# Patient Record
Sex: Male | Born: 1955 | Race: White | Hispanic: No | Marital: Married | Smoking: Never smoker
Health system: Southern US, Community
[De-identification: ages and names within clinical notes are randomized; demographics above are authoritative.]

## PROBLEM LIST (undated history)

## (undated) DIAGNOSIS — E890 Postprocedural hypothyroidism: Secondary | ICD-10-CM

## (undated) DIAGNOSIS — I712 Thoracic aortic aneurysm, without rupture, unspecified: Secondary | ICD-10-CM

## (undated) DIAGNOSIS — J189 Pneumonia, unspecified organism: Secondary | ICD-10-CM

## (undated) DIAGNOSIS — I10 Essential (primary) hypertension: Secondary | ICD-10-CM

## (undated) DIAGNOSIS — I639 Cerebral infarction, unspecified: Secondary | ICD-10-CM

## (undated) DIAGNOSIS — K5909 Other constipation: Secondary | ICD-10-CM

## (undated) DIAGNOSIS — E119 Type 2 diabetes mellitus without complications: Secondary | ICD-10-CM

## (undated) DIAGNOSIS — N201 Calculus of ureter: Secondary | ICD-10-CM

## (undated) DIAGNOSIS — M1990 Unspecified osteoarthritis, unspecified site: Secondary | ICD-10-CM

## (undated) DIAGNOSIS — Z86010 Personal history of colonic polyps: Secondary | ICD-10-CM

## (undated) DIAGNOSIS — I219 Acute myocardial infarction, unspecified: Secondary | ICD-10-CM

## (undated) DIAGNOSIS — R000 Tachycardia, unspecified: Secondary | ICD-10-CM

## (undated) DIAGNOSIS — T7840XA Allergy, unspecified, initial encounter: Secondary | ICD-10-CM

## (undated) DIAGNOSIS — R0789 Other chest pain: Secondary | ICD-10-CM

## (undated) DIAGNOSIS — E785 Hyperlipidemia, unspecified: Secondary | ICD-10-CM

## (undated) DIAGNOSIS — J3800 Paralysis of vocal cords and larynx, unspecified: Secondary | ICD-10-CM

## (undated) DIAGNOSIS — J302 Other seasonal allergic rhinitis: Secondary | ICD-10-CM

## (undated) DIAGNOSIS — N189 Chronic kidney disease, unspecified: Secondary | ICD-10-CM

## (undated) HISTORY — PX: UPPER GASTROINTESTINAL ENDOSCOPY: SHX188

## (undated) HISTORY — DX: Unspecified osteoarthritis, unspecified site: M19.90

## (undated) HISTORY — DX: Hyperlipidemia, unspecified: E78.5

## (undated) HISTORY — DX: Chronic kidney disease, unspecified: N18.9

## (undated) HISTORY — DX: Personal history of colonic polyps: Z86.010

## (undated) HISTORY — PX: OTHER SURGICAL HISTORY: SHX169

## (undated) HISTORY — DX: Other chest pain: R07.89

## (undated) HISTORY — PX: COLONOSCOPY W/ POLYPECTOMY: SHX1380

## (undated) HISTORY — DX: Thoracic aortic aneurysm, without rupture: I71.2

## (undated) HISTORY — DX: Thoracic aortic aneurysm, without rupture, unspecified: I71.20

## (undated) HISTORY — DX: Type 2 diabetes mellitus without complications: E11.9

## (undated) HISTORY — DX: Paralysis of vocal cords and larynx, unspecified: J38.00

## (undated) HISTORY — DX: Essential (primary) hypertension: I10

## (undated) HISTORY — DX: Postprocedural hypothyroidism: E89.0

## (undated) HISTORY — DX: Tachycardia, unspecified: R00.0

## (undated) HISTORY — PX: LASIK: SHX215

## (undated) HISTORY — DX: Acute myocardial infarction, unspecified: I21.9

## (undated) HISTORY — DX: Calculus of ureter: N20.1

## (undated) HISTORY — DX: Allergy, unspecified, initial encounter: T78.40XA

## (undated) HISTORY — DX: Other seasonal allergic rhinitis: J30.2

## (undated) HISTORY — DX: Other constipation: K59.09

## (undated) HISTORY — PX: EYE SURGERY: SHX253

---

## 1978-02-14 DIAGNOSIS — R000 Tachycardia, unspecified: Secondary | ICD-10-CM

## 1978-02-14 HISTORY — PX: CARDIAC CATHETERIZATION: SHX172

## 1978-02-14 HISTORY — DX: Tachycardia, unspecified: R00.0

## 2002-09-02 ENCOUNTER — Encounter: Admission: RE | Admit: 2002-09-02 | Discharge: 2002-09-02 | Payer: Self-pay | Admitting: Internal Medicine

## 2002-09-02 ENCOUNTER — Encounter: Payer: Self-pay | Admitting: Internal Medicine

## 2003-03-27 ENCOUNTER — Encounter: Payer: Self-pay | Admitting: Internal Medicine

## 2003-04-07 ENCOUNTER — Encounter: Payer: Self-pay | Admitting: Internal Medicine

## 2003-04-07 ENCOUNTER — Encounter: Admission: RE | Admit: 2003-04-07 | Discharge: 2003-04-07 | Payer: Self-pay | Admitting: Internal Medicine

## 2003-04-21 ENCOUNTER — Encounter: Payer: Self-pay | Admitting: Internal Medicine

## 2003-04-21 ENCOUNTER — Encounter: Admission: RE | Admit: 2003-04-21 | Discharge: 2003-04-21 | Payer: Self-pay | Admitting: Internal Medicine

## 2004-05-18 ENCOUNTER — Ambulatory Visit: Payer: Self-pay | Admitting: Internal Medicine

## 2005-04-27 ENCOUNTER — Ambulatory Visit: Payer: Self-pay | Admitting: Internal Medicine

## 2009-05-05 ENCOUNTER — Ambulatory Visit: Payer: Self-pay | Admitting: Internal Medicine

## 2009-05-05 DIAGNOSIS — N419 Inflammatory disease of prostate, unspecified: Secondary | ICD-10-CM | POA: Insufficient documentation

## 2009-05-05 DIAGNOSIS — M545 Low back pain, unspecified: Secondary | ICD-10-CM | POA: Insufficient documentation

## 2009-05-05 DIAGNOSIS — Z8719 Personal history of other diseases of the digestive system: Secondary | ICD-10-CM | POA: Insufficient documentation

## 2009-05-05 DIAGNOSIS — R319 Hematuria, unspecified: Secondary | ICD-10-CM | POA: Insufficient documentation

## 2009-05-05 LAB — CONVERTED CEMR LAB
Nitrite: NEGATIVE
Specific Gravity, Urine: 1.025
Urobilinogen, UA: 0.2
WBC Urine, dipstick: NEGATIVE

## 2009-05-11 LAB — CONVERTED CEMR LAB
Basophils Relative: 6.2 % — ABNORMAL HIGH (ref 0.0–3.0)
Hemoglobin: 14.1 g/dL (ref 13.0–17.0)
Lymphocytes Relative: 52.2 % — ABNORMAL HIGH (ref 12.0–46.0)
MCHC: 34.1 g/dL (ref 30.0–36.0)
MCV: 94.3 fL (ref 78.0–100.0)
Monocytes Absolute: 0.6 10*3/uL (ref 0.1–1.0)
Neutro Abs: 1 10*3/uL — ABNORMAL LOW (ref 1.4–7.7)
Neutrophils Relative %: 24.5 % — ABNORMAL LOW (ref 43.0–77.0)
PSA: 1.03 ng/mL (ref 0.10–4.00)
RBC: 4.39 M/uL (ref 4.22–5.81)
RDW: 12.5 % (ref 11.5–14.6)

## 2009-05-14 ENCOUNTER — Ambulatory Visit: Payer: Self-pay | Admitting: Internal Medicine

## 2009-05-14 DIAGNOSIS — K921 Melena: Secondary | ICD-10-CM | POA: Insufficient documentation

## 2009-05-14 LAB — CONVERTED CEMR LAB
OCCULT 1: POSITIVE
OCCULT 3: NEGATIVE

## 2009-05-15 ENCOUNTER — Encounter (INDEPENDENT_AMBULATORY_CARE_PROVIDER_SITE_OTHER): Payer: Self-pay | Admitting: *Deleted

## 2009-06-17 ENCOUNTER — Encounter (INDEPENDENT_AMBULATORY_CARE_PROVIDER_SITE_OTHER): Payer: Self-pay | Admitting: *Deleted

## 2009-06-17 ENCOUNTER — Ambulatory Visit: Payer: Self-pay | Admitting: Internal Medicine

## 2009-07-02 ENCOUNTER — Ambulatory Visit: Payer: Self-pay | Admitting: Internal Medicine

## 2009-07-02 DIAGNOSIS — Z86010 Personal history of colonic polyps: Secondary | ICD-10-CM

## 2009-07-02 DIAGNOSIS — Z860101 Personal history of adenomatous and serrated colon polyps: Secondary | ICD-10-CM

## 2009-07-02 HISTORY — PX: COLONOSCOPY W/ POLYPECTOMY: SHX1380

## 2009-07-02 HISTORY — DX: Personal history of colonic polyps: Z86.010

## 2009-07-02 HISTORY — DX: Personal history of adenomatous and serrated colon polyps: Z86.0101

## 2009-07-02 HISTORY — PX: ESOPHAGOGASTRODUODENOSCOPY: SHX1529

## 2009-07-09 ENCOUNTER — Encounter: Payer: Self-pay | Admitting: Internal Medicine

## 2010-03-18 NOTE — Procedures (Signed)
Summary: Upper Endoscopy  Patient: Timothy Page Note: All result statuses are Final unless otherwise noted.  Tests: (1) Upper Endoscopy (EGD)   EGD Upper Endoscopy       DONE     [facility]     [address]     [place], [place]  [address]           ENDOSCOPY PROCEDURE REPORT           PATIENT:  [name], Gains  MR#:  [id number]     BIRTHDATE:  May 13, 1955, 53 yrs. old  GENDER:  male           ENDOSCOPIST:  Iva Boop, MD, [facility]     Referred by:  Marga Melnick, M.D.           PROCEDURE DATE:  07/02/2009     PROCEDURE:  EGD, diagnostic     ASA CLASS:  Class I     INDICATIONS:  FOBT + stool, melena Transient melena by history     then heme + stool found.           MEDICATIONS:   Fentanyl 50 mcg IV, Versed 5 mg IV     TOPICAL ANESTHETIC:  Exactacain Spray           DESCRIPTION OF PROCEDURE:   After the risks benefits and     alternatives of the procedure were thoroughly explained, informed     consent was obtained.  The LB GIF-H180 [id number] endoscope was     introduced through the mouth and advanced to the second portion of     the duodenum, without limitations.  The instrument was slowly     withdrawn as the mucosa was fully examined.     <<PROCEDUREIMAGES>>           The upper, middle, and distal third of the esophagus were     carefully inspected and no abnormalities were noted. The z-line     was well seen at the GEJ. The endoscope was pushed into the fundus     which was normal including a retroflexed view. The antrum,gastric     body, first and second part of the duodenum were unremarkable.     Retroflexed views revealed no abnormalities.    The scope was then     withdrawn from the patient and the procedure completed.           COMPLICATIONS:  None           ENDOSCOPIC IMPRESSION:     1) Normal EGD     RECOMMENDATIONS:     colonoscopy next           REPEAT EXAM:  In for as needed.           Iva Boop, MD, [facility]           CC:  Pecola Lawless, MD     The Patient           n.     Rosalie Doctor:   Iva Boop at 07/02/2009 03:22 PM           [name], [id number]  Note: An exclamation mark (!) indicates a result that was not dispersed into the flowsheet. Document Creation Date: 07/02/2009 5:08 PM _______________________________________________________________________  (1) Order result status: Final Collection or observation date-time: 07/02/2009 14:56 Requested date-time:  Receipt date-time:  Reported date-time:  Referring Physician:   Ordering Physician: Stan Head [phone]) Specimen Source:  Source: Launa Grill Order Number: [id number] Lab site:

## 2010-03-18 NOTE — Assessment & Plan Note (Signed)
Summary: HX OF MELENA/POS STOOLS/YF   History of Present Illness Visit Type: Initial Consult Primary GI MD: Stan Head MD [facility] Primary Provider: Marga Melnick, MD Requesting Provider: Marga Melnick, MD Chief Complaint: hx of melena History of Present Illness:   55 yo white man with transient dark stool and also had blood in his urine. Subsequent 1/6 hemoccult cards were +. One stool that was "like tar" in March, had probably been using Aleve, 4-5 in a week. He has a "bad shoulder", right, chronic issue related to softball throwing over many years. also with other arthritic joints.   GI Review of Systems      Denies abdominal pain, acid reflux, belching, bloating, chest pain, dysphagia with liquids, dysphagia with solids, heartburn, loss of appetite, nausea, vomiting, vomiting blood, weight loss, and  weight gain.      Reports black tarry stools and  heme positive stool.     Denies anal fissure, change in bowel habit, constipation, diarrhea, diverticulosis, fecal incontinence, hemorrhoids, irritable bowel syndrome, jaundice, light color stool, liver problems, rectal bleeding, and  rectal pain.    Current Medications (verified): 1)  Claritin 10 Mg Tabs (Loratadine) .... As Needed 2)  Aleve 220 Mg Tabs (Naproxen Sodium) .... As Needed  Allergies (verified): 1)  ! Tetracycline  Past History:  Past Medical History: Back traction 1979 post strain; Tachycardia 1985, Dr Caprice Kluver; MRSA neck area Arrhythmia Arthritis Urinary Tract Infection  Past Surgical History: Reviewed history from 05/05/2009 and no changes required. Colonoscopy negative ? 2001  Family History: Reviewed history from 05/05/2009 and no changes required. Father: hematuria, arthritis,DJD, skin CA,DM, ? TURP Mother: bone CA, DM, arthtitis, COAD, lung CA Siblings: neg  Social History: Occupation: Production designer, theatre/television/film Married Never Smoked Alcohol use-no Regular exercise-no Daily Caffeine Use -2  Review of Systems       The patient complains of allergy/sinus, arthritis/joint pain, blood in urine, heart rhythm changes, and night sweats.         All other ROS negative except as per HPI.   Vital Signs:  Patient profile:   55 year old male Height:      69.5 inches Weight:      252.50 pounds BMI:     36.89 Pulse rate:   80 / minute Pulse rhythm:   regular BP sitting:   156 / 94  (left arm) Cuff size:   regular  Vitals Entered By: June McMurray CMA Duncan Dull) (Jun 17, 2009 3:06 PM)  Physical Exam  General:  obese NAD Eyes:  PERRLA, no icterus. Mouth:  No deformity or lesions, dentition normal. Neck:  Supple; no masses or thyromegaly. Lungs:  Clear throughout to auscultation. Heart:  Regular rate and rhythm; no murmurs, rubs,  or bruits. Abdomen:  Bowel sounds positive,abdomen soft and non-tender without masses, organomegal. Obese, moderate ventral hernia and diastisis recti. Rectal:  deferred until time of colonoscopy.   Msk:  osteoarthritis changes Extremities:  No clubbing, cyanosis, edema or deformities noted. Cervical Nodes:  No significant cervical or supraclavicular adenopathy.  Psych:  Alert and cooperative. Normal mood and affect.   Impression & Recommendations:  Problem # 1:  HEMOCCULT POSITIVE STOOL (ICD-578.1) Assessment Comment Only New to me: Need to look for colorectal neoplasia or other causes of colonic bleeding as well as ecaluate upper GI tract due to NSAID use. Risks, benefits,and indications of endoscopic procedure(s) were reviewed with the patient and all questions answered. Hgb NL  Orders: Colon/Endo (Colon/Endo)  Problem # 2:  MELENA, HX  OF (ICD-V12.79) Assessment: Comment Only New to me. Indications same as for heme + stool. Orders: Colon/Endo (Colon/Endo)  Problem # 3:  LONG-TERM USE NON-STEROIDAL ANTI-INFLAMMATORIES (ICD-V58.64)  Patient Instructions: 1)  Please pick up your medications at your pharmacy. MOVIPREP 2)  We will see you at your procedure on  07/02/09. 3)   Endoscopy Center Patient Information Guide given to patient.  4)  Colonoscopy and Flexible Sigmoidoscopy brochure given.  5)  Upper Endoscopy brochure given.  6)  Copy sent to : Marga Melnick, MD 7)  The medication list was reviewed and reconciled.  All changed / newly prescribed medications were explained.  A complete medication list was provided to the patient / caregiver. Prescriptions: MOVIPREP 100 GM  SOLR (PEG-KCL-NACL-NASULF-NA ASC-C) As per prep instructions.  #1 x 0   Entered by:   Francee Piccolo CMA (AAMA)   Authorized by:   Iva Boop MD, [facility]   Signed by:   Francee Piccolo CMA (AAMA) on 06/17/2009   Method used:   Electronically to        [id number] (retail)       [address] [place], [place]  [address]       Ph: [phone]       Fax: [phone]   RxID:   [id number]

## 2010-03-18 NOTE — Assessment & Plan Note (Signed)
Summary: uti?/kdc   Vital Signs:  Patient profile:   55 year old male Weight:      254 pounds Temp:     98.7 degrees F oral Pulse rate:   72 / minute Resp:     16 per minute BP sitting:   128 / 84  (left arm) Cuff size:   large  Vitals Entered By: Shonna Chock (May 05, 2009 1:48 PM) CC: ? UTI-blood in urine and lower back pain x 2-3 days Comments REVIEWED MED LIST, PATIENT AGREED DOSE AND INSTRUCTION CORRECT    CC:  ? UTI-blood in urine and lower back pain x 2-3 days.  History of Present Illness: Intermittent painless hematuria with malordorous urine for 2-3 days. PMH of similar picture in 2004  which resolved with antibiotics.LBP since 05/02/2009 after cleaning out gutter sitting on roof.FH & PMH of UTIs. PMH of back injury  after lifting chain link fence @ 21. [name] Rx: Aleve with benefit.  Preventive Screening-Counseling & Management  Alcohol-Tobacco     Smoking Status: never  Caffeine-Diet-Exercise     Does Patient Exercise: no  Allergies (verified): 1)  ! Tetracycline  Past History:  Past Medical History: Back traction 1979 post strain; Tachycardia 1985, Dr Caprice Kluver; MRSA neck area  Past Surgical History: Colonoscopy negative ? 2001  Family History: Father: hematuria, arthritis,DJD, skin CA,DM, ? TURP Mother: bone CA, DM, arthtitis, COAD, lung CA Siblings: neg  Social History: Occupation: Production designer, theatre/television/film Married Never Smoked Alcohol use-no Regular exercise-no Smoking Status:  never Does Patient Exercise:  no  Review of Systems General:  Complains of sweats; denies chills and fever. ENT:  Denies nosebleeds. Resp:  Denies coughing up blood. GI:  Complains of dark tarry stools; denies abdominal pain, bloody stools, and indigestion; ? melena 3 weeks ago. GU:  Denies discharge, dysuria, urinary frequency, and urinary hesitancy; Nocturia 1-2X. Derm:  Denies lesion(s) and rash. Heme:  Denies abnormal bruising and bleeding.  Physical Exam  General:  in no acute  distress; alert,appropriate and cooperative throughout examination Lungs:  Normal respiratory effort, chest expands symmetrically. Lungs are clear to auscultation, no crackles or wheezes. Abdomen:  Bowel sounds positive,abdomen soft and non-tender without masses, organomegaly or hernias noted. Rectal:  No external abnormalities noted. Normal sphincter tone. No rectal masses or tenderness. Genitalia:  Testes bilaterally descended without nodularity, tenderness or masses. No scrotal masses or lesions. No penis lesions or urethral discharge. Prostate:  Prostate gland firm and smooth, no enlargement, nodularity, mass, asymmetry or induration, but prostate VERY TENDER. Msk:  Minimal LS tenderness to percussion Skin:  Intact without suspicious lesions or rashes;flat keratosis LLQ 14X 8 mm Inguinal Nodes:  No significant adenopathy Psych:  memory intact for recent and remote, normally interactive, and good eye contact.     Impression & Recommendations:  Problem # 1:  HEMATURIA (ICD-599.70)  intermittent  Orders: Venipuncture ([address])  His updated medication list for this problem includes:    Ciprofloxacin Hcl 500 Mg Tabs (Ciprofloxacin hcl) .[name]... 1 two times a day  Problem # 2:  LOW BACK PAIN, ACUTE (ICD-724.2)  probably from working on roof while sitting  Orders: Venipuncture ([address])  Problem # 3:  UNSPECIFIED PROSTATITIS (ICD-601.9)  clinically w/o enlargement  Orders: Venipuncture ([address]) TLB-CBC Platelet - w/Differential (85025-CBCD) TLB-PSA (Prostate Specific Antigen) (84153-PSA)  Problem # 4:  MELENA, HX OF (ICD-V12.79)  2-3 weeks ago  Orders: Venipuncture ([address]) TLB-CBC Platelet - w/Differential (85025-CBCD)  Complete Medication List: 1)  Ciprofloxacin Hcl 500 Mg Tabs (Ciprofloxacin  hcl) .... 1 two times a day  Other Orders: UA Dipstick w/o Micro (manual) ([address])  Patient Instructions: 1)  Complete stool cards 2)  Drink as much fluid as you can tolerate for the  next few days. Call if you change your mind about pain meds. Prescriptions: CIPROFLOXACIN HCL 500 MG TABS (CIPROFLOXACIN HCL) 1 two times a day  #14 x 0   Entered and Authorized by:   Marga Melnick MD   Signed by:   Marga Melnick MD on 05/05/2009   Method used:   Faxed to ...       [facility] [address] [id number]* (retail)       [address] [place], [place]  [address]       Ph: [phone]       Fax: [phone]   RxID:   [id number]   Laboratory Results   Urine Tests    Routine Urinalysis   Color: straw Appearance: Clear Glucose: negative   (Normal Range: Negative) Bilirubin: negative   (Normal Range: Negative) Ketone: negative   (Normal Range: Negative) Spec. Gravity: 1.025   (Normal Range: 1.003-1.035) Blood: negative   (Normal Range: Negative) pH: 6.0   (Normal Range: 5.0-8.0) Protein: negative   (Normal Range: Negative) Urobilinogen: 0.2   (Normal Range: 0-1) Nitrite: negative   (Normal Range: Negative) Leukocyte Esterace: negative   (Normal Range: Negative)

## 2010-03-18 NOTE — Letter (Signed)
Summary: Patient Notice- Polyp Results  [facility]  [address] [place], [place] [address]   Phone: [phone]  Fax: [phone]        Jul 09, 2009 MRN: [id number]    Timothy Page [address] [place], [place]  [address]    Dear 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 This means that it was pre-cancerous or that  it had the potential to change into cancer over time.  I recommend that you have a repeat colonoscopy in 5 years to determine if you have developed any new polyps over time. If you develop any new rectal bleeding, abdominal pain or significant bowel habit changes, please contact us before then.  Please call us if you are having persistent problems or have questions about your condition that have not been fully answered at this time.  Sincerely,  Iva Boop MD, [facility]  This letter has been electronically signed by your physician.  Appended Document: Patient Notice- Polyp Results letter mailed.

## 2010-03-18 NOTE — Letter (Signed)
Summary: [name] Instructions  [name]  [address] [place], [place] [address]   Phone: [phone]  Fax: [phone]       Timothy Page    [birth year]    MRN: [id number]      Procedure Day Timothy Page: Timothy Page, 07/02/09     Arrival Time: 1:30 PM      Procedure Time: 2:30 PM    Location of Procedure:                    _X_  [facility] (4th Floor)  PREPARATION FOR COLONOSCOPY WITH MOVIPREP   Starting 5 days prior to your procedure 06/27/09 do not eat nuts, seeds, popcorn, corn, beans, peas,  salads, or any raw vegetables.  Do not take any fiber supplements (e.g. Metamucil, Citrucel, and Benefiber).  THE DAY BEFORE YOUR PROCEDURE         WEDNESDAY, 07/01/09  1.  Drink clear liquids the entire day-NO SOLID FOOD  2.  Do not drink anything colored red or purple.  Avoid juices with pulp.  No orange juice.  3.  Drink at least 64 oz. (8 glasses) of fluid/clear liquids during the day to prevent dehydration and help the prep work efficiently.  CLEAR LIQUIDS INCLUDE: Water Jello Ice Popsicles Tea (sugar ok, no milk/cream) Powdered fruit flavored drinks Coffee (sugar ok, no milk/cream) Gatorade Juice: apple, white grape, white cranberry  Lemonade Clear bullion, consomm, broth Carbonated beverages (any kind) Strained chicken noodle soup Hard Candy                             4.  In the morning, mix first dose of MoviPrep solution:    Empty 1 Pouch A and 1 Pouch B into the disposable container    Add lukewarm drinking water to the top line of the container. Mix to dissolve    Refrigerate (mixed solution should be used within 24 hrs)  5.  Begin drinking the prep at 5:00 p.m. The MoviPrep container is divided by 4 marks.   Every 15 minutes drink the solution down to the next mark (approximately 8 oz) until the full liter is complete.   6.  Follow completed prep with 16 oz of clear liquid of your choice (Nothing red or purple).  Continue to drink  clear liquids until bedtime.    7.  Before going to bed, mix second dose of MoviPrep solution:    Empty 1 Pouch A and 1 Pouch B into the disposable container    Add lukewarm drinking water to the top line of the container. Mix to dissolve    Refrigerate  THE DAY OF YOUR PROCEDURE      THURSDAY, 07/02/09  Beginning at 9:30a.m. (5 hours before procedure):         1. Every 15 minutes, drink the solution down to the next mark (approx 8 oz) until the full liter is complete.  2. Follow completed prep with 16 oz. of clear liquid of your choice.    3. You may drink clear liquids until 12:30 PM (2 HOURS BEFORE PROCEDURE).  MEDICATION INSTRUCTIONS  Unless otherwise instructed, you should take regular prescription medications with a small sip of water   as early as possible the morning of your procedure.       OTHER INSTRUCTIONS  You will need a responsible adult at least 54 years of age to accompany you and drive you home.  This person must remain in the waiting room during your procedure.  Wear loose fitting clothing that is easily removed.  Leave jewelry and other valuables at home.  However, you may wish to bring a book to read or  an iPod/MP3 player to listen to music as you wait for your procedure to start.  Remove all body piercing jewelry and leave at home.  Total time from sign-in until discharge is approximately 2-3 hours.  You should go home directly after your procedure and rest.  You can resume normal activities the  day after your procedure.  The day of your procedure you should not:   Drive   Make legal decisions   Operate machinery   Drink alcohol   Return to work  You will receive specific instructions about eating, activities and medications before you leave.  The above instructions have been reviewed and explained to me by   _______________________  I fully understand and can verbalize these instructions _____________________________ Date  _________

## 2010-03-18 NOTE — Letter (Signed)
Summary: Primary Care Consult Scheduled Letter  [facility]  [address] [place], [place] [address]   Phone: [phone]  Fax: [phone]      05/15/2009 MRN: [id number]  Timothy Page [address] [place], [place]  [address]    Dear Mr. Soltau,    We have scheduled an appointment for you.  At the recommendation of Dr. Marga Melnick, we have scheduled you a consult with Dr. Stan Head of [facility] on 06-17-2009 at 2:45pm.  Their address is [address] 3rd floor, [place] [address]. The office phone number is [phone].  If this appointment day and time is not convenient for you, please feel free to call the office of the doctor you are being referred to at the number listed above and reschedule the appointment.    It is important for you to keep your scheduled appointments. We are here to make sure you are given good patient care.   Thank you,    Renee, Patient Care Coordinator [facility]    ****IF YOU ARE UNABLE TO KEEP THIS APPOINTMENT, OR NEED TO RESCHEDULE, PLEASE GIVE DR. Marvell Fuller OFFICE A 24 HOUR NOTICE TO AVOID A $50 FEE****

## 2010-03-18 NOTE — Procedures (Signed)
Summary: Colonoscopy  Patient: Timothy Page Note: All result statuses are Final unless otherwise noted.  Tests: (1) Colonoscopy (COL)   COL Colonoscopy           DONE      [facility]     [address]     [place], [place]  [address]           COLONOSCOPY PROCEDURE REPORT           PATIENT:  Timothy Page, Timothy Page  MR#:  [id number]     BIRTHDATE:  February 06, 1956, 53 yrs. old  GENDER:  male     ENDOSCOPIST:  Iva Boop, MD, [facility]     REF. BY:          Marga Melnick, MD     PROCEDURE DATE:  07/02/2009     PROCEDURE:  Colonoscopy with snare polypectomy     ASA CLASS:  Class I     INDICATIONS:  FOBT positive stool, melena transient melena by     history then heme + stool     MEDICATIONS:   There was residual sedation effect present from     prior procedure., Fentanyl 50 mcg, Versed 3 mg IV           DESCRIPTION OF PROCEDURE:   After the risks benefits and     alternatives of the procedure were thoroughly explained, informed     consent was obtained.  Digital rectal exam was performed and     revealed no abnormalities and normal prostate.   The LB CF-H180AL     [id number] endoscope was introduced through the anus and advanced to     the cecum, which was identified by both the appendix and ileocecal     valve, without limitations.  The quality of the prep was     excellent, using MoviPrep.  The instrument was then slowly     withdrawn as the colon was fully examined.     Insertion: 2:44 minutes Withdrawal: 8:58 minutes     <<PROCEDUREIMAGES>>           FINDINGS:  The terminal ileum appeared normal.  A sessile polyp     was found in the sigmoid colon. It was 6 mm in size.  This was     otherwise a normal examination of the colon.   Retroflexed views     in the rectum revealed no abnormalities.    The scope was then     withdrawn from the patient and the procedure completed.           COMPLICATIONS:  None     ENDOSCOPIC IMPRESSION:     1) Normal terminal ileum     2) 6 mm sessile  polyp in the sigmoid colon - removed     3) Otherwise normal examination, excellent prep. No cause of     bleeding seen. I suspect the suspected transient melena was     related to NSAID use and so was heme + stool.           REPEAT EXAM:  In for Colonoscopy, pending biopsy results. If     further episodes of melena would then consider small bowel capsule     endoscopy.           Iva Boop, MD, [facility]           CC:  Pecola Lawless, MD     The Patient  n.     eSIGNED:   Iva Boop at 07/02/2009 03:30 PM           [name], [id number]  Note: An exclamation mark (!) indicates a result that was not dispersed into the flowsheet. Document Creation Date: 07/02/2009 5:08 PM _______________________________________________________________________  (1) Order result status: Final Collection or observation date-time: 07/02/2009 15:17 Requested date-time:  Receipt date-time:  Reported date-time:  Referring Physician:   Ordering Physician: Stan Head [id number]) Specimen Source:  Source: Launa Grill Order Number: [id number] Lab site:   Appended Document: Colonoscopy     Procedures Next Due Date:    Colonoscopy: 07/2014

## 2010-03-18 NOTE — Letter (Signed)
Summary: New Patient letter  [facility]  [address] [place], [place] [address]   Phone: [phone]  Fax: [phone]       05/15/2009 MRN: [id number]  Timothy Page [address] [place], [place]  [address]  Dear Mr. Lanius,  Welcome to the Gastroenterology Division at [facility].    You are scheduled to see Dr.  Leone Payor  on May 4th, 2011 at 2:45pm on the 3rd floor at [facility], [address]  We ask that you try to arrive at our office 15 minutes prior to your appointment time to allow for check-in.  We would like you to complete the enclosed self-administered evaluation form prior to your visit and bring it with you on the day of your appointment.  We will review it with you.  Also, please bring a complete list of all your medications or, if you prefer, bring the medication bottles and we will list them.  Please bring your insurance card so that we may make a copy of it.  If your insurance requires a referral to see a specialist, please bring your referral form from your primary care physician.  Co-payments are due at the time of your visit and may be paid by cash, check or credit card.     Your office visit will consist of a consult with your physician (includes a physical exam), any laboratory testing he/she may order, scheduling of any necessary diagnostic testing (e.g. x-ray, ultrasound, CT-scan), and scheduling of a procedure (e.g. Endoscopy, Colonoscopy) if required.  Please allow enough time on your schedule to allow for any/all of these possibilities.    If you cannot keep your appointment, please call [phone] to cancel or reschedule prior to your appointment date.  This allows [place] the opportunity to schedule an appointment for another patient in need of care.  If you do not cancel or reschedule by 5 p.m. the business day prior to your appointment date, you will be charged a $50.00 late cancellation/no-show fee.    Thank you for choosing  [facility] for your medical needs.  We appreciate the opportunity to care for you.  Please visit [place] at our website  to learn more about our practice.                     Sincerely,                                                             The Gastroenterology Division

## 2010-03-18 NOTE — Progress Notes (Signed)
Summary: [facility]   Imported By: Lanelle Bal 05/11/2009 10:46:31  _____________________________________________________________________  External Attachment:    Type:   Image     Comment:   External Document

## 2011-03-01 ENCOUNTER — Encounter: Payer: Self-pay | Admitting: Internal Medicine

## 2011-03-01 ENCOUNTER — Ambulatory Visit (INDEPENDENT_AMBULATORY_CARE_PROVIDER_SITE_OTHER)
Admission: RE | Admit: 2011-03-01 | Discharge: 2011-03-01 | Disposition: A | Discharge status: Home / Self Care | Payer: 59 | Source: Ambulatory Visit | Attending: Internal Medicine | Admitting: Internal Medicine

## 2011-03-01 ENCOUNTER — Ambulatory Visit (INDEPENDENT_AMBULATORY_CARE_PROVIDER_SITE_OTHER): Payer: 59 | Admitting: Internal Medicine

## 2011-03-01 DIAGNOSIS — R0789 Other chest pain: Secondary | ICD-10-CM

## 2011-03-01 DIAGNOSIS — R030 Elevated blood-pressure reading, without diagnosis of hypertension: Secondary | ICD-10-CM

## 2011-03-01 DIAGNOSIS — R251 Tremor, unspecified: Secondary | ICD-10-CM | POA: Insufficient documentation

## 2011-03-01 DIAGNOSIS — D179 Benign lipomatous neoplasm, unspecified: Secondary | ICD-10-CM | POA: Insufficient documentation

## 2011-03-01 DIAGNOSIS — R071 Chest pain on breathing: Secondary | ICD-10-CM

## 2011-03-01 DIAGNOSIS — IMO0001 Reserved for inherently not codable concepts without codable children: Secondary | ICD-10-CM | POA: Insufficient documentation

## 2011-03-01 DIAGNOSIS — R259 Unspecified abnormal involuntary movements: Secondary | ICD-10-CM

## 2011-03-01 NOTE — Assessment & Plan Note (Signed)
Lipoma in the back, currently symptomatic. Refer to surgery for possible excision. He also has some pain in the posterior chest, distal from the lipoma, see graphic, we'll get a chest x-ray

## 2011-03-01 NOTE — Assessment & Plan Note (Addendum)
Right hand action tremor, no other muscle groups involved. Essential tremor? Parkinsonism?.others? Also question of R sided face-arm weakness. Tremor is interfering with writing. Will refer to neurology.

## 2011-03-01 NOTE — Progress Notes (Signed)
  Subjective:    Patient ID: Timothy Page, male    DOB: 01-Jun-1955, 55 y.o.   MRN: [id number]  HPI Acute visit, several issues. 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 He developed severe tremor with action. Left hand is unaffected. I noted his BP to be elevated today, he reports that he take his BP regularly at home and it is normal.   Past Medical History: Back traction 1979 post strain Tachycardia 1985, Dr Mindi Junker Little MRSA neck area Arthritis Urinary Tract Infection  Past Surgical History: Colonoscopy 2011  Social History Tobacco--no ETOH--no Caffeine-- 1 cup of coffee a day Review of Systems     Objective:   Physical Exam  Constitutional: He appears well-developed and well-nourished.  HENT:  Head: Normocephalic and atraumatic.  Cardiovascular: Normal rate, regular rhythm and normal heart sounds.   No murmur heard. Pulmonary/Chest: Breath sounds normal. No respiratory distress. He has no wheezes. He has no rales.  Musculoskeletal:       Arms: Neurological:       Speech is fluent, alert oriented x3; Motor exam symmetric except for mild right arm weakness (from pain?) and a question of right facial weakness. No resting tremor, moderate to severe action tremor on the right hand only, writing is very difficult. No head or lip tremor. Voice is normal. DTRs symmetric, question of decreased DTRs in the right lower extremity.  Psychiatric: He has a normal mood and affect. His behavior is normal. Judgment and thought content normal.          Assessment & Plan:

## 2011-03-01 NOTE — Patient Instructions (Signed)
Check the  blood pressure 2 or 3 times a week, be sure it is less than 140/85. If it is consistently higher, let me know It is extremely important that you come back and see Dr. Consuela Mimes for a complete physical exam. We need to be sure you're in good health and  your blood pressure is normal. Please schedule a physical at your earliest convenience

## 2011-03-01 NOTE — Assessment & Plan Note (Signed)
Elevated BP today, strongly recommend to continue checking his BP and to visit his PCP. See instructions

## 2011-03-02 ENCOUNTER — Encounter: Payer: Self-pay | Admitting: Neurology

## 2011-03-04 ENCOUNTER — Telehealth: Payer: Self-pay

## 2011-03-04 NOTE — Telephone Encounter (Signed)
Message copied by Maurice Small on Fri Mar 04, 2011 11:07 AM ------      Message from: Willow Ora E      Created: Thu Mar 03, 2011  7:40 PM       Advise patient:      CXR normal

## 2011-03-04 NOTE — Telephone Encounter (Signed)
Left message on voicemail with results, letter mailed to patient also. Patient to call with any questions or concerns

## 2011-04-07 ENCOUNTER — Ambulatory Visit: Payer: 59 | Admitting: Neurology

## 2011-04-08 ENCOUNTER — Ambulatory Visit (INDEPENDENT_AMBULATORY_CARE_PROVIDER_SITE_OTHER): Payer: 59 | Admitting: Neurology

## 2011-04-08 ENCOUNTER — Encounter: Payer: Self-pay | Admitting: Neurology

## 2011-04-08 VITALS — BP 140/100 | HR 76 | Wt 244.0 lb

## 2011-04-08 DIAGNOSIS — R251 Tremor, unspecified: Secondary | ICD-10-CM

## 2011-04-08 DIAGNOSIS — R259 Unspecified abnormal involuntary movements: Secondary | ICD-10-CM

## 2011-04-08 MED ORDER — CLONAZEPAM 0.5 MG PO TABS: ORAL_TABLET | ORAL | Status: DC

## 2011-04-08 NOTE — Progress Notes (Signed)
Dear Dr. Alwyn Ren,  Thank you for having me see Timothy Page in consultation today at [facility] for his problem with tremor.  As you may recall, he is a 56 y.o. 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  He says it has gotten worse over the last year.  It is getting in the way of his writing.  He thinks it gets worse with anxiety.  Admits to a tremor no other place.  Does get neck pain and stiffness.  Also, wife says his voice is not what it used to be.  No incontinence or falls.  Tremor does not get in the way of him feeding himself.  He does not drink alcohol so he cannot endorse whether it makes it better.  Drinks 2 cups of caffeine a day, and does not think it makes him worse.  He feels weak on the right side of the body.  He feels that it is difficult to do his job because of that weakness.    He has a lipoma in her right upper back.  He is wondering if this is causing his tremor.  Past Medical History  Diagnosis Date  . Seasonal allergies   . Lipoma     back    Past Surgical History  Procedure Date  . Lasik   . Finger amputaion     with reattachement    History   Social History  . Marital Status: Married    Spouse Name: N/A    Number of Children: N/A  . Years of Education: N/A   Social History Main Topics  . Smoking status: Never Smoker   . Smokeless tobacco: Current User    Types: Snuff  . Alcohol Use: No  . Drug Use: None  . Sexually Active: None   Other Topics Concern  . None   Social History Narrative  . None   Family Hx:  no tremor or Parkinson's disease.  Current Outpatient Prescriptions on File Prior to Visit  Medication Sig Dispense Refill  . loratadine (CLARITIN) 10 MG tablet Take 10 mg by mouth as needed.      . naproxen sodium (ANAPROX) 220 MG tablet Take 220 mg by mouth as needed.        Allergies  Allergen Reactions  . Tetracycline       ROS:  13 systems  were reviewed and are notable for neck pain.  Denies significant anxiety.  All other review of systems are unremarkable.   Examination:  Filed Vitals:   04/08/11 0904  BP: 140/100  Pulse: 76  Weight: 244 lb (110.678 kg)       In general, well appearing.   Back:  movable soft mass over right scapula, tender to touch.  H&N:  tenderness to palpation of paraspinal muscles, no rigidity.  Cardiovascular: The patient has a regular rate and rhythm and no carotid bruits.  Fundoscopy:  Disks are flat. Vessel caliber within normal limits.  Mental status:   The patient is oriented to person, place and time. Recent and remote memory are intact. Attention span and concentration are normal. Language including repetition, naming, following commands are intact. Fund of knowledge of current and historical events, as well as vocabulary are normal.  Cranial Nerves: Pupils are equally round and reactive to light. Visual fields full to confrontation. Extraocular movements are intact without nystagmus. Facial sensation and muscles of mastication are  intact. Muscles of facial expression are symmetric.  I think his blink frequency is normal. Hearing intact to bilateral finger rub. Tongue protrusion, uvula, palate midline.  Shoulder shrug intact.  No significant delay on right.  Motor:  The patient has normal bulk.  I felt his tone was normal bilaterally.  No significant bradykinesia.  There does feel to have give away strength testing on the right.  No resting tremor.  Bilateral fine tremor.  Tremor seems coarser with writing.  Reflexes:  Symmetric, quiet.  Toes down  Coordination:  Normal finger to nose.  No dysdiadokinesia.  Sensation is symmetric to light touch.  Gait and Station are normal.    Romberg is negative.  Perhaps some decreased armswing on the right, but very subtle if it is there at all.  Tremor seems exaggerated when writing -- almost seems writing specific.   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  His self reported right sided weakness is concerning, at first I thought this might be the rigidity of parkinson's disease, but I couldn't find any objective findings on exam.  Certainly he is quite disabled by this due to his writing, but I am worried there might be some underlying functionality here.  I am going to try a low dose of clonazepam to see if this helps the tremor and possible anxiety.   We will see the patient back in 6 weeks.  Thank you for having [place] see Timothy Page in consultation.  Feel free to contact me with any questions.  Lupita Raider Modesto Charon, MD [facility], [place] [address] [place], [place] [address] Phone: [phone] Fax: [phone].

## 2011-04-28 ENCOUNTER — Ambulatory Visit (INDEPENDENT_AMBULATORY_CARE_PROVIDER_SITE_OTHER): Payer: 59 | Admitting: Internal Medicine

## 2011-04-28 ENCOUNTER — Encounter: Payer: Self-pay | Admitting: Internal Medicine

## 2011-04-28 VITALS — BP 138/86 | HR 66 | Temp 98.2°F | Resp 12 | Ht 70.0 in | Wt 244.0 lb

## 2011-04-28 DIAGNOSIS — IMO0002 Reserved for concepts with insufficient information to code with codable children: Secondary | ICD-10-CM

## 2011-04-28 DIAGNOSIS — I2510 Atherosclerotic heart disease of native coronary artery without angina pectoris: Secondary | ICD-10-CM | POA: Insufficient documentation

## 2011-04-28 DIAGNOSIS — Z23 Encounter for immunization: Secondary | ICD-10-CM

## 2011-04-28 DIAGNOSIS — M5414 Radiculopathy, thoracic region: Secondary | ICD-10-CM

## 2011-04-28 DIAGNOSIS — T887XXA Unspecified adverse effect of drug or medicament, initial encounter: Secondary | ICD-10-CM | POA: Insufficient documentation

## 2011-04-28 DIAGNOSIS — Z0000 Encounter for general adult medical examination without abnormal findings: Secondary | ICD-10-CM

## 2011-04-28 DIAGNOSIS — Z136 Encounter for screening for cardiovascular disorders: Secondary | ICD-10-CM

## 2011-04-28 MED ORDER — GABAPENTIN 100 MG PO CAPS: 100.0000 mg | ORAL_CAPSULE | Freq: Three times a day (TID) | ORAL | Status: DC

## 2011-04-28 NOTE — Patient Instructions (Signed)
Assess response to the gabapentin one every 8 hours as needed. If it is partially beneficial, it can be increased up to a total of 3 pills every 8 hours as needed. This increase of 1 pill each dose  should take place over 72 hours at least. 

## 2011-04-28 NOTE — Progress Notes (Signed)
  Subjective:    Patient ID: Timothy Page, male    DOB: 11-20-1955, 56 y.o.   MRN: [id number]  HPI Timothy Page  is here for a physical;acute issues include R hand intention  tremor for which he has seen Dr Modesto Charon & Clonazepam Rxed.      Review of Systems For a month he's had intermittent knifelike pain in the right infrascapular area radiating to the posterior axillary line. He denies any associated weakness, numbness and tingling, or bowel or urinary incontinence.  Chest x-ray 03/01/11 was interpreted as negative. There is no description of the thoracic spine.   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 There is also spondylosis at T8-9 and T9-10 with small osteophytes and protruding disc material centrally and towards the right. The cord did not appear to be compressed or deformed. The findings at both levels were more pronounced to the right than the left.    Objective:   Physical Exam Gen.: Healthy and well-nourished in appearance. Alert, appropriate and cooperative throughout exam. Head: Normocephalic without obvious abnormalities; goatee ; pattern  alopecia  Eyes: No corneal or conjunctival inflammation noted. Pupils equal round reactive to light and accommodation. Fundal exam is benign without hemorrhages, exudate, papilledema. Extraocular motion intact. Vision grossly normal. Ears: External  ear exam reveals no significant lesions or deformities. Canals clear .TMs normal. Hearing is grossly normal bilaterally. Nose: External nasal exam reveals no deformity or inflammation. Nasal mucosa are pink and moist. No lesions or exudates noted.  Mouth: Oral mucosa and oropharynx reveal no lesions or exudates. Teeth in good repair. Neck: No deformities, masses, or tenderness noted. Range of motion & Thyroid normal Lungs: Normal respiratory effort; chest expands symmetrically. Lungs are clear to auscultation without rales,  wheezes, or increased work of breathing. Heart: Normal rate and rhythm. Normal S1 and S2. No gallop, click, or rub. S 4 with slurring; no murmur. Abdomen: Bowel sounds normal; abdomen soft and nontender. No masses, organomegaly or hernias noted. Genitalia/ DRE: Genital exam is unremarkable. Prostate is normal without enlargement, asymmetry, nodularity or induration. Musculoskeletal/extremities: No deformity or scoliosis noted of  the thoracic or lumbar spine. No clubbing, cyanosis, or edema noted. Range of motion  normal .Tone & strength  Normal.Minor 5th R DIP deformity & nail . Vascular: Carotid, radial artery, dorsalis pedis and  posterior tibial pulses are full and equal. No bruits present. Neurologic: Alert and oriented x3. Deep tendon reflexes symmetrical and normal.          Skin: Intact without suspicious lesions or rashes. Lipoma R upper back Lymph: No cervical, axillary, or inguinal lymphadenopathy present. Psych: Mood and affect are normal. Normally interactive                                                                                         Assessment & Plan:  #1 comprehensive physical exam; no acute findings #2 see Problem List with Assessments & Recommendations  #3 benign intention tremor of the right hand  #4 probable right-sided T5-6 radicular symptoms Plan: see Orders

## 2011-04-29 LAB — TSH: TSH: 0.76 u[IU]/mL (ref 0.35–5.50)

## 2011-04-29 LAB — CBC WITH DIFFERENTIAL/PLATELET
Basophils Relative: 0.7 % (ref 0.0–3.0)
Hemoglobin: 14.9 g/dL (ref 13.0–17.0)
Lymphocytes Relative: 36.6 % (ref 12.0–46.0)
Monocytes Relative: 4.5 % (ref 3.0–12.0)
Neutro Abs: 3.4 10*3/uL (ref 1.4–7.7)
RBC: 4.68 Mil/uL (ref 4.22–5.81)
WBC: 6 10*3/uL (ref 4.5–10.5)

## 2011-04-29 LAB — BASIC METABOLIC PANEL
Calcium: 9.3 mg/dL (ref 8.4–10.5)
GFR: 92.14 mL/min (ref >60.00)
Sodium: 141 mEq/L (ref 135–145)

## 2011-04-29 LAB — HEPATIC FUNCTION PANEL
AST: 22 U/L (ref 0–37)
Albumin: 4.5 g/dL (ref 3.5–5.2)
Alkaline Phosphatase: 90 U/L (ref 39–117)
Total Protein: 7.3 g/dL (ref 6.0–8.3)

## 2011-04-29 LAB — LIPID PANEL
Total CHOL/HDL Ratio: 6
VLDL: 38.2 mg/dL (ref 0.0–40.0)

## 2011-05-28 ENCOUNTER — Other Ambulatory Visit: Payer: Self-pay | Admitting: Internal Medicine

## 2011-06-16 ENCOUNTER — Ambulatory Visit: Payer: 59 | Admitting: Neurology

## 2011-08-22 ENCOUNTER — Ambulatory Visit: Payer: 59 | Admitting: Neurology

## 2012-07-17 ENCOUNTER — Ambulatory Visit (INDEPENDENT_AMBULATORY_CARE_PROVIDER_SITE_OTHER): Payer: 59 | Admitting: Family Medicine

## 2012-07-17 VITALS — BP 182/104 | HR 75 | Temp 98.7°F | Resp 17 | Ht 70.5 in | Wt 250.0 lb

## 2012-07-17 DIAGNOSIS — W57XXXA Bitten or stung by nonvenomous insect and other nonvenomous arthropods, initial encounter: Secondary | ICD-10-CM

## 2012-07-17 DIAGNOSIS — T148 Other injury of unspecified body region: Secondary | ICD-10-CM

## 2012-07-17 MED ORDER — CEFUROXIME AXETIL 500 MG PO TABS: 500.0000 mg | ORAL_TABLET | Freq: Two times a day (BID) | ORAL | Status: DC

## 2012-07-17 MED ORDER — PREDNISONE 50 MG PO TABS: ORAL_TABLET | ORAL | Status: DC

## 2012-07-17 NOTE — Progress Notes (Signed)
Patient is a 57 year old male coming in with a rash and potential bug bite.   Regarding the potential bug bite patient was doing yard work 3 days ago he noticed the bite on his left buttocks that evening. Patient states since that time he has had redness surrounding the area that has become somewhat tender. Patient denies any fevers or chills. Patient denies any discharge from the area. Patient did not see what type of bug bit him. Patient states though that the redness seems to be expanding.  Patient is also complaining of a rash on his arms bilaterally. Patient states that it is itching. Patient denies any discharge from the area. Patient states that it started in a couple areas and has had poison oak before and states that this does feel somewhat similar.  Past Medical History  Diagnosis Date  . Seasonal allergies   . Lipoma     back  . Heart attack     age 31 X 2 over 8 month period  . Arthritis    Past Surgical History  Procedure Laterality Date  . Lasik    . Finger amputaion      with reattachement  . Cardiac catheterization    . Eye surgery     Family History  Problem Relation Age of Onset  . Diabetes Mother   . Cancer Mother     lung  . COPD Mother   . Diabetes Father   . Cancer Father     mesothelioma; skin  . Mental illness Father   . Heart disease Maternal Grandmother     MI @ 44  . Heart disease Maternal Grandfather     MI @ 76  . Cancer Paternal Grandmother   . Heart disease Paternal Grandfather     MI @ 4   History   Social History  . Marital Status: Married    Spouse Name: N/A    Number of Children: N/A  . Years of Education: N/A   Social History Main Topics  . Smoking status: Never Smoker   . Smokeless tobacco: Current User    Types: Snuff  . Alcohol Use: No  . Drug Use: No  . Sexually Active: None   Other Topics Concern  . None   Social History Narrative  . None    Physical exam Blood pressure 182/104, pulse 75, temperature 98.7 F  (37.1 C), temperature source Oral, resp. rate 17, height 5' 10.5" (1.791 m), weight 250 lb (113.399 kg), SpO2 96.00%. General: No apparent distress alert and oriented x3 mood and affect normal Respiratory: Patient's speak in full sentences and does not appear short of breath Neuro: Cranial nerves II through XII are intact, neurovascularly intact in all extremities with 2+ DTRs and 2+ pulses. Skin exam: She does show that patient has an area what appears to be a one puncture area that does have a central clearing with significant erythema surrounding it is well demarcated. This has a very consistent picture of a potential tic bite. Patient has some maculopapular rash is very inconsistent on the arms at this time with some mild exfoliation. There is no vesicle formation at this time or we'll formation.  Assessment 1. Tick Bite 2.  ? Early poison oak or contact dermatitis.   Plan: 1. patient has a history of allergy to tetracycline as well as a intolerance to amoxicillin. Patient will try cefuroxime. Warned of potential price. Patient will take this for a total of 10 days. 2. patient given a  week and see prescription for prednisone if this does develop into a more vesicle pattern.  Patient given handout and red flags and when to seek medical attention.  Will follow up if not completely resolved in next week.

## 2012-07-17 NOTE — Patient Instructions (Signed)
Deer Tick Bite Deer ticks are brown arachnids (spider family) that vary in size from as small as the head of a pin to 1/4 inch (1/2 cm) diameter. They thrive in wooded areas. Deer are the preferred host of adult deer ticks. Small rodents are the host of young ticks (nymphs). When a person walks in a field or wooded area, young and adult ticks in the surrounding grass and vegetation can attach themselves to the skin. They can suck blood for hours to days if unnoticed. Ticks are found all over the [place] Some ticks carry a specific bacteria (Borrelia burgdorferi) that causes an infection called Lyme disease. The bacteria is typically passed into a person during the blood sucking process. This happens after the tick has been attached for at least a number of hours. While ticks can be found all over the [place] those carrying the bacteria that causes Lyme disease are most common in [place] and the [place]. Only a small proportion of ticks in these areas carry the Lyme disease bacteria and cause human infections. Ticks usually attach to warm spots on the body, such as the:  Head.  Back.  Neck.  Armpits.  Groin. SYMPTOMS  Most of the time, a deer tick bite will not be felt. You may or may not see the attached tick. You may notice mild irritation or redness around the bite site. If the deer tick passes the Lyme disease bacteria to a person, a round, red rash may be noticed 2 to 3 days after the bite. The rash may be clear in the middle, like a bull's-eye or target. If not treated, other symptoms may develop several days to weeks after the onset of the rash. These symptoms may include:  New rash lesions.  Fatigue and weakness.  General ill feeling and achiness.  Chills.  Headache and neck pain.  Swollen lymph glands.  Sore muscles and joints. 5 to 15% of untreated people with Lyme disease may develop more severe illnesses after several weeks to months. This may include inflammation of the  brain lining (meningitis), nerve palsies, an abnormal heartbeat, or severe muscle and joint pain and inflammation (myositis or arthritis). DIAGNOSIS   Physical exam and medical history.  Viewing the tick if it was saved for confirmation.  Blood tests (to check or confirm the presence of Lyme disease). TREATMENT  Most ticks do not carry disease. If found, an attached tick should be removed using tweezers. Tweezers should be placed under the body of the tick so it is removed by its attachment parts (pincers). If there are signs or symptoms of being sick, or Lyme disease is confirmed, medicines (antibiotics) that kill germs are usually prescribed. In more severe cases, antibiotics may be given through an intravenous (IV) access. HOME CARE INSTRUCTIONS   Always remove ticks with tweezers. Do not use petroleum jelly or other methods to kill or remove the tick. Slide the tweezers under the body and pull out as much as you can. If you are not sure what it is, save it in a jar and show your caregiver.  Once you remove the tick, the skin will heal on its own. Wash your hands and the affected area with water and soap. You may place a bandage on the affected area.  Take medicine as directed. You may be advised to take a full course of antibiotics.  Follow up with your caregiver as recommended. FINDING OUT THE RESULTS OF YOUR TEST Not all test results are available   during your visit. If your test results are not back during the visit, make an appointment with your caregiver to find out the results. Do not assume everything is normal if you have not heard from your caregiver or the medical facility. It is important for you to follow up on all of your test results. PROGNOSIS  If Lyme disease is confirmed, early treatment with antibiotics is very effective. Following preventive guidelines is important since it is possible to get the disease more than once. PREVENTION   Wear long sleeves and long pants in  wooded or grassy areas. Tuck your pants into your socks.  Use an insect repellent while hiking.  Check yourself, your children, and your pets regularly for ticks after playing outside.  Clear piles of leaves or brush from your yard. Ticks might live there. SEEK MEDICAL CARE IF:   You or your child has an oral temperature above 102 F (38.9 C).  You develop a severe headache following the bite.  You feel generally ill.  You notice a rash.  You are having trouble removing the tick.  The bite area has red skin or yellow drainage. SEEK IMMEDIATE MEDICAL CARE IF:   Your face is weak and droopy or you have other neurological symptoms.  You have severe joint pain or weakness. MAKE SURE YOU:   Understand these instructions.  Will watch your condition.  Will get help right away if you are not doing well or get worse. FOR MORE INFORMATION Centers for Disease Control and Prevention: www.cdc.gov American Academy of Family Physicians: www.aafp.org Document Released: 04/27/2009 Document Revised: 04/25/2011 Document Reviewed: 04/27/2009 [employer] Patient Information 2014 [employer].  

## 2012-08-07 ENCOUNTER — Ambulatory Visit (INDEPENDENT_AMBULATORY_CARE_PROVIDER_SITE_OTHER): Payer: 59 | Admitting: Internal Medicine

## 2012-08-07 ENCOUNTER — Encounter: Payer: Self-pay | Admitting: Internal Medicine

## 2012-08-07 VITALS — BP 180/110 | HR 81 | Temp 98.4°F | Wt 247.0 lb

## 2012-08-07 DIAGNOSIS — R7309 Other abnormal glucose: Secondary | ICD-10-CM

## 2012-08-07 DIAGNOSIS — I10 Essential (primary) hypertension: Secondary | ICD-10-CM

## 2012-08-07 DIAGNOSIS — E782 Mixed hyperlipidemia: Secondary | ICD-10-CM

## 2012-08-07 DIAGNOSIS — R51 Headache: Secondary | ICD-10-CM

## 2012-08-07 LAB — LIPID PANEL
LDL Cholesterol: 134 mg/dL — ABNORMAL HIGH (ref 0–99)
Total CHOL/HDL Ratio: 5
Triglycerides: 148 mg/dL (ref 0.0–149.0)
VLDL: 29.6 mg/dL (ref 0.0–40.0)

## 2012-08-07 LAB — BASIC METABOLIC PANEL
CO2: 22 mEq/L (ref 19–32)
Calcium: 9.4 mg/dL (ref 8.4–10.5)
Chloride: 107 mEq/L (ref 96–112)
Sodium: 139 mEq/L (ref 135–145)

## 2012-08-07 LAB — MICROALBUMIN / CREATININE URINE RATIO: Creatinine,U: 162.5 mg/dL

## 2012-08-07 LAB — HEMOGLOBIN A1C: Hgb A1c MFr Bld: 6.5 % (ref 4.6–6.5)

## 2012-08-07 LAB — HEPATIC FUNCTION PANEL
ALT: 31 U/L (ref 0–53)
Bilirubin, Direct: 0.1 mg/dL (ref 0.0–0.3)
Total Bilirubin: 0.7 mg/dL (ref 0.3–1.2)

## 2012-08-07 MED ORDER — RAMIPRIL 5 MG PO CAPS: 5.0000 mg | ORAL_CAPSULE | Freq: Every day | ORAL | Status: DC

## 2012-08-07 MED ORDER — TRAMADOL HCL 50 MG PO TABS: 50.0000 mg | ORAL_TABLET | Freq: Three times a day (TID) | ORAL | Status: DC | PRN

## 2012-08-07 NOTE — Assessment & Plan Note (Signed)
Nutrition modification essential because of elevated triglycerides and fasting hyperglycemia. Fasting lipids

## 2012-08-07 NOTE — Assessment & Plan Note (Signed)
Urine microalbumin and hemoglobin A1c

## 2012-08-07 NOTE — Progress Notes (Signed)
  Subjective:    Patient ID: Timothy Page, male    DOB: [birth year], 56 y.o.   MRN: [id number]  HPI HYPERTENSION follow-up:seen 2 UC 07/17/12 for tick bite; BP was 182/104  Home blood pressure range/ average :no monitor  Patient is not on BP medications; PMH of beta blocker intolerance as extreme bradycardia   Exercise program as golf 3X/ week times per week for 2.5 hours  Dietary program :no added salt             Review of Systems No chest pain, palpitations, dyspnea, claudication,edema or paroxysmal nocturnal dyspnea described  No significant lightheadedness,  epistaxis, or syncope.  Occipital headaches with bilateral radiation almost daily , up to level 8. NSAIDS bid for arthritis.     Objective:   Physical Exam  Gen.:  well-nourished in appearance. Alert, appropriate and cooperative throughout exam. Eyes: No corneal or conjunctival inflammation noted. Pupils equal round reactive to light and accommodation. Fundal exam reveals arteriolar narrowing without hemorrhages, exudate, papilledema.  Neck: No deformities, masses, or tenderness noted. Thyroid physiologic asymmetry, R > L. Lungs: Normal respiratory effort; chest expands symmetrically. Lungs are clear to auscultation without rales, wheezes, or increased work of breathing. Heart: Normal rate and rhythm. Normal S1 and S2. No gallop, click, or rub. S4 w/o murmur. Abdomen: Bowel sounds normal; abdomen soft and nontender. No masses, organomegaly or hernias noted. No AAA. Dullness to percussion right upper quadrant without definite hepatomegaly                            Musculoskeletal/extremities: No deformity or scoliosis noted of  the thoracic or lumbar spine.  No clubbing, cyanosis, edema, or significant extremity  deformity noted. Range of motion normal .Tone & strength  Normal. Able to lie down & sit up w/o help.  Vascular: Carotid, radial artery, dorsalis pedis and  posterior tibial pulses are full and equal. No  bruits present. Neurologic: Alert and oriented x3. Deep tendon reflexes symmetrical and 1/2+ @ knees         Skin: Intact without suspicious lesions or rashes. Lymph: No cervical, axillary lymphadenopathy present. Psych: Mood and affect are normal. Normally interactive                                                                                          Assessment & Plan:  #1 see Current Assessment & Plan in Problem List under specific Diagnosis   #2 headache, most likely related to elevated blood pressure. Of concern is his daily intake of nonsteroidals which may aggravate blood pressure and cause chronic daily headaches.

## 2012-08-07 NOTE — Assessment & Plan Note (Signed)
ACE inhibitor will be initiated; ramipril chosen because of positive literature on cardio prophylaxis.  Blood pressure goals discussed; home monitoring essential

## 2012-08-07 NOTE — Patient Instructions (Addendum)
The most common cause of elevated triglycerides (TG) is the ingestion of sugar from high fructose corn syrup sources added to processed foods & drinks.  Eat a low-fat diet with lots of fruits and vegetables, up to 7-9 servings per day. Consume less than 40 Grams (preferably ZERO) of sugar per day from foods & drinks with High Fructose Corn Syrup (HFCS) sugar as #1,2,3 or # 4 on label.Whole [employer], [employer] & [employer] do not carry products with HFCS. White carbohydrates (potatoes, rice, bread, and pasta) have a high spike of sugar and a high load of sugar. For example a  baked potato has a cup of sugar and a  french fry  2 teaspoons of sugar. Yams, wild  rice, whole grained bread &  wheat pasta have been much lower spike and load of  sugar. Portions should be the size of a deck of cards or your palm. Minimal Blood Pressure Goal= AVERAGE < 140/90;  Ideal is an AVERAGE < 135/85. This AVERAGE should be calculated from @ least 5-7 BP readings taken @ different times of day on different days of week. You should not respond to isolated BP readings , but rather the AVERAGE for that week .Please bring your  blood pressure cuff to office visits to verify that it is reliable.It  can also be checked against the blood pressure device at the pharmacy. Finger or wrist cuffs are not dependable; an arm cuff is.  If you activate the  My Chart system; lab & Xray results will be released directly  to you as soon as I review & address these through the computer. If you choose not to sign up for My Chart within 36 hours of labs being drawn; results will be reviewed & interpretation added before being copied & mailed, causing a delay in getting the results to you.If you do not receive that report within 7-10 days ,please call. Additionally you can use this system to gain direct  access to your records  if  out of town or @ an office of a  physician who is not in  the My Chart network.  This improves continuity of care & places  you in control of your medical record.

## 2012-08-09 ENCOUNTER — Other Ambulatory Visit: Payer: Self-pay | Admitting: Internal Medicine

## 2012-08-09 DIAGNOSIS — E782 Mixed hyperlipidemia: Secondary | ICD-10-CM

## 2012-08-09 DIAGNOSIS — E119 Type 2 diabetes mellitus without complications: Secondary | ICD-10-CM | POA: Insufficient documentation

## 2012-08-10 ENCOUNTER — Encounter: Payer: Self-pay | Admitting: *Deleted

## 2012-08-13 ENCOUNTER — Telehealth: Payer: Self-pay | Admitting: Internal Medicine

## 2012-08-13 ENCOUNTER — Other Ambulatory Visit: Payer: Self-pay | Admitting: Internal Medicine

## 2012-08-13 MED ORDER — AMLODIPINE BESYLATE 5 MG PO TABS: 5.0000 mg | ORAL_TABLET | Freq: Every day | ORAL | Status: DC

## 2012-08-13 NOTE — Telephone Encounter (Signed)
Discuss with patient, Rx sent. 

## 2012-08-13 NOTE — Telephone Encounter (Signed)
   His symptoms are most likely related to these  extremely high blood pressures not the medication. Ramipril 5 mg should be continued;  amlodipine 5 mg  daily ( # 30)should be added. It is critical to get the blood pressure to at least 140/90.

## 2012-08-13 NOTE — Telephone Encounter (Signed)
Refill done.  

## 2012-08-13 NOTE — Telephone Encounter (Signed)
Pt BP on 08-09-12 170/100 @7  pm, 148/88 @9  pm.  Pt took med @6 :35 pm. BP on 08-10-12 187/101 @6 :35 am,  177/97 @9 :44 am, 171/92 @ 2:01 pm,172/99 @4  pm, 177/97 @ 7:50pm, 156/94 !0:05 pm. 08-11-12 170/110 @3  pm, 160/95 @6  pm, 187/106 @ 9 pm, 08-12-12 BP 185/103 @6 :12 am,189/114 @12 :04 pm, 163/95 6:30 pm, Today 168/99 @ 6 am  Pt c/o med causing him to feel very lightheaded with on going headache. Pt states that he just feels funny, Pt denies any other symptms. Pt take med every night at 6 pm

## 2012-08-13 NOTE — Telephone Encounter (Signed)
Patient does not think that his blood pressure medication he was recently switched to is working for him. Wants to know what to do.

## 2012-08-21 ENCOUNTER — Telehealth: Payer: Self-pay | Admitting: Internal Medicine

## 2012-08-21 NOTE — Telephone Encounter (Signed)
Patient is calling to let Dr. Alwyn Ren know that his bottom number in his blood pressure did not go below 90 since his medication change. Patient wants to know what he should do.

## 2012-08-21 NOTE — Telephone Encounter (Signed)
Please see me with BP cuff & readings before refilling present meds

## 2012-08-21 NOTE — Telephone Encounter (Signed)
Discuss with patient, appt scheduled. 

## 2012-08-27 ENCOUNTER — Encounter: Payer: Self-pay | Admitting: Lab

## 2012-08-28 ENCOUNTER — Ambulatory Visit (INDEPENDENT_AMBULATORY_CARE_PROVIDER_SITE_OTHER): Payer: 59 | Admitting: Internal Medicine

## 2012-08-28 ENCOUNTER — Encounter: Payer: Self-pay | Admitting: Internal Medicine

## 2012-08-28 VITALS — BP 142/80 | HR 77 | Resp 13 | Wt 246.0 lb

## 2012-08-28 DIAGNOSIS — I10 Essential (primary) hypertension: Secondary | ICD-10-CM

## 2012-08-28 DIAGNOSIS — I2510 Atherosclerotic heart disease of native coronary artery without angina pectoris: Secondary | ICD-10-CM

## 2012-08-28 DIAGNOSIS — M6580 Other synovitis and tenosynovitis, unspecified site: Secondary | ICD-10-CM

## 2012-08-28 DIAGNOSIS — E782 Mixed hyperlipidemia: Secondary | ICD-10-CM

## 2012-08-28 DIAGNOSIS — E119 Type 2 diabetes mellitus without complications: Secondary | ICD-10-CM

## 2012-08-28 DIAGNOSIS — M65929 Unspecified synovitis and tenosynovitis, unspecified upper arm: Secondary | ICD-10-CM

## 2012-08-28 DIAGNOSIS — M659 Synovitis and tenosynovitis, unspecified: Secondary | ICD-10-CM

## 2012-08-28 MED ORDER — ATORVASTATIN CALCIUM 20 MG PO TABS: ORAL_TABLET | ORAL | Status: DC

## 2012-08-28 MED ORDER — RAMIPRIL 10 MG PO CAPS: 10.0000 mg | ORAL_CAPSULE | Freq: Every day | ORAL | Status: DC

## 2012-08-28 NOTE — Assessment & Plan Note (Signed)
Atorvastatin 20 mg M, W , F , Sun. Lipids 3 months

## 2012-08-28 NOTE — Assessment & Plan Note (Signed)
Goals for home glucose monitoring are : fasting  or morning glucose goal of  90-150.  Check this M, W, F , & Sun. Two hours (from "first bite") after any meal , goal = < 180, preferably < 160. Check this Tues after b'fast; Th after lunch; & after eve meal Sat Any low blood glucoses should be reported immediately.

## 2012-08-28 NOTE — Assessment & Plan Note (Signed)
LDL  Goal = < 70

## 2012-08-28 NOTE — Assessment & Plan Note (Addendum)
Aldosterone/renin ratio will be checked. I would recommend avoiding HCTZ because of his borderline diabetes.  Ramipril can be increased to 10 mg daily with blood pressure monitor

## 2012-08-28 NOTE — Patient Instructions (Addendum)
Perform the exercises for elbow pain  twice a day as discussed. Use an anti-inflammatory cream such as Aspercreme or Zostrix cream twice a day to the left elbow after exercises . In lieu of this warm moist compresses or  hot water bottle can be used. Do not apply ice . Minimal Blood Pressure Goal= AVERAGE < 140/90;  Ideal is an AVERAGE < 135/85. This AVERAGE should be calculated from @ least 5-7 BP readings taken @ different times of day on different days of week. You should not respond to isolated BP readings , but rather the AVERAGE for that week .Please bring your  blood pressure cuff to office visits to verify that it is reliable.It  can also be checked against the blood pressure device at the pharmacy.  Check glucose once daily if possible Fasting or morning glucose recommended M, W, F, & Sun if possible. Goal= 100-150 Glucose 2 hours after breakfast Tues, after lunch Thurs & 2 hrs after eve meal Sat if possible. Goal = < 180. Please take enteric-coated aspirin 81 mg daily with breakfast Please  schedule fasting Labs in 12 weeks after nutrition & med changes: BMET,Lipids, hepatic panel, CK . PLEASE BRING THESE INSTRUCTIONS TO FOLLOW UP  LAB APPOINTMENT.This will guarantee correct labs are drawn, eliminating need for repeat blood sampling ( needle sticks ! ). Diagnoses /Codes: 401.9,272.4,995.20

## 2012-08-28 NOTE — Progress Notes (Signed)
  Subjective:    Patient ID: Timothy Page, male    DOB: 1955/04/02, 57 y.o.   MRN: [id number]  HPI  In late June blood pressures were as high as 189/114 prompting the addition of amlodipine 5 mg to ramipril 5 mg. The blood pressure has dropped some and has been as low as 134/86. He brought his cuff in for verification; his reading was 142/93 and are reading was 142/80. This suggests that the readings are valid.  Patient is compliant with medications ? adverse effects noted from medication as lightheadedness initially ; this has improved Exercise program as total gym & [employer] 20 min / day Specific dietary program of low sugar & low-salt diet  His A1c was borderline diabetes at 6.5%. LDL is not at goal of less than 70 based on the borderline diabetes and his history of coronary disease   Review of Systems   No chest pain, palpitations, dyspnea, claudication or paroxysmal nocturnal dyspnea described. Edema by end of day Dull constant  Headache. No epistaxis or syncope.  He has had some pain in the left lateral elbow w/o trigger. No treatment          Objective:   Physical Exam  Appears healthy and well-nourished & in no acute distress  No carotid bruits are present.No neck pain distention present at 10 - 15 degrees. Thyroid normal to palpation  Heart rhythm and rate are normal with no significant murmurs or gallops. S4  Chest is clear with no increased work of breathing  There is no evidence of aortic aneurysm or renal artery bruits  Abdomen soft with no organomegaly or masses. No HJR  No clubbing or  cyanosis . Trace RLE edema present.  Pedal pulses are intact   No ischemic skin changes are present . Nails  Healthy with chronic fungal changes deformity  Alert and oriented. Strength, tone, DTRs reflexes normal        Assessment & Plan:  #1 HTN  #2tenosynovitis #3 See Current Assessment & Plan in Problem List under specific Diagnosis See orders

## 2012-08-29 ENCOUNTER — Other Ambulatory Visit: Payer: Self-pay | Admitting: Internal Medicine

## 2012-08-31 ENCOUNTER — Encounter: Payer: Self-pay | Admitting: Internal Medicine

## 2012-09-03 ENCOUNTER — Encounter: Payer: Self-pay | Admitting: Internal Medicine

## 2012-09-03 NOTE — Telephone Encounter (Signed)
Please follow-up with the lab to see how long this usually takes to process, tomorrow will make a week.

## 2012-09-05 ENCOUNTER — Telehealth: Payer: Self-pay

## 2012-09-05 LAB — ALDOSTERONE + RENIN ACTIVITY W/ RATIO: Aldosterone: 3 ng/dL

## 2012-09-05 NOTE — Telephone Encounter (Signed)
Call from patient and he stated Ramipril was increased to 10 mg but his BP has actually been higher. He stated he takes his medication at 6:30 pm and checks his BP about 6:30 am the following morning, he denied CP, denied SOB, denied Blurred vision, denied numbness anywhere,  but he is having dizziness and headaches. His BP's are 152/110, 168/100, 174/99, 138/95, 149/106, 147/90 and pulses range from 80-89. His last OV was 08/28/12 and his BP was 142/80. The patient is concerned about his BP and would like to know what you suggest. Please advise      KP

## 2012-09-05 NOTE — Telephone Encounter (Signed)
Increase Amlodipine to 5 mg bid. The Aldosterone/ renin test to evaluate resistant HTN is still pending.

## 2012-09-06 MED ORDER — AMLODIPINE BESYLATE 5 MG PO TABS: ORAL_TABLET | ORAL | Status: DC

## 2012-09-06 NOTE — Telephone Encounter (Signed)
Patient has been made aware and he voiced understanding. Rx has been sent      KP

## 2012-09-17 ENCOUNTER — Encounter: Payer: Self-pay | Admitting: Internal Medicine

## 2012-09-18 ENCOUNTER — Encounter: Payer: Self-pay | Admitting: Internal Medicine

## 2012-09-28 ENCOUNTER — Other Ambulatory Visit: Payer: Self-pay | Admitting: Internal Medicine

## 2012-09-28 ENCOUNTER — Telehealth: Payer: Self-pay | Admitting: Internal Medicine

## 2012-09-28 MED ORDER — BAYER MICROLET LANCETS MISC: Status: DC

## 2012-09-28 NOTE — Telephone Encounter (Signed)
Patient called stating he is completely out of diabetic supplies. He was unable to tell me what kind he needed. He also has some questions regarding how he is testing. CB# [phone]

## 2012-09-28 NOTE — Telephone Encounter (Signed)
Spoke with patient. 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 Patient states that he had sent a download from his machine to MyChart. It was not in the chart. He will fax his glucose and BP findings on Monday.

## 2012-10-01 NOTE — Telephone Encounter (Signed)
Rx sent to the pharmacy by e-script.//AB/CMA 

## 2012-10-03 ENCOUNTER — Encounter: Payer: Self-pay | Admitting: Internal Medicine

## 2012-10-04 NOTE — Telephone Encounter (Signed)
Timothy Page is waiting on fax.//AB/CMA

## 2012-10-05 ENCOUNTER — Encounter: Payer: Self-pay | Admitting: Internal Medicine

## 2012-11-20 ENCOUNTER — Other Ambulatory Visit (INDEPENDENT_AMBULATORY_CARE_PROVIDER_SITE_OTHER): Payer: 59

## 2012-11-20 DIAGNOSIS — E119 Type 2 diabetes mellitus without complications: Secondary | ICD-10-CM

## 2012-11-20 DIAGNOSIS — E782 Mixed hyperlipidemia: Secondary | ICD-10-CM

## 2012-11-20 LAB — BASIC METABOLIC PANEL
BUN: 16 mg/dL (ref 6–23)
CO2: 25 mEq/L (ref 19–32)
Glucose, Bld: 174 mg/dL — ABNORMAL HIGH (ref 70–99)
Potassium: 3.9 mEq/L (ref 3.5–5.1)
Sodium: 140 mEq/L (ref 135–145)

## 2012-11-20 LAB — LIPID PANEL
Cholesterol: 196 mg/dL (ref 0–200)
LDL Cholesterol: 128 mg/dL — ABNORMAL HIGH (ref 0–99)
Total CHOL/HDL Ratio: 6
Triglycerides: 177 mg/dL — ABNORMAL HIGH (ref 0.0–149.0)
VLDL: 35.4 mg/dL (ref 0.0–40.0)

## 2012-11-20 LAB — HEPATIC FUNCTION PANEL
ALT: 22 U/L (ref 0–53)
AST: 15 U/L (ref 0–37)
Albumin: 4.2 g/dL (ref 3.5–5.2)
Alkaline Phosphatase: 73 U/L (ref 39–117)
Total Bilirubin: 0.5 mg/dL (ref 0.3–1.2)
Total Protein: 6.8 g/dL (ref 6.0–8.3)

## 2012-11-20 LAB — CK: Total CK: 92 U/L (ref 7–232)

## 2012-11-23 ENCOUNTER — Other Ambulatory Visit: Payer: Self-pay | Admitting: Family Medicine

## 2012-11-23 ENCOUNTER — Encounter: Payer: Self-pay | Admitting: Internal Medicine

## 2012-11-23 ENCOUNTER — Ambulatory Visit (INDEPENDENT_AMBULATORY_CARE_PROVIDER_SITE_OTHER): Payer: 59 | Admitting: Internal Medicine

## 2012-11-23 VITALS — BP 135/77 | HR 68 | Temp 98.4°F | Wt 251.0 lb

## 2012-11-23 DIAGNOSIS — E782 Mixed hyperlipidemia: Secondary | ICD-10-CM

## 2012-11-23 DIAGNOSIS — E119 Type 2 diabetes mellitus without complications: Secondary | ICD-10-CM

## 2012-11-23 DIAGNOSIS — Z23 Encounter for immunization: Secondary | ICD-10-CM

## 2012-11-23 DIAGNOSIS — I10 Essential (primary) hypertension: Secondary | ICD-10-CM

## 2012-11-23 LAB — BASIC METABOLIC PANEL
BUN: 14 mg/dL (ref 6–23)
CO2: 23 mEq/L (ref 19–32)
Calcium: 9.1 mg/dL (ref 8.4–10.5)
Creatinine, Ser: 1 mg/dL (ref 0.4–1.5)
GFR: 82.16 mL/min (ref >60.00)
Glucose, Bld: 153 mg/dL — ABNORMAL HIGH (ref 70–99)
Potassium: 3.9 mEq/L (ref 3.5–5.1)

## 2012-11-23 MED ORDER — METFORMIN HCL 500 MG PO TABS: ORAL_TABLET | ORAL | Status: DC

## 2012-11-23 NOTE — Addendum Note (Signed)
Addended by: Verdie Shire on: 11/23/2012 10:02 AM   Modules accepted: Orders

## 2012-11-23 NOTE — Assessment & Plan Note (Addendum)
Add Metformin & focus on CVE & nutrition Labs 12 weeks

## 2012-11-23 NOTE — Assessment & Plan Note (Signed)
Reassess (after nutrition & exercise changes) in 4 mos

## 2012-11-23 NOTE — Progress Notes (Signed)
  Subjective:    Patient ID: Timothy Page, male    DOB: 19-May-1955, 57 y.o.   MRN: [id number]  HPI  HYPERTENSION: Disease Monitoring: Blood pressure average 156/97 (see BP today) Chest pain, palpitations- no       Dyspnea- no Medications: Compliance- yes (Amlodipine 5 mg  bid & Ramipril qd)  Lightheadedness,Syncope- some positional lightheadedness if he gets up quickly. Almost always flushed & "hot"   Edema- since Amlodipine started, edema by end of day. BUN 16, creatinine 1.1, and GFR 72.09.  HYPERGLYCEMIA, PMH of:  Polyuria w/o polydipsia or polyphagia       Visual problems- blurred ; last Ophth 2 years ago.  A1c 6.7%; it had been 6.5% 3 months ago. Also triglycerides have increased from 148-177.  HYPERLIPIDEMIA: Disease Monitoring: See symptoms for Hypertension Medications: Compliance- statin D/Ced 9/14 due to severe muscle aches.actually R shoulder & paraspinally Abd pain, bowel changes- no .  LDL is 128; this was drawn approximately 4 weeks off statin   Review of Systems  He is on a low sugar diet; he is not exercising due to MS issues.   Family history is negative for premature coronary disease. With A1c > 6.5%reveals his LDL goal is less than 100, ideally < 70. He continues to have some right shoulder discomfort; but the posterior thoracic muscle discomfort has resolved.     Objective:   Physical Exam Appears adequately nourished & in no acute distress. Central weight excess  No carotid bruits are present.No neck pain distention present at 10 - 15 degrees. Thyroid normal to palpation  Heart rhythm and rate are normal with no significant murmurs or gallops. S4  Chest is clear with no increased work of breathing  There is no evidence of aortic aneurysm or renal artery bruits  Abdomen soft with no organomegaly or masses. No HJR. Protuberant  No clubbing, cyanosis or edema present.  Pedal pulses are intact   No ischemic skin changes are present . Nails healthy  with chronic fungal changes   Alert and oriented. Strength, tone, DTRs reflexes normal          Assessment & Plan:  See Current Assessment & Plan in Problem List under specific Diagnosis

## 2012-11-23 NOTE — Assessment & Plan Note (Signed)
Verify BP cuff reliability Support hose for edema

## 2012-11-23 NOTE — Patient Instructions (Signed)
Cardiovascular exercise, this can be as simple a program as walking, is recommended 30-45 minutes 3-4 times per week. If you're not exercising you should take 6-8 weeks to build up to this level. The most common cause of elevated triglycerides (TG) is the ingestion of sugar from high fructose corn syrup sources added to processed foods & drinks.  Eat a low-fat diet with lots of fruits and vegetables, up to 7-9 servings per day. Consume less than 40  Grams (preferably ZERO) of sugar per day from foods & drinks with High Fructose Corn Syrup (HFCS) sugar as #1,2,3 or # 4 on label.Whole Foods, [employer] & [employer] do not carry products with HFCS. Follow a  low carb nutrition program such as [place] or [employer]  to prevent Diabetes progression . White carbohydrates (potatoes, rice, bread, and pasta) have a high spike of sugar and a high load of sugar. For example a  baked potato has a cup of sugar and a  french fry  2 teaspoons of sugar. Yams, wild  rice, whole grained bread &  wheat pasta have been much lower spike and load of  sugar. Portions should be the size of a deck of cards or your palm. Please  schedule fasting Labs in 15 weeks after nutrition changes: BMET,Lipids, CK, TSH, A1c. PLEASE BRING THESE INSTRUCTIONS TO FOLLOW UP  LAB APPOINTMENT.This will guarantee correct labs are drawn, eliminating need for repeat blood sampling ( needle sticks ! ). Diagnoses /Codes: 250.00, 272.4,995.20

## 2013-03-05 ENCOUNTER — Other Ambulatory Visit: Payer: Self-pay | Admitting: Internal Medicine

## 2013-03-05 NOTE — Telephone Encounter (Signed)
Amlodipine and Ramipril refilled per protocol. JG//CMA

## 2013-03-14 ENCOUNTER — Other Ambulatory Visit (INDEPENDENT_AMBULATORY_CARE_PROVIDER_SITE_OTHER): Payer: 59

## 2013-03-14 DIAGNOSIS — E782 Mixed hyperlipidemia: Secondary | ICD-10-CM

## 2013-03-14 DIAGNOSIS — E119 Type 2 diabetes mellitus without complications: Secondary | ICD-10-CM

## 2013-03-14 DIAGNOSIS — E785 Hyperlipidemia, unspecified: Secondary | ICD-10-CM

## 2013-03-14 DIAGNOSIS — T887XXA Unspecified adverse effect of drug or medicament, initial encounter: Secondary | ICD-10-CM

## 2013-03-14 LAB — LDL CHOLESTEROL, DIRECT: Direct LDL: 111.4 mg/dL

## 2013-03-14 LAB — HEMOGLOBIN A1C: HEMOGLOBIN A1C: 6.8 % — AB (ref 4.6–6.5)

## 2013-03-14 LAB — HEPATIC FUNCTION PANEL
ALBUMIN: 4.4 g/dL (ref 3.5–5.2)
ALT: 37 U/L (ref 0–53)
AST: 20 U/L (ref 0–37)
Alkaline Phosphatase: 88 U/L (ref 39–117)
Bilirubin, Direct: 0 mg/dL (ref 0.0–0.3)
Total Bilirubin: 0.6 mg/dL (ref 0.3–1.2)
Total Protein: 7.2 g/dL (ref 6.0–8.3)

## 2013-03-14 LAB — TSH: TSH: 0.9 u[IU]/mL (ref 0.35–5.50)

## 2013-03-14 LAB — LIPID PANEL
CHOLESTEROL: 185 mg/dL (ref 0–200)
HDL: 31.4 mg/dL — ABNORMAL LOW (ref >39.00)
TRIGLYCERIDES: 270 mg/dL — AB (ref 0.0–149.0)
Total CHOL/HDL Ratio: 6
VLDL: 54 mg/dL — ABNORMAL HIGH (ref 0.0–40.0)

## 2013-03-14 LAB — CK: Total CK: 87 U/L (ref 7–232)

## 2013-03-17 ENCOUNTER — Other Ambulatory Visit: Payer: Self-pay | Admitting: Internal Medicine

## 2013-03-17 DIAGNOSIS — E119 Type 2 diabetes mellitus without complications: Secondary | ICD-10-CM

## 2013-03-17 DIAGNOSIS — E782 Mixed hyperlipidemia: Secondary | ICD-10-CM

## 2013-03-19 ENCOUNTER — Other Ambulatory Visit: Payer: 59

## 2013-05-27 ENCOUNTER — Other Ambulatory Visit: Payer: Self-pay

## 2013-05-27 MED ORDER — METFORMIN HCL 500 MG PO TABS: ORAL_TABLET | ORAL | Status: DC

## 2013-05-27 MED ORDER — AMLODIPINE BESYLATE 5 MG PO TABS: ORAL_TABLET | ORAL | Status: DC

## 2013-05-27 MED ORDER — RAMIPRIL 10 MG PO CAPS: ORAL_CAPSULE | ORAL | Status: DC

## 2013-11-06 ENCOUNTER — Encounter: Payer: Self-pay | Admitting: Internal Medicine

## 2013-11-29 ENCOUNTER — Other Ambulatory Visit: Payer: Self-pay

## 2014-03-18 ENCOUNTER — Other Ambulatory Visit: Payer: Self-pay | Admitting: Internal Medicine

## 2014-08-11 ENCOUNTER — Other Ambulatory Visit: Payer: Self-pay

## 2014-08-17 ENCOUNTER — Encounter: Payer: Self-pay | Admitting: Internal Medicine

## 2014-08-20 ENCOUNTER — Other Ambulatory Visit: Payer: Self-pay | Admitting: Internal Medicine

## 2014-08-20 ENCOUNTER — Ambulatory Visit (INDEPENDENT_AMBULATORY_CARE_PROVIDER_SITE_OTHER): Payer: 59 | Admitting: Internal Medicine

## 2014-08-20 ENCOUNTER — Encounter: Payer: Self-pay | Admitting: Internal Medicine

## 2014-08-20 VITALS — BP 180/112 | HR 94 | Temp 98.3°F | Resp 16 | Wt 238.0 lb

## 2014-08-20 DIAGNOSIS — E782 Mixed hyperlipidemia: Secondary | ICD-10-CM

## 2014-08-20 DIAGNOSIS — K029 Dental caries, unspecified: Secondary | ICD-10-CM

## 2014-08-20 DIAGNOSIS — M5412 Radiculopathy, cervical region: Secondary | ICD-10-CM | POA: Diagnosis not present

## 2014-08-20 DIAGNOSIS — I10 Essential (primary) hypertension: Secondary | ICD-10-CM

## 2014-08-20 DIAGNOSIS — E1165 Type 2 diabetes mellitus with hyperglycemia: Secondary | ICD-10-CM

## 2014-08-20 MED ORDER — GABAPENTIN 100 MG PO CAPS: 100.0000 mg | ORAL_CAPSULE | Freq: Three times a day (TID) | ORAL | Status: DC

## 2014-08-20 MED ORDER — PREDNISONE 20 MG PO TABS: 20.0000 mg | ORAL_TABLET | Freq: Two times a day (BID) | ORAL | Status: DC

## 2014-08-20 MED ORDER — CLONIDINE HCL 0.1 MG PO TABS: 0.1000 mg | ORAL_TABLET | Freq: Two times a day (BID) | ORAL | Status: DC

## 2014-08-20 NOTE — Patient Instructions (Addendum)
Use a cervical memory foam pillow to prevent hyperextension or hyperflexion of the cervical spine.  Assess response to the gabapentin one every 8 hours as needed. If it is partially beneficial, it can be increased up to a total of 3 pills every 8 hours as needed. This increase of 1 pill each dose  should take place over 72 hours at least.If 300 mg is effective dose ; there is a 300 mg pill.  Minimal Blood Pressure Goal= AVERAGE < 140/90;  Ideal is an AVERAGE < 135/85. This AVERAGE should be calculated from @ least 5-7 BP readings taken @ different times of day on different days of week. You should not respond to isolated BP readings , but rather the AVERAGE for that week .Please bring your  blood pressure cuff to office visits to verify that it is reliable.It  can also be checked against the blood pressure device at the pharmacy. Finger or wrist cuffs are not dependable; an arm cuff is.  Unfortunately smokeless tobacco can be associated with leukoplakia which are premalignant intraoral lesions. Also the high sugar content can lead to significant caries.  Caries ( cavities) and plaque on the teeth can lead to significant local and systemic infections with threat to your health.Please have dental care completed as soon as possible.

## 2014-08-20 NOTE — Progress Notes (Signed)
   Subjective:    Patient ID: Timothy Page, male    DOB: [birth year], 59 y.o.   MRN: [id number]  HPI His symptoms began 08/14/14 upon awakening as numbness and severe tingling from the right triceps to all 5 fingers of the left hand. He describes it as  "10,000 bumble bees stinging me". This was associated with weakness but no other neuro muscular symptoms.  He has no past history of neck injury or surgery.  He finds his symptoms are worse in the right lateral decubitus position but better in the left lateral decubitus position with the arm flexed.  He is worried he has had a clot causing a stroke.  He states she's been compliant with his blood pressure medicines. He is not monitoring his blood pressure or glucose. He has failed to return for lab monitoring as directed.   Review of Systems Fever, chills, sweats, or unexplained weight loss not present. No significant headaches. Mental status change or memory loss denied. Blurred vision , diplopia or vision loss absent. Vertigo, near syncope or imbalance denied. No loss of control of bladder or bowels. No seizure stigmata. Chest pain, palpitations, tachycardia, exertional dyspnea, paroxysmal nocturnal dyspnea, claudication or edema are absent.      Objective:   Physical Exam  Pertinent or positive findings include: Pattern alopecia is present. He has a small mustache. There is tobacco staining of his tongue. He has severe caries to the gumline and below. An S4 is noted. Abdomen is protuberant. 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 He has crepitus of knees. There is weakness to opposition in the left hand. No other neurologic deficits are present.  General appearance :adequately nourished; in no distress.  Eyes: No conjunctival inflammation or scleral icterus is present.  Oral exam:  Lips and gums are healthy appearing.There is no  oropharyngeal erythema or exudate noted.   Heart:  Normal rate and regular rhythm. S1 and S2 normal without gallop, murmur, click, or rub .    Lungs:Chest clear to auscultation; no wheezes, rhonchi,rales ,or rubs present.No increased work of breathing.   Abdomen: bowel sounds normal, soft and non-tender without masses, organomegaly or hernias noted.  No guarding or rebound. No flank tenderness to percussion.  Vascular : all pulses equal ; no bruits present.  Skin:Warm & dry.  Intact without suspicious lesions or rashes ; no tenting or jaundice   Lymphatic: No lymphadenopathy is noted about the head, neck, axilla  Neuro: Strength, tone & DTRs normal.       Assessment & Plan:  #1 acute cervical radiculopathy  #2 uncontrolled hypertension. By history he has been intolerant to the beta blocker atenolol.  #3 noncompliance with ongoing monitoring of his diabetes and dyslipidemia.  See orders and recommendations

## 2014-08-20 NOTE — Progress Notes (Signed)
Pre visit review using our clinic review tool, if applicable. No additional management support is needed unless otherwise documented below in the visit note. 

## 2014-08-21 ENCOUNTER — Other Ambulatory Visit: Payer: Self-pay

## 2014-08-21 ENCOUNTER — Encounter: Payer: Self-pay | Admitting: Internal Medicine

## 2014-08-21 DIAGNOSIS — M5412 Radiculopathy, cervical region: Secondary | ICD-10-CM

## 2014-08-21 DIAGNOSIS — I10 Essential (primary) hypertension: Secondary | ICD-10-CM

## 2014-08-21 MED ORDER — GABAPENTIN 100 MG PO CAPS: 100.0000 mg | ORAL_CAPSULE | Freq: Three times a day (TID) | ORAL | Status: DC

## 2014-08-21 MED ORDER — CLONIDINE HCL 0.1 MG PO TABS: 0.1000 mg | ORAL_TABLET | Freq: Two times a day (BID) | ORAL | Status: DC

## 2014-08-25 ENCOUNTER — Other Ambulatory Visit (INDEPENDENT_AMBULATORY_CARE_PROVIDER_SITE_OTHER): Payer: 59

## 2014-08-25 DIAGNOSIS — E1165 Type 2 diabetes mellitus with hyperglycemia: Secondary | ICD-10-CM

## 2014-08-25 DIAGNOSIS — I10 Essential (primary) hypertension: Secondary | ICD-10-CM | POA: Diagnosis not present

## 2014-08-25 DIAGNOSIS — E782 Mixed hyperlipidemia: Secondary | ICD-10-CM

## 2014-08-25 LAB — LIPID PANEL
CHOL/HDL RATIO: 5
CHOLESTEROL: 174 mg/dL (ref 0–200)
HDL: 35.5 mg/dL — ABNORMAL LOW (ref >39.00)
LDL CALC: 114 mg/dL — AB (ref 0–99)
NONHDL: 138.5
TRIGLYCERIDES: 124 mg/dL (ref 0.0–149.0)
VLDL: 24.8 mg/dL (ref 0.0–40.0)

## 2014-08-25 LAB — BASIC METABOLIC PANEL
BUN: 19 mg/dL (ref 6–23)
CO2: 26 mEq/L (ref 19–32)
Calcium: 9.4 mg/dL (ref 8.4–10.5)
Chloride: 102 mEq/L (ref 96–112)
Creatinine, Ser: 1.16 mg/dL (ref 0.40–1.50)
GFR: 68.8 mL/min (ref >60.00)
Glucose, Bld: 182 mg/dL — ABNORMAL HIGH (ref 70–99)
Potassium: 4.1 mEq/L (ref 3.5–5.1)
Sodium: 138 mEq/L (ref 135–145)

## 2014-08-25 LAB — HEPATIC FUNCTION PANEL
ALK PHOS: 101 U/L (ref 39–117)
ALT: 26 U/L (ref 0–53)
AST: 12 U/L (ref 0–37)
Albumin: 4.2 g/dL (ref 3.5–5.2)
BILIRUBIN DIRECT: 0.1 mg/dL (ref 0.0–0.3)
TOTAL PROTEIN: 6.8 g/dL (ref 6.0–8.3)
Total Bilirubin: 0.6 mg/dL (ref 0.2–1.2)

## 2014-08-25 LAB — HEMOGLOBIN A1C: Hgb A1c MFr Bld: 6.7 % — ABNORMAL HIGH (ref 4.6–6.5)

## 2014-08-25 LAB — MICROALBUMIN / CREATININE URINE RATIO
Creatinine,U: 247.3 mg/dL
MICROALB UR: 1.4 mg/dL (ref 0.0–1.9)
Microalb Creat Ratio: 0.6 mg/g (ref 0.0–30.0)

## 2014-08-25 LAB — TSH: TSH: 0.76 u[IU]/mL (ref 0.35–4.50)

## 2014-08-27 ENCOUNTER — Ambulatory Visit (INDEPENDENT_AMBULATORY_CARE_PROVIDER_SITE_OTHER): Payer: 59 | Admitting: Internal Medicine

## 2014-08-27 ENCOUNTER — Encounter: Payer: Self-pay | Admitting: Internal Medicine

## 2014-08-27 VITALS — BP 130/85 | HR 84 | Temp 98.1°F | Resp 16 | Wt 240.0 lb

## 2014-08-27 DIAGNOSIS — E782 Mixed hyperlipidemia: Secondary | ICD-10-CM

## 2014-08-27 DIAGNOSIS — E1165 Type 2 diabetes mellitus with hyperglycemia: Secondary | ICD-10-CM | POA: Diagnosis not present

## 2014-08-27 DIAGNOSIS — M5412 Radiculopathy, cervical region: Secondary | ICD-10-CM

## 2014-08-27 DIAGNOSIS — I10 Essential (primary) hypertension: Secondary | ICD-10-CM | POA: Diagnosis not present

## 2014-08-27 MED ORDER — GABAPENTIN 300 MG PO CAPS: 300.0000 mg | ORAL_CAPSULE | Freq: Three times a day (TID) | ORAL | Status: DC

## 2014-08-27 NOTE — Patient Instructions (Signed)
Finish the gabapentin as 100 mg 2 pills every 8 hours as needed then fill the prescription for the 300 mg pill.

## 2014-08-27 NOTE — Progress Notes (Signed)
   Subjective:    Patient ID: Timothy Page, male    DOB: [birth year], 59 y.o.   MRN: [id number]  HPI The pain in the left upper extremity has resolved but the "bumblebee stinging "sensation persist. This will resolve if he rotates his neck maximally to the right.  He's not been monitoring his blood pressure at home. He does eat red meat. He does not add salt at the table.  He awakes sweating but has no other constitutional or cardiovascular symptoms   Review of Systems  Chest pain, palpitations, tachycardia, exertional dyspnea, paroxysmal nocturnal dyspnea, claudication or edema are absent.      Objective:   Physical Exam  There is very subtle anisocoria; the left pupil was slightly bigger than the right. There is also asymmetric slight ptosis, mainly on the right.  Appears healthy and well-nourished & in no acute distress  No carotid bruits are present.No neck vein distention present at 10 - 15 degrees. Thyroid normal to palpation  Heart rhythm and rate are normal with no gallop or murmur  Chest is clear with no increased work of breathing  There is no evidence of aortic aneurysm or renal artery bruits  Abdomen soft with no organomegaly or masses. No HJR  No clubbing, cyanosis or edema present.  Pedal pulses are intact   No ischemic skin changes are present . Fingernails healthy   Alert and oriented. Strength, tone, DTRs reflexes normal        Assessment & Plan:  #1 cervical radiculopathy  #2 mixed lipidemia, improved  #3 Diabetes adequate control  #4 hypertension, adequate control  Plan: Titrate gabapentin up to 300 mg every 8 hours. If there is no improvement; MRI of the cervical spine will be ordered

## 2014-08-27 NOTE — Progress Notes (Signed)
Pre visit review using our clinic review tool, if applicable. No additional management support is needed unless otherwise documented below in the visit note. 

## 2014-08-28 ENCOUNTER — Other Ambulatory Visit: Payer: Self-pay | Admitting: Internal Medicine

## 2014-08-29 ENCOUNTER — Other Ambulatory Visit: Payer: Self-pay | Admitting: Emergency Medicine

## 2014-08-29 DIAGNOSIS — M5412 Radiculopathy, cervical region: Secondary | ICD-10-CM

## 2014-08-29 MED ORDER — GABAPENTIN 300 MG PO CAPS: 300.0000 mg | ORAL_CAPSULE | Freq: Three times a day (TID) | ORAL | Status: DC

## 2014-08-29 NOTE — Telephone Encounter (Signed)
Seen 08/27/14, please advise

## 2014-08-29 NOTE — Telephone Encounter (Signed)
OK 

## 2014-10-27 ENCOUNTER — Other Ambulatory Visit: Payer: Self-pay | Admitting: Emergency Medicine

## 2014-10-27 DIAGNOSIS — I10 Essential (primary) hypertension: Secondary | ICD-10-CM

## 2014-10-27 MED ORDER — CLONIDINE HCL 0.1 MG PO TABS: 0.1000 mg | ORAL_TABLET | Freq: Two times a day (BID) | ORAL | Status: DC

## 2014-11-24 ENCOUNTER — Other Ambulatory Visit: Payer: Self-pay | Admitting: Emergency Medicine

## 2014-11-24 DIAGNOSIS — I10 Essential (primary) hypertension: Secondary | ICD-10-CM

## 2014-11-24 MED ORDER — CLONIDINE HCL 0.1 MG PO TABS: 0.1000 mg | ORAL_TABLET | Freq: Two times a day (BID) | ORAL | Status: DC

## 2014-11-24 MED ORDER — RAMIPRIL 10 MG PO CAPS: ORAL_CAPSULE | ORAL | Status: DC

## 2014-11-24 MED ORDER — METFORMIN HCL 500 MG PO TABS: ORAL_TABLET | ORAL | Status: DC

## 2014-11-25 ENCOUNTER — Other Ambulatory Visit: Payer: Self-pay | Admitting: Internal Medicine

## 2014-11-26 ENCOUNTER — Other Ambulatory Visit: Payer: Self-pay | Admitting: Emergency Medicine

## 2014-11-26 MED ORDER — AMLODIPINE BESYLATE 5 MG PO TABS: ORAL_TABLET | ORAL | Status: DC

## 2015-03-06 ENCOUNTER — Encounter: Payer: Self-pay | Admitting: Nurse Practitioner

## 2015-03-06 ENCOUNTER — Other Ambulatory Visit: Payer: Self-pay | Admitting: Nurse Practitioner

## 2015-03-06 ENCOUNTER — Ambulatory Visit (INDEPENDENT_AMBULATORY_CARE_PROVIDER_SITE_OTHER)
Admission: RE | Admit: 2015-03-06 | Discharge: 2015-03-06 | Disposition: A | Discharge status: Home / Self Care | Payer: 59 | Source: Ambulatory Visit | Attending: Nurse Practitioner | Admitting: Nurse Practitioner

## 2015-03-06 ENCOUNTER — Telehealth: Payer: Self-pay

## 2015-03-06 ENCOUNTER — Ambulatory Visit (INDEPENDENT_AMBULATORY_CARE_PROVIDER_SITE_OTHER): Payer: 59 | Admitting: Nurse Practitioner

## 2015-03-06 VITALS — BP 170/110 | HR 100 | Temp 98.7°F | Resp 22 | Wt 235.0 lb

## 2015-03-06 DIAGNOSIS — R059 Cough, unspecified: Secondary | ICD-10-CM | POA: Insufficient documentation

## 2015-03-06 DIAGNOSIS — R05 Cough: Secondary | ICD-10-CM | POA: Diagnosis not present

## 2015-03-06 MED ORDER — AZITHROMYCIN 250 MG PO TABS: ORAL_TABLET | ORAL | Status: DC

## 2015-03-06 MED ORDER — ALBUTEROL SULFATE HFA 108 (90 BASE) MCG/ACT IN AERS: 2.0000 | INHALATION_SPRAY | Freq: Four times a day (QID) | RESPIRATORY_TRACT | Status: DC | PRN

## 2015-03-06 NOTE — Patient Instructions (Signed)
We will send in a prescription based on your Chest x-ray results today.   PLEASE take your blood pressure medication as soon as possible

## 2015-03-06 NOTE — Telephone Encounter (Signed)
Called pt to let him know xray results. Wife answered. Let her know that he was negative for pneumonia and an inhaler and zpak were sent in to his pharmacy. I advised they call back if sxs worsen or do not improve.

## 2015-03-06 NOTE — Progress Notes (Signed)
Patient ID: Timothy Page, male    DOB: 03-25-1955  Age: 60 y.o. MRN: [id number]  CC: Cough   HPI Timothy Page presents for CC of 1 week sinusitis with cough/wheezing  1) pt reports 1 week of productive cough, sinus pressure, chest congestion, SOB, Wheezing, fever  Fever- Subjective Right ear pain  Metallic taste in mouth  Hasn't taken BP medication Stopped last Friday due to taking "all this other medication"   Treatment to date:  [place] has a past medical history of Seasonal allergies; Lipoma; Heart attack ([place]) (1980); Arthritis; and adenomatous polyp of colon (07/02/2009).   He has past surgical history that includes LASIK; finger amputaion; and Cardiac catheterization (1980).   His family history includes COPD in his mother; Cancer in his father, mother, and paternal grandmother; Diabetes in his father and mother; Heart disease in his maternal grandfather, maternal grandmother, and paternal grandfather; Mental illness in his father.He reports that he has never smoked. His smokeless tobacco use includes Snuff. He reports that he does not drink alcohol or use illicit drugs.  Outpatient Prescriptions Prior to Visit  Medication Sig Dispense Refill  . amLODipine (NORVASC) 5 MG tablet Take 1 tablet by mouth  twice a day 180 tablet 1  . aspirin 81 MG tablet Take 81 mg by mouth daily.    [name] BAYER MICROLET LANCETS lancets USE AS DIRECTED 300 each 0  . cloNIDine (CATAPRES) 0.1 MG tablet Take 1 tablet (0.1 mg total) by mouth 2 (two) times daily. 180 tablet 1  . Ibuprofen (ADVIL PO) Take by mouth as needed.    . metFORMIN (GLUCOPHAGE) 500 MG tablet Take 1 tablet by mouth  every day with largest meal 90 tablet 2  . ramipril (ALTACE) 10 MG capsule TAKE 1 CAPSULE BY MOUTH  ONCE DAILY 90 capsule 2  . gabapentin (NEURONTIN) 300 MG capsule Take 1 capsule (300 mg total) by mouth 3 (three) times daily. 90 capsule 0  . predniSONE (DELTASONE) 20 MG  tablet Take 1 tablet (20 mg total) by mouth 2 (two) times daily. 14 tablet 0   No facility-administered medications prior to visit.    ROS Review of Systems  Constitutional: Negative for fever, chills, diaphoresis and fatigue.  HENT: Positive for congestion, ear pain, postnasal drip, rhinorrhea, sinus pressure and sneezing. Negative for sore throat, trouble swallowing and voice change.   Eyes: Negative for visual disturbance.  Respiratory: Positive for cough, shortness of breath and wheezing. Negative for chest tightness.   Cardiovascular: Negative for chest pain, palpitations and leg swelling.  Gastrointestinal: Negative for nausea, vomiting and diarrhea.  Skin: Negative for rash.  Neurological: Positive for headaches. Negative for dizziness.    Objective:  BP 170/110 mmHg  Pulse 100  Temp(Src) 98.7 F (37.1 C) (Oral)  Resp 22  Wt 235 lb (106.595 kg)  SpO2 97%  Physical Exam  Constitutional: He is oriented to person, place, and time. He appears well-developed and well-nourished. No distress.  HENT:  Head: Normocephalic and atraumatic.  Right Ear: External ear normal.  Left Ear: External ear normal.  Mouth/Throat: No oropharyngeal exudate.  Eyes: Conjunctivae and EOM are normal. Pupils are equal, round, and reactive to light. Right eye exhibits no discharge. Left eye exhibits no discharge. No scleral icterus.  Watery eyes  Neck: Normal range of motion. Neck supple.  Cardiovascular: Normal rate, regular rhythm, normal heart sounds and intact distal pulses.  Exam reveals no gallop  and no friction rub.   No murmur heard. Pulmonary/Chest: Effort normal. No respiratory distress. He has no wheezes. He has no rales. He exhibits no tenderness.  Rhonchi right lower lobe  Lymphadenopathy:    He has no cervical adenopathy.  Neurological: He is alert and oriented to person, place, and time.  Skin: Skin is warm. No rash noted. He is diaphoretic.  Psychiatric: He has a normal mood and  affect. His behavior is normal. Judgment and thought content normal.   Assessment & Plan:   [place] was seen today for cough.  Diagnoses and all orders for this visit:  Cough -     DG Chest 2 View; Future   I have discontinued Mr. Srinivas predniSONE and gabapentin. I am also having him maintain his Ibuprofen (ADVIL PO), BAYER MICROLET LANCETS, aspirin, cloNIDine, ramipril, metFORMIN, and amLODipine.  No orders of the defined types were placed in this encounter.     Follow-up: Return if symptoms worsen or fail to improve.

## 2015-03-06 NOTE — Assessment & Plan Note (Signed)
Due to findings on exam and pt looks uncomfortable in exam room today- will get CXR today and treat based on findings. Will be fairly aggressive. Pt stopped BP medication last Friday because he states "alkaseltzer packaging told me not to take with my BP medication". Advised him to stop the alkaseltzer and take his BP medication when he returns home. FU prn worsening/failure to improve.

## 2015-04-06 ENCOUNTER — Other Ambulatory Visit: Payer: Self-pay | Admitting: Internal Medicine

## 2015-04-06 NOTE — Telephone Encounter (Signed)
Called and left message asking patient to call back to schedule appt with new pcp---let tamara know when patient calls back so that clonifine rx request can be sent to pharm

## 2015-06-19 ENCOUNTER — Other Ambulatory Visit: Payer: Self-pay | Admitting: Internal Medicine

## 2015-06-19 NOTE — Telephone Encounter (Signed)
Left message advising patient to call back to schedule appt/get established with new pcp---patient's appt can be June or July or august---let tamara know when appt is made so that refill for amlodipine can be sent to patient's pharm

## 2015-07-27 ENCOUNTER — Other Ambulatory Visit: Payer: Self-pay | Admitting: Internal Medicine

## 2015-07-27 NOTE — Telephone Encounter (Signed)
Left message advising patient to call back to get established/make appt with new pcp---appt can be aug/sept/oct---let tamara know when appt is made so that refill for ramipril can be sent to pharm

## 2015-07-29 ENCOUNTER — Other Ambulatory Visit: Payer: Self-pay | Admitting: *Deleted

## 2015-08-13 ENCOUNTER — Other Ambulatory Visit: Payer: Self-pay | Admitting: Internal Medicine

## 2015-08-13 NOTE — Telephone Encounter (Signed)
Left message advising patient to call back to schedule appt/get established with new pcp---appt can be sept or October---let tamara know when appt is made so that refill for metformin can be sent to pharm

## 2016-02-15 DIAGNOSIS — Z9089 Acquired absence of other organs: Secondary | ICD-10-CM

## 2016-02-15 DIAGNOSIS — E890 Postprocedural hypothyroidism: Secondary | ICD-10-CM

## 2016-02-15 HISTORY — DX: Postprocedural hypothyroidism: E89.0

## 2016-02-15 HISTORY — DX: Acquired absence of other organs: Z90.89

## 2016-07-15 DIAGNOSIS — D6481 Anemia due to antineoplastic chemotherapy: Secondary | ICD-10-CM | POA: Diagnosis present

## 2016-07-15 DIAGNOSIS — E890 Postprocedural hypothyroidism: Secondary | ICD-10-CM | POA: Diagnosis present

## 2016-07-15 DIAGNOSIS — J3800 Paralysis of vocal cords and larynx, unspecified: Secondary | ICD-10-CM

## 2016-07-15 HISTORY — DX: Postprocedural hypothyroidism: E89.0

## 2016-07-15 HISTORY — DX: Paralysis of vocal cords and larynx, unspecified: J38.00

## 2016-07-31 ENCOUNTER — Emergency Department (HOSPITAL_COMMUNITY): Payer: 59

## 2016-07-31 ENCOUNTER — Inpatient Hospital Stay (HOSPITAL_COMMUNITY)
Admission: EM | Admit: 2016-07-31 | Discharge: 2016-08-05 | DRG: 626 | Disposition: A | Discharge status: Home / Self Care | Payer: 59 | Attending: Internal Medicine | Admitting: Internal Medicine

## 2016-07-31 ENCOUNTER — Encounter (HOSPITAL_COMMUNITY): Payer: Self-pay | Admitting: Emergency Medicine

## 2016-07-31 ENCOUNTER — Ambulatory Visit (INDEPENDENT_AMBULATORY_CARE_PROVIDER_SITE_OTHER)
Admission: EM | Admit: 2016-07-31 | Discharge: 2016-07-31 | Disposition: A | Discharge status: Nursing Home (Includes ICF) | Payer: 59 | Source: Home / Self Care | Attending: Internal Medicine | Admitting: Internal Medicine

## 2016-07-31 DIAGNOSIS — M542 Cervicalgia: Secondary | ICD-10-CM

## 2016-07-31 DIAGNOSIS — Z6830 Body mass index (BMI) 30.0-30.9, adult: Secondary | ICD-10-CM | POA: Diagnosis not present

## 2016-07-31 DIAGNOSIS — I10 Essential (primary) hypertension: Secondary | ICD-10-CM | POA: Diagnosis present

## 2016-07-31 DIAGNOSIS — R0602 Shortness of breath: Secondary | ICD-10-CM

## 2016-07-31 DIAGNOSIS — I252 Old myocardial infarction: Secondary | ICD-10-CM | POA: Diagnosis not present

## 2016-07-31 DIAGNOSIS — Z7984 Long term (current) use of oral hypoglycemic drugs: Secondary | ICD-10-CM | POA: Diagnosis not present

## 2016-07-31 DIAGNOSIS — E119 Type 2 diabetes mellitus without complications: Secondary | ICD-10-CM | POA: Diagnosis present

## 2016-07-31 DIAGNOSIS — Z7982 Long term (current) use of aspirin: Secondary | ICD-10-CM | POA: Diagnosis not present

## 2016-07-31 DIAGNOSIS — R221 Localized swelling, mass and lump, neck: Secondary | ICD-10-CM

## 2016-07-31 DIAGNOSIS — J9811 Atelectasis: Secondary | ICD-10-CM | POA: Diagnosis present

## 2016-07-31 DIAGNOSIS — E782 Mixed hyperlipidemia: Secondary | ICD-10-CM | POA: Diagnosis present

## 2016-07-31 DIAGNOSIS — E049 Nontoxic goiter, unspecified: Secondary | ICD-10-CM | POA: Diagnosis not present

## 2016-07-31 DIAGNOSIS — F419 Anxiety disorder, unspecified: Secondary | ICD-10-CM | POA: Diagnosis not present

## 2016-07-31 DIAGNOSIS — Z833 Family history of diabetes mellitus: Secondary | ICD-10-CM | POA: Diagnosis not present

## 2016-07-31 DIAGNOSIS — Z8249 Family history of ischemic heart disease and other diseases of the circulatory system: Secondary | ICD-10-CM | POA: Diagnosis not present

## 2016-07-31 DIAGNOSIS — Z808 Family history of malignant neoplasm of other organs or systems: Secondary | ICD-10-CM | POA: Diagnosis not present

## 2016-07-31 DIAGNOSIS — J45909 Unspecified asthma, uncomplicated: Secondary | ICD-10-CM | POA: Diagnosis present

## 2016-07-31 DIAGNOSIS — F17220 Nicotine dependence, chewing tobacco, uncomplicated: Secondary | ICD-10-CM | POA: Diagnosis present

## 2016-07-31 DIAGNOSIS — E669 Obesity, unspecified: Secondary | ICD-10-CM | POA: Diagnosis present

## 2016-07-31 DIAGNOSIS — T380X5A Adverse effect of glucocorticoids and synthetic analogues, initial encounter: Secondary | ICD-10-CM | POA: Diagnosis present

## 2016-07-31 DIAGNOSIS — Z86010 Personal history of colonic polyps: Secondary | ICD-10-CM

## 2016-07-31 DIAGNOSIS — D179 Benign lipomatous neoplasm, unspecified: Secondary | ICD-10-CM

## 2016-07-31 DIAGNOSIS — E1165 Type 2 diabetes mellitus with hyperglycemia: Secondary | ICD-10-CM | POA: Diagnosis not present

## 2016-07-31 DIAGNOSIS — R509 Fever, unspecified: Secondary | ICD-10-CM

## 2016-07-31 DIAGNOSIS — H9201 Otalgia, right ear: Secondary | ICD-10-CM

## 2016-07-31 LAB — I-STAT CG4 LACTIC ACID, ED
LACTIC ACID, VENOUS: 1.44 mmol/L (ref 0.5–1.9)
Lactic Acid, Venous: 1.83 mmol/L (ref 0.5–1.9)

## 2016-07-31 LAB — I-STAT CHEM 8, ED
BUN: 11 mg/dL (ref 6–20)
CALCIUM ION: 1.17 mmol/L (ref 1.15–1.40)
CREATININE: 1 mg/dL (ref 0.61–1.24)
Chloride: 101 mmol/L (ref 101–111)
GLUCOSE: 174 mg/dL — AB (ref 65–99)
HCT: 46 % (ref 39.0–52.0)
HEMOGLOBIN: 15.6 g/dL (ref 13.0–17.0)
Potassium: 3.8 mmol/L (ref 3.5–5.1)
Sodium: 139 mmol/L (ref 135–145)
TCO2: 26 mmol/L (ref 0–100)

## 2016-07-31 LAB — URINALYSIS, ROUTINE W REFLEX MICROSCOPIC
BACTERIA UA: NONE SEEN
BILIRUBIN URINE: NEGATIVE
Glucose, UA: >=500 mg/dL — AB
HGB URINE DIPSTICK: NEGATIVE
Ketones, ur: NEGATIVE mg/dL
Leukocytes, UA: NEGATIVE
Nitrite: NEGATIVE
PROTEIN: NEGATIVE mg/dL
Specific Gravity, Urine: 1.01 (ref 1.005–1.030)
Squamous Epithelial / LPF: NONE SEEN
pH: 7 (ref 5.0–8.0)

## 2016-07-31 LAB — CBC WITH DIFFERENTIAL/PLATELET
BASOS PCT: 0 %
Basophils Absolute: 0 10*3/uL (ref 0.0–0.1)
EOS ABS: 0 10*3/uL (ref 0.0–0.7)
Eosinophils Relative: 0 %
HEMATOCRIT: 44.8 % (ref 39.0–52.0)
Hemoglobin: 15.9 g/dL (ref 13.0–17.0)
LYMPHS ABS: 1.1 10*3/uL (ref 0.7–4.0)
Lymphocytes Relative: 6 %
MCH: 31.1 pg (ref 26.0–34.0)
MCHC: 35.5 g/dL (ref 30.0–36.0)
MCV: 87.7 fL (ref 78.0–100.0)
MONO ABS: 0.7 10*3/uL (ref 0.1–1.0)
MONOS PCT: 4 %
NEUTROS ABS: 15.3 10*3/uL — AB (ref 1.7–7.7)
Neutrophils Relative %: 90 %
Platelets: 230 10*3/uL (ref 150–400)
RBC: 5.11 MIL/uL (ref 4.22–5.81)
RDW: 12.5 % (ref 11.5–15.5)
WBC: 17.1 10*3/uL — ABNORMAL HIGH (ref 4.0–10.5)

## 2016-07-31 LAB — CBG MONITORING, ED
Glucose-Capillary: 197 mg/dL — ABNORMAL HIGH (ref 65–99)
Glucose-Capillary: 227 mg/dL — ABNORMAL HIGH (ref 65–99)

## 2016-07-31 LAB — TSH: TSH: 0.506 u[IU]/mL (ref 0.350–4.500)

## 2016-07-31 MED ORDER — IOPAMIDOL (ISOVUE-300) INJECTION 61%
INTRAVENOUS | Status: AC
  Administered 2016-07-31: 75 mL via INTRAVENOUS
  Filled 2016-07-31: qty 75

## 2016-07-31 MED ORDER — ONDANSETRON HCL 4 MG/2ML IJ SOLN: 4.0000 mg | Freq: Four times a day (QID) | INTRAMUSCULAR | Status: DC | PRN

## 2016-07-31 MED ORDER — CLONIDINE HCL 0.1 MG PO TABS
0.1000 mg | ORAL_TABLET | Freq: Two times a day (BID) | ORAL | Status: DC
  Administered 2016-07-31 – 2016-08-02 (×5): 0.1 mg via ORAL
  Filled 2016-07-31 (×5): qty 1

## 2016-07-31 MED ORDER — SODIUM CHLORIDE 0.9 % IV SOLN
INTRAVENOUS | Status: DC
  Administered 2016-07-31: 17:00:00 via INTRAVENOUS

## 2016-07-31 MED ORDER — HYDROCODONE-ACETAMINOPHEN 5-325 MG PO TABS
1.0000 | ORAL_TABLET | ORAL | Status: DC | PRN
  Administered 2016-08-03 – 2016-08-04 (×5): 1 via ORAL
  Filled 2016-07-31 (×5): qty 1

## 2016-07-31 MED ORDER — SENNOSIDES-DOCUSATE SODIUM 8.6-50 MG PO TABS: 1.0000 | ORAL_TABLET | Freq: Every evening | ORAL | Status: DC | PRN

## 2016-07-31 MED ORDER — ONDANSETRON HCL 4 MG PO TABS: 4.0000 mg | ORAL_TABLET | Freq: Four times a day (QID) | ORAL | Status: DC | PRN

## 2016-07-31 MED ORDER — VANCOMYCIN HCL 10 G IV SOLR
2000.0000 mg | Freq: Once | INTRAVENOUS | Status: AC
  Administered 2016-07-31: 2000 mg via INTRAVENOUS
  Filled 2016-07-31 (×2): qty 2000

## 2016-07-31 MED ORDER — INSULIN ASPART 100 UNIT/ML ~~LOC~~ SOLN
0.0000 [IU] | Freq: Three times a day (TID) | SUBCUTANEOUS | Status: DC
  Administered 2016-07-31: 2 [IU] via SUBCUTANEOUS
  Administered 2016-08-01: 3 [IU] via SUBCUTANEOUS
  Administered 2016-08-01: 5 [IU] via SUBCUTANEOUS
  Filled 2016-07-31: qty 1

## 2016-07-31 MED ORDER — SODIUM CHLORIDE 0.9 % IV BOLUS (SEPSIS)
1000.0000 mL | Freq: Once | INTRAVENOUS | Status: AC
  Administered 2016-07-31: 1000 mL via INTRAVENOUS

## 2016-07-31 MED ORDER — AMLODIPINE BESYLATE 5 MG PO TABS
5.0000 mg | ORAL_TABLET | Freq: Every day | ORAL | Status: DC
  Administered 2016-08-01: 5 mg via ORAL
  Filled 2016-07-31: qty 1

## 2016-07-31 MED ORDER — RAMIPRIL 10 MG PO CAPS: 10.0000 mg | ORAL_CAPSULE | Freq: Every day | ORAL | Status: DC

## 2016-07-31 MED ORDER — BISACODYL 10 MG RE SUPP: 10.0000 mg | Freq: Every day | RECTAL | Status: DC | PRN

## 2016-07-31 MED ORDER — ENOXAPARIN SODIUM 40 MG/0.4ML ~~LOC~~ SOLN
40.0000 mg | Freq: Every day | SUBCUTANEOUS | Status: DC
  Administered 2016-07-31 – 2016-08-04 (×5): 40 mg via SUBCUTANEOUS
  Filled 2016-07-31 (×5): qty 0.4

## 2016-07-31 MED ORDER — PIPERACILLIN-TAZOBACTAM 3.375 G IVPB
3.3750 g | Freq: Three times a day (TID) | INTRAVENOUS | Status: DC
  Administered 2016-07-31 – 2016-08-01 (×2): 3.375 g via INTRAVENOUS
  Filled 2016-07-31 (×4): qty 50

## 2016-07-31 MED ORDER — ASPIRIN EC 81 MG PO TBEC
81.0000 mg | DELAYED_RELEASE_TABLET | Freq: Every day | ORAL | Status: DC
  Administered 2016-08-01 – 2016-08-05 (×4): 81 mg via ORAL
  Filled 2016-07-31 (×4): qty 1

## 2016-07-31 MED ORDER — DEXAMETHASONE SODIUM PHOSPHATE 10 MG/ML IJ SOLN
10.0000 mg | Freq: Once | INTRAMUSCULAR | Status: AC
  Administered 2016-07-31: 10 mg via INTRAVENOUS
  Filled 2016-07-31: qty 1

## 2016-07-31 MED ORDER — DEXAMETHASONE SODIUM PHOSPHATE 10 MG/ML IJ SOLN
4.0000 mg | Freq: Two times a day (BID) | INTRAMUSCULAR | Status: AC
  Administered 2016-07-31 – 2016-08-02 (×4): 4 mg via INTRAVENOUS
  Filled 2016-07-31: qty 1
  Filled 2016-07-31 (×3): qty 0.4

## 2016-07-31 MED ORDER — SODIUM CHLORIDE 0.9 % IV BOLUS (SEPSIS): 1000.0000 mL | Freq: Once | INTRAVENOUS | Status: DC

## 2016-07-31 MED ORDER — ACETAMINOPHEN 325 MG PO TABS
650.0000 mg | ORAL_TABLET | Freq: Four times a day (QID) | ORAL | Status: DC | PRN
  Administered 2016-08-03 – 2016-08-05 (×4): 650 mg via ORAL
  Filled 2016-07-31 (×4): qty 2

## 2016-07-31 MED ORDER — VANCOMYCIN HCL IN DEXTROSE 1-5 GM/200ML-% IV SOLN
1000.0000 mg | Freq: Three times a day (TID) | INTRAVENOUS | Status: DC
  Administered 2016-08-01: 1000 mg via INTRAVENOUS
  Filled 2016-07-31 (×3): qty 200

## 2016-07-31 MED ORDER — IPRATROPIUM-ALBUTEROL 0.5-2.5 (3) MG/3ML IN SOLN: 3.0000 mL | Freq: Four times a day (QID) | RESPIRATORY_TRACT | Status: DC | PRN

## 2016-07-31 MED ORDER — ACETAMINOPHEN 650 MG RE SUPP: 650.0000 mg | Freq: Four times a day (QID) | RECTAL | Status: DC | PRN

## 2016-07-31 MED ORDER — HYDRALAZINE HCL 20 MG/ML IJ SOLN: 5.0000 mg | Freq: Three times a day (TID) | INTRAMUSCULAR | Status: DC | PRN

## 2016-07-31 MED ORDER — KETOROLAC TROMETHAMINE 15 MG/ML IJ SOLN: 15.0000 mg | Freq: Four times a day (QID) | INTRAMUSCULAR | Status: DC | PRN

## 2016-07-31 MED ORDER — PIPERACILLIN-TAZOBACTAM 3.375 G IVPB 30 MIN
3.3750 g | Freq: Once | INTRAVENOUS | Status: AC
  Administered 2016-07-31: 3.375 g via INTRAVENOUS
  Filled 2016-07-31: qty 50

## 2016-07-31 NOTE — ED Notes (Signed)
Called pharmacy for vancomycin

## 2016-07-31 NOTE — Progress Notes (Signed)
Pharmacy Antibiotic Note  Timothy Page is a 61 y.o. male admitted on 07/31/2016 with subglottic swelling.  Pharmacy has been consulted for vancomycin and Zosyn dosing. Patient is afebrile, WBC elevated at 17.1, CrCl ~95 ml/min  Plan: Vancomycin 2g x1 then 1g IV every 8 hours.  Goal trough 15-20 mcg/mL.  Zosyn 3.375g IV Q8H Monitor renal function, vanc trough at steady state  Height: 5\' 11"  (180.3 cm) Weight: 220 lb (99.8 kg) IBW/kg (Calculated) : 75.3  Temp (24hrs), Avg:98.7 F (37.1 C), Min:98.6 F (37 C), Max:98.7 F (37.1 C)   Recent Labs Lab 07/31/16 1422 07/31/16 1433  WBC 17.1*  --   CREATININE  --  1.00  LATICACIDVEN  --  1.83    Estimated Creatinine Clearance: 95.7 mL/min (by C-G formula based on SCr of 1 mg/dL).    Antimicrobials this admission: 6/17 vanc >>  6/17 Zosyn >>   Dose adjustments this admission: n/a  Microbiology results: n/a  Thank you for allowing pharmacy to be a part of this patient's care.   Gwenlyn Perking, PharmD PGY1 Pharmacy Resident Pager: [phone] 07/31/2016 4:47 PM

## 2016-07-31 NOTE — ED Triage Notes (Signed)
Pt. Stated, Timothy Page had a sore throat to the point of it feels like its making it hard to breath, Im having ear pain, and I feel so swollen in my mouth and throat.  Started 2 days ago.

## 2016-07-31 NOTE — ED Notes (Signed)
Sent pharmacy message to send lovenox

## 2016-07-31 NOTE — H&P (Signed)
History and Physical    Timothy Page [id number] DOB: 01-16-1956 DOA: 07/31/2016   PCP: Hendricks Limes, MD   Patient coming from:  Home    Chief Complaint: Right neck pain and mass   HPI: Timothy Page is a 61 y.o. male with medical history significant for HTN, diabetes, prior history of MI, history of colon polyps, asthma, presenting with right neck severe pain, and enlarging mass. Patient reports that had been seen by Dr. Huey Bienenstock 1 year ago, for the evaluation of enlarging right neck mass, which was to be monitored on a regular basis. The patient however has not been followed up over the last year. He reports that this mass has been slowly enlarging, but over the last 3 days, the mass increased significantly in size, causing him to have more wheezing, and respiratory discomfort. He denies any significant trouble swallowing. He denies any other masses on the axillary area, inguinal area, or other regions. The patient denies any fever or chills night sweats, or recent infections. He denies any recent travels. He denies any history of HIV. He denies any nausea otalgia on the R . He is very fatigue. The patient has extensive family history of cancer, and 1 and has a history of thyroid cancer, and is currently being treated. No history of lymphoma in the family.    ED Course:  BP (!) 179/94   Pulse 96   Temp 98.7 F (37.1 C) (Oral)   Resp 18   Ht 5\' 11"  (1.803 m)   Wt 99.8 kg (220 lb)   SpO2 95%   BMI 30.68 kg/m    CT of the neck shows markedly large heterogeneous thyroid, likely related to thyroid goiter. There is a secondary mass effect on this o'clock tick trachea narrowing to 5 mm transverse diameter. There is no other acute abnormalities found in the neck. Chest x-ray is pending. White count 17 otherwise normal CBC CMET is normal. Glucose is 174. Patient received  Vanc ans Zosyn per ENT recommendations. ENT is to see the patient   in consultation with further  recommendations, anticipating surgery within the next few days.  Review of Systems:  As per HPI otherwise all other systems reviewed and are negative  Past Medical History:  Diagnosis Date  . Arthritis   . Heart attack [facility]) 1980   age 56 X 2 over 3 month period  . Hx of adenomatous polyp of colon 07/02/2009  . Lipoma    back  . Seasonal allergies     Past Surgical History:  Procedure Laterality Date  . CARDIAC CATHETERIZATION  1980  . finger amputaion     with reattachement  . LASIK      Social History Social History   Social History  . Marital status: Married    Spouse name: N/A  . Number of children: N/A  . Years of education: N/A   Occupational History  . Not on file.   Social History Main Topics  . Smoking status: Never Smoker  . Smokeless tobacco: Current User    Types: Snuff  . Alcohol use No  . Drug use: No  . Sexual activity: Not on file   Other Topics Concern  . Not on file   Social History Narrative  . No narrative on file     Allergies  Allergen Reactions  . Tenormin [Atenolol]     Severe "slowing"of functioning  . Tetracycline     Rash Because of a history of documented  adverse serious drug reaction;Medi Alert bracelet  is recommended  . Lipitor [Atorvastatin]     D/Ced by him due to Myalgias in Sept 2014    Family History  Problem Relation Age of Onset  . Diabetes Mother   . Cancer Mother        lung  . COPD Mother   . Brain cancer Mother   . Diabetes Father   . Cancer Father        mesothelioma; skin  . Mental illness Father   . Heart disease Maternal Grandmother        MI @ 7  . Heart disease Maternal Grandfather        MI @ 26  . Cancer Paternal Grandmother   . Heart disease Paternal Grandfather        MI @ 3  . Thyroid cancer Maternal Aunt       Prior to Admission medications   Medication Sig Start Date End Date Taking? Authorizing Provider  amLODipine (NORVASC) 5 MG tablet Take 1 tablet by mouth  twice a  day Patient taking differently: Take 5 mg by mouth daily. Take 1 tablet by mouth  twice a day 11/26/14  Yes Hendricks Limes, MD  aspirin 81 MG tablet Take 81 mg by mouth daily.   Yes [provider]  aspirin EC 81 MG tablet Take 81 mg by mouth daily.   Yes [provider]  BAYER MICROLET LANCETS lancets USE AS DIRECTED 09/28/12  Yes Hendricks Limes, MD  cloNIDine (CATAPRES) 0.1 MG tablet Take 1 tablet (0.1 mg total) by mouth 2 (two) times daily. 11/24/14  Yes Hendricks Limes, MD  Ibuprofen (ADVIL PO) Take by mouth as needed.   Yes [provider]  metFORMIN (GLUCOPHAGE) 500 MG tablet Take 1 tablet by mouth  every day with largest meal Patient taking differently: Take 500 mg by mouth 2 (two) times daily with a meal. Take 1 tablet by mouth  every day with largest meal 11/24/14  Yes Hendricks Limes, MD  ramipril (ALTACE) 10 MG capsule TAKE 1 CAPSULE BY MOUTH  ONCE DAILY 11/24/14  Yes Hendricks Limes, MD  albuterol (PROVENTIL HFA;VENTOLIN HFA) 108 (90 Base) MCG/ACT inhaler Inhale 2 puffs into the lungs every 6 (six) hours as needed for wheezing or shortness of breath. Patient not taking: Reported on 07/31/2016 03/06/15   Rubbie Battiest, NP    Physical Exam:  Vitals:   07/31/16 1445 07/31/16 1534 07/31/16 1545 07/31/16 1600  BP: (!) 188/94 (!) 197/101 (!) 167/111 (!) 179/94  Pulse: (!) 102 97 96   Resp: (!) 22 20 18    Temp:      TempSrc:      SpO2: 95% 95% 95%   Weight:      Height:       Constitutional: NAD, calm, tearful  Eyes: PERRL, lids and conjunctivae normal ENMT: Mucous membranes are moist, without exudate or lesions. Poor dentition noted    Neck: R anterior neck with palpable 5x7 cm non mobile, non tender. No other areas of swelling . Respiratory: clear to auscultation bilaterally, rhonchi on the R anterior chest.  no wheezing, no crackles. Normal respiratory effort  Cardiovascular: tachy  rate and rhythm, no murmurs, rubs or gallops. No extremity  edema. 2+ pedal pulses. No carotid bruits.  Abdomen:  obeseSoft, non tender, No hepatosplenomegaly. Bowel sounds positive.  Musculoskeletal: no clubbing / cyanosis. Moves all extremities Skin: no jaundice, No lesions. Lipoma in back, soft,  non tender Neurologic: Sensation intact  Strength equal in all extremities Psychiatric:   Alert and oriented x 3. Tearful      Labs on Admission: I have personally reviewed following labs and imaging studies  CBC:  Recent Labs Lab 07/31/16 1422 07/31/16 1433  WBC 17.1*  --   NEUTROABS 15.3*  --   HGB 15.9 15.6  HCT 44.8 46.0  MCV 87.7  --   PLT 230  --     Basic Metabolic Panel:  Recent Labs Lab 07/31/16 1433  NA 139  K 3.8  CL 101  GLUCOSE 174*  BUN 11  CREATININE 1.00    GFR: Estimated Creatinine Clearance: 95.7 mL/min (by C-G formula based on SCr of 1 mg/dL).  Liver Function Tests: No results for input(s): AST, ALT, ALKPHOS, BILITOT, PROT, ALBUMIN in the last 168 hours. No results for input(s): LIPASE, AMYLASE in the last 168 hours. No results for input(s): AMMONIA in the last 168 hours.  Coagulation Profile: No results for input(s): INR, PROTIME in the last 168 hours.  Cardiac Enzymes: No results for input(s): CKTOTAL, CKMB, CKMBINDEX, TROPONINI in the last 168 hours.  BNP (last 3 results) No results for input(s): PROBNP in the last 8760 hours.  HbA1C: No results for input(s): HGBA1C in the last 72 hours.  CBG: No results for input(s): GLUCAP in the last 168 hours.  Lipid Profile: No results for input(s): CHOL, HDL, LDLCALC, TRIG, CHOLHDL, LDLDIRECT in the last 72 hours.  Thyroid Function Tests:  Recent Labs  07/31/16 1535  TSH 0.506    Anemia Panel: No results for input(s): VITAMINB12, FOLATE, FERRITIN, TIBC, IRON, RETICCTPCT in the last 72 hours.  Urine analysis:    Component Value Date/Time   COLORURINE straw 05/05/2009 1323   APPEARANCEUR Clear 05/05/2009 1323   LABSPEC 1.025 05/05/2009 1323    PHURINE 6.0 05/05/2009 1323   HGBUR negative 05/05/2009 1323   BILIRUBINUR negative 05/05/2009 1323   UROBILINOGEN 0.2 05/05/2009 1323   NITRITE negative 05/05/2009 1323    Sepsis Labs: @LABRCNTIP (procalcitonin:4,lacticidven:4) )No results found for this or any previous visit (from the past 240 hour(s)).   Radiological Exams on Admission: Ct Soft Tissue Neck W Contrast  Result Date: 07/31/2016 CLINICAL DATA:  Initial evaluation for anterior neck swelling with stridor. EXAM: CT NECK WITH CONTRAST TECHNIQUE: Multidetector CT imaging of the neck was performed using the standard protocol following the bolus administration of intravenous contrast. CONTRAST:  53mL ISOVUE-300 IOPAMIDOL (ISOVUE-300) INJECTION 61% COMPARISON:  None available. FINDINGS: Pharynx and larynx: Oral cavity within normal limits without loculated fluid collection or mass lesion. No acute abnormality about the dentition. Palatine tonsils symmetric and normal. Parapharyngeal fat preserved. Nasopharynx normal. Epiglottis normal. Vallecula clear. Retropharyngeal soft tissues within normal limits. Remainder of the hypopharynx and supraglottic larynx within normal limits. True cords fairly symmetric and within normal limits. Salivary glands: Salivary glands including the parotid and submandibular glands are normal. Thyroid: Thyroid is markedly enlarged and heterogeneous in appearance, suggesting thyroid goiter. Right lobe of thyroid is larger than the left. Slight substernal extension on the left into the upper mediastinum. Mass effect on the subglottic trachea which is compressed and narrowed, measuring approximately 5 mm in transverse diameter. No significant inflammatory stranding about the thyroid itself to suggest acute thyroiditis. Lymph nodes: No pathologically enlarged lymph nodes identified within the neck. Vascular: Normal intravascular enhancement seen throughout the neck. Carotid artery's are splayed at around the enlarged  thyroid. Limited intracranial: Unremarkable. Visualized orbits: Globes and orbital soft  tissues within normal limits. Mastoids and visualized paranasal sinuses: Mild mucosal thickening within the right maxillary sinus. Visualized paranasal sinuses are otherwise clear. Mastoids and middle ear cavities are well pneumatized and clear. Skeleton: No acute osseous abnormality. No worrisome lytic or blastic osseous lesions. Moderate degenerative spondylolysis present at C5-6 and C6-7. Upper chest: Visualized upper chest within normal limits. Partially visualized lungs are clear. Other: Incidental note made of a 17 mm hypodense collection within subcutaneous fat of the right posterior neck, likely a sebaceous cyst. No significant associated inflammatory changes. IMPRESSION: 1. Markedly enlarged heterogeneous thyroid, likely related to thyroid goiter. Secondary mass effect on the subglottic trachea which is narrowed to 5 mm in transverse diameter. 2. No other acute abnormality identified within the neck. Results were discussed by telephone at the time of interpretation on 07/31/2016 at approximately 3:45 p.m. with Dr. Zenovia Jarred. Electronically Signed   By: Jeannine Boga M.D.   On: 07/31/2016 16:02    EKG: Independently reviewed.  Assessment/Plan Active Problems:   Goiter   Lipoma   Essential hypertension   Mixed hyperlipidemia   Type 2 diabetes mellitus (HCC)   Hx of adenomatous polyp of colon   Right neck mass consistent with large goiter, causing significant pain and stridor . Markedly enlarged heterogeneous thyroid, likely related to thyroid goiter. Secondary mass effect on the subglottic trachea which is narrowed to 5 mm in transverse diameter.  History of thyroid cancer in family No other acute abnormality identified within the neck. CXR pending.  TSH  Pending. Afebrile. Started on IV VAnc and Zosyn. Received Decadron 10 mg IV x1  WBC 17 . ENT to consult  Admit to SDU  Appreciate ENT  involvement, for surgery within the next few days  IVF  Pain control  Continue IV antibiotics per ENT recommendations  Decadron 4 mg bid IV x2 days  Monitor closely his airway function  Duoneb for wheezing or shortness of breath   Hypertension BP 179/94   Pulse 96   Controlled Continue home anti-hypertensive medications  Add Hydralazine Q6 hours as needed for BP 160/90   Hyperlipidemia Not on meds, check lipid panel   Type II Diabetes Current blood sugar level is 174 Lab Results  Component Value Date   HGBA1C 6.7 (H) 08/25/2014  Hgb A1C Hold home oral diabetic medications.  SSI    DVT prophylaxis: Lovenox  Code Status:   Full     Family Communication:  Discussed with patient Disposition Plan: Expect patient to be discharged to home after condition improves Consults called:    ENT per EDP, Dr. Constance Holster  Admission status SDU   Rondel Jumbo, PA-C [facility]   07/31/2016, 5:18 PM

## 2016-07-31 NOTE — ED Provider Notes (Signed)
[facility] DEPT Provider Note   CSN: [id number] Arrival date & time: 07/31/16  1234     History   Chief Complaint Chief Complaint  Patient presents with  . Sore Throat  . Oral Swelling  . Otalgia    HPI AKIO HUDNALL is a 61 y.o. male.  HPI   History 61 year old male sent here from urgent care. Patient's feels like he has a swelling in his neck that has been having trouble to be completely. He feels that he can't catch his breath completely. This has been going over the last 3 days but got worse overnight. Patient has diffuse dental caries, had a recent cold before all these symptoms started 3 days ago. Feels that he had a temperature, has not measured it.  Past Medical History:  Diagnosis Date  . Arthritis   . Heart attack [facility]) 1980   age 25 X 2 over 8 month period  . Hx of adenomatous polyp of colon 07/02/2009  . Lipoma    back  . Seasonal allergies     Patient Active Problem List   Diagnosis Date Noted  . Cough 03/06/2015  . Type 2 diabetes mellitus ([place]) 08/09/2012  . Essential hypertension 08/07/2012  . Mixed hyperlipidemia 08/07/2012  . CAD (coronary artery disease) 04/28/2011  . Unspecified adverse effect of unspecified drug, medicinal and biological substance 04/28/2011  . Lipoma 03/01/2011  . Tremor 03/01/2011  . Hx of adenomatous polyp of colon 07/02/2009  . UNSPECIFIED PROSTATITIS 05/05/2009    Past Surgical History:  Procedure Laterality Date  . CARDIAC CATHETERIZATION  1980  . finger amputaion     with reattachement  . LASIK         Home Medications    Prior to Admission medications   Medication Sig Start Date End Date Taking? Authorizing Provider  amLODipine (NORVASC) 5 MG tablet Take 1 tablet by mouth  twice a day Patient taking differently: Take 5 mg by mouth daily. Take 1 tablet by mouth  twice a day 11/26/14  Yes Hendricks Limes, MD  aspirin 81 MG tablet Take 81 mg by mouth daily.   Yes [provider]  aspirin EC 81  MG tablet Take 81 mg by mouth daily.   Yes [provider]  BAYER MICROLET LANCETS lancets USE AS DIRECTED 09/28/12  Yes Hendricks Limes, MD  cloNIDine (CATAPRES) 0.1 MG tablet Take 1 tablet (0.1 mg total) by mouth 2 (two) times daily. 11/24/14  Yes Hendricks Limes, MD  Ibuprofen (ADVIL PO) Take by mouth as needed.   Yes [provider]  metFORMIN (GLUCOPHAGE) 500 MG tablet Take 1 tablet by mouth  every day with largest meal Patient taking differently: Take 500 mg by mouth 2 (two) times daily with a meal. Take 1 tablet by mouth  every day with largest meal 11/24/14  Yes Hendricks Limes, MD  ramipril (ALTACE) 10 MG capsule TAKE 1 CAPSULE BY MOUTH  ONCE DAILY 11/24/14  Yes Hendricks Limes, MD  albuterol (PROVENTIL HFA;VENTOLIN HFA) 108 (90 Base) MCG/ACT inhaler Inhale 2 puffs into the lungs every 6 (six) hours as needed for wheezing or shortness of breath. Patient not taking: Reported on 07/31/2016 03/06/15   Rubbie Battiest, NP    Family History Family History  Problem Relation Age of Onset  . Diabetes Mother   . Cancer Mother        lung  . COPD Mother   . Diabetes Father   . Cancer Father  mesothelioma; skin  . Mental illness Father   . Heart disease Maternal Grandmother        MI @ 46  . Heart disease Maternal Grandfather        MI @ 48  . Cancer Paternal Grandmother   . Heart disease Paternal Grandfather        MI @ 1    Social History Social History  Substance Use Topics  . Smoking status: Never Smoker  . Smokeless tobacco: Current User    Types: Snuff  . Alcohol use No     Allergies   Tenormin [atenolol]; Tetracycline; and Lipitor [atorvastatin]   Review of Systems Review of Systems  Constitutional: Positive for fatigue and fever. Negative for activity change.  HENT: Positive for dental problem and sore throat. Negative for drooling.   Respiratory: Positive for shortness of breath.   Cardiovascular: Negative for chest pain.    Gastrointestinal: Negative for abdominal pain.     Physical Exam Updated Vital Signs BP (!) 188/94   Pulse (!) 102   Temp 98.7 F (37.1 C) (Oral)   Resp (!) 22   Ht 5\' 11"  (1.803 m)   Wt 99.8 kg (220 lb)   SpO2 95%   BMI 30.68 kg/m   Physical Exam  Constitutional: He is oriented to person, place, and time. He appears well-nourished.  HENT:  Head: Normocephalic and atraumatic.  Right Ear: External ear normal.  Left Ear: External ear normal.  Diffuse dental caries poor dentition.  Eyes: Conjunctivae are normal.  Neck:  Swelling in the right anterior neck.  Cardiovascular:  Tachycardia.  Pulmonary/Chest: Breath sounds normal. Stridor present. No respiratory distress.  Mild tachypnea.  Neurological: He is oriented to person, place, and time.  Skin: Skin is warm and dry. He is not diaphoretic.  Psychiatric: He has a normal mood and affect. His behavior is normal.     ED Treatments / Results  Labs (all labs ordered are listed, but only abnormal results are displayed) Labs Reviewed  CBC WITH DIFFERENTIAL/PLATELET - Abnormal; Notable for the following:       Result Value   WBC 17.1 (*)    Neutro Abs 15.3 (*)    All other components within normal limits  I-STAT CHEM 8, ED - Abnormal; Notable for the following:    Glucose, Bld 174 (*)    All other components within normal limits  I-STAT CG4 LACTIC ACID, ED    EKG  EKG Interpretation None       Radiology No results found.  Procedures Procedures (including critical care time)  Medications Ordered in ED Medications  dexamethasone (DECADRON) injection 10 mg (10 mg Intravenous Given 07/31/16 1438)  iopamidol (ISOVUE-300) 61 % injection (75 mLs Intravenous Contrast Given 07/31/16 1508)     Initial Impression / Assessment and Plan / ED Course  I have reviewed the triage vital signs and the nursing notes.  Pertinent labs & imaging results that were available during my care of the patient were reviewed by me  and considered in my medical decision making (see chart for details).     Patient is a 61 year old male presenting with swelling anterior neck and stridor. Patient reports he's been having more difficulty breathing since last night. He feels like there is something in his throat that he can't breathe by. He does have swelling to the anterior neck wall. Patient had recent upper respiratory infection/cold.  3:00 pm called CT and had him go first.   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  Called Dr. Constance Holster with ENT.   Dr. Constance Holster thinks it could've been an acute hemorrhage into the goiter. He recommends admission to medicine with broad spectrum antibiotics.  We'll order chest x-ray and admit to stepdown.  Discussed with medicine.   Final Clinical Impressions(s) / ED Diagnoses   Final diagnoses:  None    New Prescriptions New Prescriptions   No medications on file     Macarthur Critchley, MD 08/02/16 2243

## 2016-07-31 NOTE — ED Notes (Signed)
On observation of throat area was red , inflamed.

## 2016-07-31 NOTE — ED Provider Notes (Signed)
CSN: [id number]     Arrival date & time 07/31/16  1201 History   First MD Initiated Contact with Patient 07/31/16 1214     Chief Complaint  Patient presents with  . Cough  . Otalgia   (Consider location/radiation/quality/duration/timing/severity/associated sxs/prior Treatment) Patient c/o right neck mass and severe pain.  He states he also hurts in his right ear.  He has sensation of not being able to breath due to mass cutting off his airway.  He states when he lies down he cannot breathe.  He has a sore throat and difficulty opening up his mouth.  He states he cannot breathe due to the mass in his right neck cutting off his airway.  He states he does have bad teeth.    The history is provided by the patient.  Otalgia  Location:  Right Behind ear:  No abnormality Quality:  Aching Severity:  Moderate Onset quality:  Sudden Duration:  3 days Timing:  Constant Progression:  Worsening Chronicity:  New Relieved by:  Nothing Worsened by:  Nothing Ineffective treatments:  None tried Associated symptoms: sore throat     Past Medical History:  Diagnosis Date  . Arthritis   . Heart attack [facility]) 1980   age 66 X 2 over 72 month period  . Hx of adenomatous polyp of colon 07/02/2009  . Lipoma    back  . Seasonal allergies    Past Surgical History:  Procedure Laterality Date  . CARDIAC CATHETERIZATION  1980  . finger amputaion     with reattachement  . LASIK     Family History  Problem Relation Age of Onset  . Diabetes Mother   . Cancer Mother        lung  . COPD Mother   . Diabetes Father   . Cancer Father        mesothelioma; skin  . Mental illness Father   . Heart disease Maternal Grandmother        MI @ 30  . Heart disease Maternal Grandfather        MI @ 62  . Cancer Paternal Grandmother   . Heart disease Paternal Grandfather        MI @ 22   Social History  Substance Use Topics  . Smoking status: Never Smoker  . Smokeless tobacco: Current User    Types:  Snuff  . Alcohol use No    Review of Systems  Constitutional: Positive for fatigue.  HENT: Positive for ear pain and sore throat.   Eyes: Negative.   Respiratory: Negative.   Cardiovascular: Negative.   Gastrointestinal: Negative.   Endocrine: Negative.   Genitourinary: Negative.   Musculoskeletal: Negative.   Allergic/Immunologic: Negative.   Neurological: Negative.   Hematological: Negative.   Psychiatric/Behavioral: Negative.     Allergies  Tenormin [atenolol]; Tetracycline; and Lipitor [atorvastatin]  Home Medications   Prior to Admission medications   Medication Sig Start Date End Date Taking? Authorizing Provider  albuterol (PROVENTIL HFA;VENTOLIN HFA) 108 (90 Base) MCG/ACT inhaler Inhale 2 puffs into the lungs every 6 (six) hours as needed for wheezing or shortness of breath. 03/06/15  Yes Rubbie Battiest, NP  amLODipine (NORVASC) 5 MG tablet Take 1 tablet by mouth  twice a day 11/26/14  Yes Hendricks Limes, MD  aspirin 81 MG tablet Take 81 mg by mouth daily.   Yes [provider]  BAYER MICROLET LANCETS lancets USE AS DIRECTED 09/28/12  Yes Hendricks Limes, MD  cloNIDine (CATAPRES)  0.1 MG tablet Take 1 tablet (0.1 mg total) by mouth 2 (two) times daily. 11/24/14  Yes Hendricks Limes, MD  Ibuprofen (ADVIL PO) Take by mouth as needed.   Yes [provider]  metFORMIN (GLUCOPHAGE) 500 MG tablet Take 1 tablet by mouth  every day with largest meal 11/24/14  Yes Hendricks Limes, MD  ramipril (ALTACE) 10 MG capsule TAKE 1 CAPSULE BY MOUTH  ONCE DAILY 11/24/14  Yes Hendricks Limes, MD   Meds Ordered and Administered this Visit  Medications - No data to display  BP (!) 186/125 (BP Location: Left Arm)   Pulse (!) 113   Temp 98.6 F (37 C) (Oral)   Resp 18   SpO2 95%  No data found.   Physical Exam  Constitutional: He appears well-developed and well-nourished.  HENT:  Head: Normocephalic and atraumatic.  Left Ear: External ear normal.  OPX  difficult to examine due to trismus.  Right ear with pain with speculum exam but EAC appears normal.  TTP right TMJ and right neck.  Eyes: Conjunctivae and EOM are normal. Pupils are equal, round, and reactive to light.  Neck:  Right anterior neck mass that is approximately the size of baseball.  No stridor, no bruit,  Decreased ROM of neck. TTP right neck up to right ear.  Cardiovascular: Normal rate, regular rhythm and normal heart sounds.   Pulmonary/Chest: Effort normal and breath sounds normal.  Nursing note and vitals reviewed.   Urgent Care Course     Procedures (including critical care time)  Labs Review Labs Reviewed - No data to display  Imaging Review No results found.   Visual Acuity Review  Right Eye Distance:   Left Eye Distance:   Bilateral Distance:    Right Eye Near:   Left Eye Near:    Bilateral Near:         MDM   1. SOB (shortness of breath)   2. Mass of right side of neck   3. Right ear pain   4. Neck pain on right side   5. Essential hypertension    Explained to patient that he needs to be seen in the ED for higher level of care.      Lysbeth Penner, FNP 07/31/16 1236

## 2016-07-31 NOTE — ED Triage Notes (Signed)
The patient presented to the [facility] with a complaint of a cough, right side ear pain and a sore throat x 3 days.

## 2016-07-31 NOTE — ED Notes (Signed)
Pt order liquid dinner tray, eating at bedside

## 2016-07-31 NOTE — ED Notes (Signed)
Patient transported to CT 

## 2016-08-01 ENCOUNTER — Inpatient Hospital Stay (HOSPITAL_COMMUNITY): Payer: 59

## 2016-08-01 LAB — LIPID PANEL
CHOL/HDL RATIO: 4.8 ratio
Cholesterol: 177 mg/dL (ref 0–200)
HDL: 37 mg/dL — AB (ref >40)
LDL CALC: 122 mg/dL — AB (ref 0–99)
Triglycerides: 88 mg/dL (ref <150)
VLDL: 18 mg/dL (ref 0–40)

## 2016-08-01 LAB — GLUCOSE, CAPILLARY
GLUCOSE-CAPILLARY: 230 mg/dL — AB (ref 65–99)
Glucose-Capillary: 254 mg/dL — ABNORMAL HIGH (ref 65–99)
Glucose-Capillary: 304 mg/dL — ABNORMAL HIGH (ref 65–99)
Glucose-Capillary: 152 mg/dL — ABNORMAL HIGH (ref 65–99)

## 2016-08-01 LAB — COMPREHENSIVE METABOLIC PANEL
ALBUMIN: 4 g/dL (ref 3.5–5.0)
ALT: 16 U/L — ABNORMAL LOW (ref 17–63)
ANION GAP: 10 (ref 5–15)
AST: 27 U/L (ref 15–41)
Alkaline Phosphatase: 85 U/L (ref 38–126)
BUN: 13 mg/dL (ref 6–20)
CHLORIDE: 106 mmol/L (ref 101–111)
CO2: 21 mmol/L — AB (ref 22–32)
Calcium: 9.1 mg/dL (ref 8.9–10.3)
Creatinine, Ser: 1.13 mg/dL (ref 0.61–1.24)
GFR calc Af Amer: >60 mL/min (ref >60)
GFR calc non Af Amer: >60 mL/min (ref >60)
GLUCOSE: 254 mg/dL — AB (ref 65–99)
POTASSIUM: 4.6 mmol/L (ref 3.5–5.1)
SODIUM: 137 mmol/L (ref 135–145)
TOTAL PROTEIN: 6.6 g/dL (ref 6.5–8.1)
Total Bilirubin: 1.4 mg/dL — ABNORMAL HIGH (ref 0.3–1.2)

## 2016-08-01 LAB — MRSA PCR SCREENING: MRSA by PCR: NEGATIVE

## 2016-08-01 LAB — HIV ANTIBODY (ROUTINE TESTING W REFLEX): HIV Screen 4th Generation wRfx: NONREACTIVE

## 2016-08-01 LAB — HEMOGLOBIN A1C
HEMOGLOBIN A1C: 6.5 % — AB (ref 4.8–5.6)
Mean Plasma Glucose: 140 mg/dL

## 2016-08-01 LAB — CBC
HEMATOCRIT: 42 % (ref 39.0–52.0)
HEMOGLOBIN: 15.1 g/dL (ref 13.0–17.0)
MCH: 32.2 pg (ref 26.0–34.0)
MCHC: 36 g/dL (ref 30.0–36.0)
MCV: 89.6 fL (ref 78.0–100.0)
Platelets: 221 10*3/uL (ref 150–400)
RBC: 4.69 MIL/uL (ref 4.22–5.81)
RDW: 13 % (ref 11.5–15.5)
WBC: 16.6 10*3/uL — ABNORMAL HIGH (ref 4.0–10.5)

## 2016-08-01 LAB — PROTIME-INR
INR: 1.14
Prothrombin Time: 14.6 seconds (ref 11.4–15.2)

## 2016-08-01 MED ORDER — AMLODIPINE BESYLATE 10 MG PO TABS
10.0000 mg | ORAL_TABLET | Freq: Every day | ORAL | Status: DC
  Administered 2016-08-02 – 2016-08-05 (×4): 10 mg via ORAL
  Filled 2016-08-01 (×3): qty 1

## 2016-08-01 MED ORDER — SILVER NITRATE-POT NITRATE 75-25 % EX MISC
1.0000 | Freq: Once | CUTANEOUS | Status: DC | PRN
  Filled 2016-08-01: qty 1

## 2016-08-01 MED ORDER — LIDOCAINE-EPINEPHRINE (PF) 1 %-1:200000 IJ SOLN
0.0000 mL | Freq: Once | INTRAMUSCULAR | Status: DC | PRN
  Filled 2016-08-01: qty 30

## 2016-08-01 MED ORDER — INSULIN ASPART 100 UNIT/ML ~~LOC~~ SOLN
0.0000 [IU] | Freq: Three times a day (TID) | SUBCUTANEOUS | Status: DC
  Administered 2016-08-01: 7 [IU] via SUBCUTANEOUS
  Administered 2016-08-02: 3 [IU] via SUBCUTANEOUS
  Administered 2016-08-02: 2 [IU] via SUBCUTANEOUS
  Administered 2016-08-02: 5 [IU] via SUBCUTANEOUS
  Administered 2016-08-03: 3 [IU] via SUBCUTANEOUS
  Administered 2016-08-04 (×3): 1 [IU] via SUBCUTANEOUS
  Administered 2016-08-05: 2 [IU] via SUBCUTANEOUS

## 2016-08-01 MED ORDER — LIDOCAINE HCL 4 % EX SOLN
0.0000 mL | Freq: Once | CUTANEOUS | Status: DC | PRN
  Filled 2016-08-01: qty 50

## 2016-08-01 MED ORDER — OXYMETAZOLINE HCL 0.05 % NA SOLN
1.0000 | Freq: Once | NASAL | Status: DC | PRN
  Filled 2016-08-01: qty 15

## 2016-08-01 MED ORDER — INSULIN ASPART 100 UNIT/ML ~~LOC~~ SOLN
0.0000 [IU] | Freq: Every day | SUBCUTANEOUS | Status: DC
  Administered 2016-08-02: 2 [IU] via SUBCUTANEOUS
  Administered 2016-08-03: 3 [IU] via SUBCUTANEOUS

## 2016-08-01 MED ORDER — TRIPLE ANTIBIOTIC 3.5-400-5000 EX OINT
1.0000 "application " | TOPICAL_OINTMENT | Freq: Once | CUTANEOUS | Status: DC | PRN
  Filled 2016-08-01: qty 1

## 2016-08-01 MED ORDER — LIDOCAINE HCL 2 % EX GEL
1.0000 "application " | Freq: Once | CUTANEOUS | Status: DC | PRN
  Filled 2016-08-01: qty 5

## 2016-08-01 MED ORDER — INSULIN GLARGINE 100 UNIT/ML ~~LOC~~ SOLN
5.0000 [IU] | Freq: Every day | SUBCUTANEOUS | Status: DC
  Administered 2016-08-01 – 2016-08-05 (×4): 5 [IU] via SUBCUTANEOUS
  Filled 2016-08-01 (×5): qty 0.05

## 2016-08-01 NOTE — Progress Notes (Signed)
Inpatient Diabetes Program Recommendations  AACE/ADA: New Consensus Statement on Inpatient Glycemic Control (2015)  Target Ranges:  Prepandial:   less than 140 mg/dL      Peak postprandial:   less than 180 mg/dL (1-2 hours)      Critically ill patients:  140 - 180 mg/dL   Lab Results  Component Value Date   GLUCAP 230 (H) 08/01/2016   HGBA1C 6.7 (H) 08/25/2014    Review of Glycemic Control Results for Timothy Page, Timothy Page (MRN [id number]) as of 08/01/2016 09:33  Ref. Range 07/31/2016 17:20 07/31/2016 22:18 08/01/2016 08:02  Glucose-Capillary Latest Ref Range: 65 - 99 mg/dL 197 (H) 227 (H) 230 (H)   Diabetes history: DM2 Outpatient Diabetes medications: Metformin 500 mg qd Current orders for Inpatient glycemic control: Novolog correction 0-9 units tid  Inpatient Diabetes Program Recommendations:  Noted elevated CBGs and pending A1c. Please consider: -Increase Novolog correction to q 4 hrs while in ICU  Thank you, Nani Gasser. Gaither Biehn, RN, MSN, CDE  Diabetes Coordinator Inpatient Glycemic Control Team Team Pager [phone] (8am-5pm) 08/01/2016 9:36 AM

## 2016-08-01 NOTE — Consult Note (Signed)
Reason for Consult:Thyroid mass Referring Physician: Modena Jansky, MD  Timothy Page is an 61 y.o. male.  HPI: He was admitted last night due to difficulty breathing.He had a thyroid enlargement identified about a year ago by his primary care physician who is since retired. He didn't have any follow-up after that. He no longer had a primary care doctor. About 3 months ago he started to notice some difficulty with his breathing and couple of days ago it got much worse to the point where he was unable to lie flat on his back. He would wake him up at night due to difficulty breathing. He is having some trouble Swallowing as well.  Past Medical History:  Diagnosis Date  . Arthritis   . Heart attack [facility]) 1980   age 4 X 2 over 58 month period  . Hx of adenomatous polyp of colon 07/02/2009  . Lipoma    back  . Seasonal allergies     Past Surgical History:  Procedure Laterality Date  . CARDIAC CATHETERIZATION  1980  . finger amputaion     with reattachement  . LASIK      Family History  Problem Relation Age of Onset  . Diabetes Mother   . Cancer Mother        lung  . COPD Mother   . Brain cancer Mother   . Diabetes Father   . Cancer Father        mesothelioma; skin  . Mental illness Father   . Heart disease Maternal Grandmother        MI @ 41  . Heart disease Maternal Grandfather        MI @ 6  . Cancer Paternal Grandmother   . Heart disease Paternal Grandfather        MI @ 42  . Thyroid cancer Maternal Aunt     Social History:  reports that he has never smoked. His smokeless tobacco use includes Snuff. He reports that he does not drink alcohol or use drugs.  Allergies:  Allergies  Allergen Reactions  . Tenormin [Atenolol]     Severe "slowing"of functioning  . Tetracycline     Rash Because of a history of documented adverse serious drug reaction;Medi Alert bracelet  is recommended  . Lipitor [Atorvastatin]     D/Ced by him due to Myalgias in Sept 2014     Medications: Reviewed  Results for orders placed or performed during the hospital encounter of 07/31/16 (from the past 48 hour(s))  CBC with Differential/Platelet     Status: Abnormal   Collection Time: 07/31/16  2:22 PM  Result Value Ref Range   WBC 17.1 (H) 4.0 - 10.5 K/uL   RBC 5.11 4.22 - 5.81 MIL/uL   Hemoglobin 15.9 13.0 - 17.0 g/dL   HCT 44.8 39.0 - 52.0 %   MCV 87.7 78.0 - 100.0 fL   MCH 31.1 26.0 - 34.0 pg   MCHC 35.5 30.0 - 36.0 g/dL   RDW 12.5 11.5 - 15.5 %   Platelets 230 150 - 400 K/uL   Neutrophils Relative % 90 %   Neutro Abs 15.3 (H) 1.7 - 7.7 K/uL   Lymphocytes Relative 6 %   Lymphs Abs 1.1 0.7 - 4.0 K/uL   Monocytes Relative 4 %   Monocytes Absolute 0.7 0.1 - 1.0 K/uL   Eosinophils Relative 0 %   Eosinophils Absolute 0.0 0.0 - 0.7 K/uL   Basophils Relative 0 %   Basophils Absolute 0.0  0.0 - 0.1 K/uL  I-Stat CG4 Lactic Acid, ED     Status: None   Collection Time: 07/31/16  2:33 PM  Result Value Ref Range   Lactic Acid, Venous 1.83 0.5 - 1.9 mmol/L  I-stat chem 8, ed     Status: Abnormal   Collection Time: 07/31/16  2:33 PM  Result Value Ref Range   Sodium 139 135 - 145 mmol/L   Potassium 3.8 3.5 - 5.1 mmol/L   Chloride 101 101 - 111 mmol/L   BUN 11 6 - 20 mg/dL   Creatinine, Ser 1.00 0.61 - 1.24 mg/dL   Glucose, Bld 174 (H) 65 - 99 mg/dL   Calcium, Ion 1.17 1.15 - 1.40 mmol/L   TCO2 26 0 - 100 mmol/L   Hemoglobin 15.6 13.0 - 17.0 g/dL   HCT 46.0 39.0 - 52.0 %  TSH     Status: None   Collection Time: 07/31/16  3:35 PM  Result Value Ref Range   TSH 0.506 0.350 - 4.500 uIU/mL    Comment: Performed by a 3rd Generation assay with a functional sensitivity of <=0.01 uIU/mL.  Hemoglobin A1c     Status: Abnormal   Collection Time: 07/31/16  4:49 PM  Result Value Ref Range   Hgb A1c MFr Bld 6.5 (H) 4.8 - 5.6 %    Comment: (NOTE)         Pre-diabetes: 5.7 - 6.4         Diabetes: >6.4         Glycemic control for adults with diabetes: <7.0    Mean  Plasma Glucose 140 mg/dL    Comment: (NOTE) Performed At: [facility] [address] [place], [place] [id number] Lindon Romp MD [id number]   I-Stat CG4 Lactic Acid, ED     Status: None   Collection Time: 07/31/16  5:04 PM  Result Value Ref Range   Lactic Acid, Venous 1.44 0.5 - 1.9 mmol/L  CBG monitoring, ED     Status: Abnormal   Collection Time: 07/31/16  5:20 PM  Result Value Ref Range   Glucose-Capillary 197 (H) 65 - 99 mg/dL   Comment 1 Notify RN   Urinalysis, Routine w reflex microscopic     Status: Abnormal   Collection Time: 07/31/16  7:36 PM  Result Value Ref Range   Color, Urine COLORLESS (A) YELLOW   APPearance CLEAR CLEAR   Specific Gravity, Urine 1.010 1.005 - 1.030   pH 7.0 5.0 - 8.0   Glucose, UA >=500 (A) NEGATIVE mg/dL   Hgb urine dipstick NEGATIVE NEGATIVE   Bilirubin Urine NEGATIVE NEGATIVE   Ketones, ur NEGATIVE NEGATIVE mg/dL   Protein, ur NEGATIVE NEGATIVE mg/dL   Nitrite NEGATIVE NEGATIVE   Leukocytes, UA NEGATIVE NEGATIVE   RBC / HPF 0-5 0 - 5 RBC/hpf   WBC, UA 0-5 0 - 5 WBC/hpf   Bacteria, UA NONE SEEN NONE SEEN   Squamous Epithelial / LPF NONE SEEN NONE SEEN  CBG monitoring, ED     Status: Abnormal   Collection Time: 07/31/16 10:18 PM  Result Value Ref Range   Glucose-Capillary 227 (H) 65 - 99 mg/dL  MRSA PCR Screening     Status: None   Collection Time: 08/01/16  3:13 AM  Result Value Ref Range   MRSA by PCR NEGATIVE NEGATIVE    Comment:        The GeneXpert MRSA Assay (FDA approved for NASAL specimens only), is one component of a comprehensive MRSA colonization  surveillance program. It is not intended to diagnose MRSA infection nor to guide or monitor treatment for MRSA infections.   Comprehensive metabolic panel     Status: Abnormal   Collection Time: 08/01/16  5:31 AM  Result Value Ref Range   Sodium 137 135 - 145 mmol/L   Potassium 4.6 3.5 - 5.1 mmol/L    Comment: DELTA CHECK NOTED HEMOLYSIS AT THIS LEVEL MAY  AFFECT RESULT    Chloride 106 101 - 111 mmol/L   CO2 21 (L) 22 - 32 mmol/L   Glucose, Bld 254 (H) 65 - 99 mg/dL   BUN 13 6 - 20 mg/dL   Creatinine, Ser 1.13 0.61 - 1.24 mg/dL   Calcium 9.1 8.9 - 10.3 mg/dL   Total Protein 6.6 6.5 - 8.1 g/dL   Albumin 4.0 3.5 - 5.0 g/dL   AST 27 15 - 41 U/L   ALT 16 (L) 17 - 63 U/L   Alkaline Phosphatase 85 38 - 126 U/L   Total Bilirubin 1.4 (H) 0.3 - 1.2 mg/dL   GFR calc non Af Amer >60 >60 mL/min   GFR calc Af Amer >60 >60 mL/min    Comment: (NOTE) The eGFR has been calculated using the CKD EPI equation. This calculation has not been validated in all clinical situations. eGFR's persistently <60 mL/min signify possible Chronic Kidney Disease.    Anion gap 10 5 - 15  CBC     Status: Abnormal   Collection Time: 08/01/16  5:31 AM  Result Value Ref Range   WBC 16.6 (H) 4.0 - 10.5 K/uL   RBC 4.69 4.22 - 5.81 MIL/uL   Hemoglobin 15.1 13.0 - 17.0 g/dL   HCT 42.0 39.0 - 52.0 %   MCV 89.6 78.0 - 100.0 fL   MCH 32.2 26.0 - 34.0 pg   MCHC 36.0 30.0 - 36.0 g/dL   RDW 13.0 11.5 - 15.5 %   Platelets 221 150 - 400 K/uL  Protime-INR     Status: None   Collection Time: 08/01/16  5:31 AM  Result Value Ref Range   Prothrombin Time 14.6 11.4 - 15.2 seconds   INR 1.14   Lipid panel     Status: Abnormal   Collection Time: 08/01/16  5:31 AM  Result Value Ref Range   Cholesterol 177 0 - 200 mg/dL   Triglycerides 88 <150 mg/dL   HDL 37 (L) >40 mg/dL   Total CHOL/HDL Ratio 4.8 RATIO   VLDL 18 0 - 40 mg/dL   LDL Cholesterol 122 (H) 0 - 99 mg/dL    Comment:        Total Cholesterol/HDL:CHD Risk Coronary Heart Disease Risk Table                     Men   Women  1/2 Average Risk   3.4   3.3  Average Risk       5.0   4.4  2 X Average Risk   9.6   7.1  3 X Average Risk  23.4   11.0        Use the calculated Patient Ratio above and the CHD Risk Table to determine the patient's CHD Risk.        ATP III CLASSIFICATION (LDL):  <100     mg/dL   Optimal   100-129  mg/dL   Near or Above  Optimal  130-159  mg/dL   Borderline  160-189  mg/dL   High  >190     mg/dL   Very High   Glucose, capillary     Status: Abnormal   Collection Time: 08/01/16  8:02 AM  Result Value Ref Range   Glucose-Capillary 230 (H) 65 - 99 mg/dL   Comment 1 Capillary Specimen    Comment 2 Notify RN     X-ray Chest Pa And Lateral  Result Date: 08/01/2016 CLINICAL DATA:  Shortness of breath. EXAM: CHEST  2 VIEW COMPARISON:  07/31/2016 FINDINGS: Mediastinum and hilar structures normal. Heart size stable. No focal infiltrate. Low lung volumes. No pleural effusion or pneumothorax . Degenerative changes thoracic spine. IMPRESSION: Low lung volumes with mild basilar atelectasis . No acute infiltrate noted. Electronically Signed   By: Marcello Moores  Register   On: 08/01/2016 07:02   Dg Chest 2 View  Result Date: 07/31/2016 CLINICAL DATA:  Sore throat. EXAM: CHEST  2 VIEW COMPARISON:  None. FINDINGS: The heart size and mediastinal contours are within normal limits. Both lungs are clear. The visualized skeletal structures are unremarkable. IMPRESSION: No active cardiopulmonary disease. Electronically Signed   By: Dorise Bullion III M.D   On: 07/31/2016 17:03   Ct Soft Tissue Neck W Contrast  Result Date: 07/31/2016 CLINICAL DATA:  Initial evaluation for anterior neck swelling with stridor. EXAM: CT NECK WITH CONTRAST TECHNIQUE: Multidetector CT imaging of the neck was performed using the standard protocol following the bolus administration of intravenous contrast. CONTRAST:  27m ISOVUE-300 IOPAMIDOL (ISOVUE-300) INJECTION 61% COMPARISON:  None available. FINDINGS: Pharynx and larynx: Oral cavity within normal limits without loculated fluid collection or mass lesion. No acute abnormality about the dentition. Palatine tonsils symmetric and normal. Parapharyngeal fat preserved. Nasopharynx normal. Epiglottis normal. Vallecula clear. Retropharyngeal soft tissues within  normal limits. Remainder of the hypopharynx and supraglottic larynx within normal limits. True cords fairly symmetric and within normal limits. Salivary glands: Salivary glands including the parotid and submandibular glands are normal. Thyroid: Thyroid is markedly enlarged and heterogeneous in appearance, suggesting thyroid goiter. Right lobe of thyroid is larger than the left. Slight substernal extension on the left into the upper mediastinum. Mass effect on the subglottic trachea which is compressed and narrowed, measuring approximately 5 mm in transverse diameter. No significant inflammatory stranding about the thyroid itself to suggest acute thyroiditis. Lymph nodes: No pathologically enlarged lymph nodes identified within the neck. Vascular: Normal intravascular enhancement seen throughout the neck. Carotid artery's are splayed at around the enlarged thyroid. Limited intracranial: Unremarkable. Visualized orbits: Globes and orbital soft tissues within normal limits. Mastoids and visualized paranasal sinuses: Mild mucosal thickening within the right maxillary sinus. Visualized paranasal sinuses are otherwise clear. Mastoids and middle ear cavities are well pneumatized and clear. Skeleton: No acute osseous abnormality. No worrisome lytic or blastic osseous lesions. Moderate degenerative spondylolysis present at C5-6 and C6-7. Upper chest: Visualized upper chest within normal limits. Partially visualized lungs are clear. Other: Incidental note made of a 17 mm hypodense collection within subcutaneous fat of the right posterior neck, likely a sebaceous cyst. No significant associated inflammatory changes. IMPRESSION: 1. Markedly enlarged heterogeneous thyroid, likely related to thyroid goiter. Secondary mass effect on the subglottic trachea which is narrowed to 5 mm in transverse diameter. 2. No other acute abnormality identified within the neck. Results were discussed by telephone at the time of interpretation on  07/31/2016 at approximately 3:45 p.m. with Dr. CZenovia Jarred Electronically Signed   By: BMarland Kitchen  Jeannine Boga M.D.   On: 07/31/2016 16:02    OKP:[id number] except as listed in admit H&P  Blood pressure (!) 149/104, pulse 64, temperature 98.3 F (36.8 C), temperature source Oral, resp. rate 17, height '5\' 11"'  (1.803 m), weight 101.9 kg (224 lb 10.4 oz), SpO2 95 %.  PHYSICAL EXAM: Overall appearance:  Healthy appearing, in no distress, Sitting upright. Attempting to lie him back supine makes it very uncomfortable for him and he develops dyspnea and increasing stridor. Head:  Normocephalic, atraumatic. Ears: External auditory canals are clear; tympanic membranes are intact in the middle ears are free of any effusion. Nose: External nose is healthy in appearance. Internal nasal exam free of any lesions or obstruction. Oral Cavity/Pharynx:  There are no mucosal lesions or masses identified. Larynx/Hypopharynx: Deferred Neuro:  No identifiable neurologic deficits. Neck: Significant thyroid enlargement, right side greater than left. Thyroid is soft. There is no distinct mass palpable outside of the thyroid..  Studies Reviewed: CT neck reviewed.  Procedures: none   Assessment/Plan: Large thyroid goiter. This is causing obstructing symptoms. This is likely benign although there is a small chance of thyroid cancer and a smaller chance of anaplastic cancer. Treatment of choice is going to be total thyroidectomy. We discussed this at great length including risks and benefits including recurrent nerve injury, hypocalcemia, possible need for tracheostomy. We discussed that there is a possible need to keep him intubated overnight after the surgery. He is agreeable. All questions were answered. We will try to schedule for later this week.  Timothy Page 08/01/2016, 9:52 AM

## 2016-08-01 NOTE — Progress Notes (Signed)
   08/01/16 1320  Clinical Encounter Type  Visited With Patient and family together  Visit Type Follow-up  Spiritual Encounters  Spiritual Needs Emotional  Stress Factors  Patient Stress Factors None identified  Family Stress Factors None identified  Checked in with Pt on AD. He stated his wife will be around this evening. Another family member was visiting. Follow up tomorrow.

## 2016-08-01 NOTE — Progress Notes (Signed)
PROGRESS NOTE   Timothy Page  BDZ:[id number]    DOB: [birth year]    DOA: 07/31/2016  PCP: Hendricks Limes, MD   I have briefly reviewed patients previous medical records in [place].  Brief Narrative:  61 year old male with PMH of HTN, DM 2, prior remote MI, asthma, presented to ED with progressively enlarging neck mass and difficulty breathing. He reports thyroid enlargement identified by his PCP a year ago who has since retired (however I'm unable to find note in [facility] regarding thyroid enlargement). He does not follow with her PCP since. Since 3 months, he has noted gradual increase in size of the swelling which progressively got worse a couple days ago especially lying flat, had some cough after eating, difficulty swallowing large bites of food. He denies neck pain, fever, chills, extreme heat or cold, visual disturbances, weight loss. CT confirmed large goiter with mass effect. ENT consulted and plan surgery on 08/03/16.   Assessment & Plan:   Active Problems:   Lipoma   Essential hypertension   Mixed hyperlipidemia   Type 2 diabetes mellitus (HCC)   Hx of adenomatous polyp of colon   Goiter   1. Large goiter with obstructing symptoms: Clinically euthyroid. TSH normal/0.506. ENT consultation appreciated and suspect that this is likely benign with a small chance of malignancy. Recommend total thyroidectomy and plan for 08/03/16. Discussed with Dr. Constance Holster, recommends continued steroids but no indication for IV antibiotics-discontinued. CT neck results as below appreciated. 2. Essential hypertension: Mildly uncontrolled. Increase amlodipine to 10 MG daily. Continue clonidine 0.1 MG twice a day. When necessary IV hydralazine. 3. Uncontrolled DM 2: A1c 6.5 suggests good outpatient control. Uncontrolled here due to steroids. Add 5 units of Lantus daily. Continue SSI. 4. Hyperlipidemia: Not on medications. 5. History of MI: Denies chest pain. 6. Leukocytosis: Unclear etiology but no  clear suspicion of infection. Chest x-ray without infiltrate. UA negative for UTI features. Discontinue antibiotics and monitor.   DVT prophylaxis:  Lovenox Code Status:  full Family Communication:  none at bedside Disposition:  DC home when medically improved.   Consultants:   ENT/Dr. Constance Holster   Procedures:   None  Antimicrobials:   IV vancomycin and Zosyn-discontinued 6/18    Subjective:  seen this morning. Feels better. States that the swelling in his neck is slightly smaller. Reports ongoing difficulty breathing while lying flat and unable to clear secretions. However wants to eat solid consistency food. No neck pain reported.?? Right ear pain.   ROS:  no chest pain or stridor.  Objective:  Vitals:   08/01/16 1300 08/01/16 1400 08/01/16 1415 08/01/16 1417  BP: 129/68 (!) 164/111 (!) 179/91 (!) 158/90  Pulse: 80 (!) 114 (!) 102 (!) 101  Resp: 19 20 15 16   Temp:      TempSrc:      SpO2: 96% 98% 97% 97%  Weight:      Height:        Examination:  General exam: Pleasant middle-aged male, moderately built and obese, lying comfortably propped up in bed. Neck: Diffuse goiter with right lobe >left, no increased warmth, tenderness or pulsations. Respiratory system: Clear to auscultation without stridor . Respiratory effort normal. Cardiovascular system: S1 & S2 heard, RRR. No JVD, murmurs, rubs, gallops or clicks. No pedal edema. Telemetry: Sinus rhythm.  Gastrointestinal system: Abdomen is nondistended, soft and nontender. No organomegaly or masses felt. Normal bowel sounds heard. Central nervous system: Alert and oriented. No focal neurological deficits. Extremities: Symmetric 5 x  5 power. Skin: No rashes, lesions or ulcers Psychiatry: Judgement and insight appear normal. Mood & affect appropriate.     Data Reviewed: I have personally reviewed following labs and imaging studies  CBC:  Recent Labs Lab 07/31/16 1422 07/31/16 1433 08/01/16 0531  WBC 17.1*  --  16.6*   NEUTROABS 15.3*  --   --   HGB 15.9 15.6 15.1  HCT 44.8 46.0 42.0  MCV 87.7  --  89.6  PLT 230  --  161   Basic Metabolic Panel:  Recent Labs Lab 07/31/16 1433 08/01/16 0531  NA 139 137  K 3.8 4.6  CL 101 106  CO2  --  21*  GLUCOSE 174* 254*  BUN 11 13  CREATININE 1.00 1.13  CALCIUM  --  9.1   Liver Function Tests:  Recent Labs Lab 08/01/16 0531  AST 27  ALT 16*  ALKPHOS 85  BILITOT 1.4*  PROT 6.6  ALBUMIN 4.0   Coagulation Profile:  Recent Labs Lab 08/01/16 0531  INR 1.14   HbA1C:  Recent Labs  07/31/16 1649  HGBA1C 6.5*   CBG:  Recent Labs Lab 07/31/16 1720 07/31/16 2218 08/01/16 0802 08/01/16 1134  GLUCAP 197* 227* 230* 254*    Recent Results (from the past 240 hour(s))  MRSA PCR Screening     Status: None   Collection Time: 08/01/16  3:13 AM  Result Value Ref Range Status   MRSA by PCR NEGATIVE NEGATIVE Final    Comment:        The GeneXpert MRSA Assay (FDA approved for NASAL specimens only), is one component of a comprehensive MRSA colonization surveillance program. It is not intended to diagnose MRSA infection nor to guide or monitor treatment for MRSA infections.          Radiology Studies: X-ray Chest Pa And Lateral  Result Date: 08/01/2016 CLINICAL DATA:  Shortness of breath. EXAM: CHEST  2 VIEW COMPARISON:  07/31/2016 FINDINGS: Mediastinum and hilar structures normal. Heart size stable. No focal infiltrate. Low lung volumes. No pleural effusion or pneumothorax . Degenerative changes thoracic spine. IMPRESSION: Low lung volumes with mild basilar atelectasis . No acute infiltrate noted. Electronically Signed   By: Marcello Moores  Register   On: 08/01/2016 07:02   Dg Chest 2 View  Result Date: 07/31/2016 CLINICAL DATA:  Sore throat. EXAM: CHEST  2 VIEW COMPARISON:  None. FINDINGS: The heart size and mediastinal contours are within normal limits. Both lungs are clear. The visualized skeletal structures are unremarkable.  IMPRESSION: No active cardiopulmonary disease. Electronically Signed   By: Dorise Bullion III M.D   On: 07/31/2016 17:03   Ct Soft Tissue Neck W Contrast  Result Date: 07/31/2016 CLINICAL DATA:  Initial evaluation for anterior neck swelling with stridor. EXAM: CT NECK WITH CONTRAST TECHNIQUE: Multidetector CT imaging of the neck was performed using the standard protocol following the bolus administration of intravenous contrast. CONTRAST:  80mL ISOVUE-300 IOPAMIDOL (ISOVUE-300) INJECTION 61% COMPARISON:  None available. FINDINGS: Pharynx and larynx: Oral cavity within normal limits without loculated fluid collection or mass lesion. No acute abnormality about the dentition. Palatine tonsils symmetric and normal. Parapharyngeal fat preserved. Nasopharynx normal. Epiglottis normal. Vallecula clear. Retropharyngeal soft tissues within normal limits. Remainder of the hypopharynx and supraglottic larynx within normal limits. True cords fairly symmetric and within normal limits. Salivary glands: Salivary glands including the parotid and submandibular glands are normal. Thyroid: Thyroid is markedly enlarged and heterogeneous in appearance, suggesting thyroid goiter. Right lobe of thyroid is larger  than the left. Slight substernal extension on the left into the upper mediastinum. Mass effect on the subglottic trachea which is compressed and narrowed, measuring approximately 5 mm in transverse diameter. No significant inflammatory stranding about the thyroid itself to suggest acute thyroiditis. Lymph nodes: No pathologically enlarged lymph nodes identified within the neck. Vascular: Normal intravascular enhancement seen throughout the neck. Carotid artery's are splayed at around the enlarged thyroid. Limited intracranial: Unremarkable. Visualized orbits: Globes and orbital soft tissues within normal limits. Mastoids and visualized paranasal sinuses: Mild mucosal thickening within the right maxillary sinus. Visualized  paranasal sinuses are otherwise clear. Mastoids and middle ear cavities are well pneumatized and clear. Skeleton: No acute osseous abnormality. No worrisome lytic or blastic osseous lesions. Moderate degenerative spondylolysis present at C5-6 and C6-7. Upper chest: Visualized upper chest within normal limits. Partially visualized lungs are clear. Other: Incidental note made of a 17 mm hypodense collection within subcutaneous fat of the right posterior neck, likely a sebaceous cyst. No significant associated inflammatory changes. IMPRESSION: 1. Markedly enlarged heterogeneous thyroid, likely related to thyroid goiter. Secondary mass effect on the subglottic trachea which is narrowed to 5 mm in transverse diameter. 2. No other acute abnormality identified within the neck. Results were discussed by telephone at the time of interpretation on 07/31/2016 at approximately 3:45 p.m. with Dr. Zenovia Jarred. Electronically Signed   By: Jeannine Boga M.D.   On: 07/31/2016 16:02        Scheduled Meds: . amLODipine  5 mg Oral Daily  . aspirin EC  81 mg Oral Daily  . cloNIDine  0.1 mg Oral BID  . dexamethasone  4 mg Intravenous Q12H  . enoxaparin (LOVENOX) injection  40 mg Subcutaneous QHS  . insulin aspart  0-9 Units Subcutaneous TID WC   Continuous Infusions: . sodium chloride 75 mL/hr at 08/01/16 0141  . piperacillin-tazobactam (ZOSYN)  IV Stopped (08/01/16 1034)  . vancomycin Stopped (08/01/16 0973)     LOS: 1 day     Neriyah Cercone, MD, FACP, FHM. [facility] Pager [phone]  If 7PM-7AM, please contact night-coverage [email or web address] Password [email or web address] 08/01/2016, 2:35 PM

## 2016-08-01 NOTE — Progress Notes (Signed)
   08/01/16 1120  Clinical Encounter Type  Visited With Patient  Visit Type Other (Comment) ([place] consult)  Spiritual Encounters  Spiritual Needs Emotional  Stress Factors  Patient Stress Factors Health changes  Introduction to Pt. AD paperwork in room and Pt aware of procedures. Stated he will discuss with wife later. Offered prayer of comfort and hope. Follow up later.

## 2016-08-02 DIAGNOSIS — E049 Nontoxic goiter, unspecified: Principal | ICD-10-CM

## 2016-08-02 DIAGNOSIS — E1165 Type 2 diabetes mellitus with hyperglycemia: Secondary | ICD-10-CM

## 2016-08-02 DIAGNOSIS — I10 Essential (primary) hypertension: Secondary | ICD-10-CM

## 2016-08-02 DIAGNOSIS — E782 Mixed hyperlipidemia: Secondary | ICD-10-CM

## 2016-08-02 LAB — CBC
HEMATOCRIT: 40.7 % (ref 39.0–52.0)
HEMOGLOBIN: 13.8 g/dL (ref 13.0–17.0)
MCH: 30.4 pg (ref 26.0–34.0)
MCHC: 33.9 g/dL (ref 30.0–36.0)
MCV: 89.6 fL (ref 78.0–100.0)
Platelets: 280 10*3/uL (ref 150–400)
RBC: 4.54 MIL/uL (ref 4.22–5.81)
RDW: 13 % (ref 11.5–15.5)
WBC: 19.5 10*3/uL — AB (ref 4.0–10.5)

## 2016-08-02 LAB — GLUCOSE, CAPILLARY
GLUCOSE-CAPILLARY: 178 mg/dL — AB (ref 65–99)
GLUCOSE-CAPILLARY: 207 mg/dL — AB (ref 65–99)
GLUCOSE-CAPILLARY: 263 mg/dL — AB (ref 65–99)
Glucose-Capillary: 249 mg/dL — ABNORMAL HIGH (ref 65–99)

## 2016-08-02 LAB — SURGICAL PCR SCREEN
MRSA, PCR: NEGATIVE
Staphylococcus aureus: NEGATIVE

## 2016-08-02 MED ORDER — ALPRAZOLAM 0.25 MG PO TABS
0.2500 mg | ORAL_TABLET | Freq: Three times a day (TID) | ORAL | Status: DC | PRN
Start: 2016-08-02 — End: 2016-08-05
  Administered 2016-08-02 – 2016-08-03 (×3): 0.25 mg via ORAL
  Filled 2016-08-02 (×2): qty 1

## 2016-08-02 MED ORDER — DEXAMETHASONE 2 MG PO TABS
2.0000 mg | ORAL_TABLET | Freq: Two times a day (BID) | ORAL | Status: DC
  Administered 2016-08-02: 2 mg via ORAL
  Filled 2016-08-02 (×2): qty 1

## 2016-08-02 NOTE — Progress Notes (Signed)
Upon rounding I noticed that Mr. Timothy Page had been crying and trying to hold it back when I was in the room. When asked, patient states, " do you think he could get me something for anxiety?" Pt became very tearful when talking about his upcoming surgery to this RN and states his fears for his little son [name] (2 yrs) that he just got custody of. Pt states, " He needs a father figure real bad. He is my shadow." MD paged and made aware. Will continue to offer emotional support and listening ear.   Lucius Conn, RN

## 2016-08-02 NOTE — Progress Notes (Signed)
Wife Precious Bard called and would like Dr. Constance Holster to give her a call with an update and to talk about the surgery. Please call her cell phone at [phone]. Thank you  Lucius Conn, RN

## 2016-08-02 NOTE — Care Management Note (Signed)
Case Management Note Marvetta Gibbons RN, BSN Unit 2W-Case Manager-- [place] coverage [phone]  Patient Details  Name: Timothy Page MRN: [id number] Date of Birth: 1955-05-24  Subjective/Objective:  Pt admitted with obstructing goiter- CT confirmed large goiter with mass effect. ENT consulted and plan surgery on 08/03/16.                 Action/Plan: PTA pt lived at home- independent- CM to follow for d/c needs  Expected Discharge Date:            Expected Discharge Plan:  Home/Self Care  In-House Referral:     Discharge planning Services  CM Consult  Post Acute Care Choice:    Choice offered to:     DME Arranged:    DME Agency:     HH Arranged:    [place] Agency:     Status of Service:  In process, will continue to follow  If discussed at Long Length of Stay Meetings, dates discussed:    Discharge Disposition:   Additional Comments:  Dawayne Patricia, RN 08/02/2016, 10:19 AM

## 2016-08-02 NOTE — Progress Notes (Signed)
PROGRESS NOTE   Timothy Page  LPF:[id number]    DOB: 09-23-1955    DOA: 07/31/2016  PCP: Hendricks Limes, MD   I have briefly reviewed patients previous medical records in [place].  Brief Narrative:  61 year old male with PMH of HTN, DM 2, prior remote MI, asthma, presented to ED with progressively enlarging neck mass and difficulty breathing. He reports thyroid enlargement identified by his PCP a year ago who has since retired (however I'm unable to find note in [facility] regarding thyroid enlargement). He does not follow with her PCP since. Since 3 months, he has noted gradual increase in size of the swelling which progressively got worse a couple days ago especially lying flat, had some cough after eating, difficulty swallowing large bites of food. He denies neck pain, fever, chills, extreme heat or cold, visual disturbances, weight loss. CT confirmed large goiter with mass effect. ENT consulted and plan surgery on 08/03/16.   Assessment & Plan:   Active Problems:   Lipoma   Essential hypertension   Mixed hyperlipidemia   Type 2 diabetes mellitus (HCC)   Hx of adenomatous polyp of colon   Goiter  Large goiter with obstructing symptoms: - Clinically euthyroid. TSH normal/0.506. ENT consultation appreciated and suspect that this is likely benign with a small chance of malignancy. Recommend total thyroidectomy , discussed with Dr. Constance Holster , plan for surgery 08/03/16, meanwhile continue with IV steroids , may need vent support 24 hours postoperatively . and plan for 08/03/16. Discussed with Dr. Constance Holster, recommends continued steroids but no indication for IV   Essential hypertension:  - Better controlled after increasing amlodipine, continue with clonidine 0.1 mg twice a day, continue with when necessary hydralazine , anxiety most likely contributing to elevated blood pressure, started on low dose when necessary Xanax .  Uncontrolled DM 2:  - A1c 6.5 suggests good outpatient control.  Uncontrolled here due to steroids. Better controlled after starting Lantus 5 units daily, continue with insulin sliding scale   Hyperlipidemia:  - Not on medications.  History of MI:  - Denies chest pain. Continue with aspirin  Leukocytosis: - Worsening today, most likely related to steroid, nontoxic appearing, negative urinalysis, chest x-ray without infiltrate, continue to monitor off antibiotics   DVT prophylaxis:  Lovenox Code Status:  full Family Communication:  none at bedside Disposition:  DC home when medically improved.   Consultants:   ENT/Dr. Constance Holster   Procedures:   None  Antimicrobials:   IV vancomycin and Zosyn-discontinued 6/18    Subjective:  Denies any complaints this a.m., no dyspnea, no cough, no chest pain, no nausea or vomiting or swallowing problems asking if he can shower  Objective:  Vitals:   08/02/16 0800 08/02/16 0900 08/02/16 0923 08/02/16 1000  BP: (!) 150/88 (!) 162/105 (!) 162/105 (!) 156/82  Pulse: 66 85  90  Resp: 19 18  19   Temp:      TempSrc:      SpO2: 95% 92%  95%  Weight:      Height:        Examination:  AA3, no apparent distress, mildly anxious Neck with diffuse goiter right> left, no tenderness, no erythema Good air entry bilaterally, no wheezing or rales rhonchi, clear to auscultation S1 and S2 heard, no rubs murmurs or gallops, regular rate and rhythm Abdomen soft, nondistended, bowel sounds present Extremities with no edema, clubbing or cyanosis Judgement and insight appear normal.    Data Reviewed: I have personally reviewed following  labs and imaging studies  CBC:  Recent Labs Lab 07/31/16 1422 07/31/16 1433 08/01/16 0531 08/02/16 0210  WBC 17.1*  --  16.6* 19.5*  NEUTROABS 15.3*  --   --   --   HGB 15.9 15.6 15.1 13.8  HCT 44.8 46.0 42.0 40.7  MCV 87.7  --  89.6 89.6  PLT 230  --  221 734   Basic Metabolic Panel:  Recent Labs Lab 07/31/16 1433 08/01/16 0531  NA 139 137  K 3.8 4.6  CL 101 106    CO2  --  21*  GLUCOSE 174* 254*  BUN 11 13  CREATININE 1.00 1.13  CALCIUM  --  9.1   Liver Function Tests:  Recent Labs Lab 08/01/16 0531  AST 27  ALT 16*  ALKPHOS 85  BILITOT 1.4*  PROT 6.6  ALBUMIN 4.0   Coagulation Profile:  Recent Labs Lab 08/01/16 0531  INR 1.14   HbA1C:  Recent Labs  07/31/16 1649  HGBA1C 6.5*   CBG:  Recent Labs Lab 08/01/16 0802 08/01/16 1134 08/01/16 1537 08/01/16 2151 08/02/16 0749  GLUCAP 230* 254* 304* 152* 178*    Recent Results (from the past 240 hour(s))  Culture, blood (Routine X 2) w Reflex to ID Panel     Status: None (Preliminary result)   Collection Time: 07/31/16  8:01 PM  Result Value Ref Range Status   Specimen Description BLOOD RIGHT HAND  Final   Special Requests   Final    BOTTLES DRAWN AEROBIC AND ANAEROBIC Blood Culture adequate volume   Culture NO GROWTH < 24 HOURS  Final   Report Status PENDING  Incomplete  Culture, blood (Routine X 2) w Reflex to ID Panel     Status: None (Preliminary result)   Collection Time: 07/31/16  8:08 PM  Result Value Ref Range Status   Specimen Description BLOOD LEFT HAND  Final   Special Requests   Final    BOTTLES DRAWN AEROBIC AND ANAEROBIC Blood Culture adequate volume   Culture NO GROWTH < 24 HOURS  Final   Report Status PENDING  Incomplete  MRSA PCR Screening     Status: None   Collection Time: 08/01/16  3:13 AM  Result Value Ref Range Status   MRSA by PCR NEGATIVE NEGATIVE Final    Comment:        The GeneXpert MRSA Assay (FDA approved for NASAL specimens only), is one component of a comprehensive MRSA colonization surveillance program. It is not intended to diagnose MRSA infection nor to guide or monitor treatment for MRSA infections.          Radiology Studies: X-ray Chest Pa And Lateral  Result Date: 08/01/2016 CLINICAL DATA:  Shortness of breath. EXAM: CHEST  2 VIEW COMPARISON:  07/31/2016 FINDINGS: Mediastinum and hilar structures normal. Heart  size stable. No focal infiltrate. Low lung volumes. No pleural effusion or pneumothorax . Degenerative changes thoracic spine. IMPRESSION: Low lung volumes with mild basilar atelectasis . No acute infiltrate noted. Electronically Signed   By: Marcello Moores  Register   On: 08/01/2016 07:02   Dg Chest 2 View  Result Date: 07/31/2016 CLINICAL DATA:  Sore throat. EXAM: CHEST  2 VIEW COMPARISON:  None. FINDINGS: The heart size and mediastinal contours are within normal limits. Both lungs are clear. The visualized skeletal structures are unremarkable. IMPRESSION: No active cardiopulmonary disease. Electronically Signed   By: Dorise Bullion III M.D   On: 07/31/2016 17:03   Ct Soft Tissue Neck W Contrast  Result Date: 07/31/2016 CLINICAL DATA:  Initial evaluation for anterior neck swelling with stridor. EXAM: CT NECK WITH CONTRAST TECHNIQUE: Multidetector CT imaging of the neck was performed using the standard protocol following the bolus administration of intravenous contrast. CONTRAST:  35mL ISOVUE-300 IOPAMIDOL (ISOVUE-300) INJECTION 61% COMPARISON:  None available. FINDINGS: Pharynx and larynx: Oral cavity within normal limits without loculated fluid collection or mass lesion. No acute abnormality about the dentition. Palatine tonsils symmetric and normal. Parapharyngeal fat preserved. Nasopharynx normal. Epiglottis normal. Vallecula clear. Retropharyngeal soft tissues within normal limits. Remainder of the hypopharynx and supraglottic larynx within normal limits. True cords fairly symmetric and within normal limits. Salivary glands: Salivary glands including the parotid and submandibular glands are normal. Thyroid: Thyroid is markedly enlarged and heterogeneous in appearance, suggesting thyroid goiter. Right lobe of thyroid is larger than the left. Slight substernal extension on the left into the upper mediastinum. Mass effect on the subglottic trachea which is compressed and narrowed, measuring approximately 5 mm in  transverse diameter. No significant inflammatory stranding about the thyroid itself to suggest acute thyroiditis. Lymph nodes: No pathologically enlarged lymph nodes identified within the neck. Vascular: Normal intravascular enhancement seen throughout the neck. Carotid artery's are splayed at around the enlarged thyroid. Limited intracranial: Unremarkable. Visualized orbits: Globes and orbital soft tissues within normal limits. Mastoids and visualized paranasal sinuses: Mild mucosal thickening within the right maxillary sinus. Visualized paranasal sinuses are otherwise clear. Mastoids and middle ear cavities are well pneumatized and clear. Skeleton: No acute osseous abnormality. No worrisome lytic or blastic osseous lesions. Moderate degenerative spondylolysis present at C5-6 and C6-7. Upper chest: Visualized upper chest within normal limits. Partially visualized lungs are clear. Other: Incidental note made of a 17 mm hypodense collection within subcutaneous fat of the right posterior neck, likely a sebaceous cyst. No significant associated inflammatory changes. IMPRESSION: 1. Markedly enlarged heterogeneous thyroid, likely related to thyroid goiter. Secondary mass effect on the subglottic trachea which is narrowed to 5 mm in transverse diameter. 2. No other acute abnormality identified within the neck. Results were discussed by telephone at the time of interpretation on 07/31/2016 at approximately 3:45 p.m. with Dr. Zenovia Jarred. Electronically Signed   By: Jeannine Boga M.D.   On: 07/31/2016 16:02        Scheduled Meds: . amLODipine  10 mg Oral Daily  . aspirin EC  81 mg Oral Daily  . cloNIDine  0.1 mg Oral BID  . enoxaparin (LOVENOX) injection  40 mg Subcutaneous QHS  . insulin aspart  0-5 Units Subcutaneous QHS  . insulin aspart  0-9 Units Subcutaneous TID WC  . insulin glargine  5 Units Subcutaneous Daily   Continuous Infusions:    LOS: 2 days     Phillips Climes, MD [facility] Pager [phone]  If 7PM-7AM, please contact night-coverage [email or web address] Password [facility] 08/02/2016, 10:56 AM

## 2016-08-03 ENCOUNTER — Encounter (HOSPITAL_COMMUNITY)
Admission: EM | Disposition: A | Discharge status: Home / Self Care | Payer: Self-pay | Source: Home / Self Care | Attending: Internal Medicine

## 2016-08-03 ENCOUNTER — Inpatient Hospital Stay (HOSPITAL_COMMUNITY): Payer: 59 | Admitting: Anesthesiology

## 2016-08-03 ENCOUNTER — Encounter (HOSPITAL_COMMUNITY): Payer: Self-pay | Admitting: Anesthesiology

## 2016-08-03 HISTORY — PX: THYROIDECTOMY: SHX17

## 2016-08-03 LAB — BASIC METABOLIC PANEL
ANION GAP: 9 (ref 5–15)
BUN: 21 mg/dL — AB (ref 6–20)
CALCIUM: 8.7 mg/dL — AB (ref 8.9–10.3)
CO2: 24 mmol/L (ref 22–32)
Chloride: 106 mmol/L (ref 101–111)
Creatinine, Ser: 1.04 mg/dL (ref 0.61–1.24)
GFR calc Af Amer: >60 mL/min (ref >60)
GLUCOSE: 165 mg/dL — AB (ref 65–99)
Potassium: 4 mmol/L (ref 3.5–5.1)
SODIUM: 139 mmol/L (ref 135–145)

## 2016-08-03 LAB — CBC
HCT: 39 % (ref 39.0–52.0)
Hemoglobin: 13.3 g/dL (ref 13.0–17.0)
MCH: 30.4 pg (ref 26.0–34.0)
MCHC: 34.1 g/dL (ref 30.0–36.0)
MCV: 89.2 fL (ref 78.0–100.0)
PLATELETS: 289 10*3/uL (ref 150–400)
RBC: 4.37 MIL/uL (ref 4.22–5.81)
RDW: 12.9 % (ref 11.5–15.5)
WBC: 14.6 10*3/uL — AB (ref 4.0–10.5)

## 2016-08-03 LAB — GLUCOSE, CAPILLARY
Glucose-Capillary: 157 mg/dL — ABNORMAL HIGH (ref 65–99)
Glucose-Capillary: 120 mg/dL — ABNORMAL HIGH (ref 65–99)
Glucose-Capillary: 223 mg/dL — ABNORMAL HIGH (ref 65–99)
Glucose-Capillary: 195 mg/dL — ABNORMAL HIGH (ref 65–99)

## 2016-08-03 LAB — CALCIUM
CALCIUM: 8.7 mg/dL — AB (ref 8.9–10.3)
CALCIUM: 8.4 mg/dL — AB (ref 8.9–10.3)

## 2016-08-03 SURGERY — THYROIDECTOMY
Anesthesia: General | Site: Neck

## 2016-08-03 MED ORDER — OXYCODONE HCL 5 MG PO TABS
5.0000 mg | ORAL_TABLET | Freq: Once | ORAL | Status: AC | PRN
  Administered 2016-08-03: 5 mg via ORAL

## 2016-08-03 MED ORDER — PROPOFOL 10 MG/ML IV BOLUS
INTRAVENOUS | Status: AC
  Filled 2016-08-03: qty 20

## 2016-08-03 MED ORDER — 0.9 % SODIUM CHLORIDE (POUR BTL) OPTIME
TOPICAL | Status: DC | PRN
  Administered 2016-08-03: 1000 mL

## 2016-08-03 MED ORDER — OXYCODONE HCL 5 MG/5ML PO SOLN: 5.0000 mg | Freq: Once | ORAL | Status: AC | PRN

## 2016-08-03 MED ORDER — PROPOFOL 10 MG/ML IV BOLUS
INTRAVENOUS | Status: DC | PRN
  Administered 2016-08-03: 150 mg via INTRAVENOUS

## 2016-08-03 MED ORDER — ROCURONIUM BROMIDE 100 MG/10ML IV SOLN
INTRAVENOUS | Status: DC | PRN
  Administered 2016-08-03: 50 mg via INTRAVENOUS

## 2016-08-03 MED ORDER — ONDANSETRON HCL 4 MG/2ML IJ SOLN
INTRAMUSCULAR | Status: DC | PRN
  Administered 2016-08-03: 4 mg via INTRAVENOUS

## 2016-08-03 MED ORDER — PROMETHAZINE HCL 25 MG/ML IJ SOLN: 6.2500 mg | INTRAMUSCULAR | Status: DC | PRN

## 2016-08-03 MED ORDER — HYDRALAZINE HCL 20 MG/ML IJ SOLN: 5.0000 mg | INTRAMUSCULAR | Status: DC | PRN

## 2016-08-03 MED ORDER — LIDOCAINE HCL (CARDIAC) 20 MG/ML IV SOLN
INTRAVENOUS | Status: DC | PRN
  Administered 2016-08-03: 100 mg via INTRAVENOUS

## 2016-08-03 MED ORDER — SUGAMMADEX SODIUM 200 MG/2ML IV SOLN
INTRAVENOUS | Status: DC | PRN
  Administered 2016-08-03: 200 mg via INTRAVENOUS

## 2016-08-03 MED ORDER — LACTATED RINGERS IV SOLN
INTRAVENOUS | Status: DC | PRN
  Administered 2016-08-03: 08:00:00 via INTRAVENOUS

## 2016-08-03 MED ORDER — SUGAMMADEX SODIUM 200 MG/2ML IV SOLN
INTRAVENOUS | Status: AC
  Filled 2016-08-03: qty 2

## 2016-08-03 MED ORDER — MIDAZOLAM HCL 2 MG/2ML IJ SOLN
INTRAMUSCULAR | Status: AC
  Filled 2016-08-03: qty 2

## 2016-08-03 MED ORDER — HYDROMORPHONE HCL 1 MG/ML IJ SOLN: 0.2500 mg | INTRAMUSCULAR | Status: DC | PRN

## 2016-08-03 MED ORDER — CLONIDINE HCL 0.2 MG PO TABS
0.2000 mg | ORAL_TABLET | Freq: Two times a day (BID) | ORAL | Status: DC
  Administered 2016-08-03 – 2016-08-05 (×4): 0.2 mg via ORAL
  Filled 2016-08-03 (×2): qty 1
  Filled 2016-08-03: qty 2
  Filled 2016-08-03 (×2): qty 1

## 2016-08-03 MED ORDER — PHENYLEPHRINE HCL 10 MG/ML IJ SOLN
INTRAVENOUS | Status: DC | PRN
  Administered 2016-08-03: 10 ug/min via INTRAVENOUS

## 2016-08-03 MED ORDER — ALPRAZOLAM 0.25 MG PO TABS
ORAL_TABLET | ORAL | Status: AC
  Filled 2016-08-03: qty 1

## 2016-08-03 MED ORDER — ONDANSETRON HCL 4 MG/2ML IJ SOLN
INTRAMUSCULAR | Status: AC
  Filled 2016-08-03: qty 2

## 2016-08-03 MED ORDER — ROCURONIUM BROMIDE 10 MG/ML (PF) SYRINGE
PREFILLED_SYRINGE | INTRAVENOUS | Status: AC
  Filled 2016-08-03: qty 5

## 2016-08-03 MED ORDER — LABETALOL HCL 5 MG/ML IV SOLN
5.0000 mg | Freq: Once | INTRAVENOUS | Status: AC
  Administered 2016-08-03: 5 mg via INTRAVENOUS

## 2016-08-03 MED ORDER — PROMETHAZINE HCL 25 MG RE SUPP: 25.0000 mg | Freq: Four times a day (QID) | RECTAL | 1 refills | Status: DC | PRN

## 2016-08-03 MED ORDER — LIDOCAINE 2% (20 MG/ML) 5 ML SYRINGE
INTRAMUSCULAR | Status: AC
  Filled 2016-08-03: qty 5

## 2016-08-03 MED ORDER — FENTANYL CITRATE (PF) 100 MCG/2ML IJ SOLN
INTRAMUSCULAR | Status: DC | PRN
  Administered 2016-08-03 (×2): 50 ug via INTRAVENOUS
  Administered 2016-08-03: 100 ug via INTRAVENOUS
  Administered 2016-08-03: 50 ug via INTRAVENOUS

## 2016-08-03 MED ORDER — DEXTROSE-NACL 5-0.2 % IV SOLN
INTRAVENOUS | Status: DC
  Administered 2016-08-03 – 2016-08-04 (×3): via INTRAVENOUS

## 2016-08-03 MED ORDER — HYDROCODONE-ACETAMINOPHEN 7.5-325 MG PO TABS: 1.0000 | ORAL_TABLET | Freq: Four times a day (QID) | ORAL | 0 refills | Status: DC | PRN

## 2016-08-03 MED ORDER — MENTHOL 3 MG MT LOZG
1.0000 | LOZENGE | OROMUCOSAL | Status: DC | PRN
  Administered 2016-08-03: 3 mg via ORAL
  Filled 2016-08-03: qty 9

## 2016-08-03 MED ORDER — FENTANYL CITRATE (PF) 250 MCG/5ML IJ SOLN
INTRAMUSCULAR | Status: AC
  Filled 2016-08-03: qty 5

## 2016-08-03 MED ORDER — LEVOTHYROXINE SODIUM 100 MCG PO TABS
100.0000 ug | ORAL_TABLET | Freq: Every day | ORAL | Status: DC
  Administered 2016-08-04: 100 ug via ORAL
  Filled 2016-08-03: qty 1

## 2016-08-03 MED ORDER — SUCCINYLCHOLINE CHLORIDE 20 MG/ML IJ SOLN
INTRAMUSCULAR | Status: DC | PRN
  Administered 2016-08-03: 120 mg via INTRAVENOUS

## 2016-08-03 MED ORDER — MEPERIDINE HCL 25 MG/ML IJ SOLN: 6.2500 mg | INTRAMUSCULAR | Status: DC | PRN

## 2016-08-03 MED ORDER — LABETALOL HCL 5 MG/ML IV SOLN
INTRAVENOUS | Status: AC
  Administered 2016-08-03: 5 mg
  Filled 2016-08-03: qty 4

## 2016-08-03 MED ORDER — OXYCODONE HCL 5 MG PO TABS
ORAL_TABLET | ORAL | Status: AC
  Filled 2016-08-03: qty 1

## 2016-08-03 MED ORDER — LEVOTHYROXINE SODIUM 100 MCG PO TABS: 100.0000 ug | ORAL_TABLET | Freq: Every day | ORAL | 6 refills | Status: DC

## 2016-08-03 MED ORDER — MIDAZOLAM HCL 5 MG/5ML IJ SOLN
INTRAMUSCULAR | Status: DC | PRN
  Administered 2016-08-03: 2 mg via INTRAVENOUS

## 2016-08-03 MED ORDER — PHENOL 1.4 % MT LIQD
1.0000 | OROMUCOSAL | Status: DC | PRN
  Administered 2016-08-03: 1 via OROMUCOSAL
  Filled 2016-08-03: qty 177

## 2016-08-03 SURGICAL SUPPLY — 39 items
ADH SKN CLS APL DERMABOND .7 (GAUZE/BANDAGES/DRESSINGS) ×1
CANISTER SUCT 3000ML PPV (MISCELLANEOUS) ×2 IMPLANT
CLEANER TIP ELECTROSURG 2X2 (MISCELLANEOUS) ×2 IMPLANT
CONT SPEC 4OZ CLIKSEAL STRL BL (MISCELLANEOUS) ×2 IMPLANT
CORDS BIPOLAR (ELECTRODE) ×2 IMPLANT
COVER SURGICAL LIGHT HANDLE (MISCELLANEOUS) ×2 IMPLANT
DERMABOND ADVANCED (GAUZE/BANDAGES/DRESSINGS) ×1
DERMABOND ADVANCED .7 DNX12 (GAUZE/BANDAGES/DRESSINGS) ×1 IMPLANT
DRAIN HEMOVAC 7FR (DRAIN) IMPLANT
DRAIN JACKSON PRT FLT 10 (DRAIN) ×1 IMPLANT
DRAPE HALF SHEET 40X57 (DRAPES) ×1 IMPLANT
ELECT COATED BLADE 2.86 ST (ELECTRODE) ×2 IMPLANT
ELECT REM PT RETURN 9FT ADLT (ELECTROSURGICAL) ×2
ELECTRODE REM PT RTRN 9FT ADLT (ELECTROSURGICAL) ×1 IMPLANT
EVACUATOR SILICONE 100CC (DRAIN) ×2 IMPLANT
FORCEPS BIPOLAR SPETZLER 8 1.0 (NEUROSURGERY SUPPLIES) ×2 IMPLANT
GAUZE SPONGE 4X4 16PLY XRAY LF (GAUZE/BANDAGES/DRESSINGS) ×3 IMPLANT
GLOVE BIO SURGEON STRL SZ7.5 (GLOVE) ×1 IMPLANT
GLOVE BIOGEL PI IND STRL 7.5 (GLOVE) IMPLANT
GLOVE BIOGEL PI INDICATOR 7.5 (GLOVE) ×1
GLOVE ECLIPSE 7.5 STRL STRAW (GLOVE) ×2 IMPLANT
GOWN STRL REUS W/ TWL LRG LVL3 (GOWN DISPOSABLE) ×2 IMPLANT
GOWN STRL REUS W/TWL LRG LVL3 (GOWN DISPOSABLE) ×6
KIT BASIN OR (CUSTOM PROCEDURE TRAY) ×2 IMPLANT
KIT ROOM TURNOVER OR (KITS) ×2 IMPLANT
NDL PRECISIONGLIDE 27X1.5 (NEEDLE) ×1 IMPLANT
NEEDLE PRECISIONGLIDE 27X1.5 (NEEDLE) ×2 IMPLANT
NS IRRIG 1000ML POUR BTL (IV SOLUTION) ×2 IMPLANT
PAD ARMBOARD 7.5X6 YLW CONV (MISCELLANEOUS) ×4 IMPLANT
PENCIL FOOT CONTROL (ELECTRODE) ×2 IMPLANT
SHEARS HARMONIC 9CM CVD (BLADE) ×2 IMPLANT
STAPLER VISISTAT 35W (STAPLE) ×2 IMPLANT
SUT CHROMIC 3 0 PS 2 (SUTURE) ×3 IMPLANT
SUT ETHILON 3 0 PS 1 (SUTURE) ×2 IMPLANT
SUT SILK 2 0 SH (SUTURE) ×1 IMPLANT
SUT SILK 3 0 REEL (SUTURE) IMPLANT
SUT SILK 4 0 REEL (SUTURE) ×2 IMPLANT
TOWEL OR 17X24 6PK STRL BLUE (TOWEL DISPOSABLE) ×2 IMPLANT
TRAY ENT MC OR (CUSTOM PROCEDURE TRAY) ×2 IMPLANT

## 2016-08-03 NOTE — Op Note (Signed)
OPERATIVE REPORT  DATE OF SURGERY: 08/03/2016  PATIENT:  Timothy Page,  61 y.o. male  PRE-OPERATIVE DIAGNOSIS:  Large thyroid goiter  POST-OPERATIVE DIAGNOSIS:  Large thyroid goiter  PROCEDURE:  Procedure(s): THYROIDECTOMY  SURGEON:  Beckie Salts, MD  ASSISTANTS: Sharyn Dross, RNFA  ANESTHESIA:   General   EBL:  100 ml  DRAINS: 10 French round J-P  LOCAL MEDICATIONS USED:  None  SPECIMEN:  Total thyroidectomy, right lobe marked with a suture  COUNTS:  Correct  PROCEDURE DETAILS: The patient was taken to the operating room and placed on the operating table in the supine position. A shoulder roll was placed beneath the shoulder blades and the neck was extended. The neck was prepped and draped in a standard fashion. A 10 cm low collar transverse incision was outlined with a marking pen and was incised with electrocautery . Dissection was continued down through the platysma layer. Subplatysmal flaps were elevated superiorly and inferiorly Self-retaining retractors were used throughout the case.  The midline fascia was divided. And the strap muscles were dissected off of the thyroid lobe on both sides. The right side was dissected initially. The right side was very large, or dissection was used to expose the entire right lobe and to help deliver it from the wound to expose the attachment along the trachea. Careful dissection was used to expose and ligate the superior vasculature. A parathyroid was identified and preserved with its blood supply. The recurrent nerve was not identified on the right. There may have been a small remnant of thyroid tissue overlying the nerve on the right. Middle thyroid vein and inferior vasculature were also ligated right up against the capsule of the gland. The right lobe was dissected off Berry's ligament and brought forward. The left lobe was then dissected in a similar fashion although the left side was smaller. The right side measures approximately 8-10  cm and the left side 6-8 cm. The superior vasculature was ligated between clamps and divided. The middle thyroid vein was ligated and divided. Inferior vasculature was equally treated. The recurrent nerve was identified and preserved. A suspected superior parathyroid was also identified and preserved with its blood supply. Berry's ligament was identified and dissected exposing the upper trachea. The isthmus was kept intact and dissected off of the trachea. The entire gland was delivered and sent for pathologic evaluation. Stasis was completed using bipolar cautery and additional 4-0 silk ties as needed. The harmonic dissector was also used in various parts of the case when applicable. The wound was irrigated with saline. The drain was exited through a separate stab incision inferiorly at the midline. This was secured in place with nylon suture. 4-0 chromic was used to reapproximate the midline fascia and the platysma layer. Subcuticular running closure was accomplished with chromic as well. Dermabond was used on the skin. Patient was awakened extubated and transferred to recovery in stable condition. There was no stridor On extubation.  The drain was secured in place with a nylon suture. The midline fascia was reapproximated with interrupted chromic suture. The platysma layer was also reapproximated with interrupted chromic suture. A running subcuticular closure was accomplished. Dermabond was used on the skin. The drain was charged. The patient was awakened, extubated and transferred to recovery in stable condition.   PATIENT DISPOSITION:  To PACU, stable

## 2016-08-03 NOTE — H&P (View-Only) (Signed)
Reason for Consult:Thyroid mass Referring Physician: Modena Jansky, MD  Timothy Page is an 61 y.o. male.  HPI: He was admitted last night due to difficulty breathing.He had a thyroid enlargement identified about a year ago by his primary care physician who is since retired. He didn't have any follow-up after that. He no longer had a primary care doctor. About 3 months ago he started to notice some difficulty with his breathing and couple of days ago it got much worse to the point where he was unable to lie flat on his back. He would wake him up at night due to difficulty breathing. He is having some trouble Swallowing as well.  Past Medical History:  Diagnosis Date  . Arthritis   . Heart attack [facility]) 1980   age 50 X 2 over 47 month period  . Hx of adenomatous polyp of colon 07/02/2009  . Lipoma    back  . Seasonal allergies     Past Surgical History:  Procedure Laterality Date  . CARDIAC CATHETERIZATION  1980  . finger amputaion     with reattachement  . LASIK      Family History  Problem Relation Age of Onset  . Diabetes Mother   . Cancer Mother        lung  . COPD Mother   . Brain cancer Mother   . Diabetes Father   . Cancer Father        mesothelioma; skin  . Mental illness Father   . Heart disease Maternal Grandmother        MI @ 24  . Heart disease Maternal Grandfather        MI @ 8  . Cancer Paternal Grandmother   . Heart disease Paternal Grandfather        MI @ 56  . Thyroid cancer Maternal Aunt     Social History:  reports that he has never smoked. His smokeless tobacco use includes Snuff. He reports that he does not drink alcohol or use drugs.  Allergies:  Allergies  Allergen Reactions  . Tenormin [Atenolol]     Severe "slowing"of functioning  . Tetracycline     Rash Because of a history of documented adverse serious drug reaction;Medi Alert bracelet  is recommended  . Lipitor [Atorvastatin]     D/Ced by him due to Myalgias in Sept 2014     Medications: Reviewed  Results for orders placed or performed during the hospital encounter of 07/31/16 (from the past 48 hour(s))  CBC with Differential/Platelet     Status: Abnormal   Collection Time: 07/31/16  2:22 PM  Result Value Ref Range   WBC 17.1 (H) 4.0 - 10.5 K/uL   RBC 5.11 4.22 - 5.81 MIL/uL   Hemoglobin 15.9 13.0 - 17.0 g/dL   HCT 44.8 39.0 - 52.0 %   MCV 87.7 78.0 - 100.0 fL   MCH 31.1 26.0 - 34.0 pg   MCHC 35.5 30.0 - 36.0 g/dL   RDW 12.5 11.5 - 15.5 %   Platelets 230 150 - 400 K/uL   Neutrophils Relative % 90 %   Neutro Abs 15.3 (H) 1.7 - 7.7 K/uL   Lymphocytes Relative 6 %   Lymphs Abs 1.1 0.7 - 4.0 K/uL   Monocytes Relative 4 %   Monocytes Absolute 0.7 0.1 - 1.0 K/uL   Eosinophils Relative 0 %   Eosinophils Absolute 0.0 0.0 - 0.7 K/uL   Basophils Relative 0 %   Basophils Absolute 0.0  0.0 - 0.1 K/uL  I-Stat CG4 Lactic Acid, ED     Status: None   Collection Time: 07/31/16  2:33 PM  Result Value Ref Range   Lactic Acid, Venous 1.83 0.5 - 1.9 mmol/L  I-stat chem 8, ed     Status: Abnormal   Collection Time: 07/31/16  2:33 PM  Result Value Ref Range   Sodium 139 135 - 145 mmol/L   Potassium 3.8 3.5 - 5.1 mmol/L   Chloride 101 101 - 111 mmol/L   BUN 11 6 - 20 mg/dL   Creatinine, Ser 1.00 0.61 - 1.24 mg/dL   Glucose, Bld 174 (H) 65 - 99 mg/dL   Calcium, Ion 1.17 1.15 - 1.40 mmol/L   TCO2 26 0 - 100 mmol/L   Hemoglobin 15.6 13.0 - 17.0 g/dL   HCT 46.0 39.0 - 52.0 %  TSH     Status: None   Collection Time: 07/31/16  3:35 PM  Result Value Ref Range   TSH 0.506 0.350 - 4.500 uIU/mL    Comment: Performed by a 3rd Generation assay with a functional sensitivity of <=0.01 uIU/mL.  Hemoglobin A1c     Status: Abnormal   Collection Time: 07/31/16  4:49 PM  Result Value Ref Range   Hgb A1c MFr Bld 6.5 (H) 4.8 - 5.6 %    Comment: (NOTE)         Pre-diabetes: 5.7 - 6.4         Diabetes: >6.4         Glycemic control for adults with diabetes: <7.0    Mean  Plasma Glucose 140 mg/dL    Comment: (NOTE) Performed At: [facility] [address] [place], [place] [id number] Lindon Romp MD OB:[id number]   I-Stat CG4 Lactic Acid, ED     Status: None   Collection Time: 07/31/16  5:04 PM  Result Value Ref Range   Lactic Acid, Venous 1.44 0.5 - 1.9 mmol/L  CBG monitoring, ED     Status: Abnormal   Collection Time: 07/31/16  5:20 PM  Result Value Ref Range   Glucose-Capillary 197 (H) 65 - 99 mg/dL   Comment 1 Notify RN   Urinalysis, Routine w reflex microscopic     Status: Abnormal   Collection Time: 07/31/16  7:36 PM  Result Value Ref Range   Color, Urine COLORLESS (A) YELLOW   APPearance CLEAR CLEAR   Specific Gravity, Urine 1.010 1.005 - 1.030   pH 7.0 5.0 - 8.0   Glucose, UA >=500 (A) NEGATIVE mg/dL   Hgb urine dipstick NEGATIVE NEGATIVE   Bilirubin Urine NEGATIVE NEGATIVE   Ketones, ur NEGATIVE NEGATIVE mg/dL   Protein, ur NEGATIVE NEGATIVE mg/dL   Nitrite NEGATIVE NEGATIVE   Leukocytes, UA NEGATIVE NEGATIVE   RBC / HPF 0-5 0 - 5 RBC/hpf   WBC, UA 0-5 0 - 5 WBC/hpf   Bacteria, UA NONE SEEN NONE SEEN   Squamous Epithelial / LPF NONE SEEN NONE SEEN  CBG monitoring, ED     Status: Abnormal   Collection Time: 07/31/16 10:18 PM  Result Value Ref Range   Glucose-Capillary 227 (H) 65 - 99 mg/dL  MRSA PCR Screening     Status: None   Collection Time: 08/01/16  3:13 AM  Result Value Ref Range   MRSA by PCR NEGATIVE NEGATIVE    Comment:        The GeneXpert MRSA Assay (FDA approved for NASAL specimens only), is one component of a comprehensive MRSA colonization  surveillance program. It is not intended to diagnose MRSA infection nor to guide or monitor treatment for MRSA infections.   Comprehensive metabolic panel     Status: Abnormal   Collection Time: 08/01/16  5:31 AM  Result Value Ref Range   Sodium 137 135 - 145 mmol/L   Potassium 4.6 3.5 - 5.1 mmol/L    Comment: DELTA CHECK NOTED HEMOLYSIS AT THIS LEVEL MAY  AFFECT RESULT    Chloride 106 101 - 111 mmol/L   CO2 21 (L) 22 - 32 mmol/L   Glucose, Bld 254 (H) 65 - 99 mg/dL   BUN 13 6 - 20 mg/dL   Creatinine, Ser 1.13 0.61 - 1.24 mg/dL   Calcium 9.1 8.9 - 10.3 mg/dL   Total Protein 6.6 6.5 - 8.1 g/dL   Albumin 4.0 3.5 - 5.0 g/dL   AST 27 15 - 41 U/L   ALT 16 (L) 17 - 63 U/L   Alkaline Phosphatase 85 38 - 126 U/L   Total Bilirubin 1.4 (H) 0.3 - 1.2 mg/dL   GFR calc non Af Amer >60 >60 mL/min   GFR calc Af Amer >60 >60 mL/min    Comment: (NOTE) The eGFR has been calculated using the CKD EPI equation. This calculation has not been validated in all clinical situations. eGFR's persistently <60 mL/min signify possible Chronic Kidney Disease.    Anion gap 10 5 - 15  CBC     Status: Abnormal   Collection Time: 08/01/16  5:31 AM  Result Value Ref Range   WBC 16.6 (H) 4.0 - 10.5 K/uL   RBC 4.69 4.22 - 5.81 MIL/uL   Hemoglobin 15.1 13.0 - 17.0 g/dL   HCT 42.0 39.0 - 52.0 %   MCV 89.6 78.0 - 100.0 fL   MCH 32.2 26.0 - 34.0 pg   MCHC 36.0 30.0 - 36.0 g/dL   RDW 13.0 11.5 - 15.5 %   Platelets 221 150 - 400 K/uL  Protime-INR     Status: None   Collection Time: 08/01/16  5:31 AM  Result Value Ref Range   Prothrombin Time 14.6 11.4 - 15.2 seconds   INR 1.14   Lipid panel     Status: Abnormal   Collection Time: 08/01/16  5:31 AM  Result Value Ref Range   Cholesterol 177 0 - 200 mg/dL   Triglycerides 88 <150 mg/dL   HDL 37 (L) >40 mg/dL   Total CHOL/HDL Ratio 4.8 RATIO   VLDL 18 0 - 40 mg/dL   LDL Cholesterol 122 (H) 0 - 99 mg/dL    Comment:        Total Cholesterol/HDL:CHD Risk Coronary Heart Disease Risk Table                     Men   Women  1/2 Average Risk   3.4   3.3  Average Risk       5.0   4.4  2 X Average Risk   9.6   7.1  3 X Average Risk  23.4   11.0        Use the calculated Patient Ratio above and the CHD Risk Table to determine the patient's CHD Risk.        ATP III CLASSIFICATION (LDL):  <100     mg/dL   Optimal   100-129  mg/dL   Near or Above  Optimal  130-159  mg/dL   Borderline  160-189  mg/dL   High  >190     mg/dL   Very High   Glucose, capillary     Status: Abnormal   Collection Time: 08/01/16  8:02 AM  Result Value Ref Range   Glucose-Capillary 230 (H) 65 - 99 mg/dL   Comment 1 Capillary Specimen    Comment 2 Notify RN     X-ray Chest Pa And Lateral  Result Date: 08/01/2016 CLINICAL DATA:  Shortness of breath. EXAM: CHEST  2 VIEW COMPARISON:  07/31/2016 FINDINGS: Mediastinum and hilar structures normal. Heart size stable. No focal infiltrate. Low lung volumes. No pleural effusion or pneumothorax . Degenerative changes thoracic spine. IMPRESSION: Low lung volumes with mild basilar atelectasis . No acute infiltrate noted. Electronically Signed   By: Marcello Moores  Register   On: 08/01/2016 07:02   Dg Chest 2 View  Result Date: 07/31/2016 CLINICAL DATA:  Sore throat. EXAM: CHEST  2 VIEW COMPARISON:  None. FINDINGS: The heart size and mediastinal contours are within normal limits. Both lungs are clear. The visualized skeletal structures are unremarkable. IMPRESSION: No active cardiopulmonary disease. Electronically Signed   By: Dorise Bullion III M.D   On: 07/31/2016 17:03   Ct Soft Tissue Neck W Contrast  Result Date: 07/31/2016 CLINICAL DATA:  Initial evaluation for anterior neck swelling with stridor. EXAM: CT NECK WITH CONTRAST TECHNIQUE: Multidetector CT imaging of the neck was performed using the standard protocol following the bolus administration of intravenous contrast. CONTRAST:  48m ISOVUE-300 IOPAMIDOL (ISOVUE-300) INJECTION 61% COMPARISON:  None available. FINDINGS: Pharynx and larynx: Oral cavity within normal limits without loculated fluid collection or mass lesion. No acute abnormality about the dentition. Palatine tonsils symmetric and normal. Parapharyngeal fat preserved. Nasopharynx normal. Epiglottis normal. Vallecula clear. Retropharyngeal soft tissues within  normal limits. Remainder of the hypopharynx and supraglottic larynx within normal limits. True cords fairly symmetric and within normal limits. Salivary glands: Salivary glands including the parotid and submandibular glands are normal. Thyroid: Thyroid is markedly enlarged and heterogeneous in appearance, suggesting thyroid goiter. Right lobe of thyroid is larger than the left. Slight substernal extension on the left into the upper mediastinum. Mass effect on the subglottic trachea which is compressed and narrowed, measuring approximately 5 mm in transverse diameter. No significant inflammatory stranding about the thyroid itself to suggest acute thyroiditis. Lymph nodes: No pathologically enlarged lymph nodes identified within the neck. Vascular: Normal intravascular enhancement seen throughout the neck. Carotid artery's are splayed at around the enlarged thyroid. Limited intracranial: Unremarkable. Visualized orbits: Globes and orbital soft tissues within normal limits. Mastoids and visualized paranasal sinuses: Mild mucosal thickening within the right maxillary sinus. Visualized paranasal sinuses are otherwise clear. Mastoids and middle ear cavities are well pneumatized and clear. Skeleton: No acute osseous abnormality. No worrisome lytic or blastic osseous lesions. Moderate degenerative spondylolysis present at C5-6 and C6-7. Upper chest: Visualized upper chest within normal limits. Partially visualized lungs are clear. Other: Incidental note made of a 17 mm hypodense collection within subcutaneous fat of the right posterior neck, likely a sebaceous cyst. No significant associated inflammatory changes. IMPRESSION: 1. Markedly enlarged heterogeneous thyroid, likely related to thyroid goiter. Secondary mass effect on the subglottic trachea which is narrowed to 5 mm in transverse diameter. 2. No other acute abnormality identified within the neck. Results were discussed by telephone at the time of interpretation on  07/31/2016 at approximately 3:45 p.m. with Dr. CZenovia Jarred Electronically Signed   By: BMarland Kitchen  Jeannine Boga M.D.   On: 07/31/2016 16:02    EAD:GNPHQNET except as listed in admit H&P  Blood pressure (!) 149/104, pulse 64, temperature 98.3 F (36.8 C), temperature source Oral, resp. rate 17, height '5\' 11"'  (1.803 m), weight 101.9 kg (224 lb 10.4 oz), SpO2 95 %.  PHYSICAL EXAM: Overall appearance:  Healthy appearing, in no distress, Sitting upright. Attempting to lie him back supine makes it very uncomfortable for him and he develops dyspnea and increasing stridor. Head:  Normocephalic, atraumatic. Ears: External auditory canals are clear; tympanic membranes are intact in the middle ears are free of any effusion. Nose: External nose is healthy in appearance. Internal nasal exam free of any lesions or obstruction. Oral Cavity/Pharynx:  There are no mucosal lesions or masses identified. Larynx/Hypopharynx: Deferred Neuro:  No identifiable neurologic deficits. Neck: Significant thyroid enlargement, right side greater than left. Thyroid is soft. There is no distinct mass palpable outside of the thyroid..  Studies Reviewed: CT neck reviewed.  Procedures: none   Assessment/Plan: Large thyroid goiter. This is causing obstructing symptoms. This is likely benign although there is a small chance of thyroid cancer and a smaller chance of anaplastic cancer. Treatment of choice is going to be total thyroidectomy. We discussed this at great length including risks and benefits including recurrent nerve injury, hypocalcemia, possible need for tracheostomy. We discussed that there is a possible need to keep him intubated overnight after the surgery. He is agreeable. All questions were answered. We will try to schedule for later this week.  Aleene Swanner 08/01/2016, 9:52 AM

## 2016-08-03 NOTE — Progress Notes (Signed)
Spoke with Melody RN on 2H. She spoke with Dr Constance Holster and patient able to transfer to stepdown. Will put in transfer order.

## 2016-08-03 NOTE — Care Management Note (Signed)
Case Management Note  Patient Details  Name: Timothy Page MRN: [id number] Date of Birth: 1955-05-30  Subjective/Objective:  From home, pta indep, s/p Total thyroidectomy.  He has [employer].  PCP   Unice Cobble              Action/Plan: NCM will follow for dc needs.   Expected Discharge Date:  08/03/16               Expected Discharge Plan:  Home/Self Care  In-House Referral:     Discharge planning Services  CM Consult  Post Acute Care Choice:    Choice offered to:     DME Arranged:    DME Agency:     HH Arranged:    HH Agency:     Status of Service:  In process, will continue to follow  If discussed at Long Length of Stay Meetings, dates discussed:    Additional Comments:  Zenon Mayo, RN 08/03/2016, 4:09 PM

## 2016-08-03 NOTE — Progress Notes (Signed)
08/03/2016 6:13 PM  Timothy Page [id number]  Post-Op Check   Temp:  [97.9 F (36.6 C)-98.6 F (37 C)] 98 F (36.7 C) (06/20 1255) Pulse Rate:  [50-85] 64 (06/20 1255) Resp:  [0-24] 13 (06/20 1255) BP: (104-181)/(64-103) 169/96 (06/20 1255) SpO2:  [93 %-98 %] 95 % (06/20 1255) Weight:  [102.9 kg (226 lb 13.7 oz)-103.7 kg (228 lb 9.9 oz)] 103.7 kg (228 lb 9.9 oz) (06/20 1255),     Intake/Output Summary (Last 24 hours) at 08/03/16 1813 Last data filed at 08/03/16 1242  Gross per 24 hour  Intake             1020 ml  Output              955 ml  Net               65 ml   JP 100 ml  Results for orders placed or performed during the hospital encounter of 07/31/16 (from the past 24 hour(s))  Glucose, capillary     Status: Abnormal   Collection Time: 08/02/16  9:02 PM  Result Value Ref Range   Glucose-Capillary 249 (H) 65 - 99 mg/dL  CBC     Status: Abnormal   Collection Time: 08/03/16  4:29 AM  Result Value Ref Range   WBC 14.6 (H) 4.0 - 10.5 K/uL   RBC 4.37 4.22 - 5.81 MIL/uL   Hemoglobin 13.3 13.0 - 17.0 g/dL   HCT 39.0 39.0 - 52.0 %   MCV 89.2 78.0 - 100.0 fL   MCH 30.4 26.0 - 34.0 pg   MCHC 34.1 30.0 - 36.0 g/dL   RDW 12.9 11.5 - 15.5 %   Platelets 289 150 - 400 K/uL  Basic metabolic panel     Status: Abnormal   Collection Time: 08/03/16  4:29 AM  Result Value Ref Range   Sodium 139 135 - 145 mmol/L   Potassium 4.0 3.5 - 5.1 mmol/L   Chloride 106 101 - 111 mmol/L   CO2 24 22 - 32 mmol/L   Glucose, Bld 165 (H) 65 - 99 mg/dL   BUN 21 (H) 6 - 20 mg/dL   Creatinine, Ser 1.04 0.61 - 1.24 mg/dL   Calcium 8.7 (L) 8.9 - 10.3 mg/dL   GFR calc non Af Amer >60 >60 mL/min   GFR calc Af Amer >60 >60 mL/min   Anion gap 9 5 - 15  Glucose, capillary     Status: Abnormal   Collection Time: 08/03/16 10:31 AM  Result Value Ref Range   Glucose-Capillary 157 (H) 65 - 99 mg/dL   Page 1 Notify RN   Calcium     Status: Abnormal   Collection Time: 08/03/16 11:42 AM  Result  Value Ref Range   Calcium 8.7 (L) 8.9 - 10.3 mg/dL  Glucose, capillary     Status: Abnormal   Collection Time: 08/03/16  2:30 PM  Result Value Ref Range   Glucose-Capillary 120 (H) 65 - 99 mg/dL  Glucose, capillary     Status: Abnormal   Collection Time: 08/03/16  4:09 PM  Result Value Ref Range   Glucose-Capillary 223 (H) 65 - 99 mg/dL    SUBJECTIVE:  Mod pain.   Breathing well.  Swallowing OK.  Voice weak.  OBJECTIVE:   Neck flat.  Voice sl raspy, not diplophonic.  Drain functional  IMPRESSION:  Satisfactory check  PLAN:  Afraid of pain medication for fear of addiction!  Jodi Marble

## 2016-08-03 NOTE — Progress Notes (Signed)
Patient wants to talk to Dr. Constance Holster about the surgery before signing consent.

## 2016-08-03 NOTE — Anesthesia Postprocedure Evaluation (Signed)
Anesthesia Post Note  Patient: Timothy Page  Procedure(s) Performed: Procedure(s) (LRB): THYROIDECTOMY (N/A)     Patient location during evaluation: PACU Anesthesia Type: General Level of consciousness: awake and alert Pain management: pain level controlled Vital Signs Assessment: post-procedure vital signs reviewed and stable Respiratory status: spontaneous breathing, nonlabored ventilation and respiratory function stable Cardiovascular status: blood pressure returned to baseline and stable Postop Assessment: no signs of nausea or vomiting Anesthetic complications: no    Last Vitals:  Vitals:   08/03/16 1115 08/03/16 1123  BP: (!) 167/96 (!) 173/91  Pulse: 60 62  Resp: 14 15  Temp:      Last Pain:  Vitals:   08/03/16 1135  TempSrc:   PainSc: 0-No pain                 Lynda Rainwater

## 2016-08-03 NOTE — Progress Notes (Signed)
08/03/2016 Patient is from [facility], came to 45M from surgery at 1330. He was alert, oriented and ambulatory. Patient has a Thyroidectomy the area is clean and dry. Dremabond to the area. JP drain the drainage is bloody. Patient heels are dry. Suture removal kit was place in room. Elink and Central monitor was called. [facility] RN.

## 2016-08-03 NOTE — Progress Notes (Signed)
reportm given to [employer] as caregiver

## 2016-08-03 NOTE — Progress Notes (Signed)
PROGRESS NOTE   Timothy Page  CBU:[id number]    DOB: Sep 01, 1955    DOA: 07/31/2016  PCP: Hendricks Limes, MD   I have briefly reviewed patients previous medical records in [place].  Brief Narrative:  61 year old male with PMH of HTN, DM 2, prior remote MI, asthma, presented to ED with progressively enlarging neck mass and difficulty breathing. He reports thyroid enlargement identified by his PCP a year ago who has since retired (however I'm unable to find note in [facility] regarding thyroid enlargement). He does not follow with her PCP since. Since 3 months, he has noted gradual increase in size of the swelling which progressively got worse a couple days ago especially lying flat, had some cough after eating, difficulty swallowing large bites of food. He denies neck pain, fever, chills, extreme heat or cold, visual disturbances, weight loss. CT confirmed large goiter with mass effect. ENT consulted and plan surgery on 08/03/16.   Assessment & Plan:   Active Problems:   Lipoma   Essential hypertension   Mixed hyperlipidemia   Type 2 diabetes mellitus (HCC)   Hx of adenomatous polyp of colon   Goiter  Large goiter with obstructing symptoms: - Clinically euthyroid. TSH normal/0.506. ENT consultation appreciated and suspect that this is likely benign with a small chance of malignancy. Recommend total thyroidectomy , went for total thyroidectomy on 11/03/2016 by Dr. Constance Holster , continue to monitor and stepdown overnight . - Check CBC, BMP in a.m., monitor calcium level closely discussed with Dr. Constance Holster , plan for surgery 08/03/16,   Essential hypertension:  - Better controlled after increasing amlodipine, but still elevated today, so we'll increase clonidine from 0.1-0.2 mg twice a day - Continue when necessary hydralazine  Uncontrolled DM 2:  - A1c 6.5 suggests good outpatient control. Uncontrolled here due to steroids. Better controlled after starting Lantus 5 units daily, continue with  insulin sliding scale   Hyperlipidemia:  - Not on medications.  History of MI:  - Denies chest pain. Continue with aspirin  Leukocytosis: -  most likely related to steroid, nontoxic appearing, negative urinalysis, chest x-ray without infiltrate, continue to monitor off antibiotics   DVT prophylaxis:  Lovenox Code Status:  full Family Communication:  Wife at bedside Disposition:  DC home when medically improved.   Consultants:   ENT/Dr. Constance Holster   Procedures:   Total thyroidectomy, right lobe marked with a suture by Dr. Constance Holster on 6/20  Antimicrobials:   IV vancomycin and Zosyn-discontinued 6/18    Subjective:  Reports he is feeling well after surgery, but reports some dryness secretion and sore throat after surgery , is likely related to intubation  Objective:  Vitals:   08/03/16 1200 08/03/16 1221 08/03/16 1240 08/03/16 1255  BP: (!) 181/98   (!) 169/96  Pulse: 61 64 (!) 59 64  Resp: 12 (!) 23 18 13   Temp:   98 F (36.7 C) 98 F (36.7 C)  TempSrc:    Oral  SpO2: 96% 97% 97% 95%  Weight:    103.7 kg (228 lb 9.9 oz)  Height:    5\' 11"  (1.803 m)    Examination:  AA3, no apparent distress,  Neck Low surgical scar, good, no bleed or discharge Good air entry bilaterally, no wheezing, no use of accessory muscle S1 and S2 heard, no rubs murmurs or gallops, regular rate and rhythm Abdomen soft, nontender, nondistended, bowel sounds present Extremities with no edema, clubbing or cyanosis Judgement and insight appear normal.  Data Reviewed: I have personally reviewed following labs and imaging studies  CBC:  Recent Labs Lab 07/31/16 1422 07/31/16 1433 08/01/16 0531 08/02/16 0210 08/03/16 0429  WBC 17.1*  --  16.6* 19.5* 14.6*  NEUTROABS 15.3*  --   --   --   --   HGB 15.9 15.6 15.1 13.8 13.3  HCT 44.8 46.0 42.0 40.7 39.0  MCV 87.7  --  89.6 89.6 89.2  PLT 230  --  221 280 841   Basic Metabolic Panel:  Recent Labs Lab 07/31/16 1433 08/01/16 0531  08/03/16 0429 08/03/16 1142  NA 139 137 139  --   K 3.8 4.6 4.0  --   CL 101 106 106  --   CO2  --  21* 24  --   GLUCOSE 174* 254* 165*  --   BUN 11 13 21*  --   CREATININE 1.00 1.13 1.04  --   CALCIUM  --  9.1 8.7* 8.7*   Liver Function Tests:  Recent Labs Lab 08/01/16 0531  AST 27  ALT 16*  ALKPHOS 85  BILITOT 1.4*  PROT 6.6  ALBUMIN 4.0   Coagulation Profile:  Recent Labs Lab 08/01/16 0531  INR 1.14   HbA1C: No results for input(s): HGBA1C in the last 72 hours. CBG:  Recent Labs Lab 08/02/16 1622 08/02/16 2102 08/03/16 1031 08/03/16 1430 08/03/16 1609  GLUCAP 263* 249* 157* 120* 223*    Recent Results (from the past 240 hour(s))  Culture, blood (Routine X 2) w Reflex to ID Panel     Status: None (Preliminary result)   Collection Time: 07/31/16  8:01 PM  Result Value Ref Range Status   Specimen Description BLOOD RIGHT HAND  Final   Special Requests   Final    BOTTLES DRAWN AEROBIC AND ANAEROBIC Blood Culture adequate volume   Culture NO GROWTH 2 DAYS  Final   Report Status PENDING  Incomplete  Culture, blood (Routine X 2) w Reflex to ID Panel     Status: None (Preliminary result)   Collection Time: 07/31/16  8:08 PM  Result Value Ref Range Status   Specimen Description BLOOD LEFT HAND  Final   Special Requests   Final    BOTTLES DRAWN AEROBIC AND ANAEROBIC Blood Culture adequate volume   Culture NO GROWTH 2 DAYS  Final   Report Status PENDING  Incomplete  MRSA PCR Screening     Status: None   Collection Time: 08/01/16  3:13 AM  Result Value Ref Range Status   MRSA by PCR NEGATIVE NEGATIVE Final    Comment:        The GeneXpert MRSA Assay (FDA approved for NASAL specimens only), is one component of a comprehensive MRSA colonization surveillance program. It is not intended to diagnose MRSA infection nor to guide or monitor treatment for MRSA infections.   Surgical pcr screen     Status: None   Collection Time: 08/02/16  5:30 PM  Result  Value Ref Range Status   MRSA, PCR NEGATIVE NEGATIVE Final   Staphylococcus aureus NEGATIVE NEGATIVE Final    Comment:        The Xpert SA Assay (FDA approved for NASAL specimens in patients over 79 years of age), is one component of a comprehensive surveillance program.  Test performance has been validated by [facility] for patients greater than or equal to 42 year old. It is not intended to diagnose infection nor to guide or monitor treatment.  Radiology Studies: No results found.      Scheduled Meds: . ALPRAZolam      . amLODipine  10 mg Oral Daily  . aspirin EC  81 mg Oral Daily  . cloNIDine  0.1 mg Oral BID  . enoxaparin (LOVENOX) injection  40 mg Subcutaneous QHS  . insulin aspart  0-5 Units Subcutaneous QHS  . insulin aspart  0-9 Units Subcutaneous TID WC  . insulin glargine  5 Units Subcutaneous Daily  . [START ON 08/04/2016] levothyroxine  100 mcg Oral QAC breakfast  . oxyCODONE       Continuous Infusions:    LOS: 3 days     Phillips Climes, MD [facility] Pager [phone]  If 7PM-7AM, please contact night-coverage [email or web address] Password [email or web address] 08/03/2016, 5:52 PM

## 2016-08-03 NOTE — Anesthesia Procedure Notes (Addendum)
Procedure Name: Intubation Date/Time: 08/03/2016 8:37 AM Performed by: Kyung Rudd Pre-anesthesia Checklist: Patient identified, Emergency Drugs available, Suction available and Patient being monitored Patient Re-evaluated:Patient Re-evaluated prior to inductionOxygen Delivery Method: Circle system utilized Preoxygenation: Pre-oxygenation with 100% oxygen Intubation Type: IV induction and Cricoid Pressure applied Ventilation: Mask ventilation without difficulty Laryngoscope Size: Mac and 4 Grade View: Grade II Tube type: Oral Tube size: 7.5 mm Number of attempts: 1 Airway Equipment and Method: Stylet Placement Confirmation: ETT inserted through vocal cords under direct vision,  positive ETCO2 and breath sounds checked- equal and bilateral Secured at: 22 cm Tube secured with: Tape Dental Injury: Teeth and Oropharynx as per pre-operative assessment  Comments: AOI using MAC 4, Grade II view due to trachea deviation to left caused by mass.

## 2016-08-03 NOTE — Discharge Instructions (Signed)
You may shower and use soap and water. Do not use any creams, oils or ointment. ° °

## 2016-08-03 NOTE — Anesthesia Preprocedure Evaluation (Addendum)
Anesthesia Evaluation  Patient identified by MRN, date of birth, ID band Patient awake    Reviewed: Allergy & Precautions, NPO status , Patient's Chart, lab work & pertinent test results  Airway Mallampati: II  TM Distance: >3 FB Neck ROM: Full    Dental no notable dental hx. (+) Teeth Intact   Pulmonary neg pulmonary ROS,    Pulmonary exam normal breath sounds clear to auscultation       Cardiovascular hypertension, Pt. on medications + CAD and + Past MI  negative cardio ROS Normal cardiovascular exam Rhythm:Regular Rate:Normal     Neuro/Psych negative neurological ROS  negative psych ROS   GI/Hepatic negative GI ROS, Neg liver ROS,   Endo/Other  negative endocrine ROSdiabetes, Type 2, Insulin Dependent, Oral Hypoglycemic Agents  Renal/GU negative Renal ROS  negative genitourinary   Musculoskeletal negative musculoskeletal ROS (+) Arthritis ,   Abdominal (+) + obese,   Peds negative pediatric ROS (+)  Hematology negative hematology ROS (+)   Anesthesia Other Findings   Reproductive/Obstetrics negative OB ROS                            Anesthesia Physical Anesthesia Plan  ASA: III  Anesthesia Plan: General   Post-op Pain Management:    Induction: Intravenous  PONV Risk Score and Plan: 4 or greater and Ondansetron, Propofol, Midazolam and Treatment may vary due to age or medical condition  Airway Management Planned: Oral ETT  Additional Equipment:   Intra-op Plan:   Post-operative Plan: Extubation in OR and Possible Post-op intubation/ventilation  Informed Consent: I have reviewed the patients History and Physical, chart, labs and discussed the procedure including the risks, benefits and alternatives for the proposed anesthesia with the patient or authorized representative who has indicated his/her understanding and acceptance.   Dental advisory given  Plan Discussed with:  CRNA  Anesthesia Plan Comments:         Anesthesia Quick Evaluation

## 2016-08-03 NOTE — Interval H&P Note (Signed)
History and Physical Interval Note:  08/03/2016 8:21 AM  Timothy Page  has presented today for surgery, with the diagnosis of Large thyroid goiter  The various methods of treatment have been discussed with the patient and family. After consideration of risks, benefits and other options for treatment, the patient has consented to  Procedure(s) with comments: THYROIDECTOMY (N/A) - NEED RNFA as a surgical intervention .  The patient's history has been reviewed, patient examined, no change in status, stable for surgery.  I have reviewed the patient's chart and labs.  Questions were answered to the patient's satisfaction.     Darinda [place]

## 2016-08-03 NOTE — Transfer of Care (Signed)
Immediate Anesthesia Transfer of Care Note  Patient: Timothy Page  Procedure(s) Performed: Procedure(s) with comments: THYROIDECTOMY (N/A) - Total Thyroidectomy   Patient Location: PACU  Anesthesia Type:General  Level of Consciousness: awake, alert  and oriented  Airway & Oxygen Therapy: Patient Spontanous Breathing and Patient connected to face mask oxygen  Post-op Assessment: Report given to RN, Post -op Vital signs reviewed and stable and Patient moving all extremities X 4  Post vital signs: Reviewed and stable  Last Vitals:  Vitals:   08/03/16 0646 08/03/16 1028  BP: (!) 152/98   Pulse: (!) 50   Resp: 19   Temp:  (P) 36.7 C    Last Pain:  Vitals:   08/03/16 0400  TempSrc: Oral  PainSc:          Complications: No apparent anesthesia complications

## 2016-08-04 ENCOUNTER — Encounter (HOSPITAL_COMMUNITY): Payer: Self-pay | Admitting: Otolaryngology

## 2016-08-04 LAB — CBC
HCT: 38.7 % — ABNORMAL LOW (ref 39.0–52.0)
HEMOGLOBIN: 13 g/dL (ref 13.0–17.0)
MCH: 30.3 pg (ref 26.0–34.0)
MCHC: 33.6 g/dL (ref 30.0–36.0)
MCV: 90.2 fL (ref 78.0–100.0)
Platelets: 260 10*3/uL (ref 150–400)
RBC: 4.29 MIL/uL (ref 4.22–5.81)
RDW: 13 % (ref 11.5–15.5)
WBC: 10.6 10*3/uL — ABNORMAL HIGH (ref 4.0–10.5)

## 2016-08-04 LAB — GLUCOSE, CAPILLARY
GLUCOSE-CAPILLARY: 148 mg/dL — AB (ref 65–99)
Glucose-Capillary: 149 mg/dL — ABNORMAL HIGH (ref 65–99)
Glucose-Capillary: 130 mg/dL — ABNORMAL HIGH (ref 65–99)
Glucose-Capillary: 163 mg/dL — ABNORMAL HIGH (ref 65–99)

## 2016-08-04 LAB — BASIC METABOLIC PANEL
Anion gap: 7 (ref 5–15)
BUN: 16 mg/dL (ref 6–20)
CHLORIDE: 102 mmol/L (ref 101–111)
CO2: 28 mmol/L (ref 22–32)
CREATININE: 1.18 mg/dL (ref 0.61–1.24)
Calcium: 8.1 mg/dL — ABNORMAL LOW (ref 8.9–10.3)
GFR calc Af Amer: >60 mL/min (ref >60)
GFR calc non Af Amer: >60 mL/min (ref >60)
Glucose, Bld: 155 mg/dL — ABNORMAL HIGH (ref 65–99)
Potassium: 3.6 mmol/L (ref 3.5–5.1)
SODIUM: 137 mmol/L (ref 135–145)

## 2016-08-04 MED ORDER — CALCIUM CARBONATE ANTACID 500 MG PO CHEW
1.0000 | CHEWABLE_TABLET | Freq: Three times a day (TID) | ORAL | Status: DC
  Administered 2016-08-04 – 2016-08-05 (×3): 200 mg via ORAL
  Filled 2016-08-04 (×3): qty 1

## 2016-08-04 MED ORDER — RAMIPRIL 10 MG PO CAPS
10.0000 mg | ORAL_CAPSULE | Freq: Every day | ORAL | Status: DC
  Administered 2016-08-04 – 2016-08-05 (×2): 10 mg via ORAL
  Filled 2016-08-04 (×2): qty 1

## 2016-08-04 MED ORDER — LEVOTHYROXINE SODIUM 75 MCG PO TABS
150.0000 ug | ORAL_TABLET | Freq: Every day | ORAL | Status: DC
  Administered 2016-08-05: 150 ug via ORAL
  Filled 2016-08-04: qty 2

## 2016-08-04 MED ORDER — POLYETHYLENE GLYCOL 3350 17 G PO PACK: 17.0000 g | PACK | Freq: Every day | ORAL | Status: DC | PRN

## 2016-08-04 MED ORDER — SENNOSIDES-DOCUSATE SODIUM 8.6-50 MG PO TABS
2.0000 | ORAL_TABLET | Freq: Two times a day (BID) | ORAL | Status: DC
  Administered 2016-08-04 – 2016-08-05 (×3): 2 via ORAL
  Filled 2016-08-04 (×3): qty 2

## 2016-08-04 NOTE — Progress Notes (Signed)
Patient ID: Timothy Page, male   DOB: February 18, 1955, 61 y.o.   MRN: [id number]  Postoperative day one.  No problems overnight. He feels that he is breathing better. He still feels uncomfortable lying supine. He has been eating soft foods. He complains of some sore throat.  On exam, his voice is nice and strong. He has a hard glottic stop when he coughs. Neck looks excellent. The JP drain was removed. There is no stridor.  Stable postop. From a surgical standpoint he may be discharged from the ICU and from the hospital when medically appropriate. He would like to spend one more night due to some anxiety and fear. Calcium has changed minimally. We can stop checking that. I will have him follow-up with me in 1 week.

## 2016-08-04 NOTE — Progress Notes (Signed)
PROGRESS NOTE   Timothy Page  YBO:[id number]    DOB: [birth year]    DOA: 07/31/2016  PCP: Hendricks Limes, MD   I have briefly reviewed patients previous medical records in [place].  Brief Narrative:  61 year old male with PMH of HTN, DM 2, prior remote MI, asthma, presented to ED with progressively enlarging neck mass and difficulty breathing. He reports thyroid enlargement identified by his PCP a year ago who has since retired (however I'm unable to find note in [facility] regarding thyroid enlargement). He does not follow with her PCP since. Since 3 months, he has noted gradual increase in size of the swelling which progressively got worse a couple days ago especially lying flat, had some cough after eating, difficulty swallowing large bites of food. He denies neck pain, fever, chills, extreme heat or cold, visual disturbances, weight loss. CT confirmed large goiter with mass effect. ENT consulted and Patient went for total thyroidectomy on 08/03/16.    Assessment & Plan:   Active Problems:   Lipoma   Essential hypertension   Mixed hyperlipidemia   Type 2 diabetes mellitus (HCC)   Hx of adenomatous polyp of colon   Goiter  Large goiter with obstructing symptoms: - Clinically euthyroid. TSH normal/0.506. ENT consultation appreciated and suspect that this is likely benign with a small chance of malignancy. Recommend total thyroidectomy , went for total thyroidectomy on 11/03/2016 by Dr. Constance Holster , he was monitored overnight and stepdown, overall calcium has changed minimally overnight 8.7>8.1. - Started on Synthroid 100 g daily Per ENT, to follow with Dr. Constance Holster in one week  Essential hypertension:  - Better controlled after increasing amlodipine, and clonidine from 0.1-0.2 mg twice a day - Continue when necessary hydralazine  Uncontrolled DM 2:  - A1c 6.5 suggests good outpatient control. Better controlled after stopping steroids, and  starting Lantus 5 units daily, continue with  insulin sliding scale  - Continue to hold metformin during hospital stay  Hyperlipidemia:  - Not on medications.  History of MI:  - Denies chest pain. Continue with aspirin  Leukocytosis: -  most likely related to steroid, nontoxic appearing, negative urinalysis, chest x-ray without infiltrate, continue to monitor off antibiotics , resolved.  DVT prophylaxis:  Lovenox Code Status:  full Family Communication:  Wife at bedside Disposition:  DC home when medically improved.   Consultants:   ENT/Dr. Constance Holster   Procedures:   Total thyroidectomy, right lobe marked with a suture by Dr. Constance Holster on 6/20  Antimicrobials:   IV vancomycin and Zosyn-discontinued 6/18    Subjective:  Reports he is feeling well after surgery, but reports some dryness secretion and sore throat after surgery , is likely related to intubation  Objective:  Vitals:   08/04/16 0517 08/04/16 0521 08/04/16 0718 08/04/16 0941  BP: 125/77 125/77 (!) 142/90 (!) 142/85  Pulse: 60 (!) 54 (!) 55   Resp: 18 15 12    Temp: 98.3 F (36.8 C) 98.3 F (36.8 C) 98.2 F (36.8 C)   TempSrc: Oral Oral Oral   SpO2: 95% 93% 95%   Weight:      Height:        Examination:  AA3, no apparent distress,  Neck surgical scar healing nicely, no discharge, no bleed, JP drain present  Good air entry bilaterally, no wheezing, negative accessory muscle S1 and S2 heard, no rubs murmurs or gallops, regular rate and rhythm Abdomen soft, nontender, nondistended, bowel sounds present Extremities with no edema, clubbing or cyanosis Judgement  and insight appear normal.    Data Reviewed: I have personally reviewed following labs and imaging studies  CBC:  Recent Labs Lab 07/31/16 1422 07/31/16 1433 08/01/16 0531 08/02/16 0210 08/03/16 0429 08/04/16 0338  WBC 17.1*  --  16.6* 19.5* 14.6* 10.6*  NEUTROABS 15.3*  --   --   --   --   --   HGB 15.9 15.6 15.1 13.8 13.3 13.0  HCT 44.8 46.0 42.0 40.7 39.0 38.7*  MCV 87.7  --  89.6  89.6 89.2 90.2  PLT 230  --  221 280 289 073   Basic Metabolic Panel:  Recent Labs Lab 07/31/16 1433 08/01/16 0531 08/03/16 0429 08/03/16 1142 08/03/16 1916 08/04/16 0338  NA 139 137 139  --   --  137  K 3.8 4.6 4.0  --   --  3.6  CL 101 106 106  --   --  102  CO2  --  21* 24  --   --  28  GLUCOSE 174* 254* 165*  --   --  155*  BUN 11 13 21*  --   --  16  CREATININE 1.00 1.13 1.04  --   --  1.18  CALCIUM  --  9.1 8.7* 8.7* 8.4* 8.1*   Liver Function Tests:  Recent Labs Lab 08/01/16 0531  AST 27  ALT 16*  ALKPHOS 85  BILITOT 1.4*  PROT 6.6  ALBUMIN 4.0   Coagulation Profile:  Recent Labs Lab 08/01/16 0531  INR 1.14   HbA1C: No results for input(s): HGBA1C in the last 72 hours. CBG:  Recent Labs Lab 08/03/16 1430 08/03/16 1609 08/03/16 2131 08/04/16 0730 08/04/16 1234  GLUCAP 120* 223* 195* 148* 149*    Recent Results (from the past 240 hour(s))  Culture, blood (Routine X 2) w Reflex to ID Panel     Status: None (Preliminary result)   Collection Time: 07/31/16  8:01 PM  Result Value Ref Range Status   Specimen Description BLOOD RIGHT HAND  Final   Special Requests   Final    BOTTLES DRAWN AEROBIC AND ANAEROBIC Blood Culture adequate volume   Culture NO GROWTH 3 DAYS  Final   Report Status PENDING  Incomplete  Culture, blood (Routine X 2) w Reflex to ID Panel     Status: None (Preliminary result)   Collection Time: 07/31/16  8:08 PM  Result Value Ref Range Status   Specimen Description BLOOD LEFT HAND  Final   Special Requests   Final    BOTTLES DRAWN AEROBIC AND ANAEROBIC Blood Culture adequate volume   Culture NO GROWTH 3 DAYS  Final   Report Status PENDING  Incomplete  MRSA PCR Screening     Status: None   Collection Time: 08/01/16  3:13 AM  Result Value Ref Range Status   MRSA by PCR NEGATIVE NEGATIVE Final    Comment:        The GeneXpert MRSA Assay (FDA approved for NASAL specimens only), is one component of a comprehensive MRSA  colonization surveillance program. It is not intended to diagnose MRSA infection nor to guide or monitor treatment for MRSA infections.   Surgical pcr screen     Status: None   Collection Time: 08/02/16  5:30 PM  Result Value Ref Range Status   MRSA, PCR NEGATIVE NEGATIVE Final   Staphylococcus aureus NEGATIVE NEGATIVE Final    Comment:        The Xpert SA Assay (FDA approved for NASAL specimens in  patients over 60 years of age), is one component of a comprehensive surveillance program.  Test performance has been validated by [facility] for patients greater than or equal to 70 year old. It is not intended to diagnose infection nor to guide or monitor treatment.          Radiology Studies: No results found.      Scheduled Meds: . amLODipine  10 mg Oral Daily  . aspirin EC  81 mg Oral Daily  . cloNIDine  0.2 mg Oral BID  . enoxaparin (LOVENOX) injection  40 mg Subcutaneous QHS  . insulin aspart  0-5 Units Subcutaneous QHS  . insulin aspart  0-9 Units Subcutaneous TID WC  . insulin glargine  5 Units Subcutaneous Daily  . levothyroxine  100 mcg Oral QAC breakfast  . senna-docusate  2 tablet Oral BID   Continuous Infusions: . dextrose 5 % and 0.2 % NaCl 75 mL/hr at 08/04/16 0718     LOS: 4 days     Phillips Climes, MD [facility] Pager [phone]  If 7PM-7AM, please contact night-coverage [email or web address] Password [email or web address] 08/04/2016, 1:12 PM

## 2016-08-05 ENCOUNTER — Inpatient Hospital Stay (HOSPITAL_COMMUNITY): Payer: 59

## 2016-08-05 LAB — CBC
HCT: 39.6 % (ref 39.0–52.0)
Hemoglobin: 13.5 g/dL (ref 13.0–17.0)
MCH: 30.8 pg (ref 26.0–34.0)
MCHC: 34.1 g/dL (ref 30.0–36.0)
MCV: 90.4 fL (ref 78.0–100.0)
PLATELETS: 271 10*3/uL (ref 150–400)
RBC: 4.38 MIL/uL (ref 4.22–5.81)
RDW: 12.9 % (ref 11.5–15.5)
WBC: 14.5 10*3/uL — AB (ref 4.0–10.5)

## 2016-08-05 LAB — BASIC METABOLIC PANEL
Anion gap: 6 (ref 5–15)
BUN: 10 mg/dL (ref 6–20)
CO2: 28 mmol/L (ref 22–32)
CREATININE: 1.2 mg/dL (ref 0.61–1.24)
Calcium: 8.1 mg/dL — ABNORMAL LOW (ref 8.9–10.3)
Chloride: 104 mmol/L (ref 101–111)
GFR calc Af Amer: >60 mL/min (ref >60)
Glucose, Bld: 137 mg/dL — ABNORMAL HIGH (ref 65–99)
POTASSIUM: 3.6 mmol/L (ref 3.5–5.1)
SODIUM: 138 mmol/L (ref 135–145)

## 2016-08-05 LAB — CULTURE, BLOOD (ROUTINE X 2)
CULTURE: NO GROWTH
CULTURE: NO GROWTH
Special Requests: ADEQUATE
Special Requests: ADEQUATE

## 2016-08-05 LAB — GLUCOSE, CAPILLARY
GLUCOSE-CAPILLARY: 152 mg/dL — AB (ref 65–99)
GLUCOSE-CAPILLARY: 108 mg/dL — AB (ref 65–99)
Glucose-Capillary: 105 mg/dL — ABNORMAL HIGH (ref 65–99)

## 2016-08-05 MED ORDER — SENNOSIDES-DOCUSATE SODIUM 8.6-50 MG PO TABS: 1.0000 | ORAL_TABLET | Freq: Two times a day (BID) | ORAL | 0 refills | Status: DC

## 2016-08-05 MED ORDER — GUAIFENESIN ER 600 MG PO TB12: 1200.0000 mg | ORAL_TABLET | Freq: Two times a day (BID) | ORAL | 0 refills | Status: DC

## 2016-08-05 MED ORDER — GUAIFENESIN ER 600 MG PO TB12
1200.0000 mg | ORAL_TABLET | Freq: Two times a day (BID) | ORAL | Status: DC
  Filled 2016-08-05: qty 2

## 2016-08-05 MED ORDER — CALCIUM CARBONATE ANTACID 500 MG PO CHEW: 1.0000 | CHEWABLE_TABLET | Freq: Three times a day (TID) | ORAL | 0 refills | Status: DC

## 2016-08-05 MED ORDER — ALPRAZOLAM 0.25 MG PO TABS: 0.2500 mg | ORAL_TABLET | Freq: Two times a day (BID) | ORAL | 0 refills | Status: DC | PRN

## 2016-08-05 MED ORDER — LEVOTHYROXINE SODIUM 150 MCG PO TABS: 150.0000 ug | ORAL_TABLET | Freq: Every day | ORAL | 0 refills | Status: DC

## 2016-08-05 NOTE — Discharge Summary (Signed)
Timothy Page, is a 61 y.o. male  DOB May 25, 1955  MRN [id number].  Admission date:  07/31/2016  Admitting Physician  Kinnie Feil, MD  Discharge Date:  08/05/2016   Primary MD  No primary care provider on file.  Recommendations for primary care physician for things to follow:  - Please check CBC, BMP during next visit - Patient to follow with endocrinology as an outpatient, appointment scheduled with Dr. Dwyane Dee on 7/31 - To follow with ENT in one week from discharge, please follow on final results of pathology   Admission Diagnosis  Goiter [E04.9]   Discharge Diagnosis  Goiter [E04.9]    Active Problems:   Lipoma   Essential hypertension   Mixed hyperlipidemia   Type 2 diabetes mellitus ([place])   Hx of adenomatous polyp of colon   Goiter      Past Medical History:  Diagnosis Date  . Arthritis   . Heart attack [facility]) 1980   age 67 X 2 over 70 month period  . Hx of adenomatous polyp of colon 07/02/2009  . Lipoma    back  . Seasonal allergies     Past Surgical History:  Procedure Laterality Date  . CARDIAC CATHETERIZATION  1980  . finger amputaion     with reattachement  . LASIK    . THYROIDECTOMY N/A 08/03/2016   Procedure: THYROIDECTOMY;  Surgeon: [name], MD;  Location: [place];  Service: ENT;  Laterality: N/A;  Total Thyroidectomy        History of present illness and  Hospital Course:     Kindly see H&P for history of present illness and admission details, please review complete Labs, Consult reports and Test reports for all details in brief  HPI  from the history and physical done on the day of admission 07/31/2016 HPI: Timothy Page is a 61 y.o. male with medical history significant for HTN, diabetes, prior history of MI, history of colon polyps, asthma, presenting with right neck severe pain, and enlarging mass. Patient reports that had been seen by Dr. Huey Bienenstock 1 year  ago, for the evaluation of enlarging right neck mass, which was to be monitored on a regular basis. The patient however has not been followed up over the last year. He reports that this mass has been slowly enlarging, but over the last 3 days, the mass increased significantly in size, causing him to have more wheezing, and respiratory discomfort. He denies any significant trouble swallowing. He denies any other masses on the axillary area, inguinal area, or other regions. The patient denies any fever or chills night sweats, or recent infections. He denies any recent travels. He denies any history of HIV. He denies any nausea otalgia on the R . He is very fatigue. The patient has extensive family history of cancer, and 1 and has a history of thyroid cancer, and is currently being treated. No history of lymphoma in the family.    ED Course:  BP (!) 179/94   Pulse 96  Temp 98.7 F (37.1 C) (Oral)   Resp 18   Ht 5\' 11"  (1.803 m)   Wt 99.8 kg (220 lb)   SpO2 95%   BMI 30.68 kg/m    CT of the neck shows markedly large heterogeneous thyroid, likely related to thyroid goiter. There is a secondary mass effect on this o'clock tick trachea narrowing to 5 mm transverse diameter. There is no other acute abnormalities found in the neck. Chest x-ray is pending. White count 17 otherwise normal CBC CMET is normal. Glucose is 174. Patient received  Vanc ans Zosyn per ENT recommendations. ENT is to see the patient   in consultation with further recommendations, anticipating surgery within the next few days.   Hospital Course   Large goiter with obstructing symptoms: - Clinically euthyroid. TSH normal/0.506. ENT consultation appreciated and suspect that this is likely benign with a small chance of malignancy. Recommend total thyroidectomy , went for total thyroidectomy on 11/03/2016 by Dr. Constance Holster . - Discussed with Dr. Dwyane Dee from endocrinology, he recommended Synthroid 150 g oral daily, calcium minimally  went down from 8.7-8.1, recommends total of 1500 mg of calcium daily, so he will be discharged on calcium carbonate 500 mg oral 3 times a day, follow with Dr. Dwyane Dee as an outpatient, appointment scheduled to 7/31  Essential hypertension:  - As you him home medication on discharge  Uncontrolled DM 2:  - A1c 6.5 suggests good outpatient control. Resume metformin on discharge Hyperlipidemia:  - Not on medications.  History of MI:  - Denies chest pain. Continue with aspirin  Leukocytosis: -  most likely related to steroid, nontoxic appearing, negative urinalysis, chest x-ray without infiltrate, continue to monitor off antibiotics , leukocytosis has resolved, patient with low-grade temperature 100.2 at day of discharge, reports some cough with productive sputum, this is most likely related to atelectasis, chest x-ray with no evidence of infectious process he was given incentive spirometry, flutter valve, and encouraged to use.     Discharge Condition: Stable   Follow UP  Follow-up Information    [name], MD. Schedule an appointment as soon as possible for a visit in 1 week(s).   Specialty:  Otolaryngology Contact information: [address] [place] [address] [phone]        Elayne Snare, MD Follow up.   Specialty:  Endocrinology Why:  Appointment scheduled for 09/13/2016, please be there at 8:45 AM Contact information: [address] [place] [address] [phone]             Discharge Instructions  and  Discharge Medications    Discharge Instructions    Discharge instructions    Complete by:  As directed    Follow with Primary MD  in 7 days   Get CBC, CMP, TSH checked  by Primary MD next visit.    Activity: As tolerated with Full fall precautions use walker/cane & assistance as needed   Disposition    Diet: Heart Healthy ** , with feeding assistance and aspiration precautions.  For Heart failure patients -  Check your Weight same time everyday, if you gain over 2 pounds, or you develop in leg swelling, experience more shortness of breath or chest pain, call your Primary MD immediately. Follow Cardiac Low Salt Diet and 1.5 lit/day fluid restriction.   On your next visit with your primary care physician please Get Medicines reviewed and adjusted.   Please request your Prim.MD to go over all Hospital Tests and Procedure/Radiological  results at the follow up, please get all Hospital records sent to your Prim MD by signing hospital release before you go home.   If you experience worsening of your admission symptoms, develop shortness of breath, life threatening emergency, suicidal or homicidal thoughts you must seek medical attention immediately by calling 911 or calling your MD immediately  if symptoms less severe.  You Must read complete instructions/literature along with all the possible adverse reactions/side effects for all the Medicines you take and that have been prescribed to you. Take any new Medicines after you have completely understood and accpet all the possible adverse reactions/side effects.   Do not drive, operating heavy machinery, perform activities at heights, swimming or participation in water activities or provide baby sitting services if your were admitted for syncope or siezures until you have seen by Primary MD or a Neurologist and advised to do so again.  Do not drive when taking Pain medications.    Do not take more than prescribed Pain, Sleep and Anxiety Medications  Special Instructions: If you have smoked or chewed Tobacco  in the last 2 yrs please stop smoking, stop any regular Alcohol  and or any Recreational drug use.  Wear Seat belts while driving.   Please note  You were cared for by a hospitalist during your hospital stay. If you have any questions about your discharge medications or the care you received while you were in the hospital after you are discharged,  you can call the unit and asked to speak with the hospitalist on call if the hospitalist that took care of you is not available. Once you are discharged, your primary care physician will handle any further medical issues. Please note that NO REFILLS for any discharge medications will be authorized once you are discharged, as it is imperative that you return to your primary care physician (or establish a relationship with a primary care physician if you do not have one) for your aftercare needs so that they can reassess your need for medications and monitor your lab values.   Increase activity slowly    Complete by:  As directed      Allergies as of 08/05/2016      Reactions   Tenormin [atenolol]    Severe "slowing"of functioning   Tetracycline    Rash Because of a history of documented adverse serious drug reaction;Medi Alert bracelet  is recommended   Lipitor [atorvastatin]    D/Ced by him due to Myalgias in Sept 2014      Medication List    STOP taking these medications   ADVIL PO   albuterol 108 (90 Base) MCG/ACT inhaler Commonly known as:  PROVENTIL HFA;VENTOLIN HFA     TAKE these medications   ALPRAZolam 0.25 MG tablet Commonly known as:  XANAX Take 1 tablet (0.25 mg total) by mouth 2 (two) times daily as needed for anxiety.   amLODipine 5 MG tablet Commonly known as:  NORVASC Take 1 tablet by mouth  twice a day What changed:  how much to take  how to take this  when to take this  additional instructions   aspirin 81 MG tablet Take 81 mg by mouth daily. What changed:  Another medication with the same name was removed. Continue taking this medication, and follow the directions you see here.   BAYER MICROLET LANCETS lancets USE AS DIRECTED   calcium carbonate 500 MG chewable tablet Commonly known as:  TUMS - dosed in mg elemental calcium Chew 1  tablet (200 mg of elemental calcium total) by mouth 3 (three) times daily with meals.   cloNIDine 0.1 MG  tablet Commonly known as:  CATAPRES Take 1 tablet (0.1 mg total) by mouth 2 (two) times daily.   guaiFENesin 600 MG 12 hr tablet Commonly known as:  MUCINEX Take 2 tablets (1,200 mg total) by mouth 2 (two) times daily.   HYDROcodone-acetaminophen 7.5-325 MG tablet Commonly known as:  NORCO Take 1 tablet by mouth every 6 (six) hours as needed for moderate pain.   levothyroxine 150 MCG tablet Commonly known as:  SYNTHROID, LEVOTHROID Take 1 tablet (150 mcg total) by mouth daily before breakfast. Start taking on:  08/06/2016   metFORMIN 500 MG tablet Commonly known as:  GLUCOPHAGE Take 1 tablet by mouth  every day with largest meal What changed:  how much to take  how to take this  when to take this  additional instructions   promethazine 25 MG suppository Commonly known as:  PHENERGAN Place 1 suppository (25 mg total) rectally every 6 (six) hours as needed for nausea or vomiting.   ramipril 10 MG capsule Commonly known as:  ALTACE TAKE 1 CAPSULE BY MOUTH  ONCE DAILY   senna-docusate 8.6-50 MG tablet Commonly known as:  Senokot-S Take 1 tablet by mouth 2 (two) times daily.         Diet and Activity recommendation: See Discharge Instructions above   Consults obtained -  ENT   Major procedures and Radiology Reports - PLEASE review detailed and final reports for all details, in brief -  Total thyroidectomy, right lobe marked with a suture by Dr. Constance Holster on 6/20   X-ray Chest Pa And Lateral  Result Date: 08/01/2016 CLINICAL DATA:  Shortness of breath. EXAM: CHEST  2 VIEW COMPARISON:  07/31/2016 FINDINGS: Mediastinum and hilar structures normal. Heart size stable. No focal infiltrate. Low lung volumes. No pleural effusion or pneumothorax . Degenerative changes thoracic spine. IMPRESSION: Low lung volumes with mild basilar atelectasis . No acute infiltrate noted. Electronically Signed   By: Marcello Moores  Register   On: 08/01/2016 07:02   Dg Chest 2 View  Result Date:  07/31/2016 CLINICAL DATA:  Sore throat. EXAM: CHEST  2 VIEW COMPARISON:  None. FINDINGS: The heart size and mediastinal contours are within normal limits. Both lungs are clear. The visualized skeletal structures are unremarkable. IMPRESSION: No active cardiopulmonary disease. Electronically Signed   By: Dorise Bullion III M.D   On: 07/31/2016 17:03   Ct Soft Tissue Neck W Contrast  Result Date: 07/31/2016 CLINICAL DATA:  Initial evaluation for anterior neck swelling with stridor. EXAM: CT NECK WITH CONTRAST TECHNIQUE: Multidetector CT imaging of the neck was performed using the standard protocol following the bolus administration of intravenous contrast. CONTRAST:  63mL ISOVUE-300 IOPAMIDOL (ISOVUE-300) INJECTION 61% COMPARISON:  None available. FINDINGS: Pharynx and larynx: Oral cavity within normal limits without loculated fluid collection or mass lesion. No acute abnormality about the dentition. Palatine tonsils symmetric and normal. Parapharyngeal fat preserved. Nasopharynx normal. Epiglottis normal. Vallecula clear. Retropharyngeal soft tissues within normal limits. Remainder of the hypopharynx and supraglottic larynx within normal limits. True cords fairly symmetric and within normal limits. Salivary glands: Salivary glands including the parotid and submandibular glands are normal. Thyroid: Thyroid is markedly enlarged and heterogeneous in appearance, suggesting thyroid goiter. Right lobe of thyroid is larger than the left. Slight substernal extension on the left into the upper mediastinum. Mass effect on the subglottic trachea which is compressed and narrowed, measuring approximately  5 mm in transverse diameter. No significant inflammatory stranding about the thyroid itself to suggest acute thyroiditis. Lymph nodes: No pathologically enlarged lymph nodes identified within the neck. Vascular: Normal intravascular enhancement seen throughout the neck. Carotid artery's are splayed at around the enlarged  thyroid. Limited intracranial: Unremarkable. Visualized orbits: Globes and orbital soft tissues within normal limits. Mastoids and visualized paranasal sinuses: Mild mucosal thickening within the right maxillary sinus. Visualized paranasal sinuses are otherwise clear. Mastoids and middle ear cavities are well pneumatized and clear. Skeleton: No acute osseous abnormality. No worrisome lytic or blastic osseous lesions. Moderate degenerative spondylolysis present at C5-6 and C6-7. Upper chest: Visualized upper chest within normal limits. Partially visualized lungs are clear. Other: Incidental note made of a 17 mm hypodense collection within subcutaneous fat of the right posterior neck, likely a sebaceous cyst. No significant associated inflammatory changes. IMPRESSION: 1. Markedly enlarged heterogeneous thyroid, likely related to thyroid goiter. Secondary mass effect on the subglottic trachea which is narrowed to 5 mm in transverse diameter. 2. No other acute abnormality identified within the neck. Results were discussed by telephone at the time of interpretation on 07/31/2016 at approximately 3:45 p.m. with Dr. Zenovia Jarred. Electronically Signed   By: Jeannine Boga M.D.   On: 07/31/2016 16:02   Dg Chest Port 1 View  Result Date: 08/05/2016 CLINICAL DATA:  Fever EXAM: PORTABLE CHEST 1 VIEW COMPARISON:  08/01/2016 FINDINGS: Heart is borderline in size. Mild vascular congestion and low lung volumes. No confluent opacities, effusions or edema. No acute bony abnormality. IMPRESSION: Low lung volumes.  Vascular congestion. Electronically Signed   By: Rolm Baptise M.D.   On: 08/05/2016 11:04    Micro Results     Recent Results (from the past 240 hour(s))  Culture, blood (Routine X 2) w Reflex to ID Panel     Status: None   Collection Time: 07/31/16  8:01 PM  Result Value Ref Range Status   Specimen Description BLOOD RIGHT HAND  Final   Special Requests   Final    BOTTLES DRAWN AEROBIC AND  ANAEROBIC Blood Culture adequate volume   Culture NO GROWTH 5 DAYS  Final   Report Status 08/05/2016 FINAL  Final  Culture, blood (Routine X 2) w Reflex to ID Panel     Status: None   Collection Time: 07/31/16  8:08 PM  Result Value Ref Range Status   Specimen Description BLOOD LEFT HAND  Final   Special Requests   Final    BOTTLES DRAWN AEROBIC AND ANAEROBIC Blood Culture adequate volume   Culture NO GROWTH 5 DAYS  Final   Report Status 08/05/2016 FINAL  Final  MRSA PCR Screening     Status: None   Collection Time: 08/01/16  3:13 AM  Result Value Ref Range Status   MRSA by PCR NEGATIVE NEGATIVE Final    Comment:        The GeneXpert MRSA Assay (FDA approved for NASAL specimens only), is one component of a comprehensive MRSA colonization surveillance program. It is not intended to diagnose MRSA infection nor to guide or monitor treatment for MRSA infections.   Surgical pcr screen     Status: None   Collection Time: 08/02/16  5:30 PM  Result Value Ref Range Status   MRSA, PCR NEGATIVE NEGATIVE Final   Staphylococcus aureus NEGATIVE NEGATIVE Final    Comment:        The Xpert SA Assay (FDA approved for NASAL specimens in patients over 69 years of age),  is one component of a comprehensive surveillance program.  Test performance has been validated by [facility] for patients greater than or equal to 37 year old. It is not intended to diagnose infection nor to guide or monitor treatment.        Today   Subjective:   Timothy Page today has no headache,no chest or  abdominal pain,no new weakness tingling or numbness, reports some productive cough, feels much better wants to go home today.   Objective:   Blood pressure (!) 143/66, pulse 75, temperature 99.1 F (37.3 C), temperature source Oral, resp. rate (!) 23, height 5\' 11"  (1.803 m), weight 106.1 kg (233 lb 14.5 oz), SpO2 96 %.   Intake/Output Summary (Last 24 hours) at 08/05/16 1603 Last data filed at  08/05/16 0647  Gross per 24 hour  Intake             1800 ml  Output             1175 ml  Net              625 ml    Exam Awake Alert, Oriented x 3, No new F.N deficits, Normal affect Next surgical scar healing nicely, no discharge, JP drain site has closed Symmetrical Chest wall movement, Good air movement bilaterally, CTAB RRR,No Gallops,Rubs or new Murmurs, No Parasternal Heave +ve B.Sounds, Abd Soft, Non tender,No rebound -guarding or rigidity. No Cyanosis, Clubbing or edema, No new Rash or bruise  Data Review   CBC w Diff: Lab Results  Component Value Date   WBC 14.5 (H) 08/05/2016   HGB 13.5 08/05/2016   HCT 39.6 08/05/2016   PLT 271 08/05/2016   LYMPHOPCT 6 07/31/2016   MONOPCT 4 07/31/2016   EOSPCT 0 07/31/2016   BASOPCT 0 07/31/2016    CMP: Lab Results  Component Value Date   NA 138 08/05/2016   K 3.6 08/05/2016   CL 104 08/05/2016   CO2 28 08/05/2016   BUN 10 08/05/2016   CREATININE 1.20 08/05/2016   PROT 6.6 08/01/2016   ALBUMIN 4.0 08/01/2016   BILITOT 1.4 (H) 08/01/2016   ALKPHOS 85 08/01/2016   AST 27 08/01/2016   ALT 16 (L) 08/01/2016  .   Total Time in preparing paper work, data evaluation and todays exam - 35 minutes  Remas Sobel M.D on 08/05/2016 at 4:03 PM  [facility]   Office  [phone]

## 2016-08-05 NOTE — Progress Notes (Signed)
Temp 99.1oral temp.  Patient has used IS 10 x q 1 hr as instructed up to 1200.  States he is ready to go home.

## 2016-08-05 NOTE — Progress Notes (Signed)
Message sent to Dr Trellis Moment with temp 100.2 and green color sputum.

## 2016-08-12 DIAGNOSIS — J3800 Paralysis of vocal cords and larynx, unspecified: Secondary | ICD-10-CM | POA: Insufficient documentation

## 2016-08-12 DIAGNOSIS — E040 Nontoxic diffuse goiter: Secondary | ICD-10-CM | POA: Insufficient documentation

## 2016-08-22 ENCOUNTER — Telehealth (INDEPENDENT_AMBULATORY_CARE_PROVIDER_SITE_OTHER): Payer: Self-pay | Admitting: *Deleted

## 2016-08-22 NOTE — Telephone Encounter (Signed)
Spoke to wife, advised that with a few exceptions ie. Parents of already established patients, Dr. Tobe Sos doesn't take outside adult patients. I also advised they have an appt with Dr. Dwyane Dee on 7/31 at 9am.

## 2016-08-24 ENCOUNTER — Other Ambulatory Visit: Payer: Self-pay | Admitting: Family Medicine

## 2016-08-24 MED ORDER — RAMIPRIL 10 MG PO CAPS: ORAL_CAPSULE | ORAL | 0 refills | Status: DC

## 2016-08-24 NOTE — Telephone Encounter (Signed)
Sure, will send eRx in.

## 2016-08-24 NOTE — Telephone Encounter (Signed)
Patient's wife, Precious Bard, called to schedule NP appt because his PCP, Dr Linna Darner has retired.  The earliest appt was 09/15/16 and patient is going to run out of his BP medications, ramipril.  Can he get a 30 day supply sent to [facility] until he is able to come into establish care.  Please advise.

## 2016-08-24 NOTE — Telephone Encounter (Signed)
Patient's wife aware, she was very [employer].

## 2016-09-11 ENCOUNTER — Encounter: Payer: Self-pay | Admitting: Family Medicine

## 2016-09-11 ENCOUNTER — Other Ambulatory Visit: Payer: Self-pay | Admitting: Family Medicine

## 2016-09-11 DIAGNOSIS — I10 Essential (primary) hypertension: Secondary | ICD-10-CM

## 2016-09-11 MED ORDER — CLONIDINE HCL 0.1 MG PO TABS: 0.1000 mg | ORAL_TABLET | Freq: Two times a day (BID) | ORAL | 0 refills | Status: DC

## 2016-09-11 NOTE — Progress Notes (Signed)
As per pt's written letter request received 09/08/16, I sent in 30 d supply of clonidine to pt's pharmacy until he can get in to see me for new pt/transfer appt.

## 2016-09-12 NOTE — Progress Notes (Signed)
Pts wife advised and voiced understanding.  

## 2016-09-12 NOTE — Progress Notes (Signed)
Left message for pt to call back  °

## 2016-09-13 ENCOUNTER — Ambulatory Visit (INDEPENDENT_AMBULATORY_CARE_PROVIDER_SITE_OTHER): Payer: 59 | Admitting: Endocrinology

## 2016-09-13 ENCOUNTER — Encounter: Payer: Self-pay | Admitting: Endocrinology

## 2016-09-13 ENCOUNTER — Other Ambulatory Visit: Payer: Self-pay

## 2016-09-13 VITALS — BP 135/90 | HR 75 | Ht 69.25 in | Wt 222.6 lb

## 2016-09-13 DIAGNOSIS — R5383 Other fatigue: Secondary | ICD-10-CM | POA: Diagnosis not present

## 2016-09-13 DIAGNOSIS — E1165 Type 2 diabetes mellitus with hyperglycemia: Secondary | ICD-10-CM | POA: Diagnosis not present

## 2016-09-13 DIAGNOSIS — E890 Postprocedural hypothyroidism: Secondary | ICD-10-CM

## 2016-09-13 LAB — BASIC METABOLIC PANEL
BUN: 18 mg/dL (ref 6–23)
CHLORIDE: 107 meq/L (ref 96–112)
CO2: 26 meq/L (ref 19–32)
Calcium: 9.7 mg/dL (ref 8.4–10.5)
Creatinine, Ser: 1.07 mg/dL (ref 0.40–1.50)
GFR: 74.98 mL/min (ref >60.00)
GLUCOSE: 153 mg/dL — AB (ref 70–99)
POTASSIUM: 4.4 meq/L (ref 3.5–5.1)
SODIUM: 141 meq/L (ref 135–145)

## 2016-09-13 LAB — CBC WITH DIFFERENTIAL/PLATELET
BASOS PCT: 1.3 % (ref 0.0–3.0)
Basophils Absolute: 0.1 10*3/uL (ref 0.0–0.1)
EOS ABS: 0 10*3/uL (ref 0.0–0.7)
EOS PCT: 0.8 % (ref 0.0–5.0)
HEMATOCRIT: 44.5 % (ref 39.0–52.0)
HEMOGLOBIN: 15 g/dL (ref 13.0–17.0)
LYMPHS PCT: 26.6 % (ref 12.0–46.0)
Lymphs Abs: 1.6 10*3/uL (ref 0.7–4.0)
MCHC: 33.8 g/dL (ref 30.0–36.0)
MCV: 91.8 fl (ref 78.0–100.0)
MONOS PCT: 4.7 % (ref 3.0–12.0)
Monocytes Absolute: 0.3 10*3/uL (ref 0.1–1.0)
Neutro Abs: 4.1 10*3/uL (ref 1.4–7.7)
Neutrophils Relative %: 66.6 % (ref 43.0–77.0)
Platelets: 218 10*3/uL (ref 150.0–400.0)
RBC: 4.84 Mil/uL (ref 4.22–5.81)
RDW: 13.5 % (ref 11.5–15.5)
WBC: 6.2 10*3/uL (ref 4.0–10.5)

## 2016-09-13 LAB — URINALYSIS, ROUTINE W REFLEX MICROSCOPIC
Bilirubin Urine: NEGATIVE
HGB URINE DIPSTICK: NEGATIVE
KETONES UR: NEGATIVE
LEUKOCYTES UA: NEGATIVE
Nitrite: NEGATIVE
RBC / HPF: NONE SEEN (ref 0–?)
TOTAL PROTEIN, URINE-UPE24: NEGATIVE
URINE GLUCOSE: NEGATIVE
UROBILINOGEN UA: 0.2 (ref 0.0–1.0)
pH: 5 (ref 5.0–8.0)

## 2016-09-13 LAB — MICROALBUMIN / CREATININE URINE RATIO
Creatinine,U: 300.3 mg/dL
MICROALB UR: 1.5 mg/dL (ref 0.0–1.9)
Microalb Creat Ratio: 0.5 mg/g (ref 0.0–30.0)

## 2016-09-13 LAB — TSH: TSH: 0.1 u[IU]/mL — ABNORMAL LOW (ref 0.35–4.50)

## 2016-09-13 LAB — VITAMIN D 25 HYDROXY (VIT D DEFICIENCY, FRACTURES): VITD: 26.39 ng/mL — ABNORMAL LOW (ref 30.00–100.00)

## 2016-09-13 LAB — T4, FREE: Free T4: 1.38 ng/dL (ref 0.60–1.60)

## 2016-09-13 MED ORDER — METFORMIN HCL 500 MG PO TABS: 500.0000 mg | ORAL_TABLET | Freq: Two times a day (BID) | ORAL | 3 refills | Status: DC

## 2016-09-13 MED ORDER — LEVOTHYROXINE SODIUM 137 MCG PO CAPS: 1.0000 | ORAL_CAPSULE | Freq: Every day | ORAL | 3 refills | Status: DC

## 2016-09-13 NOTE — Progress Notes (Signed)
Please call to let patient know that the thyroid level is slightly high.   He can now start taking a new prescription for levothyroxine 137 g daily, please send Blood sugar was 153 Vitamin D slightly low, he can take OTC vitamin D3, 2000 units daily and he can now stop his calcium Labs do not explain his fatigue, to talk to PCP

## 2016-09-13 NOTE — Progress Notes (Signed)
Patient ID: Timothy Page, male   DOB: 11/09/1955, 61 y.o.   MRN: [id number]          Referring physician:Elgergawy, Emeline Gins   Reason for Appointment: Postsurgical hypothyroidism, new consultation    History of Present Illness:   The patient thinks he was told by his PCP that his thyroid was enlarged but no details are available and current medical records His thyroid levels had been normal in the past  Patient thinks that occasionally he would notice his neck to be swelling but this would go away In June 2018 patient noticed significant swelling of his neck along with fever and also had cold symptoms Since he started having difficulty breathing he went to the emergency room and was hospitalized Since he had tracheal compression he had thyroidectomy done by 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  Subsequently the patient has been on levothyroxine 150 g daily He does however thinks he feels more easily fatigued and usually gets exhausted in the early afternoon  He has had difficulty with swallowing water but no food or other drinks   Does not feel like she has any choking sensation currently He has been hoarse since the surgery Does not complain of any tingling in his fingers or face or any twitching or muscle cramping He is only taking regular Tums 3 times a day as calcium supplement, calcium at the time of discharge was 8.1   Lab Results  Component Value Date   TSH 0.506 07/31/2016   TSH 0.76 08/25/2014   TSH 0.90 03/14/2013    DIABETES history:  Patient has had mild diabetes since 2014 when his A1c was 6.5 and highest level of 6.8 in 02/2013 He has been on metformin, currently taking 1 tablet daily He thinks he had previously been prescribed metformin twice a day Has been told by his previous PCP not to check his blood sugars He has had difficulty losing weight until recently, previously usually 235-240 pounds  Has not seen a dietitian He is trying to  be relatively active with playing golf or taking care of his grandson  Recent blood sugars in the hospital stay have been mostly in the mid 100s  Wt Readings from Last 3 Encounters:  09/13/16 222 lb 9.6 oz (101 kg)  08/05/16 233 lb 14.5 oz (106.1 kg)  03/06/15 235 lb (106.6 kg)    Lab Results  Component Value Date   HGBA1C 6.5 (H) 07/31/2016   HGBA1C 6.7 (H) 08/25/2014   HGBA1C 6.8 (H) 03/14/2013   Lab Results  Component Value Date   MICROALBUR 1.4 08/25/2014   LDLCALC 122 (H) 08/01/2016   CREATININE 1.20 08/05/2016     Allergies as of 09/13/2016      Reactions   Tenormin [atenolol]    Severe "slowing"of functioning   Tetracycline    Rash Because of a history of documented adverse serious drug reaction;Medi Alert bracelet  is recommended   Lipitor [atorvastatin]    D/Ced by him due to Myalgias in Sept 2014      Medication List       Accurate as of 09/13/16 10:41 AM. Always use your most recent med list.          ALPRAZolam 0.25 MG tablet Commonly known as:  XANAX Take 1 tablet (0.25 mg total) by mouth 2 (two) times daily as needed for anxiety.   amLODipine 5 MG tablet Commonly known as:  NORVASC Take 1 tablet by mouth  twice a  day   aspirin 81 MG tablet Take 81 mg by mouth daily.   BAYER MICROLET LANCETS lancets USE AS DIRECTED   calcium carbonate 500 MG chewable tablet Commonly known as:  TUMS - dosed in mg elemental calcium Chew 1 tablet (200 mg of elemental calcium total) by mouth 3 (three) times daily with meals.   cloNIDine 0.1 MG tablet Commonly known as:  CATAPRES Take 1 tablet (0.1 mg total) by mouth 2 (two) times daily.   levothyroxine 150 MCG tablet Commonly known as:  SYNTHROID, LEVOTHROID Take 1 tablet (150 mcg total) by mouth daily before breakfast.   metFORMIN 500 MG tablet Commonly known as:  GLUCOPHAGE Take 1 tablet (500 mg total) by mouth 2 (two) times daily with a meal.   ramipril 10 MG capsule Commonly known as:  ALTACE TAKE  1 CAPSULE BY MOUTH  ONCE DAILY   senna-docusate 8.6-50 MG tablet Commonly known as:  Senokot-S Take 1 tablet by mouth 2 (two) times daily.       Allergies:  Allergies  Allergen Reactions  . Tenormin [Atenolol]     Severe "slowing"of functioning  . Tetracycline     Rash Because of a history of documented adverse serious drug reaction;Medi Alert bracelet  is recommended  . Lipitor [Atorvastatin]     D/Ced by him due to Myalgias in Sept 2014    Past Medical History:  Diagnosis Date  . Arthritis   . Diabetes mellitus without complication ([place])   . Heart attack [facility]) 1980   age 68 X 2 over 2 month period  . History of thyroidectomy, total 2018  . Hx of adenomatous polyp of colon 07/02/2009  . Lipoma    back  . Seasonal allergies     Past Surgical History:  Procedure Laterality Date  . CARDIAC CATHETERIZATION  1980  . finger amputaion     with reattachement  . LASIK    . THYROIDECTOMY N/A 08/03/2016   Procedure: Total THYROIDECTOMY;  Surgeon: [name], MD;  Location: [facility] OR;  Service: ENT;  Laterality: N/A;  Total Thyroidectomy     Family History  Problem Relation Age of Onset  . Diabetes Mother   . Cancer Mother        lung  . COPD Mother   . Brain cancer Mother   . Diabetes Father   . Cancer Father        mesothelioma; skin  . Mental illness Father   . Heart disease Maternal Grandmother        MI @ 7  . Heart disease Maternal Grandfather        MI @ 26  . Cancer Paternal Grandmother   . Heart disease Paternal Grandfather        MI @ 94  . Thyroid cancer Maternal Aunt     Social History:  reports that he has never smoked. His smokeless tobacco use includes Snuff. He reports that he does not drink alcohol or use drugs.    Review of Systems  Constitutional: Positive for weight loss.  HENT: Positive for hoarseness and trouble swallowing.   Respiratory: Negative for shortness of breath.   Cardiovascular: Negative for leg swelling.  Gastrointestinal:  Negative for constipation and diarrhea.  Endocrine: Positive for fatigue and cold intolerance.  Genitourinary: Negative for frequency.  Musculoskeletal: Negative for joint pain.  Skin: Negative for dry skin.  Neurological: Negative for numbness and tingling.      Examination:   BP 135/90 (Cuff Size: Normal)  Pulse 75   Ht 5' 9.25" (1.759 m)   Wt 222 lb 9.6 oz (101 kg)   SpO2 96%   BMI 32.64 kg/m    General Appearance: pleasant, Has mild generalized obesity         Eyes: No prominence or swelling of the eyelids Tongue is moist         Neck: The thyroid is nonpalpable.  There is a healed scar of thyroidectomy in the lower neck There is no lymphadenopathy.     Cardiovascular: Normal  heart sounds, no murmur Respiratory:  Lungs clear  Abdomen: No distention, hepatosplenomegaly or other mass  Neurological: REFLEXES: at biceps are normal.  Reflexes are absent at the ankles  Diabetic Foot Exam - Simple   Simple Foot Form Diabetic Foot exam was performed with the following findings:  Yes   Visual Inspection No deformities, no ulcerations, no other skin breakdown bilaterally:  Yes Sensation Testing Intact to touch and monofilament testing bilaterally:  Yes Pulse Check Posterior Tibialis and Dorsalis pulse intact bilaterally:  Yes Comments      Vibration sense at the distal toes is mildly reduced  Skin: no rash      Extremities: No edema, peripheral joints look normal  Assessment/Plan:  History of large Hurthle cell adenoma tracheal compression, treated surgically in 07/2016  Patient is on levothyroxine supplementation with 150 g daily Currently complaining of excessive fatigue and will need to have thyroid levels checked today Further adjustment of doses will be made after labs available His neck exam is unremarkable  Postoperative sequelae include hoarseness from vocal cord dysfunction as well as mild hypocalcemia although asymptomatic  HYPOCALCEMIA: Will recheck  his calcium level today, currently symptomatic.  Also check his vitamin D level  DIABETES with mild obesity and BMI 33 Currently taking only 500 mg of metformin with recent A1c 6.5 He is recently losing weight since surgery and may have better control with continuing to keep his weight down and increase activity Will start him on 500 mg twice a day of metformin and he can follow-up with PCP  HYPERTENSION: His blood pressure is not well controlled with current multiple drugs He will increase his Norvasc to 10 mg daily until seen by PCP this week  FATIGUE: Will check CBC, he will discuss them comes with his PCP on his upcoming visit  There are no Patient Instructions on file for this visit.  [facility] 09/13/2016   Total visit time for evaluation and management of multiple problems, review of labs, hospital records, counseling = 60 minutes Consultation note has been copied to referring physician

## 2016-09-15 ENCOUNTER — Encounter: Payer: Self-pay | Admitting: Family Medicine

## 2016-09-15 ENCOUNTER — Ambulatory Visit (INDEPENDENT_AMBULATORY_CARE_PROVIDER_SITE_OTHER): Payer: 59 | Admitting: Family Medicine

## 2016-09-15 ENCOUNTER — Telehealth: Payer: Self-pay | Admitting: Endocrinology

## 2016-09-15 VITALS — BP 148/104 | HR 86 | Temp 98.2°F | Resp 16 | Ht 69.0 in | Wt 221.5 lb

## 2016-09-15 DIAGNOSIS — E7800 Pure hypercholesterolemia, unspecified: Secondary | ICD-10-CM | POA: Diagnosis not present

## 2016-09-15 DIAGNOSIS — E119 Type 2 diabetes mellitus without complications: Secondary | ICD-10-CM | POA: Diagnosis not present

## 2016-09-15 DIAGNOSIS — I10 Essential (primary) hypertension: Secondary | ICD-10-CM | POA: Diagnosis not present

## 2016-09-15 MED ORDER — RAMIPRIL 10 MG PO CAPS: ORAL_CAPSULE | ORAL | 3 refills | Status: DC

## 2016-09-15 MED ORDER — AMLODIPINE BESYLATE 5 MG PO TABS: 5.0000 mg | ORAL_TABLET | Freq: Two times a day (BID) | ORAL | 3 refills | Status: DC

## 2016-09-15 NOTE — Telephone Encounter (Signed)
Pattie, wife, calling to check on the status of PA for Levothyroxine.  [facility] at [place]   Please advise.  Ty,  -LL

## 2016-09-15 NOTE — Telephone Encounter (Signed)
Routing to you °

## 2016-09-15 NOTE — Patient Instructions (Addendum)
Check your blood pressure and heart rate once daily and write these numbers down so we can review them in the office in 3 weeks.

## 2016-09-15 NOTE — Progress Notes (Signed)
Office Note 09/15/2016  CC:  Chief Complaint  Patient presents with  . Establish Care    transfer from Dr. Linna Darner  . Follow-up    [facility]    HPI:  Timothy Page is a 61 y.o. male who is here to establish care/transfer from Dr. Linna Darner who has retired. Followed by Dr. Dwyane Dee in endocrinology for postop hypothyroidism.  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 Old records in EPIC/[facility] EMR were reviewed prior to or during today's visit.  He is here for f/u DM 2, HTN, and HLD.  No home glucose or bp monitoring lately. Has always had well controlled DM and HTN per his report today. However, he is concerned about his bp not being controlled lately.  He reports that his bp was high in hosp when he got thyroid surgery in June, also mildly elevated in MD visits lately. Recent recheck of TSH was low, so his levothyroxine dose was decreased from 150 mcg to 137 mcg qd.  HLD: he is not on a statin.  He has hx of myalgias on atorvastatin.  We discussed the potential for requiring this med due to his status of having DM 2.  However, he wants to continue to try to lose wt and see if his DM "goes away", before starting any statin.   Past Medical History:  Diagnosis Date  . Arthritis   . Diabetes mellitus without complication ([place])   . History of thyroidectomy, total 2018   Goiter.  [name] Hx of adenomatous polyp of colon 07/02/2009   As of 09/2016, pt is aware he is overdue for repeat colonoscopy---he knows to call [facility] to set this up.  . Hyperlipidemia   . Hypertension   . Lipoma    back  . 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 Seasonal allergies   . Tachyarrhythmia 1980   age 89 X 2 over 25 month period.  No recurrence since that time.  . Vocal cord paralysis 07/2016   Left vocal cord paralysis noted after thyroid surgery.    Past Surgical History:  Procedure Laterality Date  . CARDIAC CATHETERIZATION  1980  . COLONOSCOPY W/ POLYPECTOMY  07/02/2009   tubular adenoma (recall 5  yrs; pt aware he is overdue as of 09/2016 and knows to call  GI to set this up.  . ESOPHAGOGASTRODUODENOSCOPY  07/02/2009   NORMAL  . finger amputaion     with reattachement  . LASIK    . THYROIDECTOMY N/A 08/03/2016   Procedure: Total THYROIDECTOMY;  Surgeon: [name], MD;  Location: [facility] OR;  Service: ENT;  Laterality: N/A;  Total Thyroidectomy     Family History  Problem Relation Age of Onset  . Diabetes Mother   . Cancer Mother        lung  . COPD Mother   . Brain cancer Mother   . Arthritis Mother   . Hyperlipidemia Mother   . Heart disease Mother   . Hypertension Mother   . Diabetes Father   . Cancer Father        mesothelioma; skin  . Mental illness Father   . Arthritis Father   . Hyperlipidemia Father   . Heart disease Father   . Hypertension Father   . Heart disease Maternal Grandmother        MI @ 65  . Heart disease Maternal Grandfather        MI @ 35  . Cancer Paternal Grandmother   . Diabetes Paternal Grandmother   .  Heart disease Paternal Grandfather        MI @ 63  . Thyroid cancer Maternal Aunt     Social History   Social History  . Marital status: Married    Spouse name: N/A  . Number of children: N/A  . Years of education: N/A   Occupational History  . Not on file.   Social History Main Topics  . Smoking status: Never Smoker  . Smokeless tobacco: Current User    Types: Snuff  . Alcohol use No  . Drug use: No  . Sexual activity: Not on file   Other Topics Concern  . Not on file   Social History Narrative   Married, 2 daughters, 1 grandson that lives with him.   Educ: some college   Occup: retired from the [employer].   No T/A/Ds.    Outpatient Encounter Prescriptions as of 09/15/2016  Medication Sig  . ALPRAZolam (XANAX) 0.25 MG tablet Take 1 tablet (0.25 mg total) by mouth 2 (two) times daily as needed for anxiety.  [name] amLODipine (NORVASC) 5 MG tablet Take 1 tablet (5 mg total) by mouth 2 (two) times daily.  [name] aspirin  81 MG tablet Take 81 mg by mouth daily.  . cloNIDine (CATAPRES) 0.1 MG tablet Take 1 tablet (0.1 mg total) by mouth 2 (two) times daily.  . metFORMIN (GLUCOPHAGE) 500 MG tablet Take 1 tablet (500 mg total) by mouth 2 (two) times daily with a meal.  . ramipril (ALTACE) 10 MG capsule 1 cap po bid  . senna-docusate (SENOKOT-S) 8.6-50 MG tablet Take 1 tablet by mouth 2 (two) times daily.  . [DISCONTINUED] amLODipine (NORVASC) 5 MG tablet Take 1 tablet by mouth  twice a day (Patient taking differently: Take 5 mg by mouth 2 (two) times daily. )  . [DISCONTINUED] ramipril (ALTACE) 10 MG capsule TAKE 1 CAPSULE BY MOUTH  ONCE DAILY  . Cholecalciferol (VITAMIN D3) 2000 units TABS Take 1 capsule by mouth daily.  . Levothyroxine Sodium 137 MCG CAPS Take 1 capsule (137 mcg total) by mouth daily before breakfast. (Patient not taking: Reported on 09/15/2016)  . [DISCONTINUED] BAYER MICROLET LANCETS lancets USE AS DIRECTED (Patient not taking: Reported on 09/13/2016)  . [DISCONTINUED] calcium carbonate (TUMS - DOSED IN MG ELEMENTAL CALCIUM) 500 MG chewable tablet Chew 1 tablet (200 mg of elemental calcium total) by mouth 3 (three) times daily with meals. (Patient not taking: Reported on 09/15/2016)   No facility-administered encounter medications on file as of 09/15/2016.     Allergies  Allergen Reactions  . Tenormin [Atenolol]     Severe "slowing"of functioning  . Tetracycline     Rash Because of a history of documented adverse serious drug reaction;Medi Alert bracelet  is recommended  . Lipitor [Atorvastatin]     D/Ced by him due to Myalgias in Sept 2014  . Guaifenesin     Other reaction(s): Other (See Comments) unknown    ROS Review of Systems  Constitutional: Negative for fatigue and fever.  HENT: Negative for congestion and sore throat.   Eyes: Negative for visual disturbance.  Respiratory: Negative for cough.   Cardiovascular: Negative for chest pain.  Gastrointestinal: Negative for abdominal pain  and nausea.  Genitourinary: Negative for dysuria.  Musculoskeletal: Negative for back pain and joint swelling.  Skin: Negative for rash.  Neurological: Negative for weakness and headaches.  Hematological: Negative for adenopathy.    PE; Blood pressure (!) 148/104, pulse 86, temperature 98.2 F (36.8 C), temperature source  Oral, resp. rate 16, height 5\' 9"  (1.753 m), weight 221 lb 8 oz (100.5 kg), SpO2 97 %. Body mass index is 32.71 kg/m.  Gen: Alert, well appearing.  Patient is oriented to person, place, time, and situation. AFFECT: pleasant, lucid thought and speech. OXB:DZHG: no injection, icteris, swelling, or exudate.  EOMI, PERRLA. Mouth: lips without lesion/swelling.  Oral mucosa pink and moist. Oropharynx without erythema, exudate, or swelling.  CV: RRR, no m/r/g.   LUNGS: CTA bilat, nonlabored resps, good aeration in all lung fields. EXT: no clubbing or cyanosis.  Trace pitting edema in both LL's  Pertinent labs:  Lab Results  Component Value Date   TSH 0.10 (L) 09/13/2016   Lab Results  Component Value Date   WBC 6.2 09/13/2016   HGB 15.0 09/13/2016   HCT 44.5 09/13/2016   MCV 91.8 09/13/2016   PLT 218.0 09/13/2016   Lab Results  Component Value Date   CREATININE 1.07 09/13/2016   BUN 18 09/13/2016   NA 141 09/13/2016   K 4.4 09/13/2016   CL 107 09/13/2016   CO2 26 09/13/2016   Lab Results  Component Value Date   ALT 16 (L) 08/01/2016   AST 27 08/01/2016   ALKPHOS 85 08/01/2016   BILITOT 1.4 (H) 08/01/2016   Lab Results  Component Value Date   CHOL 177 08/01/2016   Lab Results  Component Value Date   HDL 37 (L) 08/01/2016   Lab Results  Component Value Date   LDLCALC 122 (H) 08/01/2016   Lab Results  Component Value Date   TRIG 88 08/01/2016   Lab Results  Component Value Date   CHOLHDL 4.8 08/01/2016   Lab Results  Component Value Date   PSA 1.03 05/05/2009   Lab Results  Component Value Date   HGBA1C 6.5 (H) 07/31/2016     ASSESSMENT AND PLAN:   New pt/transfer pt--no request for prior records needed.  1) DM 2, well controlled. Due for HbA1c recheck after 10/31/16. Urine microalb/cr was normal at Dr. Ronnie Derby just a few days ago. Feet exam was done by Dr. Dwyane Dee 09/13/16 as well. Pt is overdue for diab retpthy screening exam.  2) HTN: not controlled.  We do have to take his current hyperthyroid state into account, but I still feel like increasing ramipril to 20mg  qd is appropriate. Lytes/cr good on 09/13/16. Instructions: Check your blood pressure and heart rate once daily and write these numbers down so we can review them in the office in 3 weeks.  3) Hyperlipidemia: LDL goal <70 given his dx of DM 2.  Discussed CV dz prevention role of statins in diabetics, regardless of cholesterol levels.  He expressed understanding, but declines to start this med currently b/c he wants to continue to try to get his glucoses so good that he is no longer in diabetic range.  If this does not happen, we'll re-evaluate starting statin.  He was agreeable to this plan today.  An After Visit Summary was printed and given to the patient.  Return in about 3 months (around 12/16/2016) for 3-4 weeks f/u HTN.  He also wants me to check the "lumps" on his back at next visit.  Signed:  Crissie Sickles, MD           09/15/2016

## 2016-09-16 NOTE — Telephone Encounter (Signed)
Precious Bard called to check the status of the note below. Also that she spoke with the pharmacy who advised that a PA is needed for the capsules however, the tablets are accepted by [facility] insurance. Call patient's wife to advise.

## 2016-09-19 ENCOUNTER — Other Ambulatory Visit: Payer: Self-pay

## 2016-09-19 MED ORDER — LEVOTHYROXINE SODIUM 137 MCG PO TABS: 137.0000 ug | ORAL_TABLET | Freq: Every day | ORAL | 3 refills | Status: DC

## 2016-09-19 NOTE — Telephone Encounter (Signed)
Patient's wife returning call.  -ty,  -LL

## 2016-09-19 NOTE — Telephone Encounter (Signed)
Left a vm for his wife to call me back - changed the patient's prescription from capsules to tablets

## 2016-09-19 NOTE — Telephone Encounter (Signed)
Left vm requesting patient's wife call me back

## 2016-09-20 NOTE — Telephone Encounter (Signed)
Called and LVM advising that the RX was changed.  Call back if any questions. Left call back number.

## 2016-09-30 ENCOUNTER — Other Ambulatory Visit: Payer: Self-pay | Admitting: Family Medicine

## 2016-09-30 DIAGNOSIS — I10 Essential (primary) hypertension: Secondary | ICD-10-CM

## 2016-10-03 ENCOUNTER — Encounter: Payer: Self-pay | Admitting: Family Medicine

## 2016-10-03 ENCOUNTER — Ambulatory Visit (INDEPENDENT_AMBULATORY_CARE_PROVIDER_SITE_OTHER): Payer: 59 | Admitting: Family Medicine

## 2016-10-03 VITALS — BP 144/88 | HR 69 | Temp 97.9°F | Resp 16 | Ht 69.0 in | Wt 221.0 lb

## 2016-10-03 DIAGNOSIS — I10 Essential (primary) hypertension: Secondary | ICD-10-CM

## 2016-10-03 MED ORDER — CLONIDINE HCL 0.1 MG PO TABS: ORAL_TABLET | ORAL | 1 refills | Status: DC

## 2016-10-03 NOTE — Telephone Encounter (Signed)
[facility] [place]

## 2016-10-03 NOTE — Progress Notes (Signed)
OFFICE VISIT  10/03/2016   CC:  Chief Complaint  Patient presents with  . Follow-up    HTN   HPI:    Patient is a 62 y.o.  male who presents for f/u 3 week HTN. Last visit I increased ramipril to 20mg  qd.  No adverse side effect from this.  Home bp's avg 150s/80s to the best of his recollection.  Highest systolic 696.    Past Medical History:  Diagnosis Date  . Arthritis   . Diabetes mellitus without complication ([place])   . History of thyroidectomy, total 2018   Goiter.  [name] Hx of adenomatous polyp of colon 07/02/2009   As of 09/2016, pt is aware he is overdue for repeat colonoscopy---he knows to call [facility] to set this up.  . Hyperlipidemia   . Hypertension   . Lipoma    back  . 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 Seasonal allergies   . Tachyarrhythmia 1980   age 59 X 2 over 29 month period.  No recurrence since that time.  . Vocal cord paralysis 07/2016   Left vocal cord paralysis noted after thyroid surgery.    Past Surgical History:  Procedure Laterality Date  . CARDIAC CATHETERIZATION  1980  . COLONOSCOPY W/ POLYPECTOMY  07/02/2009   tubular adenoma (recall 5 yrs; pt aware he is overdue as of 09/2016 and knows to call [facility] to set this up.  . ESOPHAGOGASTRODUODENOSCOPY  07/02/2009   NORMAL  . finger amputaion     with reattachement  . LASIK    . THYROIDECTOMY N/A 08/03/2016   Procedure: Total THYROIDECTOMY;  Surgeon: [name], MD;  Location: [facility];  Service: ENT;  Laterality: N/A;  Total Thyroidectomy     Outpatient Medications Prior to Visit  Medication Sig Dispense Refill  . ALPRAZolam (XANAX) 0.25 MG tablet Take 1 tablet (0.25 mg total) by mouth 2 (two) times daily as needed for anxiety. 10 tablet 0  . amLODipine (NORVASC) 5 MG tablet Take 1 tablet (5 mg total) by mouth 2 (two) times daily. 180 tablet 3  . aspirin 81 MG tablet Take 81 mg by mouth daily.    . Cholecalciferol (VITAMIN D3) 2000 units TABS Take 1 capsule by mouth daily.    [name]  levothyroxine (SYNTHROID, LEVOTHROID) 137 MCG tablet Take 1 tablet (137 mcg total) by mouth daily before breakfast. 30 tablet 3  . metFORMIN (GLUCOPHAGE) 500 MG tablet Take 1 tablet (500 mg total) by mouth 2 (two) times daily with a meal. 180 tablet 3  . ramipril (ALTACE) 10 MG capsule 1 cap po bid (Patient taking differently: Take 10 mg by mouth 2 (two) times daily. ) 180 capsule 3  . senna-docusate (SENOKOT-S) 8.6-50 MG tablet Take 1 tablet by mouth 2 (two) times daily. 60 tablet 0  . cloNIDine (CATAPRES) 0.1 MG tablet TAKE 1 TABLET BY MOUTH TWICE DAILY 60 tablet 1  . cloNIDine (CATAPRES) 0.1 MG tablet Take 1 tablet (0.1 mg total) by mouth 2 (two) times daily. 30 tablet 0   No facility-administered medications prior to visit.     Allergies  Allergen Reactions  . Tenormin [Atenolol]     Severe "slowing"of functioning  . Tetracycline     Rash Because of a history of documented adverse serious drug reaction;Medi Alert bracelet  is recommended  . Lipitor [Atorvastatin]     D/Ced by him due to Myalgias in Sept 2014  . Guaifenesin     Other reaction(s): Other (See Comments) unknown  ROS As per HPI  [id number] bp 144/88 Blood pressure (!) 152/89, pulse 69, temperature 97.9 F (36.6 C), temperature source Oral, resp. rate 16, height 5\' 9"  (1.753 m), weight 221 lb (100.2 kg), SpO2 96 %. Gen: Alert, well appearing.  Patient is oriented to person, place, time, and situation. AFFECT: pleasant, lucid thought and speech. No further exam note.  LABS:  Lab Results  Component Value Date   TSH 0.10 (L) 09/13/2016   Lab Results  Component Value Date   WBC 6.2 09/13/2016   HGB 15.0 09/13/2016   HCT 44.5 09/13/2016   MCV 91.8 09/13/2016   PLT 218.0 09/13/2016   Lab Results  Component Value Date   CREATININE 1.07 09/13/2016   BUN 18 09/13/2016   NA 141 09/13/2016   K 4.4 09/13/2016   CL 107 09/13/2016   CO2 26 09/13/2016   Lab Results  Component Value Date   ALT 16 (L)  08/01/2016   AST 27 08/01/2016   ALKPHOS 85 08/01/2016   BILITOT 1.4 (H) 08/01/2016   Lab Results  Component Value Date   CHOL 177 08/01/2016   Lab Results  Component Value Date   HDL 37 (L) 08/01/2016   Lab Results  Component Value Date   LDLCALC 122 (H) 08/01/2016   Lab Results  Component Value Date   TRIG 88 08/01/2016   Lab Results  Component Value Date   CHOLHDL 4.8 08/01/2016   Lab Results  Component Value Date   PSA 1.03 05/05/2009   Lab Results  Component Value Date   HGBA1C 6.5 (H) 07/31/2016    IMPRESSION AND PLAN:  Uncontrolled HTN. Increase clonidine to 0.2mg  bid. Continue other bp meds at current doses. Monitor home bp's and bring numbers to next f/u visit for review with me.  An After Visit Summary was printed and given to the patient.  FOLLOW UP: Return for 3 week f/u HTN.  Signed:  Crissie Sickles, MD           10/03/2016

## 2016-10-25 ENCOUNTER — Ambulatory Visit (INDEPENDENT_AMBULATORY_CARE_PROVIDER_SITE_OTHER): Payer: 59 | Admitting: Family Medicine

## 2016-10-25 ENCOUNTER — Other Ambulatory Visit: Payer: Self-pay | Admitting: Family Medicine

## 2016-10-25 ENCOUNTER — Encounter: Payer: Self-pay | Admitting: Family Medicine

## 2016-10-25 VITALS — BP 134/79 | HR 59 | Temp 98.0°F | Resp 16 | Ht 69.0 in | Wt 224.8 lb

## 2016-10-25 DIAGNOSIS — S56812A Strain of other muscles, fascia and tendons at forearm level, left arm, initial encounter: Secondary | ICD-10-CM | POA: Diagnosis not present

## 2016-10-25 DIAGNOSIS — I10 Essential (primary) hypertension: Secondary | ICD-10-CM

## 2016-10-25 MED ORDER — CLONIDINE HCL 0.3 MG PO TABS: 0.3000 mg | ORAL_TABLET | Freq: Two times a day (BID) | ORAL | 1 refills | Status: DC

## 2016-10-25 NOTE — Progress Notes (Signed)
OFFICE VISIT  10/25/2016   CC:  Chief Complaint  Patient presents with  . Follow-up    HTN   HPI:    Patient is a 61 y.o.  male who presents for 3 week f/u uncontrolled HTN. Last visit I increased his clonidine to 0.2 mg bid.  Home bp's occ near normal (140s/80s, some 150s/90s, some 180s/90s).  He associates his high bp's with taking care of a wild grandson.  Says the bp meds make him tired.  No CP, no SOB, no diaphoresis, no palpitations, no dizziness.  Played golf recently and at the end of his round he felt left arm tingling/numbness from distal triceps area down lateral forearm into wrist.  No weakness.  Some soreness with certain movements of left forearm.   Past Medical History:  Diagnosis Date  . Arthritis   . Diabetes mellitus without complication ([place])   . History of thyroidectomy, total 2018   Goiter.  [name] Hx of adenomatous polyp of colon 07/02/2009   As of 09/2016, pt is aware he is overdue for repeat colonoscopy---he knows to call [facility] to set this up.  . Hyperlipidemia   . Hypertension   . Lipoma    back  . 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 Seasonal allergies   . Tachyarrhythmia 1980   age 27 X 2 over 31 month period.  No recurrence since that time.  . Vocal cord paralysis 07/2016   Left vocal cord paralysis noted after thyroid surgery.    Past Surgical History:  Procedure Laterality Date  . CARDIAC CATHETERIZATION  1980  . COLONOSCOPY W/ POLYPECTOMY  07/02/2009   tubular adenoma (recall 5 yrs; pt aware he is overdue as of 09/2016 and knows to call [facility] to set this up.  . ESOPHAGOGASTRODUODENOSCOPY  07/02/2009   NORMAL  . finger amputaion     with reattachement  . LASIK    . THYROIDECTOMY N/A 08/03/2016   Procedure: Total THYROIDECTOMY;  Surgeon: [name], MD;  Location: [facility];  Service: ENT;  Laterality: N/A;  Total Thyroidectomy     Outpatient Medications Prior to Visit  Medication Sig Dispense Refill  . ALPRAZolam (XANAX) 0.25 MG  tablet Take 1 tablet (0.25 mg total) by mouth 2 (two) times daily as needed for anxiety. 10 tablet 0  . amLODipine (NORVASC) 5 MG tablet Take 1 tablet (5 mg total) by mouth 2 (two) times daily. 180 tablet 3  . aspirin 81 MG tablet Take 81 mg by mouth daily.    . Cholecalciferol (VITAMIN D3) 2000 units TABS Take 1 capsule by mouth daily.    [name] levothyroxine (SYNTHROID, LEVOTHROID) 137 MCG tablet Take 1 tablet (137 mcg total) by mouth daily before breakfast. 30 tablet 3  . metFORMIN (GLUCOPHAGE) 500 MG tablet Take 1 tablet (500 mg total) by mouth 2 (two) times daily with a meal. 180 tablet 3  . ramipril (ALTACE) 10 MG capsule 1 cap po bid (Patient taking differently: Take 10 mg by mouth 2 (two) times daily. ) 180 capsule 3  . senna-docusate (SENOKOT-S) 8.6-50 MG tablet Take 1 tablet by mouth 2 (two) times daily. 60 tablet 0  . cloNIDine (CATAPRES) 0.1 MG tablet 2 tabs po bid 120 tablet 1   No facility-administered medications prior to visit.     Allergies  Allergen Reactions  . Tenormin [Atenolol]     Severe "slowing"of functioning  . Tetracycline     Rash Because of a history of documented adverse serious drug reaction;Medi Alert  bracelet  is recommended  . Lipitor [Atorvastatin]     D/Ced by him due to Myalgias in Sept 2014  . Guaifenesin     Other reaction(s): Other (See Comments) unknown    ROS As per HPI  PE: Blood pressure 134/79, pulse (!) 59, temperature 98 F (36.7 C), temperature source Oral, resp. rate 16, height 5\' 9"  (1.753 m), weight 224 lb 12 oz (101.9 kg), SpO2 98 %. Gen: Alert, well appearing.  Patient is oriented to person, place, time, and situation. AFFECT: pleasant, lucid thought and speech. CV: RRR, no m/r/g.   LUNGS: CTA bilat, nonlabored resps, good aeration in all lung fields. EXT: no clubbing, cyanosis, or edema.  Left arm: sensation normal, strength 5/5 prox/dist.  Mild pain in L forearm with resisted pronation.   LABS:    Chemistry      Component  Value Date/Time   NA 141 09/13/2016 0946   K 4.4 09/13/2016 0946   CL 107 09/13/2016 0946   CO2 26 09/13/2016 0946   BUN 18 09/13/2016 0946   CREATININE 1.07 09/13/2016 0946      Component Value Date/Time   CALCIUM 9.7 09/13/2016 0946   ALKPHOS 85 08/01/2016 0531   AST 27 08/01/2016 0531   ALT 16 (L) 08/01/2016 0531   BILITOT 1.4 (H) 08/01/2016 0531      IMPRESSION AND PLAN:  1) Uncontrolled HTN; control is improving, but is quite affected by life stress (taking care of a 60 yr old boy). Will increase clonidine to 0.3mg  bid and continue ramipril 20 mg qd as well as amlodipine 5mg  bid.  2) Left arm muscular strain: reassured.  RICE.  An After Visit Summary was printed and given to the patient.  FOLLOW UP: Return in about 4 weeks (around 11/22/2016) for annual CPE (fasting).  Signed:  Crissie Sickles, MD           10/25/2016

## 2016-10-26 ENCOUNTER — Other Ambulatory Visit: Payer: Self-pay | Admitting: *Deleted

## 2016-10-26 MED ORDER — LEVOTHYROXINE SODIUM 137 MCG PO TABS: 137.0000 ug | ORAL_TABLET | Freq: Every day | ORAL | 0 refills | Status: DC

## 2016-10-31 ENCOUNTER — Other Ambulatory Visit: Payer: Self-pay | Admitting: *Deleted

## 2016-10-31 MED ORDER — METFORMIN HCL 500 MG PO TABS
500.0000 mg | ORAL_TABLET | Freq: Two times a day (BID) | ORAL | 1 refills | Status: DC
Start: 2016-10-31 — End: 2017-04-12

## 2016-10-31 NOTE — Telephone Encounter (Signed)
Refill request from [facility] for metformin.

## 2016-11-11 ENCOUNTER — Other Ambulatory Visit (INDEPENDENT_AMBULATORY_CARE_PROVIDER_SITE_OTHER): Payer: 59

## 2016-11-11 DIAGNOSIS — E890 Postprocedural hypothyroidism: Secondary | ICD-10-CM | POA: Diagnosis not present

## 2016-11-11 LAB — TSH: TSH: 0.7 u[IU]/mL (ref 0.35–4.50)

## 2016-11-11 LAB — T4, FREE: Free T4: 1.24 ng/dL (ref 0.60–1.60)

## 2016-11-14 ENCOUNTER — Encounter: Payer: Self-pay | Admitting: Endocrinology

## 2016-11-14 ENCOUNTER — Ambulatory Visit (INDEPENDENT_AMBULATORY_CARE_PROVIDER_SITE_OTHER): Payer: 59 | Admitting: Endocrinology

## 2016-11-14 VITALS — BP 110/84 | HR 75 | Ht 69.0 in | Wt 222.0 lb

## 2016-11-14 DIAGNOSIS — E890 Postprocedural hypothyroidism: Secondary | ICD-10-CM | POA: Diagnosis not present

## 2016-11-14 NOTE — Progress Notes (Signed)
Patient ID: Timothy Page, male   DOB: February 03, 1956, 61 y.o.   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  Patient was discharged on 150 g of levothyroxine However since TSH was 0.1 this was reduced down to 137 g on his initial consultation in 7/18 He does however thinks he feels fatigued as before and usually gets exhausted in the early afternoon  History complains of cold intolerance  Recently no difficulty swallowing His hoarseness has still not improved, he is due to see his surgeon again in follow-up this month   Lab Results  Component Value Date   FREET4 1.24 11/11/2016   FREET4 1.38 09/13/2016   TSH 0.70 11/11/2016   TSH 0.10 (L) 09/13/2016   TSH 0.506 07/31/2016   PARATHYROID:  His calcium level on his follow-up after surgery was normal and his calcium supplements were stopped However because of vitamin D deficiency was told to take vitamin D3 2000 units daily which he is doing  DIABETES history: The following is a copy of the previous note  Patient has had mild diabetes since 2014 when his A1c was 6.5 and highest level of 6.8 in 02/2013 He has been on metformin, currently taking 1 tablet daily He thinks he had previously been prescribed metformin twice a day Has been told by his previous PCP not to check his blood sugars He has had difficulty losing weight until recently, previously usually 235-240 pounds  Has not seen a dietitian He is trying to be  relatively active with playing golf or taking care of his grandson   Wt Readings from Last 3 Encounters:  11/14/16 222 lb (100.7 kg)  10/25/16 224 lb 12 oz (101.9 kg)  10/03/16 221 lb (100.2 kg)    Lab Results  Component Value Date   HGBA1C 6.5 (H) 07/31/2016   HGBA1C 6.7 (H) 08/25/2014   HGBA1C 6.8 (H) 03/14/2013   Lab Results  Component Value Date   MICROALBUR 1.5 09/13/2016   LDLCALC 122 (H) 08/01/2016   CREATININE 1.07 09/13/2016     Allergies as of 11/14/2016      Reactions   Tenormin [atenolol]    Severe "slowing"of functioning   Tetracycline    Rash Because of a history of documented adverse serious drug reaction;Medi Alert bracelet  is recommended   Lipitor [atorvastatin]    D/Ced by him due to Myalgias in Sept 2014   Guaifenesin    Other reaction(s): Other (See Comments) unknown      Medication List       Accurate as of 11/14/16  9:46 AM. Always use your most recent med list.          ALPRAZolam 0.25 MG tablet Commonly known as:  XANAX Take 1 tablet (0.25 mg total) by mouth 2 (two) times daily as needed for anxiety.   amLODipine 5 MG tablet Commonly known as:  NORVASC Take 1 tablet (5 mg total) by mouth 2 (two) times daily.   aspirin 81 MG  tablet Take 81 mg by mouth daily.   cloNIDine 0.3 MG tablet Commonly known as:  CATAPRES Take 1 tablet (0.3 mg total) by mouth 2 (two) times daily.   levothyroxine 137 MCG tablet Commonly known as:  SYNTHROID, LEVOTHROID Take 1 tablet (137 mcg total) by mouth daily before breakfast.   metFORMIN 500 MG tablet Commonly known as:  GLUCOPHAGE Take 1 tablet (500 mg total) by mouth 2 (two) times daily with a meal.   ramipril 10 MG capsule Commonly known as:  ALTACE 1 cap po bid   senna-docusate 8.6-50 MG tablet Commonly known as:  Senokot-S Take 1 tablet by mouth 2 (two) times daily.   Vitamin D3 2000 units Tabs Take 1 capsule by mouth daily.       Allergies:  Allergies  Allergen Reactions  .  Tenormin [Atenolol]     Severe "slowing"of functioning  . Tetracycline     Rash Because of a history of documented adverse serious drug reaction;Medi Alert bracelet  is recommended  . Lipitor [Atorvastatin]     D/Ced by him due to Myalgias in Sept 2014  . Guaifenesin     Other reaction(s): Other (See Comments) unknown    Past Medical History:  Diagnosis Date  . Arthritis   . Diabetes mellitus without complication ([place])   . History of thyroidectomy, total 2018   Goiter.  [name] Hx of adenomatous polyp of colon 07/02/2009   As of 09/2016, pt is aware he is overdue for repeat colonoscopy---he knows to call [facility] to set this up.  . Hyperlipidemia   . Hypertension   . Lipoma    back  . 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 Seasonal allergies   . Tachyarrhythmia 1980   age 17 X 2 over 37 month period.  No recurrence since that time.  . Vocal cord paralysis 07/2016   Left vocal cord paralysis noted after thyroid surgery.    Past Surgical History:  Procedure Laterality Date  . CARDIAC CATHETERIZATION  1980  . COLONOSCOPY W/ POLYPECTOMY  07/02/2009   tubular adenoma (recall 5 yrs; pt aware he is overdue as of 09/2016 and knows to call [facility] to set this up.  . ESOPHAGOGASTRODUODENOSCOPY  07/02/2009   NORMAL  . finger amputaion     with reattachement  . LASIK    . THYROIDECTOMY N/A 08/03/2016   Procedure: Total THYROIDECTOMY;  Surgeon: [name], MD;  Location: [facility] OR;  Service: ENT;  Laterality: N/A;  Total Thyroidectomy     Family History  Problem Relation Age of Onset  . Diabetes Mother   . Cancer Mother        lung  . COPD Mother   . Brain cancer Mother   . Arthritis Mother   . Hyperlipidemia Mother   . Heart disease Mother   . Hypertension Mother   . Diabetes Father   . Cancer Father        mesothelioma; skin  . Mental illness Father   . Arthritis Father   . Hyperlipidemia Father   . Heart disease Father   . Hypertension Father   . Heart disease  Maternal Grandmother        MI @ 74  . Heart disease Maternal Grandfather        MI @ 39  . Cancer Paternal Grandmother   . Diabetes Paternal Grandmother   . Heart disease Paternal Grandfather        MI @ 80  . Thyroid cancer Maternal Aunt  Social History:  reports that he has never smoked. His smokeless tobacco use includes Snuff. He reports that he does not drink alcohol or use drugs.    Review of Systems    Examination:   BP 110/84   Pulse 75   Ht 5\' 9"  (1.753 m)   Wt 222 lb (100.7 kg)   SpO2 96%   BMI 32.78 kg/m     Assessment/Plan:  History of large Hurthle cell adenoma tracheal compression, treated surgically in 07/2016  Patient is on levothyroxine supplementation with 137 g in the last 2 months With this his TSH is back to normal Most likely his fatigue is unrelated to hypothyroidism He is compliant with his regimen in the morning and he will continue Again reminded him not to take any calcium or iron-containing vitamins at the same time  HYPOCALCEMIA: This has not been an issue He is mildly vitamin D deficient and is taking supplements which he will need to do long-term Will recheck his calcium level today, currently symptomatic.  Also check his vitamin D level  DIABETES with mild obesity  Discussed that he needs to follow-up with PCP, probably needs to start home monitoring and also have a A1c done next month  FATIGUE: He will discuss with PCP  There are no Patient Instructions on file for this visit.  [facility] 11/14/2016

## 2016-12-05 ENCOUNTER — Encounter: Payer: Self-pay | Admitting: Family Medicine

## 2016-12-06 ENCOUNTER — Encounter: Payer: 59 | Admitting: Family Medicine

## 2016-12-15 ENCOUNTER — Ambulatory Visit (INDEPENDENT_AMBULATORY_CARE_PROVIDER_SITE_OTHER): Payer: 59 | Admitting: Family Medicine

## 2016-12-15 ENCOUNTER — Encounter: Payer: Self-pay | Admitting: Family Medicine

## 2016-12-15 VITALS — BP 121/75 | HR 65 | Temp 98.5°F | Resp 16 | Ht 69.0 in | Wt 223.5 lb

## 2016-12-15 DIAGNOSIS — Z0000 Encounter for general adult medical examination without abnormal findings: Secondary | ICD-10-CM | POA: Diagnosis not present

## 2016-12-15 DIAGNOSIS — E119 Type 2 diabetes mellitus without complications: Secondary | ICD-10-CM | POA: Diagnosis not present

## 2016-12-15 DIAGNOSIS — Z125 Encounter for screening for malignant neoplasm of prostate: Secondary | ICD-10-CM | POA: Diagnosis not present

## 2016-12-15 DIAGNOSIS — Z23 Encounter for immunization: Secondary | ICD-10-CM

## 2016-12-15 DIAGNOSIS — E7800 Pure hypercholesterolemia, unspecified: Secondary | ICD-10-CM

## 2016-12-15 DIAGNOSIS — I10 Essential (primary) hypertension: Secondary | ICD-10-CM

## 2016-12-15 LAB — BASIC METABOLIC PANEL
BUN: 19 mg/dL (ref 6–23)
CALCIUM: 10 mg/dL (ref 8.4–10.5)
CO2: 28 mEq/L (ref 19–32)
Chloride: 105 mEq/L (ref 96–112)
Creatinine, Ser: 1.15 mg/dL (ref 0.40–1.50)
GFR: 68.94 mL/min (ref >60.00)
GLUCOSE: 124 mg/dL — AB (ref 70–99)
POTASSIUM: 4.9 meq/L (ref 3.5–5.1)
Sodium: 141 mEq/L (ref 135–145)

## 2016-12-15 LAB — LIPID PANEL
CHOL/HDL RATIO: 6
Cholesterol: 211 mg/dL — ABNORMAL HIGH (ref 0–200)
HDL: 37.3 mg/dL — AB (ref >39.00)
LDL CALC: 150 mg/dL — AB (ref 0–99)
NONHDL: 173.87
TRIGLYCERIDES: 119 mg/dL (ref 0.0–149.0)
VLDL: 23.8 mg/dL (ref 0.0–40.0)

## 2016-12-15 LAB — HEMOGLOBIN A1C: HEMOGLOBIN A1C: 6.2 % (ref 4.6–6.5)

## 2016-12-15 LAB — PSA: PSA: 1.14 ng/mL (ref 0.10–4.00)

## 2016-12-15 NOTE — Progress Notes (Signed)
Office Note 12/15/2016  CC:  Chief Complaint  Patient presents with  . Annual Exam    Pt is fasting.     HPI:  Timothy Page is a 61 y.o.  male who is here for annual health maintenance exam. Sees Dr. Dwyane Dee for recent post-surgical hypothyroidism.  TSH now normal.  Eyes: vision works--exam 03/2016. Dental: not UTD. Exercise: plays golf. Diet: avoids simple sugars.  No added salt.  Past Medical History:  Diagnosis Date  . Arthritis   . Diabetes mellitus without complication ([place])   . History of thyroidectomy, total 2018   Goiter.  [name] Hx of adenomatous polyp of colon 07/02/2009   As of 09/2016, pt is aware he is overdue for repeat colonoscopy---he knows to call [facility] to set this up.  . Hyperlipidemia   . Hypertension   . Lipoma    back  . 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  . Seasonal allergies   . Tachyarrhythmia 1980   age 82 X 2 over 57 month period.  No recurrence since that time.  . Vocal cord paralysis 07/2016   Left vocal cord paralysis noted after thyroid surgery.    Past Surgical History:  Procedure Laterality Date  . CARDIAC CATHETERIZATION  1980  . COLONOSCOPY W/ POLYPECTOMY  07/02/2009   tubular adenoma (recall 5 yrs; pt aware he is overdue as of 09/2016 and knows to call [facility] to set this up.  . ESOPHAGOGASTRODUODENOSCOPY  07/02/2009   NORMAL  . finger amputaion     with reattachement  . LASIK    . THYROIDECTOMY N/A 08/03/2016   Procedure: Total THYROIDECTOMY;  Surgeon: [name], MD;  Location: [facility] OR;  Service: ENT;  Laterality: N/A;  Total Thyroidectomy     Family History  Problem Relation Age of Onset  . Diabetes Mother   . Cancer Mother        lung  . COPD Mother   . Brain cancer Mother   . Arthritis Mother   . Hyperlipidemia Mother   . Heart disease Mother   . Hypertension Mother   . Diabetes Father   . Cancer Father        mesothelioma; skin  . Mental illness Father   . Arthritis Father    . Hyperlipidemia Father   . Heart disease Father   . Hypertension Father   . Heart disease Maternal Grandmother        MI @ 42  . Heart disease Maternal Grandfather        MI @ 76  . Cancer Paternal Grandmother   . Diabetes Paternal Grandmother   . Heart disease Paternal Grandfather        MI @ 51  . Thyroid cancer Maternal Aunt     Social History   Social History  . Marital status: Married    Spouse name: N/A  . Number of children: N/A  . Years of education: N/A   Occupational History  . Not on file.   Social History Main Topics  . Smoking status: Never Smoker  . Smokeless tobacco: Current User    Types: Snuff  . Alcohol use No  . Drug use: No  . Sexual activity: Not on file   Other Topics Concern  . Not on file   Social History Narrative   Married, 2 daughters, 1 grandson that lives with him.   Educ: some college   Occup: retired from the [employer].   No T/A/Ds.  Outpatient Medications Prior to Visit  Medication Sig Dispense Refill  . ALPRAZolam (XANAX) 0.25 MG tablet Take 1 tablet (0.25 mg total) by mouth 2 (two) times daily as needed for anxiety. 10 tablet 0  . amLODipine (NORVASC) 5 MG tablet Take 1 tablet (5 mg total) by mouth 2 (two) times daily. 180 tablet 3  . aspirin 81 MG tablet Take 81 mg by mouth daily.    . Cholecalciferol (VITAMIN D3) 2000 units TABS Take 1 capsule by mouth daily.    . cloNIDine (CATAPRES) 0.3 MG tablet Take 1 tablet (0.3 mg total) by mouth 2 (two) times daily. 60 tablet 1  . levothyroxine (SYNTHROID, LEVOTHROID) 137 MCG tablet Take 1 tablet (137 mcg total) by mouth daily before breakfast. 90 tablet 0  . metFORMIN (GLUCOPHAGE) 500 MG tablet Take 1 tablet (500 mg total) by mouth 2 (two) times daily with a meal. 180 tablet 1  . ramipril (ALTACE) 10 MG capsule 1 cap po bid (Patient taking differently: Take 10 mg by mouth 2 (two) times daily. ) 180 capsule 3  . senna-docusate (SENOKOT-S) 8.6-50 MG tablet Take 1 tablet by mouth  2 (two) times daily. 60 tablet 0   No facility-administered medications prior to visit.     Allergies  Allergen Reactions  . Tenormin [Atenolol]     Severe "slowing"of functioning  . Tetracycline     Rash Because of a history of documented adverse serious drug reaction;Medi Alert bracelet  is recommended  . Lipitor [Atorvastatin]     D/Ced by him due to Myalgias in Sept 2014  . Guaifenesin     Other reaction(s): Other (See Comments) unknown    ROS Review of Systems  Constitutional: Negative for appetite change, chills, fatigue and fever.  HENT: Negative for congestion, dental problem, ear pain and sore throat.   Eyes: Negative for discharge, redness and visual disturbance.  Respiratory: Negative for cough, chest tightness, shortness of breath and wheezing.   Cardiovascular: Negative for chest pain, palpitations and leg swelling.  Gastrointestinal: Negative for abdominal pain, blood in stool, diarrhea, nausea and vomiting.  Genitourinary: Negative for difficulty urinating, dysuria, flank pain, frequency, hematuria and urgency.  Musculoskeletal: Negative for arthralgias, back pain, joint swelling, myalgias and neck stiffness.       Intermittent, mild pain/paresthesia/? Weakness in both arms---subacute   Skin: Negative for pallor and rash.  Neurological: Negative for dizziness, speech difficulty, weakness and headaches.  Hematological: Negative for adenopathy. Does not bruise/bleed easily.  Psychiatric/Behavioral: Negative for confusion and sleep disturbance. The patient is not nervous/anxious.     PE; Blood pressure 121/75, pulse 65, temperature 98.5 F (36.9 C), temperature source Oral, resp. rate 16, height 5\' 9"  (1.753 m), weight 223 lb 8 oz (101.4 kg), SpO2 95 %. Body mass index is 33.01 kg/m.  Gen: Alert, well appearing.  Patient is oriented to person, place, time, and situation. AFFECT: pleasant, lucid thought and speech. ENT: Ears: EACs clear, normal epithelium.  TMs  with good light reflex and landmarks bilaterally.  Eyes: no injection, icteris, swelling, or exudate.  EOMI, PERRLA. Nose: no drainage or turbinate edema/swelling.  No injection or focal lesion.  Mouth: lips without lesion/swelling.  Oral mucosa pink and moist.  Dentition intact and without obvious caries or gingival swelling.  Oropharynx without erythema, exudate, or swelling.  Neck: supple/nontender.  No LAD, mass, or TM.  Carotid pulses 2+ bilaterally, without bruits. CV: RRR, no m/r/g.   LUNGS: CTA bilat, nonlabored resps, good aeration in all lung  fields. ABD: soft, NT, ND, BS normal.  No hepatospenomegaly or mass.  No bruits. EXT: no clubbing, cyanosis, or edema.  Musculoskeletal: no joint swelling, erythema, warmth, or tenderness.  ROM of all joints intact. Skin - no sores or suspicious lesions or rashes or color changes Rectal exam: negative without mass, lesions or tenderness, PROSTATE EXAM: smooth and symmetric without nodules or tenderness.   Pertinent labs:  Lab Results  Component Value Date   TSH 0.70 11/11/2016   Lab Results  Component Value Date   WBC 6.2 09/13/2016   HGB 15.0 09/13/2016   HCT 44.5 09/13/2016   MCV 91.8 09/13/2016   PLT 218.0 09/13/2016   Lab Results  Component Value Date   CREATININE 1.07 09/13/2016   BUN 18 09/13/2016   NA 141 09/13/2016   K 4.4 09/13/2016   CL 107 09/13/2016   CO2 26 09/13/2016   Lab Results  Component Value Date   ALT 16 (L) 08/01/2016   AST 27 08/01/2016   ALKPHOS 85 08/01/2016   BILITOT 1.4 (H) 08/01/2016   Lab Results  Component Value Date   CHOL 177 08/01/2016   Lab Results  Component Value Date   HDL 37 (L) 08/01/2016   Lab Results  Component Value Date   LDLCALC 122 (H) 08/01/2016   Lab Results  Component Value Date   TRIG 88 08/01/2016   Lab Results  Component Value Date   CHOLHDL 4.8 08/01/2016   Lab Results  Component Value Date   PSA 1.03 05/05/2009   Lab Results  Component Value Date    HGBA1C 6.5 (H) 07/31/2016    ASSESSMENT AND PLAN:   Health maintenance exam: Reviewed age and gender appropriate health maintenance issues (prudent diet, regular exercise, health risks of tobacco and excessive alcohol, use of seatbelts, fire alarms in home, use of sunscreen).  Also reviewed age and gender appropriate health screening as well as vaccine recommendations. Vaccines: pneumovax 23 and flu given today.  Shingrix discussed. Labs: BMET, A1c, FLP, PSA. Prostate ca screening: DRE normal today , PSA. Colon ca screening/hx of polyps: pt overdue for colonoscopy as of 06/2014. He has already known of this and knows to call [facility] to set this up.  An After Visit Summary was printed and given to the patient.  FOLLOW UP:  Return in about 3 months (around 03/17/2017) for routine chronic illness f/u.  Pt can make appt at his convenience to further address his arm/shoulde.  Signed:  Crissie Sickles, MD           12/15/2016

## 2016-12-15 NOTE — Patient Instructions (Addendum)
To help with muscle cramps:   Take one over the counter Magnesium oxide tab 500 mg once daily.  Drink 3 oz of tonic water both morning and evening.   Health Maintenance, Male A healthy lifestyle and preventive care is important for your health and wellness. Ask your health care provider about what schedule of regular examinations is right for you. What should I know about weight and diet? Eat a Healthy Diet  Eat plenty of vegetables, fruits, whole grains, low-fat dairy products, and lean protein.  Do not eat a lot of foods high in solid fats, added sugars, or salt.  Maintain a Healthy Weight Regular exercise can help you achieve or maintain a healthy weight. You should:  Do at least 150 minutes of exercise each week. The exercise should increase your heart rate and make you sweat (moderate-intensity exercise).  Do strength-training exercises at least twice a week.  Watch Your Levels of Cholesterol and Blood Lipids  Have your blood tested for lipids and cholesterol every 5 years starting at 60 years of age. If you are at high risk for heart disease, you should start having your blood tested when you are 60 years old. You may need to have your cholesterol levels checked more often if: ? Your lipid or cholesterol levels are high. ? You are older than 60 years of age. ? You are at high risk for heart disease.  What should I know about cancer screening? Many types of cancers can be detected early and may often be prevented. Lung Cancer  You should be screened every year for lung cancer if: ? You are a current smoker who has smoked for at least 30 years. ? You are a former smoker who has quit within the past 15 years.  Talk to your health care provider about your screening options, when you should start screening, and how often you should be screened.  Colorectal Cancer  Routine colorectal cancer screening usually begins at 60 years of age and should be repeated every 5-10  years until you are 60 years old. You may need to be screened more often if early forms of precancerous polyps or small growths are found. Your health care provider may recommend screening at an earlier age if you have risk factors for colon cancer.  Your health care provider may recommend using home test kits to check for hidden blood in the stool.  A small camera at the end of a tube can be used to examine your colon (sigmoidoscopy or colonoscopy). This checks for the earliest forms of colorectal cancer.  Prostate and Testicular Cancer  Depending on your age and overall health, your health care provider may do certain tests to screen for prostate and testicular cancer.  Talk to your health care provider about any symptoms or concerns you have about testicular or prostate cancer.  Skin Cancer  Check your skin from head to toe regularly.  Tell your health care provider about any new moles or changes in moles, especially if: ? There is a change in a mole's size, shape, or color. ? You have a mole that is larger than a pencil eraser.  Always use sunscreen. Apply sunscreen liberally and repeat throughout the day.  Protect yourself by wearing long sleeves, pants, a wide-brimmed hat, and sunglasses when outside.  What should I know about heart disease, diabetes, and high blood pressure?  If you are 61-30 years of age, have your blood pressure checked every 3-5 years. If you are  80 years of age or older, have your blood pressure checked every year. You should have your blood pressure measured twice-once when you are at a hospital or clinic, and once when you are not at a hospital or clinic. Record the average of the two measurements. To check your blood pressure when you are not at a hospital or clinic, you can use: ? An automated blood pressure machine at a pharmacy. ? A home blood pressure monitor.  Talk to your health care provider about your target blood pressure.  If you are between  78-63 years old, ask your health care provider if you should take aspirin to prevent heart disease.  Have regular diabetes screenings by checking your fasting blood sugar level. ? If you are at a normal weight and have a low risk for diabetes, have this test once every three years after the age of 75. ? If you are overweight and have a high risk for diabetes, consider being tested at a younger age or more often.  A one-time screening for abdominal aortic aneurysm (AAA) by ultrasound is recommended for men aged 77-75 years who are current or former smokers. What should I know about preventing infection? Hepatitis B If you have a higher risk for hepatitis B, you should be screened for this virus. Talk with your health care provider to find out if you are at risk for hepatitis B infection. Hepatitis C Blood testing is recommended for:  Everyone born from [birth year] through 1964.  Anyone with known risk factors for hepatitis C.  Sexually Transmitted Diseases (STDs)  You should be screened each year for STDs including gonorrhea and chlamydia if: ? You are sexually active and are younger than 60 years of age. ? You are older than 60 years of age and your health care provider tells you that you are at risk for this type of infection. ? Your sexual activity has changed since you were last screened and you are at an increased risk for chlamydia or gonorrhea. Ask your health care provider if you are at risk.  Talk with your health care provider about whether you are at high risk of being infected with HIV. Your health care provider may recommend a prescription medicine to help prevent HIV infection.  What else can I do?  Schedule regular health, dental, and eye exams.  Stay current with your vaccines (immunizations).  Do not use any tobacco products, such as cigarettes, chewing tobacco, and e-cigarettes. If you need help quitting, ask your health care provider.  Limit alcohol intake to no more than  2 drinks per day. One drink equals 12 ounces of beer, 5 ounces of wine, or 1 ounces of hard liquor.  Do not use street drugs.  Do not share needles.  Ask your health care provider for help if you need support or information about quitting drugs.  Tell your health care provider if you often feel depressed.  Tell your health care provider if you have ever been abused or do not feel safe at home. This information is not intended to replace advice given to you by your health care provider. Make sure you discuss any questions you have with your health care provider. Document Released: 07/30/2007 Document Revised: 09/30/2015 Document Reviewed: 11/04/2014 Elsevier Interactive Patient Education  [employer].

## 2016-12-15 NOTE — Addendum Note (Signed)
Addended by: Onalee Hua on: 12/15/2016 09:04 AM   Modules accepted: Orders

## 2016-12-16 ENCOUNTER — Other Ambulatory Visit: Payer: Self-pay | Admitting: *Deleted

## 2016-12-16 ENCOUNTER — Encounter: Payer: Self-pay | Admitting: *Deleted

## 2016-12-16 DIAGNOSIS — E785 Hyperlipidemia, unspecified: Secondary | ICD-10-CM

## 2016-12-16 MED ORDER — CLONIDINE HCL 0.3 MG PO TABS: 0.3000 mg | ORAL_TABLET | Freq: Two times a day (BID) | ORAL | 1 refills | Status: DC

## 2016-12-16 MED ORDER — ROSUVASTATIN CALCIUM 10 MG PO TABS: 10.0000 mg | ORAL_TABLET | Freq: Every day | ORAL | 2 refills | Status: DC

## 2017-01-06 ENCOUNTER — Other Ambulatory Visit: Payer: Self-pay | Admitting: Endocrinology

## 2017-03-17 ENCOUNTER — Ambulatory Visit: Payer: 59 | Admitting: Family Medicine

## 2017-04-12 ENCOUNTER — Other Ambulatory Visit: Payer: Self-pay | Admitting: Family Medicine

## 2017-04-12 ENCOUNTER — Other Ambulatory Visit: Payer: Self-pay | Admitting: Endocrinology

## 2017-04-14 DIAGNOSIS — R0789 Other chest pain: Secondary | ICD-10-CM

## 2017-04-14 HISTORY — DX: Other chest pain: R07.89

## 2017-04-21 ENCOUNTER — Ambulatory Visit (INDEPENDENT_AMBULATORY_CARE_PROVIDER_SITE_OTHER): Payer: 59 | Admitting: Family Medicine

## 2017-04-21 ENCOUNTER — Encounter: Payer: Self-pay | Admitting: Family Medicine

## 2017-04-21 VITALS — BP 111/71 | HR 63 | Temp 98.1°F | Resp 16 | Ht 69.0 in | Wt 233.5 lb

## 2017-04-21 DIAGNOSIS — J180 Bronchopneumonia, unspecified organism: Secondary | ICD-10-CM | POA: Diagnosis not present

## 2017-04-21 DIAGNOSIS — E119 Type 2 diabetes mellitus without complications: Secondary | ICD-10-CM | POA: Diagnosis not present

## 2017-04-21 DIAGNOSIS — I10 Essential (primary) hypertension: Secondary | ICD-10-CM | POA: Diagnosis not present

## 2017-04-21 DIAGNOSIS — E7800 Pure hypercholesterolemia, unspecified: Secondary | ICD-10-CM

## 2017-04-21 LAB — LIPID PANEL
Cholesterol: 192 mg/dL (ref 0–200)
HDL: 37.1 mg/dL — ABNORMAL LOW (ref >39.00)
NONHDL: 155.21
Total CHOL/HDL Ratio: 5
Triglycerides: 211 mg/dL — ABNORMAL HIGH (ref 0.0–149.0)
VLDL: 42.2 mg/dL — ABNORMAL HIGH (ref 0.0–40.0)

## 2017-04-21 LAB — LDL CHOLESTEROL, DIRECT: Direct LDL: 123 mg/dL

## 2017-04-21 LAB — HEMOGLOBIN A1C: Hgb A1c MFr Bld: 6.9 % — ABNORMAL HIGH (ref 4.6–6.5)

## 2017-04-21 LAB — BASIC METABOLIC PANEL
BUN: 11 mg/dL (ref 6–23)
CO2: 27 meq/L (ref 19–32)
Calcium: 9.8 mg/dL (ref 8.4–10.5)
Chloride: 105 mEq/L (ref 96–112)
Creatinine, Ser: 1.04 mg/dL (ref 0.40–1.50)
GFR: 77.33 mL/min (ref >60.00)
Glucose, Bld: 128 mg/dL — ABNORMAL HIGH (ref 70–99)
POTASSIUM: 4.3 meq/L (ref 3.5–5.1)
SODIUM: 140 meq/L (ref 135–145)

## 2017-04-21 MED ORDER — AZITHROMYCIN 250 MG PO TABS: ORAL_TABLET | ORAL | 0 refills | Status: DC

## 2017-04-21 MED ORDER — PREDNISONE 20 MG PO TABS: ORAL_TABLET | ORAL | 0 refills | Status: DC

## 2017-04-21 NOTE — Progress Notes (Signed)
OFFICE VISIT  04/21/2017   CC:  Chief Complaint  Patient presents with  . Cough   HPI:    Patient is a 62 y.o. Caucasian male who presents for respiratory symptoms, + wants to f/u chronic med problems. Onset 3 weeks ago, nasal cong/runny nose, cough and fatigue, lots of mucous.  Theraflu was tried, no help. Feels right ear pain lately---no hearing problems, no drainage.  Sometimes wheezing, esp at night.  Thick nasal and chest mucous production per pt.    HLD: took only 4 dose of crestor that I rx'd 4 mo ago b/c of signif musculoskeletal pain.  HTN: home bp monitoring "good" but pt can't recall #s.  DM2: no home gluc monitoring but he states he has not felt like glucoses were high or low.  ROS: no CP, no SOB, no palpitations, no rash, no dizziness.  +Dry mouth.  Past Medical History:  Diagnosis Date  . Arthritis   . Diabetes mellitus without complication ([place])   . History of thyroidectomy, total 2018   Goiter.  [name] Hx of adenomatous polyp of colon 07/02/2009   As of 09/2016, pt is aware he is overdue for repeat colonoscopy---he knows to call [facility] to set this up.  . Hyperlipidemia   . Hypertension   . Lipoma    back  . 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  . Seasonal allergies   . Tachyarrhythmia 1980   age 62 X 2 over 69 month period.  No recurrence since that time.  . Vocal cord paralysis 07/2016   Left vocal cord paralysis noted after thyroid surgery.    Past Surgical History:  Procedure Laterality Date  . CARDIAC CATHETERIZATION  1980  . COLONOSCOPY W/ POLYPECTOMY  07/02/2009   tubular adenoma (recall 5 yrs; pt aware he is overdue as of 09/2016 and knows to call [facility] to set this up.  . ESOPHAGOGASTRODUODENOSCOPY  07/02/2009   NORMAL  . finger amputaion     with reattachement  . LASIK    . THYROIDECTOMY N/A 08/03/2016   Procedure: Total THYROIDECTOMY;  Surgeon: [name], MD;  Location: [facility];  Service: ENT;  Laterality:  N/A;  Total Thyroidectomy     Outpatient Medications Prior to Visit  Medication Sig Dispense Refill  . ALPRAZolam (XANAX) 0.25 MG tablet Take 1 tablet (0.25 mg total) by mouth 2 (two) times daily as needed for anxiety. 10 tablet 0  . amLODipine (NORVASC) 5 MG tablet Take 1 tablet (5 mg total) by mouth 2 (two) times daily. 180 tablet 3  . aspirin 81 MG tablet Take 81 mg by mouth daily.    . cholecalciferol (VITAMIN D) 1000 units tablet Take 1,000 Units by mouth daily.    . cloNIDine (CATAPRES) 0.3 MG tablet Take 1 tablet (0.3 mg total) by mouth 2 (two) times daily. 180 tablet 1  . levothyroxine (SYNTHROID, LEVOTHROID) 137 MCG tablet TAKE 1 TABLET BY MOUTH  DAILY BEFORE BREAKFAST 90 tablet 0  . metFORMIN (GLUCOPHAGE) 500 MG tablet TAKE 1 TABLET BY MOUTH TWO  TIMES DAILY WITH A MEAL 180 tablet 0  . ramipril (ALTACE) 10 MG capsule 1 cap po bid (Patient taking differently: Take 10 mg by mouth 2 (two) times daily. ) 180 capsule 3  . senna-docusate (SENOKOT-S) 8.6-50 MG tablet Take 1 tablet by mouth 2 (two) times daily. 60 tablet 0  . Cholecalciferol (VITAMIN D3) 2000 units TABS Take 1 capsule by mouth daily.    . rosuvastatin (  CRESTOR) 10 MG tablet Take 1 tablet (10 mg total) by mouth daily. (Patient not taking: Reported on 04/21/2017) 30 tablet 2   No facility-administered medications prior to visit.     Allergies  Allergen Reactions  . Tenormin [Atenolol]     Severe "slowing"of functioning  . Tetracycline     Rash Because of a history of documented adverse serious drug reaction;Medi Alert bracelet  is recommended  . Lipitor [Atorvastatin]     D/Ced by him due to Myalgias in Sept 2014  . Guaifenesin     Other reaction(s): Other (See Comments) unknown    ROS As per HPI  PE: Blood pressure 111/71, pulse 63, temperature 98.1 F (36.7 C), temperature source Oral, resp. rate 16, height 5\' 9"  (1.753 m), weight 233 lb 8 oz (105.9 kg), SpO2 97 %. VS: noted--normal. Gen: alert, NAD, NONTOXIC  APPEARING. HEENT: eyes without injection, drainage, or swelling.  Ears: EACs clear, TMs with normal light reflex and landmarks.  Nose: Clear rhinorrhea, with some dried, crusty exudate adherent to mildly injected mucosa.  No purulent d/c.  No paranasal sinus TTP.  No facial swelling.  Throat and mouth without focal lesion.  No pharyngial swelling, erythema, or exudate.   Neck: supple, no LAD.   LUNGS: CTA bilat, nonlabored resps.   CV: RRR, no m/r/g. EXT: no c/c/e SKIN: no rash   LABS:    Chemistry      Component Value Date/Time   NA 141 12/15/2016 0903   K 4.9 12/15/2016 0903   CL 105 12/15/2016 0903   CO2 28 12/15/2016 0903   BUN 19 12/15/2016 0903   CREATININE 1.15 12/15/2016 0903      Component Value Date/Time   CALCIUM 10.0 12/15/2016 0903   ALKPHOS 85 08/01/2016 0531   AST 27 08/01/2016 0531   ALT 16 (L) 08/01/2016 0531   BILITOT 1.4 (H) 08/01/2016 0531     GFR on 12/15/16= 69 ml/min Lab Results  Component Value Date   HGBA1C 6.2 12/15/2016   Lab Results  Component Value Date   CHOL 211 (H) 12/15/2016   HDL 37.30 (L) 12/15/2016   LDLCALC 150 (H) 12/15/2016   LDLDIRECT 111.4 03/14/2013   TRIG 119.0 12/15/2016   CHOLHDL 6 12/15/2016   Lab Results  Component Value Date   TSH 0.70 11/11/2016    IMPRESSION AND PLAN:  1) Acute bronchopneumonia. Azithromycin x 5d. Prednisone 40mg  qd x 5d, then 20 mg po qd x 5d. Saline nasal spray encouraged.  2) DM 2: no home monitoring. Compliant with metformin.   HbA1c today.  3) HTN: stable.  Lynnea Maizes Lytes/cr today.  4) Intolerant of multiple statins: recheck FLP today. Consider trial of zetia.  An After Visit Summary was printed and given to the patient.  FOLLOW UP: Return in about 4 months (around 08/21/2017) for routine chronic illness f/u.  Signed:  Crissie Sickles, MD           04/21/2017

## 2017-04-23 ENCOUNTER — Encounter: Payer: Self-pay | Admitting: Family Medicine

## 2017-04-25 ENCOUNTER — Other Ambulatory Visit: Payer: Self-pay | Admitting: Family Medicine

## 2017-04-25 MED ORDER — EZETIMIBE 10 MG PO TABS: 10.0000 mg | ORAL_TABLET | Freq: Every day | ORAL | 2 refills | Status: DC

## 2017-05-03 ENCOUNTER — Emergency Department (HOSPITAL_COMMUNITY): Payer: 59

## 2017-05-03 ENCOUNTER — Emergency Department (HOSPITAL_COMMUNITY)
Admission: EM | Admit: 2017-05-03 | Discharge: 2017-05-03 | Disposition: A | Discharge status: Home / Self Care | Payer: 59 | Attending: Emergency Medicine | Admitting: Emergency Medicine

## 2017-05-03 ENCOUNTER — Encounter (HOSPITAL_COMMUNITY): Payer: Self-pay | Admitting: Emergency Medicine

## 2017-05-03 DIAGNOSIS — Z7982 Long term (current) use of aspirin: Secondary | ICD-10-CM | POA: Diagnosis not present

## 2017-05-03 DIAGNOSIS — Z7984 Long term (current) use of oral hypoglycemic drugs: Secondary | ICD-10-CM | POA: Insufficient documentation

## 2017-05-03 DIAGNOSIS — R51 Headache: Secondary | ICD-10-CM | POA: Diagnosis present

## 2017-05-03 DIAGNOSIS — I2510 Atherosclerotic heart disease of native coronary artery without angina pectoris: Secondary | ICD-10-CM | POA: Diagnosis not present

## 2017-05-03 DIAGNOSIS — J111 Influenza due to unidentified influenza virus with other respiratory manifestations: Secondary | ICD-10-CM | POA: Diagnosis not present

## 2017-05-03 DIAGNOSIS — R69 Illness, unspecified: Secondary | ICD-10-CM

## 2017-05-03 DIAGNOSIS — F17220 Nicotine dependence, chewing tobacco, uncomplicated: Secondary | ICD-10-CM | POA: Diagnosis not present

## 2017-05-03 DIAGNOSIS — Z79899 Other long term (current) drug therapy: Secondary | ICD-10-CM | POA: Insufficient documentation

## 2017-05-03 DIAGNOSIS — I10 Essential (primary) hypertension: Secondary | ICD-10-CM | POA: Insufficient documentation

## 2017-05-03 DIAGNOSIS — E119 Type 2 diabetes mellitus without complications: Secondary | ICD-10-CM | POA: Insufficient documentation

## 2017-05-03 LAB — CBC WITH DIFFERENTIAL/PLATELET
BASOS PCT: 0 %
Basophils Absolute: 0 10*3/uL (ref 0.0–0.1)
Eosinophils Absolute: 0.2 10*3/uL (ref 0.0–0.7)
Eosinophils Relative: 2 %
HEMATOCRIT: 41.2 % (ref 39.0–52.0)
HEMOGLOBIN: 14.5 g/dL (ref 13.0–17.0)
LYMPHS ABS: 0.4 10*3/uL — AB (ref 0.7–4.0)
Lymphocytes Relative: 4 %
MCH: 31.7 pg (ref 26.0–34.0)
MCHC: 35.2 g/dL (ref 30.0–36.0)
MCV: 90.2 fL (ref 78.0–100.0)
MONO ABS: 0.6 10*3/uL (ref 0.1–1.0)
MONOS PCT: 6 %
NEUTROS ABS: 8.8 10*3/uL — AB (ref 1.7–7.7)
NEUTROS PCT: 88 %
Platelets: 188 10*3/uL (ref 150–400)
RBC: 4.57 MIL/uL (ref 4.22–5.81)
RDW: 13.5 % (ref 11.5–15.5)
WBC: 9.9 10*3/uL (ref 4.0–10.5)

## 2017-05-03 LAB — COMPREHENSIVE METABOLIC PANEL
ALBUMIN: 4.1 g/dL (ref 3.5–5.0)
ALT: 21 U/L (ref 17–63)
ANION GAP: 9 (ref 5–15)
AST: 21 U/L (ref 15–41)
Alkaline Phosphatase: 64 U/L (ref 38–126)
BUN: 8 mg/dL (ref 6–20)
CHLORIDE: 105 mmol/L (ref 101–111)
CO2: 25 mmol/L (ref 22–32)
Calcium: 8.9 mg/dL (ref 8.9–10.3)
Creatinine, Ser: 1.12 mg/dL (ref 0.61–1.24)
GFR calc non Af Amer: >60 mL/min (ref >60)
GLUCOSE: 147 mg/dL — AB (ref 65–99)
Potassium: 4 mmol/L (ref 3.5–5.1)
SODIUM: 139 mmol/L (ref 135–145)
Total Bilirubin: 0.8 mg/dL (ref 0.3–1.2)
Total Protein: 6.7 g/dL (ref 6.5–8.1)

## 2017-05-03 LAB — URINALYSIS, ROUTINE W REFLEX MICROSCOPIC
BACTERIA UA: NONE SEEN
BILIRUBIN URINE: NEGATIVE
Glucose, UA: NEGATIVE mg/dL
Ketones, ur: NEGATIVE mg/dL
Leukocytes, UA: NEGATIVE
Nitrite: NEGATIVE
PH: 6 (ref 5.0–8.0)
PROTEIN: NEGATIVE mg/dL
Specific Gravity, Urine: 1.008 (ref 1.005–1.030)

## 2017-05-03 LAB — I-STAT CG4 LACTIC ACID, ED: Lactic Acid, Venous: 1.48 mmol/L (ref 0.5–1.9)

## 2017-05-03 MED ORDER — SODIUM CHLORIDE 0.9 % IV BOLUS (SEPSIS)
1000.0000 mL | Freq: Once | INTRAVENOUS | Status: AC
Start: 2017-05-03 — End: 2017-05-03
  Administered 2017-05-03: 1000 mL via INTRAVENOUS

## 2017-05-03 MED ORDER — SODIUM CHLORIDE 0.9 % IV BOLUS (SEPSIS)
1000.0000 mL | Freq: Once | INTRAVENOUS | Status: DC
Start: 2017-05-03 — End: 2017-05-04

## 2017-05-03 MED ORDER — BENZONATATE 100 MG PO CAPS: 100.0000 mg | ORAL_CAPSULE | Freq: Three times a day (TID) | ORAL | 0 refills | Status: DC

## 2017-05-03 MED ORDER — IBUPROFEN 800 MG PO TABS: 800.0000 mg | ORAL_TABLET | Freq: Three times a day (TID) | ORAL | 0 refills | Status: DC

## 2017-05-03 MED ORDER — IBUPROFEN 400 MG PO TABS
600.0000 mg | ORAL_TABLET | Freq: Once | ORAL | Status: AC
  Administered 2017-05-03: 600 mg via ORAL
  Filled 2017-05-03: qty 1

## 2017-05-03 MED ORDER — IPRATROPIUM-ALBUTEROL 0.5-2.5 (3) MG/3ML IN SOLN: 3.0000 mL | Freq: Once | RESPIRATORY_TRACT | Status: DC

## 2017-05-03 MED ORDER — MORPHINE SULFATE (PF) 4 MG/ML IV SOLN
4.0000 mg | Freq: Once | INTRAVENOUS | Status: AC
  Administered 2017-05-03: 4 mg via INTRAVENOUS
  Filled 2017-05-03: qty 1

## 2017-05-03 NOTE — ED Notes (Signed)
Pt discharged from ED; instructions provided and scripts given; Pt encouraged to return to ED if symptoms worsen and to f/u with PCP; Pt verbalized understanding of all instructions 

## 2017-05-03 NOTE — ED Provider Notes (Addendum)
[facility] Provider Note   CSN: [id number] Arrival date & time: 05/03/17  1820     History   Chief Complaint Chief Complaint  Patient presents with  . Hypertension  . Headache    HPI Timothy Page is a 62 y.o. male with history of hypertension, hyperlipidemia, diabetes who presents the emergency department today for fever. Patient reports that he was seen by his PCP on 04/21/2017 where he was diagnosed with bronchopneumonia.  Reports at this time he had 3 weeks of nasal congestion, runny nose, productive cough and not feeling well.  Patient was discharged home on 5-day course of 500 mg azithromycin as well as a 10-day taper of prednisone.  He reports that his symptoms started to improve but over the last 2 days he has felt that his nasal congestion, runny nose and cough have returned.  He reports his cough is nonproductive and without hemoptysis.  He reports he also has a pain behind the right ear over his mastoid that is very tender to touch.  He reports a he reports a fever of 100.4 that started yesterday.  He was transported via EMS and found to have temperature of of 104 he received 1000 mg of Tylenol.  Patient also not requiring oxygen to maintain above 95% O2.  He reports he has never required oxygen in the past.  He does use smokeless tobacco but does not smoke. The patient denies HA, visual changes, hearing changes, tinnitus, ear drainage, barotrauma, sore throat, neck stiffness, rash, chest pain, shortness of breath, abdominal pain, nausea/vomiting/diarrhea, lower leg swelling.  He does report that he has had increased urinary frequency today.  No painful bowel movements.  No testicular pain or swelling reported.  He does report sick contacts including family that have had similar.  He reports he did receive the influenza vaccine this year.  HPI  Past Medical History:  Diagnosis Date  . Arthritis   . Diabetes mellitus without complication ([place])    . History of thyroidectomy, total 2018   Goiter.  [name] Hx of adenomatous polyp of colon 07/02/2009   As of 09/2016, pt is aware he is overdue for repeat colonoscopy---he knows to call [facility] to set this up.  . Hyperlipidemia    Intol of multiple statins--offered zetia trial 04/2017.  [name] Hypertension   . Lipoma    back  . 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  . Seasonal allergies   . Tachyarrhythmia 1980   age 70 X 2 over 40 month period.  No recurrence since that time.  . Vocal cord paralysis 07/2016   Left vocal cord paralysis noted after thyroid surgery.    Patient Active Problem List   Diagnosis Date Noted  . Hyperlipidemia   . 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 Vocal cord paralysis 08/12/2016  . Goiter 07/31/2016  . Cough 03/06/2015  . Type 2 diabetes mellitus ([place]) 08/09/2012  . Essential hypertension 08/07/2012  . Mixed hyperlipidemia 08/07/2012  . CAD (coronary artery disease) 04/28/2011  . Unspecified adverse effect of unspecified drug, medicinal and biological substance 04/28/2011  . Lipoma 03/01/2011  . Tremor 03/01/2011  . Hx of adenomatous polyp of colon 07/02/2009  . UNSPECIFIED PROSTATITIS 05/05/2009    Past Surgical History:  Procedure Laterality Date  . CARDIAC CATHETERIZATION  1980  . COLONOSCOPY W/ POLYPECTOMY  07/02/2009   tubular adenoma (recall 5 yrs; pt aware he is overdue as of 09/2016 and knows  to call [facility] to set this up.  . ESOPHAGOGASTRODUODENOSCOPY  07/02/2009   NORMAL  . EYE SURGERY    . finger amputaion     with reattachement  . LASIK    . THYROIDECTOMY N/A 08/03/2016   Procedure: Total THYROIDECTOMY;  Surgeon: [name], MD;  Location: [facility];  Service: ENT;  Laterality: N/A;  Total Thyroidectomy        Home Medications    Prior to Admission medications   Medication Sig Start Date End Date Taking? Authorizing Provider  ALPRAZolam (XANAX) 0.25 MG tablet Take 1 tablet  (0.25 mg total) by mouth 2 (two) times daily as needed for anxiety. 08/05/16   Elgergawy, Silver Huguenin, MD  amLODipine (NORVASC) 5 MG tablet Take 1 tablet (5 mg total) by mouth 2 (two) times daily. 09/15/16   McGowen, Adrian Blackwater, MD  aspirin 81 MG tablet Take 81 mg by mouth daily.    [provider]  azithromycin (ZITHROMAX) 250 MG tablet 2 tabs po qd x 1d, then 1 tab po qd x 4d 04/21/17   McGowen, Adrian Blackwater, MD  cholecalciferol (VITAMIN D) 1000 units tablet Take 1,000 Units by mouth daily.    [provider]  cloNIDine (CATAPRES) 0.3 MG tablet Take 1 tablet (0.3 mg total) by mouth 2 (two) times daily. 12/16/16   McGowen, Adrian Blackwater, MD  ezetimibe (ZETIA) 10 MG tablet Take 1 tablet (10 mg total) by mouth daily. 04/25/17   McGowen, Adrian Blackwater, MD  levothyroxine (SYNTHROID, LEVOTHROID) 137 MCG tablet TAKE 1 TABLET BY MOUTH  DAILY BEFORE BREAKFAST 04/12/17   Elayne Snare, MD  metFORMIN (GLUCOPHAGE) 500 MG tablet TAKE 1 TABLET BY MOUTH TWO  TIMES DAILY WITH A MEAL 04/12/17   McGowen, Adrian Blackwater, MD  predniSONE (DELTASONE) 20 MG tablet 2 tabs po qd x 5d, then 1 tab po qd x 5d 04/21/17   McGowen, Adrian Blackwater, MD  ramipril (ALTACE) 10 MG capsule 1 cap po bid Patient taking differently: Take 10 mg by mouth 2 (two) times daily.  09/15/16   McGowen, Adrian Blackwater, MD  senna-docusate (SENOKOT-S) 8.6-50 MG tablet Take 1 tablet by mouth 2 (two) times daily. 08/05/16   Elgergawy, Silver Huguenin, MD    Family History Family History  Problem Relation Age of Onset  . Diabetes Mother   . Cancer Mother        lung  . COPD Mother   . Brain cancer Mother   . Arthritis Mother   . Hyperlipidemia Mother   . Heart disease Mother   . Hypertension Mother   . Diabetes Father   . Cancer Father        mesothelioma; skin  . Mental illness Father   . Arthritis Father   . Hyperlipidemia Father   . Heart disease Father   . Hypertension Father   . Heart disease Maternal Grandmother        MI @ 37  . Heart disease Maternal Grandfather         MI @ 67  . Cancer Paternal Grandmother   . Diabetes Paternal Grandmother   . Heart disease Paternal Grandfather        MI @ 69  . Thyroid cancer Maternal Aunt     Social History Social History   Tobacco Use  . Smoking status: Never Smoker  . Smokeless tobacco: Current User    Types: Snuff  Substance Use Topics  . Alcohol use: No  . Drug use: No  Allergies   Tenormin [atenolol]; Tetracycline; Lipitor [atorvastatin]; and Guaifenesin   Review of Systems Review of Systems  All other systems reviewed and are negative.    Physical Exam Updated Vital Signs BP (!) 161/87   Pulse (!) 117   Temp 99.9 F (37.7 C) (Oral)   Resp 18   SpO2 93%   Physical Exam  Constitutional: He appears well-developed and well-nourished.  HENT:  Head: Normocephalic and atraumatic.  Right Ear: Hearing, tympanic membrane and ear canal normal. There is mastoid tenderness.  Left Ear: Hearing, tympanic membrane, external ear and ear canal normal.  Nose: Mucosal edema present. Right sinus exhibits maxillary sinus tenderness. Right sinus exhibits no frontal sinus tenderness. Left sinus exhibits maxillary sinus tenderness. Left sinus exhibits no frontal sinus tenderness.  Mouth/Throat: Uvula is midline, oropharynx is clear and moist and mucous membranes are normal. No tonsillar exudate.  Patient does have tenderness to palpation over the right mastoid.  There is no edema or erythema.  No obliteration of the postauricular crease.  No cranial nerve palsies. The patient has normal phonation and is in control of secretions. No stridor.  Midline uvula without edema. Soft palate rises symmetrically.  No tonsillar erythema or exudates. No PTA. Tongue protrusion is normal. No trismus. No creptius on neck palpation and patient has good dentition. No gingival erythema or fluctuance noted. Mucus membranes moist.   Eyes: Pupils are equal, round, and reactive to light. Right eye exhibits no discharge. Left eye  exhibits no discharge. No scleral icterus.  Neck: Trachea normal. Neck supple. No JVD present. No spinous process tenderness present. Carotid bruit is not present. No neck rigidity. Normal range of motion present.  No nuchal rigidity or meningismus.  Able to touch chin to chest without difficulty.  Cardiovascular: Normal rate, regular rhythm and intact distal pulses.  No murmur heard. Pulses:      Radial pulses are 2+ on the right side, and 2+ on the left side.       Dorsalis pedis pulses are 2+ on the right side, and 2+ on the left side.       Posterior tibial pulses are 2+ on the right side, and 2+ on the left side.  No lower extremity swelling or edema. Calves symmetric in size bilaterally.  Pulmonary/Chest: Effort normal. He has decreased breath sounds in the right lower field and the left lower field. He exhibits no tenderness.  Abdominal: Soft. Bowel sounds are normal. There is no tenderness. There is no rebound and no guarding.  Musculoskeletal: He exhibits no edema.  Lymphadenopathy:    He has no cervical adenopathy.  Neurological: He is alert.  Speech clear. Follows commands. No facial droop. PERRLA. EOMI. Normal peripheral fields. CN III-XII intact.  Grossly moves all extremities 4 without ataxia. Coordination intact. Able and appropriate strength for age to upper and lower extremities bilaterally including grip strength. Sensation to light touch intact bilaterally for upper and lower. Normal finger to nose and rapid alternating movements. Normal heel to shin balance.  No pronator drift.   Skin: Skin is warm, dry and intact. No petechiae, no purpura and no rash noted. He is not diaphoretic.  Psychiatric: He has a normal mood and affect.  Nursing note and vitals reviewed.    ED Treatments / Results  Labs (all labs ordered are listed, but only abnormal results are displayed) Labs Reviewed  COMPREHENSIVE METABOLIC PANEL - Abnormal; Notable for the following components:       Result Value  Glucose, Bld 147 (*)    All other components within normal limits  CBC WITH DIFFERENTIAL/PLATELET - Abnormal; Notable for the following components:   Neutro Abs 8.8 (*)    Lymphs Abs 0.4 (*)    All other components within normal limits  URINALYSIS, ROUTINE W REFLEX MICROSCOPIC - Abnormal; Notable for the following components:   Color, Urine STRAW (*)    Hgb urine dipstick SMALL (*)    Squamous Epithelial / LPF 0-5 (*)    All other components within normal limits  INFLUENZA PANEL BY PCR (TYPE A & B)  I-STAT CG4 LACTIC ACID, ED  I-STAT CG4 LACTIC ACID, ED    EKG  EKG Interpretation None       Radiology Dg Chest 2 View  Result Date: 05/03/2017 CLINICAL DATA:  Pt reports 2-3 weeks ago, he was dx with bronchitis and started on antibiotics and prednisone. Pt reports fever that started yesterday and got worse, throbbing headache. EXAM: CHEST - 2 VIEW COMPARISON:  08/05/2016 FINDINGS: The heart size and mediastinal contours are within normal limits. Both lungs are clear. The visualized skeletal structures are unremarkable. IMPRESSION: No active cardiopulmonary disease. Electronically Signed   By: Kathreen Devoid   On: 05/03/2017 20:47   Ct Maxillofacial Wo Contrast  Result Date: 05/03/2017 CLINICAL DATA:  Headache query dental, sinus or mastoid etiology. EXAM: CT MAXILLOFACIAL WITHOUT CONTRAST TECHNIQUE: Multidetector CT imaging of the maxillofacial structures was performed. Multiplanar CT image reconstructions were also generated. COMPARISON:  07/31/2016 neck CT FINDINGS: Osseous: Small mucous retention cyst or polypoid mucosal thickening off the medial wall of the inferior right mastoid sinus. Minimal mucosal thickening is otherwise noted along the maxillary antrum and lateral wall. No air-fluid levels are visualized of the paranasal sinuses. Minimal mucosal thickening is seen of the posterior left ethmoid sinus. Frontal sinus is clear. The sphenoid sinus is unremarkable.  Mastoids are clear. Dentition appears intact without evidence of significant dental carie nor periodontal disease. Temporomandibular joints are maintained bilaterally without significant joint space narrowing osteoarthritis. Orbits: Symmetric appearing orbits and globes. Extraocular muscles are symmetric in appearance without enlargement. Optic nerves are normal in caliber and symmetric. Sinuses: As above Soft tissues: Salivary glands are unremarkable without acute inflammatory process or sialolithiasis. Limited intracranial: Negative IMPRESSION: 1. Small mucous retention cyst or polypoid mucosal thickening of the right maxillary sinus. 2. No acute sinusitis, mastoiditis or significant dental/periosteal abnormality. Electronically Signed   By: Ashley Royalty M.D.   On: 05/03/2017 20:23    Procedures Procedures (including critical care time)  Medications Ordered in ED Medications  sodium chloride 0.9 % bolus 1,000 mL (not administered)  morphine 4 MG/ML injection 4 mg (not administered)     Initial Impression / Assessment and Plan / ED Course  I have reviewed the triage vital signs and the nursing notes.  Pertinent labs & imaging results that were available during my care of the patient were reviewed by me and considered in my medical decision making (see chart for details).      62 y.o. male presenting for fever. Patinet was recently dx with bronchopneumonia by his PCP on 04/21/17 and dc with 5 days of 500mg  of azithromycin and 10 day taper of prednisone. He has completed these medications. He reports symptoms were improving but over the last 2 days he has felt increased nasal congestion, rhinorrhea and cough. He reports that he began running a fever yesterday and having pain over his right mastoid. He was found to be febrile by  EMS and received 1000mg  of tylenol. On arrival the patient is afebrile but noted to be tachycardic. He was placed on O2 by ems but after being taken off this maintained >95%  on RA with good wave for on the monitor and did not show signs of hypoxia. He is not complaining of sob or chest pain.   Patient is with reported fever and documented tachycardia. Will order lactic acid, influenza, basic labs and urinalysis. Will administer pain medication and IVF. Will obtain chest xray to evaluate for uri symptoms. Will also order CT sinuses given patients pain over the right mastoid. There is no obliteration over the post auricular crease and no erythema or edema. No cranial nerve palsies. Patient is not with meningeal signs. No focal neurologic deficits.   Patient workup reassuring. UA without UTI. Lactic acid wnl. Vitals improved after IVF.  Do not suspect sepsis. No significant electrolyte derangements. Cr wnl. Normal LFT's. No DKA. No leukocytosis. No anemia. CXR without active cardiopulmonary disease. CT maxillofacial without signs of sinusitis, mastoiditis. Suspect patient's symptoms are 2/2 to influenza like illness. Will send cx. Result pending. Patient outside 48 hour window. Will treat conservatively. I advised the patient to follow-up with PCP this week. Specific return precautions discussed. Time was given for all questions to be answered. The patient verbalized understanding and agreement with plan. The patient appears safe for discharge home.  Will discharge with strict return precautions and close follow up with PCP.   Vitals:   05/03/17 1844 05/03/17 2130 05/03/17 2145 05/03/17 2200  BP:  (!) 158/88 (!) 154/88 (!) 159/88  Pulse:  98 100 98  Resp:    19  Temp:      TempSrc:      SpO2: 93% 97% 96% 97%   Patient case discussed with Dr. Eulis Foster who is in agreement with plan.  Final Clinical Impressions(s) / ED Diagnoses   Final diagnoses:  Influenza-like illness    ED Discharge Orders        Ordered    benzonatate (TESSALON) 100 MG capsule  Every 8 hours     05/03/17 2256    ibuprofen (ADVIL,MOTRIN) 800 MG tablet  3 times daily     05/03/17 2256         Jillyn Ledger, PA-C 05/05/17 1104    Jillyn Ledger, PA-C 05/05/17 1105    Daleen Bo, MD 05/06/17 1754

## 2017-05-03 NOTE — Discharge Instructions (Signed)
Please read and follow all provided instructions.  Your diagnoses today include:  1. Influenza-like illness     Tests performed today include: Vital signs. See below for your results today.  Chest xray CT scan of sinus and mastoid Blood work Influenza screen - pending Urinalysis   Medications prescribed:  Take as prescribed For pain and fever control you may take: 800mg  of ibuprofen up to 3 times a day (please take with food) and acetaminophen 975mg  (this is 3 normal strength, 325mg , over the counter pills) up to four times a day. Please do not take more than this. Do not drink alcohol or combine with other medications that have acetaminophen or Ibuprofen as an ingredient (Read the labels!).   Home care instructions:  Follow any educational materials contained in this packet.  Follow-up instructions: Please follow-up with your primary care provider for further evaluation of symptoms and treatment   Return instructions:  Please return to the Emergency Department if you do not get better, if you get worse, or new symptoms  Abdominal pain Vomiting Diarrhea Shortness of breath Chest pain Cough up blood  Neck stiffness Fever not controlled by tylenol or ibuprofen  - Bleeding that does not stop with holding pressure to the area    -Severe pain (please note that you may be more sore the day after your accident)  - Chest Pain  - Difficulty breathing  - Severe nausea or vomiting  - Inability to tolerate food and liquids  - Passing out  - Skin becoming red around your wounds  - Change in mental status (confusion or lethargy)  - New numbness or weakness    Please return if you have any other emergent concerns.

## 2017-05-03 NOTE — ED Triage Notes (Signed)
Per [facility], Pt from home. Pt reports 2-3 weeks ago he dx with bronchitis and started on antibiotics and prednisone. Pt reports fever that started yesterday and got worse, throbbing headache. Pt also reports feeling near syncopal. Pt's BP with EMS was 212/120, HR 120. Pt complains of throbbing to R side of neck and up behind ear. Pt given 1000mg  of tylenol by EMS. Pt alert and oriented.

## 2017-05-04 LAB — INFLUENZA PANEL BY PCR (TYPE A & B)
INFLBPCR: NEGATIVE
Influenza A By PCR: NEGATIVE

## 2017-05-07 ENCOUNTER — Encounter (HOSPITAL_COMMUNITY): Payer: Self-pay | Admitting: Emergency Medicine

## 2017-05-07 ENCOUNTER — Emergency Department (HOSPITAL_COMMUNITY): Payer: 59

## 2017-05-07 ENCOUNTER — Inpatient Hospital Stay (HOSPITAL_COMMUNITY)
Admission: EM | Admit: 2017-05-07 | Discharge: 2017-05-09 | DRG: 193 | Disposition: A | Discharge status: Home / Self Care | Payer: 59 | Attending: Internal Medicine | Admitting: Internal Medicine

## 2017-05-07 DIAGNOSIS — I10 Essential (primary) hypertension: Secondary | ICD-10-CM

## 2017-05-07 DIAGNOSIS — Z7984 Long term (current) use of oral hypoglycemic drugs: Secondary | ICD-10-CM | POA: Diagnosis not present

## 2017-05-07 DIAGNOSIS — J9600 Acute respiratory failure, unspecified whether with hypoxia or hypercapnia: Secondary | ICD-10-CM | POA: Diagnosis present

## 2017-05-07 DIAGNOSIS — Z7952 Long term (current) use of systemic steroids: Secondary | ICD-10-CM

## 2017-05-07 DIAGNOSIS — Z888 Allergy status to other drugs, medicaments and biological substances status: Secondary | ICD-10-CM | POA: Diagnosis not present

## 2017-05-07 DIAGNOSIS — E890 Postprocedural hypothyroidism: Secondary | ICD-10-CM | POA: Diagnosis present

## 2017-05-07 DIAGNOSIS — Z881 Allergy status to other antibiotic agents status: Secondary | ICD-10-CM | POA: Diagnosis not present

## 2017-05-07 DIAGNOSIS — J3801 Paralysis of vocal cords and larynx, unilateral: Secondary | ICD-10-CM | POA: Diagnosis present

## 2017-05-07 DIAGNOSIS — I714 Abdominal aortic aneurysm, without rupture, unspecified: Secondary | ICD-10-CM | POA: Insufficient documentation

## 2017-05-07 DIAGNOSIS — R911 Solitary pulmonary nodule: Secondary | ICD-10-CM | POA: Diagnosis present

## 2017-05-07 DIAGNOSIS — E785 Hyperlipidemia, unspecified: Secondary | ICD-10-CM | POA: Diagnosis present

## 2017-05-07 DIAGNOSIS — E119 Type 2 diabetes mellitus without complications: Secondary | ICD-10-CM

## 2017-05-07 DIAGNOSIS — F17220 Nicotine dependence, chewing tobacco, uncomplicated: Secondary | ICD-10-CM | POA: Diagnosis present

## 2017-05-07 DIAGNOSIS — I159 Secondary hypertension, unspecified: Secondary | ICD-10-CM | POA: Diagnosis not present

## 2017-05-07 DIAGNOSIS — J189 Pneumonia, unspecified organism: Secondary | ICD-10-CM | POA: Diagnosis present

## 2017-05-07 DIAGNOSIS — E782 Mixed hyperlipidemia: Secondary | ICD-10-CM

## 2017-05-07 DIAGNOSIS — Z79899 Other long term (current) drug therapy: Secondary | ICD-10-CM | POA: Diagnosis not present

## 2017-05-07 DIAGNOSIS — Z7982 Long term (current) use of aspirin: Secondary | ICD-10-CM | POA: Diagnosis not present

## 2017-05-07 HISTORY — DX: Pneumonia, unspecified organism: J18.9

## 2017-05-07 LAB — BASIC METABOLIC PANEL
Anion gap: 11 (ref 5–15)
BUN: 14 mg/dL (ref 6–20)
CALCIUM: 8.1 mg/dL — AB (ref 8.9–10.3)
CO2: 22 mmol/L (ref 22–32)
Chloride: 103 mmol/L (ref 101–111)
Creatinine, Ser: 1.38 mg/dL — ABNORMAL HIGH (ref 0.61–1.24)
GFR calc Af Amer: >60 mL/min (ref >60)
GFR, EST NON AFRICAN AMERICAN: 54 mL/min — AB (ref >60)
GLUCOSE: 163 mg/dL — AB (ref 65–99)
Potassium: 3.8 mmol/L (ref 3.5–5.1)
Sodium: 136 mmol/L (ref 135–145)

## 2017-05-07 LAB — I-STAT TROPONIN, ED: Troponin i, poc: 0.01 ng/mL (ref 0.00–0.08)

## 2017-05-07 LAB — CBC
HCT: 39.3 % (ref 39.0–52.0)
HEMATOCRIT: 41 % (ref 39.0–52.0)
HEMOGLOBIN: 13.6 g/dL (ref 13.0–17.0)
Hemoglobin: 14.2 g/dL (ref 13.0–17.0)
MCH: 31.2 pg (ref 26.0–34.0)
MCH: 31 pg (ref 26.0–34.0)
MCHC: 34.6 g/dL (ref 30.0–36.0)
MCHC: 34.6 g/dL (ref 30.0–36.0)
MCV: 90.1 fL (ref 78.0–100.0)
MCV: 89.5 fL (ref 78.0–100.0)
Platelets: 155 10*3/uL (ref 150–400)
Platelets: 140 10*3/uL — ABNORMAL LOW (ref 150–400)
RBC: 4.55 MIL/uL (ref 4.22–5.81)
RBC: 4.39 MIL/uL (ref 4.22–5.81)
RDW: 13.7 % (ref 11.5–15.5)
RDW: 13.8 % (ref 11.5–15.5)
WBC: 4.1 10*3/uL (ref 4.0–10.5)
WBC: 2.7 10*3/uL — AB (ref 4.0–10.5)

## 2017-05-07 LAB — GLUCOSE, CAPILLARY
Glucose-Capillary: 125 mg/dL — ABNORMAL HIGH (ref 65–99)
Glucose-Capillary: 178 mg/dL — ABNORMAL HIGH (ref 65–99)
Glucose-Capillary: 132 mg/dL — ABNORMAL HIGH (ref 65–99)

## 2017-05-07 LAB — HEMOGLOBIN A1C
HEMOGLOBIN A1C: 6.5 % — AB (ref 4.8–5.6)
MEAN PLASMA GLUCOSE: 139.85 mg/dL

## 2017-05-07 LAB — I-STAT CG4 LACTIC ACID, ED: Lactic Acid, Venous: 1.28 mmol/L (ref 0.5–1.9)

## 2017-05-07 LAB — CREATININE, SERUM
CREATININE: 1.18 mg/dL (ref 0.61–1.24)
GFR calc non Af Amer: >60 mL/min (ref >60)

## 2017-05-07 LAB — BRAIN NATRIURETIC PEPTIDE: B NATRIURETIC PEPTIDE 5: 6.9 pg/mL (ref 0.0–100.0)

## 2017-05-07 MED ORDER — BENZONATATE 100 MG PO CAPS
100.0000 mg | ORAL_CAPSULE | Freq: Three times a day (TID) | ORAL | Status: DC
  Administered 2017-05-07 – 2017-05-09 (×5): 100 mg via ORAL
  Filled 2017-05-07 (×6): qty 1

## 2017-05-07 MED ORDER — METFORMIN HCL 500 MG PO TABS
500.0000 mg | ORAL_TABLET | Freq: Two times a day (BID) | ORAL | Status: DC
  Administered 2017-05-07 – 2017-05-09 (×4): 500 mg via ORAL
  Filled 2017-05-07 (×5): qty 1

## 2017-05-07 MED ORDER — IBUPROFEN 800 MG PO TABS
800.0000 mg | ORAL_TABLET | Freq: Four times a day (QID) | ORAL | Status: DC | PRN
  Administered 2017-05-07 – 2017-05-08 (×3): 800 mg via ORAL
  Filled 2017-05-07 (×3): qty 1

## 2017-05-07 MED ORDER — ASPIRIN 81 MG PO CHEW
81.0000 mg | CHEWABLE_TABLET | Freq: Every day | ORAL | Status: DC
  Administered 2017-05-07 – 2017-05-09 (×3): 81 mg via ORAL
  Filled 2017-05-07 (×3): qty 1

## 2017-05-07 MED ORDER — AZITHROMYCIN 250 MG PO TABS
500.0000 mg | ORAL_TABLET | Freq: Once | ORAL | Status: AC
  Administered 2017-05-07: 500 mg via ORAL
  Filled 2017-05-07: qty 2

## 2017-05-07 MED ORDER — SENNOSIDES-DOCUSATE SODIUM 8.6-50 MG PO TABS
1.0000 | ORAL_TABLET | Freq: Two times a day (BID) | ORAL | Status: DC
  Administered 2017-05-07 – 2017-05-09 (×4): 1 via ORAL
  Filled 2017-05-07 (×4): qty 1

## 2017-05-07 MED ORDER — ENOXAPARIN SODIUM 40 MG/0.4ML ~~LOC~~ SOLN
40.0000 mg | SUBCUTANEOUS | Status: DC
  Administered 2017-05-07 – 2017-05-08 (×2): 40 mg via SUBCUTANEOUS
  Filled 2017-05-07 (×2): qty 0.4

## 2017-05-07 MED ORDER — EZETIMIBE 10 MG PO TABS
10.0000 mg | ORAL_TABLET | Freq: Every day | ORAL | Status: DC
  Administered 2017-05-08 – 2017-05-09 (×2): 10 mg via ORAL
  Filled 2017-05-07 (×3): qty 1

## 2017-05-07 MED ORDER — IPRATROPIUM-ALBUTEROL 0.5-2.5 (3) MG/3ML IN SOLN
3.0000 mL | Freq: Once | RESPIRATORY_TRACT | Status: AC
  Administered 2017-05-07: 3 mL via RESPIRATORY_TRACT
  Filled 2017-05-07: qty 3

## 2017-05-07 MED ORDER — CLONIDINE HCL 0.2 MG PO TABS
0.3000 mg | ORAL_TABLET | Freq: Two times a day (BID) | ORAL | Status: DC
  Administered 2017-05-07 – 2017-05-09 (×4): 0.3 mg via ORAL
  Filled 2017-05-07 (×4): qty 1

## 2017-05-07 MED ORDER — AMLODIPINE BESYLATE 5 MG PO TABS
5.0000 mg | ORAL_TABLET | Freq: Two times a day (BID) | ORAL | Status: DC
  Administered 2017-05-07 – 2017-05-09 (×4): 5 mg via ORAL
  Filled 2017-05-07 (×4): qty 1

## 2017-05-07 MED ORDER — DIPHENHYDRAMINE HCL 12.5 MG/5ML PO ELIX: 12.5000 mg | ORAL_SOLUTION | Freq: Three times a day (TID) | ORAL | Status: DC | PRN

## 2017-05-07 MED ORDER — AZITHROMYCIN 250 MG PO TABS
500.0000 mg | ORAL_TABLET | ORAL | Status: DC
  Administered 2017-05-08: 500 mg via ORAL
  Filled 2017-05-07: qty 2

## 2017-05-07 MED ORDER — LEVOTHYROXINE SODIUM 25 MCG PO TABS
137.0000 ug | ORAL_TABLET | Freq: Every day | ORAL | Status: DC
  Administered 2017-05-07 – 2017-05-09 (×3): 137 ug via ORAL
  Filled 2017-05-07 (×4): qty 1

## 2017-05-07 MED ORDER — IOPAMIDOL (ISOVUE-370) INJECTION 76%
INTRAVENOUS | Status: AC
  Administered 2017-05-07: 80 mL
  Filled 2017-05-07: qty 100

## 2017-05-07 MED ORDER — RAMIPRIL 10 MG PO CAPS
10.0000 mg | ORAL_CAPSULE | Freq: Two times a day (BID) | ORAL | Status: DC
  Administered 2017-05-07 – 2017-05-09 (×4): 10 mg via ORAL
  Filled 2017-05-07 (×4): qty 1

## 2017-05-07 MED ORDER — SODIUM CHLORIDE 0.9 % IV SOLN
1.0000 g | INTRAVENOUS | Status: DC
  Administered 2017-05-08: 1 g via INTRAVENOUS
  Filled 2017-05-07 (×2): qty 10

## 2017-05-07 MED ORDER — INSULIN ASPART 100 UNIT/ML ~~LOC~~ SOLN
0.0000 [IU] | Freq: Three times a day (TID) | SUBCUTANEOUS | Status: DC
  Administered 2017-05-08 – 2017-05-09 (×3): 2 [IU] via SUBCUTANEOUS

## 2017-05-07 MED ORDER — SODIUM CHLORIDE 0.9 % IV SOLN
INTRAVENOUS | Status: AC
  Administered 2017-05-07: 16:00:00 via INTRAVENOUS

## 2017-05-07 MED ORDER — SODIUM CHLORIDE 0.9 % IV BOLUS (SEPSIS)
1000.0000 mL | Freq: Once | INTRAVENOUS | Status: AC
  Administered 2017-05-07: 1000 mL via INTRAVENOUS

## 2017-05-07 MED ORDER — VITAMIN D 1000 UNITS PO TABS
1000.0000 [IU] | ORAL_TABLET | Freq: Every day | ORAL | Status: DC
  Administered 2017-05-07 – 2017-05-09 (×3): 1000 [IU] via ORAL
  Filled 2017-05-07 (×3): qty 1

## 2017-05-07 MED ORDER — ALPRAZOLAM 0.25 MG PO TABS: 0.2500 mg | ORAL_TABLET | Freq: Two times a day (BID) | ORAL | Status: DC | PRN

## 2017-05-07 MED ORDER — SODIUM CHLORIDE 0.9 % IV SOLN
1.0000 g | Freq: Once | INTRAVENOUS | Status: AC
  Administered 2017-05-07: 1 g via INTRAVENOUS
  Filled 2017-05-07: qty 10

## 2017-05-07 NOTE — ED Notes (Signed)
Patient transported to X-ray 

## 2017-05-07 NOTE — ED Provider Notes (Signed)
[facility] EMERGENCY DEPARTMENT Provider Note   CSN: [id number] Arrival date & time: 05/07/17  2505     History   Chief Complaint Chief Complaint  Patient presents with  . Shortness of Breath    HPI NATION CRADLE is a 62 y.o. male.  He states he has been sick since February with respiratory problems.  He was at his PCP I believe and treated with Zithromax and prednisone.  Patient states he did not feel any better with that.  He was here on the 22nd with continued troubles breathing nasal congestion and some right-sided neck pain.  He had labs done then a chest x-ray and CT sinus and mastoid that were unremarkable.  Flu testing is been negative.  He was discharged on Tessalon and ibuprofen.  His complaints today are 5 days of continued fevers from 100-104, with night sweats and difficulty breathing.  States he is unable to lay down flat and has woken up being her hungry and needed to go outside.  He has a cough but it is minimally productive.  There is no chest pain.  Denies of the other infectious symptoms although he recently just had a few episodes of diarrhea.  The only exposure he can think of was that he was burning some leaves recently.  No travel out of the country.  The history is provided by the patient.  Shortness of Breath  This is a new problem. The problem occurs continuously.The problem has not changed since onset.Associated symptoms include a fever, coryza, rhinorrhea, neck pain, cough, PND and orthopnea. Pertinent negatives include no sore throat, no ear pain, no sputum production, no hemoptysis, no wheezing, no chest pain, no syncope, no vomiting, no abdominal pain, no rash and no leg swelling. It is unknown what precipitated the problem. He has tried oral steroids for the symptoms. The treatment provided no relief. He has had no prior hospitalizations. He has had prior ED visits. Associated medical issues do not include asthma, COPD, PE, heart failure or  DVT.    Past Medical History:  Diagnosis Date  . Arthritis   . Diabetes mellitus without complication ([place])   . History of thyroidectomy, total 2018   Goiter.  [name] Hx of adenomatous polyp of colon 07/02/2009   As of 09/2016, pt is aware he is overdue for repeat colonoscopy---he knows to call [facility] to set this up.  . Hyperlipidemia    Intol of multiple statins--offered zetia trial 04/2017.  [name] Hypertension   . Lipoma    back  . 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  . Seasonal allergies   . Tachyarrhythmia 1980   age 70 X 2 over 66 month period.  No recurrence since that time.  . Vocal cord paralysis 07/2016   Left vocal cord paralysis noted after thyroid surgery.    Patient Active Problem List   Diagnosis Date Noted  . Hyperlipidemia   . 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 Vocal cord paralysis 08/12/2016  . Goiter 07/31/2016  . Cough 03/06/2015  . Type 2 diabetes mellitus ([place]) 08/09/2012  . Essential hypertension 08/07/2012  . Mixed hyperlipidemia 08/07/2012  . CAD (coronary artery disease) 04/28/2011  . Unspecified adverse effect of unspecified drug, medicinal and biological substance 04/28/2011  . Lipoma 03/01/2011  . Tremor 03/01/2011  . Hx of adenomatous polyp of colon 07/02/2009  . UNSPECIFIED PROSTATITIS 05/05/2009    Past Surgical History:  Procedure Laterality Date  . CARDIAC  CATHETERIZATION  1980  . COLONOSCOPY W/ POLYPECTOMY  07/02/2009   tubular adenoma (recall 5 yrs; pt aware he is overdue as of 09/2016 and knows to call [facility] to set this up.  . ESOPHAGOGASTRODUODENOSCOPY  07/02/2009   NORMAL  . EYE SURGERY    . finger amputaion     with reattachement  . LASIK    . THYROIDECTOMY N/A 08/03/2016   Procedure: Total THYROIDECTOMY;  Surgeon: [name], MD;  Location: [facility];  Service: ENT;  Laterality: N/A;  Total Thyroidectomy         Home Medications    Prior to Admission medications     Medication Sig Start Date End Date Taking? Authorizing Provider  ALPRAZolam (XANAX) 0.25 MG tablet Take 1 tablet (0.25 mg total) by mouth 2 (two) times daily as needed for anxiety. 08/05/16   Elgergawy, Silver Huguenin, MD  amLODipine (NORVASC) 5 MG tablet Take 1 tablet (5 mg total) by mouth 2 (two) times daily. 09/15/16   McGowen, Adrian Blackwater, MD  aspirin 81 MG tablet Take 81 mg by mouth daily.    [provider]  azithromycin (ZITHROMAX) 250 MG tablet 2 tabs po qd x 1d, then 1 tab po qd x 4d 04/21/17   McGowen, Adrian Blackwater, MD  benzonatate (TESSALON) 100 MG capsule Take 1 capsule (100 mg total) by mouth every 8 (eight) hours. 05/03/17   Maczis, Barth Kirks, PA-C  cholecalciferol (VITAMIN D) 1000 units tablet Take 1,000 Units by mouth daily.    [provider]  cloNIDine (CATAPRES) 0.3 MG tablet Take 1 tablet (0.3 mg total) by mouth 2 (two) times daily. 12/16/16   McGowen, Adrian Blackwater, MD  ezetimibe (ZETIA) 10 MG tablet Take 1 tablet (10 mg total) by mouth daily. 04/25/17   McGowen, Adrian Blackwater, MD  ibuprofen (ADVIL,MOTRIN) 800 MG tablet Take 1 tablet (800 mg total) by mouth 3 (three) times daily. 05/03/17   Maczis, Barth Kirks, PA-C  levothyroxine (SYNTHROID, LEVOTHROID) 137 MCG tablet TAKE 1 TABLET BY MOUTH  DAILY BEFORE BREAKFAST 04/12/17   Elayne Snare, MD  metFORMIN (GLUCOPHAGE) 500 MG tablet TAKE 1 TABLET BY MOUTH TWO  TIMES DAILY WITH A MEAL 04/12/17   McGowen, Adrian Blackwater, MD  predniSONE (DELTASONE) 20 MG tablet 2 tabs po qd x 5d, then 1 tab po qd x 5d 04/21/17   McGowen, Adrian Blackwater, MD  ramipril (ALTACE) 10 MG capsule 1 cap po bid Patient taking differently: Take 10 mg by mouth 2 (two) times daily.  09/15/16   McGowen, Adrian Blackwater, MD  senna-docusate (SENOKOT-S) 8.6-50 MG tablet Take 1 tablet by mouth 2 (two) times daily. 08/05/16   Elgergawy, Silver Huguenin, MD    Family History Family History  Problem Relation Age of Onset  . Diabetes Mother   . Cancer Mother        lung  . COPD Mother   . Brain cancer Mother    . Arthritis Mother   . Hyperlipidemia Mother   . Heart disease Mother   . Hypertension Mother   . Diabetes Father   . Cancer Father        mesothelioma; skin  . Mental illness Father   . Arthritis Father   . Hyperlipidemia Father   . Heart disease Father   . Hypertension Father   . Heart disease Maternal Grandmother        MI @ 72  . Heart disease Maternal Grandfather        MI @ 34  .  Cancer Paternal Grandmother   . Diabetes Paternal Grandmother   . Heart disease Paternal Grandfather        MI @ 76  . Thyroid cancer Maternal Aunt     Social History Social History   Tobacco Use  . Smoking status: Never Smoker  . Smokeless tobacco: Current User    Types: Snuff  Substance Use Topics  . Alcohol use: No  . Drug use: No     Allergies   Tenormin [atenolol]; Tetracycline; Lipitor [atorvastatin]; and Guaifenesin   Review of Systems Review of Systems  Constitutional: Positive for fatigue and fever. Negative for chills.  HENT: Positive for rhinorrhea and sneezing. Negative for ear pain and sore throat.   Eyes: Negative for pain and visual disturbance.  Respiratory: Positive for cough and shortness of breath. Negative for hemoptysis, sputum production and wheezing.   Cardiovascular: Positive for orthopnea and PND. Negative for chest pain, palpitations, leg swelling and syncope.  Gastrointestinal: Positive for diarrhea. Negative for abdominal pain and vomiting.  Genitourinary: Negative for dysuria and hematuria.  Musculoskeletal: Positive for neck pain. Negative for arthralgias and back pain.  Skin: Negative for color change and rash.  Neurological: Negative for seizures and syncope.  All other systems reviewed and are negative.    Physical Exam Updated Vital Signs BP (!) 149/95 (BP Location: Right Arm)   Pulse 97   Temp 98.3 F (36.8 C) (Oral)   Resp 18   SpO2 92%   Physical Exam  Constitutional: He appears well-developed and well-nourished.  HENT:  Head:  Normocephalic and atraumatic.  Nose: Nose normal.  Mouth/Throat: Oropharynx is clear and moist.  Eyes: Conjunctivae are normal.  Neck: Neck supple.  Cardiovascular: Normal rate and regular rhythm.  No murmur heard. Pulmonary/Chest: Effort normal. No respiratory distress. He has rhonchi (diffuse).  Abdominal: Soft. He exhibits no mass. There is no tenderness. There is no guarding.  Musculoskeletal: Normal range of motion. He exhibits no edema.       Right lower leg: Normal. He exhibits no tenderness and no edema.       Left lower leg: Normal. He exhibits no tenderness and no edema.  Lymphadenopathy:    He has no cervical adenopathy.  Neurological: He is alert.  Skin: Skin is warm and dry. Capillary refill takes less than 2 seconds.  Psychiatric: He has a normal mood and affect.  Nursing note and vitals reviewed.    ED Treatments / Results  Labs (all labs ordered are listed, but only abnormal results are displayed) Labs Reviewed  CBC  BASIC METABOLIC PANEL  I-STAT TROPONIN, ED    EKG EKG Interpretation  Date/Time:  Sunday May 07 2017 06:43:34 EDT Ventricular Rate:  99 PR Interval:  160 QRS Duration: 90 QT Interval:  372 QTC Calculation: 477 R Axis:   99 Text Interpretation:  Normal sinus rhythm Rightward axis Nonspecific ST abnormality Abnormal ECG No significant change since last tracing Confirmed by Aletta Edouard [phone]) on 05/07/2017 7:04:02 AM   Radiology Dg Chest 2 View  Result Date: 05/07/2017 CLINICAL DATA:  Shortness of breast since February when he was diagnosed with the flu, breathing difficulty especially with exertion or lying down EXAM: CHEST - 2 VIEW COMPARISON:  05/03/2017 FINDINGS: Enlargement of cardiac silhouette. Mediastinal contours and pulmonary vascularity normal. Patchy infiltrates lower LEFT lung increased. Question minimal infiltrate in RIGHT mid lung. No pleural effusion or pneumothorax. Multilevel endplate spur formation thoracic spine.  Prominent RIGHT first costochondral junction stable. IMPRESSION: Increased LEFT basilar  infiltrate question pneumonia. Question minimal RIGHT mid lung infiltrate. Electronically Signed   By: Lavonia Dana M.D.   On: 05/07/2017 07:30   Ct Angio Chest Pe W/cm &/or Wo Cm  Result Date: 05/07/2017 CLINICAL DATA:  Shortness of breath especially with exertion and lying down, recent flu, history diabetes mellitus, hypertension EXAM: CT ANGIOGRAPHY CHEST WITH CONTRAST TECHNIQUE: Multidetector CT imaging of the chest was performed using the standard protocol during bolus administration of intravenous contrast. Multiplanar CT image reconstructions and MIPs were obtained to evaluate the vascular anatomy. CONTRAST:  18mL ISOVUE-370 IOPAMIDOL (ISOVUE-370) INJECTION 76% IV COMPARISON:  None FINDINGS: Cardiovascular: Aneurysmal dilatation of the ascending thoracic aorta 4.1 cm diameter image 37. No aortic dissection. Pulmonary arteries adequately opacified and patent. No evidence of pulmonary embolism. No pericardial effusion. Mediastinum/Nodes: Esophagus unremarkable. Base of cervical region normal appearance. No thoracic adenopathy. Lungs/Pleura: Airspace infiltrates throughout LEFT lower lobe with less in LEFT upper lobe/lingula and RIGHT upper lobe consistent with pneumonia. Some of the observed infiltrates are somewhat nodular in appearance especially in RIGHT lung where a focal 12 mm opacity image 39 and 19 mm opacity image 43 in the mid lung are identified. No pleural effusion or pneumothorax. Upper Abdomen: Visualized upper abdomen unremarkable Musculoskeletal: No acute osseous findings. Review of the MIP images confirms the above findings. IMPRESSION: No evidence of pulmonary embolism. Extensive patchy BILATERAL pulmonary infiltrates greater on LEFT consistent with multifocal lung pneumonia. Since some of these areas of consolidation are nodular in configuration, recommend follow-up noncontrast CT imaging in 3 months to  ensure resolution and exclude underlying pulmonary mass/nodule. 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 Recommend annual imaging followup by CTA or MRA. This recommendation follows 2010 ACCF/AHA/AATS/ACR/ASA/SCA/SCAI/SIR/STS/SVM Guidelines for the Diagnosis and Management of Patients with Thoracic Aortic Disease. Circulation. 2010; 121: B449-Q759 Aortic aneurysm NOS (ICD10-I71.9). Electronically Signed   By: Lavonia Dana M.D.   On: 05/07/2017 10:49    Procedures Procedures (including critical care time)  Medications Ordered in ED Medications  ipratropium-albuterol (DUONEB) 0.5-2.5 (3) MG/3ML nebulizer solution 3 mL (has no administration in time range)     Initial Impression / Assessment and Plan / ED Course  I have reviewed the triage vital signs and the nursing notes.  Pertinent labs & imaging results that were available during my care of the patient were reviewed by me and considered in my medical decision making (see chart for details).  Clinical Course as of May 08 1313  Sun May 07, 3332  2678 62 year old male with no prior history of significant pulmonary disease here with 1 months worth of upper respiratory symptoms and shortness of breath with moderate to high fevers.  He is already completed a course of antibiotics and steroids.  He continues to worsen despite this treatment.  Differential includes bronchopneumonia, viral illness, reactive airway disease, PE, myocardial ischemia.   [MB]  [id number] Chest x-ray concerning for multifocal pneumonia.  Antibiotics ordered.   [MB]  1638 Discussed with the tried hospitalist who will accept to their service.  They understand the reading of the CT PE is pending.  Patient is updated on plan.   [MB]    Clinical Course User Index [MB] Hayden Rasmussen, MD     Final Clinical Impressions(s) / ED Diagnoses   Final diagnoses:  Multifocal pneumonia    ED Discharge Orders    None       Hayden Rasmussen, MD 05/08/17 1316

## 2017-05-07 NOTE — H&P (Addendum)
[facility]  History and Physical Timothy Page L. Timothy Perfect, MD Pager [phone] (if 7P to 7A, page night hospitalist on [email or web address] Page [id number]:[id number] DOB: Jul 16, 1955 DOA: 05/07/2017  Referring physician: ED  PCP: Timothy Sou, MD   Chief Complaint: fever, cough, malaise   HPI:  Pt who has felt ill since "february". He was treated by PCP outpatient until his BP lead him to ED on 3/20 .   CXR in ED on 3/20 showed no PNE . However, CXR today showed B/L infiltrates and CTA PE ordered w/ report pending at this time. AF on last check in ED, however reports temp max at 104F on 3/20 and ranging 100-102F in b/w dosing of ibuprofen 800mg  at home over the past 3 days . This is persistant for past 4 days . He reports use of azithromycin and prednisone about 1 week prior . During this treatment his BP maxed at 099I systolic, and he has concern this was from his prednisone, wishing to avoid further prednisone if able. Of note, he reports being checked for the flu "six" times over past 2 months and always negative.   Symtoms currently are cough , feeling achy and malaise , fevers .  62 year old is present in his home and is having cough as well .  Chart Review:  ED labs, notes , images   Review of Systems:  POS for cough, malaise, fever   Past Medical History:  Diagnosis Date  . Arthritis   . Diabetes mellitus without complication ([facility])   . History of thyroidectomy, total 2018   Goiter.  [name] Hx of adenomatous polyp of colon 07/02/2009   As of 09/2016, pt is aware he is overdue for repeat colonoscopy---he knows to call [facility] to set this up.  . Hyperlipidemia    Intol of multiple statins--offered zetia trial 04/2017.  [name] Hypertension   . Lipoma    back  . 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  . Seasonal allergies   . Tachyarrhythmia 1980   age 62 X 2 over 77 month period.  No recurrence since that time.  . Vocal cord paralysis 07/2016   Left vocal  cord paralysis noted after thyroid surgery.    Past Surgical History:  Procedure Laterality Date  . CARDIAC CATHETERIZATION  1980  . COLONOSCOPY W/ POLYPECTOMY  07/02/2009   tubular adenoma (recall 5 yrs; pt aware he is overdue as of 09/2016 and knows to call [facility] to set this up.  . ESOPHAGOGASTRODUODENOSCOPY  07/02/2009   NORMAL  . EYE SURGERY    . finger amputaion     with reattachement  . LASIK    . THYROIDECTOMY N/A 08/03/2016   Procedure: Total THYROIDECTOMY;  Surgeon: Timothy Gala, MD;  Location: [facility];  Service: ENT;  Laterality: N/A;  Total Thyroidectomy     Social History:  reports that he has never smoked. His smokeless tobacco use includes snuff. He reports that he does not drink alcohol or use drugs.  Allergies  Allergen Reactions  . Tenormin [Atenolol]     Severe "slowing"of functioning  . Tetracycline     Rash Because of a history of documented adverse serious drug reaction;Medi Alert bracelet  is recommended  . Lipitor [Atorvastatin]     D/Ced by him due to Myalgias in Sept 2014  . Guaifenesin     Other reaction(s): Other (See Comments) unknown    Family History  Problem Relation Age of Onset  .  Diabetes Mother   . Cancer Mother        lung  . COPD Mother   . Brain cancer Mother   . Arthritis Mother   . Hyperlipidemia Mother   . Heart disease Mother   . Hypertension Mother   . Diabetes Father   . Cancer Father        mesothelioma; skin  . Mental illness Father   . Arthritis Father   . Hyperlipidemia Father   . Heart disease Father   . Hypertension Father   . Heart disease Maternal Grandmother        MI @ 82  . Heart disease Maternal Grandfather        MI @ 6  . Cancer Paternal Grandmother   . Diabetes Paternal Grandmother   . Heart disease Paternal Grandfather        MI @ 77  . Thyroid cancer Maternal Aunt      Prior to Admission medications   Medication Sig Start Date End Date Taking? Authorizing Provider  ALPRAZolam (XANAX) 0.25  MG tablet Take 1 tablet (0.25 mg total) by mouth 2 (two) times daily as needed for anxiety. 08/05/16   Timothy Page, Timothy Huguenin, MD  amLODipine (NORVASC) 5 MG tablet Take 1 tablet (5 mg total) by mouth 2 (two) times daily. 09/15/16   Timothy Page, Timothy Blackwater, MD  aspirin 81 MG tablet Take 81 mg by mouth daily.    [provider]  azithromycin (ZITHROMAX) 250 MG tablet 2 tabs po qd x 1d, then 1 tab po qd x 4d 04/21/17   Timothy Page, Timothy Blackwater, MD  benzonatate (TESSALON) 100 MG capsule Take 1 capsule (100 mg total) by mouth every 8 (eight) hours. 05/03/17   Timothy Page, Timothy Kirks, PA-C  cholecalciferol (VITAMIN D) 1000 units tablet Take 1,000 Units by mouth daily.    [provider]  cloNIDine (CATAPRES) 0.3 MG tablet Take 1 tablet (0.3 mg total) by mouth 2 (two) times daily. 12/16/16   Timothy Page, Timothy Blackwater, MD  ezetimibe (ZETIA) 10 MG tablet Take 1 tablet (10 mg total) by mouth daily. 04/25/17   Timothy Page, Timothy Blackwater, MD  ibuprofen (ADVIL,MOTRIN) 800 MG tablet Take 1 tablet (800 mg total) by mouth 3 (three) times daily. 05/03/17   Timothy Page, Timothy Kirks, PA-C  levothyroxine (SYNTHROID, LEVOTHROID) 137 MCG tablet TAKE 1 TABLET BY MOUTH  DAILY BEFORE BREAKFAST 04/12/17   Timothy Snare, MD  metFORMIN (GLUCOPHAGE) 500 MG tablet TAKE 1 TABLET BY MOUTH TWO  TIMES DAILY WITH A MEAL 04/12/17   Timothy Page, Timothy Blackwater, MD  predniSONE (DELTASONE) 20 MG tablet 2 tabs po qd x 5d, then 1 tab po qd x 5d 04/21/17   Timothy Page, Timothy Blackwater, MD  ramipril (ALTACE) 10 MG capsule 1 cap po bid Patient taking differently: Take 10 mg by mouth 2 (two) times daily.  09/15/16   Timothy Page, Timothy Blackwater, MD  senna-docusate (SENOKOT-S) 8.6-50 MG tablet Take 1 tablet by mouth 2 (two) times daily. 08/05/16   Timothy Page, Timothy Huguenin, MD   Physical Exam: Vitals:   05/07/17 2595 05/07/17 0638  BP:  (!) 149/95  Pulse:  97  Resp:  18  Temp:  98.3 F (36.8 C)  TempSrc:  Oral  SpO2: 90% 92%     General:  WM who appears moderately ill today .   HEENT: injected conjunctivae b/l .  MMM .   Cardiovascular: RRR, no MRG , no edema in B/L LE   Respiratory: CTAB, crackles in based b/l .  Good air movement throughout   Abdomen: soft, NT, no distension   Skin: dry , warm   Musculoskeletal: no focal weakness   Psychiatric: anxious about illness but otherwise no depression   Neurologic: no focal deficits   Wt Readings from Last 3 Encounters:  04/21/17 105.9 kg (233 lb 8 oz)  12/15/16 101.4 kg (223 lb 8 oz)  11/14/16 100.7 kg (222 lb)    Labs on Admission:  Basic Metabolic Panel: Recent Labs  Lab 05/03/17 2100 05/07/17 0649  NA 139 136  K 4.0 3.8  CL 105 103  CO2 25 22  GLUCOSE 147* 163*  BUN 8 14  CREATININE 1.12 1.38*  CALCIUM 8.9 8.1*   Liver Function Tests: Recent Labs  Lab 05/03/17 2100  AST 21  ALT 21  ALKPHOS 64  BILITOT 0.8  PROT 6.7  ALBUMIN 4.1   No results for input(s): LIPASE, AMYLASE in the last 168 hours. No results for input(s): AMMONIA in the last 168 hours. CBC: Recent Labs  Lab 05/03/17 2100 05/07/17 0649  WBC 9.9 4.1  NEUTROABS 8.8*  --   HGB 14.5 14.2  HCT 41.2 41.0  MCV 90.2 90.1  PLT 188 155   Cardiac Enzymes: No results for input(s): CKTOTAL, CKMB, CKMBINDEX, TROPONINI in the last 168 hours.  BNP (last 3 results) Recent Labs    05/07/17 0808  BNP 6.9    ProBNP (last 3 results) No results for input(s): PROBNP in the last 8760 hours.  CBG: No results for input(s): GLUCAP in the last 168 hours.  Radiological Exams on Admission: Dg Chest 2 View  Result Date: 05/07/2017 CLINICAL DATA:  Shortness of breast since February when he was diagnosed with the flu, breathing difficulty especially with exertion or lying down EXAM: CHEST - 2 VIEW COMPARISON:  05/03/2017 FINDINGS: Enlargement of cardiac silhouette. Mediastinal contours and pulmonary vascularity normal. Patchy infiltrates lower LEFT lung increased. Question minimal infiltrate in RIGHT mid lung. No pleural effusion or pneumothorax. Multilevel endplate  spur formation thoracic spine. Prominent RIGHT first costochondral junction stable. IMPRESSION: Increased LEFT basilar infiltrate question pneumonia. Question minimal RIGHT mid lung infiltrate. Electronically Signed   By: Lavonia Dana M.D.   On: 05/07/2017 07:30   Ct Angio Chest Pe W/cm &/or Wo Cm  Result Date: 05/07/2017 CLINICAL DATA:  Shortness of breath especially with exertion and lying down, recent flu, history diabetes mellitus, hypertension EXAM: CT ANGIOGRAPHY CHEST WITH CONTRAST TECHNIQUE: Multidetector CT imaging of the chest was performed using the standard protocol during bolus administration of intravenous contrast. Multiplanar CT image reconstructions and MIPs were obtained to evaluate the vascular anatomy. CONTRAST:  39mL ISOVUE-370 IOPAMIDOL (ISOVUE-370) INJECTION 76% IV COMPARISON:  None FINDINGS: Cardiovascular: Aneurysmal dilatation of the ascending thoracic aorta 4.1 cm diameter image 37. No aortic dissection. Pulmonary arteries adequately opacified and patent. No evidence of pulmonary embolism. No pericardial effusion. Mediastinum/Nodes: Esophagus unremarkable. Base of cervical region normal appearance. No thoracic adenopathy. Lungs/Pleura: Airspace infiltrates throughout LEFT lower lobe with less in LEFT upper lobe/lingula and RIGHT upper lobe consistent with pneumonia. Some of the observed infiltrates are somewhat nodular in appearance especially in RIGHT lung where a focal 12 mm opacity image 39 and 19 mm opacity image 43 in the mid lung are identified. No pleural effusion or pneumothorax. Upper Abdomen: Visualized upper abdomen unremarkable Musculoskeletal: No acute osseous findings. Review of the MIP images confirms the above findings. IMPRESSION: No evidence of pulmonary embolism. Extensive patchy BILATERAL pulmonary infiltrates greater on LEFT consistent with  multifocal lung pneumonia. Since some of these areas of consolidation are nodular in configuration, recommend follow-up  noncontrast CT imaging in 3 months to ensure resolution and exclude underlying pulmonary mass/nodule. 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 Recommend annual imaging followup by CTA or MRA. This recommendation follows 2010 ACCF/AHA/AATS/ACR/ASA/SCA/SCAI/SIR/STS/SVM Guidelines for the Diagnosis and Management of Patients with Thoracic Aortic Disease. Circulation. 2010; 121: A834-H962 Aortic aneurysm NOS (ICD10-I71.9). Electronically Signed   By: Lavonia Dana M.D.   On: 05/07/2017 10:49    EKG: Independently reviewed.   Active Problems:   Community acquired pneumonia   Assessment/Plan 1. CAP  , B/L - no recent hospital admission hx . Does not live in SNF . Treat w/ rocephin and azithromycin. ibuprofen 800 tid prn for fever . Per his request, benadryl liquid prn for cough (he has hx of using anaplex HDR liquid, antihistamine/pseudophed combo) . Also tesssalon prn . CANNOT USE CODEINE . Hydrate w/ NS 100cc/hr for 12 hours s/p CTA scan on metformin . CTA confirmed PNE w/o PE  , no signs of VTE on exam  2. HTN - resume home meds , giving clonidine now and all other starting w/ PM dose. Verified the BID dosing protocol of his HTN meds at bedside to ensure accuracy  3. DM - contrast load during CTA. IVF and holding metformin for 48 hours . SSI protocol initiated until metformin resumed  4. HLD - home zetia ordered  5. Hypothyroidism - starting levothyroxine home dose now. Confirmed missed AM dose at home. Resume daily tomorrow 6. AAA - 4.1cm noted incidentally on CTA chest . F/u likely ok as outpatient w/ MRA/CTA  7. Lung nodule - noted incidentally on CTA chest .  ok to repeat CT in 3 mo outpatient   Code Status: full Family Communication: wife at bedside  Disposition Plan/Anticipated LOS: 2-3 days after IV abx and resolution of fever   Time spent: 45  minutes  Velna Hatchet, MD  Internal Medicine Pager [phone] Cell [phone] * DO NOT [place] *  If 7PM-7AM, please contact night-coverage at [email or web address], password [facility] 05/07/2017, 11:09 AM

## 2017-05-07 NOTE — ED Notes (Signed)
PAGED ADMITTING TO RN Joellen Jersey

## 2017-05-07 NOTE — ED Triage Notes (Signed)
Pt reports SOB since February when he was diagnosed w/ the flu.  Pt continues to experience issues w/ breathing,especially when he exerts himself or trys to lie down.  He was 88% when EMS arrived today, placed on 4lt, increased to 90%.  Was given 500NC.  Lungs are clear

## 2017-05-07 NOTE — ED Notes (Signed)
Patient transported to CT 

## 2017-05-08 ENCOUNTER — Encounter (HOSPITAL_COMMUNITY): Payer: Self-pay | Admitting: General Practice

## 2017-05-08 ENCOUNTER — Other Ambulatory Visit: Payer: Self-pay

## 2017-05-08 DIAGNOSIS — I159 Secondary hypertension, unspecified: Secondary | ICD-10-CM

## 2017-05-08 DIAGNOSIS — E119 Type 2 diabetes mellitus without complications: Secondary | ICD-10-CM

## 2017-05-08 DIAGNOSIS — J189 Pneumonia, unspecified organism: Principal | ICD-10-CM

## 2017-05-08 LAB — CBC
HEMATOCRIT: 37.7 % — AB (ref 39.0–52.0)
HEMOGLOBIN: 12.8 g/dL — AB (ref 13.0–17.0)
MCH: 30.7 pg (ref 26.0–34.0)
MCHC: 34 g/dL (ref 30.0–36.0)
MCV: 90.4 fL (ref 78.0–100.0)
Platelets: 140 10*3/uL — ABNORMAL LOW (ref 150–400)
RBC: 4.17 MIL/uL — ABNORMAL LOW (ref 4.22–5.81)
RDW: 13.9 % (ref 11.5–15.5)
WBC: 3.4 10*3/uL — ABNORMAL LOW (ref 4.0–10.5)

## 2017-05-08 LAB — COMPREHENSIVE METABOLIC PANEL
ALBUMIN: 3.3 g/dL — AB (ref 3.5–5.0)
ALT: 23 U/L (ref 17–63)
ANION GAP: 8 (ref 5–15)
AST: 36 U/L (ref 15–41)
Alkaline Phosphatase: 49 U/L (ref 38–126)
BUN: 8 mg/dL (ref 6–20)
CO2: 23 mmol/L (ref 22–32)
Calcium: 8.2 mg/dL — ABNORMAL LOW (ref 8.9–10.3)
Chloride: 108 mmol/L (ref 101–111)
Creatinine, Ser: 1.09 mg/dL (ref 0.61–1.24)
GFR calc Af Amer: >60 mL/min (ref >60)
GFR calc non Af Amer: >60 mL/min (ref >60)
GLUCOSE: 132 mg/dL — AB (ref 65–99)
POTASSIUM: 3.6 mmol/L (ref 3.5–5.1)
SODIUM: 139 mmol/L (ref 135–145)
Total Bilirubin: 0.7 mg/dL (ref 0.3–1.2)
Total Protein: 5.8 g/dL — ABNORMAL LOW (ref 6.5–8.1)

## 2017-05-08 LAB — GLUCOSE, CAPILLARY
Glucose-Capillary: 104 mg/dL — ABNORMAL HIGH (ref 65–99)
Glucose-Capillary: 140 mg/dL — ABNORMAL HIGH (ref 65–99)
Glucose-Capillary: 123 mg/dL — ABNORMAL HIGH (ref 65–99)
Glucose-Capillary: 154 mg/dL — ABNORMAL HIGH (ref 65–99)

## 2017-05-08 LAB — STREP PNEUMONIAE URINARY ANTIGEN: Strep Pneumo Urinary Antigen: NEGATIVE

## 2017-05-08 LAB — EXPECTORATED SPUTUM ASSESSMENT W GRAM STAIN, RFLX TO RESP C

## 2017-05-08 LAB — EXPECTORATED SPUTUM ASSESSMENT W REFEX TO RESP CULTURE

## 2017-05-08 LAB — HIV ANTIBODY (ROUTINE TESTING W REFLEX): HIV Screen 4th Generation wRfx: NONREACTIVE

## 2017-05-08 MED ORDER — DEXTROMETHORPHAN POLISTIREX ER 30 MG/5ML PO SUER
30.0000 mg | Freq: Two times a day (BID) | ORAL | Status: DC | PRN
  Administered 2017-05-08 (×2): 30 mg via ORAL
  Filled 2017-05-08 (×3): qty 5

## 2017-05-08 NOTE — Progress Notes (Signed)
PROGRESS NOTE    [facility]  QXI:[id number] DOB: December 13, 1955 DOA: 05/07/2017 PCP: Tammi Sou, MD   Outpatient Specialists:     Brief Narrative:  Pt who has felt ill since "february". He was treated by PCP outpatient until his BP lead him to ED on 3/20 .   CXR in ED on 3/20 showed no PNE . However, CXR today showed B/L infiltrates and CTA PE ordered w/ report pending at this time. AF on last check in ED, however reports temp max at 104F on 3/20 and ranging 100-102F in b/w dosing of ibuprofen 800mg  at home over the past 3 days . This is persistant for past 4 days . He reports use of azithromycin and prednisone about 1 week prior . During this treatment his BP maxed at 034J systolic, and he has concern this was from his prednisone, wishing to avoid further prednisone if able. Of note, he reports being checked for the flu "six" times over past 2 months and always negative.      Assessment & Plan:   Active Problems:   Controlled type 2 diabetes mellitus without complication, without long-term current use of insulin ([place])   Community acquired pneumonia   CAP  , B/L  With acute respiratory failure  -no recent hospital admission hx -rocephin and azithromycin -still requiring 2L O2-- wean as tolerated  HTN -  -resume home meds  DM -  -SSI -resume metformin in 48 hours (had contrast)  HLD - home zetia ordered   Hypothyroidism  -resume home dose  AAA - 4.1cm noted incidentally on CTA chest  -outpatient follow up in 1 year  Lung nodule - noted incidentally on CTA chest -outpatient follow up with CT once PNA treated     DVT prophylaxis:    Code Status: Full Code   Family Communication: At bedside  Disposition Plan:  Home in AM   Consultants:      Subjective: C/o dry cough  Objective: Vitals:   05/07/17 1138 05/07/17 1200 05/07/17 2203 05/08/17 0447  BP: (!) 160/94 140/82 118/75 131/69  Pulse: 97 83 61 (!) 58  Resp: (!) 24 (!) 22 17 17   Temp:   99 F (37.2 C) 97.6 F (36.4 C) 98.3 F (36.8 C)  TempSrc:  Oral Oral Oral  SpO2: 91% 94% 95% 94%  Weight:  100.7 kg (222 lb)    Height:  5\' 11"  (1.803 m)      Intake/Output Summary (Last 24 hours) at 05/08/2017 1144 Last data filed at 05/08/2017 1110 Gross per 24 hour  Intake 1590.67 ml  Output 1675 ml  Net -84.33 ml   Filed Weights   05/07/17 1200  Weight: 100.7 kg (222 lb)    Examination:  General exam: on 3L O2 Respiratory system: no wheezing, coarse breath sounds b/l bases Cardiovascular system: S1 & S2 heard, RRR. No JVD, murmurs, rubs, gallops or clicks. No pedal edema. Gastrointestinal system: Abdomen is nondistended, soft and nontender. No organomegaly or masses felt. Normal bowel sounds heard. Central nervous system: Alert and oriented. No focal neurological deficits. Extremities: Symmetric 5 x 5 power. Skin: No rashes, lesions or ulcers Psychiatry: flat affect    Data Reviewed: I have personally reviewed following labs and imaging studies  CBC: Recent Labs  Lab 05/03/17 2100 05/07/17 0649 05/07/17 1506 05/08/17 0513  WBC 9.9 4.1 2.7* 3.4*  NEUTROABS 8.8*  --   --   --   HGB 14.5 14.2 13.6 12.8*  HCT 41.2 41.0 39.3  37.7*  MCV 90.2 90.1 89.5 90.4  PLT 188 155 140* 220*   Basic Metabolic Panel: Recent Labs  Lab 05/03/17 2100 05/07/17 0649 05/07/17 1506 05/08/17 0513  NA 139 136  --  139  K 4.0 3.8  --  3.6  CL 105 103  --  108  CO2 25 22  --  23  GLUCOSE 147* 163*  --  132*  BUN 8 14  --  8  CREATININE 1.12 1.38* 1.18 1.09  CALCIUM 8.9 8.1*  --  8.2*   GFR: Estimated Creatinine Clearance: 87.2 mL/min (by C-G formula based on SCr of 1.09 mg/dL). Liver Function Tests: Recent Labs  Lab 05/03/17 2100 05/08/17 0513  AST 21 36  ALT 21 23  ALKPHOS 64 49  BILITOT 0.8 0.7  PROT 6.7 5.8*  ALBUMIN 4.1 3.3*   No results for input(s): LIPASE, AMYLASE in the last 168 hours. No results for input(s): AMMONIA in the last 168 hours. Coagulation  Profile: No results for input(s): INR, PROTIME in the last 168 hours. Cardiac Enzymes: No results for input(s): CKTOTAL, CKMB, CKMBINDEX, TROPONINI in the last 168 hours. BNP (last 3 results) No results for input(s): PROBNP in the last 8760 hours. HbA1C: Recent Labs    05/07/17 1506  HGBA1C 6.5*   CBG: Recent Labs  Lab 05/07/17 1314 05/07/17 1637 05/07/17 2156 05/08/17 0758  GLUCAP 125* 178* 132* 104*   Lipid Profile: No results for input(s): CHOL, HDL, LDLCALC, TRIG, CHOLHDL, LDLDIRECT in the last 72 hours. Thyroid Function Tests: No results for input(s): TSH, T4TOTAL, FREET4, T3FREE, THYROIDAB in the last 72 hours. Anemia Panel: No results for input(s): VITAMINB12, FOLATE, FERRITIN, TIBC, IRON, RETICCTPCT in the last 72 hours. Urine analysis:    Component Value Date/Time   COLORURINE STRAW (A) 05/03/2017 2132   APPEARANCEUR CLEAR 05/03/2017 2132   LABSPEC 1.008 05/03/2017 2132   PHURINE 6.0 05/03/2017 2132   GLUCOSEU NEGATIVE 05/03/2017 2132   GLUCOSEU NEGATIVE 09/13/2016 0946   HGBUR SMALL (A) 05/03/2017 2132   HGBUR negative 05/05/2009 1323   BILIRUBINUR NEGATIVE 05/03/2017 2132   KETONESUR NEGATIVE 05/03/2017 2132   PROTEINUR NEGATIVE 05/03/2017 2132   UROBILINOGEN 0.2 09/13/2016 0946   NITRITE NEGATIVE 05/03/2017 2132   LEUKOCYTESUR NEGATIVE 05/03/2017 2132     )No results found for this or any previous visit (from the past 240 hour(s)).    Anti-infectives (From admission, onward)   Start     Dose/Rate Route Frequency Ordered Stop   05/08/17 1100  cefTRIAXone (ROCEPHIN) 1 g in sodium chloride 0.9 % 100 mL IVPB     1 g 200 mL/hr over 30 Minutes Intravenous Every 24 hours 05/07/17 1456 05/15/17 1059   05/08/17 1100  azithromycin (ZITHROMAX) tablet 500 mg     500 mg Oral Every 24 hours 05/07/17 1456 05/15/17 1059   05/07/17 0745  cefTRIAXone (ROCEPHIN) 1 g in sodium chloride 0.9 % 100 mL IVPB     1 g 200 mL/hr over 30 Minutes Intravenous  Once 05/07/17  0735 05/07/17 1002   05/07/17 0745  azithromycin (ZITHROMAX) tablet 500 mg     500 mg Oral  Once 05/07/17 0735 05/07/17 0820       Radiology Studies: Dg Chest 2 View  Result Date: 05/07/2017 CLINICAL DATA:  Shortness of breast since February when he was diagnosed with the flu, breathing difficulty especially with exertion or lying down EXAM: CHEST - 2 VIEW COMPARISON:  05/03/2017 FINDINGS: Enlargement of cardiac silhouette. Mediastinal contours and  pulmonary vascularity normal. Patchy infiltrates lower LEFT lung increased. Question minimal infiltrate in RIGHT mid lung. No pleural effusion or pneumothorax. Multilevel endplate spur formation thoracic spine. Prominent RIGHT first costochondral junction stable. IMPRESSION: Increased LEFT basilar infiltrate question pneumonia. Question minimal RIGHT mid lung infiltrate. Electronically Signed   By: Lavonia Dana M.D.   On: 05/07/2017 07:30   Ct Angio Chest Pe W/cm &/or Wo Cm  Result Date: 05/07/2017 CLINICAL DATA:  Shortness of breath especially with exertion and lying down, recent flu, history diabetes mellitus, hypertension EXAM: CT ANGIOGRAPHY CHEST WITH CONTRAST TECHNIQUE: Multidetector CT imaging of the chest was performed using the standard protocol during bolus administration of intravenous contrast. Multiplanar CT image reconstructions and MIPs were obtained to evaluate the vascular anatomy. CONTRAST:  50mL ISOVUE-370 IOPAMIDOL (ISOVUE-370) INJECTION 76% IV COMPARISON:  None FINDINGS: Cardiovascular: Aneurysmal dilatation of the ascending thoracic aorta 4.1 cm diameter image 37. No aortic dissection. Pulmonary arteries adequately opacified and patent. No evidence of pulmonary embolism. No pericardial effusion. Mediastinum/Nodes: Esophagus unremarkable. Base of cervical region normal appearance. No thoracic adenopathy. Lungs/Pleura: Airspace infiltrates throughout LEFT lower lobe with less in LEFT upper lobe/lingula and RIGHT upper lobe consistent with  pneumonia. Some of the observed infiltrates are somewhat nodular in appearance especially in RIGHT lung where a focal 12 mm opacity image 39 and 19 mm opacity image 43 in the mid lung are identified. No pleural effusion or pneumothorax. Upper Abdomen: Visualized upper abdomen unremarkable Musculoskeletal: No acute osseous findings. Review of the MIP images confirms the above findings. IMPRESSION: No evidence of pulmonary embolism. Extensive patchy BILATERAL pulmonary infiltrates greater on LEFT consistent with multifocal lung pneumonia. Since some of these areas of consolidation are nodular in configuration, recommend follow-up noncontrast CT imaging in 3 months to ensure resolution and exclude underlying pulmonary mass/nodule. 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 Recommend annual imaging followup by CTA or MRA. This recommendation follows 2010 ACCF/AHA/AATS/ACR/ASA/SCA/SCAI/SIR/STS/SVM Guidelines for the Diagnosis and Management of Patients with Thoracic Aortic Disease. Circulation. 2010; 121: I627-O350 Aortic aneurysm NOS (ICD10-I71.9). Electronically Signed   By: Lavonia Dana M.D.   On: 05/07/2017 10:49        Scheduled Meds: . amLODipine  5 mg Oral BID  . aspirin  81 mg Oral Daily  . azithromycin  500 mg Oral Q24H  . benzonatate  100 mg Oral Q8H  . cholecalciferol  1,000 Units Oral Daily  . cloNIDine  0.3 mg Oral BID  . enoxaparin (LOVENOX) injection  40 mg Subcutaneous Q24H  . ezetimibe  10 mg Oral Daily  . insulin aspart  0-15 Units Subcutaneous TID WC  . levothyroxine  137 mcg Oral QAC breakfast  . metFORMIN  500 mg Oral BID WC  . ramipril  10 mg Oral BID  . senna-docusate  1 tablet Oral BID   Continuous Infusions: . cefTRIAXone (ROCEPHIN)  IV Stopped (05/08/17 1120)     LOS: 1 day    Time spent: 35 min    Geradine Girt, DO [facility] Pager [phone]  If 7PM-7AM, please contact  night-coverage [email or web address] Password [facility] 05/08/2017, 11:44 AM

## 2017-05-09 ENCOUNTER — Telehealth: Payer: Self-pay | Admitting: Family Medicine

## 2017-05-09 LAB — BASIC METABOLIC PANEL
ANION GAP: 10 (ref 5–15)
BUN: 7 mg/dL (ref 6–20)
CO2: 25 mmol/L (ref 22–32)
Calcium: 8.5 mg/dL — ABNORMAL LOW (ref 8.9–10.3)
Chloride: 107 mmol/L (ref 101–111)
Creatinine, Ser: 1.09 mg/dL (ref 0.61–1.24)
GLUCOSE: 112 mg/dL — AB (ref 65–99)
POTASSIUM: 4.1 mmol/L (ref 3.5–5.1)
Sodium: 142 mmol/L (ref 135–145)

## 2017-05-09 LAB — GLUCOSE, CAPILLARY: GLUCOSE-CAPILLARY: 127 mg/dL — AB (ref 65–99)

## 2017-05-09 LAB — CBC
HCT: 37.5 % — ABNORMAL LOW (ref 39.0–52.0)
Hemoglobin: 12.6 g/dL — ABNORMAL LOW (ref 13.0–17.0)
MCH: 30.1 pg (ref 26.0–34.0)
MCHC: 33.6 g/dL (ref 30.0–36.0)
MCV: 89.5 fL (ref 78.0–100.0)
PLATELETS: 138 10*3/uL — AB (ref 150–400)
RBC: 4.19 MIL/uL — AB (ref 4.22–5.81)
RDW: 13.7 % (ref 11.5–15.5)
WBC: 4.2 10*3/uL (ref 4.0–10.5)

## 2017-05-09 MED ORDER — LEVOFLOXACIN 750 MG PO TABS: 750.0000 mg | ORAL_TABLET | Freq: Every day | ORAL | 0 refills | Status: DC

## 2017-05-09 MED ORDER — LEVOFLOXACIN 750 MG PO TABS
750.0000 mg | ORAL_TABLET | Freq: Every day | ORAL | Status: DC
  Administered 2017-05-09: 750 mg via ORAL
  Filled 2017-05-09: qty 1

## 2017-05-09 MED ORDER — IPRATROPIUM-ALBUTEROL 0.5-2.5 (3) MG/3ML IN SOLN: 3.0000 mL | Freq: Four times a day (QID) | RESPIRATORY_TRACT | Status: DC | PRN

## 2017-05-09 MED ORDER — OXYMETAZOLINE HCL 0.05 % NA SOLN
1.0000 | Freq: Two times a day (BID) | NASAL | Status: DC
  Filled 2017-05-09: qty 15

## 2017-05-09 MED ORDER — IBUPROFEN 800 MG PO TABS: 800.0000 mg | ORAL_TABLET | Freq: Four times a day (QID) | ORAL | 0 refills | Status: DC | PRN

## 2017-05-09 NOTE — Discharge Summary (Signed)
Physician Discharge Summary  Timothy Page:[id number] DOB: February 03, 1956 DOA: 05/07/2017  PCP: Tammi Sou, MD  Admit date: 05/07/2017 Discharge date: 05/09/2017   Recommendations for Outpatient Follow-Up:   1. Repeat CT scan for lung nodule once PNA treated 2. Outpatient follow up in 1 years for AAA   Discharge Diagnosis:   Active Problems:   Controlled type 2 diabetes mellitus without complication, without long-term current use of insulin ([place])   Community acquired pneumonia   Discharge disposition:  Home:  Discharge Condition: Improved.  Diet recommendation: Low sodium, heart healthy.  Carbohydrate-modified.  Wound care: None.   History of Present Illness:   Pt who has felt ill since "february". He was treated by PCP outpatient until his BP lead him to ED on 3/20 . CXR in ED on 3/20 showed no PNE . However, CXR today showed B/L infiltrates and CTA PE ordered w/ report pending at this time. AF on last check in ED, however reports temp max at 104F on 3/20 and ranging 100-102F in b/w dosing of ibuprofen 800mg  at home over the past 3 days . This is persistant for past 4 days . He reports use of azithromycin and prednisone about 1 week prior . During this treatment his BP maxed at 601U systolic, and he has concern this was from his prednisone, wishing to avoid further prednisone if able. Of note, he reports being checked for the flu "six" times over past 2 months and always negative.      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   Discharge Exam:    Vitals:   05/09/17 0442 05/09/17 0800 05/09/17 0858   BP: 134/82 134/86    Pulse: 61 65    Resp: 19     Temp: 98.2 F (36.8 C)     TempSrc: Oral     SpO2: 91% 93% 95%   Weight:      Height:        Gen:  NAD- patient had brief drop of O2 to 87%, majority of time O2 sats in the 90s-- had emergency at home and needed to leave before I could re-evaluate in the PM   The results of significant diagnostics from this hospitalization (including imaging, microbiology, ancillary and laboratory) are listed below for reference.     Procedures and Diagnostic Studies:   Dg Chest 2 View  Result Date: 05/07/2017 CLINICAL DATA:  Shortness of breast since February when he was diagnosed with the flu, breathing difficulty especially with exertion or lying down EXAM: CHEST - 2 VIEW COMPARISON:  05/03/2017 FINDINGS: Enlargement of cardiac silhouette. Mediastinal contours and pulmonary vascularity normal. Patchy infiltrates lower LEFT lung increased. Question minimal infiltrate in RIGHT mid lung. No pleural effusion or pneumothorax. Multilevel endplate spur formation thoracic spine. Prominent RIGHT first costochondral junction stable. IMPRESSION: Increased LEFT basilar infiltrate question pneumonia. Question minimal RIGHT mid lung infiltrate. Electronically Signed   By:  Lavonia Dana M.D.   On: 05/07/2017 07:30   Ct Angio Chest Pe W/cm &/or Wo Cm  Result Date: 05/07/2017 CLINICAL DATA:  Shortness of breath especially with exertion and lying down, recent flu, history diabetes mellitus, hypertension EXAM: CT ANGIOGRAPHY CHEST WITH CONTRAST TECHNIQUE: Multidetector CT imaging of the chest was performed using the standard protocol during bolus administration of intravenous contrast. Multiplanar CT image reconstructions and MIPs were obtained to evaluate the vascular anatomy. CONTRAST:  27mL ISOVUE-370 IOPAMIDOL  (ISOVUE-370) INJECTION 76% IV COMPARISON:  None FINDINGS: Cardiovascular: Aneurysmal dilatation of the ascending thoracic aorta 4.1 cm diameter image 37. No aortic dissection. Pulmonary arteries adequately opacified and patent. No evidence of pulmonary embolism. No pericardial effusion. Mediastinum/Nodes: Esophagus unremarkable. Base of cervical region normal appearance. No thoracic adenopathy. Lungs/Pleura: Airspace infiltrates throughout LEFT lower lobe with less in LEFT upper lobe/lingula and RIGHT upper lobe consistent with pneumonia. Some of the observed infiltrates are somewhat nodular in appearance especially in RIGHT lung where a focal 12 mm opacity image 39 and 19 mm opacity image 43 in the mid lung are identified. No pleural effusion or pneumothorax. Upper Abdomen: Visualized upper abdomen unremarkable Musculoskeletal: No acute osseous findings. Review of the MIP images confirms the above findings. IMPRESSION: No evidence of pulmonary embolism. Extensive patchy BILATERAL pulmonary infiltrates greater on LEFT consistent with multifocal lung pneumonia. Since some of these areas of consolidation are nodular in configuration, recommend follow-up noncontrast CT imaging in 3 months to ensure resolution and exclude underlying pulmonary mass/nodule. 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 Recommend annual imaging followup by CTA or MRA. This recommendation follows 2010 ACCF/AHA/AATS/ACR/ASA/SCA/SCAI/SIR/STS/SVM Guidelines for the Diagnosis and Management of Patients with Thoracic Aortic Disease. Circulation. 2010; 121: O175-Z025 Aortic aneurysm NOS (ICD10-I71.9). Electronically Signed   By: Lavonia Dana M.D.   On: 05/07/2017 10:49     Labs:   Basic Metabolic Panel: Recent Labs  Lab 05/03/17 2100 05/07/17 0649 05/07/17 1506 05/08/17 0513 05/09/17 0510  NA 139 136  --  139 142  K 4.0 3.8  --  3.6 4.1  CL 105 103  --  108 107  CO2 25 22  --  23 25    GLUCOSE 147* 163*  --  132* 112*  BUN 8 14  --  8 7  CREATININE 1.12 1.38* 1.18 1.09 1.09  CALCIUM 8.9 8.1*  --  8.2* 8.5*   GFR Estimated Creatinine Clearance: 87.2 mL/min (by C-G formula based on SCr of 1.09 mg/dL). Liver Function Tests: Recent Labs  Lab 05/03/17 2100 05/08/17 0513  AST 21 36  ALT 21 23  ALKPHOS 64 49  BILITOT 0.8 0.7  PROT 6.7 5.8*  ALBUMIN 4.1 3.3*   No results for input(s): LIPASE, AMYLASE in the last 168 hours. No results for input(s): AMMONIA in the last 168 hours. Coagulation profile No results for input(s): INR, PROTIME in the last 168 hours.  CBC: Recent Labs  Lab 05/03/17 2100 05/07/17 0649 05/07/17 1506 05/08/17 0513 05/09/17 0510  WBC 9.9 4.1 2.7* 3.4* 4.2  NEUTROABS 8.8*  --   --   --   --   HGB 14.5 14.2 13.6 12.8* 12.6*  HCT 41.2 41.0 39.3 37.7* 37.5*  MCV 90.2 90.1 89.5 90.4 89.5  PLT 188 155 140* 140* 138*   Cardiac Enzymes: No results for input(s): CKTOTAL, CKMB, CKMBINDEX, TROPONINI in the last 168 hours. BNP: Invalid input(s): POCBNP CBG: Recent Labs  Lab 05/08/17 0758 05/08/17 1200 05/08/17 1656 05/08/17  2054 05/09/17 0827  GLUCAP 104* 140* 123* 154* 127*   D-Dimer No results for input(s): DDIMER in the last 72 hours. Hgb A1c Recent Labs    05/07/17 1506  HGBA1C 6.5*   Lipid Profile No results for input(s): CHOL, HDL, LDLCALC, TRIG, CHOLHDL, LDLDIRECT in the last 72 hours. Thyroid function studies No results for input(s): TSH, T4TOTAL, T3FREE, THYROIDAB in the last 72 hours.  Invalid input(s): FREET3 Anemia work up No results for input(s): VITAMINB12, FOLATE, FERRITIN, TIBC, IRON, RETICCTPCT in the last 72 hours. Microbiology Recent Results (from the past 240 hour(s))  Culture, blood (routine x 2)     Status: None (Preliminary result)   Collection Time: 05/07/17  8:23 AM  Result Value Ref Range Status   Specimen Description BLOOD LEFT ARM  Final   Special Requests   Final    BOTTLES DRAWN AEROBIC AND  ANAEROBIC Blood Culture adequate volume   Culture   Final    NO GROWTH 2 DAYS Performed at [facility], [address], [place], [place] [address]    Report Status PENDING  Incomplete  Culture, blood (routine x 2)     Status: None (Preliminary result)   Collection Time: 05/07/17  8:23 AM  Result Value Ref Range Status   Specimen Description BLOOD RIGHT ARM  Final   Special Requests   Final    BOTTLES DRAWN AEROBIC AND ANAEROBIC Blood Culture results may not be optimal due to an excessive volume of blood received in culture bottles   Culture   Final    NO GROWTH 2 DAYS Performed at [facility], [place] [address] [place], [place] [address]    Report Status PENDING  Incomplete  Culture, sputum-assessment     Status: None   Collection Time: 05/08/17 12:26 PM  Result Value Ref Range Status   Specimen Description SPUTUM  Final   Special Requests NONE  Final   Sputum evaluation   Final    THIS SPECIMEN IS ACCEPTABLE FOR SPUTUM CULTURE Performed at [facility], [address], [place], [place] [address]    Report Status 05/08/2017 FINAL  Final  Culture, respiratory (NON-Expectorated)     Status: None (Preliminary result)   Collection Time: 05/08/17 12:26 PM  Result Value Ref Range Status   Specimen Description SPUTUM  Final   Special Requests NONE Reflexed from [id number]  Final   Gram Stain   Final    FEW WBC PRESENT, PREDOMINANTLY MONONUCLEAR RARE SQUAMOUS EPITHELIAL CELLS PRESENT RARE GRAM POSITIVE COCCI IN CLUSTERS    Culture   Final    CULTURE REINCUBATED FOR BETTER GROWTH Performed at [facility], [place] [address] [place], [place] [address]    Report Status PENDING  Incomplete     Discharge Instructions:   Discharge Instructions    Diet - low sodium heart healthy   Complete by:  As directed    Diet Carb Modified   Complete by:  As directed    Discharge instructions   Complete by:  As directed    Will need close outpatient follow up for repeat  scans once pneumonia treated   Increase activity slowly   Complete by:  As directed      Allergies as of 05/09/2017      Reactions   Tenormin [atenolol]    Severe "slowing"of functioning   Tetracycline    Rash Because of a history of documented adverse serious drug reaction;Medi Alert bracelet  is recommended  Lipitor [atorvastatin]    D/Ced by him due to Myalgias in Sept 2014   Codeine Other (See Comments)   Hallucinations. "I see pink elephants"   Guaifenesin    Other reaction(s): Other (See Comments) unknown   Mucinex [guaifenesin Er] Other (See Comments)   Hallucinations "I see pink elephants"      Medication List    TAKE these medications   ALPRAZolam 0.25 MG tablet Commonly known as:  XANAX Take 1 tablet (0.25 mg total) by mouth 2 (two) times daily as needed for anxiety.   amLODipine 5 MG tablet Commonly known as:  NORVASC Take 1 tablet (5 mg total) by mouth 2 (two) times daily.   aspirin 81 MG tablet Take 81 mg by mouth daily.   benzonatate 100 MG capsule Commonly known as:  TESSALON Take 1 capsule (100 mg total) by mouth every 8 (eight) hours.   cholecalciferol 1000 units tablet Commonly known as:  VITAMIN D Take 500 Units by mouth 2 (two) times daily.   cloNIDine 0.3 MG tablet Commonly known as:  CATAPRES Take 1 tablet (0.3 mg total) by mouth 2 (two) times daily.   ezetimibe 10 MG tablet Commonly known as:  ZETIA Take 1 tablet (10 mg total) by mouth daily.   ibuprofen 800 MG tablet Commonly known as:  ADVIL,MOTRIN Take 1 tablet (800 mg total) by mouth every 6 (six) hours as needed for fever. What changed:    when to take this  reasons to take this   levofloxacin 750 MG tablet Commonly known as:  LEVAQUIN Take 1 tablet (750 mg total) by mouth daily.   levothyroxine 137 MCG tablet Commonly known as:  SYNTHROID, LEVOTHROID TAKE 1 TABLET BY MOUTH  DAILY BEFORE BREAKFAST What changed:  See the new instructions.   metFORMIN 500 MG  tablet Commonly known as:  GLUCOPHAGE TAKE 1 TABLET BY MOUTH TWO  TIMES DAILY WITH A MEAL What changed:  See the new instructions.   ramipril 10 MG capsule Commonly known as:  ALTACE 1 cap po bid What changed:    how much to take  how to take this  when to take this  additional instructions   senna-docusate 8.6-50 MG tablet Commonly known as:  Senokot-S Take 1 tablet by mouth 2 (two) times daily.      Follow-up Information    McGowen, Adrian Blackwater, MD Follow up in 1 week(s).   Specialty:  Family Medicine Contact information: [id number] [place] [address] [phone]            Time coordinating discharge: 35 min  Signed:  Geradine Girt   [facility] 05/09/2017, 10:06 AM

## 2017-05-09 NOTE — Telephone Encounter (Signed)
Copied from [place] [id number]. Topic: Quick Communication - Rx Refill/Question >> May 09, 2017  5:10 PM Oliver Pila B wrote: Pt called and asked he has gotten out of the hospital and his bp is in flux, pt is asking if he can up his dose on any of his medications to control his bp, call pt to advise

## 2017-05-09 NOTE — Progress Notes (Signed)
Timothy Page to be D/C'd  per MD order. Discussed with the patient and all questions fully answered.  VSS, Skin clean, dry and intact without evidence of skin break down, no evidence of skin tears noted.  IV catheter discontinued intact. Site without signs and symptoms of complications. Dressing and pressure applied.  An After Visit Summary was printed and given to the patient. Patient received prescription.  D/c education completed with patient/family including follow up instructions, medication list, d/c activities limitations if indicated, with other d/c instructions as indicated by MD - patient able to verbalize understanding, all questions fully answered.   Patient instructed to return to ED, call 911, or call MD for any changes in condition.   Patient to be escorted via [place], and D/C home via private auto.

## 2017-05-09 NOTE — Discharge Instructions (Signed)

## 2017-05-09 NOTE — Progress Notes (Signed)
   05/09/17 0902  Vitals  BP Location Right Arm  Orthostatic Lying   BP- Lying 169/87  Pulse- Lying 73  Orthostatic Sitting  BP- Sitting 166/84  Pulse- Sitting 74  Orthostatic Standing at 0 minutes  BP- Standing at 0 minutes (!) 189/96  Pulse- Standing at 0 minutes 75  Oxygen Therapy  SpO2 (!) 87 %   VS reported to Dr. Eliseo Squires

## 2017-05-10 ENCOUNTER — Encounter (HOSPITAL_COMMUNITY): Payer: Self-pay | Admitting: Emergency Medicine

## 2017-05-10 ENCOUNTER — Ambulatory Visit (INDEPENDENT_AMBULATORY_CARE_PROVIDER_SITE_OTHER): Payer: 59 | Admitting: Family Medicine

## 2017-05-10 ENCOUNTER — Emergency Department (HOSPITAL_COMMUNITY): Payer: 59

## 2017-05-10 ENCOUNTER — Encounter: Payer: Self-pay | Admitting: Family Medicine

## 2017-05-10 ENCOUNTER — Other Ambulatory Visit: Payer: Self-pay

## 2017-05-10 VITALS — BP 152/87 | HR 74 | Temp 97.5°F | Resp 16 | Ht 69.0 in | Wt 219.0 lb

## 2017-05-10 DIAGNOSIS — Z885 Allergy status to narcotic agent status: Secondary | ICD-10-CM | POA: Insufficient documentation

## 2017-05-10 DIAGNOSIS — I10 Essential (primary) hypertension: Secondary | ICD-10-CM | POA: Insufficient documentation

## 2017-05-10 DIAGNOSIS — M1990 Unspecified osteoarthritis, unspecified site: Secondary | ICD-10-CM | POA: Diagnosis not present

## 2017-05-10 DIAGNOSIS — Z7984 Long term (current) use of oral hypoglycemic drugs: Secondary | ICD-10-CM | POA: Insufficient documentation

## 2017-05-10 DIAGNOSIS — E039 Hypothyroidism, unspecified: Secondary | ICD-10-CM | POA: Diagnosis not present

## 2017-05-10 DIAGNOSIS — I714 Abdominal aortic aneurysm, without rupture: Secondary | ICD-10-CM | POA: Diagnosis not present

## 2017-05-10 DIAGNOSIS — Z79899 Other long term (current) drug therapy: Secondary | ICD-10-CM | POA: Diagnosis not present

## 2017-05-10 DIAGNOSIS — Z7982 Long term (current) use of aspirin: Secondary | ICD-10-CM | POA: Insufficient documentation

## 2017-05-10 DIAGNOSIS — F4323 Adjustment disorder with mixed anxiety and depressed mood: Secondary | ICD-10-CM | POA: Diagnosis not present

## 2017-05-10 DIAGNOSIS — E785 Hyperlipidemia, unspecified: Secondary | ICD-10-CM | POA: Insufficient documentation

## 2017-05-10 DIAGNOSIS — E119 Type 2 diabetes mellitus without complications: Secondary | ICD-10-CM | POA: Diagnosis not present

## 2017-05-10 DIAGNOSIS — F17223 Nicotine dependence, chewing tobacco, with withdrawal: Secondary | ICD-10-CM

## 2017-05-10 DIAGNOSIS — J189 Pneumonia, unspecified organism: Secondary | ICD-10-CM | POA: Diagnosis not present

## 2017-05-10 DIAGNOSIS — I712 Thoracic aortic aneurysm, without rupture: Secondary | ICD-10-CM

## 2017-05-10 DIAGNOSIS — Z881 Allergy status to other antibiotic agents status: Secondary | ICD-10-CM | POA: Diagnosis not present

## 2017-05-10 DIAGNOSIS — Z79890 Hormone replacement therapy (postmenopausal): Secondary | ICD-10-CM | POA: Diagnosis not present

## 2017-05-10 DIAGNOSIS — Z8701 Personal history of pneumonia (recurrent): Secondary | ICD-10-CM | POA: Diagnosis not present

## 2017-05-10 DIAGNOSIS — I2510 Atherosclerotic heart disease of native coronary artery without angina pectoris: Secondary | ICD-10-CM | POA: Insufficient documentation

## 2017-05-10 DIAGNOSIS — I252 Old myocardial infarction: Secondary | ICD-10-CM | POA: Diagnosis not present

## 2017-05-10 DIAGNOSIS — F419 Anxiety disorder, unspecified: Secondary | ICD-10-CM | POA: Diagnosis not present

## 2017-05-10 DIAGNOSIS — R079 Chest pain, unspecified: Secondary | ICD-10-CM | POA: Diagnosis not present

## 2017-05-10 DIAGNOSIS — K5909 Other constipation: Secondary | ICD-10-CM | POA: Insufficient documentation

## 2017-05-10 DIAGNOSIS — R0602 Shortness of breath: Secondary | ICD-10-CM | POA: Diagnosis present

## 2017-05-10 LAB — CBC
HCT: 40.1 % (ref 39.0–52.0)
HEMOGLOBIN: 13.6 g/dL (ref 13.0–17.0)
MCH: 30.2 pg (ref 26.0–34.0)
MCHC: 33.9 g/dL (ref 30.0–36.0)
MCV: 89.1 fL (ref 78.0–100.0)
PLATELETS: 205 10*3/uL (ref 150–400)
RBC: 4.5 MIL/uL (ref 4.22–5.81)
RDW: 13.3 % (ref 11.5–15.5)
WBC: 4.6 10*3/uL (ref 4.0–10.5)

## 2017-05-10 LAB — BASIC METABOLIC PANEL
ANION GAP: 11 (ref 5–15)
BUN: 6 mg/dL (ref 6–20)
CALCIUM: 9.2 mg/dL (ref 8.9–10.3)
CO2: 24 mmol/L (ref 22–32)
CREATININE: 1.05 mg/dL (ref 0.61–1.24)
Chloride: 104 mmol/L (ref 101–111)
GFR calc non Af Amer: >60 mL/min (ref >60)
Glucose, Bld: 138 mg/dL — ABNORMAL HIGH (ref 65–99)
Potassium: 3.6 mmol/L (ref 3.5–5.1)
SODIUM: 139 mmol/L (ref 135–145)

## 2017-05-10 LAB — CULTURE, RESPIRATORY: CULTURE: NORMAL

## 2017-05-10 LAB — I-STAT TROPONIN, ED: TROPONIN I, POC: 0.01 ng/mL (ref 0.00–0.08)

## 2017-05-10 LAB — CULTURE, RESPIRATORY W GRAM STAIN

## 2017-05-10 MED ORDER — ALPRAZOLAM 0.25 MG PO TABS: 0.2500 mg | ORAL_TABLET | Freq: Three times a day (TID) | ORAL | 1 refills | Status: DC | PRN

## 2017-05-10 NOTE — Telephone Encounter (Signed)
Call to patient- left message that we were calling to check on his BP- please keep appointment this afternoon.

## 2017-05-10 NOTE — ED Notes (Signed)
Pt to Xray.

## 2017-05-10 NOTE — Progress Notes (Signed)
05/10/2017  CC:  Chief Complaint  Patient presents with  . Hospitalization Follow-up  CP, sweating, weak  Patient is a 62 y.o. Caucasian male who presents for  hospital follow up, specifically [facility] face-to-face visit. Dates hospitalized: 3/24-3/26, 2019. Days since d/c from hospital: 1 Patient was discharged from hospital to home.  Reason for admission to hospital: acute resp failure due to CAP. He states he asked to be d/c'd home b/c he did not want his 43 y/o daughter to be home alone.  He admits he went home too early.  Not eating or drinking well.  Most significantly, he complains of ongoing left sided chest pains and persistently elevated bp. Describes left sided chest pain--"my heart hurts"-for at least the last several days, not necessarily provoked by activity but made worse by it.  He admits that he has been very worried about his bp not coming down (avg 150/95 last couple days at least).  Checks it frequently, sees a high number and gets VERY anxious, breaks out in a sweat, chest pain worsens, face flushes.  No associated jaw or left arm pain, no nausea, no palpitations, no SOB.  He does break out in a sweat.   Of note, he has chewed tobacco all his life but has not had any since being in hospital.  He does feel like some of his current sx's have to do with nicotine w/drawal.  He took an alprazolam 0.25mg  tab last night that was rx'd for him last summer and says "this saved me from absolute panic".  I have reviewed patient's discharge summary plus pertinent specific notes, labs, and imaging from the hospitalization.   Pt was sent home to finish a course of levaquin.   HbA1c in hosp 6.5%.  I don't see any TnI or CK labs from the hospitalization.  Also:  AAA - 4.1cm noted incidentally on CTA chest -outpatient follow up in 1 year  Lung nodule - noted incidentally on CTA chest -outpatient follow up with CT once PNA treated   Medication reconciliation was  done today and patient is taking meds as recommended by discharging hospitalist/specialist.    PMH:  Past Medical History:  Diagnosis Date  . Arthritis   . CAP (community acquired pneumonia) 05/07/2017  . Diabetes mellitus without complication ([place])   . History of thyroidectomy, total 2018   Goiter.  [name] Hx of adenomatous polyp of colon 07/02/2009   As of 09/2016, pt is aware he is overdue for repeat colonoscopy---he knows to call [facility] to set this up.  . Hyperlipidemia    Intol of multiple statins--offered zetia trial 04/2017.  [name] Hypertension   . Lipoma    back  . 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  . Seasonal allergies   . Tachyarrhythmia 1980   age 76 X 2 over 72 month period.  No recurrence since that time.  . Vocal cord paralysis 07/2016   Left vocal cord paralysis noted after thyroid surgery.    PSH:  Past Surgical History:  Procedure Laterality Date  . CARDIAC CATHETERIZATION  1980  . COLONOSCOPY W/ POLYPECTOMY  07/02/2009   tubular adenoma (recall 5 yrs; pt aware he is overdue as of 09/2016 and knows to call [facility] to set this up.  . ESOPHAGOGASTRODUODENOSCOPY  07/02/2009   NORMAL  . EYE SURGERY    . finger amputaion     with reattachement  . LASIK    . THYROIDECTOMY N/A 08/03/2016  Procedure: Total THYROIDECTOMY;  Surgeon: [name], MD;  Location: [facility];  Service: ENT;  Laterality: N/A;  Total Thyroidectomy     MEDS:  Outpatient Medications Prior to Visit  Medication Sig Dispense Refill  . amLODipine (NORVASC) 5 MG tablet Take 1 tablet (5 mg total) by mouth 2 (two) times daily. 180 tablet 3  . aspirin 81 MG tablet Take 81 mg by mouth daily.    . benzonatate (TESSALON) 100 MG capsule Take 1 capsule (100 mg total) by mouth every 8 (eight) hours. 21 capsule 0  . cholecalciferol (VITAMIN D) 1000 units tablet Take 500 Units by mouth 2 (two) times daily.     . cloNIDine (CATAPRES) 0.3 MG tablet Take 1 tablet (0.3 mg total) by  mouth 2 (two) times daily. 180 tablet 1  . ezetimibe (ZETIA) 10 MG tablet Take 1 tablet (10 mg total) by mouth daily. 30 tablet 2  . ibuprofen (ADVIL,MOTRIN) 800 MG tablet Take 1 tablet (800 mg total) by mouth every 6 (six) hours as needed for fever. 30 tablet 0  . levofloxacin (LEVAQUIN) 750 MG tablet Take 1 tablet (750 mg total) by mouth daily. 4 tablet 0  . levothyroxine (SYNTHROID, LEVOTHROID) 137 MCG tablet TAKE 1 TABLET BY MOUTH  DAILY BEFORE BREAKFAST (Patient taking differently: TAKE 1 TABLET (165mcg) BY MOUTH  DAILY BEFORE BREAKFAST) 90 tablet 0  . metFORMIN (GLUCOPHAGE) 500 MG tablet TAKE 1 TABLET BY MOUTH TWO  TIMES DAILY WITH A MEAL (Patient taking differently: TAKE 1 TABLET (500mg )  BY MOUTH TWO  TIMES DAILY WITH A MEAL) 180 tablet 0  . ramipril (ALTACE) 10 MG capsule 1 cap po bid (Patient taking differently: Take 10 mg by mouth 2 (two) times daily. ) 180 capsule 3  . senna-docusate (SENOKOT-S) 8.6-50 MG tablet Take 1 tablet by mouth 2 (two) times daily. 60 tablet 0  . ALPRAZolam (XANAX) 0.25 MG tablet Take 1 tablet (0.25 mg total) by mouth 2 (two) times daily as needed for anxiety. 10 tablet 0   No facility-administered medications prior to visit.   EXAM: BP (!) 152/87 (BP Location: Left Arm, Patient Position: Sitting, Cuff Size: Large)   Pulse 74   Temp (!) 97.5 F (36.4 C) (Oral)   Resp 16   Ht 5\' 9"  (1.753 m)   Wt 219 lb (99.3 kg)   SpO2 96%   BMI 32.34 kg/m  Gen; tired appearing, anxious, NAD.  Marc Morgans himself with a piece of paper. RKY:HCWC: no injection, icteris, swelling, or exudate.  EOMI, PERRLA. Mouth: lips without lesion/swelling.  Oral mucosa pink and moist. Oropharynx without erythema, exudate, or swelling.  CV; RRR, no m/r/g LUNGS: bibasilar insp crackles, no adventitious expiratory sounds, exp phase not prolonged.  Breathing is nonlabored. Abd: soft, NT, ND. EXT: no clubbing, cyanosis, or edema.    Pertinent labs/imaging Lab Results  Component Value Date    TSH 0.70 11/11/2016   Lab Results  Component Value Date   WBC 4.2 05/09/2017   HGB 12.6 (L) 05/09/2017   HCT 37.5 (L) 05/09/2017   MCV 89.5 05/09/2017   PLT 138 (L) 05/09/2017  No results found for: IRON, TIBC, FERRITIN Lab Results  Component Value Date   CREATININE 1.09 05/09/2017   BUN 7 05/09/2017   NA 142 05/09/2017   K 4.1 05/09/2017   CL 107 05/09/2017   CO2 25 05/09/2017   Lab Results  Component Value Date   ALT 23 05/08/2017   AST 36 05/08/2017   ALKPHOS 49  05/08/2017   BILITOT 0.7 05/08/2017   Lab Results  Component Value Date   CHOL 192 04/21/2017   Lab Results  Component Value Date   HDL 37.10 (L) 04/21/2017   Lab Results  Component Value Date   LDLCALC 150 (H) 12/15/2016   Lab Results  Component Value Date   TRIG 211.0 (H) 04/21/2017   Lab Results  Component Value Date   CHOLHDL 5 04/21/2017   Lab Results  Component Value Date   PSA 1.14 12/15/2016   PSA 1.03 05/05/2009    Lab Results  Component Value Date   HGBA1C 6.5 (H) 05/07/2017   Lab Results  Component Value Date   TSH 0.70 11/11/2016   Lab Results  Component Value Date   CKTOTAL 87 03/14/2013   12 lead EKG today:  Sinus rhythm, rate 73, TWI lead V5 and V6.  No Q waves, no ST segment elevation or depression.  No ectopy.  No hypertrophy.  Normal duration and intervals.   Compared to EKG 05/08/17, the TWI's in V5 and V6 are new, but the TWI's in III and aVF have resolved.  ASSESSMENT/PLAN:  Hospital f/u:  1) Subacute chest pain: EKG with nonspecific T wave changes.   No cardiac enzyme testing was done during recent hospitalization. I recommended he go to ED now to get further evaluation for possible cardiac ischemia.  2) Pneumonia, bilat, no longer requiring oxygen. Likely ok to continue outpt treatment: levaquin.  3) Thoracic aortic aneurism 4.1 cm: needs f/u imaging 1 yr.  4) R lung 12 mm pulm nodule that needs f/u CT after adequate treatment of his pneumonia  (anticipate repeat 1 mo).  5) Nicotine w/drawal.  Advised against any nicotine replacement until cardiac eval is done to r/o ischemia. If this w/u is neg, then he can start nicotine replacement.  6) HTN, poor control: no changes made here today. Suspect we could maximize his amlodipine at 10mg  qd AND increase his clonidine to 0.3 mg tid---but will wait to see what is done in ED/hospital and go from there.  7) Anxiety: this is quite significant: I rx'd alprazolam 0.25mg , 1 tab tid prn, #60, RF x 1. Will discuss getting him on antidepressant in future. Medical decision making of high complexity was utilized today.  An After Visit Summary was printed and given to the patient.  FOLLOW UP:  After ED visit or hospitalization  Signed:  Crissie Sickles, MD           05/10/2017

## 2017-05-10 NOTE — ED Triage Notes (Signed)
Discharged from hospital yesterday for pneumonia.  States he had an emergency at home yesterday (with 61 yr old he has custody of) and requested to leave.  C/o L sided chest pain with sob since yesterday at 5pm.  Seen at PCP office today and sent back to ED.  Pt states some of the pain may be related to anxiety.

## 2017-05-10 NOTE — Telephone Encounter (Signed)
Noted  

## 2017-05-11 ENCOUNTER — Other Ambulatory Visit: Payer: Self-pay

## 2017-05-11 ENCOUNTER — Observation Stay (HOSPITAL_COMMUNITY)
Admission: EM | Admit: 2017-05-11 | Discharge: 2017-05-13 | Disposition: A | Discharge status: Home / Self Care | Payer: 59 | Attending: Family Medicine | Admitting: Family Medicine

## 2017-05-11 ENCOUNTER — Encounter (HOSPITAL_COMMUNITY): Payer: Self-pay | Admitting: Family Medicine

## 2017-05-11 DIAGNOSIS — E119 Type 2 diabetes mellitus without complications: Secondary | ICD-10-CM

## 2017-05-11 DIAGNOSIS — I10 Essential (primary) hypertension: Secondary | ICD-10-CM

## 2017-05-11 DIAGNOSIS — R079 Chest pain, unspecified: Secondary | ICD-10-CM | POA: Diagnosis not present

## 2017-05-11 DIAGNOSIS — J181 Lobar pneumonia, unspecified organism: Secondary | ICD-10-CM

## 2017-05-11 DIAGNOSIS — J189 Pneumonia, unspecified organism: Secondary | ICD-10-CM

## 2017-05-11 DIAGNOSIS — F4323 Adjustment disorder with mixed anxiety and depressed mood: Secondary | ICD-10-CM

## 2017-05-11 DIAGNOSIS — R0602 Shortness of breath: Secondary | ICD-10-CM

## 2017-05-11 DIAGNOSIS — F418 Other specified anxiety disorders: Secondary | ICD-10-CM

## 2017-05-11 DIAGNOSIS — I714 Abdominal aortic aneurysm, without rupture, unspecified: Secondary | ICD-10-CM | POA: Diagnosis present

## 2017-05-11 DIAGNOSIS — I2510 Atherosclerotic heart disease of native coronary artery without angina pectoris: Secondary | ICD-10-CM | POA: Diagnosis present

## 2017-05-11 DIAGNOSIS — R4589 Other symptoms and signs involving emotional state: Secondary | ICD-10-CM | POA: Diagnosis present

## 2017-05-11 LAB — I-STAT TROPONIN, ED: Troponin i, poc: 0 ng/mL (ref 0.00–0.08)

## 2017-05-11 LAB — CBG MONITORING, ED
GLUCOSE-CAPILLARY: 142 mg/dL — AB (ref 65–99)
Glucose-Capillary: 120 mg/dL — ABNORMAL HIGH (ref 65–99)
Glucose-Capillary: 131 mg/dL — ABNORMAL HIGH (ref 65–99)
Glucose-Capillary: 111 mg/dL — ABNORMAL HIGH (ref 65–99)

## 2017-05-11 LAB — TROPONIN I

## 2017-05-11 MED ORDER — ENOXAPARIN SODIUM 40 MG/0.4ML ~~LOC~~ SOLN: 40.0000 mg | SUBCUTANEOUS | Status: DC

## 2017-05-11 MED ORDER — ENOXAPARIN SODIUM 40 MG/0.4ML ~~LOC~~ SOLN
40.0000 mg | SUBCUTANEOUS | Status: DC
  Administered 2017-05-11 – 2017-05-13 (×3): 40 mg via SUBCUTANEOUS
  Filled 2017-05-11 (×4): qty 0.4

## 2017-05-11 MED ORDER — ASPIRIN EC 81 MG PO TBEC
81.0000 mg | DELAYED_RELEASE_TABLET | Freq: Every day | ORAL | Status: DC
  Administered 2017-05-12 – 2017-05-13 (×2): 81 mg via ORAL
  Filled 2017-05-11 (×2): qty 1

## 2017-05-11 MED ORDER — LEVOTHYROXINE SODIUM 25 MCG PO TABS
137.0000 ug | ORAL_TABLET | Freq: Every day | ORAL | Status: DC
  Administered 2017-05-11 – 2017-05-13 (×3): 137 ug via ORAL
  Filled 2017-05-11 (×4): qty 1

## 2017-05-11 MED ORDER — INSULIN ASPART 100 UNIT/ML ~~LOC~~ SOLN: 0.0000 [IU] | Freq: Every day | SUBCUTANEOUS | Status: DC

## 2017-05-11 MED ORDER — ONDANSETRON HCL 4 MG/2ML IJ SOLN: 4.0000 mg | Freq: Four times a day (QID) | INTRAMUSCULAR | Status: DC | PRN

## 2017-05-11 MED ORDER — BENZONATATE 100 MG PO CAPS
100.0000 mg | ORAL_CAPSULE | Freq: Three times a day (TID) | ORAL | Status: DC | PRN
Start: 2017-05-11 — End: 2017-05-13

## 2017-05-11 MED ORDER — AMLODIPINE BESYLATE 5 MG PO TABS
5.0000 mg | ORAL_TABLET | Freq: Two times a day (BID) | ORAL | Status: DC
  Administered 2017-05-11: 5 mg via ORAL
  Filled 2017-05-11: qty 1

## 2017-05-11 MED ORDER — MORPHINE SULFATE (PF) 4 MG/ML IV SOLN
2.0000 mg | INTRAVENOUS | Status: DC | PRN
  Administered 2017-05-11 (×2): 4 mg via INTRAVENOUS
  Administered 2017-05-12: 2 mg via INTRAVENOUS
  Filled 2017-05-11 (×3): qty 1

## 2017-05-11 MED ORDER — NITROGLYCERIN 0.4 MG SL SUBL
0.4000 mg | SUBLINGUAL_TABLET | SUBLINGUAL | Status: DC | PRN
  Administered 2017-05-11: 0.4 mg via SUBLINGUAL
  Filled 2017-05-11: qty 1

## 2017-05-11 MED ORDER — ALPRAZOLAM 0.25 MG PO TABS
0.2500 mg | ORAL_TABLET | Freq: Three times a day (TID) | ORAL | Status: DC | PRN
  Administered 2017-05-11 – 2017-05-12 (×2): 0.25 mg via ORAL
  Filled 2017-05-11 (×3): qty 1

## 2017-05-11 MED ORDER — INSULIN ASPART 100 UNIT/ML ~~LOC~~ SOLN
0.0000 [IU] | Freq: Three times a day (TID) | SUBCUTANEOUS | Status: DC
  Administered 2017-05-11 – 2017-05-12 (×4): 1 [IU] via SUBCUTANEOUS
  Filled 2017-05-11 (×2): qty 1

## 2017-05-11 MED ORDER — LEVOFLOXACIN IN D5W 750 MG/150ML IV SOLN: 750.0000 mg | Freq: Once | INTRAVENOUS | Status: DC

## 2017-05-11 MED ORDER — CLONIDINE HCL 0.2 MG PO TABS
0.3000 mg | ORAL_TABLET | Freq: Two times a day (BID) | ORAL | Status: DC
  Administered 2017-05-11 – 2017-05-13 (×5): 0.3 mg via ORAL
  Filled 2017-05-11 (×4): qty 1

## 2017-05-11 MED ORDER — NITROGLYCERIN 0.4 MG SL SUBL: 0.4000 mg | SUBLINGUAL_TABLET | SUBLINGUAL | Status: DC | PRN

## 2017-05-11 MED ORDER — ASPIRIN 81 MG PO TABS: 81.0000 mg | ORAL_TABLET | Freq: Every day | ORAL | Status: DC

## 2017-05-11 MED ORDER — ACETAMINOPHEN 325 MG PO TABS: 650.0000 mg | ORAL_TABLET | ORAL | Status: DC | PRN

## 2017-05-11 MED ORDER — ZOLPIDEM TARTRATE 5 MG PO TABS: 5.0000 mg | ORAL_TABLET | Freq: Every evening | ORAL | Status: DC | PRN

## 2017-05-11 MED ORDER — RAMIPRIL 2.5 MG PO CAPS
10.0000 mg | ORAL_CAPSULE | Freq: Two times a day (BID) | ORAL | Status: DC
  Administered 2017-05-11 – 2017-05-13 (×5): 10 mg via ORAL
  Filled 2017-05-11 (×3): qty 1
  Filled 2017-05-11 (×3): qty 4
  Filled 2017-05-11: qty 1

## 2017-05-11 MED ORDER — LEVOFLOXACIN IN D5W 750 MG/150ML IV SOLN
750.0000 mg | INTRAVENOUS | Status: DC
  Administered 2017-05-11 – 2017-05-13 (×3): 750 mg via INTRAVENOUS
  Filled 2017-05-11 (×3): qty 150

## 2017-05-11 MED ORDER — EZETIMIBE 10 MG PO TABS
10.0000 mg | ORAL_TABLET | Freq: Every day | ORAL | Status: DC
  Administered 2017-05-11 – 2017-05-13 (×3): 10 mg via ORAL
  Filled 2017-05-11 (×3): qty 1

## 2017-05-11 MED ORDER — VITAMIN D 1000 UNITS PO TABS
500.0000 [IU] | ORAL_TABLET | Freq: Two times a day (BID) | ORAL | Status: DC
  Administered 2017-05-11 – 2017-05-13 (×5): 500 [IU] via ORAL
  Filled 2017-05-11 (×5): qty 1

## 2017-05-11 MED ORDER — SENNOSIDES-DOCUSATE SODIUM 8.6-50 MG PO TABS
1.0000 | ORAL_TABLET | Freq: Two times a day (BID) | ORAL | Status: DC
  Administered 2017-05-11 – 2017-05-13 (×5): 1 via ORAL
  Filled 2017-05-11 (×5): qty 1

## 2017-05-11 MED ORDER — ASPIRIN 81 MG PO CHEW
324.0000 mg | CHEWABLE_TABLET | Freq: Once | ORAL | Status: AC
  Administered 2017-05-11: 324 mg via ORAL
  Filled 2017-05-11: qty 4

## 2017-05-11 MED ORDER — AMLODIPINE BESYLATE 10 MG PO TABS
10.0000 mg | ORAL_TABLET | Freq: Every day | ORAL | Status: DC
  Administered 2017-05-12 – 2017-05-13 (×2): 10 mg via ORAL
  Filled 2017-05-11 (×2): qty 1

## 2017-05-11 NOTE — ED Provider Notes (Signed)
TIME SEEN: 1:39 AM  CHIEF COMPLAINT: Chest pain, shortness of breath  HPI: Patient is a 62 year old male with history of hypertension, hyperlipidemia, diabetes, obesity who presents to the emergency department with left-sided chest pressure and tightness without radiation, shortness of breath.  Recently admitted to the hospital for multifocal pneumonia.  Was discharged on the 26th on Levaquin.  Wife reports that the patient "talked his way out" staying in the hospital any longer as he had to care for a 13-year-old at home.  He now feels like he was discharged to soon.  He feels extremely short of breath especially with exertion.  He is having tightness in his chest.  Was seen by his PCP who recommended he return to the emergency department to "have his heart checked out".  Patient reports he is still having nightly fevers and night sweats.  Took his last dose of oral Levaquin at 10 PM.  ROS: See HPI Constitutional:  fever  Eyes: no drainage  ENT: no runny nose   Cardiovascular:   chest pain  Resp: SOB  GI: no vomiting GU: no dysuria Integumentary: no rash  Allergy: no hives  Musculoskeletal: no leg swelling  Neurological: no slurred speech ROS otherwise negative  PAST MEDICAL HISTORY/PAST SURGICAL HISTORY:  Past Medical History:  Diagnosis Date  . Arthritis   . CAP (community acquired pneumonia) 05/07/2017  . Diabetes mellitus without complication ([place])   . History of thyroidectomy, total 2018   Goiter.  [name] Hx of adenomatous polyp of colon 07/02/2009   As of 09/2016, pt is aware he is overdue for repeat colonoscopy---he knows to call [facility] to set this up.  . Hyperlipidemia    Intol of multiple statins--offered zetia trial 04/2017.  [name] Hypertension   . Lipoma    back  . 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  . Seasonal allergies   . Tachyarrhythmia 1980   age 47 X 2 over 31 month period.  No recurrence since that time.  . Vocal cord paralysis  07/2016   Left vocal cord paralysis noted after thyroid surgery.    MEDICATIONS:  Prior to Admission medications   Medication Sig Start Date End Date Taking? Authorizing Provider  ALPRAZolam (XANAX) 0.25 MG tablet Take 1 tablet (0.25 mg total) by mouth 3 (three) times daily as needed for anxiety. 05/10/17   McGowen, Adrian Blackwater, MD  amLODipine (NORVASC) 5 MG tablet Take 1 tablet (5 mg total) by mouth 2 (two) times daily. 09/15/16   McGowen, Adrian Blackwater, MD  aspirin 81 MG tablet Take 81 mg by mouth daily.    [provider]  benzonatate (TESSALON) 100 MG capsule Take 1 capsule (100 mg total) by mouth every 8 (eight) hours. 05/03/17   Maczis, Barth Kirks, PA-C  cholecalciferol (VITAMIN D) 1000 units tablet Take 500 Units by mouth 2 (two) times daily.     [provider]  cloNIDine (CATAPRES) 0.3 MG tablet Take 1 tablet (0.3 mg total) by mouth 2 (two) times daily. 12/16/16   McGowen, Adrian Blackwater, MD  ezetimibe (ZETIA) 10 MG tablet Take 1 tablet (10 mg total) by mouth daily. 04/25/17   McGowen, Adrian Blackwater, MD  ibuprofen (ADVIL,MOTRIN) 800 MG tablet Take 1 tablet (800 mg total) by mouth every 6 (six) hours as needed for fever. 05/09/17   Geradine Girt, DO  levofloxacin (LEVAQUIN) 750 MG tablet Take 1 tablet (750 mg total) by mouth daily. 05/09/17   Geradine Girt, DO  levothyroxine (  SYNTHROID, LEVOTHROID) 137 MCG tablet TAKE 1 TABLET BY MOUTH  DAILY BEFORE BREAKFAST Patient taking differently: TAKE 1 TABLET (159mcg) BY MOUTH  DAILY BEFORE BREAKFAST 04/12/17   Elayne Snare, MD  metFORMIN (GLUCOPHAGE) 500 MG tablet TAKE 1 TABLET BY MOUTH TWO  TIMES DAILY WITH A MEAL Patient taking differently: TAKE 1 TABLET (500mg )  BY MOUTH TWO  TIMES DAILY WITH A MEAL 04/12/17   McGowen, Adrian Blackwater, MD  ramipril (ALTACE) 10 MG capsule 1 cap po bid Patient taking differently: Take 10 mg by mouth 2 (two) times daily.  09/15/16   McGowen, Adrian Blackwater, MD  senna-docusate (SENOKOT-S) 8.6-50 MG tablet Take 1 tablet by mouth 2 (two)  times daily. 08/05/16   Elgergawy, Silver Huguenin, MD    ALLERGIES:  Allergies  Allergen Reactions  . Tenormin [Atenolol]     Severe "slowing"of functioning  . Tetracycline     Rash Because of a history of documented adverse serious drug reaction;Medi Alert bracelet  is recommended  . Lipitor [Atorvastatin]     D/Ced by him due to Myalgias in Sept 2014  . Codeine Other (See Comments)    Hallucinations. "I see pink elephants"  . Guaifenesin     Other reaction(s): Other (See Comments) unknown  . Mucinex [Guaifenesin Er] Other (See Comments)    Hallucinations "I see pink elephants"    SOCIAL HISTORY:  Social History   Tobacco Use  . Smoking status: Never Smoker  . Smokeless tobacco: Current User    Types: Snuff  Substance Use Topics  . Alcohol use: No    FAMILY HISTORY: Family History  Problem Relation Age of Onset  . Diabetes Mother   . Cancer Mother        lung  . COPD Mother   . Brain cancer Mother   . Arthritis Mother   . Hyperlipidemia Mother   . Heart disease Mother   . Hypertension Mother   . Diabetes Father   . Cancer Father        mesothelioma; skin  . Mental illness Father   . Arthritis Father   . Hyperlipidemia Father   . Heart disease Father   . Hypertension Father   . Heart disease Maternal Grandmother        MI @ 43  . Heart disease Maternal Grandfather        MI @ 6  . Cancer Paternal Grandmother   . Diabetes Paternal Grandmother   . Heart disease Paternal Grandfather        MI @ 35  . Thyroid cancer Maternal Aunt     EXAM: BP (!) 168/99 (BP Location: Right Arm)   Pulse 72   Temp 98.3 F (36.8 C) (Oral)   Resp (!) 22   Ht 5\' 11"  (1.803 m)   Wt 99.3 kg (219 lb)   SpO2 93%   BMI 30.54 kg/m  CONSTITUTIONAL: Alert and oriented and responds appropriately to questions. Well-appearing; well-nourished HEAD: Normocephalic EYES: Conjunctivae clear, pupils appear equal, EOMI ENT: normal nose; moist mucous membranes NECK: Supple, no  meningismus, no nuchal rigidity, no LAD  CARD: RRR; S1 and S2 appreciated; no murmurs, no clicks, no rubs, no gallops RESP: Patient looks very short of breath just talking to me.  He is tachypneic but not hypoxic.  His breath sounds are clear and equal bilaterally; no wheezes, no rhonchi, no rales, no hypoxia or respiratory distress, he is speaking short sentences ABD/GI: Normal bowel sounds; non-distended; soft, non-tender, no rebound, no guarding,  no peritoneal signs, no hepatosplenomegaly BACK:  The back appears normal and is non-tender to palpation, there is no CVA tenderness EXT: Normal ROM in all joints; non-tender to palpation; no edema; normal capillary refill; no cyanosis, no calf tenderness or swelling    SKIN: Normal color for age and race; warm; no rash NEURO: Moves all extremities equally PSYCH: The patient's mood and manner are appropriate. Grooming and personal hygiene are appropriate.  MEDICAL DECISION MAKING: Patient here with complaints of chest pain and shortness of breath.  This all could be from his pneumonia.  It appears his bibasilar opacities are improving but he has a left lower lobe opacity that is persistent.  He did have a CT Angio of his chest on the 24th which showed no pulmonary embolus.  His first troponin here is negative.  His EKG shows no ischemic change.  Will give aspirin, nitroglycerin as he does have multiple risk factors for ACS and obtain second troponin.  He feels he needs to be readmitted to the hospital.  He does look short of breath just talking to me but is not hypoxic at rest on room air.  Will discuss with hospitalist.  ED PROGRESS: Second troponin negative.    1:51 AM Discussed patient's case with hospitalist, Dr. Myna Hidalgo.  I have recommended admission and patient (and family if present) agree with this plan. Admitting physician will place admission orders.  I have recommended admission for further IV antibiotics and chest pain rule out.  I reviewed all  nursing notes, vitals, pertinent previous records, EKGs, lab and urine results, imaging (as available).     EKG Interpretation  Date/Time:  Wednesday May 10 2017 17:40:53 EDT Ventricular Rate:  70 PR Interval:  162 QRS Duration: 94 QT Interval:  400 QTC Calculation: 432 R Axis:   82 Text Interpretation:  Normal sinus rhythm Normal ECG Confirmed by Daune Divirgilio, Cyril Mourning [phone]) on 05/11/2017 1:39:26 AM         Diesha Rostad, Delice Bison, DO 05/11/17 [id number]

## 2017-05-11 NOTE — Progress Notes (Signed)
Timothy Page is a 62 y.o. male with medical history significant for type 2 diabetes mellitus, hypertension, hypothyroidism, and anxiety, now presenting to the emergency department for evaluation of chest pain, shortness of breath, and hypertension.  Patient was just discharged from the hospital on 05/09/2016 after treatment for community-acquired pneumonia.  He was ruled out for PE with CTA chest during that admission.  Since returning home, he has developed pain in the left lower chest that has been constant, moderate in intensity, without alleviating factors identified, and reportedly worse when his blood pressure is elevated.  Patient has been taking his blood pressure frequently since hospital discharge, recording the measurements, and is very anxious about the readings in the 150/90 range, which he describes as "through the roof."  He saw his PCP for evaluation of the chest discomfort and was directed to the ED for further evaluation.  05/11/2017: Patient seen and examined with his wife at bedside.  He reports left-sided chest pain reproducible on palpation.  Troponins negative EKG unremarkable for ST-T elevations.  Patient is anxious about his blood pressure being 150/90 on the monitor in the room.  Please refer to H&P dictated by Dr. Myna Hidalgo on 05/11/2017 for further details on the assessment and plan.

## 2017-05-11 NOTE — ED Notes (Signed)
Admitting Provider at bedside. 

## 2017-05-11 NOTE — ED Notes (Signed)
Pt reports feeling anxious with sitting, allowed pt to ambulated in halls. Pt has steady gait, NAD.

## 2017-05-11 NOTE — H&P (Signed)
History and Physical    BRITAIN SABER ALP:[id number] DOB: 1955/12/26 DOA: 05/11/2017  PCP: Tammi Sou, MD   Patient coming from: Home  Chief Complaint: Chest pain, SOB, hypertension   HPI: Timothy Page is a 62 y.o. male with medical history significant for type 2 diabetes mellitus, hypertension, hypothyroidism, and anxiety, now presenting to the emergency department for evaluation of chest pain, shortness of breath, and hypertension.  Patient was just discharged from the hospital on 05/09/2016 after treatment for community-acquired pneumonia.  He was ruled out for PE with CTA chest during that admission.  Since returning home, he has developed pain in the left lower chest that has been constant, moderate in intensity, without alleviating factors identified, and reportedly worse when his blood pressure is elevated.  Patient has been taking his blood pressure frequently since hospital discharge, recording the measurements, and is very anxious about the readings in the 150/90 range, which he describes as "through the roof."  He saw his PCP for evaluation of the chest discomfort and was directed to the ED for further evaluation.  ED Course: Upon arrival to the ED, patient is found to be afebrile, saturating well on room air, blood pressure 150/90, and vitals otherwise normal.  EKG features a normal sinus rhythm and chest x-ray demonstrates marked improvement in the bilateral lower lobe pneumonia, though with some persistent opacity in the left lower lobe.  Chemistry panel and CBC are unremarkable and initial troponin is within the normal limits.  Patient was treated with 324 mg of aspirin and sublingual nitroglycerin in the ED.  He reports improvement in his symptoms with nitroglycerin.  He will be observed on the telemetry unit for ongoing evaluation and management of chest pain.  Review of Systems:  All other systems reviewed and apart from HPI, are negative.  Past Medical History:    Diagnosis Date  . Arthritis   . CAP (community acquired pneumonia) 05/07/2017  . Diabetes mellitus without complication ([facility])   . History of thyroidectomy, total 2018   Goiter.  [name] Hx of adenomatous polyp of colon 07/02/2009   As of 09/2016, pt is aware he is overdue for repeat colonoscopy---he knows to call [facility] to set this up.  . Hyperlipidemia    Intol of multiple statins--offered zetia trial 04/2017.  [name] Hypertension   . Lipoma    back  . 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  . Seasonal allergies   . Tachyarrhythmia 1980   age 19 X 2 over 14 month period.  No recurrence since that time.  . Vocal cord paralysis 07/2016   Left vocal cord paralysis noted after thyroid surgery.    Past Surgical History:  Procedure Laterality Date  . CARDIAC CATHETERIZATION  1980  . COLONOSCOPY W/ POLYPECTOMY  07/02/2009   tubular adenoma (recall 5 yrs; pt aware he is overdue as of 09/2016 and knows to call  GI to set this up.  . ESOPHAGOGASTRODUODENOSCOPY  07/02/2009   NORMAL  . EYE SURGERY    . finger amputaion     with reattachement  . LASIK    . THYROIDECTOMY N/A 08/03/2016   Procedure: Total THYROIDECTOMY;  Surgeon: [name], MD;  Location: [facility];  Service: ENT;  Laterality: N/A;  Total Thyroidectomy      reports that he has never smoked. His smokeless tobacco use includes snuff. He reports that he does not drink alcohol or use drugs.  Allergies  Allergen Reactions  . Tenormin [  Atenolol]     Severe "slowing"of functioning  . Tetracycline     Rash Because of a history of documented adverse serious drug reaction;Medi Alert bracelet  is recommended  . Lipitor [Atorvastatin]     D/Ced by him due to Myalgias in Sept 2014  . Codeine Other (See Comments)    Hallucinations. "I see pink elephants"  . Guaifenesin     Other reaction(s): Other (See Comments) unknown  . Mucinex [Guaifenesin Er] Other (See Comments)    Hallucinations "I see pink  elephants"    Family History  Problem Relation Age of Onset  . Diabetes Mother   . Cancer Mother        lung  . COPD Mother   . Brain cancer Mother   . Arthritis Mother   . Hyperlipidemia Mother   . Heart disease Mother   . Hypertension Mother   . Diabetes Father   . Cancer Father        mesothelioma; skin  . Mental illness Father   . Arthritis Father   . Hyperlipidemia Father   . Heart disease Father   . Hypertension Father   . Heart disease Maternal Grandmother        MI @ 11  . Heart disease Maternal Grandfather        MI @ 58  . Cancer Paternal Grandmother   . Diabetes Paternal Grandmother   . Heart disease Paternal Grandfather        MI @ 3  . Thyroid cancer Maternal Aunt      Prior to Admission medications   Medication Sig Start Date End Date Taking? Authorizing Provider  ALPRAZolam (XANAX) 0.25 MG tablet Take 1 tablet (0.25 mg total) by mouth 3 (three) times daily as needed for anxiety. 05/10/17  Yes McGowen, Adrian Blackwater, MD  amLODipine (NORVASC) 5 MG tablet Take 1 tablet (5 mg total) by mouth 2 (two) times daily. 09/15/16  Yes McGowen, Adrian Blackwater, MD  aspirin 81 MG tablet Take 81 mg by mouth daily.   Yes [provider]  benzonatate (TESSALON) 100 MG capsule Take 1 capsule (100 mg total) by mouth every 8 (eight) hours. Patient taking differently: Take 100 mg by mouth 3 (three) times daily as needed for cough.  05/03/17  Yes Maczis, Barth Kirks, PA-C  cholecalciferol (VITAMIN D) 1000 units tablet Take 500 Units by mouth 2 (two) times daily.    Yes [provider]  cloNIDine (CATAPRES) 0.3 MG tablet Take 1 tablet (0.3 mg total) by mouth 2 (two) times daily. 12/16/16  Yes McGowen, Adrian Blackwater, MD  ezetimibe (ZETIA) 10 MG tablet Take 1 tablet (10 mg total) by mouth daily. 04/25/17  Yes McGowen, Adrian Blackwater, MD  ibuprofen (ADVIL,MOTRIN) 800 MG tablet Take 1 tablet (800 mg total) by mouth every 6 (six) hours as needed for fever. 05/09/17  Yes Geradine Girt, DO    levofloxacin (LEVAQUIN) 750 MG tablet Take 1 tablet (750 mg total) by mouth daily. 05/09/17  Yes Vann, Jessica U, DO  levothyroxine (SYNTHROID, LEVOTHROID) 137 MCG tablet TAKE 1 TABLET BY MOUTH  DAILY BEFORE BREAKFAST Patient taking differently: TAKE 1 TABLET (138mcg) BY MOUTH  DAILY BEFORE BREAKFAST 04/12/17  Yes Elayne Snare, MD  metFORMIN (GLUCOPHAGE) 500 MG tablet TAKE 1 TABLET BY MOUTH TWO  TIMES DAILY WITH A MEAL Patient taking differently: TAKE 1 TABLET (500mg )  BY MOUTH TWO  TIMES DAILY WITH A MEAL 04/12/17  Yes McGowen, Adrian Blackwater, MD  ramipril ([place]) 10  MG capsule 1 cap po bid Patient taking differently: Take 10 mg by mouth 2 (two) times daily.  09/15/16  Yes McGowen, Adrian Blackwater, MD  senna-docusate (SENOKOT-S) 8.6-50 MG tablet Take 1 tablet by mouth 2 (two) times daily. 08/05/16  Yes Elgergawy, Silver Huguenin, MD    Physical Exam: Vitals:   05/11/17 0200 05/11/17 0230 05/11/17 0300 05/11/17 0330  BP: (!) 160/96 (!) 150/90 (!) 148/86 (!) 146/81  Pulse: 69 68 62 66  Resp: 18 (!) 23 18 20   Temp:      TempSrc:      SpO2: 93% 95% 96% 97%  Weight:      Height:          Constitutional: NAD, anxious Eyes: PERTLA, lids and conjunctivae normal ENMT: Mucous membranes are moist. Posterior pharynx clear of any exudate or lesions.   Neck: normal, supple, no masses, no thyromegaly Respiratory: clear to auscultation bilaterally, no wheezing, no crackles. Normal respiratory effort.    Cardiovascular: S1 & S2 heard, regular rate and rhythm. Trace pedal edema bilaterally. No significant JVD. Abdomen: No distension, no tenderness, soft. Bowel sounds normal.  Musculoskeletal: no clubbing / cyanosis. No joint deformity upper and lower extremities.    Skin: no significant rashes, lesions, ulcers. Warm, dry, well-perfused. Neurologic: CN 2-12 grossly intact. Sensation intact. Strength 5/5 in all 4 limbs.  Psychiatric: Alert and oriented x 3. Pleasant, cooperative, very anxious.     Labs on Admission: I  have personally reviewed following labs and imaging studies  CBC: Recent Labs  Lab 05/07/17 0649 05/07/17 1506 05/08/17 0513 05/09/17 0510 05/10/17 1753  WBC 4.1 2.7* 3.4* 4.2 4.6  HGB 14.2 13.6 12.8* 12.6* 13.6  HCT 41.0 39.3 37.7* 37.5* 40.1  MCV 90.1 89.5 90.4 89.5 89.1  PLT 155 140* 140* 138* 893   Basic Metabolic Panel: Recent Labs  Lab 05/07/17 0649 05/07/17 1506 05/08/17 0513 05/09/17 0510 05/10/17 1753  NA 136  --  139 142 139  K 3.8  --  3.6 4.1 3.6  CL 103  --  108 107 104  CO2 22  --  23 25 24   GLUCOSE 163*  --  132* 112* 138*  BUN 14  --  8 7 6   CREATININE 1.38* 1.18 1.09 1.09 1.05  CALCIUM 8.1*  --  8.2* 8.5* 9.2   GFR: Estimated Creatinine Clearance: 89.8 mL/min (by C-G formula based on SCr of 1.05 mg/dL). Liver Function Tests: Recent Labs  Lab 05/08/17 0513  AST 36  ALT 23  ALKPHOS 49  BILITOT 0.7  PROT 5.8*  ALBUMIN 3.3*   No results for input(s): LIPASE, AMYLASE in the last 168 hours. No results for input(s): AMMONIA in the last 168 hours. Coagulation Profile: No results for input(s): INR, PROTIME in the last 168 hours. Cardiac Enzymes: No results for input(s): CKTOTAL, CKMB, CKMBINDEX, TROPONINI in the last 168 hours. BNP (last 3 results) No results for input(s): PROBNP in the last 8760 hours. HbA1C: No results for input(s): HGBA1C in the last 72 hours. CBG: Recent Labs  Lab 05/08/17 0758 05/08/17 1200 05/08/17 1656 05/08/17 2054 05/09/17 0827  GLUCAP 104* 140* 123* 154* 127*   Lipid Profile: No results for input(s): CHOL, HDL, LDLCALC, TRIG, CHOLHDL, LDLDIRECT in the last 72 hours. Thyroid Function Tests: No results for input(s): TSH, T4TOTAL, FREET4, T3FREE, THYROIDAB in the last 72 hours. Anemia Panel: No results for input(s): VITAMINB12, FOLATE, FERRITIN, TIBC, IRON, RETICCTPCT in the last 72 hours. Urine analysis:    Component  Value Date/Time   COLORURINE STRAW (A) 05/03/2017 2132   APPEARANCEUR CLEAR 05/03/2017 2132     LABSPEC 1.008 05/03/2017 2132   PHURINE 6.0 05/03/2017 2132   GLUCOSEU NEGATIVE 05/03/2017 2132   GLUCOSEU NEGATIVE 09/13/2016 0946   HGBUR SMALL (A) 05/03/2017 2132   HGBUR negative 05/05/2009 1323   BILIRUBINUR NEGATIVE 05/03/2017 2132   KETONESUR NEGATIVE 05/03/2017 2132   PROTEINUR NEGATIVE 05/03/2017 2132   UROBILINOGEN 0.2 09/13/2016 0946   NITRITE NEGATIVE 05/03/2017 2132   LEUKOCYTESUR NEGATIVE 05/03/2017 2132   Sepsis Labs: @LABRCNTIP (procalcitonin:4,lacticidven:4) ) Recent Results (from the past 240 hour(s))  Culture, blood (routine x 2)     Status: None (Preliminary result)   Collection Time: 05/07/17  8:23 AM  Result Value Ref Range Status   Specimen Description BLOOD LEFT ARM  Final   Special Requests   Final    BOTTLES DRAWN AEROBIC AND ANAEROBIC Blood Culture adequate volume   Culture   Final    NO GROWTH 3 DAYS Performed at [facility], [place] [address], [place], [place] [address]    Report Status PENDING  Incomplete  Culture, blood (routine x 2)     Status: None (Preliminary result)   Collection Time: 05/07/17  8:23 AM  Result Value Ref Range Status   Specimen Description BLOOD RIGHT ARM  Final   Special Requests   Final    BOTTLES DRAWN AEROBIC AND ANAEROBIC Blood Culture results may not be optimal due to an excessive volume of blood received in culture bottles   Culture   Final    NO GROWTH 3 DAYS Performed at [facility], [place] [address] [place], [place] [address]    Report Status PENDING  Incomplete  Culture, sputum-assessment     Status: None   Collection Time: 05/08/17 12:26 PM  Result Value Ref Range Status   Specimen Description SPUTUM  Final   Special Requests NONE  Final   Sputum evaluation   Final    THIS SPECIMEN IS ACCEPTABLE FOR SPUTUM CULTURE Performed at [facility], [address], [place], [place] [address]    Report Status 05/08/2017 FINAL  Final  Culture, respiratory (NON-Expectorated)     Status: None    Collection Time: 05/08/17 12:26 PM  Result Value Ref Range Status   Specimen Description SPUTUM  Final   Special Requests NONE Reflexed from [id number]  Final   Gram Stain   Final    FEW WBC PRESENT, PREDOMINANTLY MONONUCLEAR RARE SQUAMOUS EPITHELIAL CELLS PRESENT RARE GRAM POSITIVE COCCI IN CLUSTERS    Culture   Final    Consistent with normal respiratory flora. Performed at [facility], [place] [address] [place], [place] [address]    Report Status 05/10/2017 FINAL  Final     Radiological Exams on Admission: Dg Chest 2 View  Result Date: 05/10/2017 CLINICAL DATA:  Central chest pain with shortness of breath. Recent pneumonia. EXAM: CHEST - 2 VIEW COMPARISON:  Two-view chest x-ray and CT of the chest 05/07/2017. FINDINGS: Heart size is normal. Aeration of the left base is markedly improved. Previously seen right-sided airspace disease is near completely resolved. The left pleural effusion is near completely resolved. Multilevel degenerative changes are again noted in the thoracic spine. IMPRESSION: 1. Marked improvement in bilateral lobar pneumonia. Persistent left lower lobe airspace disease is evident. Electronically Signed   By: San Morelle M.D.   On: 05/10/2017 18:59    EKG: Independently reviewed. Normal sinus rhythm.  Assessment/Plan   1. 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 CAP  - Recently admitted for this, treated with Rocephin and azithromycin, then discharged home with Levaquin - There is no fever, leukocytosis, or hypoxia  - CXR demonstrates marked improvement with persistent opacity in LLL - Continue Levaquin    3. Hypertension  - BP 150/90 in ED  - Beta-blocker preferred in setting of chest pain, but pt reports intolerance - Continue Norvasc, clonidine, and ramipril    4. Type II DM  - A1c was 6.5% this month  - Managed with metformin at home, held on admission  - Check CBG's and use a SSI with Novolog while in hospital   5. Anxiety  - Seems quite anxious on admission and is very  worried about his BP being "through the roof" at 150/90  - Continue prn Xanax     DVT prophylaxis: Lovenox Code Status: Full  Family Communication: Significant other updated at bedside Consults called: None Admission status: Observation    Vianne Bulls, MD [facility] Pager [phone]  If 7PM-7AM, please contact night-coverage [email or web address] Password [facility]  05/11/2017, 3:46 AM

## 2017-05-12 ENCOUNTER — Encounter: Payer: Self-pay | Admitting: Family Medicine

## 2017-05-12 DIAGNOSIS — R079 Chest pain, unspecified: Secondary | ICD-10-CM | POA: Diagnosis not present

## 2017-05-12 DIAGNOSIS — R0602 Shortness of breath: Secondary | ICD-10-CM

## 2017-05-12 DIAGNOSIS — F4323 Adjustment disorder with mixed anxiety and depressed mood: Secondary | ICD-10-CM

## 2017-05-12 DIAGNOSIS — I10 Essential (primary) hypertension: Secondary | ICD-10-CM | POA: Diagnosis not present

## 2017-05-12 DIAGNOSIS — G4700 Insomnia, unspecified: Secondary | ICD-10-CM | POA: Diagnosis not present

## 2017-05-12 DIAGNOSIS — F419 Anxiety disorder, unspecified: Secondary | ICD-10-CM | POA: Diagnosis not present

## 2017-05-12 DIAGNOSIS — R450 Nervousness: Secondary | ICD-10-CM | POA: Diagnosis not present

## 2017-05-12 DIAGNOSIS — I714 Abdominal aortic aneurysm, without rupture: Secondary | ICD-10-CM

## 2017-05-12 DIAGNOSIS — Z818 Family history of other mental and behavioral disorders: Secondary | ICD-10-CM

## 2017-05-12 DIAGNOSIS — E119 Type 2 diabetes mellitus without complications: Secondary | ICD-10-CM | POA: Diagnosis not present

## 2017-05-12 LAB — GLUCOSE, CAPILLARY
GLUCOSE-CAPILLARY: 138 mg/dL — AB (ref 65–99)
Glucose-Capillary: 112 mg/dL — ABNORMAL HIGH (ref 65–99)
Glucose-Capillary: 143 mg/dL — ABNORMAL HIGH (ref 65–99)
Glucose-Capillary: 123 mg/dL — ABNORMAL HIGH (ref 65–99)

## 2017-05-12 LAB — BASIC METABOLIC PANEL
Anion gap: 9 (ref 5–15)
BUN: 10 mg/dL (ref 6–20)
CO2: 25 mmol/L (ref 22–32)
CREATININE: 1.2 mg/dL (ref 0.61–1.24)
Calcium: 9.2 mg/dL (ref 8.9–10.3)
Chloride: 104 mmol/L (ref 101–111)
Glucose, Bld: 136 mg/dL — ABNORMAL HIGH (ref 65–99)
POTASSIUM: 3.8 mmol/L (ref 3.5–5.1)
SODIUM: 138 mmol/L (ref 135–145)

## 2017-05-12 LAB — TSH: TSH: 4.743 u[IU]/mL — AB (ref 0.350–4.500)

## 2017-05-12 LAB — CBC
HCT: 39.5 % (ref 39.0–52.0)
Hemoglobin: 13.4 g/dL (ref 13.0–17.0)
MCH: 30.6 pg (ref 26.0–34.0)
MCHC: 33.9 g/dL (ref 30.0–36.0)
MCV: 90.2 fL (ref 78.0–100.0)
PLATELETS: 243 10*3/uL (ref 150–400)
RBC: 4.38 MIL/uL (ref 4.22–5.81)
RDW: 13.5 % (ref 11.5–15.5)
WBC: 5.3 10*3/uL (ref 4.0–10.5)

## 2017-05-12 LAB — CULTURE, BLOOD (ROUTINE X 2)
Culture: NO GROWTH
Culture: NO GROWTH
Special Requests: ADEQUATE

## 2017-05-12 MED ORDER — HYDROXYZINE HCL 25 MG PO TABS: 25.0000 mg | ORAL_TABLET | Freq: Three times a day (TID) | ORAL | Status: DC | PRN

## 2017-05-12 MED ORDER — MELATONIN 3 MG PO TABS
3.0000 mg | ORAL_TABLET | Freq: Every day | ORAL | Status: DC
  Administered 2017-05-12: 3 mg via ORAL
  Filled 2017-05-12: qty 1

## 2017-05-12 MED ORDER — AMLODIPINE BESYLATE 10 MG PO TABS: 10.0000 mg | ORAL_TABLET | Freq: Every day | ORAL | 0 refills | Status: DC

## 2017-05-12 MED ORDER — FLUOXETINE HCL 10 MG PO CAPS
10.0000 mg | ORAL_CAPSULE | Freq: Every day | ORAL | Status: DC
  Administered 2017-05-12 – 2017-05-13 (×2): 10 mg via ORAL
  Filled 2017-05-12 (×2): qty 1

## 2017-05-12 MED ORDER — HYDROXYZINE HCL 25 MG PO TABS
25.0000 mg | ORAL_TABLET | Freq: Three times a day (TID) | ORAL | Status: DC | PRN
  Administered 2017-05-12: 25 mg via ORAL
  Filled 2017-05-12: qty 1

## 2017-05-12 MED ORDER — ALPRAZOLAM 0.25 MG PO TABS: 0.2500 mg | ORAL_TABLET | Freq: Once | ORAL | Status: DC

## 2017-05-12 MED ORDER — NON FORMULARY: 3.0000 mg | Freq: Every day | Status: DC

## 2017-05-12 MED ORDER — LEVOFLOXACIN 750 MG PO TABS: 750.0000 mg | ORAL_TABLET | Freq: Every day | ORAL | 0 refills | Status: DC

## 2017-05-12 NOTE — Discharge Summary (Addendum)
Discharge Summary  Timothy Page HCW:[id number] DOB: 05-Oct-1955  PCP: Tammi Sou, MD  Admit date: 05/11/2017 Discharge date: 05/13/2017  Time spent: 25 minutes  UPDATE: DISCHARGE DELAYED DUE TO [place] ON 05/12/17.  Recommendations for Outpatient Follow-up:  1. Follow-up with PCP.  Physician 2. Follow-up with psychiatry at [facility] or [facility]. Call to make appointment. 3. Take your medications as prescribed 4. Started Atarax 25 mg TID prn (05/12/17) for anxiety. 5. Avoid taking xanax. 6. Started Prozac 10 mg daily (05/12/17) for anxiety and depression. Increase dose to 20 mg daily after 1 week starting on 05/19/17. 7. Take Prozac and Atarax at least 1 hour apart.  Discharge Diagnoses:  Active Hospital Problems   Diagnosis Date Noted  . Adjustment disorder with mixed anxiety and depressed mood   . Chest pain 05/11/2017  . Anxiety about health 05/11/2017  . Community acquired pneumonia 05/07/2017  . Abdominal aortic aneurysm (AAA) without rupture ([place])   . Controlled type 2 diabetes mellitus without complication, without long-term current use of insulin ([place]) 08/09/2012  . Essential hypertension 08/07/2012  . CAD (coronary artery disease) 04/28/2011    Resolved Hospital Problems  No resolved problems to display.    Discharge Condition: Stable  Diet recommendation: Resume previous diet  Vitals:   05/13/17 0323 05/13/17 0835  BP: 110/67 114/73  Pulse: (!) 54 60  Resp: 16 16  Temp: 98.4 F (36.9 C) 98.7 F (37.1 C)  SpO2: 95% 92%    History of present illness:  Timothy Page a 62 y.o.malewith medical history significant fortype 2 diabetes mellitus, hypertension, hypothyroidism, and anxiety, now presenting to the emergency department for evaluation of chest pain, shortness of breath, and hypertension. Patient was just discharged from the hospital on 05/09/2016 after treatment for community-acquired pneumonia. He was  ruled out for PE with CTA chest during that admission. Since returning home, he has developed pain in the left lower chest that has been constant, moderate in intensity, without alleviating factors identified, and reportedly worse when his blood pressure is elevated. Patient has been taking his blood pressure frequently since hospital discharge, recording the measurements, and is very anxious about the readings in the 150/90 range, which he describes as "through the roof." He saw his PCP for evaluation of the chest discomfort and was directed to the ED for further evaluation.  05/11/2017: Patient seen and examined with his wife at bedside.  He reports left-sided chest pain reproducible on palpation.  Troponins negative EKG unremarkable for ST-T elevations.  Patient is anxious about his blood pressure being 150/90 on the monitor in the room.  05/12/2017: Patient seen and examined with his wife at bedside.  He reports feeling anxious.  Xanax has been given.  Patient advised to follow-up with psychiatry outpatient to address his anxiety and depression.  Patient denies any suicidal ideation or homicidal ideation.  He denies any chest pain dyspnea or palpitations.  He has no other complaints.  On the day of discharge, the patient was hemodynamically stable.  He will need to follow-up with his PCP and psychiatry after hospitalization.   Hospital Course:  Principal Problem:   Adjustment disorder with mixed anxiety and depressed mood Active Problems:   CAD (coronary artery disease)   Essential hypertension   Controlled type 2 diabetes mellitus without complication, without long-term current use of insulin ([facility])   Community acquired pneumonia   Abdominal aortic aneurysm (AAA) without rupture ([facility])   Chest pain   Anxiety  about health  Chest pain, ACS ruled out. Troponin negative EKG no ST T changes Chest pain has resolved No reported changes on telemetry  Recent community-acquired pneumonia,  present on admission No acute sign of infection Afebrile with no leukocytosis Chest x-ray done on 05/10/2017 personally reviewed and compared with previous chest x-rays showed improvement and clearing of previous community-acquired pneumonia. Continue Levaquin 750 mg daily x2 more days to complete previous treatment of community-acquired pneumonia BNP 6.9 Follow-up with PCP  Hypertension Blood pressure is well controlled at this time Increased dose of amlodipine to 10 mg daily Continue clonidine, ramipril, and amlodipine Follow-up with primary care provider post hospitalization  Anxiety/depression Patient denies suicidal ideation or homicidal ideation Patient on Xanax patient recommended to follow-up with a psychiatrist to be on a better regimen for his anxiety Psychiatry consulted Follow-up with psychiatry as outpatient  Chronic constipation Continue Senokot twice daily as needed Increase fiber intake Follow-up with PCP   Procedures:  None  Consultations:  Psychiatry  Discharge Exam: BP 114/73 (BP Location: Left Arm)   Pulse 60   Temp 98.7 F (37.1 C) (Oral)   Resp 16   Ht 5\' 9"  (1.753 m)   Wt 97.1 kg (214 lb 0.6 oz)   SpO2 92%   BMI 31.61 kg/m   General: 62 year old Caucasian male well-developed well-nourished in no acute distress.  Alert and oriented x3. Cardiovascular: Regular rate and rhythm with no rubs or gallops.  No JVD noted. Respiratory: Clear to auscultation with no wheezes or rales.  Discharge Instructions You were cared for by a hospitalist during your hospital stay. If you have any questions about your discharge medications or the care you received while you were in the hospital after you are discharged, you can call the unit and asked to speak with the hospitalist on call if the hospitalist that took care of you is not available. Once you are discharged, your primary care physician will handle any further medical issues. Please note that NO REFILLS  for any discharge medications will be authorized once you are discharged, as it is imperative that you return to your primary care physician (or establish a relationship with a primary care physician if you do not have one) for your aftercare needs so that they can reassess your need for medications and monitor your lab values.   Allergies as of 05/13/2017      Reactions   Tenormin [atenolol]    Severe "slowing"of functioning   Tetracycline    Rash Because of a history of documented adverse serious drug reaction;Medi Alert bracelet  is recommended   Lipitor [atorvastatin]    D/Ced by him due to Myalgias in Sept 2014   Codeine    Hallucinations. "I see pink elephants"   Guaifenesin    Other reaction(s): Other (See Comments) unknown   Mucinex [guaifenesin Er] Other (See Comments)   Hallucinations "I see pink elephants"      Medication List    STOP taking these medications   ALPRAZolam 0.25 MG tablet Commonly known as:  XANAX   ibuprofen 800 MG tablet Commonly known as:  ADVIL,MOTRIN     TAKE these medications   amLODipine 10 MG tablet Commonly known as:  NORVASC Take 1 tablet (10 mg total) by mouth daily. What changed:    medication strength  how much to take  when to take this   aspirin 81 MG tablet Take 81 mg by mouth daily.   benzonatate 100 MG capsule Commonly known as:  TESSALON Take 1 capsule (100 mg total) by mouth every 8 (eight) hours. What changed:    when to take this  reasons to take this   cholecalciferol 1000 units tablet Commonly known as:  VITAMIN D Take 500 Units by mouth 2 (two) times daily.   cloNIDine 0.3 MG tablet Commonly known as:  CATAPRES Take 1 tablet (0.3 mg total) by mouth 2 (two) times daily.   ezetimibe 10 MG tablet Commonly known as:  ZETIA Take 1 tablet (10 mg total) by mouth daily.   FLUoxetine 10 MG capsule Commonly known as:  PROZAC Take 1 capsule (10 mg total) by mouth daily. After 1 week may start taking 20 mg by  mouth daily   hydrOXYzine 25 MG tablet Commonly known as:  ATARAX/VISTARIL Take 1 tablet (25 mg total) by mouth 3 (three) times daily as needed for anxiety.   levofloxacin 750 MG tablet Commonly known as:  LEVAQUIN Take 1 tablet (750 mg total) by mouth daily.   levothyroxine 137 MCG tablet Commonly known as:  SYNTHROID, LEVOTHROID TAKE 1 TABLET BY MOUTH  DAILY BEFORE BREAKFAST What changed:  See the new instructions.   metFORMIN 500 MG tablet Commonly known as:  GLUCOPHAGE TAKE 1 TABLET BY MOUTH TWO  TIMES DAILY WITH A MEAL What changed:  See the new instructions.   ramipril 10 MG capsule Commonly known as:  ALTACE 1 cap po bid What changed:    how much to take  how to take this  when to take this  additional instructions   senna-docusate 8.6-50 MG tablet Commonly known as:  Senokot-S Take 1 tablet by mouth 2 (two) times daily.      Allergies  Allergen Reactions  . Tenormin [Atenolol]     Severe "slowing"of functioning  . Tetracycline     Rash Because of a history of documented adverse serious drug reaction;Medi Alert bracelet  is recommended  . Lipitor [Atorvastatin]     D/Ced by him due to Myalgias in Sept 2014  . Codeine     Hallucinations. "I see pink elephants"  . Guaifenesin     Other reaction(s): Other (See Comments) unknown  . Mucinex [Guaifenesin Er] Other (See Comments)    Hallucinations "I see pink elephants"   Follow-up Information    McGowen, Adrian Blackwater, MD Follow up in 3 day(s).   Specialty:  Family Medicine Contact information: [id number] [place] [address] [phone]        [facility] Follow up in 3 day(s).   Specialty:  [facility] information: [place] [id number] [place] Follow up in 3 day(s).   Specialty:  Catering manager information: [facility] [place]  [id number] [phone]            The results of significant diagnostics from this hospitalization (including imaging, microbiology, ancillary and laboratory) are listed below for reference.    Significant Diagnostic Studies: Dg Chest 2 View  Result Date: 05/10/2017 CLINICAL DATA:  Central chest pain with shortness of breath. Recent pneumonia. EXAM: CHEST - 2 VIEW COMPARISON:  Two-view chest x-ray and CT of the chest 05/07/2017. FINDINGS: Heart size is normal. Aeration of the left base is markedly improved. Previously seen right-sided airspace disease is near completely resolved. The left pleural effusion is near completely resolved. Multilevel degenerative changes are again noted in the thoracic  spine. IMPRESSION: 1. Marked improvement in bilateral lobar pneumonia. Persistent left lower lobe airspace disease is evident. Electronically Signed   By: San Morelle M.D.   On: 05/10/2017 18:59   Dg Chest 2 View  Result Date: 05/07/2017 CLINICAL DATA:  Shortness of breast since February when he was diagnosed with the flu, breathing difficulty especially with exertion or lying down EXAM: CHEST - 2 VIEW COMPARISON:  05/03/2017 FINDINGS: Enlargement of cardiac silhouette. Mediastinal contours and pulmonary vascularity normal. Patchy infiltrates lower LEFT lung increased. Question minimal infiltrate in RIGHT mid lung. No pleural effusion or pneumothorax. Multilevel endplate spur formation thoracic spine. Prominent RIGHT first costochondral junction stable. IMPRESSION: Increased LEFT basilar infiltrate question pneumonia. Question minimal RIGHT mid lung infiltrate. Electronically Signed   By: Lavonia Dana M.D.   On: 05/07/2017 07:30   Dg Chest 2 View  Result Date: 05/03/2017 CLINICAL DATA:  Pt reports 2-3 weeks ago, he was dx with bronchitis and started on antibiotics and prednisone. Pt reports fever that started yesterday and got worse, throbbing headache. EXAM: CHEST - 2 VIEW COMPARISON:  08/05/2016  FINDINGS: The heart size and mediastinal contours are within normal limits. Both lungs are clear. The visualized skeletal structures are unremarkable. IMPRESSION: No active cardiopulmonary disease. Electronically Signed   By: Kathreen Devoid   On: 05/03/2017 20:47   Ct Angio Chest Pe W/cm &/or Wo Cm  Result Date: 05/07/2017 CLINICAL DATA:  Shortness of breath especially with exertion and lying down, recent flu, history diabetes mellitus, hypertension EXAM: CT ANGIOGRAPHY CHEST WITH CONTRAST TECHNIQUE: Multidetector CT imaging of the chest was performed using the standard protocol during bolus administration of intravenous contrast. Multiplanar CT image reconstructions and MIPs were obtained to evaluate the vascular anatomy. CONTRAST:  34mL ISOVUE-370 IOPAMIDOL (ISOVUE-370) INJECTION 76% IV COMPARISON:  None FINDINGS: Cardiovascular: Aneurysmal dilatation of the ascending thoracic aorta 4.1 cm diameter image 37. No aortic dissection. Pulmonary arteries adequately opacified and patent. No evidence of pulmonary embolism. No pericardial effusion. Mediastinum/Nodes: Esophagus unremarkable. Base of cervical region normal appearance. No thoracic adenopathy. Lungs/Pleura: Airspace infiltrates throughout LEFT lower lobe with less in LEFT upper lobe/lingula and RIGHT upper lobe consistent with pneumonia. Some of the observed infiltrates are somewhat nodular in appearance especially in RIGHT lung where a focal 12 mm opacity image 39 and 19 mm opacity image 43 in the mid lung are identified. No pleural effusion or pneumothorax. Upper Abdomen: Visualized upper abdomen unremarkable Musculoskeletal: No acute osseous findings. Review of the MIP images confirms the above findings. IMPRESSION: No evidence of pulmonary embolism. Extensive patchy BILATERAL pulmonary infiltrates greater on LEFT consistent with multifocal lung pneumonia. Since some of these areas of consolidation are nodular in configuration, recommend follow-up  noncontrast CT imaging in 3 months to ensure resolution and exclude underlying pulmonary mass/nodule. 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 Recommend annual imaging followup by CTA or MRA. This recommendation follows 2010 ACCF/AHA/AATS/ACR/ASA/SCA/SCAI/SIR/STS/SVM Guidelines for the Diagnosis and Management of Patients with Thoracic Aortic Disease. Circulation. 2010; 121: D220-U542 Aortic aneurysm NOS (ICD10-I71.9). Electronically Signed   By: Lavonia Dana M.D.   On: 05/07/2017 10:49   Ct Maxillofacial Wo Contrast  Result Date: 05/03/2017 CLINICAL DATA:  Headache query dental, sinus or mastoid etiology. EXAM: CT MAXILLOFACIAL WITHOUT CONTRAST TECHNIQUE: Multidetector CT imaging of the maxillofacial structures was performed. Multiplanar CT image reconstructions were also generated. COMPARISON:  07/31/2016 neck CT FINDINGS: Osseous: Small mucous retention cyst or polypoid mucosal thickening off the medial wall of the inferior  right mastoid sinus. Minimal mucosal thickening is otherwise noted along the maxillary antrum and lateral wall. No air-fluid levels are visualized of the paranasal sinuses. Minimal mucosal thickening is seen of the posterior left ethmoid sinus. Frontal sinus is clear. The sphenoid sinus is unremarkable. Mastoids are clear. Dentition appears intact without evidence of significant dental carie nor periodontal disease. Temporomandibular joints are maintained bilaterally without significant joint space narrowing osteoarthritis. Orbits: Symmetric appearing orbits and globes. Extraocular muscles are symmetric in appearance without enlargement. Optic nerves are normal in caliber and symmetric. Sinuses: As above Soft tissues: Salivary glands are unremarkable without acute inflammatory process or sialolithiasis. Limited intracranial: Negative IMPRESSION: 1. Small mucous retention cyst or polypoid mucosal thickening of the right maxillary sinus. 2. No  acute sinusitis, mastoiditis or significant dental/periosteal abnormality. Electronically Signed   By: Ashley Royalty M.D.   On: 05/03/2017 20:23    Microbiology: Recent Results (from the past 240 hour(s))  Culture, blood (routine x 2)     Status: None   Collection Time: 05/07/17  8:23 AM  Result Value Ref Range Status   Specimen Description BLOOD LEFT ARM  Final   Special Requests   Final    BOTTLES DRAWN AEROBIC AND ANAEROBIC Blood Culture adequate volume   Culture   Final    NO GROWTH 5 DAYS Performed at [facility], [address] [place], [place] [address]    Report Status 05/12/2017 FINAL  Final  Culture, blood (routine x 2)     Status: None   Collection Time: 05/07/17  8:23 AM  Result Value Ref Range Status   Specimen Description BLOOD RIGHT ARM  Final   Special Requests   Final    BOTTLES DRAWN AEROBIC AND ANAEROBIC Blood Culture results may not be optimal due to an excessive volume of blood received in culture bottles   Culture   Final    NO GROWTH 5 DAYS Performed at [facility], [place] [address] [place], [place] [address]    Report Status 05/12/2017 FINAL  Final  Culture, sputum-assessment     Status: None   Collection Time: 05/08/17 12:26 PM  Result Value Ref Range Status   Specimen Description SPUTUM  Final   Special Requests NONE  Final   Sputum evaluation   Final    THIS SPECIMEN IS ACCEPTABLE FOR SPUTUM CULTURE Performed at [facility], [address], [place], [place] [address]    Report Status 05/08/2017 FINAL  Final  Culture, respiratory (NON-Expectorated)     Status: None   Collection Time: 05/08/17 12:26 PM  Result Value Ref Range Status   Specimen Description SPUTUM  Final   Special Requests NONE Reflexed from [id number]  Final   Gram Stain   Final    FEW WBC PRESENT, PREDOMINANTLY MONONUCLEAR RARE SQUAMOUS EPITHELIAL CELLS PRESENT RARE GRAM POSITIVE COCCI IN CLUSTERS    Culture   Final    Consistent with normal respiratory  flora. Performed at [facility], [place] [address] [place], [place] [address]    Report Status 05/10/2017 FINAL  Final     Labs: Basic Metabolic Panel: Recent Labs  Lab 05/08/17 0513 05/09/17 0510 05/10/17 1753 05/12/17 0816 05/13/17 0413  NA 139 142 139 138 139  K 3.6 4.1 3.6 3.8 4.0  CL 108 107 104 104 105  CO2 23 25 24 25 24   GLUCOSE 132* 112* 138* 136* 123*  BUN 8 7 6 10 12   CREATININE 1.09 1.09  1.05 1.20 1.33*  CALCIUM 8.2* 8.5* 9.2 9.2 9.1   Liver Function Tests: Recent Labs  Lab 05/08/17 0513  AST 36  ALT 23  ALKPHOS 49  BILITOT 0.7  PROT 5.8*  ALBUMIN 3.3*   No results for input(s): LIPASE, AMYLASE in the last 168 hours. No results for input(s): AMMONIA in the last 168 hours. CBC: Recent Labs  Lab 05/07/17 1506 05/08/17 0513 05/09/17 0510 05/10/17 1753 05/12/17 0816  WBC 2.7* 3.4* 4.2 4.6 5.3  HGB 13.6 12.8* 12.6* 13.6 13.4  HCT 39.3 37.7* 37.5* 40.1 39.5  MCV 89.5 90.4 89.5 89.1 90.2  PLT 140* 140* 138* 205 243   Cardiac Enzymes: Recent Labs  Lab 05/11/17 0356 05/11/17 0946 05/11/17 1546  TROPONINI <0.03 <0.03 <0.03   BNP: BNP (last 3 results) Recent Labs    05/07/17 0808  BNP 6.9    ProBNP (last 3 results) No results for input(s): PROBNP in the last 8760 hours.  CBG: Recent Labs  Lab 05/12/17 0816 05/12/17 1223 05/12/17 1652 05/12/17 2128 05/13/17 0833  GLUCAP 138* 112* 143* 123* 115*       Signed:  Velvet Bathe, MD [facility] 05/13/2017, 9:14 AM   Above note reviewed. Patient stable from my standpoint.  Psychiatry signed off. Unsure why discharge was delayed yesterday. VSS  I Have spent more than 15 minutes adjusting medication regimen. Reviewing notes and ensuring plan is up to par with recommendations laid out by psychiatrist.   Velvet Bathe, MD

## 2017-05-12 NOTE — Progress Notes (Signed)
Received order for PT evaluation imminent discharge. Spoke with pt and wife. Had already seen pt ambulating around unit earlier this morning. Both pt and wife report he is completely independent at home and remains very active. No recent falls. No equipment needs. No questions or concerns. Pt does not require acute PT or follow-up PT services at this time. Thank you for this referral.  Mabeline Caras, PT, DPT Acute Rehab Services  Pager: [phone]

## 2017-05-12 NOTE — Discharge Instructions (Signed)

## 2017-05-12 NOTE — Consult Note (Signed)
[facility] Face-to-Face Psychiatry Consult   Reason for Consult:  Depression and anxiety. Referring Physician:  Dr. Nevada Crane Patient Identification: Timothy Page MRN:  [id number] Principal Diagnosis: Adjustment disorder with mixed anxiety and depressed mood Diagnosis:   Patient Active Problem List   Diagnosis Date Noted  . Chest pain [R07.9] 05/11/2017  . Anxiety about health [F41.8] 05/11/2017  . Community acquired pneumonia [J18.9] 05/07/2017  . Lung nodule [R91.1]   . Abdominal aortic aneurysm (AAA) without rupture ([place]) [I71.4]   . Hyperlipidemia [E78.5]   . 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 Vocal cord paralysis [J38.00] 08/12/2016  . Goiter [E04.9] 07/31/2016  . Cough [R05] 03/06/2015  . Controlled type 2 diabetes mellitus without complication, without long-term current use of insulin ([place]) [E11.9] 08/09/2012  . Essential hypertension [I10] 08/07/2012  . Mixed hyperlipidemia [E78.2] 08/07/2012  . CAD (coronary artery disease) [I25.10] 04/28/2011  . Unspecified adverse effect of unspecified drug, medicinal and biological substance [T88.7XXA] 04/28/2011  . Lipoma [D17.9] 03/01/2011  . Tremor [R25.1] 03/01/2011  . Hx of adenomatous polyp of colon [Z86.010] 07/02/2009  . UNSPECIFIED PROSTATITIS [N41.9] 05/05/2009    Total Time spent with patient: 1 hour  Subjective:   Timothy Page is a 62 y.o. male patient admitted with chest pain.  HPI:  Per chart review, patient was admitted with chest pain. His workup has been unremarkable. He was recently discharged from the hospital with CAP so his chest pain could possibly be pleuritic in nature. He seems anxious on interview and has been worried about his blood pressure readings. He is prescribed Xanax 0.25 mg TID PRN in the hospital (x 2 doses over the past 24 hours). PMP indicates one script filled for Xanax in June for 10 tablets.   On interview, [name] denies a prior history of depression or anxiety.  He  reports worsening anxiety and mood the past couple of weeks due to worsening medical condition.  He feels restless like he wants to "jump out of his skin." He usually feels this way when his blood pressure is elevated or after he eats.  He feels clammy like he could "scream and run."  He reports fluctuations in his temperature in the morning from feeling hot to cold.  He is scared about his health and is afraid of having a MI.  He has never been sick like this before.  He reports that the Xanax given in the hospital has helped to some degree but the effect wanes after a short time. He reports that walking does help his anxiety.  He reports poor sleep, poor appetite, weight loss of 7 pounds over the past 1.5 weeks and low energy.  He is usually active with his 78 y/o grandson.  He denies SI, HI or AVH.  He denies a history of manic symptoms (decreased need for sleep, increased energy, pressured speech or impulsivity).   Past Psychiatric History: Denies   Risk to Self: Is patient at risk for suicide?: No Risk to Others: None.  Denies HI.  Prior Inpatient Therapy:  Denies Prior Outpatient Therapy:  Denies   Past Medical History:  Past Medical History:  Diagnosis Date  . Arthritis   . CAP (community acquired pneumonia) 05/07/2017  . Diabetes mellitus without complication ([place])   . History of thyroidectomy, total 2018   Goiter.  [name] Hx of adenomatous polyp of colon 07/02/2009   As of 09/2016, pt is aware he is overdue for repeat colonoscopy---he knows to call [facility] to set this up.  . Hyperlipidemia    Intol of multiple statins--offered zetia trial 04/2017.  [name] Hypertension   . Lipoma    back  . 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  . Seasonal allergies   . Tachyarrhythmia 1980   age 62 X 2 over 38 month period.  No recurrence since that time.  . Vocal cord paralysis 07/2016   Left vocal cord paralysis noted after thyroid surgery.    Past Surgical History:   Procedure Laterality Date  . CARDIAC CATHETERIZATION  1980  . COLONOSCOPY W/ POLYPECTOMY  07/02/2009   tubular adenoma (recall 5 yrs; pt aware he is overdue as of 09/2016 and knows to call [facility] to set this up.  . ESOPHAGOGASTRODUODENOSCOPY  07/02/2009   NORMAL  . EYE SURGERY    . finger amputaion     with reattachement  . LASIK    . THYROIDECTOMY N/A 08/03/2016   Procedure: Total THYROIDECTOMY;  Surgeon: [name], MD;  Location: [facility] OR;  Service: ENT;  Laterality: N/A;  Total Thyroidectomy    Family History:  Family History  Problem Relation Age of Onset  . Diabetes Mother   . Cancer Mother        lung  . COPD Mother   . Brain cancer Mother   . Arthritis Mother   . Hyperlipidemia Mother   . Heart disease Mother   . Hypertension Mother   . Diabetes Father   . Cancer Father        mesothelioma; skin  . Mental illness Father   . Arthritis Father   . Hyperlipidemia Father   . Heart disease Father   . Hypertension Father   . Heart disease Maternal Grandmother        MI @ 45  . Heart disease Maternal Grandfather        MI @ 5  . Cancer Paternal Grandmother   . Diabetes Paternal Grandmother   . Heart disease Paternal Grandfather        MI @ 93  . Thyroid cancer Maternal Aunt    Family Psychiatric  History: Denies Social History:  Social History   Substance and Sexual Activity  Alcohol Use No     Social History   Substance and Sexual Activity  Drug Use No    Social History   Socioeconomic History  . Marital status: Married    Spouse name: Not on file  . Number of children: Not on file  . Years of education: Not on file  . Highest education level: Not on file  Occupational History  . Not on file  Social Needs  . Financial resource strain: Not on file  . Food insecurity:    Worry: Not on file    Inability: Not on file  . Transportation needs:    Medical: Not on file    Non-medical: Not on file  Tobacco Use  . Smoking status: Never Smoker  .  Smokeless tobacco: Current User    Types: Snuff  Substance and Sexual Activity  . Alcohol use: No  . Drug use: No  . Sexual activity: Not on file  Lifestyle  . Physical activity:    Days per week: Not on file    Minutes per session: Not on file  . Stress: Not on file  Relationships  . Social connections:    Talks on phone: Not on file    Gets together: Not on file  Attends religious service: Not on file    Active member of club or organization: Not on file    Attends meetings of clubs or organizations: Not on file    Relationship status: Not on file  Other Topics Concern  . Not on file  Social History Narrative   Married, 2 daughters, 1 grandson that lives with him.   Educ: some college   Occup: retired from the [employer].   No T/A/Ds.   Additional Social History: Patient lives with his wife of 55 years.  He has 2 children and 2 grandchildren.  He has legal custody of his 30 y/o grandson. He is unemployed.  He denies alcohol or illicit substance use.  He previously used smokeless tobacco.    Allergies:   Allergies  Allergen Reactions  . Tenormin [Atenolol]     Severe "slowing"of functioning  . Tetracycline     Rash Because of a history of documented adverse serious drug reaction;Medi Alert bracelet  is recommended  . Lipitor [Atorvastatin]     D/Ced by him due to Myalgias in Sept 2014  . Codeine     Hallucinations. "I see pink elephants"  . Guaifenesin     Other reaction(s): Other (See Comments) unknown  . Mucinex [Guaifenesin Er] Other (See Comments)    Hallucinations "I see pink elephants"    Labs:  Results for orders placed or performed during the hospital encounter of 05/11/17 (from the past 48 hour(s))  Basic metabolic panel     Status: Abnormal   Collection Time: 05/10/17  5:53 PM  Result Value Ref Range   Sodium 139 135 - 145 mmol/L   Potassium 3.6 3.5 - 5.1 mmol/L   Chloride 104 101 - 111 mmol/L   CO2 24 22 - 32 mmol/L   Glucose, Bld 138 (H) 65  - 99 mg/dL   BUN 6 6 - 20 mg/dL   Creatinine, Ser 1.05 0.61 - 1.24 mg/dL   Calcium 9.2 8.9 - 10.3 mg/dL   GFR calc non Af Amer >60 >60 mL/min   GFR calc Af Amer >60 >60 mL/min    Comment: (NOTE) The eGFR has been calculated using the CKD EPI equation. This calculation has not been validated in all clinical situations. eGFR's persistently <60 mL/min signify possible Chronic Kidney Disease.    Anion gap 11 5 - 15    Comment: Performed at [facility] [address], [place] [address]  CBC     Status: None   Collection Time: 05/10/17  5:53 PM  Result Value Ref Range   WBC 4.6 4.0 - 10.5 K/uL   RBC 4.50 4.22 - 5.81 MIL/uL   Hemoglobin 13.6 13.0 - 17.0 g/dL   HCT 40.1 39.0 - 52.0 %   MCV 89.1 78.0 - 100.0 fL   MCH 30.2 26.0 - 34.0 pg   MCHC 33.9 30.0 - 36.0 g/dL   RDW 13.3 11.5 - 15.5 %   Platelets 205 150 - 400 K/uL    Comment: Performed at [facility] [address] [place], [place] [address]  I-stat troponin, ED     Status: None   Collection Time: 05/10/17  5:58 PM  Result Value Ref Range   Troponin i, poc 0.01 0.00 - 0.08 ng/mL   Comment 3            Comment: Due to the release kinetics of cTnI, a negative result within the first hours of the onset of symptoms does  not rule out myocardial infarction with certainty. If myocardial infarction is still suspected, repeat the test at appropriate intervals.   I-stat troponin, ED     Status: None   Collection Time: 05/11/17  2:18 AM  Result Value Ref Range   Troponin i, poc 0.00 0.00 - 0.08 ng/mL   Comment 3            Comment: Due to the release kinetics of cTnI, a negative result within the first hours of the onset of symptoms does not rule out myocardial infarction with certainty. If myocardial infarction is still suspected, repeat the test at appropriate intervals.   Troponin I     Status: None   Collection Time: 05/11/17  3:56 AM  Result Value Ref Range   Troponin I <0.03 <0.03 ng/mL     Comment: Performed at [facility] [address], [place], [place] [address]  CBG monitoring, ED     Status: Abnormal   Collection Time: 05/11/17  7:41 AM  Result Value Ref Range   Glucose-Capillary 142 (H) 65 - 99 mg/dL   Comment 1 Notify RN    Comment 2 Document in Chart   Troponin I     Status: None   Collection Time: 05/11/17  9:46 AM  Result Value Ref Range   Troponin I <0.03 <0.03 ng/mL    Comment: Performed at [facility], [place] [address] [place], [place] [address]  CBG monitoring, ED     Status: Abnormal   Collection Time: 05/11/17 12:03 PM  Result Value Ref Range   Glucose-Capillary 120 (H) 65 - 99 mg/dL  Troponin I     Status: None   Collection Time: 05/11/17  3:46 PM  Result Value Ref Range   Troponin I <0.03 <0.03 ng/mL    Comment: Performed at [facility] [address] [place], [place] [address]  CBG monitoring, ED     Status: Abnormal   Collection Time: 05/11/17  6:35 PM  Result Value Ref Range   Glucose-Capillary 131 (H) 65 - 99 mg/dL  CBG monitoring, ED     Status: Abnormal   Collection Time: 05/11/17  9:28 PM  Result Value Ref Range   Glucose-Capillary 111 (H) 65 - 99 mg/dL   Comment 1 Document in Chart   CBC     Status: None   Collection Time: 05/12/17  8:16 AM  Result Value Ref Range   WBC 5.3 4.0 - 10.5 K/uL   RBC 4.38 4.22 - 5.81 MIL/uL   Hemoglobin 13.4 13.0 - 17.0 g/dL   HCT 39.5 39.0 - 52.0 %   MCV 90.2 78.0 - 100.0 fL   MCH 30.6 26.0 - 34.0 pg   MCHC 33.9 30.0 - 36.0 g/dL   RDW 13.5 11.5 - 15.5 %   Platelets 243 150 - 400 K/uL    Comment: Performed at [facility], [place]. [address], [place], [place] [address]  Basic metabolic panel     Status: Abnormal   Collection Time: 05/12/17  8:16 AM  Result Value Ref Range   Sodium 138 135 - 145 mmol/L   Potassium 3.8 3.5 - 5.1 mmol/L   Chloride 104 101 - 111 mmol/L   CO2 25 22 - 32 mmol/L   Glucose, Bld 136 (H) 65 - 99 mg/dL   BUN 10 6 - 20 mg/dL   Creatinine, Ser 1.20  0.61 - 1.24 mg/dL   Calcium 9.2 8.9 - 10.3 mg/dL  GFR calc non Af Amer >60 >60 mL/min   GFR calc Af Amer >60 >60 mL/min    Comment: (NOTE) The eGFR has been calculated using the CKD EPI equation. This calculation has not been validated in all clinical situations. eGFR's persistently <60 mL/min signify possible Chronic Kidney Disease.    Anion gap 9 5 - 15    Comment: Performed at [facility] [address], [place], [place] [address]  TSH     Status: Abnormal   Collection Time: 05/12/17  8:16 AM  Result Value Ref Range   TSH 4.743 (H) 0.350 - 4.500 uIU/mL    Comment: Performed by a 3rd Generation assay with a functional sensitivity of <=0.01 uIU/mL. Performed at [facility], [place] [address], [place], [place] [address]   Glucose, capillary     Status: Abnormal   Collection Time: 05/12/17  8:16 AM  Result Value Ref Range   Glucose-Capillary 138 (H) 65 - 99 mg/dL    Current Facility-Administered Medications  Medication Dose Route Frequency Provider Last Rate Last Dose  . acetaminophen (TYLENOL) tablet 650 mg  650 mg Oral Q4H PRN Opyd, Ilene Qua, MD      . ALPRAZolam Duanne Moron) tablet 0.25 mg  0.25 mg Oral TID PRN Vianne Bulls, MD   0.25 mg at 05/12/17 0709  . amLODipine (NORVASC) tablet 10 mg  10 mg Oral Daily Irene Pap N, DO   10 mg at 05/12/17 1014  . aspirin EC tablet 81 mg  81 mg Oral Daily Opyd, Ilene Qua, MD   81 mg at 05/12/17 1015  . benzonatate (TESSALON) capsule 100 mg  100 mg Oral TID PRN Opyd, Ilene Qua, MD      . cholecalciferol (VITAMIN D) tablet 500 Units  500 Units Oral BID Vianne Bulls, MD   500 Units at 05/12/17 1015  . cloNIDine (CATAPRES) tablet 0.3 mg  0.3 mg Oral BID Opyd, Ilene Qua, MD   0.3 mg at 05/12/17 1014  . enoxaparin (LOVENOX) injection 40 mg  40 mg Subcutaneous Q24H Irene Pap N, DO   40 mg at 05/12/17 2778  . ezetimibe (ZETIA) tablet 10 mg  10 mg Oral Daily Opyd, Ilene Qua, MD   10 mg at 05/12/17 1014  . insulin aspart (novoLOG)  injection 0-5 Units  0-5 Units Subcutaneous QHS Opyd, Timothy S, MD      . insulin aspart (novoLOG) injection 0-9 Units  0-9 Units Subcutaneous TID WC Opyd, Ilene Qua, MD   1 Units at 05/12/17 [phone]  . levofloxacin (LEVAQUIN) IVPB 750 mg  750 mg Intravenous Q24H Opyd, Ilene Qua, MD   Stopped at 05/12/17 0450  . levothyroxine (SYNTHROID, LEVOTHROID) tablet 137 mcg  137 mcg Oral QAC breakfast Opyd, Ilene Qua, MD   137 mcg at 05/12/17 0526  . morphine 4 MG/ML injection 2-4 mg  2-4 mg Intravenous Q4H PRN Opyd, Ilene Qua, MD   4 mg at 05/11/17 2034  . nitroGLYCERIN (NITROSTAT) SL tablet 0.4 mg  0.4 mg Sublingual Q5 Min x 3 PRN Opyd, Ilene Qua, MD      . ondansetron (ZOFRAN) injection 4 mg  4 mg Intravenous Q6H PRN Opyd, Ilene Qua, MD      . ramipril (ALTACE) capsule 10 mg  10 mg Oral BID Opyd, Ilene Qua, MD   10 mg at 05/12/17 1015  . senna-docusate (Senokot-S) tablet 1 tablet  1 tablet Oral BID Opyd, Ilene Qua, MD   1 tablet at 05/12/17  1014  . zolpidem (AMBIEN) tablet 5 mg  5 mg Oral QHS PRN,MR X 1 Opyd, Ilene Qua, MD        Musculoskeletal: Strength & Muscle Tone: within normal limits Gait & Station: UTA due to patient lying in bed. Patient leans: N/A  Psychiatric Specialty Exam: Physical Exam  Nursing note and vitals reviewed. Constitutional: He is oriented to person, place, and time. He appears well-developed and well-nourished.  HENT:  Head: Normocephalic and atraumatic.  Neck: Normal range of motion.  Respiratory: Effort normal.  Musculoskeletal: Normal range of motion.  Neurological: He is alert and oriented to person, place, and time.  Skin: No rash noted.  Psychiatric: His speech is normal and behavior is normal. Judgment and thought content normal. His mood appears anxious. Cognition and memory are normal. He exhibits a depressed mood.    Review of Systems  Constitutional: Positive for chills and fever.  Cardiovascular: Positive for chest pain.  Gastrointestinal: Negative for  abdominal pain, constipation, diarrhea, nausea and vomiting.  Psychiatric/Behavioral: Positive for depression. Negative for hallucinations, substance abuse and suicidal ideas. The patient is nervous/anxious and has insomnia.   All other systems reviewed and are negative.   Blood pressure 124/73, pulse (!) 59, temperature 98 F (36.7 C), temperature source Oral, resp. rate 17, height '5\' 9"'  (1.753 m), weight 97.1 kg (214 lb 0.6 oz), SpO2 93 %.Body mass index is 31.61 kg/m.  General Appearance: Fairly Groomed, middle aged, Caucasian male, wearing casual clothes and lying in bed. NAD.   Eye Contact:  Good  Speech:  Clear and Coherent and Normal Rate  Volume:  Normal  Mood:  Anxious and Depressed  Affect:  Congruent  Thought Process:  Goal Directed, Linear and Descriptions of Associations: Intact  Orientation:  Full (Time, Place, and Person)  Thought Content:  Logical  Suicidal Thoughts:  No  Homicidal Thoughts:  No  Memory:  Immediate;   Good Recent;   Good Remote;   Good  Judgement:  Good  Insight:  Good  Psychomotor Activity:  Normal  Concentration:  Concentration: Good and Attention Span: Good  Recall:  Good  Fund of Knowledge:  Good  Language:  Good  Akathisia:  No  Handed:  Right  AIMS (if indicated):   N/A  Assets:  Communication Skills Desire for Improvement Housing Intimacy Social Support  ADL's:  Intact  Cognition:  WNL  Sleep:   Poor   Assessment:  Timothy Page is a 62 y.o. male who was admitted with chest pain.  Patient reports a decline in his mood and anxiety over the past couple weeks due to his medical condition.  He reports poor sleep, poor appetite, weight loss and low energy.  He also reports physical symptoms of anxiety.  He denies SI, HI or AVH.  He denies any history of manic symptoms.  He may benefit from starting an antidepressant for depression and anxiety and Atarax as needed for anxiety.  Treatment Plan Summary: -Start Prozac 10 mg daily for  depression and anxiety.  Increase Prozac to 20 mg daily after 1 week. -Start Atarax 25 mg TID PRN for anxiety. -Patient plans to follow up with his PCP for further medication management.  -Patient is psychiatrically cleared.  Psychiatry will sign off patient at this time.  Please consult psychiatry again as needed.  Disposition: No evidence of imminent risk to self or others at present.   Patient does not meet criteria for psychiatric inpatient admission.  Faythe Dingwall, DO 05/12/2017  11:20 AM

## 2017-05-13 LAB — BASIC METABOLIC PANEL
ANION GAP: 10 (ref 5–15)
BUN: 12 mg/dL (ref 6–20)
CALCIUM: 9.1 mg/dL (ref 8.9–10.3)
CO2: 24 mmol/L (ref 22–32)
Chloride: 105 mmol/L (ref 101–111)
Creatinine, Ser: 1.33 mg/dL — ABNORMAL HIGH (ref 0.61–1.24)
GFR calc Af Amer: >60 mL/min (ref >60)
GFR calc non Af Amer: 57 mL/min — ABNORMAL LOW (ref >60)
GLUCOSE: 123 mg/dL — AB (ref 65–99)
Potassium: 4 mmol/L (ref 3.5–5.1)
Sodium: 139 mmol/L (ref 135–145)

## 2017-05-13 LAB — GLUCOSE, CAPILLARY: Glucose-Capillary: 115 mg/dL — ABNORMAL HIGH (ref 65–99)

## 2017-05-13 LAB — VITAMIN D 25 HYDROXY (VIT D DEFICIENCY, FRACTURES): Vit D, 25-Hydroxy: 29.9 ng/mL — ABNORMAL LOW (ref 30.0–100.0)

## 2017-05-13 MED ORDER — HYDROXYZINE HCL 25 MG PO TABS: 25.0000 mg | ORAL_TABLET | Freq: Three times a day (TID) | ORAL | 0 refills | Status: DC | PRN

## 2017-05-13 MED ORDER — FLUOXETINE HCL 10 MG PO CAPS: 10.0000 mg | ORAL_CAPSULE | Freq: Every day | ORAL | 0 refills | Status: DC

## 2017-05-13 NOTE — Progress Notes (Addendum)
2345 Bedside shift report. Pt sleeping, easy to arouse, NAD, fall precautions in place, spouse at bedside. WCTM.   0230 Central tele called RN about pt's heart rate in low 40s. RN to bedside, pt sleeping, easy to arouse, HR up to 50s. WCTM.   0515 Pt medicated per MAR. No complaints, denies pain, SOB. Fall precautions in place, [facility].   0645 Pt resting comfortably. NAD, fall precautions in place, awaiting day shift RN for report.

## 2017-05-15 ENCOUNTER — Telehealth: Payer: Self-pay

## 2017-05-15 ENCOUNTER — Other Ambulatory Visit: Payer: 59

## 2017-05-15 NOTE — Telephone Encounter (Signed)
Transition Care Management Follow-up Telephone Call  Admit date: 05/11/2017 Discharge date: 05/13/2017    Adjustment disorder with mixed anxiety and depressed mood    How have you been since you were released from the hospital? "not so good, wore out"   Do you understand why you were in the hospital? yes   Do you understand the discharge instructions? yes   Where were you discharged to? Home. Lives with family.    Items Reviewed:  Medications reviewed: yes  Allergies reviewed: yes  Dietary changes reviewed: yes  Referrals reviewed: yes   Functional Questionnaire:   Activities of Daily Living (ADLs):   He states they are independent in the following: ambulation, bathing and hygiene, feeding, continence, grooming, toileting and dressing States they require assistance with the following: None   Any transportation issues/concerns?: no   Any patient concerns? no   Confirmed importance and date/time of follow-up visits scheduled yes  Provider Appointment booked with PCP on 05/18/17 @ 0915  Confirmed with patient if condition begins to worsen call PCP or go to the ER.  Patient was given the office number and encouraged to call back with question or concerns.  : yes

## 2017-05-15 NOTE — Telephone Encounter (Signed)
LM requesting call back to complete TCM and reschedule hosp f/u.

## 2017-05-15 NOTE — Telephone Encounter (Signed)
Noted, TCM nurse phone contact. Signed:  Crissie Sickles, MD           05/15/2017

## 2017-05-18 ENCOUNTER — Ambulatory Visit (INDEPENDENT_AMBULATORY_CARE_PROVIDER_SITE_OTHER): Payer: 59 | Admitting: Family Medicine

## 2017-05-18 ENCOUNTER — Ambulatory Visit: Payer: 59 | Admitting: Endocrinology

## 2017-05-18 ENCOUNTER — Encounter: Payer: Self-pay | Admitting: Family Medicine

## 2017-05-18 VITALS — BP 119/67 | HR 62 | Temp 97.9°F | Resp 16 | Ht 69.0 in | Wt 221.4 lb

## 2017-05-18 DIAGNOSIS — R0789 Other chest pain: Secondary | ICD-10-CM | POA: Diagnosis not present

## 2017-05-18 DIAGNOSIS — N179 Acute kidney failure, unspecified: Secondary | ICD-10-CM

## 2017-05-18 DIAGNOSIS — I10 Essential (primary) hypertension: Secondary | ICD-10-CM | POA: Diagnosis not present

## 2017-05-18 DIAGNOSIS — J180 Bronchopneumonia, unspecified organism: Secondary | ICD-10-CM

## 2017-05-18 DIAGNOSIS — I7789 Other specified disorders of arteries and arterioles: Secondary | ICD-10-CM

## 2017-05-18 LAB — BASIC METABOLIC PANEL
BUN: 16 mg/dL (ref 6–23)
CALCIUM: 9.5 mg/dL (ref 8.4–10.5)
CO2: 25 meq/L (ref 19–32)
Chloride: 106 mEq/L (ref 96–112)
Creatinine, Ser: 1.12 mg/dL (ref 0.40–1.50)
GFR: 70.97 mL/min (ref >60.00)
Glucose, Bld: 118 mg/dL — ABNORMAL HIGH (ref 70–99)
Potassium: 4.4 mEq/L (ref 3.5–5.1)
Sodium: 140 mEq/L (ref 135–145)

## 2017-05-18 NOTE — Progress Notes (Addendum)
05/18/2017  CC:  Chief Complaint  Patient presents with  . Hospitalization Follow-up    TCM    Patient is a 62 y.o. Caucasian male who presents for  hospital follow up, specifically [facility] face-to-face visit. Dates hospitalized: 3/28-3/30, 2019. Days since d/c from hospital: 5 Patient was discharged from hospital to home. Reason for admission to hospital: chest pain r/o ACS.  He had just been in hospital 1-2 d prior for resp illness and dx'd with bilat pneumonia. Date of interactive (phone) contact with patient and/or caregiver: 05/15/17  I have reviewed patient's discharge summary plus pertinent specific notes, labs, and imaging from the hospitalization.   ACS ruled out.  BP improved.  Amlodipine increased from 5mg -10mg  daily.  Continued on same doses of clonidine and ramipril. He was extremely anxious about his blood pressure. Started on fluoxetine 10mg  qd with instructions to increase to 20mg  qd after 1 week, instructed to stop xanax, and was given info in order to contact [facility] services as an outpt. Mild AKI, with Cr increase from 1.05 baseline to 1.33 at d/c. CXR in hosp showed near complete resolution of his pneumonia from 05/09/17 admission.  Just feeling tired and sore; normal bp's since going home from hospital. PO intake not great, esp clear liquids--but he is working on this. Pt is insistent that his anxiety was acute, due to being in hospital and also w/drawal from nicotine. He therefore wants to d/c his fluoxetine that he has only been on for a week. Hydroxyzine caused him to feel like he was coming out of his skin.  Medication reconciliation was done today and patient is taking meds as recommended by discharging hospitalist/specialist.    PMH:  Past Medical History:  Diagnosis Date  . Arthritis   . Atypical chest pain 04/2017   ACS ruled out 05/11/17  . CAP (community acquired pneumonia) 05/07/2017  . Diabetes mellitus without complication ([place])   .  History of thyroidectomy, total 2018   Goiter.  [name] Hx of adenomatous polyp of colon 07/02/2009   As of 09/2016, pt is aware he is overdue for repeat colonoscopy---he knows to call [facility] to set this up.  . Hyperlipidemia    Intol of multiple statins--offered zetia trial 04/2017.  [name] Hypertension   . Lipoma    back  . 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  . Seasonal allergies   . Tachyarrhythmia 1980   age 35 X 2 over 81 month period.  No recurrence since that time.  . Vocal cord paralysis 07/2016   Left vocal cord paralysis noted after thyroid surgery.    PSH:  Past Surgical History:  Procedure Laterality Date  . CARDIAC CATHETERIZATION  1980  . COLONOSCOPY W/ POLYPECTOMY  07/02/2009   tubular adenoma (recall 5 yrs; pt aware he is overdue as of 09/2016 and knows to call [facility] to set this up.  . ESOPHAGOGASTRODUODENOSCOPY  07/02/2009   NORMAL  . EYE SURGERY    . finger amputaion     with reattachement  . LASIK    . THYROIDECTOMY N/A 08/03/2016   Procedure: Total THYROIDECTOMY;  Surgeon: [name], MD;  Location: [place];  Service: ENT;  Laterality: N/A;  Total Thyroidectomy     MEDS:  Outpatient Medications Prior to Visit  Medication Sig Dispense Refill  . amLODipine (NORVASC) 10 MG tablet Take 1 tablet (10 mg total) by mouth daily. 30 tablet 0  . aspirin 81 MG tablet Take 81 mg  by mouth daily.    . cholecalciferol (VITAMIN D) 1000 units tablet Take 1,000 Units by mouth daily.     . cloNIDine (CATAPRES) 0.3 MG tablet Take 1 tablet (0.3 mg total) by mouth 2 (two) times daily. 180 tablet 1  . ezetimibe (ZETIA) 10 MG tablet Take 1 tablet (10 mg total) by mouth daily. 30 tablet 2  . FLUoxetine (PROZAC) 10 MG capsule Take 1 capsule (10 mg total) by mouth daily. After 1 week may start taking 20 mg by mouth daily 49 capsule 0  . levothyroxine (SYNTHROID, LEVOTHROID) 137 MCG tablet TAKE 1 TABLET BY MOUTH  DAILY BEFORE BREAKFAST (Patient taking  differently: TAKE 1 TABLET (18mcg) BY MOUTH  DAILY BEFORE BREAKFAST) 90 tablet 0  . metFORMIN (GLUCOPHAGE) 500 MG tablet TAKE 1 TABLET BY MOUTH TWO  TIMES DAILY WITH A MEAL (Patient taking differently: TAKE 1 TABLET (500mg )  BY MOUTH TWO  TIMES DAILY WITH A MEAL) 180 tablet 0  . ramipril (ALTACE) 10 MG capsule 1 cap po bid (Patient taking differently: Take 10 mg by mouth 2 (two) times daily. ) 180 capsule 3  . senna-docusate (SENOKOT-S) 8.6-50 MG tablet Take 1 tablet by mouth 2 (two) times daily. 60 tablet 0  . benzonatate (TESSALON) 100 MG capsule Take 1 capsule (100 mg total) by mouth every 8 (eight) hours. (Patient not taking: Reported on 05/18/2017) 21 capsule 0  . hydrOXYzine (ATARAX/VISTARIL) 25 MG tablet Take 1 tablet (25 mg total) by mouth 3 (three) times daily as needed for anxiety. (Patient not taking: Reported on 05/18/2017) 30 tablet 0  . levofloxacin (LEVAQUIN) 750 MG tablet Take 1 tablet (750 mg total) by mouth daily. (Patient not taking: Reported on 05/18/2017) 2 tablet 0   No facility-administered medications prior to visit.    EXAM: BP 119/67 (BP Location: Left Arm, Patient Position: Sitting, Cuff Size: Large)   Pulse 62   Temp 97.9 F (36.6 C) (Oral)   Resp 16   Ht 5\' 9"  (1.753 m)   Wt 221 lb 6 oz (100.4 kg)   SpO2 97%   BMI 32.69 kg/m  Gen: Alert, well appearing.  Patient is oriented to person, place, time, and situation. AFFECT: pleasant, lucid thought and speech. AYT:KZSW: no injection, icteris, swelling, or exudate.  EOMI, PERRLA. Mouth: lips without lesion/swelling.  Oral mucosa pink and moist. Oropharynx without erythema, exudate, or swelling.  CV: RRR, no m/r/g.   LUNGS: CTA bilat, nonlabored resps, good aeration in all lung fields. EXT: no clubbing, cyanosis, or edema.    Pertinent labs/imaging Lab Results  Component Value Date   TSH 4.743 (H) 05/12/2017   Lab Results  Component Value Date   WBC 5.3 05/12/2017   HGB 13.4 05/12/2017   HCT 39.5 05/12/2017    MCV 90.2 05/12/2017   PLT 243 05/12/2017   Lab Results  Component Value Date   CREATININE 1.33 (H) 05/13/2017   BUN 12 05/13/2017   NA 139 05/13/2017   K 4.0 05/13/2017   CL 105 05/13/2017   CO2 24 05/13/2017   Lab Results  Component Value Date   ALT 23 05/08/2017   AST 36 05/08/2017   ALKPHOS 49 05/08/2017   BILITOT 0.7 05/08/2017   Lab Results  Component Value Date   CHOL 192 04/21/2017   Lab Results  Component Value Date   HDL 37.10 (L) 04/21/2017   Lab Results  Component Value Date   LDLCALC 150 (H) 12/15/2016   Lab Results  Component Value Date  TRIG 211.0 (H) 04/21/2017   Lab Results  Component Value Date   CHOLHDL 5 04/21/2017   Lab Results  Component Value Date   PSA 1.14 12/15/2016   PSA 1.03 05/05/2009   Lab Results  Component Value Date   HGBA1C 6.5 (H) 05/07/2017   CXR 2 view 05/10/17: EXAM: CHEST - 2 VIEW  COMPARISON:  Two-view chest x-ray and CT of the chest 05/07/2017.  FINDINGS: Heart size is normal. Aeration of the left base is markedly improved. Previously seen right-sided airspace disease is near completely resolved. The left pleural effusion is near completely resolved. Multilevel degenerative changes are again noted in the thoracic spine.  IMPRESSION: 1. Marked improvement in bilateral lobar pneumonia. Persistent left lower lobe airspace disease is evident  ASSESSMENT/PLAN:  1) HTN: now well controlled.  2) Atypical CP: ruled out for ACS.  Likely combination of stress and hypertensive heart dz.  3) Lobar pneumonia: resolved.  4) AKI: monitor BMET today.  Encouraged pt to continue increasing intake of clear liquids.  5) Mild enlargement of thoracic aorta--4.1 cm: detected on recent CTA when in hospital 05/07/17. Refer to CT surgery.  Medical decision making of high complexity was utilized today.  An After Visit Summary was printed and given to the patient.  FOLLOW UP:  3 mo RCI  Signed:  Crissie Sickles, MD            05/18/2017

## 2017-05-19 ENCOUNTER — Encounter: Payer: Self-pay | Admitting: *Deleted

## 2017-06-05 ENCOUNTER — Other Ambulatory Visit: Payer: Self-pay | Admitting: Family Medicine

## 2017-06-21 ENCOUNTER — Encounter: Payer: Self-pay | Admitting: Surgery

## 2017-06-28 ENCOUNTER — Encounter: Payer: Self-pay | Admitting: Surgery

## 2017-07-06 ENCOUNTER — Other Ambulatory Visit: Payer: Self-pay | Admitting: Family Medicine

## 2017-07-06 ENCOUNTER — Other Ambulatory Visit: Payer: Self-pay | Admitting: Endocrinology

## 2017-07-23 ENCOUNTER — Other Ambulatory Visit: Payer: Self-pay | Admitting: Family Medicine

## 2017-07-24 ENCOUNTER — Other Ambulatory Visit: Payer: Self-pay | Admitting: Family Medicine

## 2017-08-02 ENCOUNTER — Encounter: Payer: Self-pay | Admitting: Surgery

## 2017-08-02 ENCOUNTER — Institutional Professional Consult (permissible substitution) (INDEPENDENT_AMBULATORY_CARE_PROVIDER_SITE_OTHER): Payer: 59 | Admitting: Surgery

## 2017-08-02 ENCOUNTER — Other Ambulatory Visit: Payer: Self-pay

## 2017-08-02 VITALS — BP 123/78 | HR 78 | Resp 16 | Ht 69.0 in | Wt 232.0 lb

## 2017-08-02 DIAGNOSIS — I712 Thoracic aortic aneurysm, without rupture, unspecified: Secondary | ICD-10-CM

## 2017-08-02 NOTE — Progress Notes (Signed)
Cardiothoracic Surgery Consultation  PCP is McGowen, Adrian Blackwater, MD Referring Provider is McGowen, Adrian Blackwater, MD  Chief Complaint  Patient presents with  . TAA    CTA CHEST 05/07/17.Marland KitchenMarland KitchenNO ECHO    HPI:  The patient is a 62 year old gentleman with a history of hypertension, hyperlipidemia, and diabetes who was admitted to the hospital briefly in March 2019 for bilateral pneumonia developing after an episode of flu.  He had a CT scan of the chest done due to shortness of breath.  This ruled out pulmonary embolism.  It did show extensive patchy bilateral pulmonary infiltrates consistent with multifocal lung pneumonia.  There was aneurysmal dilation of the ascending aorta to 4.1 cm.  He has completely recovered from his pneumonia.  He reports a strong family history of heart disease but is not sure if anyone had aneurysms.  There is no family history of connective tissue disorder.  Past Medical History:  Diagnosis Date  . Arthritis   . Atypical chest pain 04/2017   ACS ruled out 05/11/17  . CAP (community acquired pneumonia) 05/07/2017  . Diabetes mellitus without complication ([place])   . History of thyroidectomy, total 2018   Goiter.  [name] Hx of adenomatous polyp of colon 07/02/2009   As of 09/2016, pt is aware he is overdue for repeat colonoscopy---he knows to call [facility] to set this up.  . Hyperlipidemia    Intol of multiple statins--offered zetia trial 04/2017.  [name] Hypertension   . Lipoma    back  . 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  . Seasonal allergies   . Tachyarrhythmia 1980   age 50 X 2 over 70 month period.  No recurrence since that time.  . Vocal cord paralysis 07/2016   Left vocal cord paralysis noted after thyroid surgery.    Past Surgical History:  Procedure Laterality Date  . CARDIAC CATHETERIZATION  1980  . COLONOSCOPY W/ POLYPECTOMY  07/02/2009   tubular adenoma (recall 5 yrs; pt aware he is overdue as of 09/2016 and knows to call  [facility] to set this up.  . ESOPHAGOGASTRODUODENOSCOPY  07/02/2009   NORMAL  . EYE SURGERY    . finger amputaion     with reattachement  . LASIK    . THYROIDECTOMY N/A 08/03/2016   Procedure: Total THYROIDECTOMY;  Surgeon: [name], MD;  Location: [facility] OR;  Service: ENT;  Laterality: N/A;  Total Thyroidectomy     Family History  Problem Relation Age of Onset  . Diabetes Mother   . Cancer Mother        lung  . COPD Mother   . Brain cancer Mother   . Arthritis Mother   . Hyperlipidemia Mother   . Heart disease Mother   . Hypertension Mother   . Diabetes Father   . Cancer Father        mesothelioma; skin  . Mental illness Father   . Arthritis Father   . Hyperlipidemia Father   . Heart disease Father   . Hypertension Father   . Heart disease Maternal Grandmother        MI @ 45  . Heart disease Maternal Grandfather        MI @ 70  . Cancer Paternal Grandmother   . Diabetes Paternal Grandmother   . Heart disease Paternal Grandfather        MI @ 62  . Thyroid cancer Maternal Aunt     Social History Social History  Tobacco Use  . Smoking status: Never Smoker  . Smokeless tobacco: Current User    Types: Snuff  Substance Use Topics  . Alcohol use: No  . Drug use: No    Current Outpatient Medications  Medication Sig Dispense Refill  . amLODipine (NORVASC) 10 MG tablet Take 1 tablet (10 mg total) by mouth daily. 30 tablet 0  . aspirin 81 MG tablet Take 81 mg by mouth daily.    . cholecalciferol (VITAMIN D) 1000 units tablet Take 1,000 Units by mouth daily.     . cloNIDine (CATAPRES) 0.3 MG tablet TAKE 1 TABLET BY MOUTH TWO  TIMES DAILY 180 tablet 1  . ezetimibe (ZETIA) 10 MG tablet TAKE 1 TABLET BY MOUTH ONCE DAILY 90 tablet 1  . levothyroxine (SYNTHROID, LEVOTHROID) 137 MCG tablet TAKE 1 TABLET BY MOUTH  DAILY BEFORE BREAKFAST 90 tablet 0  . loratadine (CLARITIN) 10 MG tablet Take 10 mg by mouth daily as needed for allergies.    . metFORMIN (GLUCOPHAGE) 500 MG  tablet TAKE 1 TABLET (500mg )  BY MOUTH TWO  TIMES DAILY WITH A MEAL 180 tablet 1  . ramipril (ALTACE) 10 MG capsule 1 cap po bid (Patient taking differently: Take 10 mg by mouth 2 (two) times daily. ) 180 capsule 3  . senna-docusate (SENOKOT-S) 8.6-50 MG tablet Take 1 tablet by mouth 2 (two) times daily. 60 tablet 0   No current facility-administered medications for this visit.     Allergies  Allergen Reactions  . Hydroxyzine Other (See Comments)    "felt like I was coming out of my skin"  . Tenormin [Atenolol]     Severe "slowing"of functioning  . Tetracycline     Rash Because of a history of documented adverse serious drug reaction;Medi Alert bracelet  is recommended  . Lipitor [Atorvastatin]     D/Ced by him due to Myalgias in Sept 2014  . Codeine     Hallucinations. "I see pink elephants"  . Guaifenesin     Other reaction(s): Other (See Comments) unknown  . Mucinex [Guaifenesin Er] Other (See Comments)    Hallucinations "I see pink elephants"    Review of Systems  Constitutional: Negative.   HENT: Negative.   Eyes: Negative.   Respiratory: Negative for chest tightness and shortness of breath.   Cardiovascular: Negative for chest pain and leg swelling.  Gastrointestinal: Negative.   Endocrine: Negative.   Genitourinary: Negative.   Musculoskeletal: Positive for arthralgias.  Skin: Negative.   Allergic/Immunologic: Negative.   Neurological: Positive for numbness.       Hands and feet  Hematological: Negative.   Psychiatric/Behavioral: The patient is nervous/anxious.     BP 123/78 (BP Location: Left Arm, Patient Position: Sitting, Cuff Size: Large)   Pulse 78   Resp 16   Ht 5\' 9"  (1.753 m)   Wt 232 lb (105.2 kg)   SpO2 97% Comment: ON RA  BMI 34.26 kg/m   Physical Exam  Constitutional: He is oriented to person, place, and time. He appears well-developed and well-nourished. No distress.  HENT:  Head: Normocephalic and atraumatic.  Mouth/Throat: Oropharynx is  clear and moist.  Eyes: Pupils are equal, round, and reactive to light. Conjunctivae and EOM are normal.  Neck: Normal range of motion. Neck supple. No JVD present. No thyromegaly present.  Cardiovascular: Normal rate, regular rhythm and normal heart sounds.  No murmur heard. Pulmonary/Chest: Effort normal and breath sounds normal. No respiratory distress.  Abdominal: Soft. Bowel sounds are normal. There  is no tenderness.  Musculoskeletal: Normal range of motion. He exhibits no edema.  Lymphadenopathy:    He has no cervical adenopathy.  Neurological: He is alert and oriented to person, place, and time.  Skin: Skin is warm and dry.  Psychiatric: He has a normal mood and affect.     Diagnostic Tests:  CLINICAL DATA:  Shortness of breath especially with exertion and lying down, recent flu, history diabetes mellitus, hypertension  EXAM: CT ANGIOGRAPHY CHEST WITH CONTRAST  TECHNIQUE: Multidetector CT imaging of the chest was performed using the standard protocol during bolus administration of intravenous contrast. Multiplanar CT image reconstructions and MIPs were obtained to evaluate the vascular anatomy.  CONTRAST:  45mL ISOVUE-370 IOPAMIDOL (ISOVUE-370) INJECTION 76% IV  COMPARISON:  None  FINDINGS: Cardiovascular: Aneurysmal dilatation of the ascending thoracic aorta 4.1 cm diameter image 37. No aortic dissection. Pulmonary arteries adequately opacified and patent. No evidence of pulmonary embolism. No pericardial effusion.  Mediastinum/Nodes: Esophagus unremarkable. Base of cervical region normal appearance. No thoracic adenopathy.  Lungs/Pleura: Airspace infiltrates throughout LEFT lower lobe with less in LEFT upper lobe/lingula and RIGHT upper lobe consistent with pneumonia. Some of the observed infiltrates are somewhat nodular in appearance especially in RIGHT lung where a focal 12 mm opacity image 39 and 19 mm opacity image 43 in the mid lung are  identified. No pleural effusion or pneumothorax.  Upper Abdomen: Visualized upper abdomen unremarkable  Musculoskeletal: No acute osseous findings.  Review of the MIP images confirms the above findings.  IMPRESSION: No evidence of pulmonary embolism.  Extensive patchy BILATERAL pulmonary infiltrates greater on LEFT consistent with multifocal lung pneumonia.  Since some of these areas of consolidation are nodular in configuration, recommend follow-up noncontrast CT imaging in 3 months to ensure resolution and exclude underlying pulmonary mass/nodule.  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  Recommend annual imaging followup by CTA or MRA. This recommendation follows 2010 ACCF/AHA/AATS/ACR/ASA/SCA/SCAI/SIR/STS/SVM Guidelines for the Diagnosis and Management of Patients with Thoracic Aortic Disease. Circulation. 2010; 121: X646-O032  Aortic aneurysm NOS (ICD10-I71.9).   Electronically Signed   By: Lavonia Dana M.D.   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  His blood pressure is under good control today but he does report a history of labile blood pressure.  I reviewed the CT images with him and answered his questions.  I stressed the importance of good blood pressure control and preventing further enlargement of the aorta and and preventing acute aortic dissection.  His aortic diameter is still well below the 5.5 cm surgical threshold but this will require continued follow-up.  I will plan to see him back in 1 year with a CT scan of the chest to establish stability.   Plan:  Return in 1 year with a CTA of the chest.   I spent 30 minutes performing this consultation and > 50% of this time was spent face to face counseling and coordinating the care of this patient's fusiform ascending aortic aneurysm.  Gaye Pollack, MD [facility] [phone]

## 2017-08-07 ENCOUNTER — Other Ambulatory Visit: Payer: Self-pay | Admitting: Family Medicine

## 2017-08-08 ENCOUNTER — Other Ambulatory Visit: Payer: Self-pay | Admitting: Family Medicine

## 2017-08-08 MED ORDER — AMLODIPINE BESYLATE 10 MG PO TABS: 10.0000 mg | ORAL_TABLET | Freq: Every day | ORAL | 6 refills | Status: DC

## 2017-08-08 NOTE — Telephone Encounter (Signed)
Looks like amlodipine was recently changed by Francia Greaves, DO. Please advise. Thanks.

## 2017-08-08 NOTE — Telephone Encounter (Signed)
Yes, dose was changed to 10mg  qd. I'll deny the 5mg  amlodipine RF request and send in new amlodipine rx for 10mg  qd.

## 2017-08-10 ENCOUNTER — Encounter: Payer: Self-pay | Admitting: Family Medicine

## 2017-08-14 ENCOUNTER — Other Ambulatory Visit (INDEPENDENT_AMBULATORY_CARE_PROVIDER_SITE_OTHER): Payer: 59

## 2017-08-14 DIAGNOSIS — E890 Postprocedural hypothyroidism: Secondary | ICD-10-CM | POA: Diagnosis not present

## 2017-08-14 LAB — T3, FREE: T3, Free: 2.7 pg/mL (ref 2.3–4.2)

## 2017-08-14 LAB — T4, FREE: FREE T4: 1.08 ng/dL (ref 0.60–1.60)

## 2017-08-14 LAB — TSH: TSH: 0.79 u[IU]/mL (ref 0.35–4.50)

## 2017-08-18 ENCOUNTER — Ambulatory Visit (INDEPENDENT_AMBULATORY_CARE_PROVIDER_SITE_OTHER): Payer: 59 | Admitting: Endocrinology

## 2017-08-18 ENCOUNTER — Encounter: Payer: Self-pay | Admitting: Endocrinology

## 2017-08-18 VITALS — BP 108/66 | HR 79 | Ht 69.0 in | Wt 224.6 lb

## 2017-08-18 DIAGNOSIS — E890 Postprocedural hypothyroidism: Secondary | ICD-10-CM | POA: Diagnosis not present

## 2017-08-18 DIAGNOSIS — R682 Dry mouth, unspecified: Secondary | ICD-10-CM | POA: Diagnosis not present

## 2017-08-18 NOTE — Progress Notes (Signed)
Patient ID: Timothy Page, male   DOB: 11-09-1955, 62 y.o.   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  Patient was discharged on 150 g of levothyroxine However since TSH was 0.1 this was reduced down to 137 g on his initial consultation in 7/18 Subsequently TSH has been normal  He was last seen in 11/2016 Overall he feels fairly good with no new symptoms of unusual fatigue or lethargy He does have a little difficulty swallowing but mostly associated with his dry mouth No hoarseness  He is very consistent with taking his levothyroxine before breakfast daily, not on any iron or calcium supplements at the same time  Lab Results  Component Value Date   TSH 0.79 08/14/2017   TSH 4.743 (H) 05/12/2017   TSH 0.70 11/11/2016   FREET4 1.08 08/14/2017   FREET4 1.24 11/11/2016   FREET4 1.38 09/13/2016     DIABETES history:  Patient has had mild diabetes since 2014 when his A1c was 6.5 and highest level of 6.8 in 02/2013 He has been on metformin, currently taking 1 tablet daily He had previously been prescribed metformin twice a day Has been told by his previous PCP not to check his blood sugars  Still his A1c has been checked periodically and is still below 7, last checked in the hospital Still does not check blood sugars at home and has difficulty losing weight   Wt Readings from Last 3 Encounters:  08/18/17 224 lb 9.6 oz (101.9 kg)  08/02/17 232 lb (105.2 kg)  05/18/17 221 lb 6 oz (100.4 kg)    Lab  Results  Component Value Date   HGBA1C 6.5 (H) 05/07/2017   HGBA1C 6.9 (H) 04/21/2017   HGBA1C 6.2 12/15/2016   Lab Results  Component Value Date   MICROALBUR 1.5 09/13/2016   [place] 150 (H) 12/15/2016   CREATININE 1.12 05/18/2017     Allergies as of 08/18/2017      Reactions   Hydroxyzine Other (See Comments)   "felt like I was coming out of my skin"   Tenormin [atenolol]    Severe "slowing"of functioning   Tetracycline    Rash Because of a history of documented adverse serious drug reaction;Medi Alert bracelet  is recommended   Lipitor [atorvastatin]    D/Ced by him due to Myalgias in Sept 2014   Codeine    Hallucinations. "I see pink elephants"   Guaifenesin    Other reaction(s): Other (See Comments) unknown   Mucinex [guaifenesin Er] Other (See Comments)   Hallucinations "I see pink elephants"      Medication List        Accurate as of 08/18/17  4:04 PM. Always use your most recent med list.          amLODipine 10 MG tablet Commonly known as:  NORVASC Take 1 tablet (10 mg total) by mouth daily.   aspirin 81 MG tablet Take 81 mg by mouth daily.   cholecalciferol 1000 units tablet Commonly known  as:  VITAMIN D Take 1,000 Units by mouth daily.   cloNIDine 0.3 MG tablet Commonly known as:  CATAPRES TAKE 1 TABLET BY MOUTH TWO  TIMES DAILY   ezetimibe 10 MG tablet Commonly known as:  ZETIA TAKE 1 TABLET BY MOUTH ONCE DAILY   levothyroxine 137 MCG tablet Commonly known as:  SYNTHROID, LEVOTHROID TAKE 1 TABLET BY MOUTH  DAILY BEFORE BREAKFAST   loratadine 10 MG tablet Commonly known as:  CLARITIN Take 10 mg by mouth daily as needed for allergies.   metFORMIN 500 MG tablet Commonly known as:  GLUCOPHAGE TAKE 1 TABLET (500mg )  BY MOUTH TWO  TIMES DAILY WITH A MEAL   ramipril 10 MG capsule Commonly known as:  ALTACE TAKE 1 CAPSULE BY MOUTH TWO TIMES DAILY   senna-docusate 8.6-50 MG tablet Commonly known as:  Senokot-S Take 1 tablet by mouth 2 (two)  times daily.       Allergies:  Allergies  Allergen Reactions  . Hydroxyzine Other (See Comments)    "felt like I was coming out of my skin"  . Tenormin [Atenolol]     Severe "slowing"of functioning  . Tetracycline     Rash Because of a history of documented adverse serious drug reaction;Medi Alert bracelet  is recommended  . Lipitor [Atorvastatin]     D/Ced by him due to Myalgias in Sept 2014  . Codeine     Hallucinations. "I see pink elephants"  . Guaifenesin     Other reaction(s): Other (See Comments) unknown  . Mucinex [Guaifenesin Er] Other (See Comments)    Hallucinations "I see pink elephants"    Past Medical History:  Diagnosis Date  . Arthritis   . Atypical chest pain 04/2017   ACS ruled out 05/11/17  . CAP (community acquired pneumonia) 05/07/2017  . Diabetes mellitus without complication ([place])   . History of thyroidectomy, total 2018   Goiter.  [name] Hx of adenomatous polyp of colon 07/02/2009   As of 09/2016, pt is aware he is overdue for repeat colonoscopy---he knows to call [facility] to set this up.  . Hyperlipidemia    Intol of multiple statins--offered zetia trial 04/2017.  [name] Hypertension   . Lipoma    back  . 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  . Seasonal allergies   . Tachyarrhythmia 1980   age 51 X 2 over 19 month period.  No recurrence since that time.  . Thoracic aortic aneurysm [facility])    Incidentally discovered, 4.1 cm-->Dr. Cyndia Bent saw pt 07/2017 and plans f/u CT and o/v in 1 yr.  . Vocal cord paralysis 07/2016   Left vocal cord paralysis noted after thyroid surgery.    Past Surgical History:  Procedure Laterality Date  . CARDIAC CATHETERIZATION  1980  . COLONOSCOPY W/ POLYPECTOMY  07/02/2009   tubular adenoma (recall 5 yrs; pt aware he is overdue as of 09/2016 and knows to call [facility] to set this up.  . ESOPHAGOGASTRODUODENOSCOPY  07/02/2009   NORMAL  . EYE SURGERY    . finger amputaion     with  reattachement  . LASIK    . THYROIDECTOMY N/A 08/03/2016   Procedure: Total THYROIDECTOMY;  Surgeon: [name], MD;  Location: [facility] OR;  Service: ENT;  Laterality: N/A;  Total Thyroidectomy     Family History  Problem Relation Age of Onset  . Diabetes Mother   . Cancer Mother        lung  . COPD Mother   .  Brain cancer Mother   . Arthritis Mother   . Hyperlipidemia Mother   . Heart disease Mother   . Hypertension Mother   . Diabetes Father   . Cancer Father        mesothelioma; skin  . Mental illness Father   . Arthritis Father   . Hyperlipidemia Father   . Heart disease Father   . Hypertension Father   . Heart disease Maternal Grandmother        MI @ 28  . Heart disease Maternal Grandfather        MI @ 69  . Cancer Paternal Grandmother   . Diabetes Paternal Grandmother   . Heart disease Paternal Grandfather        MI @ 38  . Thyroid cancer Maternal Aunt     Social History:  reports that he has never smoked. His smokeless tobacco use includes snuff. He reports that he does not drink alcohol or use drugs.    Review of Systems  He is on clonidine 0.3 mg twice daily for hypertension Complaining of significant amount of drowsiness after taking this in the morning and also a lot of dry mouth, is due to see his PCP in follow-up.  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, treated surgically in 07/2016  Patient is on levothyroxine supplementation with 137 g since about 09/2016 With this his TSH is fairly normal although minimally higher when he was hospitalized in March He has several other medical problems may be causing some fatigue  not worse recently He is prescription as directed in the morning before breakfast and TSH is quite normal again  He will follow-up in a year  DIABETES with mild obesity  Discussed that he needs to follow-up with PCP, probably needs to start home monitoring he is being followed occasionally Also because of his obesity may benefit from taking at least 1000 mg of metformin for insulin resistance   FATIGUE: Dry mouth and drowsiness from clonidine, he will discuss with PCP  There are no Patient Instructions on file for this visit.  Elayne Snare 08/18/2017

## 2017-08-21 ENCOUNTER — Ambulatory Visit: Payer: 59 | Admitting: Family Medicine

## 2017-08-24 ENCOUNTER — Ambulatory Visit: Payer: Self-pay | Admitting: Family Medicine

## 2017-08-25 ENCOUNTER — Ambulatory Visit (INDEPENDENT_AMBULATORY_CARE_PROVIDER_SITE_OTHER): Payer: 59 | Admitting: Family Medicine

## 2017-08-25 ENCOUNTER — Encounter: Payer: Self-pay | Admitting: Family Medicine

## 2017-08-25 VITALS — BP 99/64 | HR 68 | Temp 97.9°F | Resp 16 | Ht 69.0 in | Wt 224.4 lb

## 2017-08-25 DIAGNOSIS — E119 Type 2 diabetes mellitus without complications: Secondary | ICD-10-CM

## 2017-08-25 DIAGNOSIS — E7800 Pure hypercholesterolemia, unspecified: Secondary | ICD-10-CM

## 2017-08-25 DIAGNOSIS — I10 Essential (primary) hypertension: Secondary | ICD-10-CM

## 2017-08-25 DIAGNOSIS — I952 Hypotension due to drugs: Secondary | ICD-10-CM

## 2017-08-25 DIAGNOSIS — T50905A Adverse effect of unspecified drugs, medicaments and biological substances, initial encounter: Secondary | ICD-10-CM | POA: Diagnosis not present

## 2017-08-25 NOTE — Progress Notes (Signed)
OFFICE VISIT  08/25/2017   CC:  Chief Complaint  Patient presents with  . Follow-up    RCI, pt is not fasting.    HPI:    Patient is a 62 y.o. Caucasian male who presents for 3 mo f/u HTN, DM 2, HLD.  He feels fine.  Working again.  Playing golf a few days a week now. Says bp at ENT MD office a couple days ago was "too low". Says the clonidine makes him very sleepy and causes dry mouth. He rarely checks his bp at home.  He cannot tell me any home bp numbers. He is a bit of an evasive historian---does not seem purposeful, though.  Recently saw Dr. Dwyane Dee for f/u hypothyroidism.  Says his hips hurt in mornings since getting on zetia.  This symptom was somewhat delayed compared to the statins in the past, but has now become more persistent and severe in the mornings--esp the last 2-3 weeks.    Takes metformin 500mg  bid and does not check home glucose.  ROS: some HA's last few days.  No dizziness.  No snoring or witnessed sleep apneic events.  No CP, SOB, palpitations, or LE swelling.  No abd pain.  No melena.  No polyuria or polydipsia.  Past Medical History:  Diagnosis Date  . Arthritis   . Atypical chest pain 04/2017   ACS ruled out 05/11/17  . CAP (community acquired pneumonia) 05/07/2017  . Diabetes mellitus without complication ([place])   . History of thyroidectomy, total 2018   Goiter.  [name] Hx of adenomatous polyp of colon 07/02/2009   As of 09/2016, pt is aware he is overdue for repeat colonoscopy---he knows to call [facility] to set this up.  . Hyperlipidemia    Intol of multiple statins--offered zetia trial 04/2017.  [name] Hypertension   . Lipoma    back  . 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  . Seasonal allergies   . Tachyarrhythmia 1980   age 74 X 2 over 28 month period.  No recurrence since that time.  . Thoracic aortic aneurysm [facility])    Incidentally discovered, 4.1 cm-->Dr. Cyndia Bent saw pt 07/2017 and plans f/u CT and o/v in 1 yr.  . Vocal  cord paralysis 07/2016   Left vocal cord paralysis noted after thyroid surgery.    Past Surgical History:  Procedure Laterality Date  . CARDIAC CATHETERIZATION  1980  . COLONOSCOPY W/ POLYPECTOMY  07/02/2009   tubular adenoma (recall 5 yrs; pt aware he is overdue as of 09/2016 and knows to call [facility] to set this up.  . ESOPHAGOGASTRODUODENOSCOPY  07/02/2009   NORMAL  . EYE SURGERY    . finger amputaion     with reattachement  . LASIK    . THYROIDECTOMY N/A 08/03/2016   Procedure: Total THYROIDECTOMY;  Surgeon: [name], MD;  Location: [place];  Service: ENT;  Laterality: N/A;  Total Thyroidectomy     Outpatient Medications Prior to Visit  Medication Sig Dispense Refill  . amLODipine (NORVASC) 10 MG tablet Take 1 tablet (10 mg total) by mouth daily. 30 tablet 6  . aspirin 81 MG tablet Take 81 mg by mouth daily.    . cholecalciferol (VITAMIN D) 1000 units tablet Take 1,000 Units by mouth daily.     [name] ezetimibe (ZETIA) 10 MG tablet TAKE 1 TABLET BY MOUTH ONCE DAILY 90 tablet 1  . levothyroxine (SYNTHROID, LEVOTHROID) 137 MCG tablet TAKE 1 TABLET BY MOUTH  DAILY BEFORE  BREAKFAST 90 tablet 0  . loratadine (CLARITIN) 10 MG tablet Take 10 mg by mouth daily as needed for allergies.    . metFORMIN (GLUCOPHAGE) 500 MG tablet TAKE 1 TABLET (500mg )  BY MOUTH TWO  TIMES DAILY WITH A MEAL 180 tablet 1  . ramipril (ALTACE) 10 MG capsule TAKE 1 CAPSULE BY MOUTH TWO TIMES DAILY 180 capsule 1  . senna-docusate (SENOKOT-S) 8.6-50 MG tablet Take 1 tablet by mouth 2 (two) times daily. 60 tablet 0  . cloNIDine (CATAPRES) 0.3 MG tablet TAKE 1 TABLET BY MOUTH TWO  TIMES DAILY 180 tablet 1   No facility-administered medications prior to visit.     Allergies  Allergen Reactions  . Hydroxyzine Other (See Comments)    "felt like I was coming out of my skin"  . Tenormin [Atenolol]     Severe "slowing"of functioning  . Tetracycline     Rash Because of a history of documented adverse serious drug  reaction;Medi Alert bracelet  is recommended  . Lipitor [Atorvastatin]     D/Ced by him due to Myalgias in Sept 2014  . Codeine     Hallucinations. "I see pink elephants"  . Guaifenesin     Other reaction(s): Other (See Comments) unknown  . Mucinex [Guaifenesin Er] Other (See Comments)    Hallucinations "I see pink elephants"    ROS As per HPI  PE: Blood pressure 99/64, pulse 68, temperature 97.9 F (36.6 C), temperature source Oral, resp. rate 16, height 5\' 9"  (1.753 m), weight 224 lb 6 oz (101.8 kg), SpO2 96 %. Body mass index is 33.13 kg/m.  Gen: Alert, well appearing.  Patient is oriented to person, place, time, and situation. AFFECT: pleasant, lucid thought and speech. CV: RRR, no m/r/g.   LUNGS: CTA bilat, nonlabored resps, good aeration in all lung fields. EXT: no clubbing or cyanosis.  He has 1+ pitting edema in both LLs.  LABS:  Lab Results  Component Value Date   TSH 0.79 08/14/2017   Lab Results  Component Value Date   WBC 5.3 05/12/2017   HGB 13.4 05/12/2017   HCT 39.5 05/12/2017   MCV 90.2 05/12/2017   PLT 243 05/12/2017   Lab Results  Component Value Date   CREATININE 1.12 05/18/2017   BUN 16 05/18/2017   NA 140 05/18/2017   K 4.4 05/18/2017   CL 106 05/18/2017   CO2 25 05/18/2017   Lab Results  Component Value Date   ALT 23 05/08/2017   AST 36 05/08/2017   ALKPHOS 49 05/08/2017   BILITOT 0.7 05/08/2017   Lab Results  Component Value Date   CHOL 192 04/21/2017   Lab Results  Component Value Date   HDL 37.10 (L) 04/21/2017   Lab Results  Component Value Date   LDLCALC 150 (H) 12/15/2016   Lab Results  Component Value Date   TRIG 211.0 (H) 04/21/2017   Lab Results  Component Value Date   CHOLHDL 5 04/21/2017   Lab Results  Component Value Date   PSA 1.14 12/15/2016   PSA 1.03 05/05/2009   Lab Results  Component Value Date   HGBA1C 6.5 (H) 05/07/2017    IMPRESSION AND PLAN:  1) HTN: seems overmedicated now. Clonidine  causing side effects--> ween off this med.  Instructions: Stop clonidine in the morning, and take 1/2 of your 0.3mg  tab every evening for 10 days, then take 1/2 tab every other evening for 5 doses, then stop this med completely. Check blood pressure and  heart rate once a day for the next month and write these down--to review with me at next f/u visit in 1 mo.  2) DM 2: Start qod home glucose monitoring (alternate fasting with 2H PP). Hba1c check when he returns for fasting labs soon. Per Dr. Dwyane Dee in Endo, pt would benefit from 1000 mg metformin bid to help with his insulin resistance. However, when I mentioned this today, the patient spoke a lot about how good his diet is now and seemed to want to avoid MORE med if possible.  3) Hyperlipidemia, mixed. Statin intolerance. Now suspected zetia intolerance. Will recheck FLP with fasting labs soon. In the meantime, I instructed pt-->Stop your ezetimibe (zetia) for 15 days.  If hip pains are significantly better then restart this med and see if your hip pains return. If they return, then stop this med forever. If hip pains don't change off of this med for 15d, restart the med and stay on it.  An After Visit Summary was printed and given to the patient.  FOLLOW UP: Return in about 1 month (around 09/22/2017) for f/u HTN/meds.  Signed:  Crissie Sickles, MD           08/25/2017

## 2017-08-25 NOTE — Patient Instructions (Signed)
Stop your ezetimibe (zetia) for 15 days.  If hip pains are significantly better then restart this med and see if your hip pains return. If they return, then stop this med forever. If hip pains don't change off of this med for 15d, restart the med and stay on it.  Stop clonidine in the morning, and take 1/2 of your 0.3mg  tab every evening for 10 days, then take 1/2 tab every other evening for 5 doses, then stop this med completely.  Start monitoring your sugar at home every other day (fasting one day, then 2 hours AFTER your largest meal on the other day). Write these numbers down and bring them in for review at your next f/u visit in 1 mo.  Check blood pressure and heart rate once a day for the next month and write these down--to review with me at next f/u visit in 1 mo.

## 2017-08-28 ENCOUNTER — Encounter: Payer: Self-pay | Admitting: Family Medicine

## 2017-09-01 ENCOUNTER — Encounter: Payer: Self-pay | Admitting: Family Medicine

## 2017-09-01 ENCOUNTER — Other Ambulatory Visit (INDEPENDENT_AMBULATORY_CARE_PROVIDER_SITE_OTHER): Payer: 59

## 2017-09-01 DIAGNOSIS — I10 Essential (primary) hypertension: Secondary | ICD-10-CM | POA: Diagnosis not present

## 2017-09-01 DIAGNOSIS — E7800 Pure hypercholesterolemia, unspecified: Secondary | ICD-10-CM | POA: Diagnosis not present

## 2017-09-01 DIAGNOSIS — E119 Type 2 diabetes mellitus without complications: Secondary | ICD-10-CM

## 2017-09-01 LAB — BASIC METABOLIC PANEL
BUN: 18 mg/dL (ref 6–23)
CO2: 27 mEq/L (ref 19–32)
CREATININE: 1.09 mg/dL (ref 0.40–1.50)
Calcium: 9.2 mg/dL (ref 8.4–10.5)
Chloride: 106 mEq/L (ref 96–112)
GFR: 73.16 mL/min (ref >60.00)
Glucose, Bld: 163 mg/dL — ABNORMAL HIGH (ref 70–99)
Potassium: 4.4 mEq/L (ref 3.5–5.1)
Sodium: 140 mEq/L (ref 135–145)

## 2017-09-01 LAB — LIPID PANEL
Cholesterol: 173 mg/dL (ref 0–200)
HDL: 37 mg/dL — ABNORMAL LOW (ref >39.00)
LDL CALC: 114 mg/dL — AB (ref 0–99)
NonHDL: 135.88
Total CHOL/HDL Ratio: 5
Triglycerides: 110 mg/dL (ref 0.0–149.0)
VLDL: 22 mg/dL (ref 0.0–40.0)

## 2017-09-01 LAB — HEMOGLOBIN A1C: HEMOGLOBIN A1C: 6.6 % — AB (ref 4.6–6.5)

## 2017-09-15 ENCOUNTER — Other Ambulatory Visit: Payer: Self-pay | Admitting: Endocrinology

## 2017-09-22 ENCOUNTER — Ambulatory Visit: Payer: Self-pay | Admitting: Family Medicine

## 2017-10-12 ENCOUNTER — Telehealth: Payer: Self-pay | Admitting: Family Medicine

## 2017-10-12 MED ORDER — AMLODIPINE BESYLATE 10 MG PO TABS: 10.0000 mg | ORAL_TABLET | Freq: Every day | ORAL | 6 refills | Status: DC

## 2017-10-12 NOTE — Telephone Encounter (Signed)
Copied from [place] [id number]. Topic: Quick Communication - See Telephone Encounter >> Oct 12, 2017  2:20 PM Vernona Rieger wrote: CRM for notification. See Telephone encounter for: 10/12/17.  Patient has already contacted [facility].  amLODipine (NORVASC) 10 MG tablet  [facility]

## 2017-11-03 ENCOUNTER — Ambulatory Visit (INDEPENDENT_AMBULATORY_CARE_PROVIDER_SITE_OTHER): Payer: 59 | Admitting: Family Medicine

## 2017-11-03 ENCOUNTER — Encounter: Payer: Self-pay | Admitting: Family Medicine

## 2017-11-03 VITALS — BP 137/71 | HR 73 | Temp 98.2°F | Resp 16 | Ht 69.0 in | Wt 226.1 lb

## 2017-11-03 DIAGNOSIS — E119 Type 2 diabetes mellitus without complications: Secondary | ICD-10-CM

## 2017-11-03 DIAGNOSIS — G4762 Sleep related leg cramps: Secondary | ICD-10-CM | POA: Diagnosis not present

## 2017-11-03 DIAGNOSIS — I10 Essential (primary) hypertension: Secondary | ICD-10-CM

## 2017-11-03 DIAGNOSIS — Z23 Encounter for immunization: Secondary | ICD-10-CM

## 2017-11-03 NOTE — Progress Notes (Signed)
OFFICE VISIT  11/03/2017   CC:  Chief Complaint  Patient presents with  . Follow-up    HTN and Meds    HPI:    Patient is a 62 y.o. Caucasian male who presents for 2 mo f/u DM 2, HTN, HLD.  DM: last visit we had him start checking home glucoses.  His A1c was 6.6%. Takes metformin 500 mg bid. Takes fasting gluc qod, 90s mostly.  Rare check later in evening--same. No   HTN: bp was low at last f/u and he was feeling drowsy/tired on clonidine.  We weened him off clonidine at that time. BP's at home is 130s/70s.  HR 70s.  HLD: he has statin intolerance and last f/u visit he was having similar hip pains on zetia. We decided to try a zetia holiday.  Lipids 08/2017 were a little improved. He says the hip pains resolved completely and he won't restart this med.  Gets nocturnal cramps in lateral calves region and feet/toes at night.  Drinks coffee qAM, tea at lunch and supper, and 4 of the 12 oz waters per day. Urine is almost clear, never dark yellow.  Denies claudication.  ROS: no CP, no SOB, no wheezing, no cough, no dizziness, no HAs, no rashes, no melena/hematochezia.  No polyuria or polydipsia.  No myalgias or arthralgias.   Past Medical History:  Diagnosis Date  . Arthritis   . Atypical chest pain 04/2017   ACS ruled out 05/11/17  . CAP (community acquired pneumonia) 05/07/2017  . Diabetes mellitus without complication ([place])    FOLLOWED BY PCP  . History of thyroidectomy, total 2018   Goiter.  [name] Hx of adenomatous polyp of colon 07/02/2009   As of 09/2016, pt is aware he is overdue for repeat colonoscopy---he knows to call [facility] to set this up.  . Hyperlipidemia    Intol of multiple statins--started zetia 2019 but having same side effects-->doing zetia holiday as of 08/2017.  [name] Hypertension   . Lipoma    back  . 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 Cammy Copa  . Seasonal allergies   . Tachyarrhythmia 1980   age 44 X 2  over 5 month period.  No recurrence since that time.  . Thoracic aortic aneurysm [facility])    Incidentally discovered, 4.1 cm-->Dr. Cyndia Bent saw pt 07/2017 and plans f/u CT and o/v in 1 yr.  . Vocal cord paralysis 07/2016   Left vocal cord paralysis noted after thyroid surgery.    Past Surgical History:  Procedure Laterality Date  . CARDIAC CATHETERIZATION  1980  . COLONOSCOPY W/ POLYPECTOMY  07/02/2009   tubular adenoma (recall 5 yrs; pt aware he is overdue as of 09/2016 and knows to call [facility] to set this up.  . ESOPHAGOGASTRODUODENOSCOPY  07/02/2009   NORMAL  . EYE SURGERY    . finger amputaion     with reattachement  . LASIK    . THYROIDECTOMY N/A 08/03/2016   Procedure: Total THYROIDECTOMY;  Surgeon: [name], MD;  Location: [place];  Service: ENT;  Laterality: N/A;  Total Thyroidectomy     Outpatient Medications Prior to Visit  Medication Sig Dispense Refill  . amLODipine (NORVASC) 10 MG tablet Take 1 tablet (10 mg total) by mouth daily. 30 tablet 6  . aspirin 81 MG tablet Take 81 mg by mouth daily.    . cholecalciferol (VITAMIN D) 1000 units tablet Take 1,000 Units by mouth daily.     [name] levothyroxine (  SYNTHROID, LEVOTHROID) 137 MCG tablet TAKE 1 TABLET BY MOUTH  DAILY BEFORE BREAKFAST 90 tablet 0  . loratadine (CLARITIN) 10 MG tablet Take 10 mg by mouth daily as needed for allergies.    . metFORMIN (GLUCOPHAGE) 500 MG tablet TAKE 1 TABLET (500mg )  BY MOUTH TWO  TIMES DAILY WITH A MEAL 180 tablet 1  . ramipril (ALTACE) 10 MG capsule TAKE 1 CAPSULE BY MOUTH TWO TIMES DAILY 180 capsule 1  . senna-docusate (SENOKOT-S) 8.6-50 MG tablet Take 1 tablet by mouth 2 (two) times daily. 60 tablet 0  . ezetimibe (ZETIA) 10 MG tablet TAKE 1 TABLET BY MOUTH ONCE DAILY (Patient not taking: Reported on 11/03/2017) 90 tablet 1   No facility-administered medications prior to visit.     Allergies  Allergen Reactions  . Atenolol     Severe "slowing"of functioning Severe "slowing"of functioning   . Hydroxyzine Other (See Comments)    "felt like I was coming out of my skin"  . Tetracycline     Rash Because of a history of documented adverse serious drug reaction;Medi Alert bracelet  is recommended Rash Because of a history of documented adverse serious drug reaction;Medi Alert braceletis recommended  . Atorvastatin     D/Ced by him due to Myalgias in Sept 2014 D/Ced by him due to Myalgias in Sept 2014  . Codeine     Hallucinations. "I see pink elephants"  . Guaifenesin     Other reaction(s): Other (See Comments) unknown unknown  . Mucinex [Guaifenesin Er] Other (See Comments)    Hallucinations "I see pink elephants"    ROS As per HPI  PE: Blood pressure 137/71, pulse 73, temperature 98.2 F (36.8 C), temperature source Oral, resp. rate 16, height 5\' 9"  (1.753 m), weight 226 lb 2 oz (102.6 kg), SpO2 98 %. Gen: Alert, well appearing.  Patient is oriented to person, place, time, and situation. AFFECT: pleasant, lucid thought and speech. Foot exam - bilateral normal; no swelling, tenderness or skin or vascular lesions. Color and temperature is normal. Sensation is intact. Peripheral pulses are palpable. Toenails are normal.   LABS:  Lab Results  Component Value Date   TSH 0.79 08/14/2017   Lab Results  Component Value Date   WBC 5.3 05/12/2017   HGB 13.4 05/12/2017   HCT 39.5 05/12/2017   MCV 90.2 05/12/2017   PLT 243 05/12/2017   Lab Results  Component Value Date   CREATININE 1.09 09/01/2017   BUN 18 09/01/2017   NA 140 09/01/2017   K 4.4 09/01/2017   CL 106 09/01/2017   CO2 27 09/01/2017   Lab Results  Component Value Date   ALT 23 05/08/2017   AST 36 05/08/2017   ALKPHOS 49 05/08/2017   BILITOT 0.7 05/08/2017   Lab Results  Component Value Date   CHOL 173 09/01/2017   Lab Results  Component Value Date   HDL 37.00 (L) 09/01/2017   Lab Results  Component Value Date   LDLCALC 114 (H) 09/01/2017   Lab Results  Component Value Date   TRIG  110.0 09/01/2017   Lab Results  Component Value Date   CHOLHDL 5 09/01/2017   Lab Results  Component Value Date   PSA 1.14 12/15/2016   PSA 1.03 05/05/2009   Lab Results  Component Value Date   HGBA1C 6.6 (H) 09/01/2017   IMPRESSION AND PLAN:  1) DM 2, good control per home monitoring. Next HbA1c due in 1 month. Feet exam normal today. BMET today.  2) HTN: The current medical regimen is effective;  continue present plan and medications. Lytes/cr today.  3) HLD: intolerant of statins and zetia.  Patient refuses to try any additional medication to try to treat hyperlipidemia.  4) Nocturnal leg cramps: check BMET/mag/phos. He hydrates adequately. Start 3 oz tonic water qhs and MagOx 500mg  qhs.  An After Visit Summary was printed and given to the patient.  FOLLOW UP: Return in about 3 months (around 02/02/2018) for annual CPE (fasting).  Signed:  Crissie Sickles, MD           11/03/2017

## 2017-11-03 NOTE — Patient Instructions (Signed)
Drink 3 ounces of tonic water every night before bedtime. Buy over the counter Magnesium oxide tabs, 500 mg.  Take 1 tab every evening.

## 2017-11-04 LAB — BASIC METABOLIC PANEL
BUN: 15 mg/dL (ref 7–25)
CALCIUM: 9.3 mg/dL (ref 8.6–10.3)
CHLORIDE: 104 mmol/L (ref 98–110)
CO2: 26 mmol/L (ref 20–32)
Creat: 1.11 mg/dL (ref 0.70–1.25)
Glucose, Bld: 218 mg/dL — ABNORMAL HIGH (ref 65–99)
POTASSIUM: 3.9 mmol/L (ref 3.5–5.3)
Sodium: 139 mmol/L (ref 135–146)

## 2017-11-04 LAB — MAGNESIUM: Magnesium: 1.9 mg/dL (ref 1.5–2.5)

## 2017-11-04 LAB — EXTRA LAV TOP TUBE

## 2017-11-04 LAB — PHOSPHORUS: Phosphorus: 3.2 mg/dL (ref 2.5–4.5)

## 2017-11-10 ENCOUNTER — Other Ambulatory Visit: Payer: Self-pay | Admitting: Endocrinology

## 2017-11-15 ENCOUNTER — Telehealth: Payer: Self-pay | Admitting: Family Medicine

## 2017-11-15 NOTE — Telephone Encounter (Signed)
Patient faxed [employer] Screening form to office today requesting form to be completed and faxed by 11/17/17.  Charge sheet generated, attached to form and placed in md folder at front desk.

## 2017-11-16 NOTE — Telephone Encounter (Signed)
Form filled out and placed on Dr. Idelle Leech desk for review/signature.

## 2017-11-17 NOTE — Telephone Encounter (Signed)
Left detailed message on home vm, okay per DPR.  

## 2017-11-17 NOTE — Telephone Encounter (Signed)
Form signed and faxed

## 2017-12-02 ENCOUNTER — Other Ambulatory Visit: Payer: Self-pay | Admitting: Family Medicine

## 2017-12-05 ENCOUNTER — Other Ambulatory Visit: Payer: Self-pay | Admitting: Family Medicine

## 2017-12-18 ENCOUNTER — Other Ambulatory Visit: Payer: Self-pay | Admitting: Family Medicine

## 2017-12-26 ENCOUNTER — Other Ambulatory Visit: Payer: Self-pay | Admitting: Family Medicine

## 2018-01-18 ENCOUNTER — Other Ambulatory Visit: Payer: Self-pay | Admitting: *Deleted

## 2018-01-18 MED ORDER — BLOOD GLUCOSE METER KIT: PACK | 0 refills | Status: DC

## 2018-01-23 ENCOUNTER — Other Ambulatory Visit: Payer: Self-pay

## 2018-01-23 MED ORDER — GLUCOSE BLOOD VI STRP: ORAL_STRIP | 3 refills | Status: DC

## 2018-01-23 MED ORDER — ONETOUCH DELICA PLUS LANCET30G MISC: 1.0000 | Freq: Every day | 3 refills | Status: DC

## 2018-01-26 ENCOUNTER — Encounter: Payer: Self-pay | Admitting: Family Medicine

## 2018-01-26 ENCOUNTER — Ambulatory Visit (INDEPENDENT_AMBULATORY_CARE_PROVIDER_SITE_OTHER): Payer: 59 | Admitting: Family Medicine

## 2018-01-26 VITALS — BP 118/73 | HR 53 | Temp 98.0°F | Resp 16 | Ht 69.0 in | Wt 230.5 lb

## 2018-01-26 DIAGNOSIS — E669 Obesity, unspecified: Secondary | ICD-10-CM

## 2018-01-26 DIAGNOSIS — E119 Type 2 diabetes mellitus without complications: Secondary | ICD-10-CM

## 2018-01-26 DIAGNOSIS — Z0000 Encounter for general adult medical examination without abnormal findings: Secondary | ICD-10-CM

## 2018-01-26 DIAGNOSIS — I10 Essential (primary) hypertension: Secondary | ICD-10-CM | POA: Diagnosis not present

## 2018-01-26 DIAGNOSIS — E890 Postprocedural hypothyroidism: Secondary | ICD-10-CM

## 2018-01-26 DIAGNOSIS — E7800 Pure hypercholesterolemia, unspecified: Secondary | ICD-10-CM

## 2018-01-26 DIAGNOSIS — Z125 Encounter for screening for malignant neoplasm of prostate: Secondary | ICD-10-CM | POA: Diagnosis not present

## 2018-01-26 LAB — COMPREHENSIVE METABOLIC PANEL
ALT: 13 U/L (ref 0–53)
AST: 11 U/L (ref 0–37)
Albumin: 4.7 g/dL (ref 3.5–5.2)
Alkaline Phosphatase: 80 U/L (ref 39–117)
BUN: 18 mg/dL (ref 6–23)
CO2: 26 meq/L (ref 19–32)
Calcium: 9.6 mg/dL (ref 8.4–10.5)
Chloride: 104 mEq/L (ref 96–112)
Creatinine, Ser: 1.04 mg/dL (ref 0.40–1.50)
GFR: 77.13 mL/min (ref >60.00)
Glucose, Bld: 129 mg/dL — ABNORMAL HIGH (ref 70–99)
Potassium: 4.6 mEq/L (ref 3.5–5.1)
Sodium: 139 mEq/L (ref 135–145)
Total Bilirubin: 0.5 mg/dL (ref 0.2–1.2)
Total Protein: 6.9 g/dL (ref 6.0–8.3)

## 2018-01-26 LAB — TSH: TSH: 1.12 u[IU]/mL (ref 0.35–4.50)

## 2018-01-26 LAB — CBC WITH DIFFERENTIAL/PLATELET
BASOS ABS: 0.1 10*3/uL (ref 0.0–0.1)
BASOS PCT: 0.9 % (ref 0.0–3.0)
EOS ABS: 0.2 10*3/uL (ref 0.0–0.7)
Eosinophils Relative: 2.4 % (ref 0.0–5.0)
HEMATOCRIT: 44.1 % (ref 39.0–52.0)
HEMOGLOBIN: 14.7 g/dL (ref 13.0–17.0)
LYMPHS PCT: 22.2 % (ref 12.0–46.0)
Lymphs Abs: 1.7 10*3/uL (ref 0.7–4.0)
MCHC: 33.3 g/dL (ref 30.0–36.0)
MCV: 92.1 fl (ref 78.0–100.0)
MONO ABS: 0.4 10*3/uL (ref 0.1–1.0)
Monocytes Relative: 5.7 % (ref 3.0–12.0)
Neutro Abs: 5.2 10*3/uL (ref 1.4–7.7)
Neutrophils Relative %: 68.8 % (ref 43.0–77.0)
Platelets: 258 10*3/uL (ref 150.0–400.0)
RBC: 4.79 Mil/uL (ref 4.22–5.81)
RDW: 13.3 % (ref 11.5–15.5)
WBC: 7.6 10*3/uL (ref 4.0–10.5)

## 2018-01-26 LAB — PSA: PSA: 0.96 ng/mL (ref 0.10–4.00)

## 2018-01-26 LAB — HEMOGLOBIN A1C: Hgb A1c MFr Bld: 6.5 % (ref 4.6–6.5)

## 2018-01-26 LAB — LIPID PANEL
Cholesterol: 194 mg/dL (ref 0–200)
HDL: 38 mg/dL — ABNORMAL LOW (ref >39.00)
LDL Cholesterol: 124 mg/dL — ABNORMAL HIGH (ref 0–99)
NonHDL: 155.69
Total CHOL/HDL Ratio: 5
Triglycerides: 160 mg/dL — ABNORMAL HIGH (ref 0.0–149.0)
VLDL: 32 mg/dL (ref 0.0–40.0)

## 2018-01-26 NOTE — Progress Notes (Signed)
Office Note 01/26/2018  CC:  Chief Complaint  Patient presents with  . Annual Exam    Pt is fasting.     HPI:  Timothy Page is a 62 y.o. White male who is here for annual health maintenance exam. Doing fine, no acute complaints.  Exercise: plays golf 3 days a week. Diet: doing fairly healthy diet. Dental: he is overdue for exams. Eyes: currently looking for new eye MD.  Past Medical History:  Diagnosis Date  . Arthritis   . Atypical chest pain 04/2017   ACS ruled out 05/11/17  . CAP (community acquired pneumonia) 05/07/2017  . Diabetes mellitus without complication ([place])    FOLLOWED BY PCP  . History of thyroidectomy, total 2018   Goiter.  [name] Hx of adenomatous polyp of colon 07/02/2009   As of 09/2016, pt is aware he is overdue for repeat colonoscopy---he knows to call [facility] to set this up.  . Hyperlipidemia    Intol of multiple statins--started zetia 2019 but having same side effects-->doing zetia holiday as of 08/2017.  [name] Hypertension   . Lipoma    back  . 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 Cammy Copa  . Seasonal allergies   . Tachyarrhythmia 1980   age 74 X 2 over 4 month period.  No recurrence since that time.  . Thoracic aortic aneurysm [facility])    Incidentally discovered, 4.1 cm-->Dr. Cyndia Bent saw pt 07/2017 and plans f/u CT and o/v in 1 yr.  . Vocal cord paralysis 07/2016   Left vocal cord paralysis noted after thyroid surgery.    Past Surgical History:  Procedure Laterality Date  . CARDIAC CATHETERIZATION  1980  . COLONOSCOPY W/ POLYPECTOMY  07/02/2009   tubular adenoma (recall 5 yrs; pt aware he is overdue as of 09/2016 and knows to call [facility] to set this up.  . ESOPHAGOGASTRODUODENOSCOPY  07/02/2009   NORMAL  . EYE SURGERY    . finger amputaion     with reattachement  . LASIK    . THYROIDECTOMY N/A 08/03/2016   Procedure: Total THYROIDECTOMY;  Surgeon: [name], MD;  Location: [facility] OR;   Service: ENT;  Laterality: N/A;  Total Thyroidectomy     Family History  Problem Relation Age of Onset  . Diabetes Mother   . Cancer Mother        lung  . COPD Mother   . Brain cancer Mother   . Arthritis Mother   . Hyperlipidemia Mother   . Heart disease Mother   . Hypertension Mother   . Diabetes Father   . Cancer Father        mesothelioma; skin  . Mental illness Father   . Arthritis Father   . Hyperlipidemia Father   . Heart disease Father   . Hypertension Father   . Heart disease Maternal Grandmother        MI @ 54  . Heart disease Maternal Grandfather        MI @ 62  . Cancer Paternal Grandmother   . Diabetes Paternal Grandmother   . Heart disease Paternal Grandfather        MI @ 18  . Thyroid cancer Maternal Aunt     Social History   Socioeconomic History  . Marital status: Married    Spouse name: Not on file  . Number of children: Not on file  . Years of education: Not on file  . Highest education level: Not on file  Occupational History  . Not on file  Social Needs  . Financial resource strain: Not on file  . Food insecurity:    Worry: Not on file    Inability: Not on file  . Transportation needs:    Medical: Not on file    Non-medical: Not on file  Tobacco Use  . Smoking status: Never Smoker  . Smokeless tobacco: Current User    Types: Snuff  Substance and Sexual Activity  . Alcohol use: No  . Drug use: No  . Sexual activity: Not on file  Lifestyle  . Physical activity:    Days per week: Not on file    Minutes per session: Not on file  . Stress: Not on file  Relationships  . Social connections:    Talks on phone: Not on file    Gets together: Not on file    Attends religious service: Not on file    Active member of club or organization: Not on file    Attends meetings of clubs or organizations: Not on file    Relationship status: Not on file  . Intimate partner violence:    Fear of current or ex partner: Not on file    Emotionally  abused: Not on file    Physically abused: Not on file    Forced sexual activity: Not on file  Other Topics Concern  . Not on file  Social History Narrative   Married, 2 daughters, 1 grandson that lives with him.   Educ: some college   Occup: retired from the [employer].   No T/A/Ds.    Outpatient Medications Prior to Visit  Medication Sig Dispense Refill  . amLODipine (NORVASC) 10 MG tablet Take 1 tablet (10 mg total) by mouth daily. 30 tablet 6  . aspirin 81 MG tablet Take 81 mg by mouth daily.    . cholecalciferol (VITAMIN D) 1000 units tablet Take 1,000 Units by mouth daily.     [name] glucose blood (ONE TOUCH ULTRA TEST) test strip Use to check blood sugar daily 100 each 3  . Lancets (ONETOUCH DELICA PLUS [id number]) MISC 1 each by Does not apply route daily. 100 each 3  . levothyroxine (SYNTHROID, LEVOTHROID) 137 MCG tablet TAKE 1 TABLET BY MOUTH  DAILY BEFORE BREAKFAST 90 tablet 0  . loratadine (CLARITIN) 10 MG tablet Take 10 mg by mouth daily as needed for allergies.    . metFORMIN (GLUCOPHAGE) 500 MG tablet TAKE 1 TABLET BY MOUTH TWO  TIMES DAILY WITH A MEAL 180 tablet 1  . ramipril (ALTACE) 10 MG capsule TAKE 1 CAPSULE BY MOUTH TWO TIMES DAILY 180 capsule 1  . senna-docusate (SENOKOT-S) 8.6-50 MG tablet Take 1 tablet by mouth 2 (two) times daily. 60 tablet 0  . blood glucose meter kit and supplies Use to check blood sugar once daily. (Patient not taking: Reported on 01/26/2018) 1 each 0  . ezetimibe (ZETIA) 10 MG tablet TAKE 1 TABLET BY MOUTH ONCE DAILY (Patient not taking: Reported on 11/03/2017) 90 tablet 1   No facility-administered medications prior to visit.     Allergies  Allergen Reactions  . Atenolol     Severe "slowing"of functioning Severe "slowing"of functioning  . Hydroxyzine Other (See Comments)    "felt like I was coming out of my skin"  . Tetracycline     Rash Because of a history of documented adverse serious drug reaction;Medi Alert bracelet  is  recommended Rash Because of a history of documented adverse serious  drug reaction;Medi Alert braceletis recommended  . Atorvastatin     D/Ced by him due to Myalgias in Sept 2014 D/Ced by him due to Myalgias in Sept 2014  . Codeine     Hallucinations. "I see pink elephants"  . Guaifenesin     Other reaction(s): Other (See Comments) unknown unknown  . Mucinex [Guaifenesin Er] Other (See Comments)    Hallucinations "I see pink elephants"    ROS Review of Systems  Constitutional: Negative for appetite change, chills, fatigue and fever.  HENT: Negative for congestion, dental problem, ear pain and sore throat.   Eyes: Negative for discharge, redness and visual disturbance.  Respiratory: Negative for cough, chest tightness, shortness of breath and wheezing.   Cardiovascular: Negative for chest pain, palpitations and leg swelling.  Gastrointestinal: Negative for abdominal pain, blood in stool, diarrhea, nausea and vomiting.  Genitourinary: Negative for difficulty urinating, dysuria, flank pain, frequency, hematuria and urgency.  Musculoskeletal: Negative for arthralgias, back pain, joint swelling, myalgias and neck stiffness.  Skin: Negative for pallor and rash.  Neurological: Negative for dizziness, speech difficulty, weakness and headaches.  Hematological: Negative for adenopathy. Does not bruise/bleed easily.  Psychiatric/Behavioral: Negative for confusion and sleep disturbance. The patient is not nervous/anxious.     PE; Blood pressure 118/73, pulse (!) 53, temperature 98 F (36.7 C), temperature source Oral, resp. rate 16, height '5\' 9"'  (1.753 m), weight 230 lb 8 oz (104.6 kg), SpO2 98 %. Body mass index is 34.04 kg/m.  Gen: Alert, well appearing.  Patient is oriented to person, place, time, and situation. AFFECT: pleasant, lucid thought and speech. ENT: Ears: EACs clear, normal epithelium.  TMs with good light reflex and landmarks bilaterally.  Eyes: no injection, icteris,  swelling, or exudate.  EOMI, PERRLA. Nose: no drainage or turbinate edema/swelling.  No injection or focal lesion.  Mouth: lips without lesion/swelling.  Oral mucosa pink and moist.  Dentition intact and without obvious caries or gingival swelling.  Oropharynx without erythema, exudate, or swelling.  Neck: supple/nontender.  No LAD, mass, or TM.  Carotid pulses 2+ bilaterally, without bruits. CV: RRR, no m/r/g.   LUNGS: CTA bilat, nonlabored resps, good aeration in all lung fields. ABD: soft, NT, ND, BS normal.  No hepatospenomegaly or mass.  No bruits. EXT: no clubbing, cyanosis, or edema.  Musculoskeletal: no joint swelling, erythema, warmth, or tenderness.  ROM of all joints intact. Skin - no sores or suspicious lesions or rashes or color changes Rectal exam: negative without mass, lesions or tenderness, PROSTATE EXAM: smooth and symmetric without nodules or tenderness.   Pertinent labs:  Lab Results  Component Value Date   TSH 0.79 08/14/2017   Lab Results  Component Value Date   WBC 5.3 05/12/2017   HGB 13.4 05/12/2017   HCT 39.5 05/12/2017   MCV 90.2 05/12/2017   PLT 243 05/12/2017   Lab Results  Component Value Date   CREATININE 1.11 11/03/2017   BUN 15 11/03/2017   NA 139 11/03/2017   K 3.9 11/03/2017   CL 104 11/03/2017   CO2 26 11/03/2017   Lab Results  Component Value Date   ALT 23 05/08/2017   AST 36 05/08/2017   ALKPHOS 49 05/08/2017   BILITOT 0.7 05/08/2017   Lab Results  Component Value Date   CHOL 173 09/01/2017   Lab Results  Component Value Date   HDL 37.00 (L) 09/01/2017   Lab Results  Component Value Date   LDLCALC 114 (H) 09/01/2017   Lab Results  Component Value Date   TRIG 110.0 09/01/2017   Lab Results  Component Value Date   CHOLHDL 5 09/01/2017   Lab Results  Component Value Date   PSA 1.14 12/15/2016   PSA 1.03 05/05/2009   Lab Results  Component Value Date   HGBA1C 6.6 (H) 09/01/2017    ASSESSMENT AND PLAN:   Health  maintenance exam: Reviewed age and gender appropriate health maintenance issues (prudent diet, regular exercise, health risks of tobacco and excessive alcohol, use of seatbelts, fire alarms in home, use of sunscreen).  Also reviewed age and gender appropriate health screening as well as vaccine recommendations. Vaccines: all UTD.  Shingrix discussed--> he declined this. Labs: fasting HP, HbA1c, PSA. Prostate ca screening: DRE normal today , PSA. Colon ca screening: last colonoscopy was 2011 (+aden polyps)-->he is overdue for repeat.  He will call GI to arrange.  An After Visit Summary was printed and given to the patient.  FOLLOW UP:  Return in about 3 months (around 04/27/2018) for routine chronic illness f/u.  Signed:  Crissie Sickles, MD           01/26/2018

## 2018-01-26 NOTE — Patient Instructions (Signed)

## 2018-01-29 ENCOUNTER — Other Ambulatory Visit: Payer: Self-pay | Admitting: Family Medicine

## 2018-01-29 MED ORDER — PRAVASTATIN SODIUM 20 MG PO TABS: 20.0000 mg | ORAL_TABLET | Freq: Every day | ORAL | 2 refills | Status: DC

## 2018-02-03 ENCOUNTER — Other Ambulatory Visit: Payer: Self-pay | Admitting: Endocrinology

## 2018-03-17 ENCOUNTER — Other Ambulatory Visit: Payer: Self-pay | Admitting: Family Medicine

## 2018-04-15 DIAGNOSIS — N201 Calculus of ureter: Secondary | ICD-10-CM

## 2018-04-15 HISTORY — DX: Calculus of ureter: N20.1

## 2018-04-16 ENCOUNTER — Other Ambulatory Visit: Payer: Self-pay | Admitting: Endocrinology

## 2018-04-16 ENCOUNTER — Other Ambulatory Visit: Payer: Self-pay | Admitting: Family Medicine

## 2018-04-16 MED ORDER — BLOOD GLUCOSE METER KIT: PACK | 0 refills | Status: DC

## 2018-04-16 NOTE — Telephone Encounter (Signed)
Rx printed, signed and faxed to [facility].

## 2018-04-26 ENCOUNTER — Other Ambulatory Visit: Payer: Self-pay

## 2018-04-27 ENCOUNTER — Other Ambulatory Visit: Payer: Self-pay

## 2018-04-27 ENCOUNTER — Emergency Department (HOSPITAL_COMMUNITY)
Admission: EM | Admit: 2018-04-27 | Discharge: 2018-04-27 | Disposition: A | Discharge status: Home / Self Care | Payer: 59 | Attending: Emergency Medicine | Admitting: Emergency Medicine

## 2018-04-27 ENCOUNTER — Ambulatory Visit: Payer: Self-pay | Admitting: Family Medicine

## 2018-04-27 ENCOUNTER — Emergency Department (HOSPITAL_COMMUNITY): Payer: 59

## 2018-04-27 ENCOUNTER — Encounter (HOSPITAL_COMMUNITY): Payer: Self-pay

## 2018-04-27 DIAGNOSIS — R1030 Lower abdominal pain, unspecified: Secondary | ICD-10-CM | POA: Diagnosis present

## 2018-04-27 DIAGNOSIS — N179 Acute kidney failure, unspecified: Secondary | ICD-10-CM | POA: Diagnosis not present

## 2018-04-27 DIAGNOSIS — E119 Type 2 diabetes mellitus without complications: Secondary | ICD-10-CM | POA: Diagnosis not present

## 2018-04-27 DIAGNOSIS — I10 Essential (primary) hypertension: Secondary | ICD-10-CM | POA: Insufficient documentation

## 2018-04-27 DIAGNOSIS — Z7982 Long term (current) use of aspirin: Secondary | ICD-10-CM | POA: Insufficient documentation

## 2018-04-27 DIAGNOSIS — E785 Hyperlipidemia, unspecified: Secondary | ICD-10-CM | POA: Insufficient documentation

## 2018-04-27 DIAGNOSIS — Z79899 Other long term (current) drug therapy: Secondary | ICD-10-CM | POA: Insufficient documentation

## 2018-04-27 DIAGNOSIS — N23 Unspecified renal colic: Secondary | ICD-10-CM | POA: Diagnosis not present

## 2018-04-27 DIAGNOSIS — I2510 Atherosclerotic heart disease of native coronary artery without angina pectoris: Secondary | ICD-10-CM | POA: Diagnosis not present

## 2018-04-27 DIAGNOSIS — Z7984 Long term (current) use of oral hypoglycemic drugs: Secondary | ICD-10-CM | POA: Insufficient documentation

## 2018-04-27 LAB — CBC WITH DIFFERENTIAL/PLATELET
Abs Immature Granulocytes: 0.08 10*3/uL — ABNORMAL HIGH (ref 0.00–0.07)
Basophils Absolute: 0.1 10*3/uL (ref 0.0–0.1)
Basophils Relative: 1 %
Eosinophils Absolute: 0 10*3/uL (ref 0.0–0.5)
Eosinophils Relative: 0 %
HCT: 42.8 % (ref 39.0–52.0)
Hemoglobin: 14.1 g/dL (ref 13.0–17.0)
Immature Granulocytes: 1 %
Lymphocytes Relative: 5 %
Lymphs Abs: 0.8 10*3/uL (ref 0.7–4.0)
MCH: 30.1 pg (ref 26.0–34.0)
MCHC: 32.9 g/dL (ref 30.0–36.0)
MCV: 91.5 fL (ref 80.0–100.0)
Monocytes Absolute: 0.6 10*3/uL (ref 0.1–1.0)
Monocytes Relative: 4 %
NEUTROS PCT: 89 %
Neutro Abs: 13.8 10*3/uL — ABNORMAL HIGH (ref 1.7–7.7)
Platelets: 250 10*3/uL (ref 150–400)
RBC: 4.68 MIL/uL (ref 4.22–5.81)
RDW: 12.4 % (ref 11.5–15.5)
WBC: 15.3 10*3/uL — AB (ref 4.0–10.5)
nRBC: 0 % (ref 0.0–0.2)

## 2018-04-27 LAB — COMPREHENSIVE METABOLIC PANEL
ALT: 16 U/L (ref 0–44)
AST: 20 U/L (ref 15–41)
Albumin: 4.5 g/dL (ref 3.5–5.0)
Alkaline Phosphatase: 86 U/L (ref 38–126)
Anion gap: 11 (ref 5–15)
BUN: 16 mg/dL (ref 8–23)
CHLORIDE: 106 mmol/L (ref 98–111)
CO2: 20 mmol/L — AB (ref 22–32)
Calcium: 9.1 mg/dL (ref 8.9–10.3)
Creatinine, Ser: 1.49 mg/dL — ABNORMAL HIGH (ref 0.61–1.24)
GFR calc Af Amer: 58 mL/min — ABNORMAL LOW (ref >60)
GFR calc non Af Amer: 50 mL/min — ABNORMAL LOW (ref >60)
Glucose, Bld: 227 mg/dL — ABNORMAL HIGH (ref 70–99)
POTASSIUM: 3.9 mmol/L (ref 3.5–5.1)
Sodium: 137 mmol/L (ref 135–145)
Total Bilirubin: 0.8 mg/dL (ref 0.3–1.2)
Total Protein: 6.9 g/dL (ref 6.5–8.1)

## 2018-04-27 LAB — URINALYSIS, ROUTINE W REFLEX MICROSCOPIC
Bilirubin Urine: NEGATIVE
Glucose, UA: >=500 mg/dL — AB
Ketones, ur: 20 mg/dL — AB
LEUKOCYTE UA: NEGATIVE
Nitrite: NEGATIVE
Protein, ur: NEGATIVE mg/dL
Specific Gravity, Urine: 1.012 (ref 1.005–1.030)
pH: 6 (ref 5.0–8.0)

## 2018-04-27 LAB — LIPASE, BLOOD: Lipase: 32 U/L (ref 11–51)

## 2018-04-27 MED ORDER — OXYCODONE-ACETAMINOPHEN 5-325 MG PO TABS: 1.0000 | ORAL_TABLET | Freq: Four times a day (QID) | ORAL | 0 refills | Status: DC | PRN

## 2018-04-27 MED ORDER — SODIUM CHLORIDE 0.9 % IV BOLUS
1000.0000 mL | Freq: Once | INTRAVENOUS | Status: AC
  Administered 2018-04-27: 1000 mL via INTRAVENOUS

## 2018-04-27 MED ORDER — HYDROMORPHONE HCL 1 MG/ML IJ SOLN
1.0000 mg | Freq: Once | INTRAMUSCULAR | Status: AC
  Administered 2018-04-27: 1 mg via INTRAVENOUS
  Filled 2018-04-27: qty 1

## 2018-04-27 MED ORDER — TAMSULOSIN HCL 0.4 MG PO CAPS: 0.4000 mg | ORAL_CAPSULE | Freq: Every day | ORAL | 0 refills | Status: DC

## 2018-04-27 MED ORDER — ONDANSETRON 4 MG PO TBDP: ORAL_TABLET | ORAL | 0 refills | Status: DC

## 2018-04-27 MED ORDER — OXYCODONE-ACETAMINOPHEN 5-325 MG PO TABS: 2.0000 | ORAL_TABLET | Freq: Once | ORAL | Status: DC

## 2018-04-27 MED ORDER — TAMSULOSIN HCL 0.4 MG PO CAPS
0.4000 mg | ORAL_CAPSULE | Freq: Once | ORAL | Status: AC
  Administered 2018-04-27: 0.4 mg via ORAL

## 2018-04-27 MED ORDER — KETOROLAC TROMETHAMINE 30 MG/ML IJ SOLN
15.0000 mg | Freq: Once | INTRAMUSCULAR | Status: AC
  Administered 2018-04-27: 15 mg via INTRAVENOUS
  Filled 2018-04-27: qty 1

## 2018-04-27 MED ORDER — ONDANSETRON HCL 4 MG/2ML IJ SOLN
4.0000 mg | Freq: Once | INTRAMUSCULAR | Status: AC
  Administered 2018-04-27: 4 mg via INTRAVENOUS
  Filled 2018-04-27: qty 2

## 2018-04-27 NOTE — ED Triage Notes (Signed)
Pt from home via ems; c/o R sided flank pain that radiates to groin; sudden onset while trying to urinate; unable to urinate this am; no hx kidney stones; denies recent illness  PTA: 200 mcg fentanyl 19.2 mg ketamine  No relief, 10/10 pain  222/112 P 78 RR 20 97% RA Et CO2 33 CBG 240

## 2018-04-27 NOTE — Discharge Instructions (Addendum)
Take motrin 400 mg every 6 hrs for the next 2 days   Take percocet for severe pain   Take zofran for nausea   Take flomax to help you urinate   Stay hydrated   Repeat kidney function test in a week with your doctor  See urologist for follow up   Return to ER if you have worse abdominal pain, vomiting, fever

## 2018-04-27 NOTE — ED Provider Notes (Signed)
[facility] EMERGENCY DEPARTMENT Provider Note   CSN: [id number] Arrival date & time: 04/27/18  1038    History   Chief Complaint No chief complaint on file.   HPI Timothy Page is a 63 y.o. male history of thoracic aneurysm that is managed conservatively, diabetes, MI here presenting with lower abdominal pain.  He states that he woke up around 5 AM was feeling fine and went into his truck about an hour later, and had acute onset of sharp right lower quadrant pain radiated to the right flank area.  He states that he has not urinated since then but denies any blood in his urine or pain with urination prior to that.  Patient denies any fevers or chills or testicular pain.  Patient called EMS and was noted to be hypertensive 222/112.  He also was given 200 mcg of fentanyl as well as in 19 mg of ketamine with no relief.  He has no history of kidney stones.  Patient does have a 4 cm thoracic aneurysm but no operation was performed and was managed conservatively.  She does not have any chest pain or upper back pain currently.    The history is provided by the patient.    Past Medical History:  Diagnosis Date  . Arthritis   . Atypical chest pain 04/2017   ACS ruled out 05/11/17  . CAP (community acquired pneumonia) 05/07/2017  . Diabetes mellitus without complication ([place])    FOLLOWED BY PCP  . History of thyroidectomy, total 2018   Goiter.  [name] Hx of adenomatous polyp of colon 07/02/2009   As of 09/2016, pt is aware he is overdue for repeat colonoscopy---he knows to call [facility] to set this up.  . Hyperlipidemia    Intol of multiple statins--started zetia 2019 but having same side effects-->doing zetia holiday as of 08/2017.  [name] Hypertension   . Lipoma    back  . 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 Cammy Copa  . Seasonal allergies   . Tachyarrhythmia 1980   age 89 X 2 over 22 month period.  No recurrence since that  time.  . Thoracic aortic aneurysm [facility])    Incidentally discovered, 4.1 cm-->Dr. Cyndia Bent saw pt 07/2017 and plans f/u CT and o/v in 1 yr.  . Vocal cord paralysis 07/2016   Left vocal cord paralysis noted after thyroid surgery.    Patient Active Problem List   Diagnosis Date Noted  . Adjustment disorder with mixed anxiety and depressed mood   . Chest pain 05/11/2017  . Anxiety about health 05/11/2017  . Community acquired pneumonia 05/07/2017  . Lung nodule   . Abdominal aortic aneurysm (AAA) without rupture ([place])   . Hyperlipidemia   . 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 Vocal cord paralysis 08/12/2016  . Goiter 07/31/2016  . Cough 03/06/2015  . Controlled type 2 diabetes mellitus without complication, without long-term current use of insulin ([place]) 08/09/2012  . Essential hypertension 08/07/2012  . Mixed hyperlipidemia 08/07/2012  . CAD (coronary artery disease) 04/28/2011  . Unspecified adverse effect of unspecified drug, medicinal and biological substance 04/28/2011  . Lipoma 03/01/2011  . Tremor 03/01/2011  . Hx of adenomatous polyp of colon 07/02/2009  . UNSPECIFIED PROSTATITIS 05/05/2009    Past Surgical History:  Procedure Laterality Date  . CARDIAC CATHETERIZATION  1980  . COLONOSCOPY W/ POLYPECTOMY  07/02/2009   tubular adenoma (recall 5 yrs; pt aware he is overdue as of  09/2016 and knows to call [facility] to set this up.  . ESOPHAGOGASTRODUODENOSCOPY  07/02/2009   NORMAL  . EYE SURGERY    . finger amputaion     with reattachement  . LASIK    . THYROIDECTOMY N/A 08/03/2016   Procedure: Total THYROIDECTOMY;  Surgeon: [name], MD;  Location: [facility];  Service: ENT;  Laterality: N/A;  Total Thyroidectomy         Home Medications    Prior to Admission medications   Medication Sig Start Date End Date Taking? Authorizing Provider  amLODipine (NORVASC) 10 MG tablet TAKE 1 TABLET BY MOUTH  DAILY Patient taking differently: Take 10 mg by  mouth daily.  03/19/18  Yes McGowen, Adrian Blackwater, MD  aspirin 81 MG tablet Take 81 mg by mouth daily.   Yes [provider]  cholecalciferol (VITAMIN D) 1000 units tablet Take 1,000 Units by mouth daily.    Yes [provider]  levothyroxine (SYNTHROID, LEVOTHROID) 137 MCG tablet TAKE 1 TABLET BY MOUTH  DAILY BEFORE BREAKFAST Patient taking differently: Take 137 mcg by mouth daily before breakfast.  04/16/18  Yes Elayne Snare, MD  loratadine (CLARITIN) 10 MG tablet Take 10 mg by mouth daily as needed for allergies.   Yes [provider]  metFORMIN (GLUCOPHAGE) 500 MG tablet TAKE 1 TABLET BY MOUTH TWO  TIMES DAILY WITH A MEAL Patient taking differently: Take 500 mg by mouth 2 (two) times daily with a meal.  12/06/17  Yes McGowen, Adrian Blackwater, MD  pravastatin (PRAVACHOL) 20 MG tablet Take 1 tablet (20 mg total) by mouth daily. 01/29/18  Yes McGowen, Adrian Blackwater, MD  ramipril (ALTACE) 10 MG capsule TAKE 1 CAPSULE BY MOUTH TWO TIMES DAILY Patient taking differently: Take 10 mg by mouth 2 (two) times daily.  12/27/17  Yes McGowen, Adrian Blackwater, MD  senna-docusate (SENOKOT-S) 8.6-50 MG tablet Take 1 tablet by mouth 2 (two) times daily. 08/05/16  Yes Elgergawy, Silver Huguenin, MD  blood glucose meter kit and supplies Use to check blood sugar once daily 04/16/18   McGowen, Adrian Blackwater, MD  glucose blood (ONE TOUCH ULTRA TEST) test strip Use to check blood sugar daily 01/23/18   McGowen, Adrian Blackwater, MD  Lancets [facility] DELICA PLUS [id number]) [place] 1 each by Does not apply route daily. 01/23/18   McGowenAdrian Blackwater, MD    Family History Family History  Problem Relation Age of Onset  . Diabetes Mother   . Cancer Mother        lung  . COPD Mother   . Brain cancer Mother   . Arthritis Mother   . Hyperlipidemia Mother   . Heart disease Mother   . Hypertension Mother   . Diabetes Father   . Cancer Father        mesothelioma; skin  . Mental illness Father   . Arthritis Father   . Hyperlipidemia Father    . Heart disease Father   . Hypertension Father   . Heart disease Maternal Grandmother        MI @ 55  . Heart disease Maternal Grandfather        MI @ 2  . Cancer Paternal Grandmother   . Diabetes Paternal Grandmother   . Heart disease Paternal Grandfather        MI @ 39  . Thyroid cancer Maternal Aunt     Social History Social History   Tobacco Use  . Smoking status: Never Smoker  . Smokeless tobacco:  Current User    Types: Snuff  Substance Use Topics  . Alcohol use: No  . Drug use: No     Allergies   Atenolol; Hydroxyzine; Tetracycline; Atorvastatin; Codeine; Guaifenesin; and Mucinex [guaifenesin er]   Review of Systems Review of Systems  Gastrointestinal: Positive for abdominal pain.  Genitourinary: Positive for flank pain.  All other systems reviewed and are negative.    Physical Exam Updated Vital Signs BP (!) 177/97   Pulse 80   Temp 98.2 F (36.8 C) (Oral)   Resp (!) 0   Ht '5\' 11"'  (1.803 m)   Wt 99.8 kg   SpO2 94%   BMI 30.68 kg/m   Physical Exam Vitals signs and nursing note reviewed.  Constitutional:      Appearance: Normal appearance.     Comments: Uncomfortable, writhing around in pain   HENT:     Head: Normocephalic.     Mouth/Throat:     Mouth: Mucous membranes are moist.  Eyes:     Extraocular Movements: Extraocular movements intact.     Pupils: Pupils are equal, round, and reactive to light.  Neck:     Musculoskeletal: Normal range of motion.  Cardiovascular:     Rate and Rhythm: Normal rate and regular rhythm.     Pulses: Normal pulses.     Heart sounds: Normal heart sounds.  Pulmonary:     Effort: Pulmonary effort is normal.     Breath sounds: Normal breath sounds.  Abdominal:     General: Abdomen is flat.     Comments: + RLQ and R CVAT   Genitourinary:    Comments: Testicles- nontender bilaterally, good cremasteric reflex bilaterally  Musculoskeletal: Normal range of motion.  Skin:    General: Skin is warm.      Capillary Refill: Capillary refill takes less than 2 seconds.  Neurological:     General: No focal deficit present.     Mental Status: He is alert and oriented to person, place, and time.  Psychiatric:        Mood and Affect: Mood normal.      ED Treatments / Results  Labs (all labs ordered are listed, but only abnormal results are displayed) Labs Reviewed  CBC WITH DIFFERENTIAL/PLATELET - Abnormal; Notable for the following components:      Result Value   WBC 15.3 (*)    Neutro Abs 13.8 (*)    Abs Immature Granulocytes 0.08 (*)    All other components within normal limits  COMPREHENSIVE METABOLIC PANEL - Abnormal; Notable for the following components:   CO2 20 (*)    Glucose, Bld 227 (*)    Creatinine, Ser 1.49 (*)    GFR calc non Af Amer 50 (*)    GFR calc Af Amer 58 (*)    All other components within normal limits  URINALYSIS, ROUTINE W REFLEX MICROSCOPIC - Abnormal; Notable for the following components:   Color, Urine STRAW (*)    Glucose, UA >=500 (*)    Hgb urine dipstick MODERATE (*)    Ketones, ur 20 (*)    Bacteria, UA RARE (*)    All other components within normal limits  URINE CULTURE  LIPASE, BLOOD    EKG None  Radiology Ct Renal Stone Study  Result Date: 04/27/2018 CLINICAL DATA:  Sudden onset right flank pain radiating to the groin this morning, followed by inability to urinate. EXAM: CT ABDOMEN AND PELVIS WITHOUT CONTRAST TECHNIQUE: Multidetector CT imaging of the abdomen and pelvis was  performed following the standard protocol without IV contrast. COMPARISON:  04/07/2003. FINDINGS: Lower chest: Minimal bibasilar atelectasis/scarring. Hepatobiliary: Probable sludge in the gallbladder without wall thickening or pericholecystic fluid. Normal appearing liver. Pancreas: Unremarkable. No pancreatic ductal dilatation or surrounding inflammatory changes. Spleen: Normal in size without focal abnormality. Adrenals/Urinary Tract: Normal appearing adrenal glands. 2 mm  distal right ureteral calculus just proximal to the ureterovesical junction. Minimal dilatation of the right renal collecting system and ureter with extensive perinephric and periureteric soft tissue stranding. Unremarkable left kidney, left ureter and urinary bladder. Stomach/Bowel: Small hiatal hernia. Unremarkable small bowel, colon and appendix. Vascular/Lymphatic: Mild atheromatous arterial calcifications. No enlarged lymph nodes. Reproductive: Minimally enlarged prostate gland containing coarse calcifications. Other: Tiny bilateral inguinal hernias containing fat. Musculoskeletal: Lumbar and lower thoracic spine degenerative changes. IMPRESSION: 1. 2 mm distal right ureteral calculus causing minimal right hydronephrosis and hydroureter with extensive perinephric and periureteric soft tissue stranding. 2. Small hiatal hernia. Electronically Signed   By: Claudie Revering M.D.   On: 04/27/2018 12:15    Procedures Procedures (including critical care time)  Medications Ordered in ED Medications  ketorolac (TORADOL) 30 MG/ML injection 15 mg (has no administration in time range)  tamsulosin (FLOMAX) capsule 0.4 mg (has no administration in time range)  oxyCODONE-acetaminophen (PERCOCET/ROXICET) 5-325 MG per tablet 2 tablet (has no administration in time range)  sodium chloride 0.9 % bolus 1,000 mL (1,000 mLs Intravenous New Bag/Given 04/27/18 1057)  HYDROmorphone (DILAUDID) injection 1 mg (1 mg Intravenous Given 04/27/18 1100)  ondansetron (ZOFRAN) injection 4 mg (4 mg Intravenous Given 04/27/18 1058)  HYDROmorphone (DILAUDID) injection 1 mg (1 mg Intravenous Given 04/27/18 1233)     Initial Impression / Assessment and Plan / ED Course  I have reviewed the triage vital signs and the nursing notes.  Pertinent labs & imaging results that were available during my care of the patient were reviewed by me and considered in my medical decision making (see chart for details).       HATIM HOMANN is a 63  y.o. male here with RLQ pain, R flank pain, hypertension. Hx of HTN. I think likely renal colic. Testicular exam unremarkable. He has hx of thoracic aneurysm but no dissection. Will get CT renal stone study. If negative, will get CTA. Will get labs, UA. Will give pain meds.   2:39 PM WBC 15. CT renal stone showed 2 mm distal ureteral obstruction with R hydro. UA showed no UTI. Urine culture sent. Pain controlled now. He does have mild AKI. Will dc home with low dose motrin, percocet, flomax, urology follow up. Repeat BMP in a week.    Final Clinical Impressions(s) / ED Diagnoses   Final diagnoses:  None    ED Discharge Orders    None       Drenda Freeze, MD 04/27/18 [phone]

## 2018-04-28 LAB — URINE CULTURE: CULTURE: NO GROWTH

## 2018-04-29 ENCOUNTER — Emergency Department (HOSPITAL_COMMUNITY): Payer: 59

## 2018-04-29 ENCOUNTER — Other Ambulatory Visit: Payer: Self-pay

## 2018-04-29 ENCOUNTER — Encounter (HOSPITAL_COMMUNITY): Payer: Self-pay | Admitting: Emergency Medicine

## 2018-04-29 ENCOUNTER — Emergency Department (HOSPITAL_COMMUNITY)
Admission: EM | Admit: 2018-04-29 | Discharge: 2018-04-29 | Disposition: A | Discharge status: Home / Self Care | Payer: 59 | Attending: Emergency Medicine | Admitting: Emergency Medicine

## 2018-04-29 DIAGNOSIS — Z79899 Other long term (current) drug therapy: Secondary | ICD-10-CM | POA: Diagnosis not present

## 2018-04-29 DIAGNOSIS — Z7984 Long term (current) use of oral hypoglycemic drugs: Secondary | ICD-10-CM | POA: Insufficient documentation

## 2018-04-29 DIAGNOSIS — I2510 Atherosclerotic heart disease of native coronary artery without angina pectoris: Secondary | ICD-10-CM | POA: Insufficient documentation

## 2018-04-29 DIAGNOSIS — R1031 Right lower quadrant pain: Secondary | ICD-10-CM | POA: Diagnosis present

## 2018-04-29 DIAGNOSIS — N201 Calculus of ureter: Secondary | ICD-10-CM | POA: Diagnosis not present

## 2018-04-29 DIAGNOSIS — Z7982 Long term (current) use of aspirin: Secondary | ICD-10-CM | POA: Diagnosis not present

## 2018-04-29 DIAGNOSIS — I10 Essential (primary) hypertension: Secondary | ICD-10-CM | POA: Diagnosis not present

## 2018-04-29 DIAGNOSIS — E119 Type 2 diabetes mellitus without complications: Secondary | ICD-10-CM | POA: Insufficient documentation

## 2018-04-29 LAB — URINALYSIS, ROUTINE W REFLEX MICROSCOPIC
Bacteria, UA: NONE SEEN
Bilirubin Urine: NEGATIVE
Glucose, UA: NEGATIVE mg/dL
Ketones, ur: 20 mg/dL — AB
Leukocytes,Ua: NEGATIVE
Nitrite: NEGATIVE
Protein, ur: NEGATIVE mg/dL
Specific Gravity, Urine: 1.008 (ref 1.005–1.030)
pH: 5 (ref 5.0–8.0)

## 2018-04-29 LAB — COMPREHENSIVE METABOLIC PANEL WITH GFR
ALT: 15 U/L (ref 0–44)
AST: 15 U/L (ref 15–41)
Albumin: 4.2 g/dL (ref 3.5–5.0)
Alkaline Phosphatase: 83 U/L (ref 38–126)
Anion gap: 12 (ref 5–15)
BUN: 16 mg/dL (ref 8–23)
CO2: 22 mmol/L (ref 22–32)
Calcium: 9.1 mg/dL (ref 8.9–10.3)
Chloride: 99 mmol/L (ref 98–111)
Creatinine, Ser: 1.99 mg/dL — ABNORMAL HIGH (ref 0.61–1.24)
GFR calc Af Amer: 41 mL/min — ABNORMAL LOW
GFR calc non Af Amer: 35 mL/min — ABNORMAL LOW
Glucose, Bld: 138 mg/dL — ABNORMAL HIGH (ref 70–99)
Potassium: 3.7 mmol/L (ref 3.5–5.1)
Sodium: 133 mmol/L — ABNORMAL LOW (ref 135–145)
Total Bilirubin: 1.2 mg/dL (ref 0.3–1.2)
Total Protein: 7.2 g/dL (ref 6.5–8.1)

## 2018-04-29 LAB — CBC
HCT: 40.3 % (ref 39.0–52.0)
Hemoglobin: 13.8 g/dL (ref 13.0–17.0)
MCH: 30.9 pg (ref 26.0–34.0)
MCHC: 34.2 g/dL (ref 30.0–36.0)
MCV: 90.2 fL (ref 80.0–100.0)
Platelets: 243 K/uL (ref 150–400)
RBC: 4.47 MIL/uL (ref 4.22–5.81)
RDW: 12.1 % (ref 11.5–15.5)
WBC: 13.9 K/uL — ABNORMAL HIGH (ref 4.0–10.5)
nRBC: 0 % (ref 0.0–0.2)

## 2018-04-29 LAB — LIPASE, BLOOD: Lipase: 30 U/L (ref 11–51)

## 2018-04-29 MED ORDER — SODIUM CHLORIDE 0.9 % IV BOLUS
1000.0000 mL | Freq: Once | INTRAVENOUS | Status: AC
  Administered 2018-04-29: 1000 mL via INTRAVENOUS

## 2018-04-29 MED ORDER — SODIUM CHLORIDE 0.9% FLUSH: 3.0000 mL | Freq: Once | INTRAVENOUS | Status: DC

## 2018-04-29 MED ORDER — ONDANSETRON HCL 4 MG/2ML IJ SOLN
4.0000 mg | Freq: Once | INTRAMUSCULAR | Status: AC
  Administered 2018-04-29: 4 mg via INTRAVENOUS
  Filled 2018-04-29: qty 2

## 2018-04-29 MED ORDER — KETOROLAC TROMETHAMINE 30 MG/ML IJ SOLN
30.0000 mg | Freq: Once | INTRAMUSCULAR | Status: AC
  Administered 2018-04-29: 30 mg via INTRAVENOUS
  Filled 2018-04-29: qty 1

## 2018-04-29 MED ORDER — MAGNESIUM CITRATE PO SOLN: 1.0000 | Freq: Once | ORAL | 0 refills | Status: AC

## 2018-04-29 NOTE — ED Notes (Signed)
Discharge instructions and prescription discussed with Pt. Pt verbalized understanding. Pt stable and ambulatory.    

## 2018-04-29 NOTE — ED Provider Notes (Signed)
[facility] EMERGENCY DEPARTMENT Provider Note   CSN: [id number] Arrival date & time: 04/29/18  1636    History   Chief Complaint Chief Complaint  Patient presents with   Abdominal Pain    HPI Timothy Page is a 63 y.o. male.     HPI  63 year old male with a history of hypertension, hyperlipidemia, thoracic aortic aneurysm, diabetes, recent presentation 2 days ago with diagnosis of right-sided nephrolithiasis, presents with concern for continuing and severe right lower quadrant pain.  Patient had come in on 13 March with concern for right lower quadrant and flank pain, and had CT completed which showed a 2 mm distal ureteral stone on the right near the UVJ.  He has been taking oxycodone, Flomax, and ibuprofen with continued severe pain.  Reports the pain is out of control, and he cannot get comfortable.  Reports movements do make the pain worse.  Denies fevers.  Reports nausea, but he is not wanting to take the nausea medication.  Reports he has not had a bowel movement in 3 days.  No dysuria.  Reports abdominal discomfort, low appetite, severe right-sided abdominal pain.  Past Medical History:  Diagnosis Date   Arthritis    Atypical chest pain 04/2017   ACS ruled out 05/11/17   CAP (community acquired pneumonia) 05/07/2017   Diabetes mellitus without complication ([place])    FOLLOWED BY PCP   History of thyroidectomy, total 2018   Goiter.   Hx of adenomatous polyp of colon 07/02/2009   As of 09/2016, pt is aware he is overdue for repeat colonoscopy---he knows to call [facility] to set this up.   Hyperlipidemia    Intol of multiple statins--started zetia 2019 but having same side effects-->doing zetia holiday as of 08/2017.   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 Cammy Copa   Seasonal allergies    Tachyarrhythmia 59   age 35 X 2 over 46 month period.  No recurrence  since that time.   Thoracic aortic aneurysm [facility])    Incidentally discovered, 4.1 cm-->Dr. Cyndia Bent saw pt 07/2017 and plans f/u CT and o/v in 1 yr.   Vocal cord paralysis 07/2016   Left vocal cord paralysis noted after thyroid surgery.    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 08/12/2016   Vocal cord paralysis 08/12/2016   Goiter 07/31/2016   Cough 03/06/2015   Controlled type 2 diabetes mellitus without complication, without long-term current use of insulin ([place]) 08/09/2012   Essential hypertension 08/07/2012   Mixed hyperlipidemia 08/07/2012   CAD (coronary artery disease) 04/28/2011   Unspecified adverse effect of unspecified drug, medicinal and biological substance 04/28/2011   Lipoma 03/01/2011   Tremor 03/01/2011   Hx of adenomatous polyp of colon 07/02/2009   UNSPECIFIED PROSTATITIS 05/05/2009    Past Surgical History:  Procedure Laterality Date   [place]   COLONOSCOPY W/ POLYPECTOMY  07/02/2009   tubular adenoma (recall 5 yrs; pt aware he is overdue as of 09/2016 and knows to call [facility] to set this up.   ESOPHAGOGASTRODUODENOSCOPY  07/02/2009   NORMAL   EYE SURGERY     finger amputaion     with reattachement   LASIK     THYROIDECTOMY N/A 08/03/2016   Procedure: Total THYROIDECTOMY;  Surgeon: [name], MD;  Location: [facility];  Service: ENT;  Laterality: N/A;  Total Thyroidectomy         Home Medications    Prior to Admission medications   Medication Sig Start Date End Date Taking? Authorizing Provider  amLODipine (NORVASC) 10 MG tablet TAKE 1 TABLET BY MOUTH  DAILY Patient taking differently: Take  10 mg by mouth daily.  03/19/18   McGowen, Adrian Blackwater, MD  aspirin 81 MG tablet Take 81 mg by mouth daily.    [provider]  blood glucose meter kit and supplies Use to check blood sugar once daily 04/16/18   McGowen, Adrian Blackwater, MD  cholecalciferol (VITAMIN D) 1000 units tablet Take 1,000 Units by mouth daily.     [provider]  glucose blood (ONE TOUCH ULTRA TEST) test strip Use to check blood sugar daily 01/23/18   McGowen, Adrian Blackwater, MD  Lancets [facility] DELICA PLUS [id number]) [place] 1 each by Does not apply route daily. 01/23/18   McGowen, Adrian Blackwater, MD  levothyroxine (SYNTHROID, LEVOTHROID) 137 MCG tablet TAKE 1 TABLET BY MOUTH  DAILY BEFORE BREAKFAST Patient taking differently: Take 137 mcg by mouth daily before breakfast.  04/16/18   Elayne Snare, MD  loratadine (CLARITIN) 10 MG tablet Take 10 mg by mouth daily as needed for allergies.    [provider]  metFORMIN (GLUCOPHAGE) 500 MG tablet TAKE 1 TABLET BY MOUTH TWO  TIMES DAILY WITH A MEAL Patient taking differently: Take 500 mg by mouth 2 (two) times daily with a meal.  12/06/17   McGowen, Adrian Blackwater, MD  ondansetron (ZOFRAN ODT) 4 MG disintegrating tablet 19m ODT q4 hours prn nausea/vomit 04/27/18   YDrenda Freeze MD  oxyCODONE-acetaminophen (PERCOCET) 5-325 MG tablet Take 1 tablet by mouth every 6 (six) hours as needed. 04/27/18   YDrenda Freeze MD  pravastatin (PRAVACHOL) 20 MG tablet Take 1 tablet (20 mg total) by mouth daily. 01/29/18   McGowen, PAdrian Blackwater MD  ramipril (ALTACE) 10 MG capsule TAKE 1 CAPSULE BY MOUTH TWO TIMES DAILY Patient taking differently: Take 10 mg by mouth 2 (two) times daily.  12/27/17   McGowen, PAdrian Blackwater MD  senna-docusate (SENOKOT-S) 8.6-50 MG tablet Take 1 tablet by mouth 2 (two) times daily. 08/05/16   Elgergawy, DSilver Huguenin MD  tamsulosin (FLOMAX) 0.4 MG CAPS capsule Take 1 capsule (0.4 mg total) by mouth daily. 04/27/18   YDrenda Freeze MD    Family History Family History    Problem Relation Age of Onset   Diabetes Mother    Cancer Mother        lung   COPD Mother    Brain cancer Mother    Arthritis Mother    Hyperlipidemia Mother    Heart disease Mother    Hypertension Mother    Diabetes Father    Cancer Father        mesothelioma; skin   Mental illness Father    Arthritis Father    Hyperlipidemia Father    Heart disease Father    Hypertension Father    Heart disease Maternal Grandmother        MI @ 869  Heart disease Maternal Grandfather        MI @ 875  Cancer Paternal Grandmother    Diabetes Paternal Grandmother    Heart disease Paternal Grandfather        MI @ 74   Thyroid cancer Maternal Aunt     Social History Social History   Tobacco Use   Smoking status: Never Smoker   Smokeless tobacco: Current User    Types: Snuff  Substance Use Topics   Alcohol use: No   Drug use: No     Allergies   Atenolol; Hydroxyzine; Tetracycline; Atorvastatin; Codeine; Guaifenesin; and Mucinex [guaifenesin er]   Review of Systems Review of Systems  Constitutional: Negative for fever.  HENT: Negative for sore throat.   Eyes: Negative for visual disturbance.  Respiratory: Negative for shortness of breath.   Cardiovascular: Negative for chest pain.  Gastrointestinal: Positive for abdominal pain, constipation and nausea. Negative for vomiting.  Genitourinary: Positive for flank pain. Negative for difficulty urinating and dysuria.  Musculoskeletal: Negative for back pain and neck stiffness.  Skin: Negative for rash.  Neurological: Negative for syncope and headaches.     Physical Exam Updated Vital Signs BP 131/71    Pulse 68    Temp 98.6 F (37 C) (Oral)    Resp (!) 22    Ht '5\' 11"'  (1.803 m)    Wt 104.3 kg    SpO2 94%    BMI 32.08 kg/m   Physical Exam Vitals signs and nursing note reviewed.  Constitutional:      General: He is in acute distress.     Appearance: He is well-developed. He is ill-appearing. He is  not diaphoretic.  HENT:     Head: Normocephalic and atraumatic.  Eyes:     Conjunctiva/sclera: Conjunctivae normal.  Neck:     Musculoskeletal: Normal range of motion.  Cardiovascular:     Rate and Rhythm: Normal rate and regular rhythm.     Heart sounds: Normal heart sounds. No murmur. No friction rub. No gallop.   Pulmonary:     Effort: Pulmonary effort is normal. No respiratory distress.     Breath sounds: Normal breath sounds. No wheezing or rales.  Abdominal:     General: There is no distension.     Palpations: Abdomen is soft.     Tenderness: There is abdominal tenderness. There is guarding. Positive signs include McBurney's sign.  Skin:    General: Skin is warm and dry.  Neurological:     Mental Status: He is alert and oriented to person, place, and time.      ED Treatments / Results  Labs (all labs ordered are listed, but only abnormal results are displayed) Labs Reviewed  COMPREHENSIVE METABOLIC PANEL - Abnormal; Notable for the following components:      Result Value   Sodium 133 (*)    Glucose, Bld 138 (*)    Creatinine, Ser 1.99 (*)    GFR calc non Af Amer 35 (*)    GFR calc Af Amer 41 (*)    All other components within normal limits  CBC - Abnormal; Notable for the following components:   WBC 13.9 (*)    All other components within normal limits  URINALYSIS, ROUTINE W REFLEX MICROSCOPIC - Abnormal; Notable for the following components:   Color, Urine STRAW (*)    Hgb urine dipstick SMALL (*)    Ketones, ur 20 (*)    All other components within normal limits  LIPASE, BLOOD    EKG None  Radiology Ct Renal Stone Study  Result Date: 04/29/2018 CLINICAL DATA:  Partial right lower quadrant pain.  Nausea. EXAM: CT ABDOMEN AND PELVIS WITHOUT CONTRAST TECHNIQUE: Multidetector CT imaging of the abdomen and pelvis was performed following the standard protocol without IV contrast. COMPARISON:  CT abdomen pelvis 04/27/2018 FINDINGS: Lower chest: Normal heart  size. Dependent atelectasis within the bilateral lower lobes. No pleural effusion. Hepatobiliary: Liver is diffusely low in attenuation. Gallbladder is unremarkable. No intrahepatic or extrahepatic biliary ductal dilatation. Pancreas: Unremarkable Spleen: Unremarkable Adrenals/Urinary Tract: Normal adrenal glands. The right kidney is mildly enlarged. Mild right hydroureteronephrosis to the level of the distal right ureter were there is an obstructing 2 mm stone (image 78; series 3). Fat stranding and fluid along the course of the right ureter. Urinary bladder is unremarkable. Stomach/Bowel: Normal morphology of the stomach. No evidence for small bowel obstruction. No free intraperitoneal air. Vascular/Lymphatic: Normal caliber abdominal aorta. Peripheral calcified atherosclerotic plaque. No retroperitoneal lymphadenopathy. Reproductive: Heterogeneous prostate. Other: Small bilateral fat containing inguinal hernias. Musculoskeletal: Lumbar spine degenerative changes. No aggressive or acute appearing osseous lesions. IMPRESSION: Persistent 2 mm obstructing stone within the distal right ureter resulting in mild right hydronephrosis and perinephric/periureteric fat stranding. Hepatic steatosis. Electronically Signed   By: Lovey Newcomer M.D.   On: 04/29/2018 17:53    Procedures Procedures (including critical care time)  Medications Ordered in ED Medications  sodium chloride flush (NS) 0.9 % injection 3 mL (has no administration in time range)  sodium chloride 0.9 % bolus 1,000 mL (0 mLs Intravenous Stopped 04/29/18 1917)  ondansetron (ZOFRAN) injection 4 mg (4 mg Intravenous Given 04/29/18 1827)  ketorolac (TORADOL) 30 MG/ML injection 30 mg (30 mg Intravenous Given 04/29/18 1826)  sodium chloride 0.9 % bolus 1,000 mL (0 mLs Intravenous Stopped 04/29/18 2116)     Initial Impression / Assessment and Plan / ED Course  I have reviewed the triage vital signs and the nursing notes.  Pertinent labs & imaging  results that were available during my care of the patient were reviewed by me and considered in my medical decision making (see chart for details).        63 year old male with a history of hypertension, hyperlipidemia, thoracic aortic aneurysm, diabetes, recent presentation 2 days ago with diagnosis of right-sided nephrolithiasis, presents with concern for continuing and severe right lower quadrant pain.  Initially discussed symptoms likely related to known kidney stone, however he has severe tenderness on exam and will CT again to evaluate for other complications.  CT completed again shows distal right ureteral stone near UVJ.  Give IV fluids, toradol with some improvement however continued severe pain.   UA shows no evidence of infection and culture done 2 days ago negative. Cr increased to 1.99 from 1.5 from 1 previously in 01/2018.    Given persistent pain, discussed with Dr. Gloriann Loan of Urology who recommends patient remain npo at [facility] and see him in his office tomorrow at [facility].  The patient is comfortable with this and appreciates the close follow up with Dr. Gloriann Loan.   While I feel his pain is secondary to ureteral stone, he also has constipation as contributor to discomfort that is related to his oxycodone. Recommend senna, colace, miralax, and given rx for mag citrate if continuing constipation. Given AKI, discussed recommendation to not take NSAIDs until he is directed further by his physician.    Final Clinical Impressions(s) / ED Diagnoses   Final diagnoses:  Right ureteral stone    ED Discharge Orders         Ordered  magnesium citrate SOLN   Once     04/29/18 2123           Gareth Morgan, MD 04/30/18 [phone]

## 2018-04-29 NOTE — ED Triage Notes (Signed)
Pt seen in ED on Friday for kidney stone.  Reports pain RLQ is worse, nausea, and unable to have BM.

## 2018-04-29 NOTE — Discharge Instructions (Addendum)
Recommend hydration, high fiber diet and recommended miralax, senna and colace.  You may start with 1-2 caps of miralax/day, however if you have severe constipation you may take 4 caps in one 32oz bottle of gatorade or similar. If no relief with this regimen, you may consider the mag citrate I have prescribed or over the counter enema/suppository. Overall, I suspect your pain is secondary to your kidney stone and less likely primary constipation, however I suspect having an improved bowel regimen while you are taking narcotic medications will help you feel better.   Do not take NSAIDs at this time given your change in kidney function.

## 2018-04-30 ENCOUNTER — Ambulatory Visit (HOSPITAL_COMMUNITY): Payer: 59 | Admitting: Anesthesiology

## 2018-04-30 ENCOUNTER — Other Ambulatory Visit: Payer: Self-pay | Admitting: Urology

## 2018-04-30 ENCOUNTER — Telehealth: Payer: Self-pay | Admitting: *Deleted

## 2018-04-30 ENCOUNTER — Encounter (HOSPITAL_COMMUNITY)
Admission: AD | Disposition: A | Discharge status: Home / Self Care | Payer: Self-pay | Source: Ambulatory Visit | Attending: Urology

## 2018-04-30 ENCOUNTER — Ambulatory Visit (HOSPITAL_COMMUNITY): Payer: 59

## 2018-04-30 ENCOUNTER — Other Ambulatory Visit: Payer: Self-pay

## 2018-04-30 ENCOUNTER — Ambulatory Visit (HOSPITAL_COMMUNITY)
Admission: AD | Admit: 2018-04-30 | Discharge: 2018-04-30 | Disposition: A | Discharge status: Home / Self Care | Payer: 59 | Source: Ambulatory Visit | Attending: Urology | Admitting: Urology

## 2018-04-30 ENCOUNTER — Encounter (HOSPITAL_COMMUNITY): Payer: Self-pay | Admitting: Emergency Medicine

## 2018-04-30 DIAGNOSIS — Z794 Long term (current) use of insulin: Secondary | ICD-10-CM | POA: Insufficient documentation

## 2018-04-30 DIAGNOSIS — Z8673 Personal history of transient ischemic attack (TIA), and cerebral infarction without residual deficits: Secondary | ICD-10-CM | POA: Diagnosis not present

## 2018-04-30 DIAGNOSIS — E7800 Pure hypercholesterolemia, unspecified: Secondary | ICD-10-CM | POA: Diagnosis not present

## 2018-04-30 DIAGNOSIS — N201 Calculus of ureter: Secondary | ICD-10-CM | POA: Insufficient documentation

## 2018-04-30 DIAGNOSIS — I10 Essential (primary) hypertension: Secondary | ICD-10-CM | POA: Insufficient documentation

## 2018-04-30 DIAGNOSIS — I2510 Atherosclerotic heart disease of native coronary artery without angina pectoris: Secondary | ICD-10-CM | POA: Diagnosis not present

## 2018-04-30 DIAGNOSIS — Z79890 Hormone replacement therapy (postmenopausal): Secondary | ICD-10-CM | POA: Insufficient documentation

## 2018-04-30 DIAGNOSIS — Z79899 Other long term (current) drug therapy: Secondary | ICD-10-CM | POA: Diagnosis not present

## 2018-04-30 DIAGNOSIS — Z7984 Long term (current) use of oral hypoglycemic drugs: Secondary | ICD-10-CM | POA: Diagnosis not present

## 2018-04-30 DIAGNOSIS — I252 Old myocardial infarction: Secondary | ICD-10-CM | POA: Insufficient documentation

## 2018-04-30 DIAGNOSIS — E119 Type 2 diabetes mellitus without complications: Secondary | ICD-10-CM | POA: Diagnosis not present

## 2018-04-30 DIAGNOSIS — Z7982 Long term (current) use of aspirin: Secondary | ICD-10-CM | POA: Diagnosis not present

## 2018-04-30 DIAGNOSIS — E890 Postprocedural hypothyroidism: Secondary | ICD-10-CM | POA: Insufficient documentation

## 2018-04-30 HISTORY — PX: CYSTOSCOPY/RETROGRADE/URETEROSCOPY/STONE EXTRACTION WITH BASKET: SHX5317

## 2018-04-30 LAB — GLUCOSE, CAPILLARY
Glucose-Capillary: 102 mg/dL — ABNORMAL HIGH (ref 70–99)
Glucose-Capillary: 119 mg/dL — ABNORMAL HIGH (ref 70–99)

## 2018-04-30 LAB — PSA: PSA: 1.99

## 2018-04-30 LAB — BASIC METABOLIC PANEL: Creatinine: 2 — AB (ref <=1.3)

## 2018-04-30 SURGERY — CYSTOSCOPY, WITH CALCULUS REMOVAL USING BASKET
Anesthesia: General | Site: Ureter | Laterality: Right

## 2018-04-30 MED ORDER — ONDANSETRON HCL 4 MG/2ML IJ SOLN
INTRAMUSCULAR | Status: DC | PRN
  Administered 2018-04-30: 4 mg via INTRAVENOUS

## 2018-04-30 MED ORDER — DEXAMETHASONE SODIUM PHOSPHATE 10 MG/ML IJ SOLN
INTRAMUSCULAR | Status: AC
  Filled 2018-04-30: qty 1

## 2018-04-30 MED ORDER — PROPOFOL 10 MG/ML IV BOLUS
INTRAVENOUS | Status: DC | PRN
  Administered 2018-04-30: 200 mg via INTRAVENOUS

## 2018-04-30 MED ORDER — MIDAZOLAM HCL 2 MG/2ML IJ SOLN
INTRAMUSCULAR | Status: AC
  Filled 2018-04-30: qty 2

## 2018-04-30 MED ORDER — ONDANSETRON HCL 4 MG/2ML IJ SOLN
INTRAMUSCULAR | Status: AC
  Filled 2018-04-30: qty 2

## 2018-04-30 MED ORDER — FENTANYL CITRATE (PF) 100 MCG/2ML IJ SOLN: 25.0000 ug | INTRAMUSCULAR | Status: DC | PRN

## 2018-04-30 MED ORDER — ONDANSETRON HCL 4 MG/2ML IJ SOLN: 4.0000 mg | Freq: Once | INTRAMUSCULAR | Status: DC | PRN

## 2018-04-30 MED ORDER — SULFAMETHOXAZOLE-TRIMETHOPRIM 800-160 MG PO TABS: 1.0000 | ORAL_TABLET | Freq: Two times a day (BID) | ORAL | 0 refills | Status: DC

## 2018-04-30 MED ORDER — FENTANYL CITRATE (PF) 100 MCG/2ML IJ SOLN
INTRAMUSCULAR | Status: AC
  Filled 2018-04-30: qty 2

## 2018-04-30 MED ORDER — 0.9 % SODIUM CHLORIDE (POUR BTL) OPTIME
TOPICAL | Status: DC | PRN
  Administered 2018-04-30: 1000 mL

## 2018-04-30 MED ORDER — SODIUM CHLORIDE 0.9 % IR SOLN
Status: DC | PRN
  Administered 2018-04-30: 3000 mL

## 2018-04-30 MED ORDER — PROPOFOL 10 MG/ML IV BOLUS
INTRAVENOUS | Status: AC
  Filled 2018-04-30: qty 40

## 2018-04-30 MED ORDER — FENTANYL CITRATE (PF) 100 MCG/2ML IJ SOLN
INTRAMUSCULAR | Status: DC | PRN
  Administered 2018-04-30: 25 ug via INTRAVENOUS
  Administered 2018-04-30: 50 ug via INTRAVENOUS
  Administered 2018-04-30: 25 ug via INTRAVENOUS

## 2018-04-30 MED ORDER — LACTATED RINGERS IV SOLN
INTRAVENOUS | Status: DC
  Administered 2018-04-30: 11:00:00 via INTRAVENOUS

## 2018-04-30 MED ORDER — CEFAZOLIN SODIUM-DEXTROSE 2-4 GM/100ML-% IV SOLN
2.0000 g | INTRAVENOUS | Status: AC
  Administered 2018-04-30: 2 g via INTRAVENOUS
  Filled 2018-04-30: qty 100

## 2018-04-30 MED ORDER — LIDOCAINE 2% (20 MG/ML) 5 ML SYRINGE
INTRAMUSCULAR | Status: DC | PRN
  Administered 2018-04-30: 75 mg via INTRAVENOUS

## 2018-04-30 MED ORDER — MIDAZOLAM HCL 5 MG/5ML IJ SOLN
INTRAMUSCULAR | Status: DC | PRN
  Administered 2018-04-30: 2 mg via INTRAVENOUS

## 2018-04-30 MED ORDER — IOHEXOL 300 MG/ML  SOLN
INTRAMUSCULAR | Status: DC | PRN
  Administered 2018-04-30: 10 mL via URETHRAL

## 2018-04-30 MED ORDER — DEXAMETHASONE SODIUM PHOSPHATE 10 MG/ML IJ SOLN
INTRAMUSCULAR | Status: DC | PRN
  Administered 2018-04-30: 5 mg via INTRAVENOUS

## 2018-04-30 SURGICAL SUPPLY — 28 items
BAG URO CATCHER STRL LF (MISCELLANEOUS) ×2 IMPLANT
BASKET LASER NITINOL 1.9FR (BASKET) IMPLANT
BASKET ZERO TIP NITINOL 2.4FR (BASKET) IMPLANT
BSKT STON RTRVL 120 1.9FR (BASKET)
BSKT STON RTRVL ZERO TP 2.4FR (BASKET)
CATH INTERMIT  6FR 70CM (CATHETERS) ×1 IMPLANT
CATH URET 5FR 28IN CONE TIP (BALLOONS)
CATH URET 5FR 70CM CONE TIP (BALLOONS) IMPLANT
CLOTH BEACON ORANGE TIMEOUT ST (SAFETY) ×2 IMPLANT
CONT SPEC 4OZ CLIKSEAL STRL BL (MISCELLANEOUS) ×1 IMPLANT
COVER WAND RF STERILE (DRAPES) IMPLANT
EXTRACTOR STONE 1.7FRX115CM (UROLOGICAL SUPPLIES) IMPLANT
FIBER LASER FLEXIVA 365 (UROLOGICAL SUPPLIES) IMPLANT
FIBER LASER TRAC TIP (UROLOGICAL SUPPLIES) IMPLANT
GLOVE BIO SURGEON STRL SZ7.5 (GLOVE) ×2 IMPLANT
GLOVE BIOGEL PI IND STRL 7.0 (GLOVE) IMPLANT
GLOVE BIOGEL PI INDICATOR 7.0 (GLOVE) ×1
GOWN SPEC L4 XLG W/TWL (GOWN DISPOSABLE) ×1 IMPLANT
GOWN STRL REUS W/TWL XL LVL3 (GOWN DISPOSABLE) ×2 IMPLANT
GUIDEWIRE ANG ZIPWIRE 038X150 (WIRE) IMPLANT
GUIDEWIRE STR DUAL SENSOR (WIRE) ×2 IMPLANT
KIT TURNOVER KIT A (KITS) IMPLANT
MANIFOLD NEPTUNE II (INSTRUMENTS) ×2 IMPLANT
PACK CYSTO (CUSTOM PROCEDURE TRAY) ×2 IMPLANT
SHEATH URETERAL 12FRX28CM (UROLOGICAL SUPPLIES) IMPLANT
SHEATH URETERAL 12FRX35CM (MISCELLANEOUS) IMPLANT
STENT URET 6FRX26 CONTOUR (STENTS) ×1 IMPLANT
TUBING UROLOGY SET (TUBING) ×2 IMPLANT

## 2018-04-30 NOTE — Transfer of Care (Signed)
Immediate Anesthesia Transfer of Care Note  Patient: Timothy Page  Procedure(s) Performed: CYSTOSCOPY/RETROGRADE/RIGHT URETEROSCOPY/ (Right Ureter)  Patient Location: PACU  Anesthesia Type:General  Level of Consciousness: awake, drowsy and patient cooperative  Airway & Oxygen Therapy: Patient Spontanous Breathing and Patient connected to face mask oxygen  Post-op Assessment: Report given to RN and Post -op Vital signs reviewed and stable  Post vital signs: Reviewed and stable  Last Vitals:  Vitals Value Taken Time  BP 133/72 04/30/2018  2:21 PM  Temp    Pulse 67 04/30/2018  2:22 PM  Resp 16 04/30/2018  2:22 PM  SpO2 100 % 04/30/2018  2:22 PM  Vitals shown include unvalidated device data.  Last Pain:  Vitals:   04/30/18 1125  TempSrc: Oral  PainSc:       Patients Stated Pain Goal: 3 (55/01/58 6825)  Complications: No apparent anesthesia complications

## 2018-04-30 NOTE — Anesthesia Procedure Notes (Signed)
Procedure Name: LMA Insertion Date/Time: 04/30/2018 1:47 PM Performed by: Lollie Sails, CRNA Pre-anesthesia Checklist: Patient identified, Emergency Drugs available, Suction available, Patient being monitored and Timeout performed Patient Re-evaluated:Patient Re-evaluated prior to induction Oxygen Delivery Method: Circle system utilized Preoxygenation: Pre-oxygenation with 100% oxygen Induction Type: IV induction Ventilation: Mask ventilation without difficulty LMA: LMA inserted LMA Size: 4.0 Number of attempts: 1 Placement Confirmation: positive ETCO2 and breath sounds checked- equal and bilateral Tube secured with: Tape Dental Injury: Teeth and Oropharynx as per pre-operative assessment

## 2018-04-30 NOTE — H&P (Signed)
CC/HPI: CC: Right ureteral calculus  HPI:  04/30/2018  63 year old male originally presented to the emergency department on 04/27/2018 with acute right sided flank pain. He was found to have a 2 mm distal right ureteral calculus. Creatinine was normal at that time. He subsequently went to the emergency department again last night. Creatinine was found to be 1.99 with mild leukocytosis of 14. Urinalysis was negative. He is having less pain at the moment. Denies any fever, chill, nausea, vomiting. Afebrile vital signs stable here.     ALLERGIES: Atenolol TABS Atorvastatin Codeine guaifenesin hydrOXYzine Mucinex Tetracyclines    MEDICATIONS: Aspirin 81 mg tablet,chewable  Levothyroxine Sodium  Metformin Hcl  Tamsulosin Hcl  Amlodipine Besilate  Cholecalciferol  Loratadine  Ondansetron Hcl  Oxycodone-Acetaminophen  Pravastatin Sodium  Ramipril  Senna-Docusate Sodium     GU PSH: None     PSH Notes: RK eye 1999   NON-GU PSH: Thyroidectomy - 04/29/2017         GU PMH: Renal calculus     NON-GU PMH: Anxiety Arthritis Diabetes Type 2 GERD Hypercholesterolemia Hypertension Hyperthyroidism Hypothyroidism Stroke/TIA     FAMILY HISTORY: 2 daughters - Daughter father deceased - Father mother deceased - Mother    SOCIAL HISTORY: Marital Status: Married Current Smoking Status: Patient has never smoked.  <DIV'  Tobacco Use Assessment Completed:  Used Tobacco in last 30 days?   Has never drank.  Drinks 2 caffeinated drinks per day. Patient's occupation Brewing technologist.     REVIEW OF SYSTEMS:      GU Review Male:  Patient reports get up at night to urinate. Patient denies frequent urination, hard to postpone urination, burning/ pain with urination, leakage of urine, stream starts and stops, trouble starting your stream, have to strain to urinate , erection problems, and penile pain.     Gastrointestinal (Upper):  Patient reports nausea and vomiting. Patient denies indigestion/  heartburn.     Gastrointestinal (Lower):  Patient reports constipation. Patient denies diarrhea.     Constitutional:  Patient reports fever. Patient denies night sweats, weight loss, and fatigue.     Skin:  Patient denies skin rash/ lesion and itching.     Eyes:  Patient denies blurred vision and double vision.     Ears/ Nose/ Throat:  Patient denies sore throat and sinus problems.     Hematologic/Lymphatic:  Patient denies swollen glands and easy bruising.     Cardiovascular:  Patient denies leg swelling and chest pains.     Respiratory:  Patient denies cough and shortness of breath.     Endocrine:  Patient denies excessive thirst.     Musculoskeletal:  Patient denies back pain and joint pain.     Neurological:  Patient denies headaches and dizziness.     Psychologic:  Patient denies depression and anxiety.     VITAL SIGNS:        04/30/2018 09:42 AM      Weight 230 lb / 104.33 kg      Height 71 in / 180.34 cm      BP 117/74 mmHg      Pulse 65 /min      Temperature 97.3 F / 36.2 C      BMI 32.1 kg/m      MULTI-SYSTEM PHYSICAL EXAMINATION:       Constitutional: Well-nourished. No physical deformities. Normally developed. Good grooming.      Respiratory: No labored breathing, no use of accessory muscles.       Cardiovascular: Normal  temperature, adequate perfusion of extremities      Skin: No paleness, no jaundice      Neurologic / Psychiatric: Oriented to time, oriented to place, oriented to person. No depression, no anxiety, no agitation.      Gastrointestinal: No mass, no tenderness, no rigidity, mildly obese abdomen. No CVA tenderness bilaterally      Eyes: Normal conjunctivae. Normal eyelids.      Musculoskeletal: Normal gait and station of head and neck.              PAST DATA REVIEWED:   Source Of History:  Patient  Lab Test Review:  BMP, CBC with Diff  Records Review:  Previous Patient Records  X-Ray Review: C.T. Abdomen/Pelvis: Reviewed Films. Reviewed Report. Discussed  With Patient.     PROCEDURES:    Urinalysis w/Scope  Dipstick Dipstick Cont'd Micro  Color: Amber Bilirubin: Neg mg/dL WBC/hpf: 0 - 5/hpf  Appearance: Cloudy Ketones: Trace mg/dL RBC/hpf: 10 - 20/hpf  Specific Gravity: 1.025 Blood: 3+ ery/uL Bacteria: NS (Not Seen)  pH: <=5.0 Protein: Trace mg/dL Cystals: Amorph Urates  Glucose: Neg mg/dL Urobilinogen: 0.2 mg/dL Casts: Hyaline   Nitrites: Neg Trichomonas: Not Present   Leukocyte Esterase: Trace leu/uL Mucous: Present    Epithelial Cells: 0 - 5/hpf    Yeast: NS (Not Seen)    Sperm: Not Present    ASSESSMENT:     ICD-10 Details  1 GU:  Ureteral calculus - G01.7   2  Renal colic - C94    PLAN:   Orders  Labs Urine Culture  Document  Letter(s):  Created for Patient: Clinical Summary   Notes:  We discussed the management of urinary stones. These options include observation, ureteroscopy, and shockwave lithotripsy. We discussed which options are relevant to these particular stones. We discussed the natural history of stones as well as the complications of untreated stones and the impact on quality of life without treatment as well as with each of the above listed treatments. We also discussed the efficacy of each treatment in its ability to clear the stone burden. With any of these management options I discussed the signs and symptoms of infection and the need for emergent treatment should these be experienced. For each option we discussed the ability of each procedure to clear the patient of their stone burden.   For observation I described the risks which include but are not limited to silent renal damage, life-threatening infection, need for emergent surgery, failure to pass stone, and pain.   For ureteroscopy I described the risks which include heart attack, stroke, pulmonary embolus, death, bleeding, infection, damage to contiguous structures, positioning injury, ureteral stricture, ureteral avulsion, ureteral injury, need for  ureteral stent, inability to perform ureteroscopy, need for an interval procedure, inability to clear stone burden, stent discomfort and pain.   For shockwave lithotripsy I believe the stone is pretty small to try to localize. I do not think this would be the best option compared to ureteroscopy.   He would like to proceed with ureteroscopy. We will do this today.   Cc: Shawnie Dapper, M.D.   Signed by Link Snuffer, III, M.D. on 04/30/18 at 10:23 AM (EDT

## 2018-04-30 NOTE — Telephone Encounter (Signed)
Noted  

## 2018-04-30 NOTE — Op Note (Signed)
Operative Note  Preoperative diagnosis:  1.  Right ureteral calculus  Post operative diagnosis: 1.  Right ureteral calculus  Procedure(s): 1.  Cystoscopy with right retrograde pyelogram and right ureteral stent placement  Surgeon: Link Snuffer, MD  Assistants: None  Anesthesia: General  Complications: None immediate  EBL: Minimal  Specimens: 1.  Urine culture  Drains/Catheters: 1.  6 X 26 double-J ureteral stent  Intraoperative findings: 1.  Normal urethra and bladder 2.  Upon insertion of the wire, there was a large amount of E flux of thick brown sludge and therefore decision was made not to perform ureteroscopy 3.  Right retrograde pyelogram revealed a filling defect at the level of the stone with upstream hydroureteronephrosis.  Indication: 63 year old male with a small right ureteral calculus with persistent pain and discomfort presents for the previously mentioned operation.  He also was noted to have acute renal insufficiency with a creatinine of 2.  Description of procedure:  The patient was identified and consent was obtained.  The patient was taken to the operating room and placed in the supine position.  The patient was placed under general anesthesia.  Perioperative antibiotics were administered.  The patient was placed in dorsal lithotomy.  Patient was prepped and draped in a standard sterile fashion and a timeout was performed.  A 21 French rigid cystoscope was advanced into the urethra and into the bladder.  A wire was advanced up the right ureter and into the kidney under fluoroscopic guidance.  There was immediate E flux of brown sludge.  Given the possibility of infection that was cleared, I decided not to perform ureteroscopy.  At this point the right distal most portion of the ureter was cannulated with an open-ended ureteral catheter.  Retrograde pyelogram was performed with the findings noted above.  A sensor wire was then advanced up to the kidney under  fluoroscopic guidance.  A 6 X 26 double-J ureteral stent was advanced up to the kidney under fluoroscopic guidance.  The wire was withdrawn and fluoroscopy confirmed good proximal placement and direct visualization confirmed a good coil within the bladder.  The bladder was drained and the scope withdrawn.  This concluded the operation.  Patient tolerated procedure well and was stable postoperatively.  Plan: Follow-up in 1 week for urine culture and scheduling of procedure

## 2018-04-30 NOTE — Anesthesia Preprocedure Evaluation (Signed)
Anesthesia Evaluation  Patient identified by MRN, date of birth, ID band Patient awake    Reviewed: Allergy & Precautions, NPO status , Patient's Chart, lab work & pertinent test results  Airway Mallampati: II  TM Distance: >3 FB Neck ROM: Full    Dental no notable dental hx. (+) Teeth Intact   Pulmonary neg pulmonary ROS,    Pulmonary exam normal breath sounds clear to auscultation       Cardiovascular hypertension, Pt. on medications + CAD and + Past MI  negative cardio ROS Normal cardiovascular exam Rhythm:Regular Rate:Normal     Neuro/Psych negative neurological ROS  negative psych ROS   GI/Hepatic negative GI ROS, Neg liver ROS,   Endo/Other  diabetes, Type 2, Insulin Dependent, Oral Hypoglycemic AgentsHypothyroidism   Renal/GU Renal InsufficiencyRenal disease  negative genitourinary   Musculoskeletal negative musculoskeletal ROS (+) Arthritis ,   Abdominal (+) + obese,   Peds negative pediatric ROS (+)  Hematology negative hematology ROS (+)   Anesthesia Other Findings   Reproductive/Obstetrics negative OB ROS                             Lab Results  Component Value Date   WBC 13.9 (H) 04/29/2018   HGB 13.8 04/29/2018   HCT 40.3 04/29/2018   MCV 90.2 04/29/2018   PLT 243 04/29/2018   Lab Results  Component Value Date   CREATININE 1.99 (H) 04/29/2018   BUN 16 04/29/2018   NA 133 (L) 04/29/2018   K 3.7 04/29/2018   CL 99 04/29/2018   CO2 22 04/29/2018    Anesthesia Physical  Anesthesia Plan  ASA: III  Anesthesia Plan: General   Post-op Pain Management:    Induction: Intravenous  PONV Risk Score and Plan: 4 or greater and Ondansetron, Treatment may vary due to age or medical condition and Dexamethasone  Airway Management Planned: LMA  Additional Equipment:   Intra-op Plan:   Post-operative Plan: Extubation in OR  Informed Consent: I have reviewed the  patients History and Physical, chart, labs and discussed the procedure including the risks, benefits and alternatives for the proposed anesthesia with the patient or authorized representative who has indicated his/her understanding and acceptance.     Dental advisory given  Plan Discussed with: CRNA  Anesthesia Plan Comments:         Anesthesia Quick Evaluation

## 2018-04-30 NOTE — Discharge Instructions (Signed)

## 2018-04-30 NOTE — Telephone Encounter (Signed)
Pt was seen, treated and d/c from ER on 05/01/18.

## 2018-04-30 NOTE — Telephone Encounter (Signed)
[place] Night - Client [facility] Patient Name: Timothy Page Gender: Male DOB: [birth year]  Age: 63 Y 51 M 25 D Return Phone Number: [phone] (Primary), [phone] (Secondary) Address:  City/State/Zip: [place]  [address] Client  Primary [facility] [place] Night - Client Client Site [facility] [facility] Physician Crissie Sickles - MD Contact Type Call Who Is Calling Patient / Member / Family / Caregiver Call Type Triage / Clinical Caller Name Tillmon Kisling Relationship To Patient Spouse Return Phone Number [phone] (Primary) Chief Complaint ABDOMINAL PAIN - Severe and only in abdomen Reason for Call Symptomatic / Request for [place] states her husband has been experiencing abdominal pain that has gotten worse. Translation No Nurse Assessment Nurse: Lovena Le, RN, Truman Hayward Date/Time Eilene Ghazi Time): 04/29/2018 3:54:17 PM Confirm and document reason for call. If symptomatic, describe symptoms. ---Caller states husband is on oxycodone and was in the Ed on Friday dx with kidney stone. Started with pain in eight back but now is in the lower right abdomen. Still in extreme pain. No having any BMs Has the patient traveled to [place], [place], [place], [place], or [place] OR had close contact with a person known to have the novel coronavirus illness in the last 14 days? ---No Does the patient have any new or worsening symptoms? ---Yes Will a triage be completed? ---Yes Related visit to physician within the last 2 weeks? ---Yes Does the PT have any chronic conditions? (i.e. diabetes, asthma, this includes High risk factors for pregnancy, etc.) ---Yes List chronic conditions. ---thyroid HTN Is this a behavioral health or substance abuse call? ---No Guidelines Guideline Title Affirmed Question Affirmed Notes Nurse Date/Time (Eastern Time) Abdominal Pain - Male [1] SEVERE pain (e.g.,  excruciating) AND [2] present > 1 hour  Margarita Sermons 04/29/2018 3:56:59 PM Disp. Time Eilene Ghazi Time) Disposition Final User 04/29/2018 3:52:20 PM Send to Urgent Paula Compton, [place] PLEASE NOTE:  All timestamps contained within this report are represented as [place] Standard Time. CONFIDENTIALTY NOTICE: This fax transmission is intended only for the addressee.  It contains information that is legally privileged, confidential or otherwise protected from use or disclosure.  If you are not the intended recipient, you are strictly prohibited from reviewing, disclosing, copying using or disseminating any of this information or taking any action in reliance on or regarding this information.  If you have received this fax in error, please notify us immediately by telephone so that we can arrange for its return to [place]. Phone:  [phone], Toll-Free:  [phone], Fax:  [phone] Page: 2 of 2 Call Id: [id number] 04/29/2018 3:59:33 PM Go to ED Now Yes Lovena Le, RN, Windell Hummingbird Disagree/Comply Comply Caller Understands Yes PreDisposition Go to ED Care Advice Given Per Guideline GO TO ED NOW: * You need to be seen in the Emergency Department. DRIVING: Another adult should drive. NOTHING BY MOUTH: Do not eat or drink anything for now. (Reason: condition may need surgery and general anesthesia.) BRING MEDICINES: * Please bring a list of your current medicines when you go to the Emergency Department (ER). * It is also a good idea to bring the pill bottles too. This will help the doctor to make certain you are taking the right medicines and the right dose. CARE ADVICE given

## 2018-05-01 ENCOUNTER — Encounter (HOSPITAL_COMMUNITY): Payer: Self-pay | Admitting: Urology

## 2018-05-01 LAB — URINE CULTURE: Culture: NO GROWTH

## 2018-05-01 LAB — GLUCOSE, CAPILLARY: Glucose-Capillary: 134 mg/dL — ABNORMAL HIGH (ref 70–99)

## 2018-05-01 NOTE — Anesthesia Postprocedure Evaluation (Signed)
Anesthesia Post Note  Patient: Timothy Page  Procedure(s) Performed: CYSTOSCOPY/RETROGRADE/RIGHT URETEROSCOPY/ (Right Ureter)     Patient location during evaluation: PACU Anesthesia Type: General Level of consciousness: awake and alert Pain management: pain level controlled Vital Signs Assessment: post-procedure vital signs reviewed and stable Respiratory status: spontaneous breathing, nonlabored ventilation, respiratory function stable and patient connected to nasal cannula oxygen Cardiovascular status: blood pressure returned to baseline and stable Postop Assessment: no apparent nausea or vomiting Anesthetic complications: no    Last Vitals:  Vitals:   04/30/18 1445 04/30/18 1520  BP: (!) 141/81 (!) 151/85  Pulse: 67 88  Resp: 16 18  Temp: 36.8 C 36.6 C  SpO2: 96% 95%    Last Pain:  Vitals:   04/30/18 1520  TempSrc: Oral  PainSc: 0-No pain                 Tiajuana Amass

## 2018-05-02 ENCOUNTER — Encounter: Payer: Self-pay | Admitting: Family Medicine

## 2018-05-08 ENCOUNTER — Other Ambulatory Visit: Payer: Self-pay

## 2018-05-08 ENCOUNTER — Other Ambulatory Visit: Payer: Self-pay | Admitting: Urology

## 2018-05-08 ENCOUNTER — Encounter (HOSPITAL_COMMUNITY): Payer: Self-pay | Admitting: *Deleted

## 2018-05-08 NOTE — Progress Notes (Signed)
At time of preop phone call when speaking with wife to complete history and instrucions wife denies that patient has any cough, fever, runny nose, sore throat , difficulty breathing or shortness of breath and neither patient or family member has not traveled out of state.

## 2018-05-08 NOTE — Progress Notes (Signed)
Anesthesia Chart Review   Case:  [id number] Date/Time:  05/09/18 0715   Procedure:  CYSTOSCOPY WITH RIGHT RETROGRADE RIGHT URETEROSCOPY POSSIBLE HOLMIUM LASER POSSIBLE RIGHT STENT PLACEMENT (Right )   Anesthesia type:  General   Pre-op diagnosis:  RIGHT URETERAL STONE   Location:  [facility] ROOM 03 / [facility]   Surgeon:  Lucas Mallow, MD      DISCUSSION: 63 yo never smoker with h/o postoperative hypothyroidism (s/p total thyroidectomy 2018), HTN, HLD, thoracic aortic aneurysm (4.1cm 07/2017 w/1 year follow up recommended), stroke, DM II, right ureteral stone scheduled for above procedure 05/09/18 with Dr. Link Snuffer.   Pt can proceed with planned procedure after evaluation DOS (same day workup). VS: There were no vitals taken for this visit.  PROVIDERS: Tammi Sou, MD is PCP last seen 01/26/18  Gilford Raid, MD is Cardiothoracic Surgery  LABS: Labs DOS, same day workup (all labs ordered are listed, but only abnormal results are displayed)  Labs Reviewed - No data to display   IMAGES:   EKG: 04/30/2018 Rate 61 bpm Sinus rhythm with occasional premature ventricular complexes Otherwise normal ECG Since previous tracing QT has shortened  CV:  Past Medical History:  Diagnosis Date  . Arthritis   . Atypical chest pain 04/2017   ACS ruled out 05/11/17  . CAP (community acquired pneumonia) 05/07/2017  . Diabetes mellitus without complication ([place])    type 2   . History of thyroidectomy, total 2018   Goiter.  [name] Hx of adenomatous polyp of colon 07/02/2009   As of 09/2016, pt is aware he is overdue for repeat colonoscopy---he knows to call [facility] to set this up.  . Hyperlipidemia    Intol of multiple statins--started zetia 2019 but having same side effects-->doing zetia holiday as of 08/2017.  [name] Hypertension   . Lipoma    back  . 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 Cammy Copa  . Right ureteral calculus 04/2018   Cystoscopy with right retrograde pyelogram and right ureteral stent placement  . Seasonal allergies   . Stroke ([place])    in 34V no complications   . Tachyarrhythmia 1980   age 21 X 2 over 82 month period.  No recurrence since that time.  . Thoracic aortic aneurysm [facility])    Incidentally discovered, 4.1 cm-->Dr. Cyndia Bent saw pt 07/2017 and plans f/u CT and o/v in 1 yr.  . Vocal cord paralysis 07/2016   Left vocal cord paralysis noted after thyroid surgery.    Past Surgical History:  Procedure Laterality Date  . CARDIAC CATHETERIZATION  1980  . COLONOSCOPY W/ POLYPECTOMY  07/02/2009   tubular adenoma (recall 5 yrs; pt aware he is overdue as of 09/2016 and knows to call [facility] to set this up.  . CYSTOSCOPY/RETROGRADE/URETEROSCOPY/STONE EXTRACTION WITH BASKET Right 04/30/2018   Procedure: CYSTOSCOPY/RETROGRADE/RIGHT URETEROSCOPY/;  Surgeon: Lucas Mallow, MD;  Location: [facility];  Service: Urology;  Laterality: Right;  . ESOPHAGOGASTRODUODENOSCOPY  07/02/2009   NORMAL  . EYE SURGERY    . finger amputaion     with reattachement  . LASIK    . THYROIDECTOMY N/A 08/03/2016   Procedure: Total THYROIDECTOMY;  Surgeon: [name], MD;  Location: [facility];  Service: ENT;  Laterality: N/A;  Total Thyroidectomy     MEDICATIONS: No current facility-administered medications for this encounter.    [name] amLODipine (NORVASC) 10 MG tablet  . aspirin 81 MG tablet  . blood glucose meter  kit and supplies  . cholecalciferol (VITAMIN D) 1000 units tablet  . glucose blood (ONE TOUCH ULTRA TEST) test strip  . Lancets (ONETOUCH DELICA PLUS [id number]) MISC  . levothyroxine (SYNTHROID, LEVOTHROID) 137 MCG tablet  . loratadine (CLARITIN) 10 MG tablet  . metFORMIN (GLUCOPHAGE) 500 MG tablet  . ondansetron (ZOFRAN ODT) 4 MG disintegrating tablet  . oxyCODONE-acetaminophen (PERCOCET) 5-325 MG tablet  . pravastatin (PRAVACHOL) 20 MG tablet  . ramipril (ALTACE) 10 MG capsule  . senna-docusate (SENOKOT-S) 8.6-50  MG tablet  . sulfamethoxazole-trimethoprim (BACTRIM DS,SEPTRA DS) 800-160 MG tablet  . tamsulosin (FLOMAX) 0.4 MG CAPS capsule    Maia Plan [facility] Pre-Surgical Testing [id number] 05/08/18 3:20 PM

## 2018-05-08 NOTE — Anesthesia Preprocedure Evaluation (Signed)
Anesthesia Evaluation  Patient identified by MRN, date of birth, ID band Patient awake    Reviewed: Allergy & Precautions, NPO status , Patient's Chart, lab work & pertinent test results  Airway Mallampati: II  TM Distance: >3 FB Neck ROM: Full    Dental no notable dental hx. (+) Teeth Intact   Pulmonary neg pulmonary ROS,    Pulmonary exam normal breath sounds clear to auscultation       Cardiovascular hypertension, Pt. on medications + CAD and + Past MI  negative cardio ROS Normal cardiovascular exam Rhythm:Regular Rate:Normal     Neuro/Psych negative neurological ROS  negative psych ROS   GI/Hepatic negative GI ROS, Neg liver ROS,   Endo/Other  diabetes, Type 2, Insulin Dependent, Oral Hypoglycemic AgentsHypothyroidism   Renal/GU Renal InsufficiencyRenal disease  negative genitourinary   Musculoskeletal negative musculoskeletal ROS (+) Arthritis ,   Abdominal (+) + obese,   Peds negative pediatric ROS (+)  Hematology negative hematology ROS (+)   Anesthesia Other Findings   Reproductive/Obstetrics negative OB ROS                             Lab Results  Component Value Date   WBC 13.9 (H) 04/29/2018   HGB 13.8 04/29/2018   HCT 40.3 04/29/2018   MCV 90.2 04/29/2018   PLT 243 04/29/2018   Lab Results  Component Value Date   CREATININE 1.99 (H) 04/29/2018   BUN 16 04/29/2018   NA 133 (L) 04/29/2018   K 3.7 04/29/2018   CL 99 04/29/2018   CO2 22 04/29/2018    Anesthesia Physical  Anesthesia Plan  ASA: III  Anesthesia Plan: General   Post-op Pain Management:    Induction: Intravenous  PONV Risk Score and Plan: 4 or greater and Ondansetron, Treatment may vary due to age or medical condition and Dexamethasone  Airway Management Planned: LMA  Additional Equipment:   Intra-op Plan:   Post-operative Plan: Extubation in OR  Informed Consent: I have reviewed the  patients History and Physical, chart, labs and discussed the procedure including the risks, benefits and alternatives for the proposed anesthesia with the patient or authorized representative who has indicated his/her understanding and acceptance.     Dental advisory given  Plan Discussed with: CRNA  Anesthesia Plan Comments: ( )        Anesthesia Quick Evaluation

## 2018-05-09 ENCOUNTER — Ambulatory Visit (HOSPITAL_COMMUNITY): Payer: 59

## 2018-05-09 ENCOUNTER — Encounter (HOSPITAL_COMMUNITY): Payer: Self-pay | Admitting: *Deleted

## 2018-05-09 ENCOUNTER — Ambulatory Visit (HOSPITAL_COMMUNITY): Payer: 59 | Admitting: Physician Assistant

## 2018-05-09 ENCOUNTER — Encounter (HOSPITAL_COMMUNITY)
Admission: RE | Disposition: A | Discharge status: Home / Self Care | Payer: Self-pay | Source: Home / Self Care | Attending: Urology

## 2018-05-09 ENCOUNTER — Ambulatory Visit (HOSPITAL_COMMUNITY)
Admission: RE | Admit: 2018-05-09 | Discharge: 2018-05-09 | Disposition: A | Discharge status: Home / Self Care | Payer: 59 | Attending: Urology | Admitting: Urology

## 2018-05-09 DIAGNOSIS — Z794 Long term (current) use of insulin: Secondary | ICD-10-CM | POA: Insufficient documentation

## 2018-05-09 DIAGNOSIS — N201 Calculus of ureter: Secondary | ICD-10-CM | POA: Diagnosis present

## 2018-05-09 DIAGNOSIS — Z8673 Personal history of transient ischemic attack (TIA), and cerebral infarction without residual deficits: Secondary | ICD-10-CM | POA: Insufficient documentation

## 2018-05-09 DIAGNOSIS — E119 Type 2 diabetes mellitus without complications: Secondary | ICD-10-CM | POA: Insufficient documentation

## 2018-05-09 DIAGNOSIS — I10 Essential (primary) hypertension: Secondary | ICD-10-CM | POA: Diagnosis not present

## 2018-05-09 DIAGNOSIS — E890 Postprocedural hypothyroidism: Secondary | ICD-10-CM | POA: Diagnosis not present

## 2018-05-09 DIAGNOSIS — Z7982 Long term (current) use of aspirin: Secondary | ICD-10-CM | POA: Diagnosis not present

## 2018-05-09 DIAGNOSIS — I252 Old myocardial infarction: Secondary | ICD-10-CM | POA: Diagnosis not present

## 2018-05-09 DIAGNOSIS — F419 Anxiety disorder, unspecified: Secondary | ICD-10-CM | POA: Diagnosis not present

## 2018-05-09 DIAGNOSIS — M1990 Unspecified osteoarthritis, unspecified site: Secondary | ICD-10-CM | POA: Insufficient documentation

## 2018-05-09 DIAGNOSIS — Z79890 Hormone replacement therapy (postmenopausal): Secondary | ICD-10-CM | POA: Diagnosis not present

## 2018-05-09 DIAGNOSIS — I2510 Atherosclerotic heart disease of native coronary artery without angina pectoris: Secondary | ICD-10-CM | POA: Diagnosis not present

## 2018-05-09 DIAGNOSIS — N132 Hydronephrosis with renal and ureteral calculous obstruction: Secondary | ICD-10-CM | POA: Diagnosis not present

## 2018-05-09 DIAGNOSIS — Z79899 Other long term (current) drug therapy: Secondary | ICD-10-CM | POA: Insufficient documentation

## 2018-05-09 HISTORY — PX: CYSTOSCOPY WITH RETROGRADE PYELOGRAM, URETEROSCOPY AND STENT PLACEMENT: SHX5789

## 2018-05-09 HISTORY — DX: Cerebral infarction, unspecified: I63.9

## 2018-05-09 LAB — GLUCOSE, CAPILLARY: Glucose-Capillary: 134 mg/dL — ABNORMAL HIGH (ref 70–99)

## 2018-05-09 SURGERY — CYSTOURETEROSCOPY, WITH RETROGRADE PYELOGRAM AND STENT INSERTION
Anesthesia: General | Laterality: Right

## 2018-05-09 MED ORDER — MIDAZOLAM HCL 2 MG/2ML IJ SOLN
INTRAMUSCULAR | Status: AC
  Filled 2018-05-09: qty 2

## 2018-05-09 MED ORDER — CEFAZOLIN SODIUM-DEXTROSE 2-4 GM/100ML-% IV SOLN
2.0000 g | INTRAVENOUS | Status: AC
  Administered 2018-05-09: 2 g via INTRAVENOUS
  Filled 2018-05-09: qty 100

## 2018-05-09 MED ORDER — DEXAMETHASONE SODIUM PHOSPHATE 10 MG/ML IJ SOLN
INTRAMUSCULAR | Status: DC | PRN
  Administered 2018-05-09: 10 mg via INTRAVENOUS

## 2018-05-09 MED ORDER — ONDANSETRON HCL 4 MG/2ML IJ SOLN
INTRAMUSCULAR | Status: AC
  Filled 2018-05-09: qty 2

## 2018-05-09 MED ORDER — MEPERIDINE HCL 50 MG/ML IJ SOLN: 6.2500 mg | INTRAMUSCULAR | Status: DC | PRN

## 2018-05-09 MED ORDER — EPHEDRINE 5 MG/ML INJ
INTRAVENOUS | Status: AC
  Filled 2018-05-09: qty 10

## 2018-05-09 MED ORDER — EPHEDRINE SULFATE-NACL 50-0.9 MG/10ML-% IV SOSY
PREFILLED_SYRINGE | INTRAVENOUS | Status: DC | PRN
  Administered 2018-05-09 (×2): 5 mg via INTRAVENOUS

## 2018-05-09 MED ORDER — ONDANSETRON HCL 4 MG/2ML IJ SOLN: 4.0000 mg | Freq: Once | INTRAMUSCULAR | Status: DC | PRN

## 2018-05-09 MED ORDER — FENTANYL CITRATE (PF) 100 MCG/2ML IJ SOLN
INTRAMUSCULAR | Status: AC
  Filled 2018-05-09: qty 2

## 2018-05-09 MED ORDER — LIDOCAINE 2% (20 MG/ML) 5 ML SYRINGE
INTRAMUSCULAR | Status: DC | PRN
  Administered 2018-05-09: 50 mg via INTRAVENOUS

## 2018-05-09 MED ORDER — LACTATED RINGERS IV SOLN
INTRAVENOUS | Status: DC
  Administered 2018-05-09 (×3): via INTRAVENOUS

## 2018-05-09 MED ORDER — FENTANYL CITRATE (PF) 100 MCG/2ML IJ SOLN: 25.0000 ug | INTRAMUSCULAR | Status: DC | PRN

## 2018-05-09 MED ORDER — OXYCODONE HCL 5 MG PO TABS: 5.0000 mg | ORAL_TABLET | Freq: Once | ORAL | Status: DC | PRN

## 2018-05-09 MED ORDER — PROPOFOL 10 MG/ML IV BOLUS
INTRAVENOUS | Status: DC | PRN
  Administered 2018-05-09: 170 mg via INTRAVENOUS

## 2018-05-09 MED ORDER — LIDOCAINE 2% (20 MG/ML) 5 ML SYRINGE
INTRAMUSCULAR | Status: AC
  Filled 2018-05-09: qty 5

## 2018-05-09 MED ORDER — DEXAMETHASONE SODIUM PHOSPHATE 10 MG/ML IJ SOLN
INTRAMUSCULAR | Status: AC
  Filled 2018-05-09: qty 1

## 2018-05-09 MED ORDER — SODIUM CHLORIDE 0.9 % IR SOLN
Status: DC | PRN
  Administered 2018-05-09: 6000 mL via INTRAVESICAL

## 2018-05-09 MED ORDER — ACETAMINOPHEN 160 MG/5ML PO SOLN: 325.0000 mg | ORAL | Status: DC | PRN

## 2018-05-09 MED ORDER — FENTANYL CITRATE (PF) 100 MCG/2ML IJ SOLN
INTRAMUSCULAR | Status: DC | PRN
  Administered 2018-05-09 (×4): 25 ug via INTRAVENOUS

## 2018-05-09 MED ORDER — IOHEXOL 300 MG/ML  SOLN
INTRAMUSCULAR | Status: DC | PRN
  Administered 2018-05-09: 8 mL

## 2018-05-09 MED ORDER — OXYCODONE HCL 5 MG/5ML PO SOLN: 5.0000 mg | Freq: Once | ORAL | Status: DC | PRN

## 2018-05-09 MED ORDER — MIDAZOLAM HCL 5 MG/5ML IJ SOLN
INTRAMUSCULAR | Status: DC | PRN
  Administered 2018-05-09 (×2): 1 mg via INTRAVENOUS

## 2018-05-09 MED ORDER — ACETAMINOPHEN 325 MG PO TABS: 325.0000 mg | ORAL_TABLET | ORAL | Status: DC | PRN

## 2018-05-09 MED ORDER — PROPOFOL 10 MG/ML IV BOLUS
INTRAVENOUS | Status: AC
  Filled 2018-05-09: qty 20

## 2018-05-09 MED ORDER — HYDROCODONE-ACETAMINOPHEN 5-325 MG PO TABS: 1.0000 | ORAL_TABLET | ORAL | 0 refills | Status: DC | PRN

## 2018-05-09 SURGICAL SUPPLY — 24 items
BAG URO CATCHER STRL LF (MISCELLANEOUS) ×2 IMPLANT
BASKET LASER NITINOL 1.9FR (BASKET) IMPLANT
BASKET ZERO TIP NITINOL 2.4FR (BASKET) IMPLANT
BSKT STON RTRVL 120 1.9FR (BASKET)
BSKT STON RTRVL ZERO TP 2.4FR (BASKET)
CATH INTERMIT  6FR 70CM (CATHETERS) IMPLANT
CATH URET 5FR 28IN CONE TIP (BALLOONS)
CATH URET 5FR 70CM CONE TIP (BALLOONS) IMPLANT
CLOTH BEACON ORANGE TIMEOUT ST (SAFETY) ×2 IMPLANT
COVER WAND RF STERILE (DRAPES) IMPLANT
EXTRACTOR STONE 1.7FRX115CM (UROLOGICAL SUPPLIES) ×1 IMPLANT
FIBER LASER FLEXIVA 365 (UROLOGICAL SUPPLIES) IMPLANT
FIBER LASER TRAC TIP (UROLOGICAL SUPPLIES) IMPLANT
GLOVE BIO SURGEON STRL SZ7.5 (GLOVE) ×2 IMPLANT
GOWN STRL REUS W/TWL XL LVL3 (GOWN DISPOSABLE) ×2 IMPLANT
GUIDEWIRE ANG ZIPWIRE 038X150 (WIRE) IMPLANT
GUIDEWIRE STR DUAL SENSOR (WIRE) ×2 IMPLANT
KIT TURNOVER KIT A (KITS) IMPLANT
MANIFOLD NEPTUNE II (INSTRUMENTS) ×2 IMPLANT
PACK CYSTO (CUSTOM PROCEDURE TRAY) ×2 IMPLANT
SHEATH URETERAL 12FRX28CM (UROLOGICAL SUPPLIES) IMPLANT
SHEATH URETERAL 12FRX35CM (MISCELLANEOUS) IMPLANT
STENT URET 6FRX26 CONTOUR (STENTS) ×1 IMPLANT
TUBING UROLOGY SET (TUBING) ×2 IMPLANT

## 2018-05-09 NOTE — H&P (Signed)
CC/HPI: CC: Right ureteral calculus  HPI:  04/30/2018  63 year old male originally presented to the emergency department on 04/27/2018 with acute right sided flank pain. He was found to have a 2 mm distal right ureteral calculus. Creatinine was normal at that time. He subsequently went to the emergency department again last night. Creatinine was found to be 1.99 with mild leukocytosis of 14. Urinalysis was negative. He is having less pain at the moment. Denies any fever, chill, nausea, vomiting. Afebrile vital signs stable here.    05/04/18: Patient with above noted history. He underwent cystoscopy with right ureteral stent placement on 3/16. Upon wire insertion into the ureter, there was immediate efflux of brown sludge. Given the possibility of infection, URS was not performed. Retrograde did show filling defect at the level of the stone in the distal ureter with upstream hydronephrosis noted. Urine culture taken at the time of cystoscopy showed no growth. He remains on Bactrim. He returns today with persistent renal colic pain. He has been using Percocet every 4 hours, which helps minimally. He states that he will have improvement in pain for about 1-3 hours, then his pain will return. He describes the pain as severe at times. He describes it as spasm like pain. He also complains of dysuria, urgency, and frequency. He continues to have hematuria, but denies clots or difficulties voiding. He denies fevers or chills. He will have nausea with the pain, but denies vomiting. He was given Tamsulsoin on 3/13, but he is unsure if he is still using this.     ALLERGIES: Atenolol TABS Atorvastatin Codeine guaifenesin hydrOXYzine Mucinex Tetracyclines    MEDICATIONS: Aspirin 81 mg tablet,chewable  Levothyroxine Sodium  Metformin Hcl  Tamsulosin Hcl  Amlodipine Besilate  Cholecalciferol  Loratadine  Oxycodone-Acetaminophen  Pravastatin Sodium  Ramipril  Senna-Docusate Sodium     GU PSH: None      PSH Notes: RK eye 1999   NON-GU PSH: Thyroidectomy - 04/29/2017    GU PMH: Renal colic - 7/82/9562 Ureteral calculus - 04/30/2018 Renal calculus    NON-GU PMH: Anxiety Arthritis Diabetes Type 2 GERD Hypercholesterolemia Hypertension Hyperthyroidism Hypothyroidism Stroke/TIA    FAMILY HISTORY: 2 daughters - Daughter father deceased - Father mother deceased - Mother   SOCIAL HISTORY: Marital Status: Married Ethnicity: Not Hispanic Or Latino; Race: White Current Smoking Status: Patient has never smoked.   Tobacco Use Assessment Completed: Used Tobacco in last 30 days? Has never drank.  Drinks 2 caffeinated drinks per day. Patient's occupation Brewing technologist.    REVIEW OF SYSTEMS:    GU Review Male:   Patient reports frequent urination, hard to postpone urination, burning/ pain with urination, and get up at night to urinate. Patient denies leakage of urine, stream starts and stops, trouble starting your stream, have to strain to urinate , erection problems, and penile pain.  Gastrointestinal (Upper):   Patient denies nausea, vomiting, and indigestion/ heartburn.  Gastrointestinal (Lower):   Patient denies diarrhea and constipation.  Constitutional:   Patient reports night sweats. Patient denies fever, weight loss, and fatigue.  Skin:   Patient reports skin rash/ lesion. Patient denies itching.  Eyes:   Patient denies blurred vision and double vision.  Ears/ Nose/ Throat:   Patient denies sore throat and sinus problems.  Hematologic/Lymphatic:   Patient denies swollen glands and easy bruising.  Cardiovascular:   Patient denies leg swelling and chest pains.  Respiratory:   Patient denies cough and shortness of breath.  Endocrine:   Patient denies excessive  thirst.  Musculoskeletal:   Patient denies back pain and joint pain.  Neurological:   Patient denies headaches and dizziness.  Psychologic:   Patient denies depression and anxiety.   VITAL SIGNS:      05/04/2018 10:40 AM   Weight 230 lb / 104.33 kg  Height 71 in / 180.34 cm  BP 133/85 mmHg  Pulse 65 /min  Temperature 98.3 F / 36.8 C  BMI 32.1 kg/m   MULTI-SYSTEM PHYSICAL EXAMINATION:    Constitutional: Well-nourished. No physical deformities. Normally developed. Good grooming. Appears uncomfortable.   Respiratory: No labored breathing, no use of accessory muscles.   Cardiovascular: Normal temperature, normal extremity pulses, no swelling, no varicosities.  Skin: No paleness, no jaundice  Neurologic / Psychiatric: Oriented to time, oriented to place, oriented to person. No depression, no anxiety, no agitation.  Gastrointestinal: No mass, no tenderness, no rigidity, non obese abdomen. Right CVAT.   Musculoskeletal: Normal gait and station of head and neck.     PAST DATA REVIEWED:  Source Of History:  Patient  Lab Test Review:   BMP  Records Review:   Previous Patient Records  Urine Test Review:   Urinalysis, Urine Culture  X-Ray Review: C.T. Abdomen/Pelvis: Reviewed Films. Reviewed Report.     PROCEDURES:         KUB - [id number]  A single view of the abdomen is obtained. Right ureteral stent appears to be well positioned. No obvious calculus noted in the right distal ureter.       Patient confirmed No Neulasta OnPro Device.           Urinalysis w/Scope - 81001 Dipstick Dipstick Cont'd Micro  Color: Red Bilirubin: Invalid WBC/hpf: 0 - 5/hpf  Appearance: Turbid Ketones: Invalid RBC/hpf: Packed/hpf  Specific Gravity: Invalid Blood: Invalid Bacteria: Few (10-25/hpf)  pH: Invalid Protein: Invalid Cystals: Amorph Urates  Glucose: Invalid Urobilinogen: Invalid Casts: NS (Not Seen)    Nitrites: Invalid Trichomonas: Not Present    Leukocyte Esterase: Invalid Mucous: Not Present      Epithelial Cells: 0 - 5/hpf      Yeast: NS (Not Seen)      Sperm: Not Present    Notes:            Ketoralac 30mg  - [id number], [id number] The injection site was sterilely prepped with alcohol. Ketoralac was injected (IM)  using standard technique. The patient tolerated the procedure well. A band aid was applied. The site was dry when the patient left the exam room.   After treatment was administered, patient was observed to be symptom-free for 10-15 minutes.   Qty: 30 Adm. By: Audley Hose  Unit: mg Lot No [id number]  Route: IM Exp. Date 03/17/2019  Freq: None Mfgr.:   Site: Right Buttock   ASSESSMENT:      ICD-10 Details  1 GU:   Renal calculus - J19.1   2   Renal colic - Y78   3   Ureteral calculus - N20.1    PLAN:            Medications New Meds: Tamsulosin Hcl 0.4 mg capsule 1 capsule PO Daily   #30  0 Refill(s)  Uribel 118 mg-10 mg-40.8 mg-36 mg-0.12 mg capsule 1 capsule PO TID PRN   #20  0 Refill(s)    Stop Meds: Ondansetron Hcl  Discontinue: 05/04/2018  - Reason: The medication cycle was completed.            Orders Labs Urine Culture, BUN/Creatinine(Stat)  X-Rays: KUB  Schedule Procedure: 05/04/2018 at [facility] [phone] - Ketoralac 30mg  (Toradol Per 15 Mg) - [id number], [phone]          Document Letter(s):  Created for Patient: Clinical Summary         Notes:   I will repeat urine culture today, but he will remain on Bactrim. On KUB imaging, right ureteral stent appears to be well positioned. No obvious calculus noted in the right distal ureter. Given elevation of creatinine on 3/15, I am going to assess BUN/Creatinine today. Labs noted to be somewhat improved, with creatinine 1.8. Patient drove himself today and decline injection of Morphine. Case reviewed with on call urologist. Will proceed with 30 mg injection of Ketoralac today. Pain management strategies were reviewed. We discussed he may use limited amount of NSAIDs at home. We discussed use of heating pad. Rx for Tamsulosin and Uribel also sent to his pharmacy. I am hopeful these will help with spasm type pain. I will discuss follow up with his urologist. Strict return precuations to the clinic or ED were  given for fevers or progressive pain, nausea, or vomiting.           Next Appointment:      Next Appointment: 05/11/2018 08:30 AM    Appointment Type: 30 Minute Office Visit Established Pt    Location: [facility] [phone] [id number]    Provider: Salley Slaughter, P.A.    Reason for Visit: 1 week f/u     Signed by Azucena Fallen on 05/04/18 at 4:55 PM (EDT

## 2018-05-09 NOTE — Anesthesia Postprocedure Evaluation (Signed)
Anesthesia Post Note  Patient: Timothy Page  Procedure(s) Performed: CYSTOSCOPY WITH RIGHT RETROGRADE RIGHT URETEROSCOPY STONE EXTRACTION RIGHT STENT EXCHANGE (Right )     Patient location during evaluation: PACU Anesthesia Type: General Level of consciousness: awake and alert Pain management: pain level controlled Vital Signs Assessment: post-procedure vital signs reviewed and stable Respiratory status: spontaneous breathing, nonlabored ventilation, respiratory function stable and patient connected to nasal cannula oxygen Cardiovascular status: blood pressure returned to baseline and stable Postop Assessment: no apparent nausea or vomiting Anesthetic complications: no    Last Vitals:  Vitals:   05/09/18 0930 05/09/18 1000  BP: (!) 156/88 (!) 150/83  Pulse: (!) 58 64  Resp: 16 17  Temp: 36.5 C 36.6 C  SpO2: 99% 98%    Last Pain:  Vitals:   05/09/18 1000  TempSrc:   PainSc: 0-No pain                 Alean Kromer

## 2018-05-09 NOTE — Anesthesia Procedure Notes (Signed)
Procedure Name: LMA Insertion Date/Time: 05/09/2018 7:39 AM Performed by: Anne Fu, CRNA Pre-anesthesia Checklist: Patient identified, Emergency Drugs available, Suction available, Patient being monitored and Timeout performed Patient Re-evaluated:Patient Re-evaluated prior to induction Oxygen Delivery Method: Circle system utilized Preoxygenation: Pre-oxygenation with 100% oxygen Induction Type: IV induction Ventilation: Mask ventilation without difficulty LMA: LMA inserted LMA Size: 4.0 Number of attempts: 1 Placement Confirmation: positive ETCO2 and breath sounds checked- equal and bilateral Tube secured with: Tape

## 2018-05-09 NOTE — Discharge Instructions (Addendum)

## 2018-05-09 NOTE — Op Note (Signed)
Operative Note  Preoperative diagnosis:  1.  Right ureteral calculus  Postoperative diagnosis: 1.  Right ureteral calculus  Procedure(s): 1.  Cystoscopy, right retrograde pyelogram, right ureteroscopy with stone extraction, ureteral stent exchange  Surgeon: Link Snuffer, MD  Assistants: None  Anesthesia: General  Complications: None immediate  EBL: Minimal  Specimens: 1.  None  Drains/Catheters: 1.  6 x 26 double-J ureteral stent  Intraoperative findings: 1.  Normal urethra and bladder except for high riding bladder neck.  Small 2 mm distal right ureteral calculus with surrounding peri-ureteral inflammation.  Retrograde pyelogram revealed no obvious filling defect or hydronephrosis.  Indication: 63 year old male status post right ureteral stent placement for a distal right ureteral calculus.  He presents for definitive management.  Description of procedure:  The patient was identified and consent was obtained.  The patient was taken to the operating room and placed in the supine position.  The patient was placed under general anesthesia.  Perioperative antibiotics were administered.  The patient was placed in dorsal lithotomy.  Patient was prepped and draped in a standard sterile fashion and a timeout was performed.  A 21 French rigid cystoscope was advanced into the urethra and into the bladder.  Complete cystoscopy was performed with findings noted above.  The stent was grasped and pulled just beyond the meatus and a wire was advanced through the stent and up to the kidney under fluoroscopic guidance.  The stent was withdrawn.  A semirigid ureteroscope was advanced alongside the wire and the stone was encountered which was small.  Therefore basket extracted it.  I then inspected the entire ureter up to the renal pelvis and no other ureteral calculi were seen.  I shot a retrograde pyelogram through the scope with the findings noted above.  I withdrew the scope visualizing the  ureter upon removal and no ureteral calculi were seen.  I did note some inflammation around the area of stone impaction.  Therefore backloaded the wire onto a rigid cystoscope and advanced that into the bladder followed by routine placement of a 6 x 26 double-J ureteral stent in a standard fashion followed by removal of the wire.  Fluoroscopy confirmed proximal placement and direct visualization confirmed a good coil within the bladder.  I drained the bladder and withdrew the scope.  Patient tolerated procedure well and was stable postoperatively.  Plan: Return in 1 week for stent removal

## 2018-05-09 NOTE — Transfer of Care (Signed)
Immediate Anesthesia Transfer of Care Note  Patient: Timothy Page  Procedure(s) Performed: Procedure(s): CYSTOSCOPY WITH RIGHT RETROGRADE RIGHT URETEROSCOPY STONE EXTRACTION RIGHT STENT EXCHANGE (Right)  Patient Location: PACU  Anesthesia Type:General  Level of Consciousness:  sedated, patient cooperative and responds to stimulation  Airway & Oxygen Therapy:Patient Spontanous Breathing and Patient connected to face mask oxgen  Post-op Assessment:  Report given to PACU RN and Post -op Vital signs reviewed and stable  Post vital signs:  Reviewed and stable  Last Vitals:  Vitals:   05/09/18 0529  BP: 114/73  Pulse: (!) 55  Resp: 14  Temp: 36.7 C  SpO2: 33%    Complications: No apparent anesthesia complications

## 2018-05-10 ENCOUNTER — Encounter (HOSPITAL_COMMUNITY): Payer: Self-pay | Admitting: Urology

## 2018-05-11 ENCOUNTER — Telehealth: Payer: Self-pay | Admitting: Family Medicine

## 2018-05-11 NOTE — Telephone Encounter (Signed)
Copied from [place] [phone] [phone]. Topic: Quick Communication - See Telephone Encounter >> May 11, 2018 11:02 AM Rayann Heman wrote: CRM for notification. See Telephone encounter for: 05/11/18. Pt wife called and left a voicemail that stated that patient missed an appointment 04/27/18. Pt states that pt has been in the hospital twice and the hospital had blood work done. Pt wife would like to know if we can use this blood work instead of coming into the office. Patients wife would like a call back regarding. Please advise.

## 2018-05-11 NOTE — Telephone Encounter (Signed)
LMOM for pt to CB to schedule virtual visit as routine 3 month follow up.  Okay to leave detailed message per DPR.

## 2018-05-16 ENCOUNTER — Other Ambulatory Visit: Payer: Self-pay | Admitting: Surgery

## 2018-05-16 DIAGNOSIS — I712 Thoracic aortic aneurysm, without rupture, unspecified: Secondary | ICD-10-CM

## 2018-05-20 ENCOUNTER — Encounter: Payer: Self-pay | Admitting: Family Medicine

## 2018-05-31 ENCOUNTER — Encounter: Payer: Self-pay | Admitting: Family Medicine

## 2018-06-01 ENCOUNTER — Encounter: Payer: Self-pay | Admitting: Family Medicine

## 2018-06-01 ENCOUNTER — Ambulatory Visit (INDEPENDENT_AMBULATORY_CARE_PROVIDER_SITE_OTHER): Payer: 59 | Admitting: Family Medicine

## 2018-06-01 ENCOUNTER — Other Ambulatory Visit: Payer: Self-pay

## 2018-06-01 VITALS — BP 138/90 | HR 67

## 2018-06-01 DIAGNOSIS — E7800 Pure hypercholesterolemia, unspecified: Secondary | ICD-10-CM

## 2018-06-01 DIAGNOSIS — N179 Acute kidney failure, unspecified: Secondary | ICD-10-CM | POA: Diagnosis not present

## 2018-06-01 DIAGNOSIS — E119 Type 2 diabetes mellitus without complications: Secondary | ICD-10-CM | POA: Diagnosis not present

## 2018-06-01 DIAGNOSIS — I10 Essential (primary) hypertension: Secondary | ICD-10-CM

## 2018-06-01 DIAGNOSIS — N139 Obstructive and reflux uropathy, unspecified: Secondary | ICD-10-CM

## 2018-06-01 NOTE — Progress Notes (Signed)
Virtual Visit via Video Note  I connected with pt  on 06/01/18 at 11:00 AM EDT by a video enabled telemedicine application and verified that I am speaking with the correct person using two identifiers.  Location patient: home Location provider:work or home office Persons participating in the virtual visit: patient, provider  I discussed the limitations of evaluation and management by telemedicine and the availability of in person appointments. The patient expressed understanding and agreed to proceed.  Telemedicine visit is a necessity given the COVID-19 restrictions in place at the current time.  Patient Care Team    Relationship Specialty Notifications Start End  Tammi Sou, MD PCP - General Family Medicine  09/15/16   Gatha Mayer, MD Consulting Physician Gastroenterology  09/15/16   [name], MD Consulting Physician Otolaryngology  09/15/16   Elayne Snare, MD Consulting Physician Endocrinology  12/05/16   Gaye Pollack, MD Consulting Physician Cardiothoracic Surgery  08/10/17   Lucas Mallow, MD Consulting Physician Urology  05/20/18     HPI: 63 y/o WM being seen for 4 mo f/u DM 2, HTN, HLD. I last saw him 4 mo ago for his CPE, at which time all labs were stable->cholesterol still elevated.  DM: highest gluc 130 per pt.   HTN: historically well controlled.  Rare home bp check: <130/80.  Elevated sCr: 04/2018--> from obstructive uropathy caused by a ureteral stone. In review of his 05/18/18 urology f/u note, a repeat sCr was drawn that day but I don't have result at this time. Pt states he was told his kidney function was back to normal per urol on 05/18/2018.  HLD: statin and zetia-intolerant.  However, last visit we did end up trying again with low dose pravastatin. He took this for at least 30d.  It is unclear if he took it for the 2 months after that. Pt states the pravastatin gave him horrible headaches.  ROS: no CP, no SOB, no wheezing, no cough, no dizziness, no  HAs, no rashes, no melena/hematochezia.  No polyuria or polydipsia.  No myalgias or arthralgias.   Past Medical History:  Diagnosis Date  . Arthritis   . Atypical chest pain 04/2017   ACS ruled out 05/11/17  . CAP (community acquired pneumonia) 05/07/2017  . Diabetes mellitus without complication ([place])    type 2   . History of thyroidectomy, total 2018   Goiter.  [name] Hx of adenomatous polyp of colon 07/02/2009   As of 09/2016, pt is aware he is overdue for repeat colonoscopy---he knows to call [facility] to set this up.  . Hyperlipidemia    [place]ol of multiple statins--started zetia 2019 but having same side effects-->doing zetia holiday as of 08/2017.  [name] Hypertension   . Lipoma    back  . 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 Cammy Copa  . Right ureteral calculus 04/2018   Cystoscopy with right retrograde pyelogram and right ureteral stent placement after stone removal.  . Seasonal allergies   . Stroke ([place])    in 67T no complications   . Tachyarrhythmia 1980   age 51 X 2 over 20 month period.  No recurrence since that time.  . Thoracic aortic aneurysm [facility])    Incidentally discovered, 4.1 cm-->Dr. Cyndia Bent saw pt 07/2017 and plans f/u CT and o/v in 1 yr.  . Vocal cord paralysis 07/2016   Left vocal cord paralysis noted after thyroid surgery.    Past Surgical History:  Procedure Laterality  Date  . [place]  . COLONOSCOPY W/ POLYPECTOMY  07/02/2009   tubular adenoma (recall 5 yrs; pt aware he is overdue as of 09/2016 and knows to call [facility] to set this up.  . CYSTOSCOPY WITH RETROGRADE PYELOGRAM, URETEROSCOPY AND STENT PLACEMENT Right 05/09/2018   Procedure: CYSTOSCOPY WITH RIGHT RETROGRADE RIGHT URETEROSCOPY STONE EXTRACTION RIGHT STENT EXCHANGE;  Surgeon: Lucas Mallow, MD;  Location: [facility] ORS;  Service: Urology;  Laterality: Right;  . CYSTOSCOPY/RETROGRADE/URETEROSCOPY/STONE EXTRACTION WITH BASKET Right  04/30/2018   Procedure: CYSTOSCOPY/RETROGRADE/RIGHT URETEROSCOPY/;  Surgeon: Lucas Mallow, MD;  Location: [facility];  Service: Urology;  Laterality: Right;  . ESOPHAGOGASTRODUODENOSCOPY  07/02/2009   NORMAL  . EYE SURGERY    . finger amputaion     with reattachement  . LASIK    . THYROIDECTOMY N/A 08/03/2016   Procedure: Total THYROIDECTOMY;  Surgeon: [name], MD;  Location: [facility] OR;  Service: ENT;  Laterality: N/A;  Total Thyroidectomy     Family History  Problem Relation Age of Onset  . Diabetes Mother   . Cancer Mother        lung  . COPD Mother   . Brain cancer Mother   . Arthritis Mother   . Hyperlipidemia Mother   . Heart disease Mother   . Hypertension Mother   . Diabetes Father   . Cancer Father        mesothelioma; skin  . Mental illness Father   . Arthritis Father   . Hyperlipidemia Father   . Heart disease Father   . Hypertension Father   . Heart disease Maternal Grandmother        MI @ 73  . Heart disease Maternal Grandfather        MI @ 67  . Cancer Paternal Grandmother   . Diabetes Paternal Grandmother   . Heart disease Paternal Grandfather        MI @ 12  . Thyroid cancer Maternal Aunt       Current Outpatient Medications:  .  amLODipine (NORVASC) 10 MG tablet, TAKE 1 TABLET BY MOUTH  DAILY (Patient taking differently: Take 10 mg by mouth daily. ), Disp: 90 tablet, Rfl: 0 .  aspirin 81 MG tablet, Take 81 mg by mouth daily., Disp: , Rfl:  .  blood glucose meter kit and supplies, Use to check blood sugar once daily, Disp: 1 each, Rfl: 0 .  cholecalciferol (VITAMIN D) 1000 units tablet, Take 1,000 Units by mouth daily. , Disp: , Rfl:  .  glucose blood (ONE TOUCH ULTRA TEST) test strip, Use to check blood sugar daily, Disp: 100 each, Rfl: 3 .  Lancets (ONETOUCH DELICA PLUS TMLYYT03T) MISC, 1 each by Does not apply route daily., Disp: 100 each, Rfl: 3 .  levothyroxine (SYNTHROID, LEVOTHROID) 137 MCG tablet, TAKE 1 TABLET BY MOUTH  DAILY BEFORE  BREAKFAST (Patient taking differently: Take 137 mcg by mouth daily before breakfast. ), Disp: 90 tablet, Rfl: 0 .  loratadine (CLARITIN) 10 MG tablet, Take 10 mg by mouth daily as needed for allergies., Disp: , Rfl:  .  metFORMIN (GLUCOPHAGE) 500 MG tablet, TAKE 1 TABLET BY MOUTH TWO  TIMES DAILY WITH A MEAL (Patient taking differently: Take 500 mg by mouth 2 (two) times daily with a meal. ), Disp: 180 tablet, Rfl: 1 .  pravastatin (PRAVACHOL) 20 MG tablet, Take 1 tablet (20 mg total) by mouth daily., Disp: 30 tablet, Rfl: 2 .  ramipril (ALTACE) 10  MG capsule, TAKE 1 CAPSULE BY MOUTH TWO TIMES DAILY (Patient taking differently: Take 10 mg by mouth 2 (two) times daily. ), Disp: 180 capsule, Rfl: 1 .  senna-docusate (SENOKOT-S) 8.6-50 MG tablet, Take 1 tablet by mouth 2 (two) times daily., Disp: 60 tablet, Rfl: 0 .  tamsulosin (FLOMAX) 0.4 MG CAPS capsule, Take 1 capsule (0.4 mg total) by mouth daily., Disp: 10 capsule, Rfl: 0  EXAM:  VITALS per patient if applicable: BP 366/29 (BP Location: Left Arm, Patient Position: Sitting, Cuff Size: Normal)   Pulse 67    GENERAL: alert, oriented, appears well and in no acute distress  HEENT: atraumatic, conjunttiva clear, no obvious abnormalities on inspection of external nose and ears  NECK: normal movements of the head and neck  LUNGS: on inspection no signs of respiratory distress, breathing rate appears normal, no obvious gross SOB, gasping or wheezing  CV: no obvious cyanosis  MS: moves all visible extremities without noticeable abnormality  PSYCH/NEURO: pleasant and cooperative, no obvious depression or anxiety, speech and thought processing grossly intact  LABS: none today  Lab Results  Component Value Date   HGBA1C 6.5 01/26/2018     Chemistry      Component Value Date/Time   NA 133 (L) 04/29/2018 1650   K 3.7 04/29/2018 1650   CL 99 04/29/2018 1650   CO2 22 04/29/2018 1650   BUN 16 04/29/2018 1650   CREATININE 1.99 (H)  04/29/2018 1650   CREATININE 1.11 11/03/2017 1533      Component Value Date/Time   CALCIUM 9.1 04/29/2018 1650   ALKPHOS 83 04/29/2018 1650   AST 15 04/29/2018 1650   ALT 15 04/29/2018 1650   BILITOT 1.2 04/29/2018 1650     Lab Results  Component Value Date   TSH 1.12 01/26/2018   Lab Results  Component Value Date   CHOL 194 01/26/2018   HDL 38.00 (L) 01/26/2018   LDLCALC 124 (H) 01/26/2018   LDLDIRECT 123.0 04/21/2017   TRIG 160.0 (H) 01/26/2018   CHOLHDL 5 01/26/2018    ASSESSMENT AND PLAN:  Discussed the following assessment and plan:  1) DM 2: good control. HbA1c and urine microalb/cr ordered future. Lytes/cr future.  2) HTN: The current medical regimen is effective;  continue present plan and medications.  3) HLD: intolerant of multiple statins and zetia. Most recent trial-->pravastatin->severe HA's. He'll try to work hard on TLC. No further pravastatin. No new meds. FLP baseline today.  4) Ureteral stone w/obstructive uropathy: resolved s/p ureteroscopy with stone retrieval and stent insertion.  Urine clx's neg.  AKI resolving--pt states urology office told him that his kidney function 05/18/18 was back to normal. BMET future.  I discussed the assessment and treatment plan with the patient. The patient was provided an opportunity to ask questions and all were answered. The patient agreed with the plan and demonstrated an understanding of the instructions.   The patient was advised to call back or seek an in-person evaluation if the symptoms worsen or if the condition fails to improve as anticipated.  F/u: 3 mo RCI  Signed:  Crissie Sickles, MD           06/01/2018

## 2018-06-08 ENCOUNTER — Encounter: Payer: Self-pay | Admitting: Family Medicine

## 2018-06-08 ENCOUNTER — Other Ambulatory Visit (INDEPENDENT_AMBULATORY_CARE_PROVIDER_SITE_OTHER): Payer: 59

## 2018-06-08 ENCOUNTER — Other Ambulatory Visit: Payer: Self-pay

## 2018-06-08 DIAGNOSIS — N179 Acute kidney failure, unspecified: Secondary | ICD-10-CM | POA: Diagnosis not present

## 2018-06-08 DIAGNOSIS — E119 Type 2 diabetes mellitus without complications: Secondary | ICD-10-CM

## 2018-06-08 DIAGNOSIS — E7800 Pure hypercholesterolemia, unspecified: Secondary | ICD-10-CM

## 2018-06-08 LAB — HEMOGLOBIN A1C: Hgb A1c MFr Bld: 6.5 % (ref 4.6–6.5)

## 2018-06-08 LAB — BASIC METABOLIC PANEL
BUN: 19 mg/dL (ref 6–23)
CO2: 26 mEq/L (ref 19–32)
Calcium: 9.2 mg/dL (ref 8.4–10.5)
Chloride: 105 mEq/L (ref 96–112)
Creatinine, Ser: 1.18 mg/dL (ref 0.40–1.50)
GFR: 62.65 mL/min (ref >60.00)
Glucose, Bld: 146 mg/dL — ABNORMAL HIGH (ref 70–99)
Potassium: 4.5 mEq/L (ref 3.5–5.1)
Sodium: 141 mEq/L (ref 135–145)

## 2018-06-08 LAB — MICROALBUMIN / CREATININE URINE RATIO
Creatinine,U: 234.8 mg/dL
Microalb Creat Ratio: 0.6 mg/g (ref 0.0–30.0)
Microalb, Ur: 1.4 mg/dL (ref 0.0–1.9)

## 2018-06-08 LAB — LIPID PANEL
Cholesterol: 182 mg/dL (ref 0–200)
HDL: 36.3 mg/dL — ABNORMAL LOW (ref >39.00)
LDL Cholesterol: 125 mg/dL — ABNORMAL HIGH (ref 0–99)
NonHDL: 145.9
Total CHOL/HDL Ratio: 5
Triglycerides: 103 mg/dL (ref 0.0–149.0)
VLDL: 20.6 mg/dL (ref 0.0–40.0)

## 2018-06-11 ENCOUNTER — Telehealth: Payer: Self-pay

## 2018-06-11 ENCOUNTER — Other Ambulatory Visit: Payer: Self-pay | Admitting: Family Medicine

## 2018-06-11 NOTE — Telephone Encounter (Signed)
My Chart message sent

## 2018-06-14 ENCOUNTER — Other Ambulatory Visit: Payer: Self-pay | Admitting: Family Medicine

## 2018-06-14 ENCOUNTER — Telehealth: Payer: Self-pay

## 2018-06-14 NOTE — Telephone Encounter (Signed)
Copied from [place] [id number]. Topic: General - Inquiry >> Jun 13, 2018  4:15 PM Richardo Priest, [place] wrote: Reason for CRM: Patient's wife is on the line stating the pharmacy is going to fax over a request to fill his medication. Call back is [phone].

## 2018-06-15 ENCOUNTER — Other Ambulatory Visit: Payer: Self-pay | Admitting: Family Medicine

## 2018-06-15 NOTE — Telephone Encounter (Signed)
Copied from [place] [id number]. Topic: Quick Communication - Rx Refill/Question >> Jun 15, 2018 11:33 AM [name] wrote: Medication:  Clonidine  Pt's wife calling.  States that pharmacy has requested this medication for pt with no response.   Wife states that pt is completely out. Pt's wife can be reached at [phone].

## 2018-06-15 NOTE — Telephone Encounter (Signed)
Pt denied any fatigue associated with taking this medication. Provider verbally made aware. Will send in medication. Pt notified via [place].

## 2018-06-15 NOTE — Telephone Encounter (Signed)
Pls notify pt/wife that we got him off of clonidine back in the fall of 2019 b/c of fatigue associated with this med. He should not be taking it anymore.-thx

## 2018-06-15 NOTE — Telephone Encounter (Signed)
I don't see clonidine on patient's chart, I also don't see where we have received any requests from the pharmacy. Please advise.

## 2018-06-18 ENCOUNTER — Other Ambulatory Visit: Payer: Self-pay | Admitting: Family Medicine

## 2018-06-18 MED ORDER — CLONIDINE HCL 0.3 MG PO TABS: 0.3000 mg | ORAL_TABLET | Freq: Two times a day (BID) | ORAL | 1 refills | Status: DC

## 2018-06-18 NOTE — Telephone Encounter (Signed)
Please advise if we were sending refill of Clonidine? Pt denied feeling fatigued while taking.

## 2018-06-18 NOTE — Telephone Encounter (Signed)
Clonidine eRx'd.

## 2018-06-18 NOTE — Progress Notes (Signed)
Med RF encounter only (clonidine 0.3). Signed:  Crissie Sickles, MD           06/18/2018

## 2018-06-18 NOTE — Telephone Encounter (Signed)
MyChart message read from Friday.

## 2018-06-19 LAB — PSA: PSA: 1.68

## 2018-06-26 ENCOUNTER — Encounter: Payer: Self-pay | Admitting: Family Medicine

## 2018-06-26 NOTE — Progress Notes (Signed)
1.68 incorrect, result for another pt. His actual PSA was 1.99 on 04/30/18.

## 2018-07-20 ENCOUNTER — Other Ambulatory Visit: Payer: Self-pay | Admitting: Family Medicine

## 2018-08-01 ENCOUNTER — Other Ambulatory Visit: Payer: Self-pay

## 2018-08-01 ENCOUNTER — Telehealth: Payer: 59 | Admitting: Surgery

## 2018-08-04 ENCOUNTER — Other Ambulatory Visit: Payer: Self-pay | Admitting: Endocrinology

## 2018-08-17 ENCOUNTER — Emergency Department (HOSPITAL_BASED_OUTPATIENT_CLINIC_OR_DEPARTMENT_OTHER)
Admission: EM | Admit: 2018-08-17 | Discharge: 2018-08-17 | Disposition: A | Discharge status: Home / Self Care | Payer: 59 | Attending: Emergency Medicine | Admitting: Emergency Medicine

## 2018-08-17 ENCOUNTER — Encounter (HOSPITAL_BASED_OUTPATIENT_CLINIC_OR_DEPARTMENT_OTHER): Payer: Self-pay | Admitting: Emergency Medicine

## 2018-08-17 ENCOUNTER — Other Ambulatory Visit: Payer: Self-pay

## 2018-08-17 DIAGNOSIS — M542 Cervicalgia: Secondary | ICD-10-CM | POA: Diagnosis present

## 2018-08-17 DIAGNOSIS — M5481 Occipital neuralgia: Secondary | ICD-10-CM | POA: Insufficient documentation

## 2018-08-17 MED ORDER — BUPIVACAINE HCL 0.25 % IJ SOLN
20.0000 mL | Freq: Once | INTRAMUSCULAR | Status: AC
  Administered 2018-08-17: 20 mL
  Filled 2018-08-17: qty 1

## 2018-08-17 NOTE — Discharge Instructions (Addendum)
You were evaluated in the Emergency Department and after careful evaluation, we did not find any emergent condition requiring admission or further testing in the hospital.  Your symptoms today seem to be due to inflammation of the occipital nerve.  We attempted a nerve block here in the emergency department to take away the most of your pain.  We recommend continued anti-inflammatories at home.  1000 mg Tylenol every 4-6 hours as well as 400 mg ibuprofen every 4-6 hours for discomfort.  Please return to the Emergency Department if you experience any worsening of your condition.  We encourage you to follow up with a primary care provider.  Thank you for allowing [place] to be a part of your care.

## 2018-08-17 NOTE — ED Triage Notes (Signed)
Pt states he has neck and lower skull pain for 2 days.  Pt states pain is excruciating.

## 2018-08-17 NOTE — ED Provider Notes (Signed)
[facility] Emergency Department Provider Note MRN:  [id number]  Arrival date & time: 08/17/18     Chief Complaint   Neck Pain   History of Present Illness   Timothy Page is a 63 y.o. year-old male with a history of diabetes presenting to the ED with chief complaint of neck pain.  Patient explains that 2 days ago he woke up with a sore neck, and since then the pain has become more severe and localized to the left side of the neck where the neck meets the scalp.  Worse with palpation.  Denies trauma, no nausea or vomiting, no numbness or weakness to the arms or legs, no other complaints.  Review of Systems  A complete 10 system review of systems was obtained and all systems are negative except as noted in the HPI and PMH.   Patient's Health History    Past Medical History:  Diagnosis Date  . Arthritis   . Atypical chest pain 04/2017   ACS ruled out 05/11/17  . CAP (community acquired pneumonia) 05/07/2017  . Diabetes mellitus without complication ([place])    type 2   . History of thyroidectomy, total 2018   Goiter.  [name] Hx of adenomatous polyp of colon 07/02/2009   As of 09/2016, pt is aware he is overdue for repeat colonoscopy---he knows to call [facility] to set this up.  . Hyperlipidemia    Intol of multiple statins--started zetia 2019 but having same side effects-->doing zetia holiday as of 08/2017.  [name] Hypertension   . Lipoma    back  . 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 Cammy Copa  . Right ureteral calculus 04/2018   Cystoscopy with right retrograde pyelogram and right ureteral stent placement after stone removal.  . Seasonal allergies   . Stroke ([place])    in 95A no complications   . Tachyarrhythmia 1980   age 39 X 2 over 70 month period.  No recurrence since that time.  . Thoracic aortic aneurysm [facility])    Incidentally discovered, 4.1 cm-->Dr. Cyndia Bent saw pt 07/2017 and plans f/u CT and o/v in 1 yr.   . Vocal cord paralysis 07/2016   Left vocal cord paralysis noted after thyroid surgery.    Past Surgical History:  Procedure Laterality Date  . CARDIAC CATHETERIZATION  1980  . COLONOSCOPY W/ POLYPECTOMY  07/02/2009   tubular adenoma (recall 5 yrs; pt aware he is overdue as of 09/2016 and knows to call  GI to set this up.  . CYSTOSCOPY WITH RETROGRADE PYELOGRAM, URETEROSCOPY AND STENT PLACEMENT Right 05/09/2018   Procedure: CYSTOSCOPY WITH RIGHT RETROGRADE RIGHT URETEROSCOPY STONE EXTRACTION RIGHT STENT EXCHANGE;  Surgeon: Lucas Mallow, MD;  Location: [facility];  Service: Urology;  Laterality: Right;  . CYSTOSCOPY/RETROGRADE/URETEROSCOPY/STONE EXTRACTION WITH BASKET Right 04/30/2018   Procedure: CYSTOSCOPY/RETROGRADE/RIGHT URETEROSCOPY/;  Surgeon: Lucas Mallow, MD;  Location: [facility];  Service: Urology;  Laterality: Right;  . ESOPHAGOGASTRODUODENOSCOPY  07/02/2009   NORMAL  . EYE SURGERY    . finger amputaion     with reattachement  . LASIK    . THYROIDECTOMY N/A 08/03/2016   Procedure: Total THYROIDECTOMY;  Surgeon: [name], MD;  Location: [facility] OR;  Service: ENT;  Laterality: N/A;  Total Thyroidectomy     Family History  Problem Relation Age of Onset  . Diabetes Mother   . Cancer Mother        lung  . COPD Mother   .  Brain cancer Mother   . Arthritis Mother   . Hyperlipidemia Mother   . Heart disease Mother   . Hypertension Mother   . Diabetes Father   . Cancer Father        mesothelioma; skin  . Mental illness Father   . Arthritis Father   . Hyperlipidemia Father   . Heart disease Father   . Hypertension Father   . Heart disease Maternal Grandmother        MI @ 73  . Heart disease Maternal Grandfather        MI @ 25  . Cancer Paternal Grandmother   . Diabetes Paternal Grandmother   . Heart disease Paternal Grandfather        MI @ 15  . Thyroid cancer Maternal Aunt     Social History   Socioeconomic History  . Marital status: Married    Spouse  name: Not on file  . Number of children: Not on file  . Years of education: Not on file  . Highest education level: Not on file  Occupational History  . Not on file  Social Needs  . Financial resource strain: Not on file  . Food insecurity    Worry: Not on file    Inability: Not on file  . Transportation needs    Medical: Not on file    Non-medical: Not on file  Tobacco Use  . Smoking status: Never Smoker  . Smokeless tobacco: Never Used  Substance and Sexual Activity  . Alcohol use: No  . Drug use: No  . Sexual activity: Not on file  Lifestyle  . Physical activity    Days per week: Not on file    Minutes per session: Not on file  . Stress: Not on file  Relationships  . Social Herbalist on phone: Not on file    Gets together: Not on file    Attends religious service: Not on file    Active member of club or organization: Not on file    Attends meetings of clubs or organizations: Not on file    Relationship status: Not on file  . Intimate partner violence    Fear of current or ex partner: Not on file    Emotionally abused: Not on file    Physically abused: Not on file    Forced sexual activity: Not on file  Other Topics Concern  . Not on file  Social History Narrative   Married, 2 daughters, 1 grandson that lives with him.   Educ: some college   Occup: retired from the [employer].   No T/A/Ds.     Physical Exam  Vital Signs and Nursing Notes reviewed Vitals:   08/17/18 1116  BP: (!) 179/93  Pulse: 77  Resp: 18  Temp: 97.9 F (36.6 C)  SpO2: 99%    CONSTITUTIONAL: Well-appearing, NAD NEURO:  Alert and oriented x 3, normal and symmetric strength and sensation, normal coordination, normal speech EYES:  eyes equal and reactive ENT/NECK:  no LAD, no JVD CARDIO: Regular rate, well-perfused, normal S1 and S2 PULM:  CTAB no wheezing or rhonchi GI/GU:  normal bowel sounds, non-distended, non-tender MSK/SPINE:  No gross deformities, no edema; focal  tenderness palpation to the left occiput with reproducibility of patient's pain SKIN:  no rash, atraumatic PSYCH:  Appropriate speech and behavior  Diagnostic and Interventional Summary    Labs Reviewed - No data to display  No orders to display  Medications  bupivacaine (MARCAINE) 0.25 % (with pres) injection 20 mL (20 mLs Infiltration Given by Other 08/17/18 1205)     .Nerve Block  Date/Time: 08/17/2018 1:21 PM Performed by: Maudie Flakes, MD Authorized by: Maudie Flakes, MD   Consent:    Consent obtained:  Verbal   Consent given by:  Patient   Risks discussed:  Infection, nerve damage and pain   Alternatives discussed:  Alternative treatment Indications:    Indications:  Pain relief Location:    Body area:  Head   Head nerve:  Occipital   Laterality:  Left Pre-procedure details:    Skin preparation:  Alcohol   Preparation: Patient was prepped and draped in usual sterile fashion   Procedure details (see MAR for exact dosages):    Block needle gauge:  25 G   Anesthetic injected:  Bupivacaine 0.25% w/o epi   Steroid injected:  None   Additive injected:  None   Injection procedure:  Anatomic landmarks identified, incremental injection, negative aspiration for blood and anatomic landmarks palpated   Paresthesia:  None Post-procedure details:    Dressing:  None   Outcome:  Pain improved   Patient tolerance of procedure:  Tolerated well, no immediate complications   Critical Care  ED Course and Medical Decision Making  I have reviewed the triage vital signs and the nursing notes.  Pertinent labs & imaging results that were available during my care of the patient were reviewed by me and considered in my medical decision making (see below for details).  History and physical consistent with occipital neuralgia, patient has been taking some medications at home without relief, offered nerve block and was very interested, performed as described above.  Patient feeling  better and is appropriate for discharge with PCP follow-up.  After the discussed management above, the patient was determined to be safe for discharge.  The patient was in agreement with this plan and all questions regarding their care were answered.  ED return precautions were discussed and the patient will return to the ED with any significant worsening of condition.  Barth Kirks. Sedonia Small, MD [place] [email or web address]  Final Clinical Impressions(s) / ED Diagnoses     ICD-10-CM   1. Occipital neuralgia of left side  M54.81     ED Discharge Orders    None         Maudie Flakes, MD 08/17/18 1322

## 2018-08-23 ENCOUNTER — Other Ambulatory Visit: Payer: Self-pay | Admitting: Endocrinology

## 2018-08-23 DIAGNOSIS — E890 Postprocedural hypothyroidism: Secondary | ICD-10-CM

## 2018-08-24 ENCOUNTER — Other Ambulatory Visit: Payer: Self-pay

## 2018-08-24 ENCOUNTER — Ambulatory Visit
Admission: RE | Admit: 2018-08-24 | Discharge: 2018-08-24 | Disposition: A | Discharge status: Home / Self Care | Payer: 59 | Source: Ambulatory Visit | Attending: Surgery | Admitting: Surgery

## 2018-08-24 ENCOUNTER — Other Ambulatory Visit (INDEPENDENT_AMBULATORY_CARE_PROVIDER_SITE_OTHER): Payer: 59

## 2018-08-24 ENCOUNTER — Ambulatory Visit: Payer: Self-pay | Admitting: Endocrinology

## 2018-08-24 DIAGNOSIS — E890 Postprocedural hypothyroidism: Secondary | ICD-10-CM | POA: Diagnosis not present

## 2018-08-24 DIAGNOSIS — I712 Thoracic aortic aneurysm, without rupture, unspecified: Secondary | ICD-10-CM

## 2018-08-24 LAB — T4, FREE: Free T4: 0.95 ng/dL (ref 0.60–1.60)

## 2018-08-24 LAB — TSH: TSH: 3.06 u[IU]/mL (ref 0.35–4.50)

## 2018-08-24 MED ORDER — IOPAMIDOL (ISOVUE-370) INJECTION 76%
75.0000 mL | Freq: Once | INTRAVENOUS | Status: AC | PRN
  Administered 2018-08-24: 75 mL via INTRAVENOUS

## 2018-08-29 ENCOUNTER — Telehealth: Payer: 59 | Admitting: Surgery

## 2018-08-29 ENCOUNTER — Telehealth (INDEPENDENT_AMBULATORY_CARE_PROVIDER_SITE_OTHER): Payer: 59 | Admitting: Surgery

## 2018-08-29 ENCOUNTER — Other Ambulatory Visit: Payer: Self-pay

## 2018-08-29 DIAGNOSIS — I712 Thoracic aortic aneurysm, without rupture: Secondary | ICD-10-CM | POA: Diagnosis not present

## 2018-08-29 NOTE — Telephone Encounter (Signed)
[address]       [address],[place] [address]             [phone]     CARDIOTHORACIC SURGERY TELEPHONE VIRTUAL OFFICE NOTE  Referring Provider is McGowen, Adrian Blackwater, MD PCP is McGowen, Adrian Blackwater, MD   HPI:  I spoke with Timothy Page (DOB Oct 27, 1955 ) via telephone on 08/29/2018 at 5:12 PM and verified that I was speaking with the correct person using more than one form of identification.  We discussed the reason(s) for conducting our visit virtually instead of in-person.  The patient expressed understanding the circumstances and agreed to proceed as described.  Timothy Page is a 63 year old gentleman who I first saw on 08/02/2017 for evaluation of a 4.1 cm fusiform ascending aortic aneurysm.  He had a follow-up CT angios of the chest on 08/24/2018 to reevaluate this aneurysm.  He says that he has been doing well without chest pain or shortness of breath.  He has been checking his blood pressure frequently at home and it is under good control.   Current Outpatient Medications  Medication Sig Dispense Refill   amLODipine (NORVASC) 10 MG tablet Take 1 tablet (10 mg total) by mouth daily. 90 tablet 1   aspirin 81 MG tablet Take 81 mg by mouth daily.     blood glucose meter kit and supplies Use to check blood sugar once daily 1 each 0   cholecalciferol (VITAMIN D) 1000 units tablet Take 1,000 Units by mouth daily.      cloNIDine (CATAPRES) 0.3 MG tablet Take 1 tablet (0.3 mg total) by mouth 2 (two) times daily. 180 tablet 1   glucose blood (ONE TOUCH ULTRA TEST) test strip Use to check blood sugar daily 100 each 3   Lancets (ONETOUCH DELICA PLUS [id number]) MISC 1 each by Does not apply route daily. 100 each 3   levothyroxine (SYNTHROID) 137 MCG tablet Take 1 tablet (137 mcg total) by mouth daily before breakfast. 90 tablet 1   loratadine (CLARITIN) 10 MG tablet Take 10 mg by mouth daily as needed for allergies.     metFORMIN (GLUCOPHAGE) 500 MG tablet TAKE 1 TABLET  BY MOUTH TWO  TIMES DAILY WITH A MEAL 180 tablet 1   ramipril (ALTACE) 10 MG capsule TAKE 1 CAPSULE BY MOUTH TWO TIMES DAILY 180 capsule 1   senna-docusate (SENOKOT-S) 8.6-50 MG tablet Take 1 tablet by mouth 2 (two) times daily. 60 tablet 0   tamsulosin (FLOMAX) 0.4 MG CAPS capsule Take 1 capsule (0.4 mg total) by mouth daily. 10 capsule 0   No current facility-administered medications for this visit.      Diagnostic Tests:  CLINICAL DATA:  Ascending thoracic aortic prominence  EXAM: CT ANGIOGRAPHY CHEST WITH CONTRAST  TECHNIQUE: Multidetector CT imaging of the chest was performed using the standard protocol during bolus administration of intravenous contrast. Multiplanar CT image reconstructions and MIPs were obtained to evaluate the vascular anatomy.  CONTRAST:  44m ISOVUE-370 IOPAMIDOL (ISOVUE-370) INJECTION 76%  COMPARISON:  May 07, 2017  FINDINGS: Cardiovascular: Ascending thoracic aorta has a maximum transverse diameter of 4.1 x 4.0 cm, unchanged. There is no appreciable thoracic aortic dissection. Visualized great vessels appear normal. The measured diameter at the sinotubular junction measured on coronal images is 3.5 cm. The measured diameter at the aortic arch is 3.2 cm. The measured diameter of the descending aorta at the level of the main pulmonary outflow tract is 2.6 x 2.6 cm.  There are occasional foci of aortic atherosclerosis. There is mild coronary artery calcification. There is no pericardial effusion or pericardial thickening. There is no demonstrable pulmonary embolus.  Mediastinum/Nodes: Thyroid absent. There is a subcarinal lymph node measuring 1.1 x 1.0 cm. No other lymph node prominence is evident on this study. No esophageal lesions are evident.  Lungs/Pleura: There is slight bibasilar atelectasis. Previous areas of nodularity and airspace consolidation have cleared. There is currently no edema or consolidation. No pleural effusion or  pleural thickening evident.  Upper Abdomen: There is hepatic steatosis. Visualized upper abdominal structures otherwise appear unremarkable.  Musculoskeletal: There is degenerative change throughout the thoracic spine with diffuse idiopathic skeletal hyperostosis. There are no blastic or lytic bone lesions. No chest wall lesions evident.  Review of the MIP images confirms the above findings.  IMPRESSION: 1. Ascending thoracic aortic diameter measures 4.1 x 4.0 cm, stable. No thoracic aortic dissection. There is mild aortic atherosclerosis and scattered foci of coronary artery calcification. Recommend annual imaging followup by CTA or MRA. This recommendation follows 2010 ACCF/AHA/AATS/ACR/ASA/SCA/SCAI/SIR/STS/SVM Guidelines for the Diagnosis and Management of Patients with Thoracic Aortic Disease. Circulation. 2010; 121: Z735-A701. Aortic aneurysm NOS (ICD10-I71.9).  2.  No demonstrable pulmonary embolus.  3.  No edema or consolidation.  Slight bibasilar atelectasis.  4. Single mildly enlarged subcarinal lymph node of uncertain significance and etiology.  5.  Absent thyroid.  6.  Hepatic steatosis.  7.  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  Aortic Atherosclerosis (ICD10-I70.0).   Electronically Signed   By: Lowella Grip III M.D.   On: 08/24/2018 11:11    Impression:  He has a stable 4.1 cm fusiform ascending aortic aneurysm.  I reviewed the CT scan results with him.  This is still well below the 5.5 cm surgical threshold.  I have recommended a follow-up scan in 1 year.  I stressed the importance of continued good blood pressure control and preventing further enlargement and acute aortic dissection.  I advised him against doing any heavy lifting of more than 35 pounds.  Plan:  He will return to see me in 1 year with a CTA of the chest.     Gaye Pollack, MD 08/29/2018

## 2018-08-31 ENCOUNTER — Ambulatory Visit (INDEPENDENT_AMBULATORY_CARE_PROVIDER_SITE_OTHER): Payer: 59 | Admitting: Endocrinology

## 2018-08-31 ENCOUNTER — Ambulatory Visit (INDEPENDENT_AMBULATORY_CARE_PROVIDER_SITE_OTHER): Payer: 59 | Admitting: Family Medicine

## 2018-08-31 ENCOUNTER — Encounter: Payer: Self-pay | Admitting: Endocrinology

## 2018-08-31 ENCOUNTER — Other Ambulatory Visit: Payer: Self-pay

## 2018-08-31 ENCOUNTER — Encounter: Payer: Self-pay | Admitting: Family Medicine

## 2018-08-31 VITALS — BP 114/74 | HR 68 | Ht 69.0 in | Wt 230.4 lb

## 2018-08-31 VITALS — BP 112/71 | HR 69 | Temp 99.1°F | Resp 16 | Ht 69.0 in | Wt 230.8 lb

## 2018-08-31 DIAGNOSIS — R21 Rash and other nonspecific skin eruption: Secondary | ICD-10-CM

## 2018-08-31 DIAGNOSIS — R5383 Other fatigue: Secondary | ICD-10-CM | POA: Diagnosis not present

## 2018-08-31 DIAGNOSIS — I10 Essential (primary) hypertension: Secondary | ICD-10-CM | POA: Diagnosis not present

## 2018-08-31 DIAGNOSIS — L739 Follicular disorder, unspecified: Secondary | ICD-10-CM

## 2018-08-31 DIAGNOSIS — E669 Obesity, unspecified: Secondary | ICD-10-CM | POA: Diagnosis not present

## 2018-08-31 DIAGNOSIS — L729 Follicular cyst of the skin and subcutaneous tissue, unspecified: Secondary | ICD-10-CM

## 2018-08-31 DIAGNOSIS — E890 Postprocedural hypothyroidism: Secondary | ICD-10-CM

## 2018-08-31 DIAGNOSIS — M7711 Lateral epicondylitis, right elbow: Secondary | ICD-10-CM

## 2018-08-31 DIAGNOSIS — E119 Type 2 diabetes mellitus without complications: Secondary | ICD-10-CM | POA: Diagnosis not present

## 2018-08-31 LAB — BASIC METABOLIC PANEL
BUN: 24 mg/dL — ABNORMAL HIGH (ref 6–23)
CO2: 25 mEq/L (ref 19–32)
Calcium: 9.4 mg/dL (ref 8.4–10.5)
Chloride: 106 mEq/L (ref 96–112)
Creatinine, Ser: 1.15 mg/dL (ref 0.40–1.50)
GFR: 64.49 mL/min (ref >60.00)
Glucose, Bld: 158 mg/dL — ABNORMAL HIGH (ref 70–99)
Potassium: 4.4 mEq/L (ref 3.5–5.1)
Sodium: 139 mEq/L (ref 135–145)

## 2018-08-31 LAB — HEMOGLOBIN A1C: Hgb A1c MFr Bld: 6.9 % — ABNORMAL HIGH (ref 4.6–6.5)

## 2018-08-31 MED ORDER — MUPIROCIN 2 % EX OINT: 1.0000 "application " | TOPICAL_OINTMENT | Freq: Three times a day (TID) | CUTANEOUS | 1 refills | Status: DC

## 2018-08-31 NOTE — Progress Notes (Signed)
OFFICE VISIT  08/31/2018   CC:  Chief Complaint  Patient presents with  . Follow-up    RCI, pt is fasting   HPI:    Patient is a 63 y.o. Caucasian male who presents for 3 mo f/u HTN and DM 2.  Interim hx: pt presented to the ED 08/17/18 with pain in upper neck where it meets the scalp, dx'd with occipital neuralgia and was given a nerve block. Still has some pain in L occipital region "off and on".  Recent repeat CT for aortic aneurism f/u was stable, plan is repeat CT 1 yr.  DM 2: glucoses always 115-120.  HTN: normal home monitoring, no prob with meds.  Right lateral elbow pain the last few weeks, persistent, without trauma.  ? Overuse prior to onset.  No meds tried.  No tennis elbow band/strap tried.    Says for the last 10mohe has had reddish discoloration with little tiny bumps on the bulb.  Says it started BEFORE he had to wear mask all the time due to covid pandemic.  Says the little blisters pop and sometimes a bloody liquid comes out and the tiny blisters hurt.  Vaseline use no help.    ROS: no CP, no SOB, no wheezing, no cough, no dizziness, no melena/hematochezia.  No polyuria or polydipsia.     Past Medical History:  Diagnosis Date  . Arthritis   . Atypical chest pain 04/2017   ACS ruled out 05/11/17  . CAP (community acquired pneumonia) 05/07/2017  . [place]    type 2   . History of thyroidectomy, total 2018   Goiter.  .Marland KitchenHx of adenomatous polyp of colon 07/02/2009   As of 09/2016, pt is aware he is overdue for repeat colonoscopy---he knows to call [facility] to set this up.  . Hyperlipidemia    Intol of multiple statins--started zetia 2019 but having same side effects-->doing zetia holiday as of 08/2017.  .Marland KitchenHypertension   . Lipoma    back  . 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 KCammy Copa . Right ureteral calculus 04/2018   Cystoscopy with right retrograde pyelogram and  right ureteral stent placement after stone removal.  . Seasonal allergies   . [place]    in [place] complications   . Tachyarrhythmia 1980   age 89X 2 over 844 monthperiod.  No recurrence since that time.  . Thoracic aortic aneurysm ([facility]    Incidentally discovered, 4.1 cm-->Dr. BCyndia Bentsaw pt 07/2017 and plans f/u CT and o/v in 1 yr.  . Vocal cord paralysis 07/2016   Left vocal cord paralysis noted after thyroid surgery.    Past Surgical History:  Procedure Laterality Date  . CARDIAC CATHETERIZATION  1980  . COLONOSCOPY W/ POLYPECTOMY  07/02/2009   tubular adenoma (recall 5 yrs; pt aware he is overdue as of 09/2016 and knows to call [facility] to set this up.  . CYSTOSCOPY WITH RETROGRADE PYELOGRAM, URETEROSCOPY AND STENT PLACEMENT Right 05/09/2018   Procedure: CYSTOSCOPY WITH RIGHT RETROGRADE RIGHT URETEROSCOPY STONE EXTRACTION RIGHT STENT EXCHANGE;  Surgeon: BLucas Mallow MD;  [facility];  Service: Urology;  Laterality: Right;  . CYSTOSCOPY/RETROGRADE/URETEROSCOPY/STONE EXTRACTION WITH BASKET Right 04/30/2018   Procedure: CYSTOSCOPY/RETROGRADE/RIGHT URETEROSCOPY/;  Surgeon: BLucas Mallow MD;  [facility];  Service: Urology;  Laterality: Right;  . ESOPHAGOGASTRODUODENOSCOPY  07/02/2009   NORMAL  . EYE SURGERY    .  finger amputaion     with reattachement  . LASIK    . THYROIDECTOMY N/A 08/03/2016   Procedure: Total THYROIDECTOMY;  Surgeon: [name], MD;  Location: [facility];  Service: ENT;  Laterality: N/A;  Total Thyroidectomy     Outpatient Medications Prior to Visit  Medication Sig Dispense Refill  . amLODipine (NORVASC) 10 MG tablet Take 1 tablet (10 mg total) by mouth daily. 90 tablet 1  . aspirin 81 MG tablet Take 81 mg by mouth daily.    . blood glucose meter kit and supplies Use to check blood sugar once daily 1 each 0  . cholecalciferol (VITAMIN D) 1000 units tablet Take 1,000 Units by mouth daily.     . cloNIDine (CATAPRES) 0.3 MG tablet Take 1  tablet (0.3 mg total) by mouth 2 (two) times daily. (Patient taking differently: Take 0.3 mg by mouth 2 (two) times daily. Pt takes once daily.) 180 tablet 1  . glucose blood (ONE TOUCH ULTRA TEST) test strip Use to check blood sugar daily 100 each 3  . Lancets (ONETOUCH DELICA PLUS [id number]) MISC 1 each by Does not apply route daily. 100 each 3  . levothyroxine (SYNTHROID) 137 MCG tablet Take 1 tablet (137 mcg total) by mouth daily before breakfast. 90 tablet 1  . loratadine (CLARITIN) 10 MG tablet Take 10 mg by mouth daily as needed for allergies.    . metFORMIN (GLUCOPHAGE) 500 MG tablet TAKE 1 TABLET BY MOUTH TWO  TIMES DAILY WITH A MEAL 180 tablet 1  . ramipril (ALTACE) 10 MG capsule TAKE 1 CAPSULE BY MOUTH TWO TIMES DAILY 180 capsule 1  . senna-docusate (SENOKOT-S) 8.6-50 MG tablet Take 1 tablet by mouth 2 (two) times daily. 60 tablet 0  . tamsulosin (FLOMAX) 0.4 MG CAPS capsule Take 1 capsule (0.4 mg total) by mouth daily. (Patient not taking: Reported on 08/31/2018) 10 capsule 0   No facility-administered medications prior to visit.     Allergies  Allergen Reactions  . Atenolol     Severe "slowing"of functioning Severe "slowing"of functioning  . Hydroxyzine Other (See Comments)    "felt like I was coming out of my skin"  . Tetracycline     Rash Because of a history of documented adverse serious drug reaction;Medi Alert bracelet  is recommended Rash Because of a history of documented adverse serious drug reaction;Medi Alert braceletis recommended  . Atorvastatin     D/Ced by him due to Myalgias in Sept 2014 D/Ced by him due to Myalgias in Sept 2014  . Codeine     Hallucinations. "I see pink elephants"  . Guaifenesin     Other reaction(s): Other (See Comments) unknown unknown  . Mucinex [Guaifenesin Er] Other (See Comments)    Hallucinations "I see pink elephants"    ROS As per HPI  PE: Blood pressure 112/71, pulse 69, temperature 99.1 F (37.3 C), temperature  source Temporal, resp. rate 16, height _0  (1.753 m), weight 230 lb 12.8 oz (104.7 kg), SpO2 96 %. Body mass index is 34.08 kg/m.  Gen: Alert, well appearing.  Patient is oriented to person, place, time, and situation. AFFECT: pleasant, lucid thought and speech. Bulb of nose with fairly indistinct, tiny papules that are mildly erythematous.  I see no vesicles, no oozing or blood.  Nontender.   R elbow: TTPover lateral epicondyle extending a little down the extensor complex.  Pain with resisted supination.  Sensation and strength intact.  LABS:  Lab Results  Component Value Date  TSH 3.06 08/24/2018   Lab Results  Component Value Date   WBC 13.9 (H) 04/29/2018   HGB 13.8 04/29/2018   HCT 40.3 04/29/2018   MCV 90.2 04/29/2018   PLT 243 04/29/2018   Lab Results  Component Value Date   CREATININE 1.18 06/08/2018   BUN 19 06/08/2018   NA 141 06/08/2018   K 4.5 06/08/2018   CL 105 06/08/2018   CO2 26 06/08/2018   Lab Results  Component Value Date   ALT 15 04/29/2018   AST 15 04/29/2018   ALKPHOS 83 04/29/2018   BILITOT 1.2 04/29/2018   Lab Results  Component Value Date   CHOL 182 06/08/2018   Lab Results  Component Value Date   HDL 36.30 (L) 06/08/2018   Lab Results  Component Value Date   LDLCALC 125 (H) 06/08/2018   Lab Results  Component Value Date   TRIG 103.0 06/08/2018   Lab Results  Component Value Date   CHOLHDL 5 06/08/2018   Lab Results  Component Value Date   PSA 1.68 06/19/2018   PSA 1.99 04/30/2018   PSA 0.96 01/26/2018   Lab Results  Component Value Date   HGBA1C 6.5 06/08/2018    IMPRESSION AND PLAN:  1) DM 2, home glucose monitoring is good. Check A1c and BMET.  2) HTN: The current medical regimen is effective;  continue present plan and medications. BMET today.  3) Right elbow lateral epicondylitis: recommended tennis elbow strap/band. Instructed pt on correct use.    4) Rash on bulb of nose x 2 mo: question atypical  folliculitis. Will do trial of bactroban ointment tid x 10d.  Refer to derm if no better with this.  An After Visit Summary was printed and given to the patient.  FOLLOW UP: Return in about 6 months (around 03/03/2019) for annual CPE (fasting).  Signed:  Crissie Sickles, MD           08/31/2018

## 2018-08-31 NOTE — Progress Notes (Signed)
Patient ID: Timothy Page, male   DOB: 1955-06-18, 63 y.o.   MRN: [id number]            Reason for Appointment: Postsurgical hypothyroidism, follow-up    History of Present Illness:   Prior to surgery he probably had a history of goiter followed by his PCP In June 2018 patient noticed significant swelling of his neck along with fever and also had cold symptoms Since he started having difficulty breathing he went to the emergency room and was hospitalized Since he had tracheal compression he had thyroidectomy done by 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  Patient was discharged on 150 g of levothyroxine However since TSH was 0.1 this was reduced down to 137 g on his initial consultation in 7/18 Subsequently TSH has been normal  He was last seen in 08/2017  RECENT history:  He says that he feels tired and has to rest at the end of the afternoon but this is usually for short time and does not feel weak or fatigued the rest of the day.  No difficulty sleeping Previously taking clonidine twice a day but because of drowsiness he is taking that only at night No significant weight change  CHOKING symptoms: He says that similar to when he had his thyroid nodule he has had 3 episodes of feeling like his air has been cut off, usually in the late evenings and only once with lying down He says this is relieved by drinking a little water Otherwise no symptoms of difficulty swallowing Also has difficulty with dry mouth overnight with taking clonidine which he is now taking only once a day  Wt Readings from Last 3 Encounters:  08/31/18 230 lb 6.4 oz (104.5 kg)  08/31/18 230 lb 12.8 oz (104.7 kg)  08/17/18 230 lb (104.3 kg)   He is very regular with taking his levothyroxine before breakfast daily without any vitamins or calcium supplements at the same time  Lab Results  Component Value Date   TSH 3.06 08/24/2018   TSH 1.12 01/26/2018   TSH 0.79 08/14/2017   FREET4  0.95 08/24/2018   FREET4 1.08 08/14/2017   FREET4 1.24 11/11/2016     DIABETES history:  Patient has had mild diabetes since 2014 when his A1c was 6.5 and highest level of 6.8 in 02/2013 He has been on metformin 500 mg twice daily  Has been told by his previous PCP not to check his blood sugars  A1c stable as below:   Lab Results  Component Value Date   HGBA1C 6.5 06/08/2018   HGBA1C 6.5 01/26/2018   HGBA1C 6.6 (H) 09/01/2017   Lab Results  Component Value Date   MICROALBUR 1.4 06/08/2018   LDLCALC 125 (H) 06/08/2018   CREATININE 1.18 06/08/2018     Allergies as of 08/31/2018      Reactions   Atenolol    Severe "slowing"of functioning Severe "slowing"of functioning   Hydroxyzine Other (See Comments)   "felt like I was coming out of my skin"   Tetracycline    Rash Because of a history of documented adverse serious drug reaction;Medi Alert bracelet  is recommended Rash Because of a history of documented adverse serious drug reaction;Medi Alert braceletis recommended   Atorvastatin    D/Ced by him due to Myalgias in Sept 2014 D/Ced by him due to Myalgias in Sept 2014   Codeine    Hallucinations. "I see pink elephants"   Guaifenesin    Other reaction(s): Other (  See Comments) unknown unknown   Mucinex [guaifenesin Er] Other (See Comments)   Hallucinations "I see pink elephants"      Medication List       Accurate as of August 31, 2018 10:13 AM. If you have any questions, ask your nurse or doctor.        amLODipine 10 MG tablet Commonly known as: NORVASC Take 1 tablet (10 mg total) by mouth daily.   aspirin 81 MG tablet Take 81 mg by mouth daily.   blood glucose meter kit and supplies Use to check blood sugar once daily   cholecalciferol 1000 units tablet Commonly known as: VITAMIN D Take 1,000 Units by mouth daily.   cloNIDine 0.3 MG tablet Commonly known as: CATAPRES Take 1 tablet (0.3 mg total) by mouth 2 (two) times daily. What changed:    when to take this  additional instructions   glucose blood test strip Commonly known as: ONE TOUCH ULTRA TEST Use to check blood sugar daily   levothyroxine 137 MCG tablet Commonly known as: SYNTHROID Take 1 tablet (137 mcg total) by mouth daily before breakfast.   loratadine 10 MG tablet Commonly known as: CLARITIN Take 10 mg by mouth daily as needed for allergies.   metFORMIN 500 MG tablet Commonly known as: GLUCOPHAGE TAKE 1 TABLET BY MOUTH TWO  TIMES DAILY WITH A MEAL   mupirocin ointment 2 % Commonly known as: BACTROBAN Apply 1 application topically 3 (three) times daily. Started by: Tammi Sou, MD   OneTouch Delica Plus [id number] Misc 1 each by Does not apply route daily.   ramipril 10 MG capsule Commonly known as: ALTACE TAKE 1 CAPSULE BY MOUTH TWO TIMES DAILY   senna-docusate 8.6-50 MG tablet Commonly known as: Senokot-S Take 1 tablet by mouth 2 (two) times daily.   tamsulosin 0.4 MG Caps capsule Commonly known as: Flomax Take 1 capsule (0.4 mg total) by mouth daily.       Allergies:  Allergies  Allergen Reactions  . Atenolol     Severe "slowing"of functioning Severe "slowing"of functioning  . Hydroxyzine Other (See Comments)    "felt like I was coming out of my skin"  . Tetracycline     Rash Because of a history of documented adverse serious drug reaction;Medi Alert bracelet  is recommended Rash Because of a history of documented adverse serious drug reaction;Medi Alert braceletis recommended  . Atorvastatin     D/Ced by him due to Myalgias in Sept 2014 D/Ced by him due to Myalgias in Sept 2014  . Codeine     Hallucinations. "I see pink elephants"  . Guaifenesin     Other reaction(s): Other (See Comments) unknown unknown  . Mucinex [Guaifenesin Er] Other (See Comments)    Hallucinations "I see pink elephants"    Past Medical History:  Diagnosis Date  . Arthritis   . Atypical chest pain 04/2017   ACS ruled out 05/11/17  . CAP  (community acquired pneumonia) 05/07/2017  . Diabetes mellitus without complication ([place])    type 2   . History of thyroidectomy, total 2018   Goiter.  [name] Hx of adenomatous polyp of colon 07/02/2009   As of 09/2016, pt is aware he is overdue for repeat colonoscopy---he knows to call [facility] to set this up.  . Hyperlipidemia    Intol of multiple statins--started zetia 2019 but having same side effects-->doing zetia holiday as of 08/2017.  [name] Hypertension   . Lipoma    back  .  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 Cammy Copa  . Right ureteral calculus 04/2018   Cystoscopy with right retrograde pyelogram and right ureteral stent placement after stone removal.  . Seasonal allergies   . Stroke ([place])    in 35K no complications   . Tachyarrhythmia 1980   age 22 X 2 over 27 month period.  No recurrence since that time.  . Thoracic aortic aneurysm ([place])    Incidentally discovered, 4.1 cm-->Dr. Cyndia Bent. Rpt CT 08/2018 stable.  Plan rpt CT 1 yr.  . Vocal cord paralysis 07/2016   Left vocal cord paralysis noted after thyroid surgery.    Past Surgical History:  Procedure Laterality Date  . CARDIAC CATHETERIZATION  1980  . COLONOSCOPY W/ POLYPECTOMY  07/02/2009   tubular adenoma (recall 5 yrs; pt aware he is overdue as of 09/2016 and knows to call [facility] to set this up.  . CYSTOSCOPY WITH RETROGRADE PYELOGRAM, URETEROSCOPY AND STENT PLACEMENT Right 05/09/2018   Procedure: CYSTOSCOPY WITH RIGHT RETROGRADE RIGHT URETEROSCOPY STONE EXTRACTION RIGHT STENT EXCHANGE;  Surgeon: Lucas Mallow, MD;  Location: [facility];  Service: Urology;  Laterality: Right;  . CYSTOSCOPY/RETROGRADE/URETEROSCOPY/STONE EXTRACTION WITH BASKET Right 04/30/2018   Procedure: CYSTOSCOPY/RETROGRADE/RIGHT URETEROSCOPY/;  Surgeon: Lucas Mallow, MD;  Location: [facility];  Service: Urology;  Laterality: Right;  . ESOPHAGOGASTRODUODENOSCOPY  07/02/2009   NORMAL  . EYE SURGERY    .  finger amputaion     with reattachement  . LASIK    . THYROIDECTOMY N/A 08/03/2016   Procedure: Total THYROIDECTOMY;  Surgeon: [name], MD;  Location: [facility] OR;  Service: ENT;  Laterality: N/A;  Total Thyroidectomy     Family History  Problem Relation Age of Onset  . Diabetes Mother   . Cancer Mother        lung  . COPD Mother   . Brain cancer Mother   . Arthritis Mother   . Hyperlipidemia Mother   . Heart disease Mother   . Hypertension Mother   . Diabetes Father   . Cancer Father        mesothelioma; skin  . Mental illness Father   . Arthritis Father   . Hyperlipidemia Father   . Heart disease Father   . Hypertension Father   . Heart disease Maternal Grandmother        MI @ 41  . Heart disease Maternal Grandfather        MI @ 55  . Cancer Paternal Grandmother   . Diabetes Paternal Grandmother   . Heart disease Paternal Grandfather        MI @ 82  . Thyroid cancer Maternal Aunt     Social History:  reports that he has never smoked. He has never used smokeless tobacco. He reports that he does not drink alcohol or use drugs.    Review of Systems  He is on clonidine 0.3 mg hs Along with amlodipine and ramipril for hypertension, recently seen by PCP  BP Readings from Last 3 Encounters:  08/31/18 114/74  08/31/18 112/71  08/17/18 (!) 179/93      Examination:   BP 114/74 (BP Location: Left Arm, Patient Position: Sitting, Cuff Size: Normal)   Pulse 68   Ht '5\' 9"'  (1.753 m)   Wt 230 lb 6.4 oz (104.5 kg)   SpO2 98%   BMI 34.02 kg/m   Thyroid not palpable. 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, treated surgically in 07/2016  Patient is on levothyroxine supplementation with 137 g since about 09/2016 With this his TSH is consistently in the normal range, now 3.0 Although he has some symptoms of fatigue this is only at the end of the day relieved by rest and does not have any other  symptoms of hypothyroidism  He has been quite regular with taking his supplement  Episodes of choking: Discussed that this is unlikely to be related to his previous thyroid surgery His exam is quite normal today He is still taking large dose of clonidine at night and most of his symptoms in the evenings are probably related to dry mouth However he needs to discuss further with PCP  DIABETES: He will follow-up with PCP unless a consultation is requested here  He will follow-up in 6 months  There are no Patient Instructions on file for this visit.  Elayne Snare 08/31/2018

## 2018-09-03 ENCOUNTER — Other Ambulatory Visit: Payer: Self-pay | Admitting: Family Medicine

## 2018-09-07 ENCOUNTER — Encounter: Payer: Self-pay | Admitting: Family Medicine

## 2018-09-19 ENCOUNTER — Other Ambulatory Visit: Payer: Self-pay | Admitting: Family Medicine

## 2018-10-14 ENCOUNTER — Encounter: Payer: Self-pay | Admitting: Family Medicine

## 2018-11-28 ENCOUNTER — Other Ambulatory Visit: Payer: Self-pay | Admitting: Family Medicine

## 2018-12-01 ENCOUNTER — Other Ambulatory Visit: Payer: Self-pay | Admitting: Family Medicine

## 2019-01-02 ENCOUNTER — Other Ambulatory Visit: Payer: Self-pay | Admitting: Family Medicine

## 2019-01-22 ENCOUNTER — Other Ambulatory Visit: Payer: Self-pay

## 2019-01-22 MED ORDER — METFORMIN HCL 500 MG PO TABS: ORAL_TABLET | ORAL | 3 refills | Status: DC

## 2019-02-22 ENCOUNTER — Encounter: Payer: 59 | Admitting: Family Medicine

## 2019-03-01 ENCOUNTER — Other Ambulatory Visit (INDEPENDENT_AMBULATORY_CARE_PROVIDER_SITE_OTHER): Payer: 59

## 2019-03-01 ENCOUNTER — Other Ambulatory Visit: Payer: Self-pay

## 2019-03-01 DIAGNOSIS — E890 Postprocedural hypothyroidism: Secondary | ICD-10-CM | POA: Diagnosis not present

## 2019-03-01 LAB — TSH: TSH: 3.63 u[IU]/mL (ref 0.35–4.50)

## 2019-03-01 LAB — T4, FREE: Free T4: 1.2 ng/dL (ref 0.60–1.60)

## 2019-03-07 NOTE — Progress Notes (Signed)
Patient ID: Timothy Page, male   DOB: 08-Sep-1955, 64 y.o.   MRN: [id number]            Reason for Appointment: Postsurgical hypothyroidism, follow-up    History of Present Illness:   Prior to surgery he probably had a history of goiter followed by his PCP In June 2018 patient noticed significant swelling of his neck along with fever and also had cold symptoms Since he started having difficulty breathing he went to the emergency room and was hospitalized Since he had tracheal compression he had thyroidectomy done by 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  Patient was discharged on 150 g of levothyroxine However since TSH was 0.1 this was reduced down to 137 g on his initial consultation in 7/18  RECENT history:  He has not reported any fatigue compared to his last visit He feels fairly good all the time Has had chronic cold intolerance which he blames it on his blood pressure medicine Weight is the same  He is very regular with taking his levothyroxine before breakfast daily with water Not on any vitamins or calcium supplements at the same time  TSH is normal at 3.6  Wt Readings from Last 3 Encounters:  03/08/19 230 lb (104.3 kg)  08/31/18 230 lb 6.4 oz (104.5 kg)  08/31/18 230 lb 12.8 oz (104.7 kg)     Lab Results  Component Value Date   TSH 3.63 03/01/2019   TSH 3.06 08/24/2018   TSH 1.12 01/26/2018   FREET4 1.20 03/01/2019   FREET4 0.95 08/24/2018   FREET4 1.08 08/14/2017     DIABETES history:  Patient has had mild diabetes since 2014 when his A1c was 6.5 He has been on metformin 500 mg twice daily  Has been told by his previous PCP not to check his blood sugars  A1c last checked was relatively higher as below: He is due to see his PCP for follow-up next week  Lab Results  Component Value Date   HGBA1C 6.9 (H) 08/31/2018   HGBA1C 6.5 06/08/2018   HGBA1C 6.5 01/26/2018   Lab Results  Component Value Date   MICROALBUR 1.4  06/08/2018   [place] 125 (H) 06/08/2018   CREATININE 1.15 08/31/2018     Allergies as of 03/08/2019      Reactions   Atenolol    Severe "slowing"of functioning Severe "slowing"of functioning   Hydroxyzine Other (See Comments)   "felt like I was coming out of my skin"   Tetracycline    Rash Because of a history of documented adverse serious drug reaction;Medi Alert bracelet  is recommended Rash Because of a history of documented adverse serious drug reaction;Medi Alert braceletis recommended   Atorvastatin    D/Ced by him due to Myalgias in Sept 2014 D/Ced by him due to Myalgias in Sept 2014   Codeine    Hallucinations. "I see pink elephants"   Guaifenesin    Other reaction(s): Other (See Comments) unknown unknown   Mucinex [guaifenesin Er] Other (See Comments)   Hallucinations "I see pink elephants"      Medication List       Accurate as of March 08, 2019  8:06 AM. If you have any questions, ask your nurse or doctor.        amLODipine 10 MG tablet Commonly known as: NORVASC TAKE 1 TABLET BY MOUTH  DAILY   aspirin 81 MG tablet Take 81 mg by mouth daily.   blood glucose meter kit and  supplies Use to check blood sugar once daily   cholecalciferol 1000 units tablet Commonly known as: VITAMIN D Take 1,000 Units by mouth daily.   cloNIDine 0.3 MG tablet Commonly known as: CATAPRES Take 1 tablet (0.3 mg total) by mouth 2 (two) times daily. What changed:   when to take this  additional instructions   levothyroxine 137 MCG tablet Commonly known as: SYNTHROID Take 1 tablet (137 mcg total) by mouth daily before breakfast.   loratadine 10 MG tablet Commonly known as: CLARITIN Take 10 mg by mouth daily as needed for allergies.   metFORMIN 500 MG tablet Commonly known as: GLUCOPHAGE TAKE 1 TABLET BY MOUTH TWO  TIMES DAILY WITH MEALS   mupirocin ointment 2 % Commonly known as: BACTROBAN Apply 1 application topically 3 (three) times daily.   OneTouch  Delica Plus [id number] Misc USE TO CHECK BLOOD SUGAR  ONCE DAILY   OneTouch Ultra test strip Generic drug: glucose blood USE TO CHECK BLOOD SUGAR  ONCE DAILY   ramipril 10 MG capsule Commonly known as: ALTACE TAKE 1 CAPSULE BY MOUTH  TWICE DAILY   senna-docusate 8.6-50 MG tablet Commonly known as: Senokot-S Take 1 tablet by mouth 2 (two) times daily.   tamsulosin 0.4 MG Caps capsule Commonly known as: Flomax Take 1 capsule (0.4 mg total) by mouth daily.       Allergies:  Allergies  Allergen Reactions  . Atenolol     Severe "slowing"of functioning Severe "slowing"of functioning  . Hydroxyzine Other (See Comments)    "felt like I was coming out of my skin"  . Tetracycline     Rash Because of a history of documented adverse serious drug reaction;Medi Alert bracelet  is recommended Rash Because of a history of documented adverse serious drug reaction;Medi Alert braceletis recommended  . Atorvastatin     D/Ced by him due to Myalgias in Sept 2014 D/Ced by him due to Myalgias in Sept 2014  . Codeine     Hallucinations. "I see pink elephants"  . Guaifenesin     Other reaction(s): Other (See Comments) unknown unknown  . Mucinex [Guaifenesin Er] Other (See Comments)    Hallucinations "I see pink elephants"    Past Medical History:  Diagnosis Date  . Arthritis   . Atypical chest pain 04/2017   ACS ruled out 05/11/17  . CAP (community acquired pneumonia) 05/07/2017  . Diabetes mellitus without complication ([place])    type 2   . 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 Hx of adenomatous polyp of colon 07/02/2009   As of 09/2016, pt is aware he is overdue for repeat colonoscopy---he knows to call [facility] to set this up.  . Hyperlipidemia    Intol of multiple statins--started zetia 2019 but having same side effects-->doing zetia holiday as of 08/2017.  [name] Hypertension   . Lipoma    back  . 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 Cammy Copa  . Right ureteral calculus 04/2018   Cystoscopy with right retrograde pyelogram and right ureteral stent placement after stone removal.  . Seasonal allergies   . Stroke ([place])    in 82C no complications   . Tachyarrhythmia 1980   age 29 X 2 over 21 month period.  No recurrence since that time.  . Thoracic aortic aneurysm ([place])    Incidentally discovered, 4.1 cm-->Dr. Cyndia Bent. Rpt CT 08/2018 stable.  Plan rpt CT 1 yr.  . Vocal cord paralysis  07/2016   Left vocal cord paralysis noted after thyroid surgery.    Past Surgical History:  Procedure Laterality Date  . CARDIAC CATHETERIZATION  1980  . COLONOSCOPY W/ POLYPECTOMY  07/02/2009   tubular adenoma (recall 5 yrs; pt aware he is overdue as of 09/2016 and knows to call [facility] to set this up.  . CYSTOSCOPY WITH RETROGRADE PYELOGRAM, URETEROSCOPY AND STENT PLACEMENT Right 05/09/2018   Procedure: CYSTOSCOPY WITH RIGHT RETROGRADE RIGHT URETEROSCOPY STONE EXTRACTION RIGHT STENT EXCHANGE;  Surgeon: Lucas Mallow, MD;  Location: [facility];  Service: Urology;  Laterality: Right;  . CYSTOSCOPY/RETROGRADE/URETEROSCOPY/STONE EXTRACTION WITH BASKET Right 04/30/2018   Procedure: CYSTOSCOPY/RETROGRADE/RIGHT URETEROSCOPY/;  Surgeon: Lucas Mallow, MD;  Location: [facility];  Service: Urology;  Laterality: Right;  . ESOPHAGOGASTRODUODENOSCOPY  07/02/2009   NORMAL  . EYE SURGERY    . finger amputaion     with reattachement  . LASIK    . THYROIDECTOMY N/A 08/03/2016   Procedure: Total THYROIDECTOMY;  Surgeon: [name], MD;  Location: [facility] OR;  Service: ENT;  Laterality: N/A;  Total Thyroidectomy     Family History  Problem Relation Age of Onset  . Diabetes Mother   . Cancer Mother        lung  . COPD Mother   . Brain cancer Mother   . Arthritis Mother   . Hyperlipidemia Mother   . Heart disease Mother   . Hypertension Mother   . Diabetes Father   . Cancer Father        mesothelioma; skin  . Mental  illness Father   . Arthritis Father   . Hyperlipidemia Father   . Heart disease Father   . Hypertension Father   . Heart disease Maternal Grandmother        MI @ 34  . Heart disease Maternal Grandfather        MI @ 61  . Cancer Paternal Grandmother   . Diabetes Paternal Grandmother   . Heart disease Paternal Grandfather        MI @ 31  . Thyroid cancer Maternal Aunt     Social History:  reports that he has never smoked. He has never used smokeless tobacco. He reports that he does not drink alcohol or use drugs.    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, treated surgically in 07/2016  He is on generic levothyroxine supplementation with 137 g since about 09/2016 With this his TSH is consistently in the normal range, now 3.6 Does not complain of any fatigue now Has chronic cold intolerance He is very regular with his levothyroxine  He has been quite regular with taking his levothyroxine every morning  Episodes of choking: No recurrence  DIABETES: He will follow-up with PCP unless a consultation is requested here Discussed that his A1c had started going up and likely needs more  efforts to lose weight as well as possibly adjustment of the medications  He will follow-up in 12 months  There are no Patient Instructions on file for this visit.  Elayne Snare 03/08/2019

## 2019-03-08 ENCOUNTER — Encounter: Payer: Self-pay | Admitting: Endocrinology

## 2019-03-08 ENCOUNTER — Other Ambulatory Visit: Payer: Self-pay

## 2019-03-08 ENCOUNTER — Ambulatory Visit (INDEPENDENT_AMBULATORY_CARE_PROVIDER_SITE_OTHER): Payer: 59 | Admitting: Endocrinology

## 2019-03-08 VITALS — BP 120/70 | HR 76 | Ht 69.0 in | Wt 230.0 lb

## 2019-03-08 DIAGNOSIS — E890 Postprocedural hypothyroidism: Secondary | ICD-10-CM

## 2019-03-15 ENCOUNTER — Other Ambulatory Visit: Payer: Self-pay

## 2019-03-15 ENCOUNTER — Ambulatory Visit (INDEPENDENT_AMBULATORY_CARE_PROVIDER_SITE_OTHER): Payer: 59 | Admitting: Family Medicine

## 2019-03-15 ENCOUNTER — Encounter: Payer: Self-pay | Admitting: Family Medicine

## 2019-03-15 VITALS — BP 124/81 | HR 72 | Temp 98.2°F | Resp 16 | Ht 69.0 in | Wt 231.2 lb

## 2019-03-15 DIAGNOSIS — R252 Cramp and spasm: Secondary | ICD-10-CM

## 2019-03-15 DIAGNOSIS — Z23 Encounter for immunization: Secondary | ICD-10-CM

## 2019-03-15 DIAGNOSIS — I10 Essential (primary) hypertension: Secondary | ICD-10-CM | POA: Diagnosis not present

## 2019-03-15 DIAGNOSIS — Z860101 Personal history of adenomatous and serrated colon polyps: Secondary | ICD-10-CM

## 2019-03-15 DIAGNOSIS — E119 Type 2 diabetes mellitus without complications: Secondary | ICD-10-CM | POA: Diagnosis not present

## 2019-03-15 DIAGNOSIS — Z0000 Encounter for general adult medical examination without abnormal findings: Secondary | ICD-10-CM | POA: Diagnosis not present

## 2019-03-15 DIAGNOSIS — Z125 Encounter for screening for malignant neoplasm of prostate: Secondary | ICD-10-CM

## 2019-03-15 DIAGNOSIS — E7800 Pure hypercholesterolemia, unspecified: Secondary | ICD-10-CM

## 2019-03-15 DIAGNOSIS — Z86010 Personal history of colonic polyps: Secondary | ICD-10-CM

## 2019-03-15 DIAGNOSIS — E039 Hypothyroidism, unspecified: Secondary | ICD-10-CM

## 2019-03-15 LAB — HEMOGLOBIN A1C: Hgb A1c MFr Bld: 9.3 % — ABNORMAL HIGH (ref 4.6–6.5)

## 2019-03-15 LAB — COMPREHENSIVE METABOLIC PANEL
ALT: 12 U/L (ref 0–53)
AST: 10 U/L (ref 0–37)
Albumin: 4.3 g/dL (ref 3.5–5.2)
Alkaline Phosphatase: 88 U/L (ref 39–117)
BUN: 17 mg/dL (ref 6–23)
CO2: 26 mEq/L (ref 19–32)
Calcium: 9.3 mg/dL (ref 8.4–10.5)
Chloride: 103 mEq/L (ref 96–112)
Creatinine, Ser: 1.14 mg/dL (ref 0.40–1.50)
GFR: 65.03 mL/min (ref >60.00)
Glucose, Bld: 262 mg/dL — ABNORMAL HIGH (ref 70–99)
Potassium: 4.4 mEq/L (ref 3.5–5.1)
Sodium: 137 mEq/L (ref 135–145)
Total Bilirubin: 0.5 mg/dL (ref 0.2–1.2)
Total Protein: 6.5 g/dL (ref 6.0–8.3)

## 2019-03-15 LAB — CBC WITH DIFFERENTIAL/PLATELET
Basophils Absolute: 0.1 10*3/uL (ref 0.0–0.1)
Basophils Relative: 1 % (ref 0.0–3.0)
Eosinophils Absolute: 0.1 10*3/uL (ref 0.0–0.7)
Eosinophils Relative: 2.3 % (ref 0.0–5.0)
HCT: 40.7 % (ref 39.0–52.0)
Hemoglobin: 14 g/dL (ref 13.0–17.0)
Lymphocytes Relative: 24 % (ref 12.0–46.0)
Lymphs Abs: 1.5 10*3/uL (ref 0.7–4.0)
MCHC: 34.5 g/dL (ref 30.0–36.0)
MCV: 90.6 fl (ref 78.0–100.0)
Monocytes Absolute: 0.4 10*3/uL (ref 0.1–1.0)
Monocytes Relative: 5.7 % (ref 3.0–12.0)
Neutro Abs: 4.1 10*3/uL (ref 1.4–7.7)
Neutrophils Relative %: 67 % (ref 43.0–77.0)
Platelets: 213 10*3/uL (ref 150.0–400.0)
RBC: 4.49 Mil/uL (ref 4.22–5.81)
RDW: 13.3 % (ref 11.5–15.5)
WBC: 6.2 10*3/uL (ref 4.0–10.5)

## 2019-03-15 LAB — LIPID PANEL
Cholesterol: 183 mg/dL (ref 0–200)
HDL: 35.4 mg/dL — ABNORMAL LOW (ref >39.00)
LDL Cholesterol: 125 mg/dL — ABNORMAL HIGH (ref 0–99)
NonHDL: 147.76
Total CHOL/HDL Ratio: 5
Triglycerides: 116 mg/dL (ref 0.0–149.0)
VLDL: 23.2 mg/dL (ref 0.0–40.0)

## 2019-03-15 MED ORDER — CLONIDINE HCL 0.3 MG PO TABS: ORAL_TABLET | ORAL | 1 refills | Status: DC

## 2019-03-15 NOTE — Addendum Note (Signed)
Addended by: Deveron Furlong D on: 03/15/2019 09:28 AM   Modules accepted: Orders

## 2019-03-15 NOTE — Patient Instructions (Addendum)
We are committed to keeping you informed about the COVID-19 vaccine.  As the vaccine continues to become available for each phase, we will ensure that patients who meet the criteria receive the information they need to access vaccination opportunities. Continue to check your MyChart account and [email or web address] for updates. Please review the Phase 1b information below.  [facility] - [place] On Tuesday, Jan. 19, the [place] [facility] ([facility]) and [place] began large-scale COVID-19 vaccinations at the [facility]. The vaccinations are appointment only and for those 63 and older.  Walk-ins will not be accepted.  All appointments are currently filled. Please join our waiting list for the next available appointments. We will contact you when appointments become available. Please do not sign up more than once.  Join Our Waiting List  Our daily vaccination capacity will continue to increase, including expansion of our [facility] site, increasing mobile clinics across our service region and plans to open COVID-19 vaccination clinics in [place] and [place] counties in February.  On Friday, Jan. 22, we announced the need to reschedule 10,400 vaccination appointments because of the state's decision not to allocate [place] with COVID-19 vaccines for the week of Jan. 25. Our announcement of this news is available here. We are communicating directly with those who need their appointments rescheduled and with those on our waitlist. We will inform these groups immediately when state vaccine allocations enable us to schedule their vaccination appointments. We thank everyone in our community for their patience and understanding as we work with the state to ensure adequate vaccine allocations in weeks ahead. Going forward, we will schedule vaccination appointments weekly based on vaccine allocations  provided by the state.  Other Vaccination Opportunities in Our Area We are also working in partnership with county health agencies in our service counties to ensure continuing vaccination availability in the weeks and months ahead. Learn more about each county's vaccination efforts in the website links below:   [place]  [place]  [place]  [place]  [place]   [place]'s phase 1b vaccination guidelines, prioritizing those 63 and over as the next eligible group to receive the COVID-19 vaccine, are detailed at YourSpotYourShot.[place].gov.   Vaccine Safety and Effectiveness Clinical trials for the Pfizer COVID-19 vaccine involved 42,000 people and showed that the vaccine is more than 95% effective in preventing COVID-19 with no serious safety concerns. Similar results have been reported for the Moderna COVID-19 vaccine. Side effects reported in the Pfizer clinical trials include a sore arm at the injection site, fatigue, headache, chills and fever. While side effects from the Pfizer COVID-19 vaccine are higher than for a typical flu vaccine, they are lower in many ways than side effects from the leading vaccine to prevent shingles. Side effects are signs that a vaccine is working and are related to your immune system being stimulated to produce antibodies against infection. Side effects from vaccination are far less significant than health impacts from COVID-19.  Staying Informed Pharmacists, infectious disease doctors, critical care nurses and other experts at [facility] continue to speak publicly through media interviews and direct communication with our patients and communities about the safety, effectiveness and importance of vaccines to eliminate COVID-19. In addition, reliable information on vaccine safety, effectiveness, side effects and more is available on the following websites:  [place] Department of Health and Human Services COVID-19 Vaccine Information Website.  U.S. Centers  for Disease Control and Prevention COVID-19 Vaccine Public Information   Website.  Staying Safe We agree with the CDC on what we can do to help our communities get back to normal: Getting "back to normal" is going to take all of our tools. If we use all the tools we have, we stand the best chance of getting our families, communities, schools and workplaces "back to normal" sooner:  Get vaccinated as soon as vaccines become available within the phase of the state's vaccination rollout plan for which you meet the eligibility criteria.  Wear a mask.  Stay 6 feet from others and avoid crowds.  Wash hands often.  For our most current information, please visit [email or web address].     FOR MUSCLE CRAMPS:  Try over the counter Magnesium Sulfate 500mg  around bedtime nightly. Try tonic water 2-3 oz nightly. OK to continue a gatorade daily.   Health Maintenance, Male Adopting a healthy lifestyle and getting preventive care are important in promoting health and wellness. Ask your health care provider about:  The right schedule for you to have regular tests and exams.  Things you can do on your own to prevent diseases and keep yourself healthy. What should I know about diet, weight, and exercise? Eat a healthy diet   Eat a diet that includes plenty of vegetables, fruits, low-fat dairy products, and lean protein.  Do not eat a lot of foods that are high in solid fats, added sugars, or sodium. Maintain a healthy weight Body mass index (BMI) is a measurement that can be used to identify possible weight problems. It estimates body fat based on height and weight. Your health care provider can help determine your BMI and help you achieve or maintain a healthy weight. Get regular exercise Get regular exercise. This is one of the most important things you can do for your health. Most adults should:  Exercise for at least 150 minutes each week. The exercise should increase your heart rate  and make you sweat (moderate-intensity exercise).  Do strengthening exercises at least twice a week. This is in addition to the moderate-intensity exercise.  Spend less time sitting. Even light physical activity can be beneficial. Watch cholesterol and blood lipids Have your blood tested for lipids and cholesterol at 63 years of age, then have this test every 5 years. You may need to have your cholesterol levels checked more often if:  Your lipid or cholesterol levels are high.  You are older than 63 years of age.  You are at high risk for heart disease. What should I know about cancer screening? Many types of cancers can be detected early and may often be prevented. Depending on your health history and family history, you may need to have cancer screening at various ages. This may include screening for:  Colorectal cancer.  Prostate cancer.  Skin cancer.  Lung cancer. What should I know about heart disease, diabetes, and high blood pressure? Blood pressure and heart disease  High blood pressure causes heart disease and increases the risk of stroke. This is more likely to develop in people who have high blood pressure readings, are of African descent, or are overweight.  Talk with your health care provider about your target blood pressure readings.  Have your blood pressure checked: ? Every 3-5 years if you are 43-29 years of age. ? Every year if you are 23 years old or older.  If you are between the ages of 69 and 2 and are a current or former smoker, ask your health care provider if you should have  a one-time screening for abdominal aortic aneurysm (AAA). Diabetes Have regular diabetes screenings. This checks your fasting blood sugar level. Have the screening done:  Once every three years after age 79 if you are at a normal weight and have a low risk for diabetes.  More often and at a younger age if you are overweight or have a high risk for diabetes. What should I know  about preventing infection? Hepatitis B If you have a higher risk for hepatitis B, you should be screened for this virus. Talk with your health care provider to find out if you are at risk for hepatitis B infection. Hepatitis C Blood testing is recommended for:  Everyone born from [birth year] through 1964.  Anyone with known risk factors for hepatitis C. Sexually transmitted infections (STIs)  You should be screened each year for STIs, including gonorrhea and chlamydia, if: ? You are sexually active and are younger than 63 years of age. ? You are older than 63 years of age and your health care provider tells you that you are at risk for this type of infection. ? Your sexual activity has changed since you were last screened, and you are at increased risk for chlamydia or gonorrhea. Ask your health care provider if you are at risk.  Ask your health care provider about whether you are at high risk for HIV. Your health care provider may recommend a prescription medicine to help prevent HIV infection. If you choose to take medicine to prevent HIV, you should first get tested for HIV. You should then be tested every 3 months for as long as you are taking the medicine. Follow these instructions at home: Lifestyle  Do not use any products that contain nicotine or tobacco, such as cigarettes, e-cigarettes, and chewing tobacco. If you need help quitting, ask your health care provider.  Do not use street drugs.  Do not share needles.  Ask your health care provider for help if you need support or information about quitting drugs. Alcohol use  Do not drink alcohol if your health care provider tells you not to drink.  If you drink alcohol: ? Limit how much you have to 0-2 drinks a day. ? Be aware of how much alcohol is in your drink. In the [place]S., one drink equals one 12 oz bottle of beer (355 mL), one 5 oz glass of wine (148 mL), or one 1 oz glass of hard liquor (44 mL). General  instructions  Schedule regular health, dental, and eye exams.  Stay current with your vaccines.  Tell your health care provider if: ? You often feel depressed. ? You have ever been abused or do not feel safe at home. Summary  Adopting a healthy lifestyle and getting preventive care are important in promoting health and wellness.  Follow your health care provider's instructions about healthy diet, exercising, and getting tested or screened for diseases.  Follow your health care provider's instructions on monitoring your cholesterol and blood pressure. This information is not intended to replace advice given to you by your health care provider. Make sure you discuss any questions you have with your health care provider. Document Revised: 01/24/2018 Document Reviewed: 01/24/2018 Elsevier Patient Education  2020 [employer].

## 2019-03-15 NOTE — Progress Notes (Signed)
Office Note 03/15/2019  CC:  Chief Complaint  Patient presents with  . Annual Exam    pt is fasting    HPI:  Timothy Page is a 64 y.o. White male who is here for annual health maintenance exam.  Having nocturnal leg cramps both legs lately.  Has to get up and move to get them around to resolve it, happens multiple times per night.  If he drinks a gatorade around suppertime he doesn't have them.  Glucoses 115-120 fasting. Feet: no burning, tingling, or numbness.  Past Medical History:  Diagnosis Date  . Arthritis   . Atypical chest pain 04/2017   ACS ruled out 05/11/17  . CAP (community acquired pneumonia) 05/07/2017  . Diabetes mellitus without complication ([place])    type 2   . 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 Hx of adenomatous polyp of colon 07/02/2009   As of 09/2016, pt is aware he is overdue for repeat colonoscopy---he knows to call [facility] to set this up.  . Hyperlipidemia    Intol of multiple statins--started zetia 2019 but having same side effects-->doing zetia holiday as of 08/2017.  [name] Hypertension   . Lipoma    back  . 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 Cammy Copa  . Right ureteral calculus 04/2018   Cystoscopy with right retrograde pyelogram and right ureteral stent placement after stone removal.  . Seasonal allergies   . Stroke ([place])    in 02O no complications   . Tachyarrhythmia 1980   age 56 X 2 over 5 month period.  No recurrence since that time.  . Thoracic aortic aneurysm ([place])    Incidentally discovered, 4.1 cm-->Dr. Cyndia Bent. Rpt CT 08/2018 stable.  Plan rpt CT 1 yr.  . Vocal cord paralysis 07/2016   Left vocal cord paralysis noted after thyroid surgery.    Past Surgical History:  Procedure Laterality Date  . CARDIAC CATHETERIZATION  1980  . COLONOSCOPY W/ POLYPECTOMY  07/02/2009   tubular adenoma (recall 5 yrs; pt aware he is overdue as of 09/2016 and knows to call  [facility] to set this up.  . CYSTOSCOPY WITH RETROGRADE PYELOGRAM, URETEROSCOPY AND STENT PLACEMENT Right 05/09/2018   Procedure: CYSTOSCOPY WITH RIGHT RETROGRADE RIGHT URETEROSCOPY STONE EXTRACTION RIGHT STENT EXCHANGE;  Surgeon: Lucas Mallow, MD;  Location: [facility];  Service: Urology;  Laterality: Right;  . CYSTOSCOPY/RETROGRADE/URETEROSCOPY/STONE EXTRACTION WITH BASKET Right 04/30/2018   Procedure: CYSTOSCOPY/RETROGRADE/RIGHT URETEROSCOPY/;  Surgeon: Lucas Mallow, MD;  Location: [facility];  Service: Urology;  Laterality: Right;  . ESOPHAGOGASTRODUODENOSCOPY  07/02/2009   NORMAL  . EYE SURGERY    . finger amputaion     with reattachement  . LASIK    . THYROIDECTOMY N/A 08/03/2016   Procedure: Total THYROIDECTOMY;  Surgeon: [name], MD;  Location: [facility] OR;  Service: ENT;  Laterality: N/A;  Total Thyroidectomy     Family History  Problem Relation Age of Onset  . Diabetes Mother   . Cancer Mother        lung  . COPD Mother   . Brain cancer Mother   . Arthritis Mother   . Hyperlipidemia Mother   . Heart disease Mother   . Hypertension Mother   . Diabetes Father   . Cancer Father        mesothelioma; skin  . Mental illness Father   . Arthritis Father   . Hyperlipidemia Father   .  Heart disease Father   . Hypertension Father   . Heart disease Maternal Grandmother        MI @ 21  . Heart disease Maternal Grandfather        MI @ 87  . Cancer Paternal Grandmother   . Diabetes Paternal Grandmother   . Heart disease Paternal Grandfather        MI @ 36  . Thyroid cancer Maternal Aunt     Social History   Socioeconomic History  . Marital status: Married    Spouse name: Not on file  . Number of children: Not on file  . Years of education: Not on file  . Highest education level: Not on file  Occupational History  . Not on file  Tobacco Use  . Smoking status: Never Smoker  . Smokeless tobacco: Never Used  Substance and Sexual Activity  . Alcohol use: No  . Drug  use: No  . Sexual activity: Not on file  Other Topics Concern  . Not on file  Social History Narrative   Married, 2 daughters, 1 grandson that lives with him.   Educ: some college   Occup: retired from the [employer].   No T/A/Ds.   Social Determinants of Health   Financial Resource Strain:   . Difficulty of Paying Living Expenses: Not on file  Food Insecurity:   . Worried About Charity fundraiser in the Last Year: Not on file  . Ran Out of Food in the Last Year: Not on file  Transportation Needs:   . Lack of Transportation (Medical): Not on file  . Lack of Transportation (Non-Medical): Not on file  Physical Activity:   . Days of Exercise per Week: Not on file  . Minutes of Exercise per Session: Not on file  Stress:   . Feeling of Stress : Not on file  Social Connections:   . Frequency of Communication with Friends and Family: Not on file  . Frequency of Social Gatherings with Friends and Family: Not on file  . Attends Religious Services: Not on file  . Active Member of Clubs or Organizations: Not on file  . Attends Archivist Meetings: Not on file  . Marital Status: Not on file  Intimate Partner Violence:   . Fear of Current or Ex-Partner: Not on file  . Emotionally Abused: Not on file  . Physically Abused: Not on file  . Sexually Abused: Not on file    Outpatient Medications Prior to Visit  Medication Sig Dispense Refill  . amLODipine (NORVASC) 10 MG tablet TAKE 1 TABLET BY MOUTH  DAILY 90 tablet 3  . aspirin 81 MG tablet Take 81 mg by mouth daily.    . blood glucose meter kit and supplies Use to check blood sugar once daily 1 each 0  . Lancets (ONETOUCH DELICA PLUS [id number]) MISC USE TO CHECK BLOOD SUGAR  ONCE DAILY 100 each 3  . levothyroxine (SYNTHROID) 137 MCG tablet Take 1 tablet (137 mcg total) by mouth daily before breakfast. 90 tablet 1  . loratadine (CLARITIN) 10 MG tablet Take 10 mg by mouth daily as needed for allergies.    . metFORMIN  (GLUCOPHAGE) 500 MG tablet TAKE 1 TABLET BY MOUTH TWO  TIMES DAILY WITH MEALS 180 tablet 3  . mupirocin ointment (BACTROBAN) 2 % Apply 1 application topically 3 (three) times daily. 15 g 1  . ONETOUCH ULTRA test strip USE TO CHECK BLOOD SUGAR  ONCE DAILY 100 strip 3  .  ramipril (ALTACE) 10 MG capsule TAKE 1 CAPSULE BY MOUTH  TWICE DAILY 180 capsule 1  . senna-docusate (SENOKOT-S) 8.6-50 MG tablet Take 1 tablet by mouth 2 (two) times daily. 60 tablet 0  . tamsulosin (FLOMAX) 0.4 MG CAPS capsule Take 1 capsule (0.4 mg total) by mouth daily. 10 capsule 0  . cloNIDine (CATAPRES) 0.3 MG tablet Take 1 tablet (0.3 mg total) by mouth 2 (two) times daily. (Patient taking differently: Take 0.3 mg by mouth daily. Pt takes once daily.) 180 tablet 1  . cholecalciferol (VITAMIN D) 1000 units tablet Take 1,000 Units by mouth daily.      No facility-administered medications prior to visit.    Allergies  Allergen Reactions  . Atenolol     Severe "slowing"of functioning Severe "slowing"of functioning  . Hydroxyzine Other (See Comments)    "felt like I was coming out of my skin"  . Tetracycline     Rash Because of a history of documented adverse serious drug reaction;Medi Alert bracelet  is recommended Rash Because of a history of documented adverse serious drug reaction;Medi Alert braceletis recommended  . Atorvastatin     D/Ced by him due to Myalgias in Sept 2014 D/Ced by him due to Myalgias in Sept 2014  . Codeine     Hallucinations. "I see pink elephants"  . Guaifenesin     Other reaction(s): Other (See Comments) unknown unknown  . Mucinex [Guaifenesin Er] Other (See Comments)    Hallucinations "I see pink elephants"    ROS Review of Systems  Constitutional: Negative for appetite change, chills, fatigue and fever.  HENT: Negative for congestion, dental problem, ear pain and sore throat.   Eyes: Negative for discharge, redness and visual disturbance.  Respiratory: Negative for cough,  chest tightness, shortness of breath and wheezing.   Cardiovascular: Negative for chest pain, palpitations and leg swelling.  Gastrointestinal: Negative for abdominal pain, blood in stool, diarrhea, nausea and vomiting.  Genitourinary: Negative for difficulty urinating, dysuria, flank pain, frequency, hematuria and urgency.  Musculoskeletal: Negative for arthralgias, back pain, joint swelling, myalgias and neck stiffness.       Muscle cramps in legs   Skin: Negative for pallor and rash.  Neurological: Negative for dizziness, speech difficulty, weakness and headaches.  Hematological: Negative for adenopathy. Does not bruise/bleed easily.  Psychiatric/Behavioral: Negative for confusion and sleep disturbance. The patient is not nervous/anxious.     PE; Blood pressure 124/81, pulse 72, temperature 98.2 F (36.8 C), temperature source Temporal, resp. rate 16, height '5\' 9"'  (1.753 m), weight 231 lb 3.2 oz (104.9 kg), SpO2 97 %. Gen: Alert, well appearing.  Patient is oriented to person, place, time, and situation. AFFECT: pleasant, lucid thought and speech. ENT: Ears: EACs clear, normal epithelium.  TMs with good light reflex and landmarks bilaterally.  Eyes: no injection, icteris, swelling, or exudate.  EOMI, PERRLA. Nose: no drainage or turbinate edema/swelling.  No injection or focal lesion.  Mouth: lips without lesion/swelling.  Oral mucosa pink and moist.  Dentition intact and without obvious caries or gingival swelling.  Oropharynx without erythema, exudate, or swelling.  Neck: supple/nontender.  No LAD, mass, or TM.  Carotid pulses 2+ bilaterally, without bruits. CV: RRR, no m/r/g.   LUNGS: CTA bilat, nonlabored resps, good aeration in all lung fields. ABD: soft, NT, ND, BS normal.  No hepatospenomegaly or mass.  No bruits. EXT: no clubbing, cyanosis, or edema.  Foot exam - no swelling, tenderness or skin or vascular lesions. Color and temperature is  normal. Sensation is intact. Peripheral  pulses are palpable. Toenails are normal.  Musculoskeletal: no joint swelling, erythema, warmth, or tenderness.  ROM of all joints intact. Skin - no sores or suspicious lesions or rashes or color changes Rectal deferred  Pertinent labs:  Lab Results  Component Value Date   TSH 3.63 03/01/2019   Lab Results  Component Value Date   WBC 13.9 (H) 04/29/2018   HGB 13.8 04/29/2018   HCT 40.3 04/29/2018   MCV 90.2 04/29/2018   PLT 243 04/29/2018   Lab Results  Component Value Date   CREATININE 1.15 08/31/2018   BUN 24 (H) 08/31/2018   NA 139 08/31/2018   K 4.4 08/31/2018   CL 106 08/31/2018   CO2 25 08/31/2018   Lab Results  Component Value Date   ALT 15 04/29/2018   AST 15 04/29/2018   ALKPHOS 83 04/29/2018   BILITOT 1.2 04/29/2018   Lab Results  Component Value Date   CHOL 182 06/08/2018   Lab Results  Component Value Date   HDL 36.30 (L) 06/08/2018   Lab Results  Component Value Date   LDLCALC 125 (H) 06/08/2018   Lab Results  Component Value Date   TRIG 103.0 06/08/2018   Lab Results  Component Value Date   CHOLHDL 5 06/08/2018   Lab Results  Component Value Date   PSA 1.68 06/19/2018   PSA 1.99 04/30/2018   PSA 0.96 01/26/2018   Lab Results  Component Value Date   HGBA1C 6.9 (H) 08/31/2018    ASSESSMENT AND PLAN:   1) Nocturnal muscle cramps in legs:  Instructions: Try over the counter Magnesium Sulfate 553m around bedtime nightly. Try tonic water 2-3 oz nightly.  OK to continue a gatorade daily.  2) DM 2, stable home gluc monitoring. Normal feet exam today. Schedule eye exam when covid crisis calmer. HbA1c today.  3) Health maintenance exam: Reviewed age and gender appropriate health maintenance issues (prudent diet, regular exercise, health risks of tobacco and excessive alcohol, use of seatbelts, fire alarms in home, use of sunscreen).  Also reviewed age and gender appropriate health screening as well as vaccine  recommendations. Vaccines: Flu vaccine->given today.  Other wise UTD (pt declines shingrix). Labs: CMET, FLP, CBC, A1c. Prostate ca screening: PSA x 2 in 2020 normal->repeat after 06/2019. Colon ca screening:  Hx of polyps, pt overdue for f/u colonoscopy->pt says GI won't schedule this right now.  An After Visit Summary was printed and given to the patient.  FOLLOW UP:  Return in about 6 months (around 09/12/2019) for routine chronic illness f/u.  Signed:  PCrissie Sickles MD           03/15/2019

## 2019-03-19 ENCOUNTER — Other Ambulatory Visit: Payer: Self-pay

## 2019-03-19 MED ORDER — METFORMIN HCL 1000 MG PO TABS: ORAL_TABLET | ORAL | 3 refills | Status: DC

## 2019-04-25 ENCOUNTER — Other Ambulatory Visit: Payer: Self-pay | Admitting: Endocrinology

## 2019-05-12 ENCOUNTER — Other Ambulatory Visit: Payer: Self-pay | Admitting: Family Medicine

## 2019-06-13 ENCOUNTER — Telehealth: Payer: Self-pay

## 2019-06-13 NOTE — Telephone Encounter (Signed)
Patient refill request  cloNIDine (CATAPRES) 0.3 MG tablet [id number]   [facility] [id number] - [place], [place] - [address]

## 2019-06-14 ENCOUNTER — Other Ambulatory Visit: Payer: Self-pay

## 2019-06-14 MED ORDER — CLONIDINE HCL 0.3 MG PO TABS: ORAL_TABLET | ORAL | 0 refills | Status: DC

## 2019-06-14 NOTE — Telephone Encounter (Signed)
Patient advised he should still have 1 refill left available. Last Rx done 03/15/19 #90 with 1 refill. He stated pharmacy told him he did not have any refills available on file. Will call pharmacy to verify, if no refills will send in new Rx. Patient was made aware of this.

## 2019-06-14 NOTE — Telephone Encounter (Signed)
Confirmed with pharmacy that no refills available. RF sent.

## 2019-06-21 ENCOUNTER — Ambulatory Visit: Payer: 59 | Admitting: Family Medicine

## 2019-06-21 NOTE — Progress Notes (Deleted)
OFFICE VISIT  06/21/2019   CC: No chief complaint on file.    HPI:    Patient is a 64 y.o. Caucasian male who presents for 3 mo f/u DM2, HTN, HLD.  Past Medical History:  Diagnosis Date  . Arthritis   . Atypical chest pain 04/2017   ACS ruled out 05/11/17  . CAP (community acquired pneumonia) 05/07/2017  . Diabetes mellitus without complication ([place])   . 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 Hx of adenomatous polyp of colon 07/02/2009   As of 09/2016, pt is aware he is overdue for repeat colonoscopy---he knows to call [facility] to set this up.  . Hyperlipidemia    Intol of multiple statins--started zetia 2019 but having same side effects-->doing zetia holiday as of 08/2017.  [name] Hypertension   . Lipoma    back  . 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 Cammy Copa  . Right ureteral calculus 04/2018   Cystoscopy with right retrograde pyelogram and right ureteral stent placement after stone removal.  . Seasonal allergies   . Stroke ([place])    in 16X no complications   . Tachyarrhythmia 1980   age 40 X 2 over 51 month period.  No recurrence since that time.  . Thoracic aortic aneurysm ([place])    Incidentally discovered, 4.1 cm-->Dr. Cyndia Bent. Rpt CT 08/2018 stable.  Plan rpt CT 1 yr.  . Vocal cord paralysis 07/2016   Left vocal cord paralysis noted after thyroid surgery.    Past Surgical History:  Procedure Laterality Date  . CARDIAC CATHETERIZATION  1980  . COLONOSCOPY W/ POLYPECTOMY  07/02/2009   tubular adenoma (recall 5 yrs; pt aware he is overdue as of 09/2016 and knows to call  GI to set this up.  . CYSTOSCOPY WITH RETROGRADE PYELOGRAM, URETEROSCOPY AND STENT PLACEMENT Right 05/09/2018   Procedure: CYSTOSCOPY WITH RIGHT RETROGRADE RIGHT URETEROSCOPY STONE EXTRACTION RIGHT STENT EXCHANGE;  Surgeon: Lucas Mallow, MD;  Location: [facility];  Service: Urology;  Laterality: Right;  .  CYSTOSCOPY/RETROGRADE/URETEROSCOPY/STONE EXTRACTION WITH BASKET Right 04/30/2018   Procedure: CYSTOSCOPY/RETROGRADE/RIGHT URETEROSCOPY/;  Surgeon: Lucas Mallow, MD;  Location: [facility];  Service: Urology;  Laterality: Right;  . ESOPHAGOGASTRODUODENOSCOPY  07/02/2009   NORMAL  . EYE SURGERY    . finger amputaion     with reattachement  . LASIK    . THYROIDECTOMY N/A 08/03/2016   Procedure: Total THYROIDECTOMY;  Surgeon: [name], MD;  Location: [facility];  Service: ENT;  Laterality: N/A;  Total Thyroidectomy     Outpatient Medications Prior to Visit  Medication Sig Dispense Refill  . amLODipine (NORVASC) 10 MG tablet TAKE 1 TABLET BY MOUTH  DAILY 90 tablet 3  . aspirin 81 MG tablet Take 81 mg by mouth daily.    . blood glucose meter kit and supplies Use to check blood sugar once daily 1 each 0  . cholecalciferol (VITAMIN D) 1000 units tablet Take 1,000 Units by mouth daily.     . cloNIDine (CATAPRES) 0.3 MG tablet 1 tab po qhs for HTN 90 tablet 0  . Lancets (ONETOUCH DELICA PLUS [id number]) MISC USE TO CHECK BLOOD SUGAR  ONCE DAILY 100 each 3  . levothyroxine (SYNTHROID) 137 MCG tablet TAKE 1 TABLET BY MOUTH  DAILY BEFORE BREAKFAST 90 tablet 3  . loratadine (CLARITIN) 10 MG tablet Take 10 mg by mouth daily as needed for allergies.    . metFORMIN (GLUCOPHAGE) 1000 MG  tablet TAKE 1 TABLET BY MOUTH TWO  TIMES DAILY WITH MEALS 60 tablet 3  . mupirocin ointment (BACTROBAN) 2 % Apply 1 application topically 3 (three) times daily. 15 g 1  . ONETOUCH ULTRA test strip USE TO CHECK BLOOD SUGAR  ONCE DAILY 100 strip 3  . ramipril (ALTACE) 10 MG capsule TAKE 1 CAPSULE BY MOUTH  TWICE DAILY 180 capsule 1  . senna-docusate (SENOKOT-S) 8.6-50 MG tablet Take 1 tablet by mouth 2 (two) times daily. 60 tablet 0  . tamsulosin (FLOMAX) 0.4 MG CAPS capsule Take 1 capsule (0.4 mg total) by mouth daily. 10 capsule 0   No facility-administered medications prior to visit.    Allergies  Allergen Reactions  .  Atenolol     Severe "slowing"of functioning Severe "slowing"of functioning  . Hydroxyzine Other (See Comments)    "felt like I was coming out of my skin"  . Tetracycline     Rash Because of a history of documented adverse serious drug reaction;Medi Alert bracelet  is recommended Rash Because of a history of documented adverse serious drug reaction;Medi Alert braceletis recommended  . Atorvastatin     D/Ced by him due to Myalgias in Sept 2014 D/Ced by him due to Myalgias in Sept 2014  . Codeine     Hallucinations. "I see pink elephants"  . Guaifenesin     Other reaction(s): Other (See Comments) unknown unknown  . Mucinex [Guaifenesin Er] Other (See Comments)    Hallucinations "I see pink elephants"    ROS As per HPI  PE: There were no vitals taken for this visit. ***  LABS:  Lab Results  Component Value Date   TSH 3.63 03/01/2019   Lab Results  Component Value Date   WBC 6.2 03/15/2019   HGB 14.0 03/15/2019   HCT 40.7 03/15/2019   MCV 90.6 03/15/2019   PLT 213.0 03/15/2019   Lab Results  Component Value Date   CREATININE 1.14 03/15/2019   BUN 17 03/15/2019   NA 137 03/15/2019   K 4.4 03/15/2019   CL 103 03/15/2019   CO2 26 03/15/2019   Lab Results  Component Value Date   ALT 12 03/15/2019   AST 10 03/15/2019   ALKPHOS 88 03/15/2019   BILITOT 0.5 03/15/2019   Lab Results  Component Value Date   CHOL 183 03/15/2019   Lab Results  Component Value Date   HDL 35.40 (L) 03/15/2019   Lab Results  Component Value Date   LDLCALC 125 (H) 03/15/2019   Lab Results  Component Value Date   TRIG 116.0 03/15/2019   Lab Results  Component Value Date   CHOLHDL 5 03/15/2019   Lab Results  Component Value Date   PSA 1.68 06/19/2018   PSA 1.99 04/30/2018   PSA 0.96 01/26/2018   Lab Results  Component Value Date   HGBA1C 9.3 (H) 03/15/2019   IMPRESSION AND PLAN:  No problem-specific Assessment & Plan notes found for this encounter.   An After  Visit Summary was printed and given to the patient.  FOLLOW UP: No follow-ups on file.  Signed:  Crissie Sickles, MD           06/21/2019

## 2019-07-15 ENCOUNTER — Other Ambulatory Visit: Payer: Self-pay | Admitting: Family Medicine

## 2019-07-25 ENCOUNTER — Other Ambulatory Visit: Payer: Self-pay | Admitting: Surgery

## 2019-07-25 DIAGNOSIS — I712 Thoracic aortic aneurysm, without rupture, unspecified: Secondary | ICD-10-CM

## 2019-07-26 ENCOUNTER — Telehealth (INDEPENDENT_AMBULATORY_CARE_PROVIDER_SITE_OTHER): Payer: 59 | Admitting: Family Medicine

## 2019-07-26 ENCOUNTER — Encounter: Payer: Self-pay | Admitting: Family Medicine

## 2019-07-26 ENCOUNTER — Telehealth: Payer: Self-pay

## 2019-07-26 VITALS — Wt 220.0 lb

## 2019-07-26 DIAGNOSIS — I10 Essential (primary) hypertension: Secondary | ICD-10-CM | POA: Diagnosis not present

## 2019-07-26 DIAGNOSIS — R972 Elevated prostate specific antigen [PSA]: Secondary | ICD-10-CM

## 2019-07-26 DIAGNOSIS — Z789 Other specified health status: Secondary | ICD-10-CM

## 2019-07-26 DIAGNOSIS — E7800 Pure hypercholesterolemia, unspecified: Secondary | ICD-10-CM | POA: Diagnosis not present

## 2019-07-26 DIAGNOSIS — E119 Type 2 diabetes mellitus without complications: Secondary | ICD-10-CM

## 2019-07-26 NOTE — Progress Notes (Signed)
Virtual Visit via Video Note  I connected with pt on 07/26/19 at  4:00 PM EDT by a video enabled telemedicine application and verified that I am speaking with the correct person using two identifiers.  Location patient: home Location provider:work or home office Persons participating in the virtual visit: patient, provider  I discussed the limitations of evaluation and management by telemedicine and the availability of in person appointments. The patient expressed understanding and agreed to proceed.  Telemedicine visit is a necessity given the COVID-19 restrictions in place at the current time.  HPI: 64 y/o WM being seen today for 6 mo f/u DM, HTN, HLD. A/P as of last visit: "1) Nocturnal muscle cramps in legs:  Instructions: Try over the counter Magnesium Sulfate 574m around bedtime nightly. Try tonic water 2-3 oz nightly.  OK to continue a gatorade daily.  2) DM 2, stable home gluc monitoring. Normal feet exam today. Schedule eye exam when covid crisis calmer. HbA1c today.  3) Health maintenance exam: Reviewed age and gender appropriate health maintenance issues (prudent diet, regular exercise, health risks of tobacco and excessive alcohol, use of seatbelts, fire alarms in home, use of sunscreen).  Also reviewed age and gender appropriate health screening as well as vaccine recommendations. Vaccines: Flu vaccine->given today.  Other wise UTD (pt declines shingrix). Labs: CMET, FLP, CBC, A1c. Prostate ca screening: PSA x 2 in 2020 normal->repeat after 06/2019. Colon ca screening:  Hx of polyps, pt overdue for f/u colonoscopy->pt says GI won't schedule this right now.   INTERIM HX: Doing fine.  HTN: 130/80 or better each check. Clonidine makes him sleepy but he can tolerate a nighttime dose of 0.3 mg tab.   20K steps per day.  Playing golf on weekends.    Glucoses fasting normally 115-120.  Highest 155.  Only occ afternoon glucosecheck and highest has been 185 or so. A1c up  to 9.3% last visit.  I increased him to 1000 metformin bid ---he did not tolerate this from GI standpoint.  He is eating better now, tolerating metformin 5086mbid.  HLD: intol of multiple statins as well as zetia. He'll likely need to see advanced lipid clinic.  ROS: no fevers, no CP, no SOB, no wheezing, no cough, no dizziness, no HAs, no rashes, no melena/hematochezia.  No polyuria or polydipsia.  No myalgias or arthralgias.  No focal weakness, paresthesias, or tremors.  No acute vision or hearing abnormalities. No n/v/d or abd pain.  No palpitations.   .  Past Medical History:  Diagnosis Date  . Arthritis   . Atypical chest pain 04/2017   ACS ruled out 05/11/17  . CAP (community acquired pneumonia) 05/07/2017  . Diabetes mellitus without complication ([place]  . 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 Hx of adenomatous polyp of colon 07/02/2009   As of 09/2016, pt is aware he is overdue for repeat colonoscopy---he knows to call [facility] to set this up.  . Hyperlipidemia    Intol of multiple statins--started zetia 2019 but having same side effects-->doing zetia holiday as of 08/2017.  . Marland Kitchenypertension   . Lipoma    back  . 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 KUCammy Copa. Right ureteral calculus 04/2018   Cystoscopy with right retrograde pyelogram and right ureteral stent placement after stone removal.  . Seasonal allergies   . [place]   [place]   [place] 54 [place] 2 over 1 month period.  No recurrence since that time.  . Thoracic aortic aneurysm ([place])    Incidentally discovered, 4.1 cm-->Dr. Cyndia Bent. Rpt CT 08/2018 stable.  Plan rpt CT 1 yr.  . Vocal cord paralysis 07/2016   Left vocal cord paralysis noted after thyroid surgery.    Past Surgical History:  Procedure Laterality Date  . CARDIAC CATHETERIZATION  1980  . COLONOSCOPY W/ POLYPECTOMY  07/02/2009   tubular  adenoma (recall 5 yrs; pt aware he is overdue as of 09/2016 and knows to call [facility] to set this up.  . CYSTOSCOPY WITH RETROGRADE PYELOGRAM, URETEROSCOPY AND STENT PLACEMENT Right 05/09/2018   Procedure: CYSTOSCOPY WITH RIGHT RETROGRADE RIGHT URETEROSCOPY STONE EXTRACTION RIGHT STENT EXCHANGE;  Surgeon: Lucas Mallow, MD;  [facility];  Service: Urology;  Laterality: Right;  . CYSTOSCOPY/RETROGRADE/URETEROSCOPY/STONE EXTRACTION WITH BASKET Right 04/30/2018   Procedure: CYSTOSCOPY/RETROGRADE/RIGHT URETEROSCOPY/;  Surgeon: Lucas Mallow, MD;  [facility];  Service: Urology;  Laterality: Right;  . ESOPHAGOGASTRODUODENOSCOPY  07/02/2009   NORMAL  . EYE SURGERY    . finger amputaion     with reattachement  . LASIK    . THYROIDECTOMY N/A 08/03/2016   Procedure: Total THYROIDECTOMY;  Surgeon: [name], MD;  [facility];  Service: ENT;  Laterality: N/A;  Total Thyroidectomy     Family History  Problem Relation Age of Onset  . Diabetes Mother   . Cancer Mother        lung  . COPD Mother   . Brain cancer Mother   . Arthritis Mother   . Hyperlipidemia Mother   . Heart disease Mother   . Hypertension Mother   . Diabetes Father   . Cancer Father        mesothelioma; skin  . Mental illness Father   . Arthritis Father   . Hyperlipidemia Father   . Heart disease Father   . Hypertension Father   . Heart disease Maternal Grandmother        MI @ 62  . Heart disease Maternal Grandfather        MI @ 51  . Cancer Paternal Grandmother   . Diabetes Paternal Grandmother   . Heart disease Paternal Grandfather        MI @ 25  . Thyroid cancer Maternal Aunt      Current Outpatient Medications:  .  amLODipine (NORVASC) 10 MG tablet, TAKE 1 TABLET BY MOUTH  DAILY, Disp: 90 tablet, Rfl: 3 .  aspirin 81 MG tablet, Take 81 mg by mouth daily., Disp: , Rfl:  .  blood glucose meter kit and supplies, Use to check blood sugar once daily, Disp: 1 each, Rfl: 0 .   cholecalciferol (VITAMIN D) 1000 units tablet, Take 1,000 Units by mouth daily. , Disp: , Rfl:  .  cloNIDine (CATAPRES) 0.3 MG tablet, 1 tab po qhs for HTN, Disp: 90 tablet, Rfl: 0 .  Lancets (ONETOUCH DELICA PLUS [id number]) MISC, USE TO CHECK BLOOD SUGAR  ONCE DAILY, Disp: 100 each, Rfl: 3 .  levothyroxine (SYNTHROID) 137 MCG tablet, TAKE 1 TABLET BY MOUTH  DAILY BEFORE BREAKFAST, Disp: 90 tablet, Rfl: 3 .  loratadine (CLARITIN) 10 MG tablet, Take 10 mg by mouth daily as needed for allergies., Disp: , Rfl:  .  metFORMIN (GLUCOPHAGE) 500 MG tablet, Take 500 mg by mouth 2 (two) times daily., Disp: , Rfl:  .  mupirocin ointment (BACTROBAN) 2 %, Apply 1 application  topically 3 (three) times daily., Disp: 15 g, Rfl: 1 .  ONETOUCH ULTRA test strip, USE TO CHECK BLOOD SUGAR  ONCE DAILY, Disp: 100 strip, Rfl: 3 .  ramipril (ALTACE) 10 MG capsule, TAKE 1 CAPSULE BY MOUTH  TWICE DAILY, Disp: 180 capsule, Rfl: 1 .  senna-docusate (SENOKOT-S) 8.6-50 MG tablet, Take 1 tablet by mouth 2 (two) times daily., Disp: 60 tablet, Rfl: 0 .  tamsulosin (FLOMAX) 0.4 MG CAPS capsule, Take 1 capsule (0.4 mg total) by mouth daily., Disp: 10 capsule, Rfl: 0  EXAM:  VITALS per patient if applicable: Wt 220 lb (99.8 kg)   BMI 32.49 kg/m    GENERAL: alert, oriented, appears well and in no acute distress  HEENT: atraumatic, conjunttiva clear, no obvious abnormalities on inspection of external nose and ears  NECK: normal movements of the head and neck  LUNGS: on inspection no signs of respiratory distress, breathing rate appears normal, no obvious gross SOB, gasping or wheezing  CV: no obvious cyanosis  MS: moves all visible extremities without noticeable abnormality  PSYCH/NEURO: pleasant and cooperative, no obvious depression or anxiety, speech and thought processing grossly intact LABS: none today  Lab Results  Component Value Date   TSH 3.63 03/01/2019   Lab Results  Component Value Date   WBC 6.2  03/15/2019   HGB 14.0 03/15/2019   HCT 40.7 03/15/2019   MCV 90.6 03/15/2019   PLT 213.0 03/15/2019   Lab Results  Component Value Date   CREATININE 1.14 03/15/2019   BUN 17 03/15/2019   NA 137 03/15/2019   K 4.4 03/15/2019   CL 103 03/15/2019   CO2 26 03/15/2019   Lab Results  Component Value Date   ALT 12 03/15/2019   AST 10 03/15/2019   ALKPHOS 88 03/15/2019   BILITOT 0.5 03/15/2019   Lab Results  Component Value Date   CHOL 183 03/15/2019   Lab Results  Component Value Date   HDL 35.40 (L) 03/15/2019   Lab Results  Component Value Date   LDLCALC 125 (H) 03/15/2019   Lab Results  Component Value Date   TRIG 116.0 03/15/2019   Lab Results  Component Value Date   CHOLHDL 5 03/15/2019   Lab Results  Component Value Date   PSA 1.68 06/19/2018   PSA 1.99 04/30/2018   PSA 0.96 01/26/2018   Lab Results  Component Value Date   HGBA1C 9.3 (H) 03/15/2019    ASSESSMENT AND PLAN:  Discussed the following assessment and plan:  1) DM poor control at last f/u: could not tolerate full dose metformin. TOlerating metformin 587m bid fine, trying to improve diet, is very active. Hba1c, fasting glucose, and urine microalb/cr future.  2) HTN: The current medical regimen is effective;  continue present plan and medications. Lytes/cr future.  3) Hypercholesterolemia: intol of statins and zetia. FLP and hepatic panel future.  Will offer referral to advanced lipid clinic when we call him back with results.  4) Inc PSA velocity 2019. A recheck 3 mo later was a little improved.  Plan recheck PSA with upcoming lab work. -we discussed possible serious and likely etiologies, options for evaluation and workup, limitations of telemedicine visit vs in person visit, treatment, treatment risks and precautions. Pt prefers to treat via telemedicine empirically rather then risking or undertaking an in person visit at this moment. Patient agrees to seek prompt in person care if  worsening, new symptoms arise, or if is not improving with treatment.   I discussed the  assessment and treatment plan with the patient. The patient was provided an opportunity to ask questions and all were answered. The patient agreed with the plan and demonstrated an understanding of the instructions.   The patient was advised to call back or seek an in-person evaluation if the symptoms worsen or if the condition fails to improve as anticipated.  F/u: 4 mo  Signed:  Crissie Sickles, MD           07/26/2019

## 2019-07-26 NOTE — Telephone Encounter (Signed)
Left message to schedule fasting lab appt and 4 month in office f/u appt. He prefers something before 10am.

## 2019-09-09 ENCOUNTER — Other Ambulatory Visit: Payer: Self-pay | Admitting: Family Medicine

## 2019-09-11 ENCOUNTER — Other Ambulatory Visit: Payer: Self-pay

## 2019-09-11 ENCOUNTER — Encounter: Payer: Self-pay | Admitting: Surgery

## 2019-09-11 ENCOUNTER — Ambulatory Visit
Admission: RE | Admit: 2019-09-11 | Discharge: 2019-09-11 | Disposition: A | Discharge status: Home / Self Care | Payer: 59 | Source: Ambulatory Visit | Attending: Surgery | Admitting: Surgery

## 2019-09-11 ENCOUNTER — Ambulatory Visit (INDEPENDENT_AMBULATORY_CARE_PROVIDER_SITE_OTHER): Payer: 59 | Admitting: Surgery

## 2019-09-11 VITALS — BP 125/79 | HR 76 | Temp 97.5°F | Resp 16 | Ht 69.0 in | Wt 204.4 lb

## 2019-09-11 DIAGNOSIS — I712 Thoracic aortic aneurysm, without rupture, unspecified: Secondary | ICD-10-CM

## 2019-09-11 MED ORDER — IOPAMIDOL (ISOVUE-370) INJECTION 76%
75.0000 mL | Freq: Once | INTRAVENOUS | Status: AC | PRN
  Administered 2019-09-11: 75 mL via INTRAVENOUS

## 2019-09-11 NOTE — Progress Notes (Signed)
HPI:  The patient is a 64 year old gentleman who returns for follow-up of a stable 4.1 cm fusiform ascending aortic aneurysm which has not changed dating back to 07/2017.  He continues to feel well without chest pain or shortness of breath.  Current Outpatient Medications  Medication Sig Dispense Refill  . amLODipine (NORVASC) 10 MG tablet TAKE 1 TABLET BY MOUTH  DAILY 90 tablet 3  . aspirin 81 MG tablet Take 81 mg by mouth daily.    . blood glucose meter kit and supplies Use to check blood sugar once daily 1 each 0  . cloNIDine (CATAPRES) 0.3 MG tablet TAKE 1 TABLET BY MOUTH EVERY NIGHT AT BEDTIME FOR HYPERTENSION 90 tablet 0  . Lancets (ONETOUCH DELICA PLUS [id number]) MISC USE TO CHECK BLOOD SUGAR  ONCE DAILY 100 each 3  . levothyroxine (SYNTHROID) 137 MCG tablet TAKE 1 TABLET BY MOUTH  DAILY BEFORE BREAKFAST 90 tablet 3  . loratadine (CLARITIN) 10 MG tablet Take 10 mg by mouth daily as needed for allergies.    . metFORMIN (GLUCOPHAGE) 500 MG tablet Take 500 mg by mouth 2 (two) times daily.    . mupirocin ointment (BACTROBAN) 2 % Apply 1 application topically 3 (three) times daily. 15 g 1  . ONETOUCH ULTRA test strip USE TO CHECK BLOOD SUGAR  ONCE DAILY 100 strip 3  . ramipril (ALTACE) 10 MG capsule TAKE 1 CAPSULE BY MOUTH  TWICE DAILY 180 capsule 1  . senna-docusate (SENOKOT-S) 8.6-50 MG tablet Take 1 tablet by mouth 2 (two) times daily. 60 tablet 0   No current facility-administered medications for this visit.     Physical Exam: BP 125/79 (BP Location: Right Arm, Patient Position: Sitting, Cuff Size: Normal)   Pulse 76   Temp (!) 97.5 F (36.4 C)   Resp 16   Ht '5\' 9"'  (1.753 m)   Wt (!) 204 lb 6.4 oz (92.7 kg)   SpO2 96% Comment: RA  BMI 30.18 kg/m  He looks well. Cardiac exam shows regular rate and rhythm with normal heart sounds.  There is no murmur. Lungs are clear.   Diagnostic Tests:  CLINICAL DATA:  History of aneurysmal disease of the ascending thoracic  aorta.  EXAM: CT ANGIOGRAPHY CHEST WITH CONTRAST  TECHNIQUE: Multidetector CT imaging of the chest was performed using the standard protocol during bolus administration of intravenous contrast. Multiplanar CT image reconstructions and MIPs were obtained to evaluate the vascular anatomy.  CONTRAST:  57m ISOVUE-370 IOPAMIDOL (ISOVUE-370) INJECTION 76%  COMPARISON:  08/24/2018  FINDINGS: Cardiovascular: The aortic root measures approximately 3.7-3.8 cm at the level of the sinuses of Valsalva. The ascending thoracic aorta is not overtly dilated and top-normal in caliber measuring approximately 3.9 cm. The proximal arch measures 3.1 cm and the distal arch 2.8 cm. The descending thoracic aorta measures 2.6 cm. Mild calcified plaque is present at the level of the arch and descending thoracic aorta. Visualized proximal great vessels demonstrate normal patency and normal branching anatomy. No evidence of aortic dissection.  The heart size is normal. There is focal calcified plaque in the distribution of the proximal LAD. No pericardial fluid is identified. Central pulmonary arteries are normal in caliber.  Mediastinum/Nodes: No enlarged mediastinal, hilar, or axillary lymph nodes. Thyroid gland, trachea, and esophagus demonstrate no significant findings.  Lungs/Pleura: There is no evidence of pulmonary edema, consolidation, pneumothorax, nodule or pleural fluid.  Upper Abdomen: No acute abnormality.  Musculoskeletal: No chest wall abnormality. No acute or significant osseous  findings.  Review of the MIP images confirms the above findings.  IMPRESSION: 1. The ascending thoracic aorta is not overtly dilated and top-normal in caliber measuring approximately 3.9 cm. 2. Coronary atherosclerosis with focal calcified plaque in the distribution of the proximal LAD. 3. Mild aortic atherosclerosis.  Aortic Atherosclerosis (ICD10-I70.0).   Electronically Signed    By: Aletta Edouard M.D.   On: 09/11/2019 16:49   Impression:  This 64 year old gentleman has a stable 3.9 cm ascending aortic aneurysm with a measurement this year being slightly smaller than on previous scans when it was 4.1 cm.  This is a small aneurysm and well below the surgical threshold of 5.5 cm.  I reviewed the CTA images with him and answered his questions.  Given its small size and stability I think it is reasonable to check this again in 2 years.  He is in agreement with that.  Plan:  He will return to see me in 2 years with a CTA of the chest.  I spent 20 minutes performing this established patient evaluation and > 50% of this time was spent face to face counseling and coordinating the care of this patient's aortic aneurysm.   Gaye Pollack, MD [facility] [phone]

## 2019-10-22 ENCOUNTER — Other Ambulatory Visit: Payer: Self-pay | Admitting: Family Medicine

## 2019-10-23 ENCOUNTER — Other Ambulatory Visit: Payer: Self-pay | Admitting: Family Medicine

## 2019-10-30 ENCOUNTER — Other Ambulatory Visit: Payer: Self-pay

## 2019-10-30 ENCOUNTER — Ambulatory Visit (INDEPENDENT_AMBULATORY_CARE_PROVIDER_SITE_OTHER): Payer: 59 | Admitting: Family Medicine

## 2019-10-30 DIAGNOSIS — E890 Postprocedural hypothyroidism: Secondary | ICD-10-CM | POA: Diagnosis not present

## 2019-10-30 DIAGNOSIS — E119 Type 2 diabetes mellitus without complications: Secondary | ICD-10-CM | POA: Diagnosis not present

## 2019-10-30 DIAGNOSIS — I10 Essential (primary) hypertension: Secondary | ICD-10-CM | POA: Diagnosis not present

## 2019-10-30 DIAGNOSIS — R972 Elevated prostate specific antigen [PSA]: Secondary | ICD-10-CM | POA: Diagnosis not present

## 2019-10-30 DIAGNOSIS — E7800 Pure hypercholesterolemia, unspecified: Secondary | ICD-10-CM | POA: Diagnosis not present

## 2019-10-30 LAB — COMPREHENSIVE METABOLIC PANEL
ALT: 14 U/L (ref 0–53)
AST: 10 U/L (ref 0–37)
Albumin: 4.3 g/dL (ref 3.5–5.2)
Alkaline Phosphatase: 80 U/L (ref 39–117)
BUN: 15 mg/dL (ref 6–23)
CO2: 24 mEq/L (ref 19–32)
Calcium: 9.4 mg/dL (ref 8.4–10.5)
Chloride: 104 mEq/L (ref 96–112)
Creatinine, Ser: 0.97 mg/dL (ref 0.40–1.50)
GFR: 78.19 mL/min (ref >60.00)
Glucose, Bld: 362 mg/dL — ABNORMAL HIGH (ref 70–99)
Potassium: 4.7 mEq/L (ref 3.5–5.1)
Sodium: 137 mEq/L (ref 135–145)
Total Bilirubin: 0.4 mg/dL (ref 0.2–1.2)
Total Protein: 6.5 g/dL (ref 6.0–8.3)

## 2019-10-30 LAB — LIPID PANEL
Cholesterol: 176 mg/dL (ref 0–200)
HDL: 31 mg/dL — ABNORMAL LOW (ref >39.00)
NonHDL: 145.29
Total CHOL/HDL Ratio: 6
Triglycerides: 208 mg/dL — ABNORMAL HIGH (ref 0.0–149.0)
VLDL: 41.6 mg/dL — ABNORMAL HIGH (ref 0.0–40.0)

## 2019-10-30 LAB — T4, FREE: Free T4: 1.26 ng/dL (ref 0.60–1.60)

## 2019-10-30 LAB — LDL CHOLESTEROL, DIRECT: Direct LDL: 116 mg/dL

## 2019-10-30 LAB — MICROALBUMIN / CREATININE URINE RATIO
Creatinine,U: 70.4 mg/dL
Microalb Creat Ratio: 1 mg/g (ref 0.0–30.0)
Microalb, Ur: 0.7 mg/dL (ref 0.0–1.9)

## 2019-10-30 LAB — PSA: PSA: 0.73 ng/mL (ref 0.10–4.00)

## 2019-10-30 LAB — TSH: TSH: 1.35 u[IU]/mL (ref 0.35–4.50)

## 2019-10-30 LAB — HEMOGLOBIN A1C: Hgb A1c MFr Bld: 12.8 % — ABNORMAL HIGH (ref 4.6–6.5)

## 2019-11-04 ENCOUNTER — Encounter: Payer: Self-pay | Admitting: Family Medicine

## 2019-11-04 ENCOUNTER — Other Ambulatory Visit: Payer: Self-pay | Admitting: Family Medicine

## 2019-11-04 DIAGNOSIS — E782 Mixed hyperlipidemia: Secondary | ICD-10-CM

## 2019-11-04 DIAGNOSIS — Z789 Other specified health status: Secondary | ICD-10-CM

## 2019-11-04 DIAGNOSIS — E139 Other specified diabetes mellitus without complications: Secondary | ICD-10-CM

## 2019-11-04 DIAGNOSIS — M609 Myositis, unspecified: Secondary | ICD-10-CM

## 2019-11-11 NOTE — Progress Notes (Signed)
Please call to let patient know that the lab results are normal and no further action needed, keep appointment in 1/22 as scheduled

## 2019-11-15 ENCOUNTER — Ambulatory Visit (INDEPENDENT_AMBULATORY_CARE_PROVIDER_SITE_OTHER): Payer: 59

## 2019-11-15 ENCOUNTER — Other Ambulatory Visit: Payer: Self-pay

## 2019-11-15 DIAGNOSIS — E139 Other specified diabetes mellitus without complications: Secondary | ICD-10-CM

## 2019-11-15 DIAGNOSIS — K610 Anal abscess: Secondary | ICD-10-CM

## 2019-11-15 HISTORY — DX: Anal abscess: K61.0

## 2019-11-17 LAB — GLUTAMIC ACID DECARBOXYLASE AUTO ABS: Glutamic Acid Decarb Ab: <5 IU/mL (ref <5)

## 2019-11-20 ENCOUNTER — Encounter: Payer: Self-pay | Admitting: Family Medicine

## 2019-11-20 NOTE — Telephone Encounter (Signed)
Patient is sending follow up message regarding covered medications. Please send in [place] to local pharmacy

## 2019-11-21 MED ORDER — EMPAGLIFLOZIN 10 MG PO TABS: 10.0000 mg | ORAL_TABLET | Freq: Every day | ORAL | 3 refills | Status: DC

## 2019-11-21 NOTE — Telephone Encounter (Signed)
jardiance eRx'd to [facility] on [address] and [address]

## 2019-11-22 ENCOUNTER — Ambulatory Visit (INDEPENDENT_AMBULATORY_CARE_PROVIDER_SITE_OTHER): Payer: 59 | Admitting: Family Medicine

## 2019-11-22 ENCOUNTER — Ambulatory Visit (HOSPITAL_BASED_OUTPATIENT_CLINIC_OR_DEPARTMENT_OTHER)
Admission: RE | Admit: 2019-11-22 | Discharge: 2019-11-22 | Disposition: A | Discharge status: Home / Self Care | Payer: 59 | Source: Ambulatory Visit | Attending: Family Medicine | Admitting: Family Medicine

## 2019-11-22 ENCOUNTER — Other Ambulatory Visit: Payer: Self-pay

## 2019-11-22 ENCOUNTER — Encounter: Payer: Self-pay | Admitting: Family Medicine

## 2019-11-22 VITALS — BP 164/82 | HR 78 | Temp 98.4°F | Ht 69.0 in | Wt 193.0 lb

## 2019-11-22 DIAGNOSIS — R634 Abnormal weight loss: Secondary | ICD-10-CM | POA: Diagnosis not present

## 2019-11-22 DIAGNOSIS — E119 Type 2 diabetes mellitus without complications: Secondary | ICD-10-CM

## 2019-11-22 DIAGNOSIS — R300 Dysuria: Secondary | ICD-10-CM

## 2019-11-22 DIAGNOSIS — R1031 Right lower quadrant pain: Secondary | ICD-10-CM | POA: Diagnosis not present

## 2019-11-22 DIAGNOSIS — K6289 Other specified diseases of anus and rectum: Secondary | ICD-10-CM

## 2019-11-22 DIAGNOSIS — Z23 Encounter for immunization: Secondary | ICD-10-CM

## 2019-11-22 LAB — COMPREHENSIVE METABOLIC PANEL
ALT: 12 U/L (ref 0–53)
AST: 10 U/L (ref 0–37)
Albumin: 4.3 g/dL (ref 3.5–5.2)
Alkaline Phosphatase: 81 U/L (ref 39–117)
BUN: 14 mg/dL (ref 6–23)
CO2: 26 mEq/L (ref 19–32)
Calcium: 9.5 mg/dL (ref 8.4–10.5)
Chloride: 104 mEq/L (ref 96–112)
Creatinine, Ser: 0.96 mg/dL (ref 0.40–1.50)
GFR: 83.8 mL/min (ref >60.00)
Glucose, Bld: 275 mg/dL — ABNORMAL HIGH (ref 70–99)
Potassium: 4.4 mEq/L (ref 3.5–5.1)
Sodium: 139 mEq/L (ref 135–145)
Total Bilirubin: 0.6 mg/dL (ref 0.2–1.2)
Total Protein: 6.6 g/dL (ref 6.0–8.3)

## 2019-11-22 LAB — CBC WITH DIFFERENTIAL/PLATELET
Basophils Absolute: 0.1 10*3/uL (ref 0.0–0.1)
Basophils Relative: 1 % (ref 0.0–3.0)
Eosinophils Absolute: 0 10*3/uL (ref 0.0–0.7)
Eosinophils Relative: 0.5 % (ref 0.0–5.0)
HCT: 39.7 % (ref 39.0–52.0)
Hemoglobin: 13.8 g/dL (ref 13.0–17.0)
Lymphocytes Relative: 19.8 % (ref 12.0–46.0)
Lymphs Abs: 1.2 10*3/uL (ref 0.7–4.0)
MCHC: 34.7 g/dL (ref 30.0–36.0)
MCV: 91.2 fl (ref 78.0–100.0)
Monocytes Absolute: 0.3 10*3/uL (ref 0.1–1.0)
Monocytes Relative: 4.6 % (ref 3.0–12.0)
Neutro Abs: 4.4 10*3/uL (ref 1.4–7.7)
Neutrophils Relative %: 74.1 % (ref 43.0–77.0)
Platelets: 229 10*3/uL (ref 150.0–400.0)
RBC: 4.35 Mil/uL (ref 4.22–5.81)
RDW: 13.2 % (ref 11.5–15.5)
WBC: 5.9 10*3/uL (ref 4.0–10.5)

## 2019-11-22 LAB — SEDIMENTATION RATE: Sed Rate: 15 mm/hr (ref 0–20)

## 2019-11-22 LAB — POCT URINALYSIS DIPSTICK
Bilirubin, UA: NEGATIVE
Blood, UA: NEGATIVE
Glucose, UA: POSITIVE — AB
Leukocytes, UA: NEGATIVE
Nitrite, UA: NEGATIVE
Protein, UA: POSITIVE — AB
Spec Grav, UA: >=1.03 — AB (ref 1.010–1.025)
Urobilinogen, UA: 0.2 E.U./dL
pH, UA: 5 (ref 5.0–8.0)

## 2019-11-22 LAB — C-REACTIVE PROTEIN: CRP: <1 mg/dL (ref 0.5–20.0)

## 2019-11-22 MED ORDER — AMOXICILLIN-POT CLAVULANATE 875-125 MG PO TABS: 1.0000 | ORAL_TABLET | Freq: Two times a day (BID) | ORAL | 0 refills | Status: DC

## 2019-11-22 MED ORDER — IOHEXOL 300 MG/ML  SOLN
100.0000 mL | Freq: Once | INTRAMUSCULAR | Status: AC | PRN
  Administered 2019-11-22: 100 mL via INTRAVENOUS

## 2019-11-22 NOTE — Progress Notes (Signed)
OFFICE VISIT  11/22/2019  CC:  Chief Complaint  Patient presents with  . Follow-up    HPI:    Patient is a 64 y.o. Caucasian male who presents for 4 mo f/u DM 2, HTN, HLD. I last saw him via telemedicine: A/P as of last visit: "1) DM poor control at last f/u: could not tolerate full dose metformin. TOlerating metformin 550m bid fine, trying to improve diet, is very active. Hba1c, fasting glucose, and urine microalb/cr future.  2) HTN: The current medical regimen is effective;  continue present plan and medications. Lytes/cr future.  3) Hypercholesterolemia: intol of statins and zetia. FLP and hepatic panel future.  Will offer referral to advanced lipid clinic when we call him back with results.  4) Inc PSA velocity 2019. A recheck 3 mo later was a little improved.  Plan recheck PSA with upcoming lab work."  INTERIM HX: PSA normal last check < 172mogo. DM up over 12%! < 1 mo ago.  Checked GAD ab and this was neg but for unknown reason I did not check insulin or c peptide level. I referred him to advance lipid clinic 10/2019.  Currently: Pt states he is in misery: Onset pain in anal area about 2 wks ago, not improving any, getting worse.  Some BRBPR first few days--stools normal since.   Also hurting in RLQ and intermittent rad into R groin since that time, no blood in urine.  Urine darker than usual.  bMs normal.  He did not have a large and hard bm or tearing sensation with BM prior to onset of his anal pain.  He's had some mild R LB musculoskeletal type pain: not side/flank, and not CVA area. Intermittent dysuria, mainly in mornings. No fever.  ? Upset stomach, dec appetite. Wt down but he says he has not been trying to lose wt.  He did not start the jardiance I rx'd recently b/c he read that it may cause anal pain problems. HLD: has appt with advanced lipid clinic 03/2020. We didn't discuss any glucoses or bp's today due to all of our time dedicated to his acute illness  today.  ROS: no CP, no SOB, no wheezing, no cough, no dizziness, no HAs, no rashes, no melena/hematochezia.  No polyuria or polydipsia.  No myalgias or arthralgias.  No focal weakness, paresthesias, or tremors.  No acute vision or hearing abnormalities. No diarrhea.  No palpitations.      Past Medical History:  Diagnosis Date  . Arthritis   . Atypical chest pain 04/2017   ACS ruled out 05/11/17  . CAP (community acquired pneumonia) 05/07/2017  . Diabetes [place] without [place] ([place]  . 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 Hx of adenomatous polyp of colon 07/02/2009   As of 09/2016, pt is aware he is overdue for repeat colonoscopy---he knows to call [facility] to set this up.  . Hyperlipidemia    Intol of multiple statins--started zetia 2019 but having same side effects-->doing zetia holiday as of 08/2017.  . Marland Kitchenypertension   . Lipoma    back  . 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 KUCammy Copa. Right ureteral calculus 04/2018   Cystoscopy with right retrograde pyelogram and right ureteral stent placement after stone removal.  . Seasonal allergies   . [id number]   . Tachyarrhythmia 1980   age 64 2 over 8 47 montheriod.  No recurrence since that time.  . Thoracic aortic aneurysm ([place])    Incidentally discovered, 4.1 cm-->Dr. Cyndia Bent. Rpt CT 08/2018 and 08/2019 stable.  . Vocal cord paralysis 07/2016   Left vocal cord paralysis noted after thyroid surgery.    Past Surgical History:  Procedure Laterality Date  . CARDIAC CATHETERIZATION  1980  . COLONOSCOPY W/ POLYPECTOMY  07/02/2009   tubular adenoma (recall 5 yrs; pt aware he is overdue as of 09/2016 and knows to call [facility] to set this up.  . CYSTOSCOPY WITH RETROGRADE PYELOGRAM, URETEROSCOPY AND STENT PLACEMENT Right 05/09/2018   Procedure: CYSTOSCOPY WITH RIGHT RETROGRADE RIGHT URETEROSCOPY STONE EXTRACTION RIGHT STENT  EXCHANGE;  Surgeon: Lucas Mallow, MD;  [facility];  Service: Urology;  Laterality: Right;  . CYSTOSCOPY/RETROGRADE/URETEROSCOPY/STONE EXTRACTION WITH BASKET Right 04/30/2018   Procedure: CYSTOSCOPY/RETROGRADE/RIGHT URETEROSCOPY/;  Surgeon: Lucas Mallow, MD;  [facility];  Service: Urology;  Laterality: Right;  . ESOPHAGOGASTRODUODENOSCOPY  07/02/2009   NORMAL  . EYE SURGERY    . finger amputaion     with reattachement  . LASIK    . THYROIDECTOMY N/A 08/03/2016   Procedure: Total THYROIDECTOMY;  Surgeon: [name], MD;  Location: [facility];  Service: ENT;  Laterality: N/A;  Total Thyroidectomy     Outpatient Medications Prior to Visit  Medication Sig Dispense Refill  . amLODipine (NORVASC) 10 MG tablet TAKE 1 TABLET BY MOUTH  DAILY 90 tablet 1  . aspirin 81 MG tablet Take 81 mg by mouth daily.    . blood glucose meter kit and supplies Use to check blood sugar once daily 1 each 0  . cloNIDine (CATAPRES) 0.3 MG tablet TAKE 1 TABLET BY MOUTH EVERY NIGHT AT BEDTIME FOR HYPERTENSION 90 tablet 0  . Lancets (ONETOUCH DELICA PLUS [id number]) MISC USE TO CHECK BLOOD SUGAR  ONCE DAILY 100 each 3  . levothyroxine (SYNTHROID) 137 MCG tablet TAKE 1 TABLET BY MOUTH  DAILY BEFORE BREAKFAST 90 tablet 3  . loratadine (CLARITIN) 10 MG tablet Take 10 mg by mouth daily as needed for allergies.    . metFORMIN (GLUCOPHAGE) 500 MG tablet Take 500 mg by mouth 2 (two) times daily.    . mupirocin ointment (BACTROBAN) 2 % Apply 1 application topically 3 (three) times daily. 15 g 1  . ONETOUCH ULTRA test strip USE TO CHECK BLOOD SUGAR  ONCE DAILY 100 strip 3  . ramipril (ALTACE) 10 MG capsule TAKE 1 CAPSULE BY MOUTH  TWICE DAILY 180 capsule 0  . senna-docusate (SENOKOT-S) 8.6-50 MG tablet Take 1 tablet by mouth 2 (two) times daily. 60 tablet 0  . empagliflozin (JARDIANCE) 10 MG TABS tablet Take 1 tablet (10 mg total) by mouth daily before breakfast. (Patient not taking: Reported on 11/22/2019) 30  tablet 3   No facility-administered medications prior to visit.    Allergies  Allergen Reactions  . Atenolol     Severe "slowing"of functioning Severe "slowing"of functioning  . Hydroxyzine Other (See Comments)    "felt like I was coming out of my skin"  . Tetracycline     Rash Because of a history of documented adverse serious drug reaction;Medi Alert bracelet  is recommended Rash Because of a history of documented adverse serious drug reaction;Medi Alert braceletis recommended  . Atorvastatin     D/Ced by him due to Myalgias in Sept 2014 D/Ced by him due to Myalgias in Sept 2014  . Codeine     Hallucinations. "I see pink elephants"  .  Guaifenesin     Other reaction(s): Other (See Comments) unknown unknown  . Mucinex [Guaifenesin Er] Other (See Comments)    Hallucinations "I see pink elephants"    ROS As per HPI  PE: Vitals with BMI 11/22/2019 09/11/2019 07/26/2019  Height '5\' 9"'  '5\' 9"'  -  Weight 193 lbs 204 lbs 6 oz 220 lbs  BMI 63.87 56.43 -  Systolic 329 518 (No Data)  Diastolic 82 79 (No Data)  Pulse 78 76 (No Data)    Gen: Alert, tired but nontoxic appearing.  Patient is oriented to person, place, time, and situation. Lucid thought and speech. ACZ:YSAY: no injection, icteris, swelling, or exudate.  EOMI, PERRLA. Mouth: lips without lesion/swelling.  Oral mucosa pink and moist. Oropharynx without erythema, exudate, or swelling.  CV: RRR, no m/r/g.   LUNGS: CTA bilat, nonlabored resps, good aeration in all lung fields. ABD: soft, mild RLQ TTP wout guarding or rebound.  BS normal.  No hsm bruit or mass. Anal exam: VERY TENDER over entire anal/peri-anal soft tissue, but no erythema, fissure, induration, mass, or hemorrhoid is seen or palpable. Patient in too much pain to allow for digital rectal exam. BACK: no CVA or flank tenderness.  LABS:  Lab Results  Component Value Date   TSH 1.35 10/30/2019   Lab Results  Component Value Date   WBC 5.9 11/22/2019    HGB 13.8 11/22/2019   HCT 39.7 11/22/2019   MCV 91.2 11/22/2019   PLT 229.0 11/22/2019   Lab Results  Component Value Date   CREATININE 0.96 11/22/2019   BUN 14 11/22/2019   NA 139 11/22/2019   K 4.4 11/22/2019   CL 104 11/22/2019   CO2 26 11/22/2019   Lab Results  Component Value Date   ALT 12 11/22/2019   AST 10 11/22/2019   ALKPHOS 81 11/22/2019   BILITOT 0.6 11/22/2019   Lab Results  Component Value Date   CHOL 176 10/30/2019   Lab Results  Component Value Date   HDL 31.00 (L) 10/30/2019   Lab Results  Component Value Date   LDLCALC 125 (H) 03/15/2019   Lab Results  Component Value Date   TRIG 208.0 (H) 10/30/2019   Lab Results  Component Value Date   CHOLHDL 6 10/30/2019   Lab Results  Component Value Date   PSA 0.73 10/30/2019   PSA 1.68 06/19/2018   PSA 1.99 04/30/2018   Lab Results  Component Value Date   HGBA1C 12.8 Repeated and verified X2. (H) 10/30/2019   IMPRESSION AND PLAN:  1) Severe anal/rectal pain, BRBPR initially.  Some less intense RLQ abd pain as well as RLB musculoskeletal type pain.   No sign of systemic illness. Anal exam normal except for very tender. Discussed with Dr. Grandville Silos with general surgery and she recommended CT imaging to see if he possibly has a high abscess. CT abd/pelv with oral and IV contrast ordered stat. He declined any pain medication. Check CBC, CMET, ESR, CRP. UA and urine c/s.  2) DM 2, poor control the last several months: a1c up to 12.8% 3 wks ago. Dr. Dwyane Dee checked GAD ab and it was neg. Check insulin and c peptide today. Pt wanting to hold off on jardiance at this time b/c pt concerned about it possibly CAUSING anal pain --> it may be possible risk of necrotizing fasciitis from this me that he is referring to, but he has no signs of this and I think we can get him to start this med after  all this problem is cleared up. May need to simply start insulin. Consider endo ref (he already sees Dr. Dwyane Dee for  his hypothyroidism.  Flu vaccine given today.  Spent 45 min with pt today reviewing HPI, reviewing relevant past history, doing exam, reviewing and discussing lab and imaging data, and formulating plans.  An After Visit Summary was printed and given to the patient.  FOLLOW UP: Return for f/u to be determined based on results of w/u today.  Signed:  Crissie Sickles, MD           11/22/2019

## 2019-11-23 ENCOUNTER — Emergency Department (HOSPITAL_COMMUNITY)
Admission: EM | Admit: 2019-11-23 | Discharge: 2019-11-23 | Disposition: A | Discharge status: Home / Self Care | Payer: 59 | Attending: Emergency Medicine | Admitting: Emergency Medicine

## 2019-11-23 ENCOUNTER — Encounter: Payer: Self-pay | Admitting: Family Medicine

## 2019-11-23 ENCOUNTER — Other Ambulatory Visit: Payer: Self-pay

## 2019-11-23 DIAGNOSIS — Z7984 Long term (current) use of oral hypoglycemic drugs: Secondary | ICD-10-CM | POA: Diagnosis not present

## 2019-11-23 DIAGNOSIS — Z85038 Personal history of other malignant neoplasm of large intestine: Secondary | ICD-10-CM | POA: Insufficient documentation

## 2019-11-23 DIAGNOSIS — I2510 Atherosclerotic heart disease of native coronary artery without angina pectoris: Secondary | ICD-10-CM | POA: Diagnosis not present

## 2019-11-23 DIAGNOSIS — K610 Anal abscess: Secondary | ICD-10-CM

## 2019-11-23 DIAGNOSIS — E890 Postprocedural hypothyroidism: Secondary | ICD-10-CM | POA: Diagnosis not present

## 2019-11-23 DIAGNOSIS — I10 Essential (primary) hypertension: Secondary | ICD-10-CM | POA: Insufficient documentation

## 2019-11-23 DIAGNOSIS — Z79899 Other long term (current) drug therapy: Secondary | ICD-10-CM | POA: Insufficient documentation

## 2019-11-23 DIAGNOSIS — E119 Type 2 diabetes mellitus without complications: Secondary | ICD-10-CM | POA: Insufficient documentation

## 2019-11-23 DIAGNOSIS — Z7982 Long term (current) use of aspirin: Secondary | ICD-10-CM | POA: Insufficient documentation

## 2019-11-23 DIAGNOSIS — K6289 Other specified diseases of anus and rectum: Secondary | ICD-10-CM | POA: Diagnosis present

## 2019-11-23 DIAGNOSIS — Z794 Long term (current) use of insulin: Secondary | ICD-10-CM | POA: Diagnosis not present

## 2019-11-23 DIAGNOSIS — Z85850 Personal history of malignant neoplasm of thyroid: Secondary | ICD-10-CM | POA: Diagnosis not present

## 2019-11-23 HISTORY — PX: INCISION AND DRAINAGE ABSCESS ANAL: SUR669

## 2019-11-23 LAB — COMPREHENSIVE METABOLIC PANEL
ALT: 17 U/L (ref 0–44)
AST: 22 U/L (ref 15–41)
Albumin: 3.7 g/dL (ref 3.5–5.0)
Alkaline Phosphatase: 76 U/L (ref 38–126)
Anion gap: 11 (ref 5–15)
BUN: 12 mg/dL (ref 8–23)
CO2: 22 mmol/L (ref 22–32)
Calcium: 9.1 mg/dL (ref 8.9–10.3)
Chloride: 104 mmol/L (ref 98–111)
Creatinine, Ser: 0.98 mg/dL (ref 0.61–1.24)
GFR, Estimated: >60 mL/min (ref >60)
Glucose, Bld: 288 mg/dL — ABNORMAL HIGH (ref 70–99)
Potassium: 4.3 mmol/L (ref 3.5–5.1)
Sodium: 137 mmol/L (ref 135–145)
Total Bilirubin: 1.3 mg/dL — ABNORMAL HIGH (ref 0.3–1.2)
Total Protein: 6.1 g/dL — ABNORMAL LOW (ref 6.5–8.1)

## 2019-11-23 LAB — CBC WITH DIFFERENTIAL/PLATELET
Abs Immature Granulocytes: 0.01 10*3/uL (ref 0.00–0.07)
Basophils Absolute: 0.1 10*3/uL (ref 0.0–0.1)
Basophils Relative: 1 %
Eosinophils Absolute: 0.1 10*3/uL (ref 0.0–0.5)
Eosinophils Relative: 1 %
HCT: 38.5 % — ABNORMAL LOW (ref 39.0–52.0)
Hemoglobin: 13.3 g/dL (ref 13.0–17.0)
Immature Granulocytes: 0 %
Lymphocytes Relative: 22 %
Lymphs Abs: 1.4 10*3/uL (ref 0.7–4.0)
MCH: 31.5 pg (ref 26.0–34.0)
MCHC: 34.5 g/dL (ref 30.0–36.0)
MCV: 91.2 fL (ref 80.0–100.0)
Monocytes Absolute: 0.4 10*3/uL (ref 0.1–1.0)
Monocytes Relative: 6 %
Neutro Abs: 4.4 10*3/uL (ref 1.7–7.7)
Neutrophils Relative %: 70 %
Platelets: 252 10*3/uL (ref 150–400)
RBC: 4.22 MIL/uL (ref 4.22–5.81)
RDW: 12.5 % (ref 11.5–15.5)
WBC: 6.3 10*3/uL (ref 4.0–10.5)
nRBC: 0 % (ref 0.0–0.2)

## 2019-11-23 LAB — INSULIN AND C-PEPTIDE, SERUM
C-Peptide: 2.6 ng/mL (ref 1.1–4.4)
INSULIN: 8 u[IU]/mL (ref 2.6–24.9)

## 2019-11-23 LAB — LIPASE, BLOOD: Lipase: 29 U/L (ref 11–51)

## 2019-11-23 MED ORDER — AMOXICILLIN-POT CLAVULANATE 875-125 MG PO TABS
1.0000 | ORAL_TABLET | Freq: Two times a day (BID) | ORAL | 0 refills | Status: DC
Start: 2019-11-23 — End: 2019-12-02

## 2019-11-23 MED ORDER — LIDOCAINE-EPINEPHRINE 1 %-1:100000 IJ SOLN
10.0000 mL | Freq: Once | INTRAMUSCULAR | Status: DC
  Filled 2019-11-23: qty 1

## 2019-11-23 MED ORDER — AMOXICILLIN-POT CLAVULANATE 875-125 MG PO TABS
1.0000 | ORAL_TABLET | Freq: Once | ORAL | Status: AC
  Administered 2019-11-23: 1 via ORAL
  Filled 2019-11-23: qty 1

## 2019-11-23 MED ORDER — HYDROCODONE-ACETAMINOPHEN 5-325 MG PO TABS: 1.0000 | ORAL_TABLET | Freq: Four times a day (QID) | ORAL | 0 refills | Status: DC | PRN

## 2019-11-23 NOTE — Progress Notes (Signed)
Discussed results with pt in early evening 11/22/19. Of note, the pancreas abnormalities on the CT do not clinically correlate, although I did not check a lipase level.   I recommended he go to ED for further eval, potential gen surg and/or GI consults, admission, etc. He asked if he could wait until morning of 11/23/19 and I said that would be ok but said if fever, n/v, or worsening abdominal/pelvic pain or blood stool then needs to call 911 or get a ride to nearest emergency dept. He expressed understanding and agreed with plan.  Signed:  Crissie Sickles, MD           11/23/2019

## 2019-11-23 NOTE — ED Triage Notes (Signed)
Pt here for surgery consult for perianal abscess x 2 weeks. Had CT yesterday.

## 2019-11-23 NOTE — Procedures (Signed)
  Brief operative note  Preoperative diagnosis: Perianal abscess Postoperative diagnosis: Perianal abscess Procedure: Incision and drainage simple perianal abscess Surgeon: Georganna Skeans, MD  Procedure in detail: Verbal consent was obtained.  His perianal area was prepped in a sterile fashion.  I injected local anesthetic.  I made a radial incision and subcutaneous tissues were dissected down into the small abscess cavity.  Cloudy fluid was evacuated.  The wound was irrigated and I packed it with quarter inch iodoform gauze.  He tolerated this well.  A bulky sterile dressing was applied after achieving hemostasis.  Georganna Skeans, MD, MPH, FACS Please use [email or web address] to contact on call provider

## 2019-11-23 NOTE — Consult Note (Signed)
Reason for Consult:perianal abscess Referring Physician: Tajay Muzzy is an 64 y.o. male.  HPI: 65yo M with 2 week HX of perianal pain.  His primary care physician sent him for an outpatient CT scan of the abdomen and pelvis.  This showed a 1.6 cm perianal abscess posterior and to the left of his anal canal.  He was referred to the ED for evaluation.  I was asked to see him for surgical treatment.  His primary care physician started him on ampicillin.  He has been afebrile with normal white blood cell count.  Past Medical History:  Diagnosis Date  . Arthritis   . Atypical chest pain 04/2017   ACS ruled out 05/11/17  . CAP (community acquired pneumonia) 05/07/2017  . Diabetes mellitus without complication ([place])    Poor control starting fall 2021-->GAD ab neg.  Insulin and C peptide levels wnl but on lower end of normal.  . 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 Hx of adenomatous polyp of colon 07/02/2009   As of 09/2016, pt is aware he is overdue for repeat colonoscopy---he knows to call [facility] to set this up.  . Hyperlipidemia    Intol of multiple statins--started zetia 2019 but having same side effects-->doing zetia holiday as of 08/2017.  [name] Hypertension   . Lipoma    back  . 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 Cammy Copa  . Right ureteral calculus 04/2018   Cystoscopy with right retrograde pyelogram and right ureteral stent placement after stone removal.  . Seasonal allergies   . Stroke ([place])    in 26S no complications   . Tachyarrhythmia 1980   age 32 X 2 over 63 month period.  No recurrence since that time.  . Thoracic aortic aneurysm ([place])    Incidentally discovered, 4.1 cm-->Dr. Cyndia Bent. Rpt CT 08/2018 and 08/2019 stable.  . Vocal cord paralysis 07/2016   Left vocal cord paralysis noted after thyroid surgery.    Past Surgical History:  Procedure Laterality Date  . CARDIAC  CATHETERIZATION  1980  . COLONOSCOPY W/ POLYPECTOMY  07/02/2009   tubular adenoma (recall 5 yrs; pt aware he is overdue as of 09/2016 and knows to call [facility] to set this up.  . CYSTOSCOPY WITH RETROGRADE PYELOGRAM, URETEROSCOPY AND STENT PLACEMENT Right 05/09/2018   Procedure: CYSTOSCOPY WITH RIGHT RETROGRADE RIGHT URETEROSCOPY STONE EXTRACTION RIGHT STENT EXCHANGE;  Surgeon: Lucas Mallow, MD;  [facility];  Service: Urology;  Laterality: Right;  . CYSTOSCOPY/RETROGRADE/URETEROSCOPY/STONE EXTRACTION WITH BASKET Right 04/30/2018   Procedure: CYSTOSCOPY/RETROGRADE/RIGHT URETEROSCOPY/;  Surgeon: Lucas Mallow, MD;  [facility];  Service: Urology;  Laterality: Right;  . ESOPHAGOGASTRODUODENOSCOPY  07/02/2009   NORMAL  . EYE SURGERY    . finger amputaion     with reattachement  . LASIK    . THYROIDECTOMY N/A 08/03/2016   Procedure: Total THYROIDECTOMY;  Surgeon: [name], MD;  Location: [facility];  Service: ENT;  Laterality: N/A;  Total Thyroidectomy     Family History  Problem Relation Age of Onset  . Diabetes Mother   . Cancer Mother        lung  . COPD Mother   . Brain cancer Mother   . Arthritis Mother   . Hyperlipidemia Mother   . Heart disease Mother   . Hypertension Mother   . Diabetes Father   . Cancer Father  mesothelioma; skin  . Mental illness Father   . Arthritis Father   . Hyperlipidemia Father   . Heart disease Father   . Hypertension Father   . Heart disease Maternal Grandmother        MI @ 29  . Heart disease Maternal Grandfather        MI @ 52  . Cancer Paternal Grandmother   . Diabetes Paternal Grandmother   . Heart disease Paternal Grandfather        MI @ 32  . Thyroid cancer Maternal Aunt     Social History:  reports that he has never smoked. He has never used smokeless tobacco. He reports that he does not drink alcohol and does not use drugs.  Allergies:  Allergies  Allergen Reactions  . Atenolol     Severe "slowing"of  functioning Severe "slowing"of functioning  . Hydroxyzine Other (See Comments)    "felt like I was coming out of my skin"  . Tetracycline     Rash Because of a history of documented adverse serious drug reaction;Medi Alert bracelet  is recommended Rash Because of a history of documented adverse serious drug reaction;Medi Alert braceletis recommended  . Atorvastatin     D/Ced by him due to Myalgias in Sept 2014 D/Ced by him due to Myalgias in Sept 2014  . Codeine     Hallucinations. "I see pink elephants"  . Guaifenesin     Other reaction(s): Other (See Comments) unknown unknown  . Mucinex [Guaifenesin Er] Other (See Comments)    Hallucinations "I see pink elephants"    Medications: .   Results for orders placed or performed during the hospital encounter of 11/23/19 (from the past 48 hour(s))  CBC with Differential     Status: Abnormal   Collection Time: 11/23/19  2:00 PM  Result Value Ref Range   WBC 6.3 4.0 - 10.5 K/uL   RBC 4.22 4.22 - 5.81 MIL/uL   Hemoglobin 13.3 13.0 - 17.0 g/dL   HCT 38.5 (L) 39 - 52 %   MCV 91.2 80.0 - 100.0 fL   MCH 31.5 26.0 - 34.0 pg   MCHC 34.5 30.0 - 36.0 g/dL   RDW 12.5 11.5 - 15.5 %   Platelets 252 150 - 400 K/uL   nRBC 0.0 0.0 - 0.2 %   Neutrophils Relative % 70 %   Neutro Abs 4.4 1.7 - 7.7 K/uL   Lymphocytes Relative 22 %   Lymphs Abs 1.4 0.7 - 4.0 K/uL   Monocytes Relative 6 %   Monocytes Absolute 0.4 0.1 - 1.0 K/uL   Eosinophils Relative 1 %   Eosinophils Absolute 0.1 0 - 0 K/uL   Basophils Relative 1 %   Basophils Absolute 0.1 0 - 0 K/uL   Immature Granulocytes 0 %   Abs Immature Granulocytes 0.01 0.00 - 0.07 K/uL    Comment: Performed at [facility], [address] [place], [place] [address]    CT ABDOMEN PELVIS W CONTRAST  Result Date: 11/22/2019 CLINICAL DATA:  Abdominal pain for 2 weeks EXAM: CT ABDOMEN AND PELVIS WITH CONTRAST TECHNIQUE: Multidetector CT imaging of the abdomen and pelvis was performed using the  standard protocol following bolus administration of intravenous contrast. CONTRAST:  154mL OMNIPAQUE IOHEXOL 300 MG/ML  SOLN COMPARISON:  04/29/2018 FINDINGS: Lower chest: No acute abnormality. Hepatobiliary: Mild fatty infiltration of the liver is noted. The gallbladder is within normal limits. Pancreas: Pancreas demonstrates some mild peripancreatic inflammatory changes well as mid  ductal dilatation. These changes may represent early pancreatitis. Correlate with lipase levels. Spleen: Normal in size without focal abnormality. Adrenals/Urinary Tract: Adrenal glands are within normal limits bilaterally. Kidneys demonstrate a normal enhancement pattern. No renal calculi or obstructive changes are seen. The ureters are within normal limits. The bladder is decompressed. Stomach/Bowel: The appendix is well visualized and within normal limits. Colon shows no obstructive or inflammatory changes. There is however in the distal rectum/anus some asymmetric soft tissue density on the left. This is best visualized on image number 84 of series 5 this may simply represent some asymmetric distension. Just below this adjacent to the anus there is a small peripherally enhancing area of fluid measuring 1.6 cm in greatest dimension. This is best visualized on the coronal image 68 of series 6. This could represent a small perianal abscess. Correlation with the physical exam is recommended. Small bowel and stomach appear within normal limits. Vascular/Lymphatic: Aortic atherosclerosis. No enlarged abdominal or pelvic lymph nodes. Reproductive: Prostate is unremarkable. Other: No abdominal wall hernia or abnormality. No abdominopelvic ascites. Musculoskeletal: Degenerative changes of lumbar spine are seen. IMPRESSION: Small peripherally enhancing fluid collection in the low in the pelvis adjacent to the anus likely representing a small perianal abscess. Correlation with the physical exam is recommended. A few cm superior to this there  is some asymmetric wall thickening which may be related incomplete distension although the possibility of a focal mass lesion could not be totally excluded. Correlation with the physical exam is recommended as well. Mild inflammatory changes surrounding the pancreas which may represent some early pancreatitis. Clinical correlation is recommended with lipase values Electronically Signed   By: Inez Catalina M.D.   On: 11/22/2019 17:43    Review of Systems  Constitutional: Negative.   HENT: Negative.   Eyes: Negative.   Respiratory: Negative.   Cardiovascular: Negative.   Gastrointestinal:       Anal pain without bleeding or discharge  Endocrine: Negative.   Genitourinary: Negative.   Musculoskeletal: Negative.   Allergic/Immunologic: Negative.   Neurological: Negative.   Hematological: Negative.   Psychiatric/Behavioral: Negative.    Blood pressure 137/82, pulse 78, temperature 98.1 F (36.7 C), temperature source Oral, resp. rate 16, SpO2 98 %. Physical Exam Constitutional:      Appearance: Normal appearance.  HENT:     Right Ear: External ear normal.     Left Ear: External ear normal.     Mouth/Throat:     Mouth: Mucous membranes are moist.  Eyes:     Pupils: Pupils are equal, round, and reactive to light.  Cardiovascular:     Rate and Rhythm: Normal rate and regular rhythm.  Pulmonary:     Effort: Pulmonary effort is normal.     Breath sounds: Normal breath sounds.  Abdominal:     General: Abdomen is flat. There is no distension.     Palpations: Abdomen is soft.     Tenderness: There is no abdominal tenderness. There is no guarding.     Comments: Perianal exam reveals a small approximately 1 cm fluctuant area posterior into the left of his anal area  Musculoskeletal:        General: Normal range of motion.     Cervical back: Normal range of motion.  Skin:    General: Skin is warm.     Capillary Refill: Capillary refill takes less than 2 seconds.  Neurological:      Mental Status: He is alert and oriented to person, place, and time.  Psychiatric:        Mood and Affect: Mood normal.     Assessment/Plan: Small perianal abscess - will I&D at the bedside.  I advised him regarding sitz bath's.  There is some packing in his wound that will fall out and that is okay.  He should keep it covered until it stops draining.  Recommend changing antibiotic to Augmentin.  Follow-up in our urgent clinic on Monday at [place].  Please have him call [phone] on Monday morning for an appointment time.  Zenovia Jarred 11/23/2019, 2:26 PM

## 2019-11-23 NOTE — Discharge Instructions (Addendum)
  Please return for any problem.  Follow-up with general surgery on Monday at the [facility] [place] [facility].

## 2019-11-23 NOTE — ED Provider Notes (Signed)
[facility] EMERGENCY DEPARTMENT Provider Note   CSN: [id number] Arrival date & time: 11/23/19  1118     History Chief Complaint  Patient presents with  . Abscess    Timothy Page is a 64 y.o. male.  64 year old male with prior medical history as detailed below presents for evaluation of perirectal pain.  Patient reports 2 weeks of persistent perirectal pain.  Patient's pain is worse with sitting or with defecation.  He denies fever.  He denies current rectal bleeding.  He was seen for the same complaint yesterday at his PCP.  Patient obtain a CT abdomen pelvis yesterday.  CT findings suggest possibility of 1.6 cm perianal abscess.  Patient was sent to the ED for evaluation of same.  The history is provided by the patient.  Illness Location:  Rectal pain Severity:  Moderate Onset quality:  Gradual Duration:  2 weeks Timing:  Constant Progression:  Worsening Chronicity:  New Associated symptoms: no fever        Past Medical History:  Diagnosis Date  . Arthritis   . Atypical chest pain 04/2017   ACS ruled out 05/11/17  . CAP (community acquired pneumonia) 05/07/2017  . Diabetes mellitus without complication ([place])    Poor control starting fall 2021-->GAD ab neg.  Insulin and C peptide levels wnl but on lower end of normal.  . 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 Hx of adenomatous polyp of colon 07/02/2009   As of 09/2016, pt is aware he is overdue for repeat colonoscopy---he knows to call [facility] to set this up.  . Hyperlipidemia    Intol of multiple statins--started zetia 2019 but having same side effects-->doing zetia holiday as of 08/2017.  [name] Hypertension   . Lipoma    back  . 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 Cammy Copa  . Right ureteral calculus 04/2018   Cystoscopy with right retrograde pyelogram and right ureteral stent placement after stone removal.  . Seasonal  allergies   . Stroke ([place])    in 02V no complications   . Tachyarrhythmia 1980   age 50 X 2 over 80 month period.  No recurrence since that time.  . Thoracic aortic aneurysm ([place])    Incidentally discovered, 4.1 cm-->Dr. Cyndia Bent. Rpt CT 08/2018 and 08/2019 stable.  . Vocal cord paralysis 07/2016   Left vocal cord paralysis noted after thyroid surgery.    Patient Active Problem List   Diagnosis Date Noted  . Obesity (BMI 30.0-34.9) 08/31/2018  . Adjustment disorder with mixed anxiety and depressed mood   . Chest pain 05/11/2017  . Anxiety about health 05/11/2017  . Community acquired pneumonia 05/07/2017  . Lung nodule   . Abdominal aortic aneurysm (AAA) without rupture ([place])   . Hyperlipidemia   . 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 Vocal cord paralysis 08/12/2016  . Goiter 07/31/2016  . Cough 03/06/2015  . Controlled type 2 diabetes mellitus without complication, without long-term current use of insulin ([place]) 08/09/2012  . Essential hypertension 08/07/2012  . Mixed hyperlipidemia 08/07/2012  . CAD (coronary artery disease) 04/28/2011  . Unspecified adverse effect of unspecified drug, medicinal and biological substance 04/28/2011  . Lipoma 03/01/2011  . Tremor 03/01/2011  . Hx of adenomatous polyp of colon 07/02/2009  . UNSPECIFIED PROSTATITIS 05/05/2009    Past Surgical History:  Procedure Laterality Date  . CARDIAC CATHETERIZATION  1980  . COLONOSCOPY W/ POLYPECTOMY  07/02/2009  tubular adenoma (recall 5 yrs; pt aware he is overdue as of 09/2016 and knows to call [facility] to set this up.  . CYSTOSCOPY WITH RETROGRADE PYELOGRAM, URETEROSCOPY AND STENT PLACEMENT Right 05/09/2018   Procedure: CYSTOSCOPY WITH RIGHT RETROGRADE RIGHT URETEROSCOPY STONE EXTRACTION RIGHT STENT EXCHANGE;  Surgeon: Lucas Mallow, MD;  Location: [facility];  Service: Urology;  Laterality: Right;  . CYSTOSCOPY/RETROGRADE/URETEROSCOPY/STONE EXTRACTION WITH BASKET Right 04/30/2018    Procedure: CYSTOSCOPY/RETROGRADE/RIGHT URETEROSCOPY/;  Surgeon: Lucas Mallow, MD;  Location: [facility];  Service: Urology;  Laterality: Right;  . ESOPHAGOGASTRODUODENOSCOPY  07/02/2009   NORMAL  . EYE SURGERY    . finger amputaion     with reattachement  . LASIK    . THYROIDECTOMY N/A 08/03/2016   Procedure: Total THYROIDECTOMY;  Surgeon: [name], MD;  Location: [facility] OR;  Service: ENT;  Laterality: N/A;  Total Thyroidectomy        Family History  Problem Relation Age of Onset  . Diabetes Mother   . Cancer Mother        lung  . COPD Mother   . Brain cancer Mother   . Arthritis Mother   . Hyperlipidemia Mother   . Heart disease Mother   . Hypertension Mother   . Diabetes Father   . Cancer Father        mesothelioma; skin  . Mental illness Father   . Arthritis Father   . Hyperlipidemia Father   . Heart disease Father   . Hypertension Father   . Heart disease Maternal Grandmother        MI @ 52  . Heart disease Maternal Grandfather        MI @ 64  . Cancer Paternal Grandmother   . Diabetes Paternal Grandmother   . Heart disease Paternal Grandfather        MI @ 27  . Thyroid cancer Maternal Aunt     Social History   Tobacco Use  . Smoking status: Never Smoker  . Smokeless tobacco: Never Used  Vaping Use  . Vaping Use: Never used  Substance Use Topics  . Alcohol use: No  . Drug use: No    Home Medications Prior to Admission medications   Medication Sig Start Date End Date Taking? Authorizing Provider  amLODipine (NORVASC) 10 MG tablet TAKE 1 TABLET BY MOUTH  DAILY 10/23/19   McGowen, Adrian Blackwater, MD  amoxicillin-clavulanate (AUGMENTIN) 875-125 MG tablet Take 1 tablet by mouth 2 (two) times daily. 11/22/19   McGowen, Adrian Blackwater, MD  aspirin 81 MG tablet Take 81 mg by mouth daily.    [provider]  blood glucose meter kit and supplies Use to check blood sugar once daily 04/16/18   McGowen, Adrian Blackwater, MD  cloNIDine (CATAPRES) 0.3 MG tablet TAKE 1 TABLET BY  MOUTH EVERY NIGHT AT BEDTIME FOR HYPERTENSION 09/09/19   McGowen, Adrian Blackwater, MD  empagliflozin (JARDIANCE) 10 MG TABS tablet Take 1 tablet (10 mg total) by mouth daily before breakfast. Patient not taking: Reported on 11/22/2019 11/21/19   McGowen, Adrian Blackwater, MD  Lancets (ONETOUCH DELICA PLUS [id number]) [place] USE TO [place] 07/16/19   McGowen, Adrian Blackwater, MD  levothyroxine (SYNTHROID) 137 MCG tablet TAKE 1 TABLET BY MOUTH  DAILY BEFORE BREAKFAST 04/25/19   Elayne Snare, MD  loratadine (CLARITIN) 10 MG tablet Take 10 mg by mouth daily as needed for allergies.    [provider]  metFORMIN (GLUCOPHAGE) 500 MG  tablet Take 500 mg by mouth 2 (two) times daily. 07/13/19   [provider]  mupirocin ointment (BACTROBAN) 2 % Apply 1 application topically 3 (three) times daily. 08/31/18   McGowen, Adrian Blackwater, MD  ONETOUCH ULTRA test strip USE TO CHECK BLOOD SUGAR  ONCE DAILY 07/16/19   McGowen, Adrian Blackwater, MD  ramipril (ALTACE) 10 MG capsule TAKE 1 CAPSULE BY MOUTH  TWICE DAILY 10/23/19   McGowen, Adrian Blackwater, MD  senna-docusate (SENOKOT-S) 8.6-50 MG tablet Take 1 tablet by mouth 2 (two) times daily. 08/05/16   Elgergawy, Silver Huguenin, MD    Allergies    Atenolol, Hydroxyzine, Tetracycline, Atorvastatin, Codeine, Guaifenesin, and Mucinex [guaifenesin er]  Review of Systems   Review of Systems  Constitutional: Negative for fever.  All other systems reviewed and are negative.   Physical Exam Updated Vital Signs BP 137/82 (BP Location: Right Arm)   Pulse 78   Temp 98.1 F (36.7 C) (Oral)   Resp 16   SpO2 98%   Physical Exam Vitals and nursing note reviewed.  Constitutional:      General: He is not in acute distress.    Appearance: Normal appearance. He is well-developed.  HENT:     Head: Normocephalic and atraumatic.  Eyes:     Conjunctiva/sclera: Conjunctivae normal.     Pupils: Pupils are equal, round, and reactive to light.  Cardiovascular:     Rate and Rhythm: Normal rate  and regular rhythm.     Heart sounds: Normal heart sounds.  Pulmonary:     Effort: Pulmonary effort is normal. No respiratory distress.     Breath sounds: Normal breath sounds.  Abdominal:     General: There is no distension.     Palpations: Abdomen is soft.     Tenderness: There is no abdominal tenderness.  Genitourinary:    Comments: No visible perirectal erythema or induration.  DRE attempted -- patient with significant pain with attempted insertion of finger.  Patient did not tolerate DRE. Musculoskeletal:        General: No deformity. Normal range of motion.     Cervical back: Normal range of motion and neck supple.  Skin:    General: Skin is warm and dry.  Neurological:     Mental Status: He is alert and oriented to person, place, and time.     ED Results / Procedures / Treatments   Labs (all labs ordered are listed, but only abnormal results are displayed) Labs Reviewed  CBC WITH DIFFERENTIAL/PLATELET - Abnormal; Notable for the following components:      Result Value   HCT 38.5 (*)    All other components within normal limits  COMPREHENSIVE METABOLIC PANEL  LIPASE, BLOOD    EKG None  Radiology CT ABDOMEN PELVIS W CONTRAST  Result Date: 11/22/2019 CLINICAL DATA:  Abdominal pain for 2 weeks EXAM: CT ABDOMEN AND PELVIS WITH CONTRAST TECHNIQUE: Multidetector CT imaging of the abdomen and pelvis was performed using the standard protocol following bolus administration of intravenous contrast. CONTRAST:  173m OMNIPAQUE IOHEXOL 300 MG/ML  SOLN COMPARISON:  04/29/2018 FINDINGS: Lower chest: No acute abnormality. Hepatobiliary: Mild fatty infiltration of the liver is noted. The gallbladder is within normal limits. Pancreas: Pancreas demonstrates some mild peripancreatic inflammatory changes well as mid ductal dilatation. These changes may represent early pancreatitis. Correlate with lipase levels. Spleen: Normal in size without focal abnormality. Adrenals/Urinary Tract:  Adrenal glands are within normal limits bilaterally. Kidneys demonstrate a normal enhancement pattern. No renal calculi or  obstructive changes are seen. The ureters are within normal limits. The bladder is decompressed. Stomach/Bowel: The appendix is well visualized and within normal limits. Colon shows no obstructive or inflammatory changes. There is however in the distal rectum/anus some asymmetric soft tissue density on the left. This is best visualized on image number 84 of series 5 this may simply represent some asymmetric distension. Just below this adjacent to the anus there is a small peripherally enhancing area of fluid measuring 1.6 cm in greatest dimension. This is best visualized on the coronal image 68 of series 6. This could represent a small perianal abscess. Correlation with the physical exam is recommended. Small bowel and stomach appear within normal limits. Vascular/Lymphatic: Aortic atherosclerosis. No enlarged abdominal or pelvic lymph nodes. Reproductive: Prostate is unremarkable. Other: No abdominal wall hernia or abnormality. No abdominopelvic ascites. Musculoskeletal: Degenerative changes of lumbar spine are seen. IMPRESSION: Small peripherally enhancing fluid collection in the low in the pelvis adjacent to the anus likely representing a small perianal abscess. Correlation with the physical exam is recommended. A few cm superior to this there is some asymmetric wall thickening which may be related incomplete distension although the possibility of a focal mass lesion could not be totally excluded. Correlation with the physical exam is recommended as well. Mild inflammatory changes surrounding the pancreas which may represent some early pancreatitis. Clinical correlation is recommended with lipase values Electronically Signed   By: Inez Catalina M.D.   On: 11/22/2019 17:43    Procedures Procedures (including critical care time)  Medications Ordered in ED Medications - No data to  display  ED Course  I have reviewed the triage vital signs and the nursing notes.  Pertinent labs & imaging results that were available during my care of the patient were reviewed by me and considered in my medical decision making (see chart for details).    MDM Rules/Calculators/A&P                          MDM  Screen complete  TION TSE was evaluated in Emergency Department on 11/23/2019 for the symptoms described in the history of present illness. He was evaluated in the context of the global COVID-19 pandemic, which necessitated consideration that the patient might be at risk for infection with the SARS-CoV-2 virus that causes COVID-19. Institutional protocols and algorithms that pertain to the evaluation of patients at risk for COVID-19 are in a state of rapid change based on information released by regulatory bodies including the CDC and federal and state organizations. These policies and algorithms were followed during the patient's care in the ED.   Patient is presenting for evaluation of rectal pain.  CT imaging performed yesterday as an outpatient showed likely perianal abscess.  Patient was evaluated by general surgery at the bedside.  Dr. Grandville Silos drain the perianal abscess without difficulty.  Patient likely will be appropriate for discharge home pending completion of screening labs in the ED.  Patient to be taken to home with Augmentin and pain medication.  Patient to follow-up with the [facility] surgery clinic on Monday.   Final Clinical Impression(s) / ED Diagnoses Final diagnoses:  Perianal abscess    Rx / DC Orders ED Discharge Orders         Ordered    amoxicillin-clavulanate (AUGMENTIN) 875-125 MG tablet  Every 12 hours        11/23/19 1439    HYDROcodone-acetaminophen (NORCO/VICODIN) 5-325 MG tablet  Every  6 hours PRN        11/23/19 1439           Valarie Merino, MD 11/23/19 1504

## 2019-11-24 LAB — URINE CULTURE
MICRO NUMBER:: 11049867
Result:: NO GROWTH
SPECIMEN QUALITY:: ADEQUATE

## 2019-11-25 ENCOUNTER — Telehealth: Payer: Self-pay

## 2019-11-25 NOTE — Telephone Encounter (Signed)
Patient scheduled for 4pm appt on Thursday 10/14

## 2019-11-25 NOTE — Telephone Encounter (Signed)
OK, can he come in for f/u late in afternoon on wed, thurs, or Friday this week?  I have a few openings during that time period right now.

## 2019-11-25 NOTE — Telephone Encounter (Signed)
Spoke with patient's wife, Precious Bard and she was told to contact PCP office to see if f/u appt is needed. Update was provided and he has returned to work today. Procedure done on 10/9 which included packing that has now passed. Currently taking augmentin bid and metamucil. Wife is concerned about labs done at the hospital on 10/9 and rapid weight loss/body mass. Advised labs done on 10/8 were discussed with patient but a message would be sent to PCP for next steps.    Please advise, thanks.

## 2019-11-25 NOTE — Telephone Encounter (Signed)
LM for pt to return call. ED d/c states to f/u with [facility] [place] [facility] and PCP office.

## 2019-11-25 NOTE — Telephone Encounter (Signed)
Patient's wife returned your call

## 2019-11-25 NOTE — Telephone Encounter (Signed)
Wife calling about patient (DPR)  She stated that discharge instructions from ED state to follow up with Dr. Anitra Lauth on Tuesday. She was not sure if that meant an office visit or phone follow up.   Wife - Precious Bard would like a call back. [phone]

## 2019-11-26 ENCOUNTER — Other Ambulatory Visit: Payer: Self-pay | Admitting: Family Medicine

## 2019-11-27 NOTE — Telephone Encounter (Signed)
OK, metformin eRx'd

## 2019-11-27 NOTE — Telephone Encounter (Signed)
Not prescribed by you is this okay? 

## 2019-11-28 ENCOUNTER — Ambulatory Visit: Payer: 59 | Admitting: Family Medicine

## 2019-12-02 ENCOUNTER — Ambulatory Visit (INDEPENDENT_AMBULATORY_CARE_PROVIDER_SITE_OTHER): Payer: 59 | Admitting: Family Medicine

## 2019-12-02 ENCOUNTER — Other Ambulatory Visit: Payer: Self-pay

## 2019-12-02 ENCOUNTER — Encounter: Payer: Self-pay | Admitting: Family Medicine

## 2019-12-02 VITALS — BP 115/76 | HR 73 | Temp 98.0°F | Resp 16 | Wt 194.0 lb

## 2019-12-02 DIAGNOSIS — R634 Abnormal weight loss: Secondary | ICD-10-CM

## 2019-12-02 DIAGNOSIS — Z86010 Personal history of colonic polyps: Secondary | ICD-10-CM

## 2019-12-02 DIAGNOSIS — K610 Anal abscess: Secondary | ICD-10-CM

## 2019-12-02 DIAGNOSIS — E139 Other specified diabetes mellitus without complications: Secondary | ICD-10-CM | POA: Diagnosis not present

## 2019-12-02 MED ORDER — LANTUS SOLOSTAR 100 UNIT/ML ~~LOC~~ SOPN: PEN_INJECTOR | SUBCUTANEOUS | 1 refills | Status: DC

## 2019-12-02 NOTE — Progress Notes (Signed)
OFFICE VISIT  12/02/2019  CC:  Chief Complaint  Patient presents with   Follow-up    abscess    HPI:    Patient is a 64 y.o. Caucasian male who presents for 10d f/u perianal abscess that he had I&D'd by gen surg in the ED on 11/23/19. Also f/u poorly controlled DM that I recently added jardiance for. Also abnormal wt loss.   INTERIM HX: Still sore but pain MUCH improved.  No fevers. Some brbpr with last 2 stools, stools are very hard.  He's taking senakot s long term. Gen surg gave him augmentin to take after the I&D. Saw gen surg for f/u 3 d/a and the site was sealed up, just some bruising present per pt.  He has been losing lots of weight (40+ lbs in the last 9-10 mo was his max, has now gained back about 10-12 lbs. Diet and activity levels have not changed during this time.  Recent CT abd/pelv with contrast 11/22/19: Distal rectum/anus->"some asymmetric soft tissue density on the left, some asymmetric wall thickening which may be related to small adjacent perianal abscess, could represent incomplete distension although the possibility of a focal mass lesion could not be totally excluded".  He hasn't started the jardiance still, af ROS: mild fatigue; no fevers, no CP, no SOB, no wheezing, no cough, no dizziness, no HAs, no rashes, no melena/hematochezia.  No polyuria or polydipsia.  No myalgias or arthralgias.  No focal weakness, paresthesias, or tremors.  No acute vision or hearing abnormalities. No n/v/d or abd pain.  No palpitations.    Past Medical History:  Diagnosis Date   Arthritis    Atypical chest pain 04/2017   ACS ruled out 05/11/17   CAP (community acquired pneumonia) 05/07/2017   Diabetes mellitus without complication ([place])    Poor control starting fall 2021-->GAD ab neg.  Insulin and C peptide levels wnl but on lower end of normal.   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   Hyperlipidemia    Intol of multiple statins and zetia. [facility] clinic appt planned 2022.   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 Cammy Copa   Right ureteral calculus 04/2018   Cystoscopy with right retrograde pyelogram and right ureteral stent placement after stone removal.   Seasonal allergies    Stroke ([place])    in 80H no complications    Tachyarrhythmia 1980   age 17 X 2 over 40 month period.  No recurrence since that time.   Thoracic aortic aneurysm ([facility])    Incidentally discovered, 4.1 cm-->Dr. Cyndia Bent. Rpt CT 08/2018 and 08/2019 stable.   Vocal cord paralysis 07/2016   Left vocal cord paralysis noted after thyroid surgery.    Past Surgical History:  Procedure Laterality Date   CARDIAC CATHETERIZATION  1980   COLONOSCOPY W/ POLYPECTOMY  07/02/2009   tubular adenoma (recall 5 yrs; pt aware he is overdue as of 09/2016 and knows to call [facility] to set this up.   CYSTOSCOPY WITH RETROGRADE PYELOGRAM, URETEROSCOPY AND STENT PLACEMENT Right 05/09/2018   Procedure: CYSTOSCOPY WITH RIGHT RETROGRADE RIGHT URETEROSCOPY STONE EXTRACTION  RIGHT STENT EXCHANGE;  Surgeon: Lucas Mallow, MD;  [facility];  Service: Urology;  Laterality: Right;   CYSTOSCOPY/RETROGRADE/URETEROSCOPY/STONE EXTRACTION WITH BASKET Right 04/30/2018   Procedure: CYSTOSCOPY/RETROGRADE/RIGHT URETEROSCOPY/;  Surgeon: Lucas Mallow, MD;  [facility]: [facility];  Service: Urology;  Laterality: Right;   ESOPHAGOGASTRODUODENOSCOPY  07/02/2009   NORMAL   EYE SURGERY     finger amputaion     with reattachement   INCISION AND DRAINAGE ABSCESS ANAL  11/23/2019   gen surg (In ED).   LASIK     THYROIDECTOMY N/A 08/03/2016   Procedure:  Total THYROIDECTOMY;  Surgeon: [name], MD;  Location: [facility];  Service: ENT;  Laterality: N/A;  Total Thyroidectomy     Outpatient Medications Prior to Visit  Medication Sig Dispense Refill   amLODipine (NORVASC) 10 MG tablet TAKE 1 TABLET BY MOUTH  DAILY 90 tablet 1   aspirin 81 MG tablet Take 81 mg by mouth daily.     blood glucose meter kit and supplies Use to check blood sugar once daily 1 each 0   cloNIDine (CATAPRES) 0.3 MG tablet TAKE 1 TABLET BY MOUTH EVERY NIGHT AT BEDTIME FOR HYPERTENSION 90 tablet 0   HYDROcodone-acetaminophen (NORCO/VICODIN) 5-325 MG tablet Take 1 tablet by mouth every 6 (six) hours as needed. 6 tablet 0   Lancets (ONETOUCH DELICA PLUS [id number]) MISC USE TO CHECK BLOOD SUGAR  ONCE DAILY 100 each 3   levothyroxine (SYNTHROID) 137 MCG tablet TAKE 1 TABLET BY MOUTH  DAILY BEFORE BREAKFAST 90 tablet 3   loratadine (CLARITIN) 10 MG tablet Take 10 mg by mouth daily as needed for allergies.     metFORMIN (GLUCOPHAGE) 500 MG tablet TAKE 1 TABLET BY MOUTH  TWICE DAILY WITH MEALS 180 tablet 3   mupirocin ointment (BACTROBAN) 2 % Apply 1 application topically 3 (three) times daily. 15 g 1   ONETOUCH ULTRA test strip USE TO CHECK BLOOD SUGAR  ONCE DAILY 100 strip 3   ramipril (ALTACE) 10 MG capsule TAKE 1 CAPSULE BY MOUTH  TWICE DAILY 180 capsule 0   senna-docusate (SENOKOT-S) 8.6-50 MG tablet Take 1 tablet by mouth 2 (two) times daily. 60 tablet 0   amoxicillin-clavulanate (AUGMENTIN) 875-125 MG tablet Take 1 tablet by mouth 2 (two) times daily. 20 tablet 0   amoxicillin-clavulanate (AUGMENTIN) 875-125 MG tablet Take 1 tablet by mouth every 12 (twelve) hours. 14 tablet 0   empagliflozin (JARDIANCE) 10 MG TABS tablet Take 1 tablet (10 mg total) by mouth daily before breakfast. 30 tablet 3   No facility-administered medications prior to visit.    Allergies  Allergen Reactions   Atenolol     Severe "slowing"of functioning Severe "slowing"of  functioning   Hydroxyzine Other (See Comments)    "felt like I was coming out of my skin"   Tetracycline     Rash Because of a history of documented adverse serious drug reaction;Medi Alert bracelet  is recommended Rash Because of a history of documented adverse serious drug reaction;Medi Alert braceletis recommended   Atorvastatin     D/Ced by him due to Myalgias in Sept 2014 D/Ced by him due to Myalgias in Sept 2014   Codeine     Hallucinations. "I see pink elephants"   Guaifenesin     Other reaction(s): Other (See Comments) unknown unknown   Mucinex [Guaifenesin Er] Other (See Comments)    Hallucinations "I see pink elephants"    ROS As per HPI  PE: Vitals with BMI  12/02/2019 11/23/2019 11/23/2019  Height - - -  Weight 194 lbs - -  BMI 15.17 - -  Systolic 616 073 710  Diastolic 76 86 82  Pulse 73 72 78     Gen: Alert, well appearing.  Patient is oriented to person, place, time, and situation. AFFECT: pleasant, lucid thought and speech. No further exam today.  LABS:  Lab Results  Component Value Date   TSH 1.35 10/30/2019   Lab Results  Component Value Date   WBC 6.3 11/23/2019   HGB 13.3 11/23/2019   HCT 38.5 (L) 11/23/2019   MCV 91.2 11/23/2019   PLT 252 11/23/2019   Lab Results  Component Value Date   CREATININE 0.98 11/23/2019   BUN 12 11/23/2019   NA 137 11/23/2019   K 4.3 11/23/2019   CL 104 11/23/2019   CO2 22 11/23/2019   Lab Results  Component Value Date   ALT 17 11/23/2019   AST 22 11/23/2019   ALKPHOS 76 11/23/2019   BILITOT 1.3 (H) 11/23/2019   Lab Results  Component Value Date   CHOL 176 10/30/2019   Lab Results  Component Value Date   HDL 31.00 (L) 10/30/2019   Lab Results  Component Value Date   LDLCALC 125 (H) 03/15/2019   Lab Results  Component Value Date   TRIG 208.0 (H) 10/30/2019   Lab Results  Component Value Date   CHOLHDL 6 10/30/2019   Lab Results  Component Value Date   PSA 0.73 10/30/2019    PSA 1.68 06/19/2018   PSA 1.99 04/30/2018   Lab Results  Component Value Date   HGBA1C 12.8 Repeated and verified X2. (H) 10/30/2019   Lab Results  Component Value Date   LIPASE 29 11/23/2019   Lab Results  Component Value Date   ESRSEDRATE 15 11/22/2019   Lab Results  Component Value Date   CRP <1.0 11/22/2019   IMPRESSION AND PLAN:  1) Perianal abscess: doing fine s/p I & D. Add miralax to try to get stool softer, continue senakot S.  2) Abnormal wt loss; unknown etiology.  Abnormal area of rectum noted on recent CT, subtle asymmetry of rectal wall. Hx of adenomatous polyp 2011, pt did not get his repeat colonoscopy. I have entered new referral for GI to eval this.  3) DM, recent very poor control. 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  D/c plan of starting jardiance. Start lantus 10 U qhs.  Titrate dose q3d to get avg fasting glucose 100-110.  An After Visit Summary was printed and given to the patient.  FOLLOW UP: Return in about 2 weeks (around 12/16/2019) for f/u DM, abnl wt loss.  Signed:  Crissie Sickles, MD           12/02/2019

## 2019-12-02 NOTE — Patient Instructions (Signed)
Inject 10 Units of insulin subcutaneously every night around bedtime.  Check fasting glucose every morning.  Increase your bedtime insulin dose by 2 units every 3 days until your fasting glucose is into the range of 100-110.  Write your glucose numbers down and bring them in for review with me in 2 wks.  Continue taking your metformin.

## 2019-12-03 ENCOUNTER — Telehealth: Payer: Self-pay

## 2019-12-03 ENCOUNTER — Other Ambulatory Visit: Payer: Self-pay

## 2019-12-03 DIAGNOSIS — E119 Type 2 diabetes mellitus without complications: Secondary | ICD-10-CM

## 2019-12-03 MED ORDER — ONETOUCH ULTRA VI STRP: ORAL_STRIP | 3 refills | Status: DC

## 2019-12-03 NOTE — Telephone Encounter (Signed)
Spoke with patient's wife and [facility] faxed over med prescription for PCP to sign.

## 2019-12-03 NOTE — Telephone Encounter (Signed)
Originally left message for patient to return call to clarify if needles were for glucometer or insulin. Found onetouch test strips on med list and refill sent. Just want to clarify with patient if anything else needed.

## 2019-12-03 NOTE — Telephone Encounter (Signed)
Patient's wife returned call, stated patient had been unable to answer phone calls. Please call her for any further information needed. Her number is [phone].

## 2019-12-03 NOTE — Telephone Encounter (Signed)
Patient needs prescription for needles called into pharmacy to check sugar/    [name], Timothy Page

## 2019-12-04 NOTE — Telephone Encounter (Signed)
Rx request received for pen needles and new meter kit. Signed and faxed back to [facility] for processing.

## 2019-12-09 ENCOUNTER — Other Ambulatory Visit: Payer: Self-pay | Admitting: Family Medicine

## 2019-12-10 ENCOUNTER — Other Ambulatory Visit: Payer: Self-pay

## 2019-12-10 ENCOUNTER — Telehealth: Payer: Self-pay | Admitting: Family Medicine

## 2019-12-10 MED ORDER — CLONIDINE HCL 0.3 MG PO TABS: ORAL_TABLET | ORAL | 0 refills | Status: DC

## 2019-12-10 NOTE — Telephone Encounter (Signed)
Spoke with patient's wife, Precious Bard and went over instructions for insulin from 10/18 visit. In note, "Inject 10 Units of insulin subcutaneously every night around bedtime.Check fasting glucose every morning.  Increase your bedtime insulin dose by 2 units every 3 days until your fasting glucose is into the range of 100-110. Write your glucose numbers down and bring them in for review with me in 2 wks". She was also advised new request submitted for meter today. RF submitted for clonidine as well.

## 2019-12-10 NOTE — Telephone Encounter (Signed)
Patient's wife called to request refill of clonidine. Please send to new pharmacy, [facility] on [address] in [place]. States she called pharmacy yesterday for refills. Also, they have questions about new insulin pen dosages. Please call patient's wife to advise, Chong Sicilian, [phone].

## 2019-12-23 ENCOUNTER — Ambulatory Visit (INDEPENDENT_AMBULATORY_CARE_PROVIDER_SITE_OTHER): Payer: 59 | Admitting: Family Medicine

## 2019-12-23 ENCOUNTER — Other Ambulatory Visit: Payer: Self-pay

## 2019-12-23 ENCOUNTER — Encounter: Payer: Self-pay | Admitting: Family Medicine

## 2019-12-23 VITALS — BP 108/70 | HR 71 | Temp 98.0°F | Wt 191.6 lb

## 2019-12-23 DIAGNOSIS — Z794 Long term (current) use of insulin: Secondary | ICD-10-CM

## 2019-12-23 DIAGNOSIS — K5909 Other constipation: Secondary | ICD-10-CM | POA: Diagnosis not present

## 2019-12-23 DIAGNOSIS — R634 Abnormal weight loss: Secondary | ICD-10-CM | POA: Diagnosis not present

## 2019-12-23 DIAGNOSIS — E119 Type 2 diabetes mellitus without complications: Secondary | ICD-10-CM

## 2019-12-23 MED ORDER — LUBIPROSTONE 24 MCG PO CAPS: ORAL_CAPSULE | ORAL | 6 refills | Status: DC

## 2019-12-23 NOTE — Progress Notes (Signed)
OFFICE VISIT  12/23/2019  CC:  Chief Complaint  Patient presents with  . Follow-up    Diabetes    HPI:    Patient is a 64 y.o. Caucasian male who presents for 3 wk f/u DM and abnormal weight loss. A/P as of last visit: "1) Perianal abscess: doing fine s/p I & D. Add miralax to try to get stool softer, continue senakot S.  2) Abnormal wt loss; unknown etiology.  Abnormal area of rectum noted on recent CT, subtle asymmetry of rectal wall. Hx of adenomatous polyp 2011, pt did not get his repeat colonoscopy. I have entered new referral for GI to eval this.  3) DM, recent very poor control. 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  D/c plan of starting jardiance. Start lantus 10 U qhs.  Titrate dose q3d to get avg fasting glucose 100-110."   INTERIM HX: Had to have peri-anal abscess I&D again since I last saw him, says it feels okay in the area now, minimal drainage.  Has gen surg f/u thisweek. Still bad constipation: taking senakot, and slowly pushing the frequency of miralax.  Fasting glucoses still mid 200s for the most part, lowest Up to 16 U lantus qhs now.  Lowest 154, highest 353----pain definitely affecting glucoses. No postprandial or hs gluc checks.  Appetite is good, no abd pain.  ROS: no fevers, no CP, no SOB, no wheezing, no cough, no dizziness, no HAs, no rashes, no melena/hematochezia.  No polyuria or polydipsia.  No myalgias or arthralgias.  No focal weakness, paresthesias, or tremors.  No acute vision or hearing abnormalities. No n/v/d or abd pain.  No palpitations.      Past Medical History:  Diagnosis Date  . Arthritis   . Atypical chest pain 04/2017   ACS ruled out 05/11/17  . CAP (community acquired pneumonia) 05/07/2017  . Diabetes mellitus without complication ([place])    Poor control starting fall 2021-->GAD ab neg.  Insulin and C peptide levels wnl but on lower end of normal.  . 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 Hx of adenomatous polyp of colon 07/02/2009   As of 09/2016, pt is aware he is overdue for repeat colonoscopy---he knows to call [facility] to set this up.  . Hyperlipidemia    Intol of multiple statins and zetia. Advanced lipid clinic appt planned 2022.  [name] Hypertension   . Lipoma    back  . Perianal abscess 11/2019   I&D'd by Dr.Thompson, gen surg, in the ED 11/23/19  . 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 Cammy Copa  . Right ureteral calculus 04/2018   Cystoscopy with right retrograde pyelogram and right ureteral stent placement after stone removal.  . Seasonal allergies   . Stroke ([place])    in 77L no complications   . Tachyarrhythmia 1980   age 79 X 2 over 1 month period.  No recurrence since that time.  . Thoracic aortic aneurysm ([place])    Incidentally discovered, 4.1 cm-->Dr. Cyndia Bent. Rpt CT 08/2018 and 08/2019 stable.  . Vocal cord paralysis 07/2016   Left vocal cord paralysis noted after thyroid surgery.    Past Surgical History:  Procedure Laterality Date  . CARDIAC CATHETERIZATION  1980  . COLONOSCOPY W/ POLYPECTOMY  07/02/2009   tubular adenoma (recall 5 yrs; pt aware he is overdue as of 09/2016 and knows to call [facility] to set  this up.  . CYSTOSCOPY WITH RETROGRADE PYELOGRAM, URETEROSCOPY AND STENT PLACEMENT Right 05/09/2018   Procedure: CYSTOSCOPY WITH RIGHT RETROGRADE RIGHT URETEROSCOPY STONE EXTRACTION RIGHT STENT EXCHANGE;  Surgeon: Lucas Mallow, MD;  Location: [facility];  Service: Urology;  Laterality: Right;  . CYSTOSCOPY/RETROGRADE/URETEROSCOPY/STONE EXTRACTION WITH BASKET Right 04/30/2018   Procedure: CYSTOSCOPY/RETROGRADE/RIGHT URETEROSCOPY/;  Surgeon: Lucas Mallow, MD;  Location: [facility];  Service: Urology;  Laterality: Right;  . ESOPHAGOGASTRODUODENOSCOPY  07/02/2009   NORMAL  . EYE SURGERY    . finger amputaion     with reattachement  . INCISION AND DRAINAGE ABSCESS ANAL   11/23/2019   gen surg (In ED).  [name] LASIK    . THYROIDECTOMY N/A 08/03/2016   Procedure: Total THYROIDECTOMY;  Surgeon: [name], MD;  Location: [facility];  Service: ENT;  Laterality: N/A;  Total Thyroidectomy     Outpatient Medications Prior to Visit  Medication Sig Dispense Refill  . amLODipine (NORVASC) 10 MG tablet TAKE 1 TABLET BY MOUTH  DAILY 90 tablet 1  . aspirin 81 MG tablet Take 81 mg by mouth daily.    . Blood Glucose Monitoring Suppl (ONE TOUCH ULTRA 2) w/Device KIT USE TO CHECK BLOOD SUGAR  ONCE DAILY 1 kit 0  . cloNIDine (CATAPRES) 0.3 MG tablet TAKE 1 TABLET BY MOUTH EVERY NIGHT AT BEDTIME FOR HYPERTENSION 90 tablet 0  . glucose blood (ONETOUCH ULTRA) test strip USE TO CHECK BLOOD SUGAR  ONCE DAILY 100 strip 3  . HYDROcodone-acetaminophen (NORCO/VICODIN) 5-325 MG tablet Take 1 tablet by mouth every 6 (six) hours as needed. 6 tablet 0  . insulin glargine (LANTUS SOLOSTAR) 100 UNIT/ML Solostar Pen 10 units SQ qhs (Patient taking differently: 16 Units. 16 units SQ qhs) 15 mL 1  . Lancets (ONETOUCH DELICA PLUS [id number]) MISC USE TO CHECK BLOOD SUGAR  ONCE DAILY 100 each 3  . levothyroxine (SYNTHROID) 137 MCG tablet TAKE 1 TABLET BY MOUTH  DAILY BEFORE BREAKFAST 90 tablet 3  . loratadine (CLARITIN) 10 MG tablet Take 10 mg by mouth daily as needed for allergies.    . metFORMIN (GLUCOPHAGE) 500 MG tablet TAKE 1 TABLET BY MOUTH  TWICE DAILY WITH MEALS 180 tablet 3  . mupirocin ointment (BACTROBAN) 2 % Apply 1 application topically 3 (three) times daily. 15 g 1  . ramipril (ALTACE) 10 MG capsule TAKE 1 CAPSULE BY MOUTH  TWICE DAILY 180 capsule 0  . senna-docusate (SENOKOT-S) 8.6-50 MG tablet Take 1 tablet by mouth 2 (two) times daily. 60 tablet 0   No facility-administered medications prior to visit.    Allergies  Allergen Reactions  . Atenolol     Severe "slowing"of functioning Severe "slowing"of functioning  . Hydroxyzine Other (See Comments)    "felt like I was coming out of  my skin"  . Tetracycline     Rash Because of a history of documented adverse serious drug reaction;Medi Alert bracelet  is recommended Rash Because of a history of documented adverse serious drug reaction;Medi Alert braceletis recommended  . Atorvastatin     D/Ced by him due to Myalgias in Sept 2014 D/Ced by him due to Myalgias in Sept 2014  . Codeine     Hallucinations. "I see pink elephants"  . Guaifenesin     Other reaction(s): Other (See Comments) unknown unknown  . Mucinex [Guaifenesin Er] Other (See Comments)    Hallucinations "I see pink elephants"    ROS As per HPI  PE: Vitals with BMI 12/23/2019 12/02/2019  11/23/2019  Height - - -  Weight 191 lbs 10 oz 194 lbs -  BMI - 97.41 -  Systolic 638 453 646  Diastolic 70 76 86  Pulse 71 73 72     Gen: Alert, well appearing.  Patient is oriented to person, place, time, and situation. AFFECT: pleasant, lucid thought and speech. No further exam today.  LABS:    Chemistry      Component Value Date/Time   NA 137 11/23/2019 1400   K 4.3 11/23/2019 1400   CL 104 11/23/2019 1400   CO2 22 11/23/2019 1400   BUN 12 11/23/2019 1400   CREATININE 0.98 11/23/2019 1400   CREATININE 1.11 11/03/2017 1533      Component Value Date/Time   CALCIUM 9.1 11/23/2019 1400   ALKPHOS 76 11/23/2019 1400   AST 22 11/23/2019 1400   ALT 17 11/23/2019 1400   BILITOT 1.3 (H) 11/23/2019 1400     Lab Results  Component Value Date   WBC 6.3 11/23/2019   HGB 13.3 11/23/2019   HCT 38.5 (L) 11/23/2019   MCV 91.2 11/23/2019   PLT 252 11/23/2019   Lab Results  Component Value Date   TSH 1.35 10/30/2019   Lab Results  Component Value Date   HGBA1C 12.8 Repeated and verified X2. 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 Continue to titrate lantus by 2U q2-3d to get fasting gluc's consistently 100-110 range. Continue metformin 500 mg bid. Next a1c approx 01/29/20.  2) Chronic constipation: add amitiza  61mg, 1 bid. Continue senakot S and miralax---cont to titrate miralax for desired effect.  3) Perianal abscess, recurrent: s/p I and D again recently. Healing fine per pt, has gen surg f/u later this week.  4) Abnl wt loss: stabilized overall, but still concerning b/c no cause identified at this time. Has GI eval for ? Suspicious area on colon on CT last month while at ED for eval of peri anal abscess (plus he's due for 10 yr repeat colonoscopy anyway). No labs or sx's to suggest ominous etiology at this time. Possibly insulin-deficiency state contributing, and we may see wt stabilize as we rectify this with supplemental insulin that we've pretty recently started.  An After Visit Summary was printed and given to the patient.  FOLLOW UP: Return for 3-4 wk f/u constipation and DM.  Signed:  PCrissie Sickles MD           12/23/2019

## 2019-12-24 ENCOUNTER — Other Ambulatory Visit: Payer: Self-pay

## 2019-12-24 NOTE — Telephone Encounter (Signed)
Patient wife (DPR) calling about meds.  Insulin is increased every few days determination factor on his blood sugar results when he checks them at home.  Patient will be running out very soon.  Last filled on 10/18 but it has increased to 18 units, and wife said patient he will probably be at 20 units before weekend.  insulin glargine (LANTUS SOLOSTAR) 100 UNIT/ML Solostar Pen [[id number]]    Please note new preferred pharmacy.  Has been updated. [facility], [address] AT [address]  Please call wife Patty with any questions [phone]. She is aware no one will be calling her today.

## 2019-12-25 ENCOUNTER — Telehealth: Payer: Self-pay

## 2019-12-25 MED ORDER — LANTUS SOLOSTAR 100 UNIT/ML ~~LOC~~ SOPN
PEN_INJECTOR | SUBCUTANEOUS | 3 refills | Status: DC
Start: 2019-12-25 — End: 2020-01-29

## 2019-12-25 MED ORDER — LACTULOSE 10 G PO PACK: PACK | ORAL | 6 refills | Status: DC

## 2019-12-25 NOTE — Addendum Note (Signed)
Addended by: Tammi Sou on: 12/25/2019 05:01 PM   Modules accepted: Orders

## 2019-12-25 NOTE — Telephone Encounter (Signed)
PA denied. Please advise of alternative med options. Lactulose is the only option that does not require PA provided on list on formulary alternatives.

## 2019-12-25 NOTE — Telephone Encounter (Signed)
Patient advised and voiced understanding.  

## 2019-12-25 NOTE — Telephone Encounter (Signed)
Patient's wife, Precious Bard advised and voiced understanding. Okay per [facility]

## 2019-12-25 NOTE — Telephone Encounter (Signed)
Please advise, Pt insulin dosage has increased and will run out soon. See Dana's note. Med pending

## 2019-12-25 NOTE — Telephone Encounter (Signed)
OK, I eRx'd lactulose just now.

## 2019-12-25 NOTE — Telephone Encounter (Signed)
PA sent via covermymed on 12/25/19   Key: [id number]   Medication: Amitiza 50mcg   Dx: K23.01   Per Dr. Anitra Lauth pt has tried and failed OTC miralax and prescription (senokot-s)   Waiting for response.

## 2019-12-26 MED ORDER — LACTULOSE 10 GM/15ML PO SOLN: ORAL | 1 refills | Status: DC

## 2019-12-26 NOTE — Addendum Note (Signed)
Addended by: Tammi Sou on: 12/26/2019 12:32 PM   Modules accepted: Orders

## 2019-12-26 NOTE — Telephone Encounter (Signed)
OK, lactulose susp eRx'd

## 2019-12-26 NOTE — Telephone Encounter (Signed)
Received another fax from pharmacy regarding lactulose, packets are not available. New Rx needed for liquid solution instead.  Please advise, thanks. Medication pending

## 2019-12-26 NOTE — Addendum Note (Signed)
Addended by: Deveron Furlong D on: 12/26/2019 11:59 AM   Modules accepted: Orders

## 2020-01-07 ENCOUNTER — Other Ambulatory Visit: Payer: Self-pay | Admitting: Family Medicine

## 2020-01-15 ENCOUNTER — Ambulatory Visit (INDEPENDENT_AMBULATORY_CARE_PROVIDER_SITE_OTHER): Payer: 59 | Admitting: Internal Medicine

## 2020-01-15 ENCOUNTER — Other Ambulatory Visit: Payer: Self-pay | Admitting: Family Medicine

## 2020-01-15 ENCOUNTER — Encounter: Payer: Self-pay | Admitting: Internal Medicine

## 2020-01-15 VITALS — BP 110/72 | HR 64 | Ht 69.0 in | Wt 196.0 lb

## 2020-01-15 DIAGNOSIS — K610 Anal abscess: Secondary | ICD-10-CM | POA: Diagnosis not present

## 2020-01-15 DIAGNOSIS — K5909 Other constipation: Secondary | ICD-10-CM | POA: Diagnosis not present

## 2020-01-15 DIAGNOSIS — Z860101 Personal history of adenomatous and serrated colon polyps: Secondary | ICD-10-CM

## 2020-01-15 DIAGNOSIS — R634 Abnormal weight loss: Secondary | ICD-10-CM

## 2020-01-15 DIAGNOSIS — R933 Abnormal findings on diagnostic imaging of other parts of digestive tract: Secondary | ICD-10-CM | POA: Diagnosis not present

## 2020-01-15 DIAGNOSIS — Z86010 Personal history of colonic polyps: Secondary | ICD-10-CM

## 2020-01-15 MED ORDER — PEG-KCL-NACL-NASULF-NA ASC-C 100 G PO SOLR: 1.0000 | ORAL | 0 refills | Status: DC

## 2020-01-15 NOTE — Progress Notes (Signed)
Timothy Page 64 y.o. 1955/11/21 [id number]  Assessment & Plan:   Encounter Diagnoses  Name Primary?  . Abnormal CT scan, colon Yes  . Other constipation   . Hx of adenomatous polyp of colon   . Perianal abscess - treated    Evaluate these problems with a colonoscopy.  No signs of malignancy anywhere but that is certainly in the differential and sometimes a perianal abscess can result related to her rectal tumor.  History of diminutive colon polyp which was an adenoma, actually with current guidelines that have evolved since I initially recommended a 5-year follow-up he is not outside the window really.  Weight loss is improved now that he is feeling better so that is reassuring.  His uncontrolled diabetes is likely contributing to this as well and has had has come under control his weight has improved also.  Further plans pending colonoscopy.  The risks and benefits as well as alternatives of endoscopic procedure(s) have been discussed and reviewed. All questions answered. The patient agrees to proceed.   I appreciate the opportunity to care for this patient. CC: McGowen, Adrian Blackwater, MD   Subjective:   Chief Complaint: Weight loss change in bowel habits with new constipation  HPI Timothy Page is a 64 year old white man known to me from prior colonoscopy 10 years ago with a 6 mm adenoma, who has had recent problems with a perianal abscess requiring incision and drainage twice by Dr. Georganna Skeans, and constipation which is new or at least significantly worse lately.  He has also lost weight as reflected below though it is starting to come back up.  He felt unwell previously with the perianal abscess which was treated in October and was not eating normally and attributes weight loss to that.  He denies any early satiety.  Long-term he says he has gone downhill ever since he had a thyroidectomy for Hurthle cell tumor 3 years ago.  Became somewhat constipated but that has been a lot worse  lately.  Prior to that he was having bowel movements every day.  He is also dealing with uncontrolled diabetes which is improved.  Now moving his bowels only about every 3 days, using Senokot-S daily Metamucil and lactulose as needed.  Wt Readings from Last 3 Encounters:  01/15/20 196 lb (88.9 kg)  12/23/19 191 lb 9.6 oz (86.9 kg)  12/02/19 194 lb (88 kg)  weight 231 lb 3.2 oz  03/15/19 Weight nadir was in the 180s  CT abdomen and pelvis with contrast IMPRESSION: Small peripherally enhancing fluid collection in the low in the pelvis adjacent to the anus likely representing a small perianal abscess. Correlation with the physical exam is recommended.  A few cm superior to this there is some asymmetric wall thickening which may be related incomplete distension although the possibility of a focal mass lesion could not be totally excluded. Correlation with the physical exam is recommended as well.  Mild inflammatory changes surrounding the pancreas which may represent some early pancreatitis. Clinical correlation is recommended with lipase values   Electronically Signed   By: Inez Catalina M.D.   On: 11/22/2019 17:43   Lipase normal   TSH was normal in September free T4 normal as well Hemoglobin A1c 9.3 in January of this year 12.8 in September.  Allergies  Allergen Reactions  . Atenolol     Severe "slowing"of functioning Severe "slowing"of functioning  . Hydroxyzine Other (See Comments)    "felt like I was coming out of my  skin"  . Tetracycline     Rash Because of a history of documented adverse serious drug reaction;Medi Alert bracelet  is recommended Rash Because of a history of documented adverse serious drug reaction;Medi Alert braceletis recommended  . Atorvastatin     D/Ced by him due to Myalgias in Sept 2014 D/Ced by him due to Myalgias in Sept 2014  . Codeine     Hallucinations. "I see pink elephants"  . Guaifenesin     Other reaction(s): Other (See  Comments) unknown unknown  . Mucinex [Guaifenesin Er] Other (See Comments)    Hallucinations "I see pink elephants"   Current Meds  Medication Sig  . amLODipine (NORVASC) 10 MG tablet TAKE 1 TABLET BY MOUTH  DAILY  . aspirin 81 MG tablet Take 81 mg by mouth daily.  . Blood Glucose Monitoring Suppl (ONE TOUCH ULTRA 2) w/Device KIT USE TO CHECK BLOOD SUGAR  ONCE DAILY  . cloNIDine (CATAPRES) 0.3 MG tablet TAKE 1 TABLET BY MOUTH EVERY NIGHT AT BEDTIME FOR HYPERTENSION  . glucose blood (ONETOUCH ULTRA) test strip USE TO CHECK BLOOD SUGAR  ONCE DAILY  . HYDROcodone-acetaminophen (NORCO/VICODIN) 5-325 MG tablet Take 1 tablet by mouth every 6 (six) hours as needed.  . insulin glargine (LANTUS SOLOSTAR) 100 UNIT/ML Solostar Pen 20 units SQ qhs. Patient will be up-titrating this med dose as needed to get glucoses into normal range. (Patient taking differently: Inject 32 Units into the skin daily. 20 units SQ qhs. Patient will be up-titrating this med dose as needed to get glucoses into normal range.)  . lactulose (CEPHULAC) 10 g packet 1 packet po qd for constipation  . lactulose (CHRONULAC) 10 GM/15ML solution 15 ml po tid as needed for constipation  . Lancets (ONETOUCH DELICA PLUS [id number]) MISC USE TO CHECK BLOOD SUGAR  ONCE DAILY  . levothyroxine (SYNTHROID) 137 MCG tablet TAKE 1 TABLET BY MOUTH  DAILY BEFORE BREAKFAST  . loratadine (CLARITIN) 10 MG tablet Take 10 mg by mouth daily as needed for allergies.  . metFORMIN (GLUCOPHAGE) 500 MG tablet TAKE 1 TABLET BY MOUTH  TWICE DAILY WITH MEALS  . mupirocin ointment (BACTROBAN) 2 % Apply 1 application topically 3 (three) times daily.  . ramipril (ALTACE) 10 MG capsule TAKE 1 CAPSULE BY MOUTH  TWICE DAILY  . senna-docusate (SENOKOT-S) 8.6-50 MG tablet Take 1 tablet by mouth 2 (two) times daily.   Past Medical History:  Diagnosis Date  . Arthritis   . Atypical chest pain 04/2017   ACS ruled out 05/11/17  . CAP (community acquired pneumonia)  05/07/2017  . Diabetes mellitus without complication ([place])    Poor control starting fall 2021-->GAD ab neg.  Insulin and C peptide levels wnl but on lower end of normal.  . 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 Hx of adenomatous polyp of colon 07/02/2009   As of 09/2016, pt is aware he is overdue for repeat colonoscopy---he knows to call [facility] to set this up.  . Hyperlipidemia    Intol of multiple statins and zetia. Advanced lipid clinic appt planned 2022.  [name] Hypertension   . Lipoma    back  . Perianal abscess 11/2019   I&D'd by Dr.Thompson, gen surg, in the ED 11/23/19  . 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 Cammy Copa  . Right ureteral calculus 04/2018   Cystoscopy with right retrograde pyelogram and right ureteral stent placement after stone removal.  . Seasonal allergies   .  Stroke ([place])    in 39R no complications   . Tachyarrhythmia 1980   age 56 X 2 over 4 month period.  No recurrence since that time.  . Thoracic aortic aneurysm ([place])    Incidentally discovered, 4.1 cm-->Dr. Cyndia Bent. Rpt CT 08/2018 and 08/2019 stable.  . Vocal cord paralysis 07/2016   Left vocal cord paralysis noted after thyroid surgery.   Past Surgical History:  Procedure Laterality Date  . CARDIAC CATHETERIZATION  1980  . COLONOSCOPY W/ POLYPECTOMY  07/02/2009   tubular adenoma (recall 5 yrs; pt aware he is overdue as of 09/2016 and knows to call [facility] to set this up.  . CYSTOSCOPY WITH RETROGRADE PYELOGRAM, URETEROSCOPY AND STENT PLACEMENT Right 05/09/2018   Procedure: CYSTOSCOPY WITH RIGHT RETROGRADE RIGHT URETEROSCOPY STONE EXTRACTION RIGHT STENT EXCHANGE;  Surgeon: Lucas Mallow, MD;  [facility];  Service: Urology;  Laterality: Right;  . CYSTOSCOPY/RETROGRADE/URETEROSCOPY/STONE EXTRACTION WITH BASKET Right 04/30/2018   Procedure: CYSTOSCOPY/RETROGRADE/RIGHT URETEROSCOPY/;  Surgeon: Lucas Mallow, MD;  Location:  [facility];  Service: Urology;  Laterality: Right;  . ESOPHAGOGASTRODUODENOSCOPY  07/02/2009   NORMAL  . EYE SURGERY    . finger amputaion     with reattachement  . INCISION AND DRAINAGE ABSCESS ANAL  11/23/2019   gen surg (In ED).  [name] LASIK    . THYROIDECTOMY N/A 08/03/2016   Procedure: Total THYROIDECTOMY;  Surgeon: [name], MD;  Location: [facility];  Service: ENT;  Laterality: N/A;  Total Thyroidectomy    Social History   Social History Narrative   Married, 2 daughters, 1 grandson that lives with him.   Educ: some college   Occup: retired from the [employer].   No T/A/Ds.   family history includes Arthritis in his father and mother; Brain cancer in his mother; COPD in his mother; Cancer in his father, mother, and paternal grandmother; Diabetes in his father, mother, and paternal grandmother; Heart disease in his father, maternal grandfather, maternal grandmother, mother, and paternal grandfather; Hyperlipidemia in his father and mother; Hypertension in his father and mother; Mental illness in his father; Thyroid cancer in his maternal aunt.   Review of Systems As per HPI some muscle cramps at times all other review of systems negative Objective:   Physical Exam BP 110/72   Pulse 64   Ht '5\' 9"'  (1.753 m)   Wt 196 lb (88.9 kg)   BMI 28.94 kg/m  Well-developed well-nourished white man in no acute distress Eyes are anicteric There is a neck scar where he had thyroidectomy The lungs are clear except for a rare expiratory slight wheeze with good air movement The heart sounds show a normal S1-S2 no rubs murmurs or gallops The abdomen is soft and nontender without hepatosplenomegaly or mass bowel sounds are present, I do not detect a hernia.  Rectal exam is deferred until colonoscopy.  He is alert and oriented x3.  Data reviewed includes primary care notes, perianal abscess incision and drainage op note, CT scan as above labs in the EMR from this year.

## 2020-01-15 NOTE — Patient Instructions (Signed)
You have been scheduled for a colonoscopy. Please follow written instructions given to you at your visit today.  Please pick up your prep supplies at the pharmacy within the next 1-3 days. If you use inhalers (even only as needed), please bring them with you on the day of your procedure.   Due to recent changes in healthcare laws, you may see the results of your imaging and laboratory studies on MyChart before your provider has had a chance to review them.  We understand that in some cases there may be results that are confusing or concerning to you. Not all laboratory results come back in the same time frame and the provider may be waiting for multiple results in order to interpret others.  Please give us 48 hours in order for your provider to thoroughly review all the results before contacting the office for clarification of your results.    I appreciate the opportunity to care for you. Carl Gessner, MD, FACG 

## 2020-01-16 ENCOUNTER — Telehealth: Payer: Self-pay

## 2020-01-16 NOTE — Telephone Encounter (Signed)
Please review in PCP absence.  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 Continue to titrate lantus by 2U q2-3d to get fasting gluc's consistently 100-110 range. Continue metformin 500 mg bid. Next a1c approx 01/29/20". His next appointment is scheduled for 12/15.

## 2020-01-16 NOTE — Telephone Encounter (Signed)
Patient's wife advised of recommendations. She was concerned that insurance may deny early refill of insulin due to current sig directions but will call pharmacy to clarify and let us know.

## 2020-01-16 NOTE — Telephone Encounter (Signed)
Patient's Wife, Precious Bard, called and stated the patient is titrating up on his insulin dosage and is currently at 32 units.  Patient does not follow up with Dr. Anitra Lauth until 12/15.  His blood sugars have at times been running up to 200's and she is thinking he needs an additional medication to help lower blood sugars.  She is wondering what needs to be done between now and the appointment date.  Precious Bard can be reached at [phone].  Please leave her a message on her voice mail if she is unavailable at time of call.

## 2020-01-16 NOTE — Telephone Encounter (Signed)
Reviewed PCPs note in his absence- Patient was started to titrate up on his Lantus dosage every 2 to 3 days until his fasting morning blood glucoses are between 100-110.  The insulin dose is based on a fasting blood glucoses only. At the time of his appointment he was on 16 units of insulin and it sounds that he has titrated up to 32 units by phone note.  -  If his fasting glucoses are not below 110, then continue to increase by 1 unit every 2 to 3 days.   -  If he is having fasting glucoses below 110 stay on 32 units until seen by Dr. Anitra Lauth.  Would also encourage diabetic diet low in sugar and carbohydrates.

## 2020-01-22 ENCOUNTER — Other Ambulatory Visit: Payer: Self-pay | Admitting: Internal Medicine

## 2020-01-22 LAB — SARS CORONAVIRUS 2 (TAT 6-24 HRS): SARS Coronavirus 2: NEGATIVE

## 2020-01-24 ENCOUNTER — Other Ambulatory Visit: Payer: Self-pay

## 2020-01-24 ENCOUNTER — Encounter: Payer: Self-pay | Admitting: Internal Medicine

## 2020-01-24 ENCOUNTER — Ambulatory Visit (AMBULATORY_SURGERY_CENTER): Payer: 59 | Admitting: Internal Medicine

## 2020-01-24 VITALS — BP 169/81 | HR 62 | Temp 97.3°F | Resp 14 | Ht 69.0 in | Wt 196.0 lb

## 2020-01-24 DIAGNOSIS — R933 Abnormal findings on diagnostic imaging of other parts of digestive tract: Secondary | ICD-10-CM | POA: Diagnosis not present

## 2020-01-24 DIAGNOSIS — D123 Benign neoplasm of transverse colon: Secondary | ICD-10-CM

## 2020-01-24 DIAGNOSIS — K5730 Diverticulosis of large intestine without perforation or abscess without bleeding: Secondary | ICD-10-CM

## 2020-01-24 DIAGNOSIS — D122 Benign neoplasm of ascending colon: Secondary | ICD-10-CM

## 2020-01-24 DIAGNOSIS — Z86010 Personal history of colonic polyps: Secondary | ICD-10-CM

## 2020-01-24 DIAGNOSIS — D124 Benign neoplasm of descending colon: Secondary | ICD-10-CM

## 2020-01-24 DIAGNOSIS — K5909 Other constipation: Secondary | ICD-10-CM

## 2020-01-24 MED ORDER — SODIUM CHLORIDE 0.9 % IV SOLN: 500.0000 mL | Freq: Once | INTRAVENOUS | Status: DC

## 2020-01-24 NOTE — Patient Instructions (Addendum)
I found and removed 3 tiny polyps.  All else looks okay.  No signs of abscess at this time hopefully that never happens again.  I will let you know pathology results and when to have another routine colonoscopy by mail and/or My Chart.  I appreciate the opportunity to care for you. Gatha Mayer, MD, FACG YOU HAD AN ENDOSCOPIC PROCEDURE TODAY AT [facility]:   Refer to the procedure report that was given to you for any specific questions about what was found during the examination.  If the procedure report does not answer your questions, please call your gastroenterologist to clarify.  If you requested that your care partner not be given the details of your procedure findings, then the procedure report has been included in a sealed envelope for you to review at your convenience later.  YOU SHOULD EXPECT: Some feelings of bloating in the abdomen. Passage of more gas than usual.  Walking can help get rid of the air that was put into your GI tract during the procedure and reduce the bloating. If you had a lower endoscopy (such as a colonoscopy or flexible sigmoidoscopy) you may notice spotting of blood in your stool or on the toilet paper. If you underwent a bowel prep for your procedure, you may not have a normal bowel movement for a few days.  Please Note:  You might notice some irritation and congestion in your nose or some drainage.  This is from the oxygen used during your procedure.  There is no need for concern and it should clear up in a day or so.  SYMPTOMS TO REPORT IMMEDIATELY:   Following lower endoscopy (colonoscopy or flexible sigmoidoscopy):  Excessive amounts of blood in the stool  Significant tenderness or worsening of abdominal pains  Swelling of the abdomen that is new, acute  Fever of 100F or higher   For urgent or emergent issues, a gastroenterologist can be reached at any hour by calling [phone]. Do not use MyChart messaging for urgent concerns.     DIET:  We do recommend a small meal at first, but then you may proceed to your regular diet.  Drink plenty of fluids but you should avoid alcoholic beverages for 24 hours.  MEDICATIONS: Continue present medications.  Please see handouts given to you by your recovery nurse.  ACTIVITY:  You should plan to take it easy for the rest of today and you should NOT DRIVE or use heavy machinery until tomorrow (because of the sedation medicines used during the test).    FOLLOW UP: Our staff will call the number listed on your records 48-72 hours following your procedure to check on you and address any questions or concerns that you may have regarding the information given to you following your procedure. If we do not reach you, we will leave a message.  We will attempt to reach you two times.  During this call, we will ask if you have developed any symptoms of COVID 19. If you develop any symptoms (ie: fever, flu-like symptoms, shortness of breath, cough etc.) before then, please call [phone].  If you test positive for Covid 19 in the 2 weeks post procedure, please call and report this information to [place].    If any biopsies were taken you will be contacted by phone or by letter within the next 1-3 weeks.  Please call us at [phone] if you have not heard about the biopsies in 3 weeks.   Thank  you for allowing [place] to provide for your healthcare needs today.   SIGNATURES/CONFIDENTIALITY: You and/or your care partner have signed paperwork which will be entered into your electronic medical record.  These signatures attest to the fact that that the information above on your After Visit Summary has been reviewed and is understood.  Full responsibility of the confidentiality of this discharge information lies with you and/or your care-partner.

## 2020-01-24 NOTE — Op Note (Signed)
[place] Patient Name: Timothy Page Procedure Date: 01/24/2020 4:26 PM MRN: [id number] Endoscopist: Gatha Mayer , MD Age: 64 Referring MD:  Date of Birth: 1955-08-21 Gender: Male Account #: [id number] Procedure:                Colonoscopy Indications:              Abnormal CT of the GI tract Medicines:                Propofol per Anesthesia, Monitored Anesthesia Care Procedure:                Pre-Anesthesia Assessment:                           - Prior to the procedure, a History and Physical                            was performed, and patient medications and                            allergies were reviewed. The patient's tolerance of                            previous anesthesia was also reviewed. The risks                            and benefits of the procedure and the sedation                            options and risks were discussed with the patient.                            All questions were answered, and informed consent                            was obtained. Prior Anticoagulants: The patient has                            taken no previous anticoagulant or antiplatelet                            agents. ASA Grade Assessment: II - A patient with                            mild systemic disease. After reviewing the risks                            and benefits, the patient was deemed in                            satisfactory condition to undergo the procedure.                           After obtaining informed consent, the colonoscope  was passed under direct vision. Throughout the                            procedure, the patient's blood pressure, pulse, and                            oxygen saturations were monitored continuously. The                            Colonoscope was introduced through the anus and                            advanced to the the cecum, identified by                            appendiceal orifice  and ileocecal valve. The                            colonoscopy was performed without difficulty. The                            patient tolerated the procedure well. The quality                            of the bowel preparation was adequate. The                            ileocecal valve, appendiceal orifice, and rectum                            were photographed. The bowel preparation used was                            MoviPrep via split dose instruction. Scope In: 4:39:57 PM Scope Out: 4:57:50 PM Scope Withdrawal Time: 0 hours 10 minutes 35 seconds  Total Procedure Duration: 0 hours 17 minutes 53 seconds  Findings:                 The perianal and digital rectal examinations were                            normal. Pertinent negatives include normal prostate                            (size, shape, and consistency).                           Three sessile polyps were found in the descending                            colon, transverse colon and ascending colon. The                            polyps were diminutive in size. These polyps were  removed with a cold snare. Resection and retrieval                            were complete. Verification of patient                            identification for the specimen was done. Estimated                            blood loss was minimal.                           Multiple diverticula were found in the sigmoid                            colon.                           The exam was otherwise without abnormality on                            direct and retroflexion views. Complications:            No immediate complications. Estimated Blood Loss:     Estimated blood loss was minimal. Estimated blood                            loss was minimal. Impression:               - Three diminutive polyps in the descending colon,                            in the transverse colon and in the ascending colon,                             removed with a cold snare. Resected and retrieved.                           - The examination was otherwise normal on direct                            and retroflexion views.                           - Personal history of colonic polyp diminutive                            adenoma 2011. Recommendation:           - Patient has a contact number available for                            emergencies. The signs and symptoms of potential                            delayed complications were discussed with the  patient. Return to normal activities tomorrow.                            Written discharge instructions were provided to the                            patient.                           - Resume previous diet.                           - Continue present medications.                           - Repeat colonoscopy is recommended for                            surveillance. The colonoscopy date will be                            determined after pathology results from today's                            exam become available for review. Gatha Mayer, MD 01/24/2020 5:09:02 PM This report has been signed electronically.

## 2020-01-24 NOTE — Progress Notes (Signed)
VS- Timothy Page  Last used snuff 700 am 01-24-20

## 2020-01-24 NOTE — Progress Notes (Signed)
Called to room to assist during endoscopic procedure.  Patient ID and intended procedure confirmed with present staff. Received instructions for my participation in the procedure from the performing physician.  

## 2020-01-24 NOTE — Progress Notes (Signed)
A/ox3, pleased with MAC, report to RN 

## 2020-01-28 ENCOUNTER — Telehealth: Payer: Self-pay

## 2020-01-28 NOTE — Telephone Encounter (Signed)
Attempted to contact patient, left message regarding follow up of procedure. LN 

## 2020-01-28 NOTE — Telephone Encounter (Signed)
  Follow up Call-  Call back number 01/24/2020  Post procedure Call Back phone  # [phone]  Permission to leave phone message Yes  Some recent data might be hidden     Patient questions:  Do you have a fever, pain , or abdominal swelling? No. Pain Score  0 *  Have you tolerated food without any problems? Yes.    Have you been able to return to your normal activities? Yes.    Do you have any questions about your discharge instructions: Diet   No. Medications  No. Follow up visit  No.  Do you have questions or concerns about your Care? No.  Actions: * If pain score is 4 or above: No action needed, pain <4.  1. Have you developed a fever since your procedure? no  2.   Have you had an respiratory symptoms (SOB or cough) since your procedure? no  3.   Have you tested positive for COVID 19 since your procedure no  4.   Have you had any family members/close contacts diagnosed with the COVID 19 since your procedure?  no   If yes to any of these questions please route to Joylene John, RN and Joella Prince, RN

## 2020-01-29 ENCOUNTER — Telehealth: Payer: Self-pay

## 2020-01-29 ENCOUNTER — Encounter: Payer: Self-pay | Admitting: Family Medicine

## 2020-01-29 ENCOUNTER — Ambulatory Visit (INDEPENDENT_AMBULATORY_CARE_PROVIDER_SITE_OTHER): Payer: 59 | Admitting: Family Medicine

## 2020-01-29 ENCOUNTER — Other Ambulatory Visit: Payer: Self-pay

## 2020-01-29 VITALS — BP 110/73 | HR 64 | Temp 97.7°F | Resp 16 | Ht 69.0 in | Wt 197.2 lb

## 2020-01-29 DIAGNOSIS — E139 Other specified diabetes mellitus without complications: Secondary | ICD-10-CM

## 2020-01-29 DIAGNOSIS — I10 Essential (primary) hypertension: Secondary | ICD-10-CM

## 2020-01-29 DIAGNOSIS — K5909 Other constipation: Secondary | ICD-10-CM

## 2020-01-29 DIAGNOSIS — R634 Abnormal weight loss: Secondary | ICD-10-CM | POA: Diagnosis not present

## 2020-01-29 LAB — BASIC METABOLIC PANEL
BUN: 21 mg/dL (ref 6–23)
CO2: 25 mEq/L (ref 19–32)
Calcium: 9.4 mg/dL (ref 8.4–10.5)
Chloride: 107 mEq/L (ref 96–112)
Creatinine, Ser: 0.92 mg/dL (ref 0.40–1.50)
GFR: 88.75 mL/min (ref >60.00)
Glucose, Bld: 110 mg/dL — ABNORMAL HIGH (ref 70–99)
Potassium: 4.4 mEq/L (ref 3.5–5.1)
Sodium: 140 mEq/L (ref 135–145)

## 2020-01-29 LAB — HEMOGLOBIN A1C: Hgb A1c MFr Bld: 8.5 % — ABNORMAL HIGH (ref 4.6–6.5)

## 2020-01-29 MED ORDER — LANTUS SOLOSTAR 100 UNIT/ML ~~LOC~~ SOPN: PEN_INJECTOR | SUBCUTANEOUS | 3 refills | Status: DC

## 2020-01-29 NOTE — Telephone Encounter (Signed)
Spoke with patient and let him know that the dose has changed on the box and to make sure he is just taking the 32 units.  We are doing this so  he can get more.

## 2020-01-29 NOTE — Telephone Encounter (Signed)
Per PCP request, "Pls call Timothy Page and see how we need to do his lantus insulin rx to get insurance to allow more to be dispensed. He filled it approx a week ago and says they would only give him 2 pens, says ins was pushing back for some reason, etc"

## 2020-01-29 NOTE — Telephone Encounter (Signed)
Left message on machine for patient to call back.

## 2020-01-29 NOTE — Telephone Encounter (Signed)
Each pen is 500 units.  So each pen for him at 32 units is about 15 days per pen.  Since he is titrating up, can possibly put a a max dose on there so they will give him more.  A whole box of pens which is 5 pens will be 1500 hundred units.

## 2020-01-29 NOTE — Progress Notes (Signed)
OFFICE VISIT  01/29/2020  CC:  Chief Complaint  Patient presents with  . Follow-up    constipation    HPI:    Patient is a 64 y.o. 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 Continue to titrate lantus by 2U q2-3d to get fasting gluc's consistently 100-110 range. Continue metformin 500 mg bid. Next a1c approx 01/29/20.  2) Chronic constipation: add amitiza 55mcg, 1 bid. Continue senakot S and miralax---cont to titrate miralax for desired effect.  3) Perianal abscess, recurrent: s/p I and D again recently. Healing fine per pt, has gen surg f/u later this week.  4) Abnl wt loss: stabilized overall, but still concerning b/c no cause identified at this time. Has GI eval for ? Suspicious area on colon on CT last month while at ED for eval of peri anal abscess (plus he's due for 10 yr repeat colonoscopy anyway). No labs or sx's to suggest ominous etiology at this time. Possibly insulin-deficiency state contributing, and we may see wt stabilize as we rectify this with supplemental insulin that we've pretty recently started."  INTERIM HX: Dr. Carlean Purl saw did colonoscopy 12/10 and it showed 3 small polyps and path is still pending at this time, o/w some diverticulosis.  Feeling ok, says he is very relieved that his colonoscopy showed no cancer. Wt is stable---actually up 1.2 lbs in last couple weeks. He is frustrated with his glucose variations. Says no rhyme or reason to his glucose trends, not correlating with diet/activity level, some days down into 130s, sometimes 100 points higher.  Has titrated hp to 32 U lantus daily, lately split it into 2 equal doses and feels like this has helped even things out a little.  Constipation: still with avg of 3d between BMs, hard to pass/painful, taking senakot daily and 1 capful miralax night.  No blood in stool.    No problems with anal/rectal pain  since last I&D of perirectal abscess.  ROS: no fevers, no CP, no SOB, no wheezing, no cough, no dizziness, no HAs, no rashes, no melena/hematochezia.  No polyuria or polydipsia.  No myalgias or arthralgias.  No focal weakness, paresthesias, or tremors.  No acute vision or hearing abnormalities. No n/v/d or abd pain.  No palpitations.     Past Medical History:  Diagnosis Date  . Allergy   . Arthritis   . Atypical chest pain 04/2017   ACS ruled out 05/11/17  . CAP (community acquired pneumonia) 05/07/2017  . Chronic kidney disease    kidney stones  . Diabetes mellitus without complication ([place])    Poor control starting fall 2021-->GAD ab neg.  Insulin and C peptide levels wnl but on lower end of normal.  . 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 Hx of adenomatous polyp of colon 07/02/2009   As of 09/2016, pt is aware he is overdue for repeat colonoscopy---he knows to call [facility] to set this up.  . Hyperlipidemia    Intol of multiple statins and zetia. Advanced lipid clinic appt planned 2022.  [name] Hypertension   . Lipoma    back  . Myocardial infarction ([place])    MI at age 47  . Perianal abscess 11/2019   I&D'd by Dr.Thompson, gen surg, in the ED 11/23/19  . 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 Cammy Copa  . Right ureteral calculus  04/2018   Cystoscopy with right retrograde pyelogram and right ureteral stent placement after stone removal.  . Seasonal allergies   . Stroke ([place])    in 18H no complications   . Tachyarrhythmia 1980   age 73 X 2 over 31 month period.  No recurrence since that time.  . Thoracic aortic aneurysm ([place])    Incidentally discovered, 4.1 cm-->Dr. Cyndia Bent. Rpt CT 08/2018 and 08/2019 stable.  . Vocal cord paralysis 07/2016   Left vocal cord paralysis noted after thyroid surgery.    Past Surgical History:  Procedure Laterality Date  . CARDIAC CATHETERIZATION  1980  . COLONOSCOPY W/  POLYPECTOMY  07/02/2009; 01/24/20   2011 adenoma.  2021 path pending  . CYSTOSCOPY WITH RETROGRADE PYELOGRAM, URETEROSCOPY AND STENT PLACEMENT Right 05/09/2018   Procedure: CYSTOSCOPY WITH RIGHT RETROGRADE RIGHT URETEROSCOPY STONE EXTRACTION RIGHT STENT EXCHANGE;  Surgeon: Lucas Mallow, MD;  Location: [facility];  Service: Urology;  Laterality: Right;  . CYSTOSCOPY/RETROGRADE/URETEROSCOPY/STONE EXTRACTION WITH BASKET Right 04/30/2018   Procedure: CYSTOSCOPY/RETROGRADE/RIGHT URETEROSCOPY/;  Surgeon: Lucas Mallow, MD;  Location: [facility];  Service: Urology;  Laterality: Right;  . ESOPHAGOGASTRODUODENOSCOPY  07/02/2009   NORMAL  . EYE SURGERY    . finger amputaion     with reattachement  . INCISION AND DRAINAGE ABSCESS ANAL  11/23/2019   gen surg (In ED).  [name] LASIK    . THYROIDECTOMY N/A 08/03/2016   Procedure: Total THYROIDECTOMY;  Surgeon: [name], MD;  Location: [facility];  Service: ENT;  Laterality: N/A;  Total Thyroidectomy   . UPPER GASTROINTESTINAL ENDOSCOPY      Outpatient Medications Prior to Visit  Medication Sig Dispense Refill  . amLODipine (NORVASC) 10 MG tablet TAKE 1 TABLET BY MOUTH  DAILY 90 tablet 1  . aspirin 81 MG tablet Take 81 mg by mouth daily.    . Blood Glucose Monitoring Suppl (ONE TOUCH ULTRA 2) w/Device KIT USE TO CHECK BLOOD SUGAR  ONCE DAILY 1 kit 0  . cloNIDine (CATAPRES) 0.3 MG tablet TAKE 1 TABLET BY MOUTH EVERY NIGHT AT BEDTIME FOR HYPERTENSION 90 tablet 0  . glucose blood (ONETOUCH ULTRA) test strip USE TO CHECK BLOOD SUGAR  ONCE DAILY 100 strip 3  . Lancets (ONETOUCH DELICA PLUS [id number]) MISC USE TO CHECK BLOOD SUGAR  ONCE DAILY 100 each 3  . levothyroxine (SYNTHROID) 137 MCG tablet TAKE 1 TABLET BY MOUTH  DAILY BEFORE BREAKFAST 90 tablet 3  . loratadine (CLARITIN) 10 MG tablet Take 10 mg by mouth daily as needed for allergies.    . metFORMIN (GLUCOPHAGE) 500 MG tablet TAKE 1 TABLET BY MOUTH  TWICE DAILY WITH MEALS 180 tablet 3  . mupirocin ointment  (BACTROBAN) 2 % Apply 1 application topically 3 (three) times daily. 15 g 1  . ramipril (ALTACE) 10 MG capsule TAKE 1 CAPSULE BY MOUTH  TWICE DAILY 180 capsule 1  . senna-docusate (SENOKOT-S) 8.6-50 MG tablet Take 1 tablet by mouth 2 (two) times daily. 60 tablet 0  . HYDROcodone-acetaminophen (NORCO/VICODIN) 5-325 MG tablet Take 1 tablet by mouth every 6 (six) hours as needed. 6 tablet 0  . insulin glargine (LANTUS SOLOSTAR) 100 UNIT/ML Solostar Pen 20 units SQ qhs. Patient will be up-titrating this med dose as needed to get glucoses into normal range. (Patient taking differently: 20 units SQ qhs. Patient will be up-titrating this med dose as needed to get glucoses into normal range.) 15 mL 3  . lactulose (CEPHULAC) 10 g packet 1 packet po  qd for constipation 30 each 6  . lactulose (CHRONULAC) 10 GM/15ML solution 15 ml po tid as needed for constipation 236 mL 1   No facility-administered medications prior to visit.    Allergies  Allergen Reactions  . Atenolol     Severe "slowing"of functioning Severe "slowing"of functioning  . Hydroxyzine Other (See Comments)    "felt like I was coming out of my skin"  . Tetracycline     Rash Because of a history of documented adverse serious drug reaction;Medi Alert bracelet  is recommended Rash Because of a history of documented adverse serious drug reaction;Medi Alert braceletis recommended  . Atorvastatin     D/Ced by him due to Myalgias in Sept 2014 D/Ced by him due to Myalgias in Sept 2014  . Codeine     Hallucinations. "I see pink elephants"  . Guaifenesin     Other reaction(s): Other (See Comments) unknown unknown  . Mucinex [Guaifenesin Er] Other (See Comments)    Hallucinations "I see pink elephants"    ROS As per HPI  PE: Vitals with BMI 01/29/2020 01/24/2020 01/24/2020  Height _0  - -  Weight 197 lbs 3 oz - -  BMI 17.71 - -  Systolic 165 790 383  Diastolic 73 81 89  Pulse 64 62 58     Gen: Alert, well appearing.   Patient is oriented to person, place, time, and situation. AFFECT: pleasant, lucid thought and speech. No further exam today.  LABS:  Lab Results  Component Value Date   TSH 1.35 10/30/2019   Lab Results  Component Value Date   WBC 6.3 11/23/2019   HGB 13.3 11/23/2019   HCT 38.5 (L) 11/23/2019   MCV 91.2 11/23/2019   PLT 252 11/23/2019   Lab Results  Component Value Date   CREATININE 0.98 11/23/2019   BUN 12 11/23/2019   NA 137 11/23/2019   K 4.3 11/23/2019   CL 104 11/23/2019   CO2 22 11/23/2019   Lab Results  Component Value Date   ALT 17 11/23/2019   AST 22 11/23/2019   ALKPHOS 76 11/23/2019   BILITOT 1.3 (H) 11/23/2019   Lab Results  Component Value Date   CHOL 176 10/30/2019   Lab Results  Component Value Date   HDL 31.00 (L) 10/30/2019   Lab Results  Component Value Date   LDLCALC 125 (H) 03/15/2019   Lab Results  Component Value Date   TRIG 208.0 (H) 10/30/2019   Lab Results  Component Value Date   CHOLHDL 6 10/30/2019   Lab Results  Component Value Date   PSA 0.73 10/30/2019   PSA 1.68 06/19/2018   PSA 1.99 04/30/2018   Lab Results  Component Value Date   HGBA1C 12.8 Repeated and verified X2. (H) 10/30/2019   Lab Results  Component Value Date   ESRSEDRATE 15 11/22/2019   Lab Results  Component Value Date   CRP <1.0 11/22/2019    IMPRESSION AND PLAN:  1) Abnormal wt loss: this has now started to level out and go back up. Normal lab eval, recent reassuring colonoscopy.  Pt very relieved. Question whether this wt loss was due to metabolic changes from poorly controlled DM---type "1 vs 1.5". Cont slow titrate lantus and cont metformin 500 mg bid, ask Dr. Dwyane Dee to see him for this when he goes for f/u thyroid in Jan 2022.   Hba1c and bmet today.  2) Chronic constipation: gradually improving. Inc miralax to 1 cap bid, cont senakot.  3)  Perirectal abscess: hx of I&D x 2 in the last couple months---doing well now. Unclear etiology.   Colonoscopy 5 d/a w/out significantly concerning abnormality.  4) HTN: well controlled. Cont amlod 10 qd, ramipril 10 bid, clonidine 0.20m qhs. Lytes/cr today.  An After Visit Summary was printed and given to the patient.  FOLLOW UP: Return in about 3 months (around 04/28/2020) for routine chronic illness f/u.  Signed:  PCrissie Sickles MD           01/29/2020

## 2020-01-29 NOTE — Telephone Encounter (Signed)
OK. I just sent in a new lantus rx with sig "give up to 65 U SQ daily" so hopefully this will work to get him more pens. Pls make pt aware that the sig will look like this but he is still to take what he is taking now (32U) and gradually increase like we talked about.-thx

## 2020-02-03 ENCOUNTER — Other Ambulatory Visit: Payer: Self-pay

## 2020-02-03 ENCOUNTER — Telehealth: Payer: Self-pay | Admitting: Family Medicine

## 2020-02-03 MED ORDER — LANTUS SOLOSTAR 100 UNIT/ML ~~LOC~~ SOPN: PEN_INJECTOR | SUBCUTANEOUS | 3 refills | Status: DC

## 2020-02-03 NOTE — Telephone Encounter (Signed)
New Rx submitted to pharmacy, patient's wife notified(DPR).

## 2020-02-03 NOTE — Telephone Encounter (Signed)
Patient states pharmacy telling them the RX for Lantus Solostar with new sig has not been receieved yet. Please send to patient's new pharmacy: [facility], [place].

## 2020-02-04 ENCOUNTER — Encounter: Payer: Self-pay | Admitting: Internal Medicine

## 2020-02-05 ENCOUNTER — Other Ambulatory Visit: Payer: Self-pay | Admitting: Family Medicine

## 2020-02-15 DIAGNOSIS — K8681 Exocrine pancreatic insufficiency: Secondary | ICD-10-CM

## 2020-02-15 DIAGNOSIS — I81 Portal vein thrombosis: Secondary | ICD-10-CM

## 2020-02-15 HISTORY — DX: Exocrine pancreatic insufficiency: K86.81

## 2020-02-15 HISTORY — DX: Portal vein thrombosis: I81

## 2020-03-06 ENCOUNTER — Other Ambulatory Visit: Payer: 59

## 2020-03-10 ENCOUNTER — Other Ambulatory Visit: Payer: Self-pay | Admitting: Endocrinology

## 2020-03-10 ENCOUNTER — Other Ambulatory Visit: Payer: Self-pay

## 2020-03-11 ENCOUNTER — Telehealth: Payer: Self-pay | Admitting: Family Medicine

## 2020-03-11 ENCOUNTER — Other Ambulatory Visit: Payer: Self-pay

## 2020-03-11 ENCOUNTER — Other Ambulatory Visit: Payer: Self-pay | Admitting: Endocrinology

## 2020-03-11 MED ORDER — CLONIDINE HCL 0.3 MG PO TABS: ORAL_TABLET | ORAL | 1 refills | Status: DC

## 2020-03-11 NOTE — Telephone Encounter (Signed)
Rx sent 

## 2020-03-11 NOTE — Telephone Encounter (Signed)
Patient's wife called to request refill of clonidine. Please send to new pharmacy: [facility], [place].

## 2020-03-12 ENCOUNTER — Other Ambulatory Visit: Payer: 59

## 2020-03-13 ENCOUNTER — Ambulatory Visit: Payer: 59 | Admitting: Endocrinology

## 2020-03-31 ENCOUNTER — Telehealth: Payer: Self-pay | Admitting: Endocrinology

## 2020-03-31 NOTE — Telephone Encounter (Signed)
Pt's wife Precious Bard called because PCP was supposed to send over information for pt's diabetic care and she wanted to make sure Dr Dwyane Dee received it. She also wanted to make sure the lab orders that are in for this week are also to go along with his diabetic care because this will be his first appt regarding his diabetes.  Patti requests to be called about this at [phone].

## 2020-04-02 ENCOUNTER — Other Ambulatory Visit (INDEPENDENT_AMBULATORY_CARE_PROVIDER_SITE_OTHER): Payer: 59

## 2020-04-02 ENCOUNTER — Other Ambulatory Visit: Payer: Self-pay

## 2020-04-02 ENCOUNTER — Other Ambulatory Visit: Payer: Self-pay | Admitting: Endocrinology

## 2020-04-02 DIAGNOSIS — E890 Postprocedural hypothyroidism: Secondary | ICD-10-CM

## 2020-04-02 DIAGNOSIS — E1165 Type 2 diabetes mellitus with hyperglycemia: Secondary | ICD-10-CM

## 2020-04-02 LAB — GLUCOSE, RANDOM: Glucose, Bld: 103 mg/dL — ABNORMAL HIGH (ref 70–99)

## 2020-04-02 LAB — TSH: TSH: 1.13 u[IU]/mL (ref 0.35–4.50)

## 2020-04-02 LAB — T4, FREE: Free T4: 1.17 ng/dL (ref 0.60–1.60)

## 2020-04-02 NOTE — Progress Notes (Signed)
Patient ID: Timothy Page, male   DOB: 1955-06-15, 65 y.o.   MRN: [id number]            Reason for Appointment: Endocrinology visit, diabetes consultation    History of Present Illness:     DIABETES diagnosis date:  2014  Previous history: He had been on Metformin for most of the duration of the diabetes A1c had been usually between 6.2 and 7.2 until 08/2018  Recent history:      His blood sugars appear to be out of control since at least 02/2019 his A1c went up to 9.3  He was tried on higher doses of Metformin but this caused diarrhea  In 9/21 with his blood sugars being markedly increased an A1c of 12.8 he was started on basal insulin  Baseline glucose was 362  He has titrated the Lantus up to about 38 units but he is concerned about his blood sugars being inconsistent  He may feel a little shaky when the blood sugars are below 100 including today when his blood sugar was 89, at this point he was feeling significantly shaky  He has recently gained a lot of weight  Not consistently active because of fatigue especially in the afternoon  Has not had any nutritional counseling; does avoid sweet drinks  Non-insulin hypoglycemic drugs: Metformin 500 mg twice daily     Insulin regimen:    Lantus 38 units daily         Side effects from medications:  Diarrhea from high-dose Metformin      Monitors blood glucose: Once a day.    Glucometer: One Prob[employer].           Blood Glucose readings from patient record  FASTING blood sugar range 79-228   Hypoglycemia:  As above              Previous nutritional counseling: None  Weight control:  Wt Readings from Last 3 Encounters:  04/03/20 201 lb 6.4 oz (91.4 kg)  01/29/20 197 lb 3.2 oz (89.4 kg)  01/24/20 196 lb (88.9 kg)   Lab Results  Component Value Date   HGBA1C 8.5 (H) 01/29/2020   HGBA1C 12.8 Repeated and verified X2. 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  Patient was discharged on 150 g of levothyroxine However since TSH was 0.1 this was reduced down to 137 g on his initial consultation in 7/18  RECENT history:  He says he tends to get tired in the afternoon Has had chronic cold intolerance  Weight is fluctuating slightly  He is very consistent with taking his levothyroxine before breakfast daily with water Not on any vitamins or calcium supplements at the same time  TSH is normal consistently now  Wt Readings from Last 3 Encounters:  04/03/20 201 lb 6.4 oz (91.4 kg)  01/29/20 197 lb 3.2 oz (89.4 kg)  01/24/20 196 lb (88.9 kg)  Lab Results  Component Value Date   TSH 1.13 04/02/2020   TSH 1.35 10/30/2019   TSH 3.63 03/01/2019   FREET4 1.17 04/02/2020   FREET4 1.26 10/30/2019   FREET4 1.20 03/01/2019     Allergies as of 04/03/2020      Reactions   Atenolol    Severe "slowing"of functioning Severe "slowing"of functioning   Hydroxyzine Other (See Comments)   "felt like I was coming out of my skin"   Tetracycline    Rash Because of a history of documented adverse serious drug reaction;Medi Alert bracelet  is recommended Rash Because of a history of documented adverse serious drug reaction;Medi Alert braceletis recommended   Atorvastatin    D/Ced by him due to Myalgias in Sept 2014 D/Ced by him due to Myalgias in Sept 2014   Codeine    Hallucinations. "I see pink elephants"   Guaifenesin    Other reaction(s):  Other (See Comments) unknown unknown   Mucinex [guaifenesin Er] Other (See Comments)   Hallucinations "I see pink elephants"      Medication List       Accurate as of April 03, 2020  9:45 AM. If you have any questions, ask your nurse or doctor.        amLODipine 10 MG tablet Commonly known as: NORVASC TAKE 1 TABLET BY MOUTH  DAILY   aspirin 81 MG tablet Take 81 mg by mouth daily.   cloNIDine 0.3 MG tablet Commonly known as: CATAPRES TAKE 1 TABLET BY MOUTH EVERY NIGHT AT BEDTIME FOR HYPERTENSION   empagliflozin 25 MG Tabs tablet Commonly known as: Jardiance Take 1 tablet (25 mg total) by mouth daily before breakfast. Started by: Elayne Snare, MD   FreeStyle Libre 2 Sensor Misc 2 Devices by Does not apply route every 14 (fourteen) days. Started by: Elayne Snare, MD   Lantus SoloStar 100 UNIT/ML Solostar Pen Generic drug: insulin glargine Give up to 65 units SQ qhs. Patient will be up-titrating this med dose as needed to get glucoses into normal range. What changed:   how much to take  additional instructions   levothyroxine 137 MCG tablet Commonly known as: SYNTHROID TAKE 1 TABLET BY MOUTH  DAILY BEFORE BREAKFAST   loratadine 10 MG tablet Commonly known as: CLARITIN Take 10 mg by mouth daily as needed for allergies.   metFORMIN 500 MG tablet Commonly known as: GLUCOPHAGE TAKE 1 TABLET BY MOUTH  TWICE DAILY WITH MEALS   mupirocin ointment 2 % Commonly known as: BACTROBAN Apply 1 application topically 3 (three) times daily.   ONE TOUCH ULTRA 2 w/Device Kit USE TO CHECK BLOOD SUGAR  ONCE DAILY   OneTouch Delica Plus [id number] Misc USE TO CHECK BLOOD SUGAR  ONCE DAILY   OneTouch Ultra test strip Generic drug: glucose blood USE TO CHECK BLOOD SUGAR  ONCE DAILY   ramipril 10 MG capsule Commonly known as: ALTACE TAKE 1 CAPSULE BY MOUTH  TWICE DAILY   senna-docusate 8.6-50 MG tablet Commonly known as: Senokot-S Take 1 tablet by mouth 2 (two) times  daily.       Allergies:  Allergies  Allergen Reactions   Atenolol     Severe "slowing"of functioning Severe "slowing"of functioning   Hydroxyzine Other (See Comments)    "felt like I was coming out of my skin"   Tetracycline     Rash Because of a history of documented adverse serious drug reaction;Medi Alert bracelet  is recommended Rash Because of a history of documented adverse serious  drug reaction;Medi Alert braceletis recommended   Atorvastatin     D/Ced by him due to Myalgias in Sept 2014 D/Ced by him due to Myalgias in Sept 2014   Codeine     Hallucinations. "I see pink elephants"   Guaifenesin     Other reaction(s): Other (See Comments) unknown unknown   Mucinex [Guaifenesin Er] Other (See Comments)    Hallucinations "I see pink elephants"    Past Medical History:  Diagnosis Date   Allergy    Arthritis    Atypical chest pain 04/2017   ACS ruled out 05/11/17   CAP (community acquired pneumonia) 05/07/2017   Chronic kidney disease    kidney stones   Diabetes mellitus without complication ([place])    Poor control starting fall 2021-->GAD ab neg.  Insulin and C peptide levels wnl but on lower end of normal.   History of thyroidectomy, total 2018   Hurlthe cell adenoma   Hx of adenomatous polyp of colon 07/02/2009   As of 09/2016, pt is aware he is overdue for repeat colonoscopy---he knows to call [facility] to set this up.   Hyperlipidemia    Intol of multiple statins and zetia. Advanced lipid clinic appt planned 2022.   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 Cammy Copa   Right ureteral calculus 04/2018   Cystoscopy with right retrograde pyelogram and right ureteral stent placement after stone removal.   Seasonal allergies    Stroke  ([place])    in 54M no complications    Tachyarrhythmia 1980   age 56 X 2 over 69 month period.  No recurrence since that time.   Thoracic aortic aneurysm (HCC)    Incidentally discovered, 4.1 cm-->Dr. Cyndia Bent. Rpt CT 08/2018 and 08/2019 stable.   Vocal cord paralysis 07/2016   Left vocal cord paralysis noted after thyroid surgery.    Past Surgical History:  Procedure Laterality Date   CARDIAC CATHETERIZATION  1980   COLONOSCOPY W/ POLYPECTOMY  07/02/2009; 01/24/20   2011 adenoma.  2021 path pending   CYSTOSCOPY WITH RETROGRADE PYELOGRAM, URETEROSCOPY AND STENT PLACEMENT Right 05/09/2018   Procedure: CYSTOSCOPY WITH RIGHT RETROGRADE RIGHT URETEROSCOPY STONE EXTRACTION RIGHT STENT EXCHANGE;  Surgeon: Lucas Mallow, MD;  Location: [facility];  Service: Urology;  Laterality: Right;   CYSTOSCOPY/RETROGRADE/URETEROSCOPY/STONE EXTRACTION WITH BASKET Right 04/30/2018   Procedure: CYSTOSCOPY/RETROGRADE/RIGHT URETEROSCOPY/;  Surgeon: Lucas Mallow, MD;  Location: [facility];  Service: Urology;  Laterality: Right;   ESOPHAGOGASTRODUODENOSCOPY  07/02/2009   NORMAL   EYE SURGERY     finger amputaion     with reattachement   INCISION AND DRAINAGE ABSCESS ANAL  11/23/2019   gen surg (In ED).   LASIK     THYROIDECTOMY N/A 08/03/2016   Procedure: Total THYROIDECTOMY;  Surgeon: [name], MD;  Location: [facility] OR;  Service: ENT;  Laterality: N/A;  Total Thyroidectomy    UPPER GASTROINTESTINAL ENDOSCOPY      Family History  Problem Relation Age of Onset   Diabetes Mother    Cancer Mother        lung   COPD Mother    Brain cancer Mother    Arthritis Mother    Hyperlipidemia Mother    Heart disease Mother  Hypertension Mother    Diabetes Father    Cancer Father        mesothelioma; skin   Mental illness Father    Arthritis Father    Hyperlipidemia Father    Heart disease Father    Hypertension Father    Heart disease Maternal Grandmother        MI @ 100   Heart  disease Maternal Grandfather        MI @ 4   Cancer Paternal Grandmother    Diabetes Paternal Grandmother    Heart disease Paternal Grandfather        MI @ 42   Thyroid cancer Maternal Aunt    Colon cancer Neg Hx    Esophageal cancer Neg Hx    Rectal cancer Neg Hx    Stomach cancer Neg Hx     Social History:  reports that he has never smoked. His smokeless tobacco use includes snuff. He reports that he does not drink alcohol and does not use drugs.    Review of Systems  Gastrointestinal: Positive for nausea.   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, treated surgically in 07/2016  He is on generic levothyroxine supplementation with 137 g since about 09/2016 With this his TSH is consistently in the normal range Unlikely his fatigue in the afternoon is related to hypothyroidism Has chronic cold intolerance He is very regular with his levothyroxine  DIABETES with BMI about 30 His blood sugars were markedly increased last September, previously not monitoring at home Although A1c improved to 8.5 his blood sugars in the mornings are fluctuating likely to be from postprandial hyperglycemia in the evenings He has not had any nutritional counseling  Recommendations:  Start Jardiance 25 mg daily, this will help overall control, weight loss, better blood pressure management as well as long-term cardiovascular benefits Discussed  action of SGLT 2 drugs on lowering glucose by decreasing kidney absorption of glucose, benefits of weight loss and lower blood pressure, possible side effects including candidiasis and dosage regimen Patient information brochure and co-pay card given  He will start checking blood sugars 2 hours after dinner and sometimes after other meals  If available he can start using the freestyle libre system and this was prescribed, discussed how this works, information provided and need for frequent monitoring.  He will use his smart phone to read blood sugars and let us know if he needs any assistance starting the process  Discussed blood sugar targets before and after meals  If his blood sugars are consistently over 200 after dinner or other meals will start mealtime insulin  Discussed postprandial blood sugar control and need to monitor at different times which will help manage diabetes overall and bring A1c down  With Jardiance he will likely need to increase his fluid intake and  discussed  As a precaution reduce Lantus to 34 units and continue adjusting every 3 days by 2 units to keep morning sugars in target zone  Fatigue: Consider sleep apnea and will defer to PCP for further evaluation  Hypertension: Appears fairly well controlled.  Consider using clonidine 2.1 mg patch instead of using just 0.3 mg at bedtime  He will follow-up in 12 months  Patient Instructions  Check blood sugars on waking up 5-6 days a week  Also check blood sugars about 2 hours after meals and do this after different meals by rotation  Recommended blood sugar levels on waking up are 90-130 and about 2 hours after meal is 130-160  Please bring your blood sugar monitor to each visit, thank you  Lantus 34 daily  Jardiance in am  Amlodipine 1/2 if BP <120   Elayne Snare 04/03/2020

## 2020-04-03 ENCOUNTER — Ambulatory Visit (INDEPENDENT_AMBULATORY_CARE_PROVIDER_SITE_OTHER): Payer: 59 | Admitting: Endocrinology

## 2020-04-03 ENCOUNTER — Encounter: Payer: Self-pay | Admitting: Endocrinology

## 2020-04-03 VITALS — BP 135/80 | HR 76 | Ht 69.0 in | Wt 201.4 lb

## 2020-04-03 DIAGNOSIS — E890 Postprocedural hypothyroidism: Secondary | ICD-10-CM | POA: Diagnosis not present

## 2020-04-03 DIAGNOSIS — I10 Essential (primary) hypertension: Secondary | ICD-10-CM

## 2020-04-03 DIAGNOSIS — E1165 Type 2 diabetes mellitus with hyperglycemia: Secondary | ICD-10-CM | POA: Diagnosis not present

## 2020-04-03 MED ORDER — EMPAGLIFLOZIN 25 MG PO TABS: 25.0000 mg | ORAL_TABLET | Freq: Every day | ORAL | 2 refills | Status: DC

## 2020-04-03 MED ORDER — FREESTYLE LIBRE 2 SENSOR MISC: 2.0000 | 3 refills | Status: DC

## 2020-04-03 NOTE — Patient Instructions (Addendum)
Check blood sugars on waking up 5-6 days a week  Also check blood sugars about 2 hours after meals and do this after different meals by rotation  Recommended blood sugar levels on waking up are 90-130 and about 2 hours after meal is 130-160  Please bring your blood sugar monitor to each visit, thank you  Lantus 34 daily  Jardiance in am  Amlodipine 1/2 if BP <120

## 2020-04-07 ENCOUNTER — Other Ambulatory Visit: Payer: Self-pay | Admitting: Family Medicine

## 2020-04-08 ENCOUNTER — Other Ambulatory Visit: Payer: Self-pay | Admitting: Family Medicine

## 2020-04-10 ENCOUNTER — Ambulatory Visit (INDEPENDENT_AMBULATORY_CARE_PROVIDER_SITE_OTHER): Payer: 59 | Admitting: Internal Medicine

## 2020-04-10 ENCOUNTER — Encounter: Payer: Self-pay | Admitting: Internal Medicine

## 2020-04-10 ENCOUNTER — Other Ambulatory Visit: Payer: Self-pay

## 2020-04-10 VITALS — BP 128/80 | HR 71 | Ht 69.0 in | Wt 200.2 lb

## 2020-04-10 DIAGNOSIS — E782 Mixed hyperlipidemia: Secondary | ICD-10-CM | POA: Diagnosis not present

## 2020-04-10 DIAGNOSIS — I712 Thoracic aortic aneurysm, without rupture, unspecified: Secondary | ICD-10-CM

## 2020-04-10 DIAGNOSIS — E1151 Type 2 diabetes mellitus with diabetic peripheral angiopathy without gangrene: Secondary | ICD-10-CM

## 2020-04-10 DIAGNOSIS — I2510 Atherosclerotic heart disease of native coronary artery without angina pectoris: Secondary | ICD-10-CM

## 2020-04-10 DIAGNOSIS — I700 Atherosclerosis of aorta: Secondary | ICD-10-CM

## 2020-04-10 NOTE — Progress Notes (Addendum)
LIPID CLINIC CONSULT NOTE  Chief Complaint:  Manage dyslipidemia  Primary Care Physician: Tammi Sou, MD  Primary Cardiologist:  No primary care provider on file.  HPI:  Timothy Page is a 65 y.o. male who is being seen today for the evaluation of dyslipidemia at the request of McGowen, Adrian Blackwater, MD.  This is a pleasant 65 year old male kindly referred for evaluation and management of dyslipidemia.  Past medical history significant for atypical chest pain and an episode in his 57s where he had tachycardia at which time he was told he had a heart attack.  He apparently was treated by Dr. Aldona Bar.  He also has a history of dyslipidemia and hypertension.  He has been intolerant unfortunately multiple statins and ezetimibe.  He is referred today for evaluation and management of his cholesterol.  He is currently on no therapy.  His most recent lipids in September showed total cholesterol 176, HDL 31, LDL 116 and triglycerides 208.  He does have diabetes with a hemoglobin A1c of 8.5.  He said diabetes is a relatively new thing along with a number of mother medical problems of started after he had his thyroid removed.  He continues also have issues with his GI system including constipation and other issues.  He is unwilling to take any additional statins due to side effects primarily which were myalgias.  He also was found to have an incidental aortic aneurysm and is followed by Dr. Cyndia Bent but has not had surgery.  PMHx:  Past Medical History:  Diagnosis Date  . Allergy   . Arthritis   . Atypical chest pain 04/2017   ACS ruled out 05/11/17  . CAP (community acquired pneumonia) 05/07/2017  . Chronic kidney disease    kidney stones  . Diabetes mellitus without complication ([place])    Poor control starting fall 2021-->GAD ab neg.  Insulin and C peptide levels wnl but on lower end of normal.  . 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 Hx of adenomatous polyp of  colon 07/02/2009   As of 09/2016, pt is aware he is overdue for repeat colonoscopy---he knows to call  GI to set this up.  . Hyperlipidemia    Intol of multiple statins and zetia. Advanced lipid clinic appt planned 2022.  [name] Hypertension   . Lipoma    back  . Myocardial infarction ([place])    MI at age 61  . Perianal abscess 11/2019   I&D'd by Dr.Thompson, gen surg, in the ED 11/23/19  . 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 Cammy Copa  . Right ureteral calculus 04/2018   Cystoscopy with right retrograde pyelogram and right ureteral stent placement after stone removal.  . Seasonal allergies   . Stroke ([place])    in 79M no complications   . Tachyarrhythmia 1980   age 42 X 2 over 62 month period.  No recurrence since that time.  . Thoracic aortic aneurysm ([place])    Incidentally discovered, 4.1 cm-->Dr. Cyndia Bent. Rpt CT 08/2018 and 08/2019 stable.  . Vocal cord paralysis 07/2016   Left vocal cord paralysis noted after thyroid surgery.    Past Surgical History:  Procedure Laterality Date  . CARDIAC CATHETERIZATION  1980  . COLONOSCOPY W/ POLYPECTOMY  07/02/2009; 01/24/20   2011 adenoma.  2021 path pending  . CYSTOSCOPY WITH RETROGRADE PYELOGRAM, URETEROSCOPY AND STENT PLACEMENT Right 05/09/2018   Procedure: CYSTOSCOPY WITH RIGHT RETROGRADE RIGHT URETEROSCOPY STONE  EXTRACTION RIGHT STENT EXCHANGE;  Surgeon: Lucas Mallow, MD;  [facility];  Service: Urology;  Laterality: Right;  . CYSTOSCOPY/RETROGRADE/URETEROSCOPY/STONE EXTRACTION WITH BASKET Right 04/30/2018   Procedure: CYSTOSCOPY/RETROGRADE/RIGHT URETEROSCOPY/;  Surgeon: Lucas Mallow, MD;  Location: [facility];  Service: Urology;  Laterality: Right;  . ESOPHAGOGASTRODUODENOSCOPY  07/02/2009   NORMAL  . EYE SURGERY    . finger amputaion     with reattachement  . INCISION AND DRAINAGE ABSCESS ANAL  11/23/2019   gen surg (In ED).  [name] LASIK    . THYROIDECTOMY N/A 08/03/2016    Procedure: Total THYROIDECTOMY;  Surgeon: [name], MD;  Location: [facility];  Service: ENT;  Laterality: N/A;  Total Thyroidectomy   . UPPER GASTROINTESTINAL ENDOSCOPY      FAMHx:  Family History  Problem Relation Age of Onset  . Diabetes Mother   . Cancer Mother        lung  . COPD Mother   . Brain cancer Mother   . Arthritis Mother   . Hyperlipidemia Mother   . Heart disease Mother   . Hypertension Mother   . Diabetes Father   . Cancer Father        mesothelioma; skin  . Mental illness Father   . Arthritis Father   . Hyperlipidemia Father   . Heart disease Father   . Hypertension Father   . Heart disease Maternal Grandmother        MI @ 1  . Heart disease Maternal Grandfather        MI @ 79  . Cancer Paternal Grandmother   . Diabetes Paternal Grandmother   . Heart disease Paternal Grandfather        MI @ 10  . Thyroid cancer Maternal Aunt   . Colon cancer Neg Hx   . Esophageal cancer Neg Hx   . Rectal cancer Neg Hx   . Stomach cancer Neg Hx     SOCHx:   reports that he has never smoked. His smokeless tobacco use includes snuff. He reports that he does not drink alcohol and does not use drugs.  ALLERGIES:  Allergies  Allergen Reactions  . Atenolol     Severe "slowing"of functioning Severe "slowing"of functioning  . Hydroxyzine Other (See Comments)    "felt like I was coming out of my skin"  . Tetracycline     Rash Because of a history of documented adverse serious drug reaction;Medi Alert bracelet  is recommended Rash Because of a history of documented adverse serious drug reaction;Medi Alert braceletis recommended  . Atorvastatin     D/Ced by him due to Myalgias in Sept 2014 D/Ced by him due to Myalgias in Sept 2014  . Codeine     Hallucinations. "I see pink elephants"  . Guaifenesin     Other reaction(s): Other (See Comments) unknown unknown  . Mucinex [Guaifenesin Er] Other (See Comments)    Hallucinations "I see pink elephants"     ROS: Pertinent items noted in HPI and remainder of comprehensive ROS otherwise negative.  HOME MEDS: Current Outpatient Medications on File Prior to Visit  Medication Sig Dispense Refill  . amLODipine (NORVASC) 10 MG tablet TAKE 1 TABLET BY MOUTH  DAILY 30 tablet 0  . aspirin 81 MG tablet Take 81 mg by mouth daily.    . Blood Glucose Monitoring Suppl (ONE TOUCH ULTRA 2) w/Device KIT USE TO CHECK BLOOD SUGAR  ONCE DAILY 1 kit 0  . cloNIDine (CATAPRES) 0.3 MG  tablet TAKE 1 TABLET BY MOUTH EVERY NIGHT AT BEDTIME FOR HYPERTENSION 90 tablet 1  . Continuous Blood Gluc Sensor (FREESTYLE LIBRE 2 SENSOR) MISC 2 Devices by Does not apply route every 14 (fourteen) days. 2 each 3  . empagliflozin (JARDIANCE) 25 MG TABS tablet Take 1 tablet (25 mg total) by mouth daily before breakfast. 30 tablet 2  . glucose blood (ONETOUCH ULTRA) test strip USE TO CHECK BLOOD SUGAR  ONCE DAILY 100 strip 3  . insulin glargine (LANTUS SOLOSTAR) 100 UNIT/ML Solostar Pen Give up to 65 units SQ qhs. Patient will be up-titrating this med dose as needed to get glucoses into normal range. (Patient taking differently: 38-40 Units. Give up to 38-40 units SQ qhs. Patient will be up-titrating this med dose as needed to get glucoses into normal range.) 15 mL 3  . Lancets (ONETOUCH DELICA PLUS POLIDC30D) MISC USE TO CHECK BLOOD SUGAR  ONCE DAILY 100 each 3  . levothyroxine (SYNTHROID) 137 MCG tablet TAKE 1 TABLET BY MOUTH  DAILY BEFORE BREAKFAST 90 tablet 0  . loratadine (CLARITIN) 10 MG tablet Take 10 mg by mouth daily as needed for allergies.    . metFORMIN (GLUCOPHAGE) 500 MG tablet TAKE 1 TABLET BY MOUTH  TWICE DAILY WITH MEALS 180 tablet 3  . ramipril (ALTACE) 10 MG capsule TAKE 1 CAPSULE BY MOUTH  TWICE DAILY 180 capsule 1  . senna-docusate (SENOKOT-S) 8.6-50 MG tablet Take 1 tablet by mouth 2 (two) times daily. 60 tablet 0   No current facility-administered medications on file prior to visit.    LABS/IMAGING: No results  found for this or any previous visit (from the past 48 hour(s)). No results found.  LIPID PANEL:    Component Value Date/Time   CHOL 176 10/30/2019 0927   TRIG 208.0 (H) 10/30/2019 0927   HDL 31.00 (L) 10/30/2019 0927   CHOLHDL 6 10/30/2019 0927   VLDL 41.6 (H) 10/30/2019 0927   LDLCALC 125 (H) 03/15/2019 0927   LDLDIRECT 116.0 10/30/2019 0927    WEIGHTS: Wt Readings from Last 3 Encounters:  04/10/20 200 lb 3.2 oz (90.8 kg)  04/03/20 201 lb 6.4 oz (91.4 kg)  01/29/20 197 lb 3.2 oz (89.4 kg)    VITALS: BP 128/80   Pulse 71   Ht '5\' 9"'  (1.753 m)   Wt 200 lb 3.2 oz (90.8 kg)   SpO2 98%   BMI 29.56 kg/m   EXAM: General appearance: alert and no distress Neck: no carotid bruit, no JVD and thyroid not enlarged, symmetric, no tenderness/mass/nodules Lungs: clear to auscultation bilaterally Heart: regular rate and rhythm, S1, S2 normal, no murmur, click, rub or gallop Abdomen: soft, non-tender; bowel sounds normal; no masses,  no organomegaly Extremities: extremities normal, atraumatic, no cyanosis or edema Pulses: 2+ and symmetric Skin: Skin color, texture, turgor normal. No rashes or lesions Neurologic: Grossly normal Psych: Pleasant  EKG: Deferred  ASSESSMENT: 1. Mixed dyslipidemia, goal LDL less than 70 2. Aortic aneurysm 3. Statin intolerance- myalgias 4. 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   Mr. Curran has a mixed dyslipidemia and I would target his LDL less than 70.  Unfortunately has been intolerant of statins and ezetimibe.  We discussed options today and I think he is a good candidate for PCSK9 inhibitor.  We discussed the risks and benefits of this therapy as well as potential side effects.  We will go ahead and try to obtain prior authorization from his commercial insurance provider.  We will plan repeat lipids in about 3 to 4 months and follow-up at that time.  Thanks again for the kind referral.  Pixie Casino, MD, FACC, [place] Director of the [facility] Diplomate of the American Board of Clinical Lipidology Attending Cardiologist  Direct Dial: [phone]  Fax: [phone]  Website:  [email or web address] 04/10/2020, 4:09 PM

## 2020-04-10 NOTE — Patient Instructions (Addendum)
Medication Instructions:  Dr. Debara Pickett recommends Repatha (PCSK9). This is an injectable cholesterol medication self-administered once every 14 days. This medication will likely need prior approval with your insurance company, which we will work on. If the medication is not approved initially, we may need to do an appeal with your insurance.   Administer medication in area of fatty tissue such as abdomen, outer thigh, back of upper arm - and rotate site with each injection Store medication in refrigerator until ready to administer - allow to sit at room temp for 30 mins - 1 hour prior to injection Dispose of medication in a SHARPS container - your pharmacy should be able to direct you on this and proper disposal   If you need co-pay assistance grant, please look into the program at healthwellfoundation.org >> disease funds >> hypercholesterolemia. This is an online application or you can call to complete. Once approved, you will provide the "pharmacy card" information to your pharmacy and they will deduct the co-pays from this grant.  If you need a co-pay card for Repatha: [email or web address] >> paying for Repatha or red box that says "Repatha Copay Card" in top right If you need a co-pay card for Praluent: WedMap.it >> starting & paying for Praluent  *If you need a refill on your cardiac medications before your next appointment, please call your pharmacy*   Lab Work: Lipid panel today  If you have labs (blood work) drawn today and your tests are completely normal, you will receive your results only by: [name] MyChart Message (if you have MyChart) OR . A paper copy in the mail If you have any lab test that is abnormal or we need to change your treatment, we will call you to review the results.  Follow-Up: At [facility], you and your health needs are our priority.  As part of our continuing mission to provide you with exceptional heart care, we have created designated Provider Care Teams.  These Care  Teams include your primary Cardiologist (physician) and Advanced Practice Providers (APPs -  Physician Assistants and Nurse Practitioners) who all work together to provide you with the care you need, when you need it.  We recommend signing up for the patient portal called "MyChart".  Sign up information is provided on this After Visit Summary.  MyChart is used to connect with patients for Virtual Visits (Telemedicine).  Patients are able to view lab/test results, encounter notes, upcoming appointments, etc.  Non-urgent messages can be sent to your provider as well.   To learn more about what you can do with MyChart, go to [email or web address].    Your next appointment:   3-4 month(s)  The format for your next appointment:   In Person  Provider:   K. Mali Hilty, MD-Lipid clinic

## 2020-04-11 LAB — LIPID PANEL
Chol/HDL Ratio: 4.8 ratio (ref 0.0–5.0)
Cholesterol, Total: 164 mg/dL (ref 100–199)
HDL: 34 mg/dL — ABNORMAL LOW (ref >39)
LDL Chol Calc (NIH): 116 mg/dL — ABNORMAL HIGH (ref 0–99)
Triglycerides: 72 mg/dL (ref 0–149)
VLDL Cholesterol Cal: 14 mg/dL (ref 5–40)

## 2020-04-13 MED ORDER — REPATHA SURECLICK 140 MG/ML ~~LOC~~ SOAJ: 1.0000 | SUBCUTANEOUS | 3 refills | Status: DC

## 2020-04-13 NOTE — Addendum Note (Signed)
Addended by: Fidel Levy on: 04/13/2020 09:06 AM   Modules accepted: Orders

## 2020-04-15 ENCOUNTER — Telehealth: Payer: Self-pay | Admitting: Internal Medicine

## 2020-04-15 NOTE — Telephone Encounter (Signed)
Pt c/o medication issue:  1. Name of Medication: Evolocumab (REPATHA SURECLICK) 967 MG/ML SOAJ  2. How are you currently taking this medication (dosage and times per day)? N/a   3. Are you having a reaction (difficulty breathing--STAT)? N/a  4. What is your medication issue?   Timothy Page with [employer] states they faxed a prior authorization for Repatha to our office on 04/13/20. She would like to ensure that we received it. Please return call to discuss at [phone].

## 2020-04-15 NOTE — Telephone Encounter (Signed)
PA for Repatha Sureclick submitted via CMM (Key: [id number])

## 2020-04-15 NOTE — Telephone Encounter (Signed)
Returned call to [employer] who states that they faxed over a prior authorization for patients Repatha. [facility] would like to verify that we have received that. Advised that I would forward message to Dr. Lysbeth Penner nurse Eliezer Lofts to see if prior authorization was received.

## 2020-04-15 NOTE — Telephone Encounter (Signed)
Request Reference Number: [id number]. REPATHA SURE INJ 140MG /ML is approved through 04/15/2021.  Patient notified via [place]

## 2020-04-17 ENCOUNTER — Other Ambulatory Visit: Payer: Self-pay

## 2020-04-17 ENCOUNTER — Other Ambulatory Visit (INDEPENDENT_AMBULATORY_CARE_PROVIDER_SITE_OTHER): Payer: 59

## 2020-04-17 DIAGNOSIS — E1165 Type 2 diabetes mellitus with hyperglycemia: Secondary | ICD-10-CM

## 2020-04-17 LAB — BASIC METABOLIC PANEL
BUN: 19 mg/dL (ref 6–23)
CO2: 26 mEq/L (ref 19–32)
Calcium: 9.6 mg/dL (ref 8.4–10.5)
Chloride: 109 mEq/L (ref 96–112)
Creatinine, Ser: 1.12 mg/dL (ref 0.40–1.50)
GFR: 69.98 mL/min (ref >60.00)
Glucose, Bld: 111 mg/dL — ABNORMAL HIGH (ref 70–99)
Potassium: 4.6 mEq/L (ref 3.5–5.1)
Sodium: 144 mEq/L (ref 135–145)

## 2020-04-18 LAB — FRUCTOSAMINE: Fructosamine: 333 umol/L — ABNORMAL HIGH (ref 0–285)

## 2020-04-24 ENCOUNTER — Encounter: Payer: 59 | Attending: Family Medicine | Admitting: Dietician

## 2020-04-24 ENCOUNTER — Other Ambulatory Visit: Payer: Self-pay

## 2020-04-24 ENCOUNTER — Ambulatory Visit (INDEPENDENT_AMBULATORY_CARE_PROVIDER_SITE_OTHER): Payer: 59 | Admitting: Endocrinology

## 2020-04-24 ENCOUNTER — Encounter: Payer: Self-pay | Admitting: Endocrinology

## 2020-04-24 VITALS — BP 138/90 | HR 78 | Resp 20 | Ht 69.0 in | Wt 195.0 lb

## 2020-04-24 DIAGNOSIS — E1165 Type 2 diabetes mellitus with hyperglycemia: Secondary | ICD-10-CM | POA: Diagnosis not present

## 2020-04-24 DIAGNOSIS — R5383 Other fatigue: Secondary | ICD-10-CM

## 2020-04-24 DIAGNOSIS — I10 Essential (primary) hypertension: Secondary | ICD-10-CM | POA: Diagnosis not present

## 2020-04-24 DIAGNOSIS — Z794 Long term (current) use of insulin: Secondary | ICD-10-CM

## 2020-04-24 MED ORDER — NOVOLOG FLEXPEN 100 UNIT/ML ~~LOC~~ SOPN
PEN_INJECTOR | SUBCUTANEOUS | 1 refills | Status: DC
Start: 2020-04-24 — End: 2020-06-22

## 2020-04-24 MED ORDER — INSULIN PEN NEEDLE 31G X 5 MM MISC: 1 refills | Status: AC

## 2020-04-24 NOTE — Progress Notes (Signed)
Start:  10:00 End:  1015  Patient is here today as a walk in from Dr. Dwyane Dee. He needs assistance knowing how to use his FreeStyle [place] 2  Patient has an [place] 2 is compatible but patient does not know his password to install the app. Showed patient the app and he will install later. Showed patient a video on how to apply and start the [place] as well as a physical demonstration.  Video on the arrow trends and explanation also shown. Patient verbalized understanding on how to do this.  He states that he also realizes that he has a referral to see me but needs his wife to attend.  He will have to call later for the nutrition visit to know when his wife is available.  Antonieta Iba, RD, LDN, CDCES

## 2020-04-24 NOTE — Progress Notes (Signed)
Patient ID: Timothy Page, male   DOB: 1955-04-13, 65 y.o.   MRN: [id number]            Reason for Appointment: Endocrinology follow-up    History of Present Illness:     DIABETES diagnosis date:  2014  Previous history: He had been on Metformin for most of the duration of the diabetes A1c had been usually between 6.2 and 7.2 until 08/2018  Most recent A1c is 8.5 as of 09/2019  Recent history:      He was started on JARDIANCE about 3 weeks ago because of poor control and likely high postprandial readings  Although he has taken this he still appears to have high postprandial readings when checked, averaging over 200 recently  Highest blood sugar was 416 after eating [place] food  He thinks he is having new symptoms of left chest pain and rectal burning since he started [place] and he thinks this is from the medication  Otherwise tolerating the medication well and his weight is down 5 pounds  FASTING blood sugars at home are overall fluctuating less  Lab glucose 111 fasting  He still thinks he has significant feeling of shakiness when the blood sugars are below 100 i  However he did not reduce his Lantus by 4 units as recommended with starting Jardiance  Previously had gained significant amount of weight  Non-insulin hypoglycemic drugs: Metformin 500 mg twice daily     Insulin regimen:    Lantus 38 units daily         Side effects from medications:  Diarrhea from high-dose Metformin      Monitors blood glucose: Once a day.    Glucometer: One Probation officer.           Blood Glucose readings with 30-day averages   PRE-MEAL Fasting Lunch Dinner Bedtime Overall  Glucose range:  84-168    173-279   Mean/median:  130    220    POST-MEAL PC Breakfast PC Lunch PC Dinner  Glucose range:  ?   Mean/median:        PREVIOUS FASTING blood sugar range 79-228   Hypoglycemia:  As above              Previous nutritional counseling: None  Weight control:  Wt Readings  from Last 3 Encounters:  04/24/20 195 lb (88.5 kg)  04/10/20 200 lb 3.2 oz (90.8 kg)  04/03/20 201 lb 6.4 oz (91.4 kg)   Lab Results  Component Value Date   HGBA1C 8.5 (H) 01/29/2020   HGBA1C 12.8 Repeated and verified X2. 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  Patient was discharged on 150 g of levothyroxine However since TSH was 0.1 this was reduced down to 137 g on his initial consultation in 7/18  RECENT history:  He is very consistent with taking his levothyroxine 137 mcg before breakfast daily with water Not on any vitamins or  calcium supplements at the same time  TSH is normal consistently   Wt Readings from Last 3 Encounters:  04/24/20 195 lb (88.5 kg)  04/10/20 200 lb 3.2 oz (90.8 kg)  04/03/20 201 lb 6.4 oz (91.4 kg)     Lab Results  Component Value Date   TSH 1.13 04/02/2020   TSH 1.35 10/30/2019   TSH 3.63 03/01/2019   FREET4 1.17 04/02/2020   FREET4 1.26 10/30/2019   FREET4 1.20 03/01/2019     Allergies as of 04/24/2020      Reactions   Atenolol    Severe "slowing"of functioning Severe "slowing"of functioning   Hydroxyzine Other (See Comments)   "felt like I was coming out of my skin"   Tetracycline    Rash Because of a history of documented adverse serious drug reaction;Medi Alert bracelet  is recommended Rash Because of a history of documented adverse serious drug reaction;Medi Alert  braceletis recommended   Atorvastatin    D/Ced by him due to Myalgias in Sept 2014 D/Ced by him due to Myalgias in Sept 2014   Codeine    Hallucinations. "I see pink elephants"   Guaifenesin    Other reaction(s): Other (See Comments) unknown unknown   Mucinex [guaifenesin Er] Other (See Comments)   Hallucinations "I see pink elephants"      Medication List       Accurate as of April 24, 2020  4:42 PM. If you have any questions, ask your nurse or doctor.        amLODipine 10 MG tablet Commonly known as: NORVASC TAKE 1 TABLET BY MOUTH  DAILY   aspirin 81 MG tablet Take 81 mg by mouth daily.   cloNIDine 0.3 MG tablet Commonly known as: CATAPRES TAKE 1 TABLET BY MOUTH EVERY NIGHT AT BEDTIME FOR HYPERTENSION   empagliflozin 25 MG Tabs tablet Commonly known as: Jardiance Take 1 tablet (25 mg total) by mouth daily before breakfast.   FreeStyle Libre 2 Sensor Misc 2 Devices by Does not apply route every 14 (fourteen) days.   Lantus SoloStar 100 UNIT/ML Solostar Pen Generic drug: insulin glargine Give up to 65 units SQ qhs. Patient will be up-titrating this med dose as needed to get glucoses into normal range. What changed:   how much to take  additional instructions   levothyroxine 137 MCG tablet Commonly known as: SYNTHROID TAKE 1 TABLET BY MOUTH  DAILY BEFORE BREAKFAST   loratadine 10 MG tablet Commonly known as: CLARITIN Take 10 mg by mouth daily as needed for allergies.   metFORMIN 500 MG tablet Commonly known as: GLUCOPHAGE TAKE 1 TABLET BY MOUTH  TWICE DAILY WITH MEALS   NovoLOG FlexPen 100 UNIT/ML FlexPen Generic drug: insulin aspart upto 12 units before meals tid Started by: Elayne Snare, MD   ONE TOUCH ULTRA 2 w/Device Kit USE TO CHECK BLOOD SUGAR  ONCE DAILY   OneTouch Delica Plus [id number] Misc USE TO CHECK BLOOD SUGAR  ONCE DAILY   OneTouch Ultra test strip Generic drug: glucose blood USE TO CHECK BLOOD SUGAR  ONCE DAILY   ramipril 10 MG  capsule Commonly known as: ALTACE TAKE 1 CAPSULE BY MOUTH  TWICE DAILY   Repatha SureClick 578 MG/ML Soaj Generic drug: Evolocumab Inject 1 Dose into the skin every 14 (fourteen) days.   senna-docusate 8.6-50 MG tablet Commonly known as: Senokot-S Take 1 tablet by mouth 2 (two) times daily.       Allergies:  Allergies  Allergen Reactions  . Atenolol  Severe "slowing"of functioning Severe "slowing"of functioning  . Hydroxyzine Other (See Comments)    "felt like I was coming out of my skin"  . Tetracycline     Rash Because of a history of documented adverse serious drug reaction;Medi Alert bracelet  is recommended Rash Because of a history of documented adverse serious drug reaction;Medi Alert braceletis recommended  . Atorvastatin     D/Ced by him due to Myalgias in Sept 2014 D/Ced by him due to Myalgias in Sept 2014  . Codeine     Hallucinations. "I see pink elephants"  . Guaifenesin     Other reaction(s): Other (See Comments) unknown unknown  . Mucinex [Guaifenesin Er] Other (See Comments)    Hallucinations "I see pink elephants"    Past Medical History:  Diagnosis Date  . Allergy   . Arthritis   . Atypical chest pain 04/2017   ACS ruled out 05/11/17  . CAP (community acquired pneumonia) 05/07/2017  . Chronic kidney disease    kidney stones  . Diabetes mellitus without complication ([place])    Poor control starting fall 2021-->GAD ab neg.  Insulin and C peptide levels wnl but on lower end of normal.  . 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 Hx of adenomatous polyp of colon 07/02/2009   As of 09/2016, pt is aware he is overdue for repeat colonoscopy---he knows to call [facility] to set this up.  . Hyperlipidemia    Intol of multiple statins and zetia. Advanced lipid clinic appt planned 2022.  [name] Hypertension   . Lipoma    back  . Myocardial infarction ([place])    MI at age 57  . Perianal abscess 11/2019   I&D'd by Dr.Thompson, gen  surg, in the ED 11/23/19  . 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 Cammy Copa  . Right ureteral calculus 04/2018   Cystoscopy with right retrograde pyelogram and right ureteral stent placement after stone removal.  . Seasonal allergies   . Stroke ([place])    in 00L no complications   . Tachyarrhythmia 1980   age 20 X 2 over 26 month period.  No recurrence since that time.  . Thoracic aortic aneurysm ([place])    Incidentally discovered, 4.1 cm-->Dr. Cyndia Bent. Rpt CT 08/2018 and 08/2019 stable.  . Vocal cord paralysis 07/2016   Left vocal cord paralysis noted after thyroid surgery.    Past Surgical History:  Procedure Laterality Date  . CARDIAC CATHETERIZATION  1980  . COLONOSCOPY W/ POLYPECTOMY  07/02/2009; 01/24/20   2011 adenoma.  2021 path pending  . CYSTOSCOPY WITH RETROGRADE PYELOGRAM, URETEROSCOPY AND STENT PLACEMENT Right 05/09/2018   Procedure: CYSTOSCOPY WITH RIGHT RETROGRADE RIGHT URETEROSCOPY STONE EXTRACTION RIGHT STENT EXCHANGE;  Surgeon: Lucas Mallow, MD;  Location: [facility];  Service: Urology;  Laterality: Right;  . CYSTOSCOPY/RETROGRADE/URETEROSCOPY/STONE EXTRACTION WITH BASKET Right 04/30/2018   Procedure: CYSTOSCOPY/RETROGRADE/RIGHT URETEROSCOPY/;  Surgeon: Lucas Mallow, MD;  Location: [facility];  Service: Urology;  Laterality: Right;  . ESOPHAGOGASTRODUODENOSCOPY  07/02/2009   NORMAL  . EYE SURGERY    . finger amputaion     with reattachement  . INCISION AND DRAINAGE ABSCESS ANAL  11/23/2019   gen surg (In ED).  [name] LASIK    . THYROIDECTOMY N/A 08/03/2016   Procedure: Total THYROIDECTOMY;  Surgeon: [name], MD;  Location: [facility];  Service: ENT;  Laterality: N/A;  Total Thyroidectomy   . UPPER GASTROINTESTINAL ENDOSCOPY  Family History  Problem Relation Age of Onset  . Diabetes Mother   . Cancer Mother        lung  . COPD Mother   . Brain cancer Mother   . Arthritis Mother   . Hyperlipidemia Mother   . Heart  disease Mother   . Hypertension Mother   . Diabetes Father   . Cancer Father        mesothelioma; skin  . Mental illness Father   . Arthritis Father   . Hyperlipidemia Father   . Heart disease Father   . Hypertension Father   . Heart disease Maternal Grandmother        MI @ 80  . Heart disease Maternal Grandfather        MI @ 9  . Cancer Paternal Grandmother   . Diabetes Paternal Grandmother   . Heart disease Paternal Grandfather        MI @ 56  . Thyroid cancer Maternal Aunt   . Colon cancer Neg Hx   . Esophageal cancer Neg Hx   . Rectal cancer Neg Hx   . Stomach cancer Neg Hx     Social History:  reports that he has never smoked. His smokeless tobacco use includes snuff. He reports that he does not drink alcohol and does not use drugs.    Review of Systems  HYPERTENSION: He is on clonidine 0.3 mg hs, feels drowsy if he takes this twice a day Also taking amlodipine and ramipril 10 mg Blood pressure appears to be fluctuating and was significantly high on the first measurement  Bp at home "normal"  BP Readings from Last 3 Encounters:  04/24/20 138/90  04/10/20 128/80  04/03/20 135/80   Has complaints of persistent fatigue    Examination:   BP 138/90 (BP Location: Left Arm)   Pulse 78   Resp 20   Ht _0  (1.753 m)   Wt 195 lb (88.5 kg)   SpO2 96%   BMI 28.80 kg/m      Assessment/Plan:   DIABETES type II  He is currently on a regimen of Metformin, Jardiance and basal insulin  As above his fasting blood sugars are averaging about 130 or less recently but he does not tolerate low normal readings With starting Jardiance his weight has started coming down instead of going up However he still has significantly high postprandial readings indicating insulin deficiency  Although he is complaining about left chest pain and rectal burning discussed that this is highly unlikely to be related to Jardiance He tends to have intolerance to Metformin also with  higher doses at least  Recommendations:  He can leave off Jardiance for some time to see if any of his above symptoms are better  Also stop Metformin as this is likely not very effective and may be causing GI symptoms  Today discussed in detail the need for mealtime insulin to cover postprandial spikes, action of mealtime insulin, use of the insulin pen, timing and action of the rapid acting insulin as well as starting dose and dosage titration to target the two-hour reading of under 180  He will start with 5 to 7 units before suppertime and increase gradually until his postprandial readings are around 130-180 range.  Patient education material given on mealtime insulin and blood sugar targets  He will use the NovoLog FlexPen and encouraged him to take it 5 to 10-minute before evening meal  Also if he has high readings after breakfast or  lunch he may need to start NovoLog at those times also  Freestyle LIBRE will be started with the help of the diabetes educator and discussed how the monitoring with this will help  He will reduce Lantus to 34 units and continue adjusting every 3 days by 2 units to keep morning sugars around 100-140  Fatigue: Possibly sleep apnea and he has not discussed with PCP as yet  Follow-up with diabetes educator in a couple of weeks and in the office in about 6 weeks Also needs to schedule consultation with diabetes educator for comprehensive education including meal planning  Patient Instructions  Check blood sugars on waking up 3-4 days a week  Also check blood sugars about 2 hours after meals and do this after different meals by rotation  Recommended blood sugar levels on waking up are 90-130 and about 2 hours after meal is 130-180  Please bring your blood sugar monitor to each visit, thank you  Novolog injection5-10 min before supper  Stop Metformin   Elayne Snare 04/24/2020

## 2020-04-24 NOTE — Patient Instructions (Addendum)
Check blood sugars on waking up 3-4 days a week  Also check blood sugars about 2 hours after meals and do this after different meals by rotation  Recommended blood sugar levels on waking up are 90-130 and about 2 hours after meal is 130-180  Please bring your blood sugar monitor to each visit, thank you  Novolog injection5-10 min before supper  Stop Metformin

## 2020-04-28 ENCOUNTER — Telehealth: Payer: Self-pay | Admitting: *Deleted

## 2020-04-28 ENCOUNTER — Telehealth: Payer: Self-pay | Admitting: Endocrinology

## 2020-04-28 ENCOUNTER — Other Ambulatory Visit: Payer: Self-pay | Admitting: *Deleted

## 2020-04-28 DIAGNOSIS — E1165 Type 2 diabetes mellitus with hyperglycemia: Secondary | ICD-10-CM

## 2020-04-28 DIAGNOSIS — Z794 Long term (current) use of insulin: Secondary | ICD-10-CM

## 2020-04-28 MED ORDER — INSULIN LISPRO (1 UNIT DIAL) 100 UNIT/ML (KWIKPEN): PEN_INJECTOR | SUBCUTANEOUS | 1 refills | Status: DC

## 2020-04-28 NOTE — Telephone Encounter (Signed)
Dr Dwyane Dee recently sent a prescription for Novalog but insurance does not cover it. Insurance sent a request to switch medication to Humalog to see if it was ok? Pt wants to know if Humalog could be sent to pharmacy instead  [facility], [place] - [address] Phone:  [phone]  Fax:  [phone]

## 2020-04-28 NOTE — Telephone Encounter (Signed)
LVM--Humalog have been sent to the pharmacy, ok by Dr. Dwyane Dee.

## 2020-04-28 NOTE — Telephone Encounter (Signed)
LVM--Novolog aspart flexpen--not cover by insurance and sent Humalog kwikpen 100 unit/ML to the pharmacy.

## 2020-04-30 ENCOUNTER — Other Ambulatory Visit: Payer: 59

## 2020-05-13 ENCOUNTER — Other Ambulatory Visit: Payer: Self-pay | Admitting: Internal Medicine

## 2020-05-13 MED ORDER — REPATHA SURECLICK 140 MG/ML ~~LOC~~ SOAJ: 1.0000 | SUBCUTANEOUS | 3 refills | Status: DC

## 2020-05-13 NOTE — Telephone Encounter (Signed)
repatha refilled [facility]

## 2020-05-14 ENCOUNTER — Other Ambulatory Visit: Payer: Self-pay

## 2020-05-14 MED ORDER — REPATHA SURECLICK 140 MG/ML ~~LOC~~ SOAJ: 1.0000 | SUBCUTANEOUS | 3 refills | Status: DC

## 2020-05-15 ENCOUNTER — Other Ambulatory Visit: Payer: Self-pay | Admitting: Internal Medicine

## 2020-05-15 DIAGNOSIS — E782 Mixed hyperlipidemia: Secondary | ICD-10-CM

## 2020-05-18 ENCOUNTER — Other Ambulatory Visit: Payer: Self-pay | Admitting: *Deleted

## 2020-05-18 DIAGNOSIS — E1165 Type 2 diabetes mellitus with hyperglycemia: Secondary | ICD-10-CM

## 2020-05-18 MED ORDER — INSULIN LISPRO (1 UNIT DIAL) 100 UNIT/ML (KWIKPEN): PEN_INJECTOR | SUBCUTANEOUS | 1 refills | Status: DC

## 2020-05-23 ENCOUNTER — Other Ambulatory Visit: Payer: Self-pay | Admitting: Family Medicine

## 2020-06-01 ENCOUNTER — Ambulatory Visit: Payer: 59 | Admitting: Endocrinology

## 2020-06-02 ENCOUNTER — Other Ambulatory Visit: Payer: Self-pay | Admitting: Endocrinology

## 2020-06-03 NOTE — Progress Notes (Deleted)
Patient ID: Timothy Page, male   DOB: 1955-04-13, 65 y.o.   MRN: [id number]            Reason for Appointment: Endocrinology follow-up    History of Present Illness:     DIABETES diagnosis date:  2014  Previous history: He had been on Metformin for most of the duration of the diabetes A1c had been usually between 6.2 and 7.2 until 08/2018  Most recent A1c is 8.5 as of 09/2019  Recent history:      He was started on JARDIANCE about 3 weeks ago because of poor control and likely high postprandial readings  Although he has taken this he still appears to have high postprandial readings when checked, averaging over 200 recently  Highest blood sugar was 416 after eating [place] food  He thinks he is having new symptoms of left chest pain and rectal burning since he started [place] and he thinks this is from the medication  Otherwise tolerating the medication well and his weight is down 5 pounds  FASTING blood sugars at home are overall fluctuating less  Lab glucose 111 fasting  He still thinks he has significant feeling of shakiness when the blood sugars are below 100 i  However he did not reduce his Lantus by 4 units as recommended with starting Jardiance  Previously had gained significant amount of weight  Non-insulin hypoglycemic drugs: Metformin 500 mg twice daily     Insulin regimen:    Lantus 38 units daily         Side effects from medications:  Diarrhea from high-dose Metformin      Monitors blood glucose: Once a day.    Glucometer: One Probation officer.           Blood Glucose readings with 30-day averages   PRE-MEAL Fasting Lunch Dinner Bedtime Overall  Glucose range:  84-168    173-279   Mean/median:  130    220    POST-MEAL PC Breakfast PC Lunch PC Dinner  Glucose range:  ?   Mean/median:        PREVIOUS FASTING blood sugar range 79-228   Hypoglycemia:  As above              Previous nutritional counseling: None  Weight control:  Wt Readings  from Last 3 Encounters:  04/24/20 195 lb (88.5 kg)  04/10/20 200 lb 3.2 oz (90.8 kg)  04/03/20 201 lb 6.4 oz (91.4 kg)   Lab Results  Component Value Date   HGBA1C 8.5 (H) 01/29/2020   HGBA1C 12.8 Repeated and verified X2. 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  Patient was discharged on 150 g of levothyroxine However since TSH was 0.1 this was reduced down to 137 g on his initial consultation in 7/18  RECENT history:  He is very consistent with taking his levothyroxine 137 mcg before breakfast daily with water Not on any vitamins or  calcium supplements at the same time  TSH is normal consistently   Wt Readings from Last 3 Encounters:  04/24/20 195 lb (88.5 kg)  04/10/20 200 lb 3.2 oz (90.8 kg)  04/03/20 201 lb 6.4 oz (91.4 kg)     Lab Results  Component Value Date   TSH 1.13 04/02/2020   TSH 1.35 10/30/2019   TSH 3.63 03/01/2019   FREET4 1.17 04/02/2020   FREET4 1.26 10/30/2019   FREET4 1.20 03/01/2019     Allergies as of 06/04/2020      Reactions   Atenolol    Severe "slowing"of functioning Severe "slowing"of functioning   Hydroxyzine Other (See Comments)   "felt like I was coming out of my skin"   Tetracycline    Rash Because of a history of documented adverse serious drug reaction;Medi Alert bracelet  is recommended Rash Because of a history of documented adverse serious drug reaction;Medi Alert  braceletis recommended   Atorvastatin    D/Ced by him due to Myalgias in Sept 2014 D/Ced by him due to Myalgias in Sept 2014   Codeine    Hallucinations. "I see pink elephants"   Guaifenesin    Other reaction(s): Other (See Comments) unknown unknown   Mucinex [guaifenesin Er] Other (See Comments)   Hallucinations "I see pink elephants"      Medication List       Accurate as of June 03, 2020  9:04 PM. If you have any questions, ask your nurse or doctor.        amLODipine 10 MG tablet Commonly known as: NORVASC TAKE 1 TABLET BY MOUTH  DAILY   aspirin 81 MG tablet Take 81 mg by mouth daily.   cloNIDine 0.3 MG tablet Commonly known as: CATAPRES TAKE 1 TABLET BY MOUTH EVERY NIGHT AT BEDTIME FOR HYPERTENSION   empagliflozin 25 MG Tabs tablet Commonly known as: Jardiance Take 1 tablet (25 mg total) by mouth daily before breakfast.   FreeStyle Libre 2 Sensor Misc 2 Devices by Does not apply route every 14 (fourteen) days.   insulin lispro 100 UNIT/ML KwikPen Commonly known as: HumaLOG KwikPen upto 12 units before meals tid   Insulin Pen Needle 31G X 5 MM Misc Use with insulin pen 3 times a day   Lantus SoloStar 100 UNIT/ML Solostar Pen Generic drug: insulin glargine Give up to 65 units SQ qhs. Patient will be up-titrating this med dose as needed to get glucoses into normal range. What changed:   how much to take  additional instructions   levothyroxine 137 MCG tablet Commonly known as: SYNTHROID TAKE 1 TABLET BY MOUTH  DAILY BEFORE BREAKFAST   loratadine 10 MG tablet Commonly known as: CLARITIN Take 10 mg by mouth daily as needed for allergies.   metFORMIN 500 MG tablet Commonly known as: GLUCOPHAGE TAKE 1 TABLET BY MOUTH  TWICE DAILY WITH MEALS   NovoLOG FlexPen 100 UNIT/ML FlexPen Generic drug: insulin aspart upto 12 units before meals tid   ONE TOUCH ULTRA 2 w/Device Kit USE TO CHECK BLOOD SUGAR  ONCE DAILY   OneTouch Delica Plus [id number]  Misc USE TO CHECK BLOOD SUGAR  ONCE DAILY   OneTouch Ultra test strip Generic drug: glucose blood USE TO CHECK BLOOD SUGAR  ONCE DAILY   ramipril 10 MG capsule Commonly known as: ALTACE TAKE 1 CAPSULE BY MOUTH  TWICE DAILY   Repatha SureClick 935 MG/ML Soaj Generic drug: Evolocumab Inject 1 Dose into the skin every 14 (fourteen) days.   senna-docusate 8.6-50  MG tablet Commonly known as: Senokot-S Take 1 tablet by mouth 2 (two) times daily.       Allergies:  Allergies  Allergen Reactions  . Atenolol     Severe "slowing"of functioning Severe "slowing"of functioning  . Hydroxyzine Other (See Comments)    "felt like I was coming out of my skin"  . Tetracycline     Rash Because of a history of documented adverse serious drug reaction;Medi Alert bracelet  is recommended Rash Because of a history of documented adverse serious drug reaction;Medi Alert braceletis recommended  . Atorvastatin     D/Ced by him due to Myalgias in Sept 2014 D/Ced by him due to Myalgias in Sept 2014  . Codeine     Hallucinations. "I see pink elephants"  . Guaifenesin     Other reaction(s): Other (See Comments) unknown unknown  . Mucinex [Guaifenesin Er] Other (See Comments)    Hallucinations "I see pink elephants"    Past Medical History:  Diagnosis Date  . Allergy   . Arthritis   . Atypical chest pain 04/2017   ACS ruled out 05/11/17  . CAP (community acquired pneumonia) 05/07/2017  . Chronic kidney disease    kidney stones  . Diabetes mellitus without complication ([place])    Poor control starting fall 2021-->GAD ab neg.  Insulin and C peptide levels wnl but on lower end of normal.  . 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 Hx of adenomatous polyp of colon 07/02/2009   As of 09/2016, pt is aware he is overdue for repeat colonoscopy---he knows to call [facility] to set this up.  . Hyperlipidemia    Intol of multiple statins and zetia. Advanced lipid clinic appt  planned 2022.  [name] Hypertension   . Lipoma    back  . Myocardial infarction ([place])    MI at age 24  . Perianal abscess 11/2019   I&D'd by Dr.Thompson, gen surg, in the ED 11/23/19  . 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 Cammy Copa  . Right ureteral calculus 04/2018   Cystoscopy with right retrograde pyelogram and right ureteral stent placement after stone removal.  . Seasonal allergies   . Stroke ([place])    in 78L no complications   . Tachyarrhythmia 1980   age 21 X 2 over 53 month period.  No recurrence since that time.  . Thoracic aortic aneurysm ([place])    Incidentally discovered, 4.1 cm-->Dr. Cyndia Bent. Rpt CT 08/2018 and 08/2019 stable.  . Vocal cord paralysis 07/2016   Left vocal cord paralysis noted after thyroid surgery.    Past Surgical History:  Procedure Laterality Date  . CARDIAC CATHETERIZATION  1980  . COLONOSCOPY W/ POLYPECTOMY  07/02/2009; 01/24/20   2011 adenoma.  2021 path pending  . CYSTOSCOPY WITH RETROGRADE PYELOGRAM, URETEROSCOPY AND STENT PLACEMENT Right 05/09/2018   Procedure: CYSTOSCOPY WITH RIGHT RETROGRADE RIGHT URETEROSCOPY STONE EXTRACTION RIGHT STENT EXCHANGE;  Surgeon: Lucas Mallow, MD;  Location: [facility];  Service: Urology;  Laterality: Right;  . CYSTOSCOPY/RETROGRADE/URETEROSCOPY/STONE EXTRACTION WITH BASKET Right 04/30/2018   Procedure: CYSTOSCOPY/RETROGRADE/RIGHT URETEROSCOPY/;  Surgeon: Lucas Mallow, MD;  Location: [facility];  Service: Urology;  Laterality: Right;  . ESOPHAGOGASTRODUODENOSCOPY  07/02/2009   NORMAL  . EYE SURGERY    . finger amputaion     with reattachement  . INCISION AND DRAINAGE ABSCESS ANAL  11/23/2019   gen surg (In ED).  [name] LASIK    . THYROIDECTOMY  N/A 08/03/2016   Procedure: Total THYROIDECTOMY;  Surgeon: [name], MD;  Location: [facility];  Service: ENT;  Laterality: N/A;  Total Thyroidectomy   . UPPER GASTROINTESTINAL ENDOSCOPY      Family History  Problem Relation Age of  Onset  . Diabetes Mother   . Cancer Mother        lung  . COPD Mother   . Brain cancer Mother   . Arthritis Mother   . Hyperlipidemia Mother   . Heart disease Mother   . Hypertension Mother   . Diabetes Father   . Cancer Father        mesothelioma; skin  . Mental illness Father   . Arthritis Father   . Hyperlipidemia Father   . Heart disease Father   . Hypertension Father   . Heart disease Maternal Grandmother        MI @ 53  . Heart disease Maternal Grandfather        MI @ 35  . Cancer Paternal Grandmother   . Diabetes Paternal Grandmother   . Heart disease Paternal Grandfather        MI @ 74  . Thyroid cancer Maternal Aunt   . Colon cancer Neg Hx   . Esophageal cancer Neg Hx   . Rectal cancer Neg Hx   . Stomach cancer Neg Hx     Social History:  reports that he has never smoked. His smokeless tobacco use includes snuff. He reports that he does not drink alcohol and does not use drugs.    Review of Systems  HYPERTENSION: He is on clonidine 0.3 mg hs, feels drowsy if he takes this twice a day Also taking amlodipine and ramipril 10 mg Blood pressure appears to be fluctuating and was significantly high on the first measurement  Bp at home "normal"  BP Readings from Last 3 Encounters:  04/24/20 138/90  04/10/20 128/80  04/03/20 135/80   Has complaints of persistent fatigue    Examination:   There were no vitals taken for this visit.     Assessment/Plan:   DIABETES type II  He is currently on a regimen of Metformin, Jardiance and basal insulin  As above his fasting blood sugars are averaging about 130 or less recently but he does not tolerate low normal readings With starting Jardiance his weight has started coming down instead of going up However he still has significantly high postprandial readings indicating insulin deficiency  Although he is complaining about left chest pain and rectal burning discussed that this is highly unlikely to be related  to Jardiance He tends to have intolerance to Metformin also with higher doses at least  Recommendations:  He can leave off Jardiance for some time to see if any of his above symptoms are better  Also stop Metformin as this is likely not very effective and may be causing GI symptoms  Today discussed in detail the need for mealtime insulin to cover postprandial spikes, action of mealtime insulin, use of the insulin pen, timing and action of the rapid acting insulin as well as starting dose and dosage titration to target the two-hour reading of under 180  He will start with 5 to 7 units before suppertime and increase gradually until his postprandial readings are around 130-180 range.  Patient education material given on mealtime insulin and blood sugar targets  He will use the NovoLog FlexPen and encouraged him to take it 5 to 10-minute before evening meal  Also if he  has high readings after breakfast or lunch he may need to start NovoLog at those times also  Freestyle LIBRE will be started with the help of the diabetes educator and discussed how the monitoring with this will help  He will reduce Lantus to 34 units and continue adjusting every 3 days by 2 units to keep morning sugars around 100-140  Fatigue: Possibly sleep apnea and he has not discussed with PCP as yet  Follow-up with diabetes educator in a couple of weeks and in the office in about 6 weeks Also needs to schedule consultation with diabetes educator for comprehensive education including meal planning  There are no Patient Instructions on file for this visit.  Elayne Snare 06/03/2020

## 2020-06-04 ENCOUNTER — Ambulatory Visit: Payer: 59 | Admitting: Endocrinology

## 2020-06-08 ENCOUNTER — Other Ambulatory Visit: Payer: Self-pay

## 2020-06-08 MED ORDER — LANTUS SOLOSTAR 100 UNIT/ML ~~LOC~~ SOPN
PEN_INJECTOR | SUBCUTANEOUS | 1 refills | Status: DC
Start: 2020-06-08 — End: 2020-06-22

## 2020-06-08 MED ORDER — CLONIDINE HCL 0.3 MG PO TABS: ORAL_TABLET | ORAL | 1 refills | Status: DC

## 2020-06-12 ENCOUNTER — Ambulatory Visit (INDEPENDENT_AMBULATORY_CARE_PROVIDER_SITE_OTHER): Payer: 59 | Admitting: Family Medicine

## 2020-06-12 ENCOUNTER — Telehealth: Payer: Self-pay

## 2020-06-12 ENCOUNTER — Other Ambulatory Visit: Payer: Self-pay

## 2020-06-12 ENCOUNTER — Encounter: Payer: Self-pay | Admitting: Family Medicine

## 2020-06-12 VITALS — BP 139/83 | HR 72 | Temp 97.7°F | Wt 193.4 lb

## 2020-06-12 DIAGNOSIS — E119 Type 2 diabetes mellitus without complications: Secondary | ICD-10-CM

## 2020-06-12 DIAGNOSIS — G8929 Other chronic pain: Secondary | ICD-10-CM

## 2020-06-12 DIAGNOSIS — E7800 Pure hypercholesterolemia, unspecified: Secondary | ICD-10-CM | POA: Diagnosis not present

## 2020-06-12 DIAGNOSIS — E890 Postprocedural hypothyroidism: Secondary | ICD-10-CM

## 2020-06-12 DIAGNOSIS — E139 Other specified diabetes mellitus without complications: Secondary | ICD-10-CM

## 2020-06-12 DIAGNOSIS — R140 Abdominal distension (gaseous): Secondary | ICD-10-CM

## 2020-06-12 DIAGNOSIS — R109 Unspecified abdominal pain: Secondary | ICD-10-CM | POA: Diagnosis not present

## 2020-06-12 LAB — CBC WITH DIFFERENTIAL/PLATELET
Basophils Absolute: 0 10*3/uL (ref 0.0–0.1)
Basophils Relative: 0.8 % (ref 0.0–3.0)
Eosinophils Absolute: 0 10*3/uL (ref 0.0–0.7)
Eosinophils Relative: 0.5 % (ref 0.0–5.0)
HCT: 39.9 % (ref 39.0–52.0)
Hemoglobin: 13.7 g/dL (ref 13.0–17.0)
Lymphocytes Relative: 21.9 % (ref 12.0–46.0)
Lymphs Abs: 1.2 10*3/uL (ref 0.7–4.0)
MCHC: 34.4 g/dL (ref 30.0–36.0)
MCV: 89.3 fl (ref 78.0–100.0)
Monocytes Absolute: 0.3 10*3/uL (ref 0.1–1.0)
Monocytes Relative: 6.3 % (ref 3.0–12.0)
Neutro Abs: 3.7 10*3/uL (ref 1.4–7.7)
Neutrophils Relative %: 70.5 % (ref 43.0–77.0)
Platelets: 175 10*3/uL (ref 150.0–400.0)
RBC: 4.47 Mil/uL (ref 4.22–5.81)
RDW: 13.2 % (ref 11.5–15.5)
WBC: 5.3 10*3/uL (ref 4.0–10.5)

## 2020-06-12 LAB — LIPID PANEL
Cholesterol: 139 mg/dL (ref 0–200)
HDL: 33.6 mg/dL — ABNORMAL LOW (ref >39.00)
LDL Cholesterol: 93 mg/dL (ref 0–99)
NonHDL: 105.04
Total CHOL/HDL Ratio: 4
Triglycerides: 58 mg/dL (ref 0.0–149.0)
VLDL: 11.6 mg/dL (ref 0.0–40.0)

## 2020-06-12 LAB — COMPREHENSIVE METABOLIC PANEL
ALT: 16 U/L (ref 0–53)
AST: 14 U/L (ref 0–37)
Albumin: 4.2 g/dL (ref 3.5–5.2)
Alkaline Phosphatase: 81 U/L (ref 39–117)
BUN: 17 mg/dL (ref 6–23)
CO2: 26 mEq/L (ref 19–32)
Calcium: 9.6 mg/dL (ref 8.4–10.5)
Chloride: 105 mEq/L (ref 96–112)
Creatinine, Ser: 1.08 mg/dL (ref 0.40–1.50)
GFR: 73.02 mL/min (ref >60.00)
Glucose, Bld: 155 mg/dL — ABNORMAL HIGH (ref 70–99)
Potassium: 4.1 mEq/L (ref 3.5–5.1)
Sodium: 138 mEq/L (ref 135–145)
Total Bilirubin: 0.5 mg/dL (ref 0.2–1.2)
Total Protein: 7 g/dL (ref 6.0–8.3)

## 2020-06-12 LAB — HEMOGLOBIN A1C: Hgb A1c MFr Bld: 7.7 % — ABNORMAL HIGH (ref 4.6–6.5)

## 2020-06-12 LAB — LIPASE: Lipase: 15 U/L (ref 11.0–59.0)

## 2020-06-12 NOTE — Telephone Encounter (Signed)
Ok, referral ordered ?

## 2020-06-12 NOTE — Telephone Encounter (Signed)
Pt informed to expect call in 7-10 business days

## 2020-06-12 NOTE — Patient Instructions (Signed)
  Dr. Carlean Purl with [facility] gastroenterology for appointment  [phone]

## 2020-06-12 NOTE — Progress Notes (Signed)
OFFICE VISIT  06/12/2020  CC:  Chief Complaint  Patient presents with  . Follow-up    RCI; pt is fasting     HPI:    Patient is a 65 y.o. Caucasian male who presents for 4 mo f/u HTN, HLD, chronic constipation, hx of unintentional wt loss of unknown etiology.  A/P as of last visit: "1) Abnormal wt loss: this has now started to level out and go back up. Normal lab eval, recent reassuring colonoscopy.  Pt very relieved. Question whether this wt loss was due to metabolic changes from poorly controlled DM---type "1 vs 1.5". Cont slow titrate lantus and cont metformin 500 mg bid, ask Dr. Dwyane Dee to see him for this when he goes for f/u thyroid in Jan 2022.   Hba1c and bmet today.  2) Chronic constipation: gradually improving. Inc miralax to 1 cap bid, cont senakot.  3) Perirectal abscess: hx of I&D x 2 in the last couple months---doing well now. Unclear etiology.  Colonoscopy 5 d/a w/out significantly concerning abnormality.  4) HTN: well controlled. Cont amlod 10 qd, ramipril 10 bid, clonidine 0.42m qhs. Lytes/cr today."  INTERIM HX: He now is being followed by endo (Dr. KDwyane Dee for his DM as well as his postsurgical hypothyroidism.   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 KDwyane Deemost recently d/c'd his metformin and started him on mealtime insulin in addition to his basal insulin.  Also has him on jardiance.   HLD: He also now followed [facility].  Was recommended to start repatha about 6-8 wks ago but pt did not start it for fear of it raising his sugars.  Onset about 1 mo ago: Pt says he had onset of very full stomach, early satiety, stomach cramps and hurts up into L chest area every time he eats.  No n/v but says abd feels bloated/distended. Additionally, he continues to suffer from severe constipation--this morning is first time he has had a BM in "3 wks" and it was very small.  Miralax tid, metamucil daily, senakot daily.    ROS as above, plus--> no fevers, no CP,  no SOB, no wheezing, no cough, no dizziness, no HAs, no rashes, no melena/hematochezia.  No polyuria or polydipsia.  No myalgias or arthralgias.  No focal weakness, paresthesias, or tremors.  No acute vision or hearing abnormalities.  No dysuria or unusual/new urinary urgency or frequency.  No recent changes in lower legs.  No palpitations.    Past Medical History:  Diagnosis Date  . Arthritis   . Atypical chest pain 04/2017   ACS ruled out 05/11/17  . CAP (community acquired pneumonia) 05/07/2017  . [place]    Poor control starting fall 2021-->GAD ab neg.  Insulin and C peptide levels wnl but on lower end of normal->Dr Kuma/endo 2022.  .Marland KitchenHistory of thyroidectomy, total 2018   Hurlthe cell adenoma  . Hx of adenomatous polyp of colon 07/02/2009   rpt colonoscopy 2026  . Hyperlipidemia    Intol of multiple statins and zetia. [facility] 2022.  .Marland KitchenHypertension   . Lipoma    back  . [place]    [place] at age 65 . Perianal abscess 11/2019   I&D'd by Dr.Thompson, gen surg, in the ED 11/23/19  . 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 KCammy Copa . Right ureteral calculus 04/2018   Cystoscopy with right retrograde pyelogram and right ureteral stent placement after stone  removal.  . Seasonal allergies   . Stroke ([place])    in 16X no complications   . Tachyarrhythmia 1980   age 38 X 2 over 26 month period.  No recurrence since that time.  . Thoracic aortic aneurysm ([place])    Incidentally discovered, 4.1 cm-->Dr. Cyndia Bent. Rpt CT 08/2018 and 08/2019 stable.  . Vocal cord paralysis 07/2016   Left vocal cord paralysis noted after thyroid surgery.    Past Surgical History:  Procedure Laterality Date  . CARDIAC CATHETERIZATION  1980  . COLONOSCOPY W/ POLYPECTOMY  07/02/2009; 01/24/20   2011 adenoma.  2021 adenomas x 3->recall 2026  . CYSTOSCOPY WITH RETROGRADE PYELOGRAM, URETEROSCOPY AND  STENT PLACEMENT Right 05/09/2018   Procedure: CYSTOSCOPY WITH RIGHT RETROGRADE RIGHT URETEROSCOPY STONE EXTRACTION RIGHT STENT EXCHANGE;  Surgeon: Lucas Mallow, MD;  Location: [facility];  Service: Urology;  Laterality: Right;  . CYSTOSCOPY/RETROGRADE/URETEROSCOPY/STONE EXTRACTION WITH BASKET Right 04/30/2018   Procedure: CYSTOSCOPY/RETROGRADE/RIGHT URETEROSCOPY/;  Surgeon: Lucas Mallow, MD;  Location: [facility];  Service: Urology;  Laterality: Right;  . ESOPHAGOGASTRODUODENOSCOPY  07/02/2009   NORMAL  . EYE SURGERY    . finger amputaion     with reattachement  . INCISION AND DRAINAGE ABSCESS ANAL  11/23/2019   gen surg (In ED).  [name] LASIK    . THYROIDECTOMY N/A 08/03/2016   Procedure: Total THYROIDECTOMY;  Surgeon: [name], MD;  Location: [facility];  Service: ENT;  Laterality: N/A;  Total Thyroidectomy   . UPPER GASTROINTESTINAL ENDOSCOPY      Outpatient Medications Prior to Visit  Medication Sig Dispense Refill  . amLODipine (NORVASC) 10 MG tablet TAKE 1 TABLET BY MOUTH  DAILY 30 tablet 0  . aspirin 81 MG tablet Take 81 mg by mouth daily.    . Blood Glucose Monitoring Suppl (ONE TOUCH ULTRA 2) w/Device KIT USE TO CHECK BLOOD SUGAR  ONCE DAILY 1 kit 0  . cloNIDine (CATAPRES) 0.3 MG tablet TAKE 1 TABLET BY MOUTH EVERY NIGHT AT BEDTIME FOR HYPERTENSION 90 tablet 1  . Continuous Blood Gluc Sensor (FREESTYLE LIBRE 2 SENSOR) MISC 2 Devices by Does not apply route every 14 (fourteen) days. 2 each 3  . empagliflozin (JARDIANCE) 25 MG TABS tablet Take 1 tablet (25 mg total) by mouth daily before breakfast. 30 tablet 2  . Evolocumab (REPATHA SURECLICK) 096 MG/ML SOAJ Inject 1 Dose into the skin every 14 (fourteen) days. 6 mL 3  . glucose blood (ONETOUCH ULTRA) test strip USE TO CHECK BLOOD SUGAR  ONCE DAILY (Patient taking differently: USE TO CHECK BLOOD SUGAR  ONCE DAILY) 100 strip 3  . insulin aspart (NOVOLOG FLEXPEN) 100 UNIT/ML FlexPen upto 12 units before meals tid 15 mL 1  . insulin  glargine (LANTUS SOLOSTAR) 100 UNIT/ML Solostar Pen Give up to 65 units SQ qhs. Patient will be up-titrating this med dose as needed to get glucoses into normal range. 15 mL 1  . insulin lispro (HUMALOG KWIKPEN) 100 UNIT/ML KwikPen upto 12 units before meals tid 15 mL 1  . Insulin Pen Needle 31G X 5 MM MISC Use with insulin pen 3 times a day 100 each 1  . Lancets (ONETOUCH DELICA PLUS [id number]) MISC USE TO CHECK BLOOD SUGAR  ONCE DAILY 100 each 3  . levothyroxine (SYNTHROID) 137 MCG tablet TAKE 1 TABLET BY MOUTH  DAILY BEFORE BREAKFAST 90 tablet 3  . loratadine (CLARITIN) 10 MG tablet Take 10 mg by mouth daily as needed for allergies.    [name]  ramipril (ALTACE) 10 MG capsule TAKE 1 CAPSULE BY MOUTH  TWICE DAILY 180 capsule 1  . senna-docusate (SENOKOT-S) 8.6-50 MG tablet Take 1 tablet by mouth 2 (two) times daily. 60 tablet 0  . metFORMIN (GLUCOPHAGE) 500 MG tablet TAKE 1 TABLET BY MOUTH  TWICE DAILY WITH MEALS (Patient not taking: Reported on 06/12/2020) 180 tablet 3   No facility-administered medications prior to visit.    Allergies  Allergen Reactions  . Atenolol     Severe "slowing"of functioning Severe "slowing"of functioning  . Hydroxyzine Other (See Comments)    "felt like I was coming out of my skin"  . Tetracycline     Rash Because of a history of documented adverse serious drug reaction;Medi Alert bracelet  is recommended Rash Because of a history of documented adverse serious drug reaction;Medi Alert braceletis recommended  . Atorvastatin     D/Ced by him due to Myalgias in Sept 2014 D/Ced by him due to Myalgias in Sept 2014  . Codeine     Hallucinations. "I see pink elephants"  . Guaifenesin     Other reaction(s): Other (See Comments) unknown unknown  . Mucinex [Guaifenesin Er] Other (See Comments)    Hallucinations "I see pink elephants"    ROS As per HPI  PE: Vitals with BMI 06/12/2020 04/24/2020 04/10/2020  Height - _0  _1   Weight 193 lbs 6 oz 195 lbs 200  lbs 3 oz  BMI - 03.47 42.59  Systolic 563 875 643  Diastolic 83 90 80  Pulse 72 78 71     Gen: Alert, well appearing.  Patient is oriented to person, place, time, and situation. AFFECT: pleasant, lucid thought and speech. CV: RRR, no m/r/g.   LUNGS: CTA bilat, nonlabored resps, good aeration in all lung fields. ABD: soft, mild voluntary guarding, VERY TTP all over abd and pt says worse bilat lower quadrants, mild abd distention, BS present but hypoactive.  Due to pt's level of tenderness I was unable to palpate deep enough to discern any hepatospenomegaly or mass.  No bruits.   LABS:  Lab Results  Component Value Date   TSH 1.13 04/02/2020   Lab Results  Component Value Date   WBC 6.3 11/23/2019   HGB 13.3 11/23/2019   HCT 38.5 (L) 11/23/2019   MCV 91.2 11/23/2019   PLT 252 11/23/2019   Lab Results  Component Value Date   CREATININE 1.12 04/17/2020   BUN 19 04/17/2020   NA 144 04/17/2020   K 4.6 04/17/2020   CL 109 04/17/2020   CO2 26 04/17/2020   Lab Results  Component Value Date   ALT 17 11/23/2019   AST 22 11/23/2019   ALKPHOS 76 11/23/2019   BILITOT 1.3 (H) 11/23/2019   Lab Results  Component Value Date   CHOL 164 04/10/2020   Lab Results  Component Value Date   HDL 34 (L) 04/10/2020   Lab Results  Component Value Date   LDLCALC 116 (H) 04/10/2020   Lab Results  Component Value Date   TRIG 72 04/10/2020   Lab Results  Component Value Date   CHOLHDL 4.8 04/10/2020   Lab Results  Component Value Date   PSA 0.73 10/30/2019   PSA 1.68 06/19/2018   PSA 1.99 04/30/2018   Lab Results  Component Value Date   HGBA1C 8.5 (H) 01/29/2020   Lab Results  Component Value Date   ESRSEDRATE 15 11/22/2019   Lab Results  Component Value Date   CRP <1.0 11/22/2019  Lab Results  Component Value Date   LIPASE 29 11/23/2019     IMPRESSION AND PLAN:  1) Acute on chronic abdominal pain: suspect multifactorial-->severe constipation/obstipation,  possible diabetic gastroparesis, possible pancreatitis, possible cholecystitis, possible humalog side effect but felt to be much less likely. Checking lipase, cbc, cmet stat today and ordering stat CT abd/pelv to r/o obstruction. He had a reassuring colonoscopy 01/2020 by Dr. Carlean Purl and CT abd/pelv 11/2019 showed mild peripancreatic inflamm changes as well as mild ductal dilatation--but this did not correlate with exam AND lipase was normal.  I do think he needs to see Dr. Carlean Purl again for further eval, esp if his CT ends up showing only constipation.  May need gastric emptying scan. I gave him Dr. Celesta Aver office # to call for appt but depending on when they get him scheduled I may need to see if they'll work him in earlier.  I recommended he go ahead and try an enema. Will wait on trial of linzess or amitiza at this time.  Spent 45 min with pt today reviewing HPI, reviewing relevant past history, doing exam, reviewing and discussing lab and imaging data, and formulating plans.  An After Visit Summary was printed and given to the patient.  FOLLOW UP:  To be determined based on results of w/u. Signed:  Crissie Sickles, MD           06/12/2020

## 2020-06-12 NOTE — Telephone Encounter (Signed)
Please advise if ok to place referral. Pt seen in office today.

## 2020-06-12 NOTE — Telephone Encounter (Signed)
Patient needs referral to new Endo doctor.  Patient would like to be referred to  Dr. Mee Hives @ [facility] in [place] office.   [facility] [address] [place], [place] [address] Office: [phone]  Fax: [phone]

## 2020-06-14 ENCOUNTER — Inpatient Hospital Stay (HOSPITAL_BASED_OUTPATIENT_CLINIC_OR_DEPARTMENT_OTHER)
Admission: EM | Admit: 2020-06-14 | Discharge: 2020-06-22 | DRG: 423 | Disposition: A | Discharge status: Home / Self Care | Payer: 59 | Attending: Internal Medicine | Admitting: Internal Medicine

## 2020-06-14 ENCOUNTER — Other Ambulatory Visit: Payer: Self-pay

## 2020-06-14 ENCOUNTER — Encounter (HOSPITAL_BASED_OUTPATIENT_CLINIC_OR_DEPARTMENT_OTHER): Payer: Self-pay | Admitting: Emergency Medicine

## 2020-06-14 ENCOUNTER — Emergency Department (HOSPITAL_BASED_OUTPATIENT_CLINIC_OR_DEPARTMENT_OTHER): Payer: 59

## 2020-06-14 DIAGNOSIS — C259 Malignant neoplasm of pancreas, unspecified: Secondary | ICD-10-CM

## 2020-06-14 DIAGNOSIS — J3800 Paralysis of vocal cords and larynx, unspecified: Secondary | ICD-10-CM

## 2020-06-14 DIAGNOSIS — Z8616 Personal history of COVID-19: Secondary | ICD-10-CM

## 2020-06-14 DIAGNOSIS — Z7982 Long term (current) use of aspirin: Secondary | ICD-10-CM

## 2020-06-14 DIAGNOSIS — Z818 Family history of other mental and behavioral disorders: Secondary | ICD-10-CM

## 2020-06-14 DIAGNOSIS — K859 Acute pancreatitis without necrosis or infection, unspecified: Secondary | ICD-10-CM | POA: Diagnosis present

## 2020-06-14 DIAGNOSIS — E049 Nontoxic goiter, unspecified: Secondary | ICD-10-CM | POA: Diagnosis present

## 2020-06-14 DIAGNOSIS — Z833 Family history of diabetes mellitus: Secondary | ICD-10-CM

## 2020-06-14 DIAGNOSIS — R110 Nausea: Secondary | ICD-10-CM

## 2020-06-14 DIAGNOSIS — K319 Disease of stomach and duodenum, unspecified: Secondary | ICD-10-CM | POA: Diagnosis present

## 2020-06-14 DIAGNOSIS — Z888 Allergy status to other drugs, medicaments and biological substances status: Secondary | ICD-10-CM

## 2020-06-14 DIAGNOSIS — E039 Hypothyroidism, unspecified: Secondary | ICD-10-CM | POA: Diagnosis present

## 2020-06-14 DIAGNOSIS — E040 Nontoxic diffuse goiter: Secondary | ICD-10-CM | POA: Diagnosis present

## 2020-06-14 DIAGNOSIS — I712 Thoracic aortic aneurysm, without rupture: Secondary | ICD-10-CM | POA: Diagnosis present

## 2020-06-14 DIAGNOSIS — Z794 Long term (current) use of insulin: Secondary | ICD-10-CM

## 2020-06-14 DIAGNOSIS — Z79899 Other long term (current) drug therapy: Secondary | ICD-10-CM

## 2020-06-14 DIAGNOSIS — F419 Anxiety disorder, unspecified: Secondary | ICD-10-CM | POA: Diagnosis present

## 2020-06-14 DIAGNOSIS — Z87442 Personal history of urinary calculi: Secondary | ICD-10-CM

## 2020-06-14 DIAGNOSIS — I2510 Atherosclerotic heart disease of native coronary artery without angina pectoris: Secondary | ICD-10-CM | POA: Diagnosis present

## 2020-06-14 DIAGNOSIS — E1165 Type 2 diabetes mellitus with hyperglycemia: Secondary | ICD-10-CM | POA: Diagnosis not present

## 2020-06-14 DIAGNOSIS — Z8673 Personal history of transient ischemic attack (TIA), and cerebral infarction without residual deficits: Secondary | ICD-10-CM

## 2020-06-14 DIAGNOSIS — I10 Essential (primary) hypertension: Secondary | ICD-10-CM | POA: Diagnosis present

## 2020-06-14 DIAGNOSIS — Z83438 Family history of other disorder of lipoprotein metabolism and other lipidemia: Secondary | ICD-10-CM

## 2020-06-14 DIAGNOSIS — Z885 Allergy status to narcotic agent status: Secondary | ICD-10-CM

## 2020-06-14 DIAGNOSIS — Z8261 Family history of arthritis: Secondary | ICD-10-CM

## 2020-06-14 DIAGNOSIS — Z20822 Contact with and (suspected) exposure to covid-19: Secondary | ICD-10-CM | POA: Diagnosis present

## 2020-06-14 DIAGNOSIS — Z86010 Personal history of colonic polyps: Secondary | ICD-10-CM

## 2020-06-14 DIAGNOSIS — Z79890 Hormone replacement therapy (postmenopausal): Secondary | ICD-10-CM

## 2020-06-14 DIAGNOSIS — K5900 Constipation, unspecified: Secondary | ICD-10-CM | POA: Diagnosis present

## 2020-06-14 DIAGNOSIS — Z8249 Family history of ischemic heart disease and other diseases of the circulatory system: Secondary | ICD-10-CM

## 2020-06-14 DIAGNOSIS — K8689 Other specified diseases of pancreas: Secondary | ICD-10-CM

## 2020-06-14 DIAGNOSIS — R1013 Epigastric pain: Secondary | ICD-10-CM

## 2020-06-14 DIAGNOSIS — Z825 Family history of asthma and other chronic lower respiratory diseases: Secondary | ICD-10-CM

## 2020-06-14 DIAGNOSIS — I252 Old myocardial infarction: Secondary | ICD-10-CM

## 2020-06-14 DIAGNOSIS — R933 Abnormal findings on diagnostic imaging of other parts of digestive tract: Secondary | ICD-10-CM

## 2020-06-14 DIAGNOSIS — Z808 Family history of malignant neoplasm of other organs or systems: Secondary | ICD-10-CM

## 2020-06-14 DIAGNOSIS — E119 Type 2 diabetes mellitus without complications: Secondary | ICD-10-CM

## 2020-06-14 DIAGNOSIS — M069 Rheumatoid arthritis, unspecified: Secondary | ICD-10-CM | POA: Diagnosis present

## 2020-06-14 DIAGNOSIS — E785 Hyperlipidemia, unspecified: Secondary | ICD-10-CM | POA: Diagnosis present

## 2020-06-14 DIAGNOSIS — J3801 Paralysis of vocal cords and larynx, unilateral: Secondary | ICD-10-CM | POA: Diagnosis present

## 2020-06-14 DIAGNOSIS — E782 Mixed hyperlipidemia: Secondary | ICD-10-CM | POA: Diagnosis present

## 2020-06-14 DIAGNOSIS — F17290 Nicotine dependence, other tobacco product, uncomplicated: Secondary | ICD-10-CM | POA: Diagnosis present

## 2020-06-14 HISTORY — DX: Malignant neoplasm of pancreas, unspecified: C25.9

## 2020-06-14 HISTORY — DX: Other specified diseases of pancreas: K86.89

## 2020-06-14 LAB — URINALYSIS, ROUTINE W REFLEX MICROSCOPIC
Bilirubin Urine: NEGATIVE
Glucose, UA: NEGATIVE mg/dL
Hgb urine dipstick: NEGATIVE
Ketones, ur: NEGATIVE mg/dL
Leukocytes,Ua: NEGATIVE
Nitrite: NEGATIVE
Protein, ur: NEGATIVE mg/dL
Specific Gravity, Urine: 1.01 (ref 1.005–1.030)
pH: 6 (ref 5.0–8.0)

## 2020-06-14 LAB — CBC WITH DIFFERENTIAL/PLATELET
Abs Immature Granulocytes: 0.02 10*3/uL (ref 0.00–0.07)
Basophils Absolute: 0 10*3/uL (ref 0.0–0.1)
Basophils Relative: 1 %
Eosinophils Absolute: 0.1 10*3/uL (ref 0.0–0.5)
Eosinophils Relative: 1 %
HCT: 40.5 % (ref 39.0–52.0)
Hemoglobin: 14 g/dL (ref 13.0–17.0)
Immature Granulocytes: 0 %
Lymphocytes Relative: 23 %
Lymphs Abs: 1.5 10*3/uL (ref 0.7–4.0)
MCH: 30.6 pg (ref 26.0–34.0)
MCHC: 34.6 g/dL (ref 30.0–36.0)
MCV: 88.6 fL (ref 80.0–100.0)
Monocytes Absolute: 0.4 10*3/uL (ref 0.1–1.0)
Monocytes Relative: 6 %
Neutro Abs: 4.5 10*3/uL (ref 1.7–7.7)
Neutrophils Relative %: 69 %
Platelets: 199 10*3/uL (ref 150–400)
RBC: 4.57 MIL/uL (ref 4.22–5.81)
RDW: 12.1 % (ref 11.5–15.5)
WBC: 6.5 10*3/uL (ref 4.0–10.5)
nRBC: 0 % (ref 0.0–0.2)

## 2020-06-14 LAB — COMPREHENSIVE METABOLIC PANEL
ALT: 14 U/L (ref 0–44)
AST: 15 U/L (ref 15–41)
Albumin: 4.1 g/dL (ref 3.5–5.0)
Alkaline Phosphatase: 75 U/L (ref 38–126)
Anion gap: 11 (ref 5–15)
BUN: 17 mg/dL (ref 8–23)
CO2: 23 mmol/L (ref 22–32)
Calcium: 9.7 mg/dL (ref 8.9–10.3)
Chloride: 104 mmol/L (ref 98–111)
Creatinine, Ser: 1.12 mg/dL (ref 0.61–1.24)
GFR, Estimated: >60 mL/min (ref >60)
Glucose, Bld: 114 mg/dL — ABNORMAL HIGH (ref 70–99)
Potassium: 3.9 mmol/L (ref 3.5–5.1)
Sodium: 138 mmol/L (ref 135–145)
Total Bilirubin: 0.2 mg/dL — ABNORMAL LOW (ref 0.3–1.2)
Total Protein: 7.3 g/dL (ref 6.5–8.1)

## 2020-06-14 LAB — LIPASE, BLOOD: Lipase: 66 U/L — ABNORMAL HIGH (ref 11–51)

## 2020-06-14 LAB — LACTIC ACID, PLASMA: Lactic Acid, Venous: 1 mmol/L (ref 0.5–1.9)

## 2020-06-14 MED ORDER — MORPHINE SULFATE (PF) 4 MG/ML IV SOLN
4.0000 mg | Freq: Once | INTRAVENOUS | Status: AC
  Administered 2020-06-14: 4 mg via INTRAVENOUS
  Filled 2020-06-14: qty 1

## 2020-06-14 MED ORDER — IOHEXOL 300 MG/ML  SOLN
100.0000 mL | Freq: Once | INTRAMUSCULAR | Status: AC
  Administered 2020-06-14: 100 mL via INTRAVENOUS

## 2020-06-14 MED ORDER — LACTATED RINGERS IV BOLUS
1000.0000 mL | Freq: Once | INTRAVENOUS | Status: AC
  Administered 2020-06-14: 1000 mL via INTRAVENOUS

## 2020-06-14 MED ORDER — LACTATED RINGERS IV SOLN: INTRAVENOUS | Status: DC

## 2020-06-14 MED ORDER — ONDANSETRON HCL 4 MG/2ML IJ SOLN
4.0000 mg | Freq: Once | INTRAMUSCULAR | Status: AC
  Administered 2020-06-14: 4 mg via INTRAVENOUS
  Filled 2020-06-14: qty 2

## 2020-06-14 NOTE — ED Provider Notes (Signed)
[facility] Provider Note   CSN: [id number] Arrival date & time: 06/14/20  1802     History Chief Complaint  Patient presents with  . Flank Pain  . Chest Pain    Timothy Page is a 65 y.o. male.  Patient is having severe diffuse abdominal pain.  He points to his left flank where the most severity is but it still hurts on the right as well.  Nausea.  Has tried no pain medication.  Has had no abdominal surgeries.  No recent illness.  No vomiting.  No diarrhea.  Denies chest pain specifically.  Denies shortness of breath.   Chest Pain Associated symptoms: abdominal pain and nausea   Associated symptoms: no back pain, no cough, no fever, no headache, no palpitations, no shortness of breath and no vomiting        Past Medical History:  Diagnosis Date  . Arthritis   . Atypical chest pain 04/2017   ACS ruled out 05/11/17  . CAP (community acquired pneumonia) 05/07/2017  . Diabetes mellitus without complication ([place])    Poor control starting fall 2021-->GAD ab neg.  Insulin and C peptide levels wnl but on lower end of normal->Dr Kuma/endo 2022.  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 Hx of adenomatous polyp of colon 07/02/2009   rpt colonoscopy 2026  . Hyperlipidemia    Intol of multiple statins and zetia. [facility] 2022.  [name] Hypertension   . Lipoma    back  . Myocardial infarction ([place])    MI at age 83  . Perianal abscess 11/2019   I&D'd by Dr.Thompson, gen surg, in the ED 11/23/19  . 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 Cammy Copa  . Right ureteral calculus 04/2018   Cystoscopy with right retrograde pyelogram and right ureteral stent placement after stone removal.  . Seasonal allergies   . Stroke ([place])    in 16O no complications   . Tachyarrhythmia 1980   age 22 X 2 over 56 month period.  No recurrence since that time.  . Thoracic aortic aneurysm ([place])     Incidentally discovered, 4.1 cm-->Dr. Cyndia Bent. Rpt CT 08/2018 and 08/2019 stable.  . Vocal cord paralysis 07/2016   Left vocal cord paralysis noted after thyroid surgery.    Patient Active Problem List   Diagnosis Date Noted  . Acute pancreatitis 06/14/2020  . Obesity (BMI 30.0-34.9) 08/31/2018  . Adjustment disorder with mixed anxiety and depressed mood   . Chest pain 05/11/2017  . Anxiety about health 05/11/2017  . Community acquired pneumonia 05/07/2017  . Lung nodule   . Abdominal aortic aneurysm (AAA) without rupture ([place])   . Hyperlipidemia   . 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 Vocal cord paralysis 08/12/2016  . Goiter 07/31/2016  . Cough 03/06/2015  . Controlled type 2 diabetes mellitus without complication, without long-term current use of insulin ([place]) 08/09/2012  . Essential hypertension 08/07/2012  . Mixed hyperlipidemia 08/07/2012  . CAD (coronary artery disease) 04/28/2011  . Unspecified adverse effect of unspecified drug, medicinal and biological substance 04/28/2011  . Lipoma 03/01/2011  . Tremor 03/01/2011  . Hx of adenomatous polyp of colon 07/02/2009  . UNSPECIFIED PROSTATITIS 05/05/2009    Past Surgical History:  Procedure Laterality Date  . CARDIAC CATHETERIZATION  1980  . COLONOSCOPY W/ POLYPECTOMY  07/02/2009; 01/24/20   2011 adenoma.  2021 adenomas x 3->recall 2026  . CYSTOSCOPY WITH  RETROGRADE PYELOGRAM, URETEROSCOPY AND STENT PLACEMENT Right 05/09/2018   Procedure: CYSTOSCOPY WITH RIGHT RETROGRADE RIGHT URETEROSCOPY STONE EXTRACTION RIGHT STENT EXCHANGE;  Surgeon: Lucas Mallow, MD;  Location: [facility];  Service: Urology;  Laterality: Right;  . CYSTOSCOPY/RETROGRADE/URETEROSCOPY/STONE EXTRACTION WITH BASKET Right 04/30/2018   Procedure: CYSTOSCOPY/RETROGRADE/RIGHT URETEROSCOPY/;  Surgeon: Lucas Mallow, MD;  Location: [facility];  Service: Urology;  Laterality: Right;  . ESOPHAGOGASTRODUODENOSCOPY  07/02/2009   NORMAL  .  EYE SURGERY    . finger amputaion     with reattachement  . INCISION AND DRAINAGE ABSCESS ANAL  11/23/2019   gen surg (In ED).  [name] LASIK    . THYROIDECTOMY N/A 08/03/2016   Procedure: Total THYROIDECTOMY;  Surgeon: [name], MD;  Location: [facility];  Service: ENT;  Laterality: N/A;  Total Thyroidectomy   . UPPER GASTROINTESTINAL ENDOSCOPY         Family History  Problem Relation Age of Onset  . Diabetes Mother   . Cancer Mother        lung  . COPD Mother   . Brain cancer Mother   . Arthritis Mother   . Hyperlipidemia Mother   . Heart disease Mother   . Hypertension Mother   . Diabetes Father   . Cancer Father        mesothelioma; skin  . Mental illness Father   . Arthritis Father   . Hyperlipidemia Father   . Heart disease Father   . Hypertension Father   . Heart disease Maternal Grandmother        MI @ 58  . Heart disease Maternal Grandfather        MI @ 58  . Cancer Paternal Grandmother   . Diabetes Paternal Grandmother   . Heart disease Paternal Grandfather        MI @ 17  . Thyroid cancer Maternal Aunt   . Colon cancer Neg Hx   . Esophageal cancer Neg Hx   . Rectal cancer Neg Hx   . Stomach cancer Neg Hx     Social History   Tobacco Use  . Smoking status: Never Smoker  . Smokeless tobacco: Current User    Types: Snuff  Vaping Use  . Vaping Use: Never used  Substance Use Topics  . Alcohol use: No  . Drug use: No    Home Medications Prior to Admission medications   Medication Sig Start Date End Date Taking? Authorizing Provider  amLODipine (NORVASC) 10 MG tablet TAKE 1 TABLET BY MOUTH  DAILY 05/25/20   McGowen, Adrian Blackwater, MD  aspirin 81 MG tablet Take 81 mg by mouth daily.    [provider]  Blood Glucose Monitoring Suppl (ONE TOUCH ULTRA 2) w/Device KIT USE TO CHECK BLOOD SUGAR  ONCE DAILY 12/10/19   McGowen, Adrian Blackwater, MD  cloNIDine (CATAPRES) 0.3 MG tablet TAKE 1 TABLET BY MOUTH EVERY NIGHT AT BEDTIME FOR HYPERTENSION 06/08/20   McGowen,  Adrian Blackwater, MD  Continuous Blood Gluc Sensor (FREESTYLE LIBRE 2 SENSOR) MISC 2 Devices by Does not apply route every 14 (fourteen) days. 04/03/20   Elayne Snare, MD  empagliflozin (JARDIANCE) 25 MG TABS tablet Take 1 tablet (25 mg total) by mouth daily before breakfast. 04/03/20   Elayne Snare, MD  Evolocumab (REPATHA SURECLICK) 308 MG/ML SOAJ Inject 1 Dose into the skin every 14 (fourteen) days. 05/14/20   Hilty, Nadean Corwin, MD  glucose blood (ONETOUCH ULTRA) test strip USE TO CHECK BLOOD SUGAR  ONCE DAILY  Patient taking differently: USE TO CHECK BLOOD SUGAR  ONCE DAILY 12/03/19   McGowen, Adrian Blackwater, MD  insulin aspart (NOVOLOG FLEXPEN) 100 UNIT/ML FlexPen upto 12 units before meals tid 04/24/20   Elayne Snare, MD  insulin glargine (LANTUS SOLOSTAR) 100 UNIT/ML Solostar Pen Give up to 65 units SQ qhs. Patient will be up-titrating this med dose as needed to get glucoses into normal range. 06/08/20   McGowen, Adrian Blackwater, MD  insulin lispro (HUMALOG KWIKPEN) 100 UNIT/ML KwikPen upto 12 units before meals tid 05/18/20   Elayne Snare, MD  Insulin Pen Needle 31G X 5 MM MISC Use with insulin pen 3 times a day 04/24/20   Elayne Snare, MD  Lancets [facility] DELICA PLUS [id number]) MISC USE TO CHECK BLOOD SUGAR  ONCE DAILY 07/16/19   McGowen, Adrian Blackwater, MD  levothyroxine (SYNTHROID) 137 MCG tablet TAKE 1 TABLET BY MOUTH  DAILY BEFORE BREAKFAST 06/03/20   Elayne Snare, MD  loratadine (CLARITIN) 10 MG tablet Take 10 mg by mouth daily as needed for allergies.    [provider]  ramipril (ALTACE) 10 MG capsule TAKE 1 CAPSULE BY MOUTH  TWICE DAILY 01/16/20   McGowen, Adrian Blackwater, MD  senna-docusate (SENOKOT-S) 8.6-50 MG tablet Take 1 tablet by mouth 2 (two) times daily. 08/05/16   Elgergawy, Silver Huguenin, MD    Allergies    Atenolol, Hydroxyzine, Tetracycline, Atorvastatin, Codeine, Guaifenesin, and Mucinex [guaifenesin er]  Review of Systems   Review of Systems  Constitutional: Negative for chills and fever.  HENT: Negative for  congestion and rhinorrhea.   Respiratory: Negative for cough and shortness of breath.   Cardiovascular: Negative for chest pain and palpitations.  Gastrointestinal: Positive for abdominal pain and nausea. Negative for diarrhea and vomiting.  Genitourinary: Negative for difficulty urinating and dysuria.  Musculoskeletal: Negative for arthralgias and back pain.  Skin: Negative for color change and rash.  Neurological: Negative for light-headedness and headaches.    Physical Exam Updated Vital Signs BP (!) 176/97 (BP Location: Right Arm)   Pulse 87   Temp 98.8 F (37.1 C) (Oral)   Resp 18   Ht _0  (1.753 m)   Wt 87.5 kg   SpO2 100%   BMI 28.50 kg/m   Physical Exam Vitals and nursing note reviewed.  Constitutional:      General: He is not in acute distress.    Appearance: Normal appearance.  HENT:     Head: Normocephalic and atraumatic.     Nose: No rhinorrhea.  Eyes:     General:        Right eye: No discharge.        Left eye: No discharge.     Conjunctiva/sclera: Conjunctivae normal.  Cardiovascular:     Rate and Rhythm: Normal rate and regular rhythm.  Pulmonary:     Effort: Pulmonary effort is normal.     Breath sounds: No stridor.  Abdominal:     General: Abdomen is flat. There is no distension.     Palpations: Abdomen is soft.     Tenderness: There is abdominal tenderness (diffuse). There is rebound.  Musculoskeletal:        General: No deformity or signs of injury.  Skin:    General: Skin is warm and dry.  Neurological:     General: No focal deficit present.     Mental Status: He is alert. Mental status is at baseline.     Motor: No weakness.  Psychiatric:  Mood and Affect: Mood normal.        Behavior: Behavior normal.        Thought Content: Thought content normal.     ED Results / Procedures / Treatments   Labs (all labs ordered are listed, but only abnormal results are displayed) Labs Reviewed  COMPREHENSIVE METABOLIC PANEL - Abnormal;  Notable for the following components:      Result Value   Glucose, Bld 114 (*)    Total Bilirubin 0.2 (*)    All other components within normal limits  LIPASE, BLOOD - Abnormal; Notable for the following components:   Lipase 66 (*)    All other components within normal limits  SARS CORONAVIRUS 2 (TAT 6-24 HRS)  CBC WITH DIFFERENTIAL/PLATELET  URINALYSIS, ROUTINE W REFLEX MICROSCOPIC  LACTIC ACID, PLASMA    EKG None  Radiology CT ABDOMEN PELVIS W CONTRAST  Result Date: 06/14/2020 CLINICAL DATA:  Diffuse abdominal pain. Bilateral flank pain for 3 weeks. EXAM: CT ABDOMEN AND PELVIS WITH CONTRAST TECHNIQUE: Multidetector CT imaging of the abdomen and pelvis was performed using the standard protocol following bolus administration of intravenous contrast. CONTRAST:  172m OMNIPAQUE IOHEXOL 300 MG/ML  SOLN COMPARISON:  11/22/2019 FINDINGS: Lower chest: The lung bases are clear. The heart size is normal. Hepatobiliary: The liver is normal. Normal gallbladder.There is no biliary ductal dilation. Pancreas: There are inflammatory changes about the pancreas. There is pancreatic ductal dilatation with a possible abrupt cutoff in the distal pancreatic body. There is fullness of the pancreatic head with a heterogeneous appearance and areas of decreased attenuation. Spleen: Unremarkable. Adrenals/Urinary Tract: --Adrenal glands: Unremarkable. --Right kidney/ureter: No hydronephrosis or radiopaque kidney stones. --Left kidney/ureter: No hydronephrosis or radiopaque kidney stones. --Urinary bladder: Unremarkable. Stomach/Bowel: --Stomach/Duodenum: No hiatal hernia or other gastric abnormality. Normal duodenal course and caliber. --Small bowel: Unremarkable. --Colon: Unremarkable. --Appendix: Normal. Vascular/Lymphatic: Atherosclerotic calcification is present within the non-aneurysmal abdominal aorta, without hemodynamically significant stenosis. --No retroperitoneal lymphadenopathy. --No mesenteric  lymphadenopathy. --No pelvic or inguinal lymphadenopathy. Reproductive: Unremarkable Other: No ascites or free air. The abdominal wall is normal. Musculoskeletal. No acute displaced fractures. IMPRESSION: 1. Abnormal appearance of the pancreas with adjacent fat stranding, pancreatic ductal dilatation, and fullness of the pancreatic head and distal pancreatic body. Findings may be secondary to acute pancreatitis. However, the fullness of the pancreatic head and pancreatic ductal dilatation is suspicious for an underlying obstructing lesion. As such, follow-up with a nonemergent contrast enhanced MRI is recommended for further evaluation of this finding. 2. No other potentially acute abnormality detected in the abdomen or pelvis. Electronically Signed   By: CConstance HolsterM.D.   On: 06/14/2020 20:19    Procedures Procedures   Medications Ordered in ED Medications  lactated ringers infusion (has no administration in time range)  lactated ringers bolus 1,000 mL ( Intravenous Stopped 06/14/20 2041)  morphine 4 MG/ML injection 4 mg (4 mg Intravenous Given 06/14/20 1904)  ondansetron (ZOFRAN) injection 4 mg (4 mg Intravenous Given 06/14/20 1904)  iohexol (OMNIPAQUE) 300 MG/ML solution 100 mL (100 mLs Intravenous Contrast Given 06/14/20 1918)  morphine 4 MG/ML injection 4 mg (4 mg Intravenous Given 06/14/20 2152)    ED Course  I have reviewed the triage vital signs and the nursing notes.  Pertinent labs & imaging results that were available during my care of the patient were reviewed by me and considered in my medical decision making (see chart for details).    MDM Rules/Calculators/A&P  Severe abdominal pain.  Will need advanced imaging will need laboratory screen will get pain control will give fluids.  No focal source of pain at this point.  EKG was done shows sinus rhythm with no acute ischemic change or interval abnormality or arrhythmia.  Patient's laboratory studies show  mildly elevated lipase.  Otherwise fairly unremarkable.  He got CT imaging that showed pancreatic inflammation with an abnormality in the head that may need further work-up.  I consulted medicine for further evaluation.  Patient agrees to admission for further diagnostic testing pain control and bowel rest.  Final Clinical Impression(s) / ED Diagnoses Final diagnoses:  Acute pancreatitis, unspecified complication status, unspecified pancreatitis type    Rx / DC Orders ED Discharge Orders    None       Breck Coons, MD 06/14/20 2351

## 2020-06-14 NOTE — ED Triage Notes (Signed)
Bil flank pain for the last three weeks.  Denies any urinary symptoms.  Does endorse some pain that shoots into the center of the chest that feels like gas pain.  Seen at PCP this week. Reports he is scheduled for ct here but he couldn't wait.  Does endorse some constipation but had a large bm today.

## 2020-06-14 NOTE — ED Notes (Signed)
Pt was given a urinal and told to call when he has a urine sample.

## 2020-06-15 ENCOUNTER — Inpatient Hospital Stay (HOSPITAL_COMMUNITY): Payer: 59

## 2020-06-15 DIAGNOSIS — R110 Nausea: Secondary | ICD-10-CM

## 2020-06-15 DIAGNOSIS — Z79890 Hormone replacement therapy (postmenopausal): Secondary | ICD-10-CM | POA: Diagnosis not present

## 2020-06-15 DIAGNOSIS — Z8249 Family history of ischemic heart disease and other diseases of the circulatory system: Secondary | ICD-10-CM | POA: Diagnosis not present

## 2020-06-15 DIAGNOSIS — E039 Hypothyroidism, unspecified: Secondary | ICD-10-CM

## 2020-06-15 DIAGNOSIS — E040 Nontoxic diffuse goiter: Secondary | ICD-10-CM | POA: Diagnosis not present

## 2020-06-15 DIAGNOSIS — K859 Acute pancreatitis without necrosis or infection, unspecified: Secondary | ICD-10-CM | POA: Diagnosis present

## 2020-06-15 DIAGNOSIS — Z86010 Personal history of colonic polyps: Secondary | ICD-10-CM | POA: Diagnosis not present

## 2020-06-15 DIAGNOSIS — R634 Abnormal weight loss: Secondary | ICD-10-CM | POA: Diagnosis not present

## 2020-06-15 DIAGNOSIS — Z7982 Long term (current) use of aspirin: Secondary | ICD-10-CM | POA: Diagnosis not present

## 2020-06-15 DIAGNOSIS — K8689 Other specified diseases of pancreas: Secondary | ICD-10-CM | POA: Diagnosis not present

## 2020-06-15 DIAGNOSIS — Z808 Family history of malignant neoplasm of other organs or systems: Secondary | ICD-10-CM | POA: Diagnosis not present

## 2020-06-15 DIAGNOSIS — R1013 Epigastric pain: Secondary | ICD-10-CM

## 2020-06-15 DIAGNOSIS — I252 Old myocardial infarction: Secondary | ICD-10-CM | POA: Diagnosis not present

## 2020-06-15 DIAGNOSIS — Z79899 Other long term (current) drug therapy: Secondary | ICD-10-CM | POA: Diagnosis not present

## 2020-06-15 DIAGNOSIS — F17290 Nicotine dependence, other tobacco product, uncomplicated: Secondary | ICD-10-CM | POA: Diagnosis present

## 2020-06-15 DIAGNOSIS — E785 Hyperlipidemia, unspecified: Secondary | ICD-10-CM

## 2020-06-15 DIAGNOSIS — R1084 Generalized abdominal pain: Secondary | ICD-10-CM | POA: Diagnosis not present

## 2020-06-15 DIAGNOSIS — Z794 Long term (current) use of insulin: Secondary | ICD-10-CM | POA: Diagnosis not present

## 2020-06-15 DIAGNOSIS — Z8261 Family history of arthritis: Secondary | ICD-10-CM | POA: Diagnosis not present

## 2020-06-15 DIAGNOSIS — Z20822 Contact with and (suspected) exposure to covid-19: Secondary | ICD-10-CM | POA: Diagnosis present

## 2020-06-15 DIAGNOSIS — I2510 Atherosclerotic heart disease of native coronary artery without angina pectoris: Secondary | ICD-10-CM | POA: Diagnosis present

## 2020-06-15 DIAGNOSIS — E1165 Type 2 diabetes mellitus with hyperglycemia: Secondary | ICD-10-CM | POA: Diagnosis not present

## 2020-06-15 DIAGNOSIS — J3800 Paralysis of vocal cords and larynx, unspecified: Secondary | ICD-10-CM

## 2020-06-15 DIAGNOSIS — E119 Type 2 diabetes mellitus without complications: Secondary | ICD-10-CM | POA: Diagnosis not present

## 2020-06-15 DIAGNOSIS — E440 Moderate protein-calorie malnutrition: Secondary | ICD-10-CM | POA: Diagnosis not present

## 2020-06-15 DIAGNOSIS — R933 Abnormal findings on diagnostic imaging of other parts of digestive tract: Secondary | ICD-10-CM | POA: Diagnosis not present

## 2020-06-15 DIAGNOSIS — I10 Essential (primary) hypertension: Secondary | ICD-10-CM | POA: Diagnosis present

## 2020-06-15 DIAGNOSIS — C259 Malignant neoplasm of pancreas, unspecified: Secondary | ICD-10-CM | POA: Diagnosis present

## 2020-06-15 DIAGNOSIS — C250 Malignant neoplasm of head of pancreas: Secondary | ICD-10-CM | POA: Diagnosis not present

## 2020-06-15 DIAGNOSIS — Z83438 Family history of other disorder of lipoprotein metabolism and other lipidemia: Secondary | ICD-10-CM | POA: Diagnosis not present

## 2020-06-15 DIAGNOSIS — Z818 Family history of other mental and behavioral disorders: Secondary | ICD-10-CM | POA: Diagnosis not present

## 2020-06-15 DIAGNOSIS — E782 Mixed hyperlipidemia: Secondary | ICD-10-CM | POA: Diagnosis present

## 2020-06-15 DIAGNOSIS — Z885 Allergy status to narcotic agent status: Secondary | ICD-10-CM | POA: Diagnosis not present

## 2020-06-15 DIAGNOSIS — Z8673 Personal history of transient ischemic attack (TIA), and cerebral infarction without residual deficits: Secondary | ICD-10-CM | POA: Diagnosis not present

## 2020-06-15 DIAGNOSIS — J3801 Paralysis of vocal cords and larynx, unilateral: Secondary | ICD-10-CM | POA: Diagnosis present

## 2020-06-15 DIAGNOSIS — Z888 Allergy status to other drugs, medicaments and biological substances status: Secondary | ICD-10-CM | POA: Diagnosis not present

## 2020-06-15 LAB — CBC
HCT: 38.2 % — ABNORMAL LOW (ref 39.0–52.0)
Hemoglobin: 13.3 g/dL (ref 13.0–17.0)
MCH: 30.8 pg (ref 26.0–34.0)
MCHC: 34.8 g/dL (ref 30.0–36.0)
MCV: 88.4 fL (ref 80.0–100.0)
Platelets: 170 10*3/uL (ref 150–400)
RBC: 4.32 MIL/uL (ref 4.22–5.81)
RDW: 12.1 % (ref 11.5–15.5)
WBC: 6 10*3/uL (ref 4.0–10.5)
nRBC: 0 % (ref 0.0–0.2)

## 2020-06-15 LAB — LIPID PANEL
Cholesterol: 148 mg/dL (ref 0–200)
HDL: 34 mg/dL — ABNORMAL LOW (ref >40)
LDL Cholesterol: 103 mg/dL — ABNORMAL HIGH (ref 0–99)
Total CHOL/HDL Ratio: 4.4 RATIO
Triglycerides: 55 mg/dL (ref <150)
VLDL: 11 mg/dL (ref 0–40)

## 2020-06-15 LAB — COMPREHENSIVE METABOLIC PANEL
ALT: 13 U/L (ref 0–44)
AST: 13 U/L — ABNORMAL LOW (ref 15–41)
Albumin: 3.7 g/dL (ref 3.5–5.0)
Alkaline Phosphatase: 76 U/L (ref 38–126)
Anion gap: 8 (ref 5–15)
BUN: 11 mg/dL (ref 8–23)
CO2: 27 mmol/L (ref 22–32)
Calcium: 9.5 mg/dL (ref 8.9–10.3)
Chloride: 104 mmol/L (ref 98–111)
Creatinine, Ser: 1.04 mg/dL (ref 0.61–1.24)
GFR, Estimated: >60 mL/min (ref >60)
Glucose, Bld: 122 mg/dL — ABNORMAL HIGH (ref 70–99)
Potassium: 3.3 mmol/L — ABNORMAL LOW (ref 3.5–5.1)
Sodium: 139 mmol/L (ref 135–145)
Total Bilirubin: 0.9 mg/dL (ref 0.3–1.2)
Total Protein: 6.4 g/dL — ABNORMAL LOW (ref 6.5–8.1)

## 2020-06-15 LAB — GLUCOSE, CAPILLARY
Glucose-Capillary: 105 mg/dL — ABNORMAL HIGH (ref 70–99)
Glucose-Capillary: 113 mg/dL — ABNORMAL HIGH (ref 70–99)
Glucose-Capillary: 122 mg/dL — ABNORMAL HIGH (ref 70–99)
Glucose-Capillary: 123 mg/dL — ABNORMAL HIGH (ref 70–99)
Glucose-Capillary: 138 mg/dL — ABNORMAL HIGH (ref 70–99)
Glucose-Capillary: 95 mg/dL (ref 70–99)

## 2020-06-15 LAB — SARS CORONAVIRUS 2 (TAT 6-24 HRS): SARS Coronavirus 2: NEGATIVE

## 2020-06-15 LAB — MAGNESIUM: Magnesium: 1.9 mg/dL (ref 1.7–2.4)

## 2020-06-15 LAB — HIV ANTIBODY (ROUTINE TESTING W REFLEX): HIV Screen 4th Generation wRfx: NONREACTIVE

## 2020-06-15 MED ORDER — AMLODIPINE BESYLATE 10 MG PO TABS
10.0000 mg | ORAL_TABLET | Freq: Every day | ORAL | Status: DC
  Administered 2020-06-15 – 2020-06-22 (×8): 10 mg via ORAL
  Filled 2020-06-15 (×8): qty 1

## 2020-06-15 MED ORDER — INSULIN ASPART 100 UNIT/ML IJ SOLN
0.0000 [IU] | INTRAMUSCULAR | Status: DC
  Administered 2020-06-15 – 2020-06-16 (×4): 2 [IU] via SUBCUTANEOUS
  Administered 2020-06-16: 3 [IU] via SUBCUTANEOUS
  Administered 2020-06-16: 5 [IU] via SUBCUTANEOUS
  Administered 2020-06-17: 3 [IU] via SUBCUTANEOUS
  Administered 2020-06-17 (×3): 2 [IU] via SUBCUTANEOUS
  Administered 2020-06-18 (×2): 3 [IU] via SUBCUTANEOUS
  Administered 2020-06-19 (×2): 5 [IU] via SUBCUTANEOUS
  Administered 2020-06-19: 2 [IU] via SUBCUTANEOUS
  Administered 2020-06-19: 3 [IU] via SUBCUTANEOUS
  Administered 2020-06-20: 2 [IU] via SUBCUTANEOUS
  Administered 2020-06-20 – 2020-06-21 (×7): 3 [IU] via SUBCUTANEOUS
  Administered 2020-06-21: 2 [IU] via SUBCUTANEOUS
  Administered 2020-06-21: 3 [IU] via SUBCUTANEOUS
  Administered 2020-06-22: 2 [IU] via SUBCUTANEOUS

## 2020-06-15 MED ORDER — HEPARIN SODIUM (PORCINE) 5000 UNIT/ML IJ SOLN
5000.0000 [IU] | Freq: Three times a day (TID) | INTRAMUSCULAR | Status: AC
  Administered 2020-06-15 – 2020-06-17 (×9): 5000 [IU] via SUBCUTANEOUS
  Filled 2020-06-15 (×8): qty 1

## 2020-06-15 MED ORDER — ASPIRIN 81 MG PO CHEW: 81.0000 mg | CHEWABLE_TABLET | Freq: Every day | ORAL | Status: DC

## 2020-06-15 MED ORDER — CLONIDINE HCL 0.2 MG PO TABS
0.2000 mg | ORAL_TABLET | Freq: Every day | ORAL | Status: DC
  Administered 2020-06-15 – 2020-06-21 (×7): 0.2 mg via ORAL
  Filled 2020-06-15 (×7): qty 1

## 2020-06-15 MED ORDER — CLONIDINE HCL 0.3 MG PO TABS: 0.3000 mg | ORAL_TABLET | Freq: Every day | ORAL | Status: DC

## 2020-06-15 MED ORDER — ACETAMINOPHEN 650 MG RE SUPP: 650.0000 mg | Freq: Four times a day (QID) | RECTAL | Status: DC | PRN

## 2020-06-15 MED ORDER — HYDROMORPHONE HCL 1 MG/ML IJ SOLN
1.0000 mg | INTRAMUSCULAR | Status: DC | PRN
  Administered 2020-06-15 – 2020-06-20 (×18): 1 mg via INTRAVENOUS
  Filled 2020-06-15 (×19): qty 1

## 2020-06-15 MED ORDER — INSULIN GLARGINE 100 UNIT/ML ~~LOC~~ SOLN: 40.0000 [IU] | Freq: Every day | SUBCUTANEOUS | Status: DC

## 2020-06-15 MED ORDER — ACETAMINOPHEN 325 MG PO TABS: 650.0000 mg | ORAL_TABLET | Freq: Four times a day (QID) | ORAL | Status: DC | PRN

## 2020-06-15 MED ORDER — ONDANSETRON HCL 4 MG/2ML IJ SOLN
4.0000 mg | Freq: Four times a day (QID) | INTRAMUSCULAR | Status: DC | PRN
  Administered 2020-06-18 – 2020-06-21 (×2): 4 mg via INTRAVENOUS
  Filled 2020-06-15 (×2): qty 2

## 2020-06-15 MED ORDER — POLYETHYLENE GLYCOL 3350 17 G PO PACK
17.0000 g | PACK | Freq: Every day | ORAL | Status: DC | PRN
  Administered 2020-06-18 – 2020-06-19 (×2): 17 g via ORAL
  Filled 2020-06-15: qty 1

## 2020-06-15 MED ORDER — MORPHINE SULFATE (PF) 2 MG/ML IV SOLN
2.0000 mg | INTRAVENOUS | Status: DC | PRN
  Administered 2020-06-15: 2 mg via INTRAVENOUS
  Filled 2020-06-15: qty 1

## 2020-06-15 MED ORDER — HYDRALAZINE HCL 20 MG/ML IJ SOLN: 5.0000 mg | Freq: Four times a day (QID) | INTRAMUSCULAR | Status: DC | PRN

## 2020-06-15 MED ORDER — GADOBUTROL 1 MMOL/ML IV SOLN
8.5000 mL | Freq: Once | INTRAVENOUS | Status: AC | PRN
  Administered 2020-06-15: 8.5 mL via INTRAVENOUS

## 2020-06-15 MED ORDER — LEVOTHYROXINE SODIUM 25 MCG PO TABS
137.0000 ug | ORAL_TABLET | Freq: Every day | ORAL | Status: DC
  Administered 2020-06-15 – 2020-06-22 (×7): 137 ug via ORAL
  Filled 2020-06-15 (×7): qty 1

## 2020-06-15 MED ORDER — POTASSIUM CHLORIDE CRYS ER 20 MEQ PO TBCR
40.0000 meq | EXTENDED_RELEASE_TABLET | Freq: Once | ORAL | Status: AC
  Administered 2020-06-15: 40 meq via ORAL
  Filled 2020-06-15: qty 2

## 2020-06-15 MED ORDER — INSULIN GLARGINE 100 UNIT/ML ~~LOC~~ SOLN
7.0000 [IU] | Freq: Every day | SUBCUTANEOUS | Status: DC
  Administered 2020-06-15 – 2020-06-21 (×7): 7 [IU] via SUBCUTANEOUS
  Filled 2020-06-15 (×8): qty 0.07

## 2020-06-15 MED ORDER — ONDANSETRON HCL 4 MG PO TABS: 4.0000 mg | ORAL_TABLET | Freq: Four times a day (QID) | ORAL | Status: DC | PRN

## 2020-06-15 NOTE — Plan of Care (Signed)
  Problem: Education: Goal: Knowledge of General Education information will improve Description: Including pain rating scale, medication(s)/side effects and non-pharmacologic comfort measures Outcome: Progressing   Problem: Clinical Measurements: Goal: Ability to maintain clinical measurements within normal limits will improve Outcome: Progressing Goal: Will remain free from infection Outcome: Progressing Goal: Diagnostic test results will improve Outcome: Progressing   Problem: Coping: Goal: Level of anxiety will decrease Outcome: Progressing   

## 2020-06-15 NOTE — Progress Notes (Signed)
PROGRESS NOTE    Timothy DINKINS  QBH:[id number] DOB: [birth year] DOA: 06/14/2020 PCP: Tammi Sou, MD  Brief Narrative: 65 year old male with history of type 2 diabetes mellitus, hypertension, dyslipidemia presented to the ED with abdominal pain, reports intermittent abdominal pain for the last 3 weeks, this has progressively worsened, located in the epigastrium and left upper quadrant. -CT abdomen noted abnormal appearance of the pancreas with adjacent fat stranding,pancreatic ductal dilatation, and fullness of the pancreatic head and distal pancreatic body  Assessment & Plan:   Subacute pancreatitis Fullness of the pancreatic head -Supportive care, IV fluids, bowel rest -Denies alcoholism, no gallstones noted on CT -MRCP -Gastroenterology consult  Hypothyroidism -Continue Synthroid  Essential hypertension -Continue amlodipine and clonidine -Ramipril  Type 2 diabetes mellitus -On Lantus 20 units at baseline, currently on 7 units -Monitor CBGs  History of CVA -Continue aspirin  DVT prophylaxis: Heparin subcutaneous Code Status: Full code Family Communication: No family at bedside Disposition Plan:  Status is: Inpatient  Remains inpatient appropriate because:Inpatient level of care appropriate due to severity of illness   Dispo: The patient is from: Home              Anticipated d/c is to: Home              Patient currently is not medically stable to d/c.   Difficult to place patient No  Consultants:   Gastroenterology   Procedures:   Antimicrobials:    Subjective: -Complains of upper abdominal pain, some nausea and weight loss  Objective: Vitals:   06/15/20 0130 06/15/20 0146 06/15/20 0257 06/15/20 0645  BP: (!) 165/91  (!) 156/108 139/87  Pulse: 72  75 71  Resp: 12  16 16   Temp:  98.5 F (36.9 C) 98.1 F (36.7 C) 98.1 F (36.7 C)  TempSrc:  Oral Oral   SpO2: 100%  98% 99%  Weight:   84.7 kg   Height:   5\' 9"  (1.753 m)     Intake/Output  Summary (Last 24 hours) at 06/15/2020 1023 Last data filed at 06/15/2020 1006 Gross per 24 hour  Intake 1874.77 ml  Output 1450 ml  Net 424.77 ml   Filed Weights   06/14/20 1818 06/15/20 0257  Weight: 87.5 kg 84.7 kg    Examination:  General exam: Pleasant male laying in bed, AAOx3, no distress HEENT: No JVD CVS: S1-S2, regular rate rhythm Lungs: Clear bilaterally Abdomen: Soft, mild epigastric and left-sided tenderness, bowel sounds present Extremities: No edema  Skin: No rashes on exposed skin Psychiatry: Judgement and insight appear normal    Data Reviewed:   CBC: Recent Labs  Lab 06/12/20 0926 06/14/20 1859 06/15/20 0508  WBC 5.3 6.5 6.0  NEUTROABS 3.7 4.5  --   HGB 13.7 14.0 13.3  HCT 39.9 40.5 38.2*  MCV 89.3 88.6 88.4  PLT 175.0 199 097   Basic Metabolic Panel: Recent Labs  Lab 06/12/20 0926 06/14/20 1859 06/15/20 0508  NA 138 138 139  K 4.1 3.9 3.3*  CL 105 104 104  CO2 26 23 27   GLUCOSE 155* 114* 122*  BUN 17 17 11   CREATININE 1.08 1.12 1.04  CALCIUM 9.6 9.7 9.5  MG  --   --  1.9   GFR: Estimated Creatinine Clearance: 72.7 mL/min (by C-G formula based on SCr of 1.04 mg/dL). Liver Function Tests: Recent Labs  Lab 06/12/20 0926 06/14/20 1859 06/15/20 0508  AST 14 15 13*  ALT 16 14 13   ALKPHOS 81 75 76  BILITOT 0.5 0.2* 0.9  PROT 7.0 7.3 6.4*  ALBUMIN 4.2 4.1 3.7   Recent Labs  Lab 06/12/20 0926 06/14/20 1859  LIPASE 15.0 66*   No results for input(s): AMMONIA in the last 168 hours. Coagulation Profile: No results for input(s): INR, PROTIME in the last 168 hours. Cardiac Enzymes: No results for input(s): CKTOTAL, CKMB, CKMBINDEX, TROPONINI in the last 168 hours. BNP (last 3 results) No results for input(s): PROBNP in the last 8760 hours. HbA1C: No results for input(s): HGBA1C in the last 72 hours. CBG: Recent Labs  Lab 06/15/20 0252 06/15/20 0646  GLUCAP 138* 95   Lipid Profile: Recent Labs    06/15/20 0508  CHOL 148   HDL 34*  LDLCALC 103*  TRIG 55  CHOLHDL 4.4   Thyroid Function Tests: No results for input(s): TSH, T4TOTAL, FREET4, T3FREE, THYROIDAB in the last 72 hours. Anemia Panel: No results for input(s): VITAMINB12, FOLATE, FERRITIN, TIBC, IRON, RETICCTPCT in the last 72 hours. Urine analysis:    Component Value Date/Time   COLORURINE YELLOW 06/14/2020 2004   APPEARANCEUR CLEAR 06/14/2020 2004   LABSPEC 1.010 06/14/2020 2004   PHURINE 6.0 06/14/2020 2004   GLUCOSEU NEGATIVE 06/14/2020 2004   GLUCOSEU NEGATIVE 09/13/2016 0946   HGBUR NEGATIVE 06/14/2020 2004   HGBUR negative 05/05/2009 1323   BILIRUBINUR NEGATIVE 06/14/2020 2004   BILIRUBINUR Negative 11/22/2019 [place] 06/14/2020 2004   PROTEINUR NEGATIVE 06/14/2020 2004   UROBILINOGEN 0.2 11/22/2019 1048   UROBILINOGEN 0.2 09/13/2016 0946   NITRITE NEGATIVE 06/14/2020 2004   LEUKOCYTESUR NEGATIVE 06/14/2020 2004   Sepsis Labs: @LABRCNTIP (procalcitonin:4,lacticidven:4)  )No results found for this or any previous visit (from the past 240 hour(s)).   Radiology Studies: CT ABDOMEN PELVIS W CONTRAST  Result Date: 06/14/2020 CLINICAL DATA:  Diffuse abdominal pain. Bilateral flank pain for 3 weeks. EXAM: CT ABDOMEN AND PELVIS WITH CONTRAST TECHNIQUE: Multidetector CT imaging of the abdomen and pelvis was performed using the standard protocol following bolus administration of intravenous contrast. CONTRAST:  176mL OMNIPAQUE IOHEXOL 300 MG/ML  SOLN COMPARISON:  11/22/2019 FINDINGS: Lower chest: The lung bases are clear. The heart size is normal. Hepatobiliary: The liver is normal. Normal gallbladder.There is no biliary ductal dilation. Pancreas: There are inflammatory changes about the pancreas. There is pancreatic ductal dilatation with a possible abrupt cutoff in the distal pancreatic body. There is fullness of the pancreatic head with a heterogeneous appearance and areas of decreased attenuation. Spleen: Unremarkable.  Adrenals/Urinary Tract: --Adrenal glands: Unremarkable. --Right kidney/ureter: No hydronephrosis or radiopaque kidney stones. --Left kidney/ureter: No hydronephrosis or radiopaque kidney stones. --Urinary bladder: Unremarkable. Stomach/Bowel: --Stomach/Duodenum: No hiatal hernia or other gastric abnormality. Normal duodenal course and caliber. --Small bowel: Unremarkable. --Colon: Unremarkable. --Appendix: Normal. Vascular/Lymphatic: Atherosclerotic calcification is present within the non-aneurysmal abdominal aorta, without hemodynamically significant stenosis. --No retroperitoneal lymphadenopathy. --No mesenteric lymphadenopathy. --No pelvic or inguinal lymphadenopathy. Reproductive: Unremarkable Other: No ascites or free air. The abdominal wall is normal. Musculoskeletal. No acute displaced fractures. IMPRESSION: 1. Abnormal appearance of the pancreas with adjacent fat stranding, pancreatic ductal dilatation, and fullness of the pancreatic head and distal pancreatic body. Findings may be secondary to acute pancreatitis. However, the fullness of the pancreatic head and pancreatic ductal dilatation is suspicious for an underlying obstructing lesion. As such, follow-up with a nonemergent contrast enhanced MRI is recommended for further evaluation of this finding. 2. No other potentially acute abnormality detected in the abdomen or pelvis. Electronically Signed   By: Jamie Kato.D.  On: 06/14/2020 20:19    Scheduled Meds: . amLODipine  10 mg Oral Daily  . cloNIDine  0.3 mg Oral QHS  . heparin  5,000 Units Subcutaneous Q8H  . insulin aspart  0-15 Units Subcutaneous Q4H  . insulin glargine  7 Units Subcutaneous Q2200  . levothyroxine  137 mcg Oral Q0600   Continuous Infusions: . lactated ringers 75 mL/hr at 06/15/20 1006     LOS: 0 days    Time spent: 66min  Domenic Polite, MD [facility]  06/15/2020, 10:23 AM

## 2020-06-15 NOTE — Progress Notes (Incomplete)
New Admission Note:  Arrival Method: Via [facility] on a stretcher from [facility] to [id number] Mental Orientation: Alert & Oriented x4 Telemetry: Assessment: Completed Skin: Refer to flowsheet IV: Left AC  Pain: Tubes: Safety Measures: Safety Fall Prevention Plan discussed with patient. Admission: Completed [facility] [place] Orientation: Patient has been orientated to the room, unit and the staff.  Orders have been reviewed and are being implemented. Will continue to monitor the patient. Call light has been placed within reach and bed alarm has been activated.   Vassie Moselle, RN  Phone Number: [phone]

## 2020-06-15 NOTE — Consult Note (Addendum)
Attending physician's note   I have taken an interval history, reviewed the chart and examined the patient. I agree with the Advanced Practitioner's note, impression, and recommendations as outlined.   65 year old male with medical history as outlined below transferred from [facility] with acute pancreatitis.  No prior history of pancreatitis.  Does have a history of diabetes, thyroid disorder requiring thyroidectomy years ago, and reported rheumatoid arthritis.  Evaluation to date notable for the following:  - H/H on admission 14/40.  PLT 199 - Normal renal function - Normal liver enzymes - Lipase 66 - Lactate 1 - Normal UA - HIV negative.  Normal triglycerides - CT abdomen/pelvis: Pancreatic inflammation with PD dilation and possible abrupt cut off in the distal pancreatic body with fullness of the pancreatic head and heterogenous appearance  1) Acute pancreatitis 2) Abnormal appearing pancreas on CT 3) Epigastric pain 4) Nausea without emesis  - Increase IVF to LR at 250 - MRI/MRCP today - N.p.o. for now - Continue antiemetics and pain control per primary service - Check CA 19-9.  If elevated, plan for expedited inpatient EUS.  If normal, plan for outpatient EUS in 4-6 weeks allowing for healing of pancreatitis and improved imaging - Reasonable to check IgG4 with subclass - GI service will continue to follow   Cephas Darby, FACG [phone] office                                                                                                                                                                                 [facility] Consult: 8:46 AM 06/15/2020  LOS: 0 days    Referring Provider: Dr Broadus John  Primary Care Physician:  Tammi Sou, MD Primary Gastroenterologist:  Dr. Silvano Rusk     Reason for Consultation:  Pancreatitis.     HPI: Timothy Page is a 65 y.o. male.  Many medical problems, see list below.   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  COVID-19 in January 2022, did not require admission or outpatient medications, convalesced at home.  Melenic stool 06/2009 with TA colon polyp at colonoscopy and normal EGD.  Bleeding attributed to transient bleeding in the setting of NSAID use though no ulcers encountered. 01/2020 colonoscopy: Diminutive, sessile polyps x 3 removed (TAs).  Sigmoid diverticulosis.  Otherwise normal study.  Dr. Carlean Purl recommended surveillance colonoscopy in 01/2025.  3 weeks of epigastric pain.  It has progressed in recent days.  Very intense late last week and seen by PCP where he had lab work obtained and plan was to pursue imaging.  Pain worse after eating.  Radiates to the middle of his back.  Had not been taking any pain  meds at home.  No prior GI symptoms or problems.  On 4/29 lipase 15.  T bili 0.5.  Alk phos 81.  AST/ALT 14/16.  However the pain got very bad on the weekend and he presented to the ED yesterday.  T bili normal.  Alk phos 75.  AST/ALT 15/14.  Lipase 66. CBC normal. CTAP W contrast: Inflammation of around the pancreas.  PD dilated with possible abrupt cut off at the distal pancreatic body.  Fullness in HOP with decreased attenuation, heterogeneous appearance.  This all may reflect acute pancreatitis but the changes at [facility] and PD dilation suspicious for underlying obstructing lesion. MRI/MRCP: Ordered.  Pt does not drink alcohol and never has    Past Medical History:  Diagnosis Date  . Arthritis   . Atypical chest pain 04/2017   ACS ruled out 05/11/17  . CAP (community acquired pneumonia) 05/07/2017  . Diabetes mellitus without complication ([place])    Poor control starting fall 2021-->GAD ab neg.  Insulin and C peptide levels wnl but on lower end of normal->Dr Kuma/endo 2022.  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 Hx of adenomatous polyp of colon 07/02/2009   rpt colonoscopy 2026  .  Hyperlipidemia    Intol of multiple statins and zetia. [facility] lipid clinic 2022.  [name] Hypertension   . Lipoma    back  . Myocardial infarction ([place])    MI at age 89  . Perianal abscess 11/2019   I&D'd by Dr.Thompson, gen surg, in the ED 11/23/19  . 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 Cammy Copa  . Right ureteral calculus 04/2018   Cystoscopy with right retrograde pyelogram and right ureteral stent placement after stone removal.  . Seasonal allergies   . Stroke ([place])    in 69C no complications   . Tachyarrhythmia 1980   age 82 X 2 over 76 month period.  No recurrence since that time.  . Thoracic aortic aneurysm ([place])    Incidentally discovered, 4.1 cm-->Dr. Cyndia Bent. Rpt CT 08/2018 and 08/2019 stable.  . Vocal cord paralysis 07/2016   Left vocal cord paralysis noted after thyroid surgery.    Past Surgical History:  Procedure Laterality Date  . CARDIAC CATHETERIZATION  1980  . COLONOSCOPY W/ POLYPECTOMY  07/02/2009; 01/24/20   2011 adenoma.  2021 adenomas x 3->recall 2026  . CYSTOSCOPY WITH RETROGRADE PYELOGRAM, URETEROSCOPY AND STENT PLACEMENT Right 05/09/2018   Procedure: CYSTOSCOPY WITH RIGHT RETROGRADE RIGHT URETEROSCOPY STONE EXTRACTION RIGHT STENT EXCHANGE;  Surgeon: Lucas Mallow, MD;  Location: [facility];  Service: Urology;  Laterality: Right;  . CYSTOSCOPY/RETROGRADE/URETEROSCOPY/STONE EXTRACTION WITH BASKET Right 04/30/2018   Procedure: CYSTOSCOPY/RETROGRADE/RIGHT URETEROSCOPY/;  Surgeon: Lucas Mallow, MD;  Location: [facility];  Service: Urology;  Laterality: Right;  . ESOPHAGOGASTRODUODENOSCOPY  07/02/2009   NORMAL  . EYE SURGERY    . finger amputaion     with reattachement  . INCISION AND DRAINAGE ABSCESS ANAL  11/23/2019   gen surg (In ED).  [name] LASIK    . THYROIDECTOMY N/A 08/03/2016   Procedure: Total THYROIDECTOMY;  Surgeon: [name], MD;  Location: [facility];  Service: ENT;  Laterality: N/A;  Total Thyroidectomy   .  UPPER GASTROINTESTINAL ENDOSCOPY       Family History  Problem Relation Age of Onset  . Diabetes Mother   . Cancer Mother        lung  . COPD Mother   . Brain cancer  Mother   . Arthritis Mother   . Hyperlipidemia Mother   . Heart disease Mother   . Hypertension Mother   . Diabetes Father   . Cancer Father        mesothelioma; skin  . Mental illness Father   . Arthritis Father   . Hyperlipidemia Father   . Heart disease Father   . Hypertension Father   . Heart disease Maternal Grandmother        MI @ 63  . Heart disease Maternal Grandfather        MI @ 81  . Cancer Paternal Grandmother   . Diabetes Paternal Grandmother   . Heart disease Paternal Grandfather        MI @ 78  . Thyroid cancer Maternal Aunt      Prior to Admission medications   Medication Sig Start Date End Date Taking? Authorizing Provider  amLODipine (NORVASC) 10 MG tablet TAKE 1 TABLET BY MOUTH  DAILY 05/25/20   McGowen, Adrian Blackwater, MD  aspirin 81 MG tablet Take 81 mg by mouth daily.    [provider]  Blood Glucose Monitoring Suppl (ONE TOUCH ULTRA 2) w/Device KIT USE TO CHECK BLOOD SUGAR  ONCE DAILY 12/10/19   McGowen, Adrian Blackwater, MD  cloNIDine (CATAPRES) 0.3 MG tablet TAKE 1 TABLET BY MOUTH EVERY NIGHT AT BEDTIME FOR HYPERTENSION 06/08/20   McGowen, Adrian Blackwater, MD  Continuous Blood Gluc Sensor (FREESTYLE LIBRE 2 SENSOR) MISC 2 Devices by Does not apply route every 14 (fourteen) days. 04/03/20   Elayne Snare, MD  empagliflozin (JARDIANCE) 25 MG TABS tablet Take 1 tablet (25 mg total) by mouth daily before breakfast. 04/03/20   Elayne Snare, MD  Evolocumab (REPATHA SURECLICK) 970 MG/ML SOAJ Inject 1 Dose into the skin every 14 (fourteen) days. 05/14/20   Hilty, Nadean Corwin, MD  glucose blood (ONETOUCH ULTRA) test strip USE TO CHECK BLOOD SUGAR  ONCE DAILY Patient taking differently: USE TO CHECK BLOOD SUGAR  ONCE DAILY 12/03/19   McGowen, Adrian Blackwater, MD  insulin aspart (NOVOLOG FLEXPEN)  100 UNIT/ML FlexPen upto 12 units before meals tid 04/24/20   Elayne Snare, MD  insulin glargine (LANTUS SOLOSTAR) 100 UNIT/ML Solostar Pen Give up to 65 units SQ qhs. Patient will be up-titrating this med dose as needed to get glucoses into normal range. 06/08/20   McGowen, Adrian Blackwater, MD  insulin lispro (HUMALOG KWIKPEN) 100 UNIT/ML KwikPen upto 12 units before meals tid 05/18/20   Elayne Snare, MD  Insulin Pen Needle 31G X 5 MM MISC Use with insulin pen 3 times a day 04/24/20   Elayne Snare, MD  Lancets [facility] DELICA PLUS [id number]) MISC USE TO CHECK BLOOD SUGAR  ONCE DAILY 07/16/19   McGowen, Adrian Blackwater, MD  levothyroxine (SYNTHROID) 137 MCG tablet TAKE 1 TABLET BY MOUTH  DAILY BEFORE BREAKFAST 06/03/20   Elayne Snare, MD  loratadine (CLARITIN) 10 MG tablet Take 10 mg by mouth daily as needed for allergies.    [provider]  ramipril (ALTACE) 10 MG capsule TAKE 1 CAPSULE BY MOUTH  TWICE DAILY 01/16/20   McGowen, Adrian Blackwater, MD  senna-docusate (SENOKOT-S) 8.6-50 MG tablet Take 1 tablet by mouth 2 (two) times daily. 08/05/16   Elgergawy, Silver Huguenin, MD    Scheduled Meds: . amLODipine  10 mg Oral Daily  . cloNIDine  0.3 mg Oral QHS  . heparin  5,000 Units Subcutaneous Q8H  . insulin aspart  0-15 Units Subcutaneous Q4H  . insulin glargine  7 Units Subcutaneous Q2200  . levothyroxine  137 mcg Oral Q0600   Infusions: . lactated ringers 125 mL/hr at 06/15/20 0741   PRN Meds: acetaminophen **OR** acetaminophen, hydrALAZINE, HYDROmorphone (DILAUDID) injection, ondansetron **OR** ondansetron (ZOFRAN) IV, polyethylene glycol   Allergies as of 06/14/2020 - Review Complete 06/14/2020  Allergen Reaction Noted  . Atenolol  04/28/2011  . Hydroxyzine Other (See Comments) 05/18/2017  . Tetracycline    . Atorvastatin  11/23/2012  . Codeine  05/07/2017  . Guaifenesin  08/12/2016  . Mucinex [guaifenesin er] Other (See Comments) 05/07/2017    Family History  Problem Relation Age of Onset  . Diabetes  Mother   . Cancer Mother        lung  . COPD Mother   . Brain cancer Mother   . Arthritis Mother   . Hyperlipidemia Mother   . Heart disease Mother   . Hypertension Mother   . Diabetes Father   . Cancer Father        mesothelioma; skin  . Mental illness Father   . Arthritis Father   . Hyperlipidemia Father   . Heart disease Father   . Hypertension Father   . Heart disease Maternal Grandmother        MI @ 63  . Heart disease Maternal Grandfather        MI @ 42  . Cancer Paternal Grandmother   . Diabetes Paternal Grandmother   . Heart disease Paternal Grandfather        MI @ 95  . Thyroid cancer Maternal Aunt   . Colon cancer Neg Hx   . Esophageal cancer Neg Hx   . Rectal cancer Neg Hx   . Stomach cancer Neg Hx     Social History   Socioeconomic History  . Marital status: Married    Spouse name: Not on file  . Number of children: Not on file  . Years of education: Not on file  . Highest education level: Not on file  Occupational History  . Not on file  Tobacco Use  . Smoking status: Never Smoker  . Smokeless tobacco: Current User    Types: Snuff  Vaping Use  . Vaping Use: Never used  Substance and Sexual Activity  . Alcohol use: No  . Drug use: No  . Sexual activity: Not on file  Other Topics Concern  . Not on file  Social History Narrative   Married, 2 daughters, 1 grandson that lives with him.   Educ: some college   Occup: retired from the [employer].  Retired at 65 but returned to work at some point, now with grandson living with him.   No T/A/Ds.   Social Determinants of Health   Financial Resource Strain: Not on file  Food Insecurity: Not on file  Transportation Needs: Not on file  Physical Activity: Not on file  Stress: Not on file  Social Connections: Not on file  Intimate Partner Violence: Not on file    REVIEW OF SYSTEMS: Constitutional: Feels a bit weak. ENT:  No nose bleeds Pulm: No shortness of breath or cough CV:  No  palpitations, no LE edema.  No angina GU:  No hematuria, no frequency GI: See HPI Heme: No unusual bleeding or bruising. Transfusions: None Neuro:  No headaches, no peripheral tingling or numbness Derm:  No itching, no rash or sores.  Endocrine:  No sweats or chills.  No polyuria or dysuria Immunization: Has not been vaccinated for COVID-19. Travel:  None  beyond local counties in last few months.    PHYSICAL EXAM: Vital signs in last 24 hours: Vitals:   06/15/20 0257 06/15/20 0645  BP: (!) 156/108 139/87  Pulse: 75 71  Resp: 16 16  Temp: 98.1 F (36.7 C) 98.1 F (36.7 C)  SpO2: 98% 99%   Wt Readings from Last 3 Encounters:  06/15/20 84.7 kg  06/12/20 87.7 kg  04/24/20 88.5 kg    General: Pleasant, currently comfortable and not acutely ill-appearing. Head: No facial asymmetry or swelling. Eyes: No scleral icterus.  No conjunctival pallor. Ears: No hearing deficit Nose: No congestion or discharge Mouth: Oral mucosa moist, pink, clear.  Tongue midline. Neck: No JVD, masses, thyromegaly. Lungs: Clear bilaterally without labored breathing. Heart: RRR.  No MRG.  S1, S2 present. Abdomen: Soft.  Epigastric tenderness without guarding or rebound.  No masses, organomegaly, bruits, hernias.  Active bowel sounds.  No distention..   Rectal: Deferred Musc/Skeltl: No joint redness, swelling or gross deformity. Extremities: No CCE. Neurologic: Oriented x3.  Good historian.  Moves all 4 limbs without tremor or gross weakness. Skin: No rash, no sores, no jaundice. Nodes: No cervical adenopathy Psych: Cooperative, calm, pleasant.  Slightly flat affect  Intake/Output from previous day: 05/01 0701 - 05/02 0700 In: 1485.1 [I.V.:482.7; IV Piggyback:1002.4] Out: 1450 [Urine:1450] Intake/Output this shift: Total I/O In: 208.2 [I.V.:208.2] Out: -   LAB RESULTS: Recent Labs    06/12/20 0926 06/14/20 1859 06/15/20 0508  WBC 5.3 6.5 6.0  HGB 13.7 14.0 13.3  HCT 39.9 40.5 38.2*   PLT 175.0 199 170   BMET Lab Results  Component Value Date   NA 139 06/15/2020   NA 138 06/14/2020   NA 138 06/12/2020   K 3.3 (L) 06/15/2020   K 3.9 06/14/2020   K 4.1 06/12/2020   CL 104 06/15/2020   CL 104 06/14/2020   CL 105 06/12/2020   CO2 27 06/15/2020   CO2 23 06/14/2020   CO2 26 06/12/2020   GLUCOSE 122 (H) 06/15/2020   GLUCOSE 114 (H) 06/14/2020   GLUCOSE 155 (H) 06/12/2020   BUN 11 06/15/2020   BUN 17 06/14/2020   BUN 17 06/12/2020   CREATININE 1.04 06/15/2020   CREATININE 1.12 06/14/2020   CREATININE 1.08 06/12/2020   CALCIUM 9.5 06/15/2020   CALCIUM 9.7 06/14/2020   CALCIUM 9.6 06/12/2020   LFT Recent Labs    06/12/20 0926 06/14/20 1859 06/15/20 0508  PROT 7.0 7.3 6.4*  ALBUMIN 4.2 4.1 3.7  AST 14 15 13*  ALT _0 ALKPHOS 81 75 76  BILITOT 0.5 0.2* 0.9   PT/INR Lab Results  Component Value Date   INR 1.14 08/01/2016   Hepatitis Panel No results for input(s): HEPBSAG, HCVAB, HEPAIGM, HEPBIGM in the last 72 hours. C-Diff No components found for: CDIFF Lipase     Component Value Date/Time   LIPASE 66 (H) 06/14/2020 1859    Drugs of Abuse  No results found for: LABOPIA, COCAINSCRNUR, LABBENZ, AMPHETMU, THCU, LABBARB   RADIOLOGY STUDIES: CT ABDOMEN PELVIS W CONTRAST  Result Date: 06/14/2020 CLINICAL DATA:  Diffuse abdominal pain. Bilateral flank pain for 3 weeks. EXAM: CT ABDOMEN AND PELVIS WITH CONTRAST TECHNIQUE: Multidetector CT imaging of the abdomen and pelvis was performed using the standard protocol following bolus administration of intravenous contrast. CONTRAST:  18m OMNIPAQUE IOHEXOL 300 MG/ML  SOLN COMPARISON:  11/22/2019 FINDINGS: Lower chest: The lung bases are clear. The heart size is normal. Hepatobiliary: The liver is normal.  Normal gallbladder.There is no biliary ductal dilation. Pancreas: There are inflammatory changes about the pancreas. There is pancreatic ductal dilatation with a possible abrupt cutoff in the distal  pancreatic body. There is fullness of the pancreatic head with a heterogeneous appearance and areas of decreased attenuation. Spleen: Unremarkable. Adrenals/Urinary Tract: --Adrenal glands: Unremarkable. --Right kidney/ureter: No hydronephrosis or radiopaque kidney stones. --Left kidney/ureter: No hydronephrosis or radiopaque kidney stones. --Urinary bladder: Unremarkable. Stomach/Bowel: --Stomach/Duodenum: No hiatal hernia or other gastric abnormality. Normal duodenal course and caliber. --Small bowel: Unremarkable. --Colon: Unremarkable. --Appendix: Normal. Vascular/Lymphatic: Atherosclerotic calcification is present within the non-aneurysmal abdominal aorta, without hemodynamically significant stenosis. --No retroperitoneal lymphadenopathy. --No mesenteric lymphadenopathy. --No pelvic or inguinal lymphadenopathy. Reproductive: Unremarkable Other: No ascites or free air. The abdominal wall is normal. Musculoskeletal. No acute displaced fractures. IMPRESSION: 1. Abnormal appearance of the pancreas with adjacent fat stranding, pancreatic ductal dilatation, and fullness of the pancreatic head and distal pancreatic body. Findings may be secondary to acute pancreatitis. However, the fullness of the pancreatic head and pancreatic ductal dilatation is suspicious for an underlying obstructing lesion. As such, follow-up with a nonemergent contrast enhanced MRI is recommended for further evaluation of this finding. 2. No other potentially acute abnormality detected in the abdomen or pelvis. Electronically Signed   By: Constance Holster M.D.   On: 06/14/2020 20:19     IMPRESSION:   *    Acute pancreatitis. Dilated pancreatic duct with abrupt cut off at the distal pancreatic body, fullness in head of pancreas with decreased attenuation, heterogeneity.  CT findings may be all due to acute pancreatitis but need to rule out underlying obstructive lesion.    PLAN:     *    MRI/MRCP ordered.  MR techs have him on  the list and will get to him sometime this afternoon or this evening. COVID-19 testing was performed early this morning but specimen never made it to the lab.  Rapid COVID testing repeated at 1300 today.  *   CA 19-9 level in the morning   Azucena Freed  06/15/2020, 8:46 AM Phone [phone]

## 2020-06-15 NOTE — H&P (Signed)
PCP:   Tammi Sou, MD   Chief Complaint:  Abdominal pain  HPI: This is a 65 year old male who states approximately 3 weeks ago his stomach started hurting.  The pain kept getting worse.  After a while it moved to his back, greater on the right than left and his kidneys.  On Friday he saw his PCP and studies were ordered.  He has not yet received results.  Yesterday his pain got so bad he could not lay down, he could not eat or sleep.  He went to the urgent care.  He reports nausea but no emesis.  He reports chills but no fever.  He states his p.o. intake has decreased including his liquid intake but he states his urine output is unchanged.  He describes the pain as sharp in the back and cramping in the stomach.  Initially his pain was intermittent but is now become continuous.  He also states his pain felt fairly continuous after he eats.  He denies any new medications except for NovoLog.  Studies revealed a mildly increased lipase at 66 and a CT scan consistent with pancreatitis, which does not rule out malignancy.  The patient was transferred to [facility]  History provided by the patient was alert and oriented  Review of Systems:  Patient endorses anorexia, abdominal pain, nausea, flank pain, chills The patient denies anorexia, fever, weight loss,, vision loss, decreased hearing, hoarseness, chest pain, syncope, dyspnea on exertion, peripheral edema, balance deficits, hemoptysis, melena, hematochezia, severe indigestion/heartburn, hematuria, incontinence, genital sores, muscle weakness, suspicious skin lesions, transient blindness, difficulty walking, depression, unusual weight change, abnormal bleeding, enlarged lymph nodes, angioedema, and breast masses.  Past Medical History: Past Medical History:  Diagnosis Date  . Arthritis   . Atypical chest pain 04/2017   ACS ruled out 05/11/17  . CAP (community acquired pneumonia) 05/07/2017  . Diabetes mellitus without complication  ([place])    Poor control starting fall 2021-->GAD ab neg.  Insulin and C peptide levels wnl but on lower end of normal->Dr Kuma/endo 2022.  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 Hx of adenomatous polyp of colon 07/02/2009   rpt colonoscopy 2026  . Hyperlipidemia    Intol of multiple statins and zetia. [facility] 2022.  [name] Hypertension   . Lipoma    back  . Myocardial infarction ([place])    MI at age 21  . Perianal abscess 11/2019   I&D'd by Dr.Thompson, gen surg, in the ED 11/23/19  . 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 Cammy Copa  . Right ureteral calculus 04/2018   Cystoscopy with right retrograde pyelogram and right ureteral stent placement after stone removal.  . Seasonal allergies   . Stroke ([place])    in 82O no complications   . Tachyarrhythmia 1980   age 31 X 2 over 76 month period.  No recurrence since that time.  . Thoracic aortic aneurysm ([place])    Incidentally discovered, 4.1 cm-->Dr. Cyndia Bent. Rpt CT 08/2018 and 08/2019 stable.  . Vocal cord paralysis 07/2016   Left vocal cord paralysis noted after thyroid surgery.   Past Surgical History:  Procedure Laterality Date  . CARDIAC CATHETERIZATION  1980  . COLONOSCOPY W/ POLYPECTOMY  07/02/2009; 01/24/20   2011 adenoma.  2021 adenomas x 3->recall 2026  . CYSTOSCOPY WITH RETROGRADE PYELOGRAM, URETEROSCOPY AND STENT PLACEMENT Right 05/09/2018   Procedure: CYSTOSCOPY WITH RIGHT RETROGRADE RIGHT URETEROSCOPY STONE EXTRACTION RIGHT STENT  EXCHANGE;  Surgeon: Lucas Mallow, MD;  Location: [facility];  Service: Urology;  Laterality: Right;  . CYSTOSCOPY/RETROGRADE/URETEROSCOPY/STONE EXTRACTION WITH BASKET Right 04/30/2018   Procedure: CYSTOSCOPY/RETROGRADE/RIGHT URETEROSCOPY/;  Surgeon: Lucas Mallow, MD;  Location: [facility];  Service: Urology;  Laterality: Right;  . ESOPHAGOGASTRODUODENOSCOPY  07/02/2009   NORMAL  . EYE SURGERY    . finger amputaion      with reattachement  . INCISION AND DRAINAGE ABSCESS ANAL  11/23/2019   gen surg (In ED).  [name] LASIK    . THYROIDECTOMY N/A 08/03/2016   Procedure: Total THYROIDECTOMY;  Surgeon: [name], MD;  Location: [facility];  Service: ENT;  Laterality: N/A;  Total Thyroidectomy   . UPPER GASTROINTESTINAL ENDOSCOPY      Medications: Prior to Admission medications   Medication Sig Start Date End Date Taking? Authorizing Provider  amLODipine (NORVASC) 10 MG tablet TAKE 1 TABLET BY MOUTH  DAILY 05/25/20   McGowen, Adrian Blackwater, MD  aspirin 81 MG tablet Take 81 mg by mouth daily.    [provider]  Blood Glucose Monitoring Suppl (ONE TOUCH ULTRA 2) w/Device KIT USE TO CHECK BLOOD SUGAR  ONCE DAILY 12/10/19   McGowen, Adrian Blackwater, MD  cloNIDine (CATAPRES) 0.3 MG tablet TAKE 1 TABLET BY MOUTH EVERY NIGHT AT BEDTIME FOR HYPERTENSION 06/08/20   McGowen, Adrian Blackwater, MD  Continuous Blood Gluc Sensor (FREESTYLE LIBRE 2 SENSOR) MISC 2 Devices by Does not apply route every 14 (fourteen) days. 04/03/20   Elayne Snare, MD  empagliflozin (JARDIANCE) 25 MG TABS tablet Take 1 tablet (25 mg total) by mouth daily before breakfast. 04/03/20   Elayne Snare, MD  Evolocumab (REPATHA SURECLICK) 376 MG/ML SOAJ Inject 1 Dose into the skin every 14 (fourteen) days. 05/14/20   Hilty, Nadean Corwin, MD  glucose blood (ONETOUCH ULTRA) test strip USE TO CHECK BLOOD SUGAR  ONCE DAILY Patient taking differently: USE TO CHECK BLOOD SUGAR  ONCE DAILY 12/03/19   McGowen, Adrian Blackwater, MD  insulin aspart (NOVOLOG FLEXPEN) 100 UNIT/ML FlexPen upto 12 units before meals tid 04/24/20   Elayne Snare, MD  insulin glargine (LANTUS SOLOSTAR) 100 UNIT/ML Solostar Pen Give up to 65 units SQ qhs. Patient will be up-titrating this med dose as needed to get glucoses into normal range. 06/08/20   McGowen, Adrian Blackwater, MD  insulin lispro (HUMALOG KWIKPEN) 100 UNIT/ML KwikPen upto 12 units before meals tid 05/18/20   Elayne Snare, MD  Insulin Pen Needle 31G X 5 MM MISC Use with  insulin pen 3 times a day 04/24/20   Elayne Snare, MD  Lancets [facility] DELICA PLUS [id number]) MISC USE TO CHECK BLOOD SUGAR  ONCE DAILY 07/16/19   McGowen, Adrian Blackwater, MD  levothyroxine (SYNTHROID) 137 MCG tablet TAKE 1 TABLET BY MOUTH  DAILY BEFORE BREAKFAST 06/03/20   Elayne Snare, MD  loratadine (CLARITIN) 10 MG tablet Take 10 mg by mouth daily as needed for allergies.    [provider]  ramipril (ALTACE) 10 MG capsule TAKE 1 CAPSULE BY MOUTH  TWICE DAILY 01/16/20   McGowen, Adrian Blackwater, MD  senna-docusate (SENOKOT-S) 8.6-50 MG tablet Take 1 tablet by mouth 2 (two) times daily. 08/05/16   Elgergawy, Silver Huguenin, MD    Allergies:   Allergies  Allergen Reactions  . Atenolol     Severe "slowing"of functioning Severe "slowing"of functioning  . Hydroxyzine Other (See Comments)    "felt like I was coming out of my skin"  . Tetracycline  Rash Because of a history of documented adverse serious drug reaction;Medi Alert bracelet  is recommended Rash Because of a history of documented adverse serious drug reaction;Medi Alert braceletis recommended  . Atorvastatin     D/Ced by him due to Myalgias in Sept 2014 D/Ced by him due to Myalgias in Sept 2014  . Codeine     Hallucinations. "I see pink elephants"  . Guaifenesin     Other reaction(s): Other (See Comments) unknown unknown  . Mucinex [Guaifenesin Er] Other (See Comments)    Hallucinations "I see pink elephants"    Social History:  reports that he has never smoked. His smokeless tobacco use includes snuff. He reports that he does not drink alcohol and does not use drugs.  Family History: Family History  Problem Relation Age of Onset  . Diabetes Mother   . Cancer Mother        lung  . COPD Mother   . Brain cancer Mother   . Arthritis Mother   . Hyperlipidemia Mother   . Heart disease Mother   . Hypertension Mother   . Diabetes Father   . Cancer Father        mesothelioma; skin  . Mental illness Father   . Arthritis  Father   . Hyperlipidemia Father   . Heart disease Father   . Hypertension Father   . Heart disease Maternal Grandmother        MI @ 60  . Heart disease Maternal Grandfather        MI @ 10  . Cancer Paternal Grandmother   . Diabetes Paternal Grandmother   . Heart disease Paternal Grandfather        MI @ 26  . Thyroid cancer Maternal Aunt   . Colon cancer Neg Hx   . Esophageal cancer Neg Hx   . Rectal cancer Neg Hx   . Stomach cancer Neg Hx     Physical Exam: Vitals:   06/15/20 0100 06/15/20 0130 06/15/20 0146 06/15/20 0257  BP: (!) 155/88 (!) 165/91  (!) 156/108  Pulse: 72 72  75  Resp: '10 12  16  ' Temp:   98.5 F (36.9 C) 98.1 F (36.7 C)  TempSrc:   Oral Oral  SpO2: 99% 100%  98%  Weight:      Height:        General:  Alert and oriented times three, well developed and nourished, no acute distress Eyes: PERRLA, pink conjunctiva, no scleral icterus ENT: Moist oral mucosa, neck supple, no thyromegaly Lungs: clear to ascultation, no wheeze, no crackles, no use of accessory muscles Cardiovascular: regular rate and rhythm, no regurgitation, no gallops, no murmurs. No carotid bruits, no JVD Abdomen: soft, positive BS, positive tenderness to palpation, non-distended, no organomegaly, not an acute abdomen GU: not examined Neuro: CN II - XII grossly intact, sensation intact Musculoskeletal: strength 5/5 all extremities, no clubbing, cyanosis or edema Skin: no rash, no subcutaneous crepitation, no decubitus Psych: appropriate patient   Labs on Admission:  Recent Labs    06/12/20 0926 06/14/20 1859  NA 138 138  K 4.1 3.9  CL 105 104  CO2 26 23  GLUCOSE 155* 114*  BUN 17 17  CREATININE 1.08 1.12  CALCIUM 9.6 9.7   Recent Labs    06/12/20 0926 06/14/20 1859  AST 14 15  ALT 16 14  ALKPHOS 81 75  BILITOT 0.5 0.2*  PROT 7.0 7.3  ALBUMIN 4.2 4.1   Recent Labs  06/12/20 0926 06/14/20 1859  LIPASE 15.0 66*   Recent Labs    06/12/20 0926 06/14/20 1859   WBC 5.3 6.5  NEUTROABS 3.7 4.5  HGB 13.7 14.0  HCT 39.9 40.5  MCV 89.3 88.6  PLT 175.0 199   No results for input(s): CKTOTAL, CKMB, CKMBINDEX, TROPONINI in the last 72 hours. Invalid input(s): POCBNP No results for input(s): DDIMER in the last 72 hours. Recent Labs    06/12/20 0926  HGBA1C 7.7*   Recent Labs    06/12/20 0926  CHOL 139  HDL 33.60*  LDLCALC 93  TRIG 58.0  CHOLHDL 4   No results for input(s): TSH, T4TOTAL, T3FREE, THYROIDAB in the last 72 hours.  Invalid input(s): FREET3 No results for input(s): VITAMINB12, FOLATE, FERRITIN, TIBC, IRON, RETICCTPCT in the last 72 hours.  Micro Results: No results found for this or any previous visit (from the past 240 hour(s)).   Radiological Exams on Admission: CT ABDOMEN PELVIS W CONTRAST  Result Date: 06/14/2020 CLINICAL DATA:  Diffuse abdominal pain. Bilateral flank pain for 3 weeks. EXAM: CT ABDOMEN AND PELVIS WITH CONTRAST TECHNIQUE: Multidetector CT imaging of the abdomen and pelvis was performed using the standard protocol following bolus administration of intravenous contrast. CONTRAST:  149m OMNIPAQUE IOHEXOL 300 MG/ML  SOLN COMPARISON:  11/22/2019 FINDINGS: Lower chest: The lung bases are clear. The heart size is normal. Hepatobiliary: The liver is normal. Normal gallbladder.There is no biliary ductal dilation. Pancreas: There are inflammatory changes about the pancreas. There is pancreatic ductal dilatation with a possible abrupt cutoff in the distal pancreatic body. There is fullness of the pancreatic head with a heterogeneous appearance and areas of decreased attenuation. Spleen: Unremarkable. Adrenals/Urinary Tract: --Adrenal glands: Unremarkable. --Right kidney/ureter: No hydronephrosis or radiopaque kidney stones. --Left kidney/ureter: No hydronephrosis or radiopaque kidney stones. --Urinary bladder: Unremarkable. Stomach/Bowel: --Stomach/Duodenum: No hiatal hernia or other gastric abnormality. Normal duodenal  course and caliber. --Small bowel: Unremarkable. --Colon: Unremarkable. --Appendix: Normal. Vascular/Lymphatic: Atherosclerotic calcification is present within the non-aneurysmal abdominal aorta, without hemodynamically significant stenosis. --No retroperitoneal lymphadenopathy. --No mesenteric lymphadenopathy. --No pelvic or inguinal lymphadenopathy. Reproductive: Unremarkable Other: No ascites or free air. The abdominal wall is normal. Musculoskeletal. No acute displaced fractures. IMPRESSION: 1. Abnormal appearance of the pancreas with adjacent fat stranding, pancreatic ductal dilatation, and fullness of the pancreatic head and distal pancreatic body. Findings may be secondary to acute pancreatitis. However, the fullness of the pancreatic head and pancreatic ductal dilatation is suspicious for an underlying obstructing lesion. As such, follow-up with a nonemergent contrast enhanced MRI is recommended for further evaluation of this finding. 2. No other potentially acute abnormality detected in the abdomen or pelvis. Electronically Signed   By: CConstance HolsterM.D.   On: 06/14/2020 20:19    Assessment/Plan Present on Admission: . Acute pancreatitis -Admit to MedSurg -N.p.o., IV fluid hydration, pain meds ordered -Triglycerides within the normal range -MRI abdomen with contrast ordered -CMP, CBC in a.m. -Defer to a.m. team if GI consult needed  . CAD (coronary artery disease) -Stable.  Patient reports aortic enlargement.  No echo available for evaluation.  As patient will need aggressive fluid hydration, 2D echo has been ordered  . History of thyroidectomy, currently hypothyroid -Home meds resumed  . Essential hypertension -Uncontrolled, home meds resumed.  May been affected by pain. -Pain meds ordered as needed -As needed blood pressure medications ordered  . Hyperlipidemia -Stable, Home meds resumed  Diabetes mellitus, insulin-dependent -Patient on 20 units of Lantus daily.  As  patient n.p.o., 7 units clear sleep ordered  History of stroke -no residual deficits Vocal cord paralysis right-aware  Malakye Nolden 06/15/2020, 3:23 AM

## 2020-06-16 DIAGNOSIS — R1013 Epigastric pain: Secondary | ICD-10-CM | POA: Diagnosis not present

## 2020-06-16 DIAGNOSIS — K8689 Other specified diseases of pancreas: Secondary | ICD-10-CM | POA: Diagnosis not present

## 2020-06-16 DIAGNOSIS — R933 Abnormal findings on diagnostic imaging of other parts of digestive tract: Secondary | ICD-10-CM

## 2020-06-16 DIAGNOSIS — K859 Acute pancreatitis without necrosis or infection, unspecified: Secondary | ICD-10-CM | POA: Diagnosis not present

## 2020-06-16 DIAGNOSIS — R110 Nausea: Secondary | ICD-10-CM | POA: Diagnosis not present

## 2020-06-16 DIAGNOSIS — C259 Malignant neoplasm of pancreas, unspecified: Secondary | ICD-10-CM | POA: Insufficient documentation

## 2020-06-16 LAB — COMPREHENSIVE METABOLIC PANEL
ALT: 11 U/L (ref 0–44)
AST: 11 U/L — ABNORMAL LOW (ref 15–41)
Albumin: 3.5 g/dL (ref 3.5–5.0)
Alkaline Phosphatase: 72 U/L (ref 38–126)
Anion gap: 8 (ref 5–15)
BUN: 9 mg/dL (ref 8–23)
CO2: 26 mmol/L (ref 22–32)
Calcium: 9.3 mg/dL (ref 8.9–10.3)
Chloride: 105 mmol/L (ref 98–111)
Creatinine, Ser: 0.99 mg/dL (ref 0.61–1.24)
GFR, Estimated: >60 mL/min (ref >60)
Glucose, Bld: 96 mg/dL (ref 70–99)
Potassium: 3.9 mmol/L (ref 3.5–5.1)
Sodium: 139 mmol/L (ref 135–145)
Total Bilirubin: 0.4 mg/dL (ref 0.3–1.2)
Total Protein: 6.1 g/dL — ABNORMAL LOW (ref 6.5–8.1)

## 2020-06-16 LAB — CBC
HCT: 37.5 % — ABNORMAL LOW (ref 39.0–52.0)
Hemoglobin: 12.6 g/dL — ABNORMAL LOW (ref 13.0–17.0)
MCH: 30.6 pg (ref 26.0–34.0)
MCHC: 33.6 g/dL (ref 30.0–36.0)
MCV: 91 fL (ref 80.0–100.0)
Platelets: 175 10*3/uL (ref 150–400)
RBC: 4.12 MIL/uL — ABNORMAL LOW (ref 4.22–5.81)
RDW: 12.1 % (ref 11.5–15.5)
WBC: 5.9 10*3/uL (ref 4.0–10.5)
nRBC: 0 % (ref 0.0–0.2)

## 2020-06-16 LAB — GLUCOSE, CAPILLARY
Glucose-Capillary: 103 mg/dL — ABNORMAL HIGH (ref 70–99)
Glucose-Capillary: 112 mg/dL — ABNORMAL HIGH (ref 70–99)
Glucose-Capillary: 141 mg/dL — ABNORMAL HIGH (ref 70–99)
Glucose-Capillary: 170 mg/dL — ABNORMAL HIGH (ref 70–99)
Glucose-Capillary: 206 mg/dL — ABNORMAL HIGH (ref 70–99)
Glucose-Capillary: 91 mg/dL (ref 70–99)

## 2020-06-16 NOTE — Progress Notes (Addendum)
Attending physician's note   I have taken an interval history, reviewed the chart and examined the patient. I agree with the Advanced Practitioner's note, impression, and recommendations as outlined.   MRCP completed yesterday and demonstrates 1.2 x 1.1 cm rim-enhancing lesion in the right lobe of the liver.  Pancreatic inflammation c/w acute pancreatitis but also hypoenhancing 3.2 x 2.4 cm lesion in the pancreatic head/neck with upstream dilation of the PD to 9 mm.  SMV and PVR abruptly effaced or occluded within the pancreatic head.  I discussed the MRCP findings with the patient at length today along with his wife by phone with patient present.  Certainly highest concern is pancreatic CA.  We discussed that diagnosis along with a full DDx today.  Needs EUS for tissue sampling and definitive diagnosis, along with staging.  - I will coordinate with the advanced biliary service re: timing of EUS - CA 19-9 pending - Will need CT chest for additional staging - Tolerating clears - Continue pain management and antiemetics as currently doing - Until p.o. intake picks up, I would increase IVF to LR at 250 cc/hour - GI service will continue to follow  Gerrit Heck, DO, FACG [phone] office                                                                      Daily Rounding Note  06/16/2020, 12:18 PM  LOS: 1 day   SUBJECTIVE:   Chief complaint: Pancreatitis.  Question pancreatic neoplasm.     Still has the same abdominal pain.  Nausea but no emesis.  Tolerating clear liquids.  Earlier today was not urinating much but now urine output has improved and the urine is quite pale yellow.  OBJECTIVE:         Vital signs in last 24 hours:    Temp:  [98.4 F (36.9 C)-99.4 F (37.4 C)] 98.4 F (36.9 C) (05/03 0950) Pulse Rate:  [68-98] 98 (05/03 0950) Resp:  [16-18] 16 (05/03 0950) BP: (110-169)/(78-85) 169/85 (05/03 0950) SpO2:  [96 %-98 %] 96 % (05/03 0950) Last BM Date:  06/13/20 Filed Weights   06/14/20 1818 06/15/20 0257  Weight: 87.5 kg 84.7 kg   General: NAD Heart: RRR Chest: No labored breathing Abdomen: Soft.  Tender.  Active bowel sounds.  No distention Extremities: No CCE Neuro/Psych: Oriented x3.  Alert.  Appropriate.  Intake/Output from previous day: 05/02 0701 - 05/03 0700 In: 2310.4 [P.O.:30; I.V.:2280.4] Out: 1500 [Urine:1500]  Intake/Output this shift: Total I/O In: 547.7 [I.V.:547.7] Out: -   Lab Results: Recent Labs    06/14/20 1859 06/15/20 0508 06/16/20 0141  WBC 6.5 6.0 5.9  HGB 14.0 13.3 12.6*  HCT 40.5 38.2* 37.5*  PLT 199 170 175   BMET Recent Labs    06/14/20 1859 06/15/20 0508 06/16/20 0141  NA 138 139 139  K 3.9 3.3* 3.9  CL 104 104 105  CO2 23 27 26   GLUCOSE 114* 122* 96  BUN 17 11 9   CREATININE 1.12 1.04 0.99  CALCIUM 9.7 9.5 9.3   LFT Recent Labs    06/14/20 1859 06/15/20 0508 06/16/20 0141  PROT 7.3 6.4* 6.1*  ALBUMIN 4.1 3.7 3.5  AST 15 13* 11*  ALT 14 13  11  ALKPHOS 75 76 72  BILITOT 0.2* 0.9 0.4   PT/INR No results for input(s): LABPROT, INR in the last 72 hours. Hepatitis Panel No results for input(s): HEPBSAG, HCVAB, HEPAIGM, HEPBIGM in the last 72 hours.  Studies/Results: CT ABDOMEN PELVIS W CONTRAST  Result Date: 06/14/2020 CLINICAL DATA:  Diffuse abdominal pain. Bilateral flank pain for 3 weeks. EXAM: CT ABDOMEN AND PELVIS WITH CONTRAST TECHNIQUE: Multidetector CT imaging of the abdomen and pelvis was performed using the standard protocol following bolus administration of intravenous contrast. CONTRAST:  100mL OMNIPAQUE IOHEXOL 300 MG/ML  SOLN COMPARISON:  11/22/2019 FINDINGS: Lower chest: The lung bases are clear. The heart size is normal. Hepatobiliary: The liver is normal. Normal gallbladder.There is no biliary ductal dilation. Pancreas: There are inflammatory changes about the pancreas. There is pancreatic ductal dilatation with a possible abrupt cutoff in the distal  pancreatic body. There is fullness of the pancreatic head with a heterogeneous appearance and areas of decreased attenuation. Spleen: Unremarkable. Adrenals/Urinary Tract: --Adrenal glands: Unremarkable. --Right kidney/ureter: No hydronephrosis or radiopaque kidney stones. --Left kidney/ureter: No hydronephrosis or radiopaque kidney stones. --Urinary bladder: Unremarkable. Stomach/Bowel: --Stomach/Duodenum: No hiatal hernia or other gastric abnormality. Normal duodenal course and caliber. --Small bowel: Unremarkable. --Colon: Unremarkable. --Appendix: Normal. Vascular/Lymphatic: Atherosclerotic calcification is present within the non-aneurysmal abdominal aorta, without hemodynamically significant stenosis. --No retroperitoneal lymphadenopathy. --No mesenteric lymphadenopathy. --No pelvic or inguinal lymphadenopathy. Reproductive: Unremarkable Other: No ascites or free air. The abdominal wall is normal. Musculoskeletal. No acute displaced fractures. IMPRESSION: 1. Abnormal appearance of the pancreas with adjacent fat stranding, pancreatic ductal dilatation, and fullness of the pancreatic head and distal pancreatic body. Findings may be secondary to acute pancreatitis. However, the fullness of the pancreatic head and pancreatic ductal dilatation is suspicious for an underlying obstructing lesion. As such, follow-up with a nonemergent contrast enhanced MRI is recommended for further evaluation of this finding. 2. No other potentially acute abnormality detected in the abdomen or pelvis. Electronically Signed   By: Christopher  Green M.D.   On: 06/14/2020 20:19   MR ABDOMEN MRCP W WO CONTAST  Result Date: 06/15/2020 CLINICAL DATA:  Abnormal pancreas on prior CT, possible acute pancreatitis versus mass EXAM: MRI ABDOMEN WITHOUT AND WITH CONTRAST (INCLUDING MRCP) TECHNIQUE: Multiplanar multisequence MR imaging of the abdomen was performed both before and after the administration of intravenous contrast. Heavily  T2-weighted images of the biliary and pancreatic ducts were obtained, and three-dimensional MRCP images were rendered by post processing. CONTRAST:  8.5mL GADAVIST GADOBUTROL 1 MMOL/ML IV SOLN COMPARISON:  CT abdomen pelvis, 06/14/2020, 11/22/2019 FINDINGS: Lower chest: No acute findings. Hepatobiliary: There is a rim enhancing lesion of the right lobe of the liver abutting the gallbladder fossa, hepatic segment V, measuring 1.2 x 1.1 cm (series 2, image 42). No biliary ductal dilatation. Pancreas: Diffuse inflammatory fat stranding about the pancreas. There is a hypoenhancing lesion in the superior pancreatic head and neck measuring approximately 3.2 x 2.4 cm (series 20, image 48). Distal to this lesion, there is gross dilatation of the pancreatic duct, measuring up to 9 mm in caliber. The duct is not visualized through the pancreatic head, but is reconstituted near the ampulla. The central superior mesenteric vein and portal vein are abruptly effaced or occluded within the pancreatic head. Spleen:  Within normal limits in size and appearance. Adrenals/Urinary Tract: No masses identified. No evidence of hydronephrosis. Stomach/Bowel: Visualized portions within the abdomen are unremarkable. Vascular/Lymphatic: No pathologically enlarged lymph nodes identified. No abdominal aortic aneurysm demonstrated. Other:    None. Musculoskeletal: No suspicious bone lesions identified. IMPRESSION: 1. There is a hypoenhancing lesion in the superior pancreatic head and neck measuring approximately 3.2 x 2.4 cm. 2. Distal to this lesion, there is gross dilatation of the pancreatic duct, measuring up to 9 mm in caliber. The duct is not visualized through the pancreatic head, but is reconstituted near the ampulla. 3. The central superior mesenteric vein and portal vein are abruptly effaced or occluded within the pancreatic head. 4. Constellation of findings is highly suspicious for pancreatic adenocarcinoma. 5. There is a rim enhancing  lesion of the right lobe of the liver abutting the gallbladder fossa, hepatic segment V, measuring 1.2 x 1.1 cm. Although difficult to definitively characterize due to small size, there are no clearly benign contrast enhancement characteristics and this is highly suspicious for metastatic disease. 6. Diffuse inflammatory fat stranding about the pancreas, consistent with acute pancreatitis. Findings within the pancreatic head head and neck could reflect phlegmonous change related to pancreatitis, however fullness of the pancreatic head neck has substantially increased when compared to more remote prior dated 11/22/2019 and adenocarcinoma is the diagnosis of exclusion. Electronically Signed   By: Alex  Bibbey M.D.   On: 06/15/2020 20:58    ASSESMENT:   *   Acute pancreatitis. MRCP shows lesion at pancreatic head and neck and dilated PD distal to the lesion.  SMV, PV approximately effaced versus occluded at the pancreatic head.  All together findings suspicious for adenocarcinoma.  There is a lesion in the right lobe of liver abutting GB fossa.  Changes of inflammation consistent with acute pancreatitis.  Possibly the findings at the pancreatic head and neck could reflect phlegmon.  Adenocarcinoma is diagnosis of exclusion.  CA 19-9 in process.   PLAN   *    Needs EUS, possible ERCP will discuss with MD. This likely will not happen until the soonest 5/5    Sarah Gribbin  06/16/2020, 12:18 PM Phone [phone] 

## 2020-06-16 NOTE — H&P (View-Only) (Signed)
Attending physician's note   I have taken an interval history, reviewed the chart and examined the patient. I agree with the Advanced Practitioner's note, impression, and recommendations as outlined.   MRCP completed yesterday and demonstrates 1.2 x 1.1 cm rim-enhancing lesion in the right lobe of the liver.  Pancreatic inflammation c/w acute pancreatitis but also hypoenhancing 3.2 x 2.4 cm lesion in the pancreatic head/neck with upstream dilation of the PD to 9 mm.  SMV and PVR abruptly effaced or occluded within the pancreatic head.  I discussed the MRCP findings with the patient at length today along with his wife by phone with patient present.  Certainly highest concern is pancreatic CA.  We discussed that diagnosis along with a full DDx today.  Needs EUS for tissue sampling and definitive diagnosis, along with staging.  - I will coordinate with the advanced biliary service re: timing of EUS - CA 19-9 pending - Will need CT chest for additional staging - Tolerating clears - Continue pain management and antiemetics as currently doing - Until p.o. intake picks up, I would increase IVF to LR at 250 cc/hour - GI service will continue to follow  Gerrit Heck, DO, FACG [phone] office                                                                      Daily Rounding Note  06/16/2020, 12:18 PM  LOS: 1 day   SUBJECTIVE:   Chief complaint: Pancreatitis.  Question pancreatic neoplasm.     Still has the same abdominal pain.  Nausea but no emesis.  Tolerating clear liquids.  Earlier today was not urinating much but now urine output has improved and the urine is quite pale yellow.  OBJECTIVE:         Vital signs in last 24 hours:    Temp:  [98.4 F (36.9 C)-99.4 F (37.4 C)] 98.4 F (36.9 C) (05/03 0950) Pulse Rate:  [68-98] 98 (05/03 0950) Resp:  [16-18] 16 (05/03 0950) BP: (110-169)/(78-85) 169/85 (05/03 0950) SpO2:  [96 %-98 %] 96 % (05/03 0950) Last BM Date:  06/13/20 Filed Weights   06/14/20 1818 06/15/20 0257  Weight: 87.5 kg 84.7 kg   General: NAD Heart: RRR Chest: No labored breathing Abdomen: Soft.  Tender.  Active bowel sounds.  No distention Extremities: No CCE Neuro/Psych: Oriented x3.  Alert.  Appropriate.  Intake/Output from previous day: 05/02 0701 - 05/03 0700 In: 2310.4 [P.O.:30; I.V.:2280.4] Out: 1500 [Urine:1500]  Intake/Output this shift: Total I/O In: 547.7 [I.V.:547.7] Out: -   Lab Results: Recent Labs    06/14/20 1859 06/15/20 0508 06/16/20 0141  WBC 6.5 6.0 5.9  HGB 14.0 13.3 12.6*  HCT 40.5 38.2* 37.5*  PLT 199 170 175   BMET Recent Labs    06/14/20 1859 06/15/20 0508 06/16/20 0141  NA 138 139 139  K 3.9 3.3* 3.9  CL 104 104 105  CO2 23 27 26   GLUCOSE 114* 122* 96  BUN 17 11 9   CREATININE 1.12 1.04 0.99  CALCIUM 9.7 9.5 9.3   LFT Recent Labs    06/14/20 1859 06/15/20 0508 06/16/20 0141  PROT 7.3 6.4* 6.1*  ALBUMIN 4.1 3.7 3.5  AST 15 13* 11*  ALT 14 13  11  ALKPHOS 75 76 72  BILITOT 0.2* 0.9 0.4   PT/INR No results for input(s): LABPROT, INR in the last 72 hours. Hepatitis Panel No results for input(s): HEPBSAG, HCVAB, HEPAIGM, HEPBIGM in the last 72 hours.  Studies/Results: CT ABDOMEN PELVIS W CONTRAST  Result Date: 06/14/2020 CLINICAL DATA:  Diffuse abdominal pain. Bilateral flank pain for 3 weeks. EXAM: CT ABDOMEN AND PELVIS WITH CONTRAST TECHNIQUE: Multidetector CT imaging of the abdomen and pelvis was performed using the standard protocol following bolus administration of intravenous contrast. CONTRAST:  142mL OMNIPAQUE IOHEXOL 300 MG/ML  SOLN COMPARISON:  11/22/2019 FINDINGS: Lower chest: The lung bases are clear. The heart size is normal. Hepatobiliary: The liver is normal. Normal gallbladder.There is no biliary ductal dilation. Pancreas: There are inflammatory changes about the pancreas. There is pancreatic ductal dilatation with a possible abrupt cutoff in the distal  pancreatic body. There is fullness of the pancreatic head with a heterogeneous appearance and areas of decreased attenuation. Spleen: Unremarkable. Adrenals/Urinary Tract: --Adrenal glands: Unremarkable. --Right kidney/ureter: No hydronephrosis or radiopaque kidney stones. --Left kidney/ureter: No hydronephrosis or radiopaque kidney stones. --Urinary bladder: Unremarkable. Stomach/Bowel: --Stomach/Duodenum: No hiatal hernia or other gastric abnormality. Normal duodenal course and caliber. --Small bowel: Unremarkable. --Colon: Unremarkable. --Appendix: Normal. Vascular/Lymphatic: Atherosclerotic calcification is present within the non-aneurysmal abdominal aorta, without hemodynamically significant stenosis. --No retroperitoneal lymphadenopathy. --No mesenteric lymphadenopathy. --No pelvic or inguinal lymphadenopathy. Reproductive: Unremarkable Other: No ascites or free air. The abdominal wall is normal. Musculoskeletal. No acute displaced fractures. IMPRESSION: 1. Abnormal appearance of the pancreas with adjacent fat stranding, pancreatic ductal dilatation, and fullness of the pancreatic head and distal pancreatic body. Findings may be secondary to acute pancreatitis. However, the fullness of the pancreatic head and pancreatic ductal dilatation is suspicious for an underlying obstructing lesion. As such, follow-up with a nonemergent contrast enhanced MRI is recommended for further evaluation of this finding. 2. No other potentially acute abnormality detected in the abdomen or pelvis. Electronically Signed   By: Constance Holster M.D.   On: 06/14/2020 20:19   MR ABDOMEN MRCP W WO CONTAST  Result Date: 06/15/2020 CLINICAL DATA:  Abnormal pancreas on prior CT, possible acute pancreatitis versus mass EXAM: MRI ABDOMEN WITHOUT AND WITH CONTRAST (INCLUDING MRCP) TECHNIQUE: Multiplanar multisequence MR imaging of the abdomen was performed both before and after the administration of intravenous contrast. Heavily  T2-weighted images of the biliary and pancreatic ducts were obtained, and three-dimensional MRCP images were rendered by post processing. CONTRAST:  8.24mL GADAVIST GADOBUTROL 1 MMOL/ML IV SOLN COMPARISON:  CT abdomen pelvis, 06/14/2020, 11/22/2019 FINDINGS: Lower chest: No acute findings. Hepatobiliary: There is a rim enhancing lesion of the right lobe of the liver abutting the gallbladder fossa, hepatic segment V, measuring 1.2 x 1.1 cm (series 2, image 42). No biliary ductal dilatation. Pancreas: Diffuse inflammatory fat stranding about the pancreas. There is a hypoenhancing lesion in the superior pancreatic head and neck measuring approximately 3.2 x 2.4 cm (series 20, image 48). Distal to this lesion, there is gross dilatation of the pancreatic duct, measuring up to 9 mm in caliber. The duct is not visualized through the pancreatic head, but is reconstituted near the ampulla. The central superior mesenteric vein and portal vein are abruptly effaced or occluded within the pancreatic head. Spleen:  Within normal limits in size and appearance. Adrenals/Urinary Tract: No masses identified. No evidence of hydronephrosis. Stomach/Bowel: Visualized portions within the abdomen are unremarkable. Vascular/Lymphatic: No pathologically enlarged lymph nodes identified. No abdominal aortic aneurysm demonstrated. Other:  None. Musculoskeletal: No suspicious bone lesions identified. IMPRESSION: 1. There is a hypoenhancing lesion in the superior pancreatic head and neck measuring approximately 3.2 x 2.4 cm. 2. Distal to this lesion, there is gross dilatation of the pancreatic duct, measuring up to 9 mm in caliber. The duct is not visualized through the pancreatic head, but is reconstituted near the ampulla. 3. The central superior mesenteric vein and portal vein are abruptly effaced or occluded within the pancreatic head. 4. Constellation of findings is highly suspicious for pancreatic adenocarcinoma. 5. There is a rim enhancing  lesion of the right lobe of the liver abutting the gallbladder fossa, hepatic segment V, measuring 1.2 x 1.1 cm. Although difficult to definitively characterize due to small size, there are no clearly benign contrast enhancement characteristics and this is highly suspicious for metastatic disease. 6. Diffuse inflammatory fat stranding about the pancreas, consistent with acute pancreatitis. Findings within the pancreatic head head and neck could reflect phlegmonous change related to pancreatitis, however fullness of the pancreatic head neck has substantially increased when compared to more remote prior dated 11/22/2019 and adenocarcinoma is the diagnosis of exclusion. Electronically Signed   By: Eddie Candle M.D.   On: 06/15/2020 20:58    ASSESMENT:   *   Acute pancreatitis. MRCP shows lesion at pancreatic head and neck and dilated PD distal to the lesion.  SMV, PV approximately effaced versus occluded at the pancreatic head.  All together findings suspicious for adenocarcinoma.  There is a lesion in the right lobe of liver abutting GB fossa.  Changes of inflammation consistent with acute pancreatitis.  Possibly the findings at the pancreatic head and neck could reflect phlegmon.  Adenocarcinoma is diagnosis of exclusion.  CA 19-9 in process.   PLAN   *    Needs EUS, possible ERCP will discuss with MD. This likely will not happen until the soonest 5/5    Azucena Freed  06/16/2020, 12:18 PM Phone [phone]

## 2020-06-16 NOTE — Plan of Care (Signed)
  Problem: Education: Goal: Knowledge of General Education information will improve Description: Including pain rating scale, medication(s)/side effects and non-pharmacologic comfort measures Outcome: Progressing   Problem: Clinical Measurements: Goal: Will remain free from infection Outcome: Progressing Goal: Diagnostic test results will improve Outcome: Progressing   Problem: Coping: Goal: Level of anxiety will decrease Outcome: Progressing   Problem: Pain Managment: Goal: General experience of comfort will improve Outcome: Progressing

## 2020-06-16 NOTE — Progress Notes (Signed)
PROGRESS NOTE    Timothy Page  [id number] DOB: 1955-03-12 DOA: 06/14/2020 PCP: Tammi Sou, MD  Brief Narrative: 64 year old male with history of type 2 diabetes mellitus, hypertension, dyslipidemia presented to the ED with abdominal pain, reports intermittent abdominal pain for the last 3 weeks, this has progressively worsened, located in the epigastrium and left upper quadrant. -CT abdomen noted abnormal appearance of the pancreas with adjacent fat stranding,pancreatic ductal dilatation, and fullness of the pancreatic head and distal pancreatic body  Assessment & Plan:   Subacute pancreatitis Pancreatic head mass with liver lesion -Supportive care, IV fluids, start clears, GI following, -Denies alcoholism, no gallstones noted on CT -MRCP notes 3.2X 2.4 cm pancreatic head lesion with enhancing lesion in the right lobe of the liver concerning for pancreatic malignancy -CA 19-9 pending -Plan for EUS/biopsy this admission  Hypothyroidism -Continue Synthroid  Essential hypertension -Continue amlodipine and clonidine -Ramipril on hold  Type 2 diabetes mellitus -On Lantus 20 units at baseline, currently on 7 units -CBGs are stable, will need higher doses as diet is advanced  History of CVA -Continue aspirin  DVT prophylaxis: Heparin subcutaneous Code Status: Full code Family Communication: Discussed with patient and wife at bedside Disposition Plan:  Status is: Inpatient  Remains inpatient appropriate because:Inpatient level of care appropriate due to severity of illness   Dispo: The patient is from: Home              Anticipated d/c is to: Home              Patient currently is not medically stable to d/c.   Difficult to place patient No  Consultants:   Gastroenterology   Procedures:   Antimicrobials:    Subjective: -Continues to have upper abdominal discomfort that radiates to the back, reports poor urine output yesterday  Objective: Vitals:    06/15/20 1644 06/15/20 2037 06/16/20 0358 06/16/20 0950  BP: 127/84 (!) 158/85 110/78 (!) 169/85  Pulse: 85 85 68 98  Resp: 18 16 16 16   Temp: 99.4 F (37.4 C) 99 F (37.2 C) 98.7 F (37.1 C) 98.4 F (36.9 C)  TempSrc:  Oral Oral Oral  SpO2: 98% 98% 96% 96%  Weight:      Height:        Intake/Output Summary (Last 24 hours) at 06/16/2020 1341 Last data filed at 06/16/2020 1300 Gross per 24 hour  Intake 3075.24 ml  Output 2050 ml  Net 1025.24 ml   Filed Weights   06/14/20 1818 06/15/20 0257  Weight: 87.5 kg 84.7 kg    Examination:  General exam: Pleasant male laying in bed, AAOx3, no distress HEENT: No JVD CVS: S1-S2, regular rate rhythm Lungs: Decreased breath sounds the bases otherwise clear Abdomen: Soft, mild epigastric and left-sided tenderness, bowel sounds present Extremities: No edema  Skin: No rashes on exposed skin Psychiatry: Judgement and insight appear normal    Data Reviewed:   CBC: Recent Labs  Lab 06/12/20 0926 06/14/20 1859 06/15/20 0508 06/16/20 0141  WBC 5.3 6.5 6.0 5.9  NEUTROABS 3.7 4.5  --   --   HGB 13.7 14.0 13.3 12.6*  HCT 39.9 40.5 38.2* 37.5*  MCV 89.3 88.6 88.4 91.0  PLT 175.0 199 170 867   Basic Metabolic Panel: Recent Labs  Lab 06/12/20 0926 06/14/20 1859 06/15/20 0508 06/16/20 0141  NA 138 138 139 139  K 4.1 3.9 3.3* 3.9  CL 105 104 104 105  CO2 26 23 27 26   GLUCOSE 155* 114*  122* 96  BUN 17 17 11 9   CREATININE 1.08 1.12 1.04 0.99  CALCIUM 9.6 9.7 9.5 9.3  MG  --   --  1.9  --    GFR: Estimated Creatinine Clearance: 76.4 mL/min (by C-G formula based on SCr of 0.99 mg/dL). Liver Function Tests: Recent Labs  Lab 06/12/20 0926 06/14/20 1859 06/15/20 0508 06/16/20 0141  AST 14 15 13* 11*  ALT 16 14 13 11   ALKPHOS 81 75 76 72  BILITOT 0.5 0.2* 0.9 0.4  PROT 7.0 7.3 6.4* 6.1*  ALBUMIN 4.2 4.1 3.7 3.5   Recent Labs  Lab 06/12/20 0926 06/14/20 1859  LIPASE 15.0 66*   No results for input(s): AMMONIA in the  last 168 hours. Coagulation Profile: No results for input(s): INR, PROTIME in the last 168 hours. Cardiac Enzymes: No results for input(s): CKTOTAL, CKMB, CKMBINDEX, TROPONINI in the last 168 hours. BNP (last 3 results) No results for input(s): PROBNP in the last 8760 hours. HbA1C: No results for input(s): HGBA1C in the last 72 hours. CBG: Recent Labs  Lab 06/15/20 2040 06/15/20 2351 06/16/20 0356 06/16/20 0718 06/16/20 1113  GLUCAP 122* 105* 91 112* 206*   Lipid Profile: Recent Labs    06/15/20 0508  CHOL 148  HDL 34*  LDLCALC 103*  TRIG 55  CHOLHDL 4.4   Thyroid Function Tests: No results for input(s): TSH, T4TOTAL, FREET4, T3FREE, THYROIDAB in the last 72 hours. Anemia Panel: No results for input(s): VITAMINB12, FOLATE, FERRITIN, TIBC, IRON, RETICCTPCT in the last 72 hours. Urine analysis:    Component Value Date/Time   COLORURINE YELLOW 06/14/2020 2004   APPEARANCEUR CLEAR 06/14/2020 2004   LABSPEC 1.010 06/14/2020 2004   PHURINE 6.0 06/14/2020 2004   GLUCOSEU NEGATIVE 06/14/2020 2004   GLUCOSEU NEGATIVE 09/13/2016 0946   HGBUR NEGATIVE 06/14/2020 2004   HGBUR negative 05/05/2009 1323   [place] 06/14/2020 2004   BILIRUBINUR Negative 11/22/2019 [place] 06/14/2020 2004   PROTEINUR NEGATIVE 06/14/2020 2004   UROBILINOGEN 0.2 11/22/2019 1048   UROBILINOGEN 0.2 09/13/2016 0946   NITRITE NEGATIVE 06/14/2020 2004   LEUKOCYTESUR NEGATIVE 06/14/2020 2004   Sepsis Labs: @LABRCNTIP (procalcitonin:4,lacticidven:4)  ) Recent Results (from the past 240 hour(s))  SARS CORONAVIRUS 2 (TAT 6-24 HRS) Nasopharyngeal Nasopharyngeal Swab     Status: None   Collection Time: 06/15/20 12:09 AM   Specimen: Nasopharyngeal Swab  Result Value Ref Range Status   SARS Coronavirus 2 NEGATIVE NEGATIVE Final    Comment: (NOTE) SARS-CoV-2 target nucleic acids are NOT DETECTED.  The SARS-CoV-2 RNA is generally detectable in upper and lower respiratory  specimens during the acute phase of infection. Negative results do not preclude SARS-CoV-2 infection, do not rule out co-infections with other pathogens, and should not be used as the sole basis for treatment or other patient management decisions. Negative results must be combined with clinical observations, patient history, and epidemiological information. The expected result is Negative.  Fact Sheet for Patients: SugarRoll.be  Fact Sheet for Healthcare Providers: https://www.woods-mathews.com/  This test is not yet approved or cleared by the [place] FDA and  has been authorized for detection and/or diagnosis of SARS-CoV-2 by FDA under an Emergency Use Authorization (EUA). This EUA will remain  in effect (meaning this test can be used) for the duration of the COVID-19 declaration under Se ction 564(b)(1) of the Act, 21 U.S.C. section 360bbb-3(b)(1), unless the authorization is terminated or revoked sooner.  Performed at [facility], [place]  [address], [place], [place] [address]      Radiology Studies: CT ABDOMEN PELVIS W CONTRAST  Result Date: 06/14/2020 CLINICAL DATA:  Diffuse abdominal pain. Bilateral flank pain for 3 weeks. EXAM: CT ABDOMEN AND PELVIS WITH CONTRAST TECHNIQUE: Multidetector CT imaging of the abdomen and pelvis was performed using the standard protocol following bolus administration of intravenous contrast. CONTRAST:  169mL OMNIPAQUE IOHEXOL 300 MG/ML  SOLN COMPARISON:  11/22/2019 FINDINGS: Lower chest: The lung bases are clear. The heart size is normal. Hepatobiliary: The liver is normal. Normal gallbladder.There is no biliary ductal dilation. Pancreas: There are inflammatory changes about the pancreas. There is pancreatic ductal dilatation with a possible abrupt cutoff in the distal pancreatic body. There is fullness of the pancreatic head with a heterogeneous appearance and areas of decreased attenuation. Spleen:  Unremarkable. Adrenals/Urinary Tract: --Adrenal glands: Unremarkable. --Right kidney/ureter: No hydronephrosis or radiopaque kidney stones. --Left kidney/ureter: No hydronephrosis or radiopaque kidney stones. --Urinary bladder: Unremarkable. Stomach/Bowel: --Stomach/Duodenum: No hiatal hernia or other gastric abnormality. Normal duodenal course and caliber. --Small bowel: Unremarkable. --Colon: Unremarkable. --Appendix: Normal. Vascular/Lymphatic: Atherosclerotic calcification is present within the non-aneurysmal abdominal aorta, without hemodynamically significant stenosis. --No retroperitoneal lymphadenopathy. --No mesenteric lymphadenopathy. --No pelvic or inguinal lymphadenopathy. Reproductive: Unremarkable Other: No ascites or free air. The abdominal wall is normal. Musculoskeletal. No acute displaced fractures. IMPRESSION: 1. Abnormal appearance of the pancreas with adjacent fat stranding, pancreatic ductal dilatation, and fullness of the pancreatic head and distal pancreatic body. Findings may be secondary to acute pancreatitis. However, the fullness of the pancreatic head and pancreatic ductal dilatation is suspicious for an underlying obstructing lesion. As such, follow-up with a nonemergent contrast enhanced MRI is recommended for further evaluation of this finding. 2. No other potentially acute abnormality detected in the abdomen or pelvis. Electronically Signed   By: Constance Holster M.D.   On: 06/14/2020 20:19   MR ABDOMEN MRCP W WO CONTAST  Result Date: 06/15/2020 CLINICAL DATA:  Abnormal pancreas on prior CT, possible acute pancreatitis versus mass EXAM: MRI ABDOMEN WITHOUT AND WITH CONTRAST (INCLUDING MRCP) TECHNIQUE: Multiplanar multisequence MR imaging of the abdomen was performed both before and after the administration of intravenous contrast. Heavily T2-weighted images of the biliary and pancreatic ducts were obtained, and three-dimensional MRCP images were rendered by post processing.  CONTRAST:  8.60mL GADAVIST GADOBUTROL 1 MMOL/ML IV SOLN COMPARISON:  CT abdomen pelvis, 06/14/2020, 11/22/2019 FINDINGS: Lower chest: No acute findings. Hepatobiliary: There is a rim enhancing lesion of the right lobe of the liver abutting the gallbladder fossa, hepatic segment V, measuring 1.2 x 1.1 cm (series 2, image 42). No biliary ductal dilatation. Pancreas: Diffuse inflammatory fat stranding about the pancreas. There is a hypoenhancing lesion in the superior pancreatic head and neck measuring approximately 3.2 x 2.4 cm (series 20, image 48). Distal to this lesion, there is gross dilatation of the pancreatic duct, measuring up to 9 mm in caliber. The duct is not visualized through the pancreatic head, but is reconstituted near the ampulla. The central superior mesenteric vein and portal vein are abruptly effaced or occluded within the pancreatic head. Spleen:  Within normal limits in size and appearance. Adrenals/Urinary Tract: No masses identified. No evidence of hydronephrosis. Stomach/Bowel: Visualized portions within the abdomen are unremarkable. Vascular/Lymphatic: No pathologically enlarged lymph nodes identified. No abdominal aortic aneurysm demonstrated. Other:  None. Musculoskeletal: No suspicious bone lesions identified. IMPRESSION: 1. There is a hypoenhancing lesion in the superior pancreatic head and neck measuring approximately 3.2 x 2.4 cm. 2. Distal to  this lesion, there is gross dilatation of the pancreatic duct, measuring up to 9 mm in caliber. The duct is not visualized through the pancreatic head, but is reconstituted near the ampulla. 3. The central superior mesenteric vein and portal vein are abruptly effaced or occluded within the pancreatic head. 4. Constellation of findings is highly suspicious for pancreatic adenocarcinoma. 5. There is a rim enhancing lesion of the right lobe of the liver abutting the gallbladder fossa, hepatic segment V, measuring 1.2 x 1.1 cm. Although difficult to  definitively characterize due to small size, there are no clearly benign contrast enhancement characteristics and this is highly suspicious for metastatic disease. 6. Diffuse inflammatory fat stranding about the pancreas, consistent with acute pancreatitis. Findings within the pancreatic head head and neck could reflect phlegmonous change related to pancreatitis, however fullness of the pancreatic head neck has substantially increased when compared to more remote prior dated 11/22/2019 and adenocarcinoma is the diagnosis of exclusion. Electronically Signed   By: Eddie Candle M.D.   On: 06/15/2020 20:58    Scheduled Meds: . amLODipine  10 mg Oral Daily  . cloNIDine  0.2 mg Oral QHS  . heparin  5,000 Units Subcutaneous Q8H  . insulin aspart  0-15 Units Subcutaneous Q4H  . insulin glargine  7 Units Subcutaneous Q2200  . levothyroxine  137 mcg Oral Q0600   Continuous Infusions: . lactated ringers 125 mL/hr at 06/16/20 1318     LOS: 1 day    Time spent: 52min  Domenic Polite, MD [facility]  06/16/2020, 1:41 PM

## 2020-06-16 NOTE — Plan of Care (Signed)
  Problem: Activity: Goal: Risk for activity intolerance will decrease Outcome: Progressing   

## 2020-06-17 ENCOUNTER — Inpatient Hospital Stay (HOSPITAL_COMMUNITY): Payer: 59

## 2020-06-17 DIAGNOSIS — R933 Abnormal findings on diagnostic imaging of other parts of digestive tract: Secondary | ICD-10-CM | POA: Diagnosis not present

## 2020-06-17 DIAGNOSIS — I10 Essential (primary) hypertension: Secondary | ICD-10-CM | POA: Diagnosis not present

## 2020-06-17 DIAGNOSIS — E040 Nontoxic diffuse goiter: Secondary | ICD-10-CM

## 2020-06-17 DIAGNOSIS — E119 Type 2 diabetes mellitus without complications: Secondary | ICD-10-CM | POA: Diagnosis not present

## 2020-06-17 DIAGNOSIS — K8689 Other specified diseases of pancreas: Secondary | ICD-10-CM | POA: Diagnosis not present

## 2020-06-17 DIAGNOSIS — K859 Acute pancreatitis without necrosis or infection, unspecified: Secondary | ICD-10-CM | POA: Diagnosis not present

## 2020-06-17 LAB — COMPREHENSIVE METABOLIC PANEL
ALT: 10 U/L (ref 0–44)
AST: 11 U/L — ABNORMAL LOW (ref 15–41)
Albumin: 3.3 g/dL — ABNORMAL LOW (ref 3.5–5.0)
Alkaline Phosphatase: 68 U/L (ref 38–126)
Anion gap: 8 (ref 5–15)
BUN: 8 mg/dL (ref 8–23)
CO2: 26 mmol/L (ref 22–32)
Calcium: 9.2 mg/dL (ref 8.9–10.3)
Chloride: 105 mmol/L (ref 98–111)
Creatinine, Ser: 1 mg/dL (ref 0.61–1.24)
GFR, Estimated: >60 mL/min (ref >60)
Glucose, Bld: 121 mg/dL — ABNORMAL HIGH (ref 70–99)
Potassium: 3.9 mmol/L (ref 3.5–5.1)
Sodium: 139 mmol/L (ref 135–145)
Total Bilirubin: 0.6 mg/dL (ref 0.3–1.2)
Total Protein: 5.8 g/dL — ABNORMAL LOW (ref 6.5–8.1)

## 2020-06-17 LAB — GLUCOSE, CAPILLARY
Glucose-Capillary: 124 mg/dL — ABNORMAL HIGH (ref 70–99)
Glucose-Capillary: 119 mg/dL — ABNORMAL HIGH (ref 70–99)
Glucose-Capillary: 137 mg/dL — ABNORMAL HIGH (ref 70–99)
Glucose-Capillary: 128 mg/dL — ABNORMAL HIGH (ref 70–99)
Glucose-Capillary: 193 mg/dL — ABNORMAL HIGH (ref 70–99)

## 2020-06-17 LAB — CBC
HCT: 36.4 % — ABNORMAL LOW (ref 39.0–52.0)
Hemoglobin: 12.1 g/dL — ABNORMAL LOW (ref 13.0–17.0)
MCH: 30 pg (ref 26.0–34.0)
MCHC: 33.2 g/dL (ref 30.0–36.0)
MCV: 90.1 fL (ref 80.0–100.0)
Platelets: 171 10*3/uL (ref 150–400)
RBC: 4.04 MIL/uL — ABNORMAL LOW (ref 4.22–5.81)
RDW: 12 % (ref 11.5–15.5)
WBC: 5.4 10*3/uL (ref 4.0–10.5)
nRBC: 0 % (ref 0.0–0.2)

## 2020-06-17 LAB — CANCER ANTIGEN 19-9: CA 19-9: 28 U/mL (ref 0–35)

## 2020-06-17 NOTE — Progress Notes (Addendum)
Attending physician's note   I have taken an interval history, reviewed the chart and examined the patient. I agree with the Advanced Practitioner's note, impression, and recommendations as outlined.   Tolerating liquids.  Does have some mild postprandial pain, but would like to keep going with liquids for now.  Labs otherwise largely unremarkable to include AST/ALT/ALP 12/25/1966 and T bili 0.6.  CA 19-9 normal at 28.  -CT chest to be completed today -Plan for EUS tomorrow with Dr. Rush Landmark.  We again discussed the risks, benefits, alternatives of EUS and biopsy, and he wishes to proceed as scheduled.  [place], DO, FACG [phone] office                                                                      Daily Rounding Note  06/17/2020, 12:27 PM  LOS: 2 days   SUBJECTIVE:   Chief complaint: Pancreatitis, pancreatic mass.  Still having upper abdominal pain.  Pain triggered by PO, even small amounts but keeping down clears.  Pain also triggered by movement. Nausea, no emesis.  No BM for a few days.  At home had been using laxatives in attempt to relieve the pain.  Laxatives led to effective evacuation but no relief of pain.  He is passing flatus..  OBJECTIVE:         Vital signs in last 24 hours:    Temp:  [97.9 F (36.6 C)-98.4 F (36.9 C)] 98.4 F (36.9 C) (05/04 0901) Pulse Rate:  [57-81] 71 (05/04 0901) Resp:  [16-18] 16 (05/04 0901) BP: (110-143)/(67-85) 143/83 (05/04 0901) SpO2:  [94 %-97 %] 96 % (05/04 0901) Last BM Date: 06/13/20 Filed Weights   06/14/20 1818 06/15/20 0257  Weight: 87.5 kg 84.7 kg   General: Looks better but not well.  Looks uncomfortable especially when asked to lean forward for lung exam Heart: RRR. Chest: No labored breathing or cough.  Lungs clear with good breath sounds. Abdomen: Soft, diffusely tender to light pressure.  Bowel sounds active.  No tinkling or tympanitic sounds. Extremities: No CCE Neuro/Psych: Alert.  Oriented  x3.  No gross deficits or tremors  Intake/Output from previous day: 05/03 0701 - 05/04 0700 In: 4141.3 [P.O.:1214; I.V.:2927.3] Out: 2670 [Urine:2670]  Intake/Output this shift: Total I/O In: 560 [P.O.:560] Out: -   Lab Results: Recent Labs    06/15/20 0508 06/16/20 0141 06/17/20 0223  WBC 6.0 5.9 5.4  HGB 13.3 12.6* 12.1*  HCT 38.2* 37.5* 36.4*  PLT 170 175 171   BMET Recent Labs    06/15/20 0508 06/16/20 0141 06/17/20 0223  NA 139 139 139  K 3.3* 3.9 3.9  CL 104 105 105  CO2 27 26 26   GLUCOSE 122* 96 121*  BUN 11 9 8   CREATININE 1.04 0.99 1.00  CALCIUM 9.5 9.3 9.2   LFT Recent Labs    06/15/20 0508 06/16/20 0141 06/17/20 0223  PROT 6.4* 6.1* 5.8*  ALBUMIN 3.7 3.5 3.3*  AST 13* 11* 11*  ALT 13 11 10   ALKPHOS 76 72 68  BILITOT 0.9 0.4 0.6   PT/INR No results for input(s): LABPROT, INR in the last 72 hours. Hepatitis Panel No results for input(s): HEPBSAG, HCVAB, HEPAIGM, HEPBIGM in the last 72 hours.  Studies/Results: MR ABDOMEN MRCP W WO CONTAST  Result Date: 06/15/2020 CLINICAL DATA:  Abnormal pancreas on prior CT, possible acute pancreatitis versus mass EXAM: MRI ABDOMEN WITHOUT AND WITH CONTRAST (INCLUDING MRCP) TECHNIQUE: Multiplanar multisequence MR imaging of the abdomen was performed both before and after the administration of intravenous contrast. Heavily T2-weighted images of the biliary and pancreatic ducts were obtained, and three-dimensional MRCP images were rendered by post processing. CONTRAST:  8.56mL GADAVIST GADOBUTROL 1 MMOL/ML IV SOLN COMPARISON:  CT abdomen pelvis, 06/14/2020, 11/22/2019 FINDINGS: Lower chest: No acute findings. Hepatobiliary: There is a rim enhancing lesion of the right lobe of the liver abutting the gallbladder fossa, hepatic segment V, measuring 1.2 x 1.1 cm (series 2, image 42). No biliary ductal dilatation. Pancreas: Diffuse inflammatory fat stranding about the pancreas. There is a hypoenhancing lesion in the superior  pancreatic head and neck measuring approximately 3.2 x 2.4 cm (series 20, image 48). Distal to this lesion, there is gross dilatation of the pancreatic duct, measuring up to 9 mm in caliber. The duct is not visualized through the pancreatic head, but is reconstituted near the ampulla. The central superior mesenteric vein and portal vein are abruptly effaced or occluded within the pancreatic head. Spleen:  Within normal limits in size and appearance. Adrenals/Urinary Tract: No masses identified. No evidence of hydronephrosis. Stomach/Bowel: Visualized portions within the abdomen are unremarkable. Vascular/Lymphatic: No pathologically enlarged lymph nodes identified. No abdominal aortic aneurysm demonstrated. Other:  None. Musculoskeletal: No suspicious bone lesions identified. IMPRESSION: 1. There is a hypoenhancing lesion in the superior pancreatic head and neck measuring approximately 3.2 x 2.4 cm. 2. Distal to this lesion, there is gross dilatation of the pancreatic duct, measuring up to 9 mm in caliber. The duct is not visualized through the pancreatic head, but is reconstituted near the ampulla. 3. The central superior mesenteric vein and portal vein are abruptly effaced or occluded within the pancreatic head. 4. Constellation of findings is highly suspicious for pancreatic adenocarcinoma. 5. There is a rim enhancing lesion of the right lobe of the liver abutting the gallbladder fossa, hepatic segment V, measuring 1.2 x 1.1 cm. Although difficult to definitively characterize due to small size, there are no clearly benign contrast enhancement characteristics and this is highly suspicious for metastatic disease. 6. Diffuse inflammatory fat stranding about the pancreas, consistent with acute pancreatitis. Findings within the pancreatic head head and neck could reflect phlegmonous change related to pancreatitis, however fullness of the pancreatic head neck has substantially increased when compared to more remote  prior dated 11/22/2019 and adenocarcinoma is the diagnosis of exclusion. Electronically Signed   By: Eddie Candle M.D.   On: 06/15/2020 20:58    ASSESMENT:   *    acute pancreatitis. Pancreatic head/neck mass, suspicion for pancreatic carcinoma though CA 19-9 level normal.  *   Effacement vs occlusion of SMV and PV.   PLAN   *   Patient has ERCP/EUS set up for tomorrow at 1 PM with Dr. Rush Landmark.    *   CT chest ordered to complete staging for suspected carcinoma    Azucena Freed  06/17/2020, 12:27 PM Phone [phone]

## 2020-06-17 NOTE — Plan of Care (Signed)
  Problem: Education: Goal: Knowledge of General Education information will improve Description Including pain rating scale, medication(s)/side effects and non-pharmacologic comfort measures Outcome: Progressing   

## 2020-06-17 NOTE — Progress Notes (Signed)
[facility] PROGRESS NOTE    Progress Note  Timothy Page  QMV:[id number] DOB: [birth year] DOA: 06/14/2020 PCP: Tammi Sou, MD     Brief Narrative:   Timothy Page is an 65 y.o. male past medical history of type 2 diabetes mellitus, essential hypertension comes in for abdominal pain was diagnosed with acute pancreatitis.  CT scan confirms it showed normal-appearing fat adjacent stranding.   Assessment/Plan:   Acute pancreatitis: Treated conservatively with IV fluids and IV narcotics. MRCP showed a 3.2 x 2.4 cm pancreatic head lesion. enhancing lesion of the right lobe of the liver abutting the gallbladder fossa, hepatic segment V, measuring 1.2 x 1.1 cm. Although difficult to definitively characterize due to small size GI was consulted markers were ordered they are concerned about malignancy. GI is planning for EUS and biopsy on this admission., CA 19-9 is pending. CT scan of the chest pending. Continue IV fluids acute rate. Further management per GI.  Hypothyroidism: Continue Synthroid.  Essential hypertension: Continue to hold ACE inhibitor, continue current dose of amlodipine and clonidine.  Controlled type 2 diabetes mellitus without complication, without long-term current use of insulin (HCC)with hyperglycemia: Currently on Lantus plus sliding scale. Blood glucose fairly controlled.  History of CVA: Continue aspirin.    DVT prophylaxis: lovenox Family Communication:none Status is: Inpatient  Remains inpatient appropriate because:Hemodynamically unstable   Dispo: The patient is from: Home              Anticipated d/c is to: Home              Patient currently is not medically stable to d/c.   Difficult to place patient No        Code Status:     Code Status Orders  (From admission, onward)         Start     Ordered   06/15/20 0318  Full code  Continuous        06/15/20 0318        Code Status History    Date Active Date  Inactive Code Status Order ID Comments User Context   05/11/2017 0346 05/13/2017 1500 Full Code [id number]  Vianne Bulls, MD ED   05/07/2017 1456 05/09/2017 1330 Full Code [id number]  Velna Hatchet, MD Inpatient   07/31/2016 1646 08/05/2016 1944 Full Code [id number]  Elease Hashimoto ED   Advance Care Planning Activity        IV Access:    Peripheral IV   Procedures and diagnostic studies:   MR ABDOMEN MRCP W WO CONTAST  Result Date: 06/15/2020 CLINICAL DATA:  Abnormal pancreas on prior CT, possible acute pancreatitis versus mass EXAM: MRI ABDOMEN WITHOUT AND WITH CONTRAST (INCLUDING MRCP) TECHNIQUE: Multiplanar multisequence MR imaging of the abdomen was performed both before and after the administration of intravenous contrast. Heavily T2-weighted images of the biliary and pancreatic ducts were obtained, and three-dimensional MRCP images were rendered by post processing. CONTRAST:  8.41mL GADAVIST GADOBUTROL 1 MMOL/ML IV SOLN COMPARISON:  CT abdomen pelvis, 06/14/2020, 11/22/2019 FINDINGS: Lower chest: No acute findings. Hepatobiliary: There is a rim enhancing lesion of the right lobe of the liver abutting the gallbladder fossa, hepatic segment V, measuring 1.2 x 1.1 cm (series 2, image 42). No biliary ductal dilatation. Pancreas: Diffuse inflammatory fat stranding about the pancreas. There is a hypoenhancing lesion in the superior pancreatic head and neck measuring approximately 3.2 x 2.4 cm (series 20, image 48). Distal to this lesion, there is gross  dilatation of the pancreatic duct, measuring up to 9 mm in caliber. The duct is not visualized through the pancreatic head, but is reconstituted near the ampulla. The central superior mesenteric vein and portal vein are abruptly effaced or occluded within the pancreatic head. Spleen:  Within normal limits in size and appearance. Adrenals/Urinary Tract: No masses identified. No evidence of hydronephrosis. Stomach/Bowel: Visualized portions within  the abdomen are unremarkable. Vascular/Lymphatic: No pathologically enlarged lymph nodes identified. No abdominal aortic aneurysm demonstrated. Other:  None. Musculoskeletal: No suspicious bone lesions identified. IMPRESSION: 1. There is a hypoenhancing lesion in the superior pancreatic head and neck measuring approximately 3.2 x 2.4 cm. 2. Distal to this lesion, there is gross dilatation of the pancreatic duct, measuring up to 9 mm in caliber. The duct is not visualized through the pancreatic head, but is reconstituted near the ampulla. 3. The central superior mesenteric vein and portal vein are abruptly effaced or occluded within the pancreatic head. 4. Constellation of findings is highly suspicious for pancreatic adenocarcinoma. 5. There is a rim enhancing lesion of the right lobe of the liver abutting the gallbladder fossa, hepatic segment V, measuring 1.2 x 1.1 cm. Although difficult to definitively characterize due to small size, there are no clearly benign contrast enhancement characteristics and this is highly suspicious for metastatic disease. 6. Diffuse inflammatory fat stranding about the pancreas, consistent with acute pancreatitis. Findings within the pancreatic head head and neck could reflect phlegmonous change related to pancreatitis, however fullness of the pancreatic head neck has substantially increased when compared to more remote prior dated 11/22/2019 and adenocarcinoma is the diagnosis of exclusion. Electronically Signed   By: Eddie Candle M.D.   On: 06/15/2020 20:58     Medical Consultants:    None.   Subjective:    Timothy Page feels better no new complaints.  Objective:    Vitals:   06/16/20 1614 06/16/20 2019 06/17/20 0452 06/17/20 0901  BP: 124/85 (!) 143/84 110/67 (!) 143/83  Pulse: 73 81 (!) 57 71  Resp: 18 18 16 16   Temp: 98.1 F (36.7 C) 98 F (36.7 C) 97.9 F (36.6 C) 98.4 F (36.9 C)  TempSrc: Oral Oral Oral Oral  SpO2: 97% 94% 95% 96%  Weight:       Height:       SpO2: 96 %   Intake/Output Summary (Last 24 hours) at 06/17/2020 1026 Last data filed at 06/17/2020 0800 Gross per 24 hour  Intake 4153.65 ml  Output 2220 ml  Net 1933.65 ml   Filed Weights   06/14/20 1818 06/15/20 0257  Weight: 87.5 kg 84.7 kg    Exam: General exam: In no acute distress. Respiratory system: Good air movement and clear to auscultation. Cardiovascular system: S1 & S2 heard, RRR. No JVD. Gastrointestinal system: Abdomen is nondistended, soft and nontender.  Extremities: No pedal edema. Skin: No rashes, lesions or ulcers Psychiatry: Judgement and insight appear normal. Mood & affect appropriate.    Data Reviewed:    Labs: Basic Metabolic Panel: Recent Labs  Lab 06/12/20 0926 06/14/20 1859 06/15/20 0508 06/16/20 0141 06/17/20 0223  NA 138 138 139 139 139  K 4.1 3.9 3.3* 3.9 3.9  CL 105 104 104 105 105  CO2 26 23 27 26 26   GLUCOSE 155* 114* 122* 96 121*  BUN 17 17 11 9 8   CREATININE 1.08 1.12 1.04 0.99 1.00  CALCIUM 9.6 9.7 9.5 9.3 9.2  MG  --   --  1.9  --   --  GFR Estimated Creatinine Clearance: 75.6 mL/min (by C-G formula based on SCr of 1 mg/dL). Liver Function Tests: Recent Labs  Lab 06/12/20 0926 06/14/20 1859 06/15/20 0508 06/16/20 0141 06/17/20 0223  AST 14 15 13* 11* 11*  ALT 16 14 13 11 10   ALKPHOS 81 75 76 72 68  BILITOT 0.5 0.2* 0.9 0.4 0.6  PROT 7.0 7.3 6.4* 6.1* 5.8*  ALBUMIN 4.2 4.1 3.7 3.5 3.3*   Recent Labs  Lab 06/12/20 0926 06/14/20 1859  LIPASE 15.0 66*   No results for input(s): AMMONIA in the last 168 hours. Coagulation profile No results for input(s): INR, PROTIME in the last 168 hours. COVID-19 Labs  No results for input(s): DDIMER, FERRITIN, LDH, CRP in the last 72 hours.  Lab Results  Component Value Date   SARSCOV2NAA NEGATIVE 06/15/2020   SARSCOV2NAA RESULT: NEGATIVE 01/22/2020    CBC: Recent Labs  Lab 06/12/20 0926 06/14/20 1859 06/15/20 0508 06/16/20 0141 06/17/20 0223   WBC 5.3 6.5 6.0 5.9 5.4  NEUTROABS 3.7 4.5  --   --   --   HGB 13.7 14.0 13.3 12.6* 12.1*  HCT 39.9 40.5 38.2* 37.5* 36.4*  MCV 89.3 88.6 88.4 91.0 90.1  PLT 175.0 199 170 175 171   Cardiac Enzymes: No results for input(s): CKTOTAL, CKMB, CKMBINDEX, TROPONINI in the last 168 hours. BNP (last 3 results) No results for input(s): PROBNP in the last 8760 hours. CBG: Recent Labs  Lab 06/16/20 1711 06/16/20 2002 06/16/20 2332 06/17/20 0405 06/17/20 0730  GLUCAP 141* 170* 103* 124* 119*   D-Dimer: No results for input(s): DDIMER in the last 72 hours. Hgb A1c: No results for input(s): HGBA1C in the last 72 hours. Lipid Profile: Recent Labs    06/15/20 0508  CHOL 148  HDL 34*  LDLCALC 103*  TRIG 55  CHOLHDL 4.4   Thyroid function studies: No results for input(s): TSH, T4TOTAL, T3FREE, THYROIDAB in the last 72 hours.  Invalid input(s): FREET3 Anemia work up: No results for input(s): VITAMINB12, FOLATE, FERRITIN, TIBC, IRON, RETICCTPCT in the last 72 hours. Sepsis Labs: Recent Labs  Lab 06/14/20 1859 06/15/20 0508 06/16/20 0141 06/17/20 0223  WBC 6.5 6.0 5.9 5.4  LATICACIDVEN 1.0  --   --   --    Microbiology Recent Results (from the past 240 hour(s))  SARS CORONAVIRUS 2 (TAT 6-24 HRS) Nasopharyngeal Nasopharyngeal Swab     Status: None   Collection Time: 06/15/20 12:09 AM   Specimen: Nasopharyngeal Swab  Result Value Ref Range Status   SARS Coronavirus 2 NEGATIVE NEGATIVE Final    Comment: (NOTE) SARS-CoV-2 target nucleic acids are NOT DETECTED.  The SARS-CoV-2 RNA is generally detectable in upper and lower respiratory specimens during the acute phase of infection. Negative results do not preclude SARS-CoV-2 infection, do not rule out co-infections with other pathogens, and should not be used as the sole basis for treatment or other patient management decisions. Negative results must be combined with clinical observations, patient history, and  epidemiological information. The expected result is Negative.  Fact Sheet for Patients: SugarRoll.be  Fact Sheet for Healthcare Providers: https://www.woods-mathews.com/  This test is not yet approved or cleared by the [place] FDA and  has been authorized for detection and/or diagnosis of SARS-CoV-2 by FDA under an Emergency Use Authorization (EUA). This EUA will remain  in effect (meaning this test can be used) for the duration of the COVID-19 declaration under Se ction 564(b)(1) of the Act, 21 U.S.C. section 360bbb-3(b)(1), unless the  authorization is terminated or revoked sooner.  Performed at [facility], [place] [address] [place], [place] [address]      Medications:   . amLODipine  10 mg Oral Daily  . cloNIDine  0.2 mg Oral QHS  . heparin  5,000 Units Subcutaneous Q8H  . insulin aspart  0-15 Units Subcutaneous Q4H  . insulin glargine  7 Units Subcutaneous Q2200  . levothyroxine  137 mcg Oral Q0600   Continuous Infusions: . lactated ringers 125 mL/hr at 06/17/20 0551      LOS: 2 days   [facility]  06/17/2020, 10:26 AM

## 2020-06-18 ENCOUNTER — Inpatient Hospital Stay (HOSPITAL_COMMUNITY): Payer: 59 | Admitting: Certified Registered Nurse Anesthetist

## 2020-06-18 ENCOUNTER — Encounter (HOSPITAL_COMMUNITY)
Admission: EM | Disposition: A | Discharge status: Home / Self Care | Payer: Self-pay | Source: Home / Self Care | Attending: Internal Medicine

## 2020-06-18 ENCOUNTER — Encounter (HOSPITAL_COMMUNITY): Payer: Self-pay | Admitting: Family Medicine

## 2020-06-18 DIAGNOSIS — K859 Acute pancreatitis without necrosis or infection, unspecified: Secondary | ICD-10-CM | POA: Diagnosis not present

## 2020-06-18 DIAGNOSIS — E040 Nontoxic diffuse goiter: Secondary | ICD-10-CM | POA: Diagnosis not present

## 2020-06-18 DIAGNOSIS — E119 Type 2 diabetes mellitus without complications: Secondary | ICD-10-CM | POA: Diagnosis not present

## 2020-06-18 DIAGNOSIS — I10 Essential (primary) hypertension: Secondary | ICD-10-CM | POA: Diagnosis not present

## 2020-06-18 HISTORY — PX: ESOPHAGOGASTRODUODENOSCOPY (EGD) WITH PROPOFOL: SHX5813

## 2020-06-18 HISTORY — PX: FINE NEEDLE ASPIRATION: SHX5430

## 2020-06-18 HISTORY — PX: UPPER ESOPHAGEAL ENDOSCOPIC ULTRASOUND (EUS): SHX6562

## 2020-06-18 HISTORY — PX: BIOPSY: SHX5522

## 2020-06-18 LAB — GLUCOSE, CAPILLARY
Glucose-Capillary: 108 mg/dL — ABNORMAL HIGH (ref 70–99)
Glucose-Capillary: 111 mg/dL — ABNORMAL HIGH (ref 70–99)
Glucose-Capillary: 132 mg/dL — ABNORMAL HIGH (ref 70–99)
Glucose-Capillary: 166 mg/dL — ABNORMAL HIGH (ref 70–99)
Glucose-Capillary: 199 mg/dL — ABNORMAL HIGH (ref 70–99)
Glucose-Capillary: 96 mg/dL (ref 70–99)

## 2020-06-18 SURGERY — ESOPHAGOGASTRODUODENOSCOPY (EGD) WITH PROPOFOL
Anesthesia: Monitor Anesthesia Care

## 2020-06-18 MED ORDER — PROPOFOL 500 MG/50ML IV EMUL
INTRAVENOUS | Status: DC | PRN
  Administered 2020-06-18: 150 ug/kg/min via INTRAVENOUS

## 2020-06-18 MED ORDER — CIPROFLOXACIN IN D5W 400 MG/200ML IV SOLN
INTRAVENOUS | Status: AC
  Filled 2020-06-18: qty 200

## 2020-06-18 MED ORDER — LIDOCAINE 2% (20 MG/ML) 5 ML SYRINGE
INTRAMUSCULAR | Status: DC | PRN
  Administered 2020-06-18 (×2): 40 mg via INTRAVENOUS

## 2020-06-18 MED ORDER — ZOLPIDEM TARTRATE 5 MG PO TABS
5.0000 mg | ORAL_TABLET | Freq: Every evening | ORAL | Status: DC | PRN
  Filled 2020-06-18: qty 1

## 2020-06-18 MED ORDER — SODIUM CHLORIDE 0.9 % IV SOLN: 3.0000 g | Freq: Once | INTRAVENOUS | Status: DC

## 2020-06-18 MED ORDER — CIPROFLOXACIN IN D5W 400 MG/200ML IV SOLN
INTRAVENOUS | Status: DC | PRN
  Administered 2020-06-18: 400 mg via INTRAVENOUS

## 2020-06-18 MED ORDER — PROPOFOL 10 MG/ML IV BOLUS
INTRAVENOUS | Status: DC | PRN
  Administered 2020-06-18 (×2): 10 mg via INTRAVENOUS

## 2020-06-18 MED ORDER — LACTATED RINGERS IV SOLN: INTRAVENOUS | Status: DC | PRN

## 2020-06-18 MED ORDER — INDOMETHACIN 50 MG RE SUPP: 100.0000 mg | Freq: Once | RECTAL | Status: DC

## 2020-06-18 SURGICAL SUPPLY — 15 items

## 2020-06-18 NOTE — Plan of Care (Signed)
  Problem: Education: Goal: Knowledge of General Education information will improve Description: Including pain rating scale, medication(s)/side effects and non-pharmacologic comfort measures Outcome: Progressing   Problem: Clinical Measurements: Goal: Diagnostic test results will improve Outcome: Progressing   Problem: Coping: Goal: Level of anxiety will decrease Outcome: Progressing   Problem: Pain Managment: Goal: General experience of comfort will improve Outcome: Progressing   

## 2020-06-18 NOTE — Op Note (Signed)
[facility] Patient Name: Timothy Page Procedure Date : 06/18/2020 MRN: [id number] Attending MD: Justice Britain , MD Date of Birth: 29-Feb-1955 CSN: [id number] Age: 65 Admit Type: Inpatient Procedure:                Upper EUS Indications:              Dilated pancreatic duct on CT scan, Suspected mass                            in pancreas on MRCP, Suspected pancreatic neoplasm,                            Acute pancreatitis Providers:                Justice Britain, MD, Kary Kos RN, RN, Cletis Athens, Technician Referring MD:             Gerrit Heck, MD, Gatha Mayer, MD, [facility] Medicines:                Monitored Anesthesia Care, Cipro 315 mg IV Complications:            No immediate complications. Estimated Blood Loss:     Estimated blood loss was minimal. Procedure:                Pre-Anesthesia Assessment:                           - Prior to the procedure, a History and Physical                            was performed, and patient medications and                            allergies were reviewed. The patient's tolerance of                            previous anesthesia was also reviewed. The risks                            and benefits of the procedure and the sedation                            options and risks were discussed with the patient.                            All questions were answered, and informed consent                            was obtained. Prior Anticoagulants: The patient  last took heparin 1 day prior to the procedure and                            has taken no previous anticoagulant or antiplatelet                            agents except for aspirin. ASA Grade Assessment:                            III - A patient with severe systemic disease. After                            reviewing the risks and benefits, the patient was                             deemed in satisfactory condition to undergo the                            procedure.                           After obtaining informed consent, the endoscope was                            passed under direct vision. Throughout the                            procedure, the patient's blood pressure, pulse, and                            oxygen saturations were monitored continuously. The                            [id number]-H190 ([id number]) Olympus gastroscope was                            introduced through the mouth, and advanced to the                            second part of duodenum. The TJF- Q180V ([id number])                            Olympus duodenoscope was introduced through the                            mouth, and advanced to the second part of duodenum.                            The [id number] ([id number]) Olympus Linear EUS was                            introduced through the mouth, and advanced to the  duodenum for ultrasound examination from the                            stomach and duodenum. The upper EUS was                            accomplished without difficulty. The patient                            tolerated the procedure. Scope In: Scope Out: Findings:      ENDOSCOPIC FINDING: :      White nummular lesions were noted in the entire esophagus. Biopsies were       taken with a cold forceps for histology to rule out Candida.      The Z-line was regular and was found 40 cm from the incisors.      No gross lesions were noted in the entire examined stomach. Biopsies       were taken with a cold forceps for histology and Helicobacter pylori       testing.      No gross lesions were noted in the duodenal bulb, in the first portion       of the duodenum and in the second portion of the duodenum.      The ampulla was normal.      ENDOSONOGRAPHIC FINDING: :      An irregular mass-like region was identified in the head/genu of the        pancreas. The lesion was hypoechoic. The mass measured 34 mm by 32 mm in       maximal cross-sectional diameter. The outer margins were irregular.       There was sonographic evidence suggesting invasion into the superior       mesenteric artery (manifested by abutment) and the portal vein       (manifested by abutment). An intact interface was seen between the mass       and the celiac trunk suggesting a lack of invasion. The remainder of the       pancreas was examined. The endosonographic appearance of parenchyma and       the upstream pancreatic duct indicated duct dilation (PDH - 8.1 mm, PDN       - 7.5 mm, PDB - 8.3 mm, PDT - 5.9 mm), an irregularly contoured duct,       prominent ductal side-branches, visible ductal side-branches and no       ductal or parenchymal calcifications. Fine needle biopsy was performed.       Color Doppler imaging was utilized prior to needle puncture to confirm a       lack of significant vascular structures within the needle path. Six       passes were made with the Acquire 22 gauge ultrasound core biopsy needle       using a transduodenal approach. Visible cores of tissue were obtained.       Preliminary cytologic examination and touch preps were performed. Final       cytology results are pending.      There was no sign of significant endosonographic abnormality in the       common bile duct (6.0 mm). No stones, no biliary sludge and ducts with       regular contour were identified.      Endosonographic  imaging in the visualized portion of the liver showed no       mass.      No malignant-appearing lymph nodes were visualized in the celiac region       (level 20), peripancreatic region and porta hepatis region.      The celiac region was visualized. Impression:               EGD Impression:                           - White nummular lesions in esophageal mucosa.                            Biopsied.                           - Z-line regular, 40 cm  from the incisors.                           - No gross lesions in the stomach. Biopsied.                           - No gross lesions in the duodenal bulb, in the                            first portion of the duodenum and in the second                            portion of the duodenum.                           - Normal ampulla.                           EUS Impression:                           - A mass was identified in the pancreatic                            head/genu. Cytology results are pending. However,                            the endosonographic appearance is suspicious for                            adenocarcinoma. Fine needle biopsy performed.                            Because of imaging findings suggestive of a lesion                            in the liver as well as pulmonary nodules, there is                            concern that a metastatic process could  be occuring                            and this was staged T4 N0 M(x-1-2). The staging                            applies if malignancy is confirmed and the other                            areas on imaging studies were to turn out malignant.                           - There was no sign of significant pathology in the                            common bile duct.                           - No malignant-appearing lymph nodes were                            visualized in the celiac region (level 20),                            peripancreatic region and porta hepatis region. Recommendation:           - The patient will be observed post-procedure,                            until all discharge criteria are met.                           - Return patient to hospital ward for ongoing care.                           - Observe patient's clinical course.                           - Await cytology results and await path results.                           - Continue Pancreatitis supportive managment.                            - The findings and recommendations were discussed                            with the patient.                           - The findings and recommendations were discussed                            with the patient's family. Procedure Code(s):        --- Professional ---  43238, Esophagogastroduodenoscopy, flexible,                            transoral; with transendoscopic ultrasound-guided                            intramural or transmural fine needle                            aspiration/biopsy(s), (includes endoscopic                            ultrasound examination limited to the esophagus,                            stomach or duodenum, and adjacent structures) Diagnosis Code(s):        --- Professional ---                           K22.8, Other specified diseases of esophagus                           K86.89, Other specified diseases of pancreas                           I89.9, Noninfective disorder of lymphatic vessels                            and lymph nodes, unspecified                           K85.90, Acute pancreatitis without necrosis or                            infection, unspecified                           R93.3, Abnormal findings on diagnostic imaging of                            other parts of digestive tract CPT copyright 2019 American [employer]. All rights reserved. The codes documented in this report are preliminary and upon coder review may  be revised to meet current compliance requirements. Justice Britain, MD 06/18/2020 11:56:53 AM Number of Addenda: 0

## 2020-06-18 NOTE — Anesthesia Preprocedure Evaluation (Signed)
Anesthesia Evaluation  Patient identified by MRN, date of birth, ID band Patient awake    Reviewed: Allergy & Precautions, NPO status , Patient's Chart, lab work & pertinent test results  Airway Mallampati: II     Mouth opening: Limited Mouth Opening  Dental   Pulmonary pneumonia,    breath sounds clear to auscultation       Cardiovascular hypertension, + CAD and + Past MI   Rhythm:Regular Rate:Normal     Neuro/Psych PSYCHIATRIC DISORDERS Anxiety CVA    GI/Hepatic Neg liver ROS,   Endo/Other  diabetesHypothyroidism   Renal/GU      Musculoskeletal  (+) Arthritis ,   Abdominal   Peds  Hematology   Anesthesia Other Findings   Reproductive/Obstetrics                             Anesthesia Physical Anesthesia Plan  ASA: III  Anesthesia Plan: MAC   Post-op Pain Management:    Induction: Intravenous  PONV Risk Score and Plan: 2 and Propofol infusion  Airway Management Planned: Nasal Cannula and Simple Face Mask  Additional Equipment:   Intra-op Plan:   Post-operative Plan:   Informed Consent: I have reviewed the patients History and Physical, chart, labs and discussed the procedure including the risks, benefits and alternatives for the proposed anesthesia with the patient or authorized representative who has indicated his/her understanding and acceptance.     Dental advisory given  Plan Discussed with: CRNA and Anesthesiologist  Anesthesia Plan Comments:         Anesthesia Quick Evaluation

## 2020-06-18 NOTE — Progress Notes (Signed)
[facility] PROGRESS NOTE    Progress Note  Timothy Page  FAO:[id number] DOB: January 28, 1956 DOA: 06/14/2020 PCP: Tammi Sou, MD     Brief Narrative:   Timothy Page is an 65 y.o. male past medical history of type 2 diabetes mellitus, essential hypertension comes in for abdominal pain was diagnosed with acute pancreatitis.  CT scan confirms it showed normal-appearing fat adjacent stranding.   Assessment/Plan:   Acute pancreatitis: Treated conservatively with IV fluids and IV narcotics. MRCP showed a 3.2 x 2.4 cm pancreatic head lesion. enhancing lesion of the right lobe of the liver abutting the gallbladder fossa, hepatic segment V, measuring 1.2 x 1.1 cm. GI is planning for EUS and biopsy on this admission., CA 19-9 is 28 CT scan of the chest multiple small pulmonary nodules Further management per GI.  Hypothyroidism: Continue Synthroid.  Essential hypertension: ACE inhibitor was held on admission continue amlodipine and clonidine.  Controlled type 2 diabetes mellitus without complication, without long-term current use of insulin (HCC)with hyperglycemia: Continue long-acting insulin per sliding scale for glucose was fairly well controlled  History of CVA: Continue aspirin.    DVT prophylaxis: lovenox Family Communication:none Status is: Inpatient  Remains inpatient appropriate because:Hemodynamically unstable   Dispo: The patient is from: Home              Anticipated d/c is to: Home              Patient currently is not medically stable to d/c.   Difficult to place patient No        Code Status:     Code Status Orders  (From admission, onward)         Start     Ordered   06/15/20 0318  Full code  Continuous        06/15/20 0318        Code Status History    Date Active Date Inactive Code Status Order ID Comments User Context   05/11/2017 0346 05/13/2017 1500 Full Code [id number]  Vianne Bulls, MD ED   05/07/2017 1456 05/09/2017 1330  Full Code [id number]  Velna Hatchet, MD Inpatient   07/31/2016 1646 08/05/2016 1944 Full Code [id number]  Elease Hashimoto ED   Advance Care Planning Activity        IV Access:    Peripheral IV   Procedures and diagnostic studies:   CT CHEST WO CONTRAST  Result Date: 06/18/2020 CLINICAL DATA:  65 year old male with history of pancreatic cancer. Staging examination. EXAM: CT CHEST WITHOUT CONTRAST TECHNIQUE: Multidetector CT imaging of the chest was performed following the standard protocol without IV contrast. COMPARISON:  Chest CT 09/11/2019. FINDINGS: Cardiovascular: Heart size is normal. There is no significant pericardial fluid, thickening or pericardial calcification. There is aortic atherosclerosis, as well as atherosclerosis of the great vessels of the mediastinum and the coronary arteries, including calcified atherosclerotic plaque in the left main, left anterior descending and right coronary arteries. Mediastinum/Nodes: No pathologically enlarged mediastinal or hilar lymph nodes. Please note that accurate exclusion of hilar adenopathy is limited on noncontrast CT scans. Esophagus is unremarkable in appearance. No axillary lymphadenopathy. Lungs/Pleura: Multiple new pulmonary nodules are noted in the lungs bilaterally, largest of which are in the left lower lobe (axial image 90 of series 4) measuring up to 6 mm. No acute consolidative airspace disease. No pleural effusions. Upper Abdomen: Soft tissue stranding around the pancreas concerning for acute pancreatitis. Previously demonstrated pancreatic mass is not well appreciated  on today's noncontrast CT examination (please see recent abdominal MRI 06/15/2020 for full description of these findings). High attenuation material in the gallbladder likely represents vicarious excretion of gadolinium from recent contrast enhanced abdominal MRI. Aortic atherosclerosis. Musculoskeletal: Flowing anterior osteophytes throughout the thoracic spine,  suggesting diffuse idiopathic skeletal hyperostosis (DISH). There are no aggressive appearing lytic or blastic lesions noted in the visualized portions of the skeleton. IMPRESSION: 1. Multiple new pulmonary nodules scattered throughout the lungs bilaterally measuring up to 6 mm in the left lower lobe. These are nonspecific, but the possibility of metastatic disease is not excluded. Close attention on future follow-up studies is recommended to ensure the stability or regression of these findings. 2. Aortic atherosclerosis, in addition to left main and 2 vessel coronary artery disease. Please note that although the presence of coronary artery calcium documents the presence of coronary artery disease, the severity of this disease and any potential stenosis cannot be assessed on this non-gated CT examination. Assessment for potential risk factor modification, dietary therapy or pharmacologic therapy may be warranted, if clinically indicated. Aortic Atherosclerosis (ICD10-I70.0). Electronically Signed   By: Vinnie Langton M.D.   On: 06/18/2020 08:44     Medical Consultants:    None.   Subjective:    Timothy Page quiet and sad this morning after being told the results of the CT of the chest..  Objective:    Vitals:   06/17/20 0901 06/17/20 1748 06/17/20 1949 06/18/20 0443  BP: (!) 143/83 128/73 (!) 146/78 (!) 107/58  Pulse: 71 73 75 (!) 51  Resp: 16 16 16 16   Temp: 98.4 F (36.9 C) 98.2 F (36.8 C) 98.3 F (36.8 C) 97.9 F (36.6 C)  TempSrc: Oral Oral Oral Oral  SpO2: 96% 99% 96% 98%  Weight:      Height:       SpO2: 98 %   Intake/Output Summary (Last 24 hours) at 06/18/2020 0854 Last data filed at 06/18/2020 0724 Gross per 24 hour  Intake 3492.42 ml  Output 1600 ml  Net 1892.42 ml   Filed Weights   06/14/20 1818 06/15/20 0257  Weight: 87.5 kg 84.7 kg    Exam: General exam: In no acute distress. Respiratory system: Good air movement and clear to  auscultation. Cardiovascular system: S1 & S2 heard, RRR. No JVD. Gastrointestinal system: Abdomen is nondistended, soft and nontender.  Extremities: No pedal edema. Skin: No rashes, lesions or ulcers Psychiatry: Judgement and insight appear normal. Mood & affect appropriate.   Data Reviewed:    Labs: Basic Metabolic Panel: Recent Labs  Lab 06/12/20 0926 06/14/20 1859 06/15/20 0508 06/16/20 0141 06/17/20 0223  NA 138 138 139 139 139  K 4.1 3.9 3.3* 3.9 3.9  CL 105 104 104 105 105  CO2 26 23 27 26 26   GLUCOSE 155* 114* 122* 96 121*  BUN 17 17 11 9 8   CREATININE 1.08 1.12 1.04 0.99 1.00  CALCIUM 9.6 9.7 9.5 9.3 9.2  MG  --   --  1.9  --   --    GFR Estimated Creatinine Clearance: 75.6 mL/min (by C-G formula based on SCr of 1 mg/dL). Liver Function Tests: Recent Labs  Lab 06/12/20 0926 06/14/20 1859 06/15/20 0508 06/16/20 0141 06/17/20 0223  AST 14 15 13* 11* 11*  ALT 16 14 13 11 10   ALKPHOS 81 75 76 72 68  BILITOT 0.5 0.2* 0.9 0.4 0.6  PROT 7.0 7.3 6.4* 6.1* 5.8*  ALBUMIN 4.2 4.1 3.7 3.5 3.3*  Recent Labs  Lab 06/12/20 0926 06/14/20 1859  LIPASE 15.0 66*   No results for input(s): AMMONIA in the last 168 hours. Coagulation profile No results for input(s): INR, PROTIME in the last 168 hours. COVID-19 Labs  No results for input(s): DDIMER, FERRITIN, LDH, CRP in the last 72 hours.  Lab Results  Component Value Date   SARSCOV2NAA NEGATIVE 06/15/2020   SARSCOV2NAA RESULT: NEGATIVE 01/22/2020    CBC: Recent Labs  Lab 06/12/20 0926 06/14/20 1859 06/15/20 0508 06/16/20 0141 06/17/20 0223  WBC 5.3 6.5 6.0 5.9 5.4  NEUTROABS 3.7 4.5  --   --   --   HGB 13.7 14.0 13.3 12.6* 12.1*  HCT 39.9 40.5 38.2* 37.5* 36.4*  MCV 89.3 88.6 88.4 91.0 90.1  PLT 175.0 199 170 175 171   Cardiac Enzymes: No results for input(s): CKTOTAL, CKMB, CKMBINDEX, TROPONINI in the last 168 hours. BNP (last 3 results) No results for input(s): PROBNP in the last 8760  hours. CBG: Recent Labs  Lab 06/17/20 1132 06/17/20 1658 06/17/20 1949 06/18/20 0005 06/18/20 0407  GLUCAP 137* 128* 193* 96 108*   D-Dimer: No results for input(s): DDIMER in the last 72 hours. Hgb A1c: No results for input(s): HGBA1C in the last 72 hours. Lipid Profile: No results for input(s): CHOL, HDL, LDLCALC, TRIG, CHOLHDL, LDLDIRECT in the last 72 hours. Thyroid function studies: No results for input(s): TSH, T4TOTAL, T3FREE, THYROIDAB in the last 72 hours.  Invalid input(s): FREET3 Anemia work up: No results for input(s): VITAMINB12, FOLATE, FERRITIN, TIBC, IRON, RETICCTPCT in the last 72 hours. Sepsis Labs: Recent Labs  Lab 06/14/20 1859 06/15/20 0508 06/16/20 0141 06/17/20 0223  WBC 6.5 6.0 5.9 5.4  LATICACIDVEN 1.0  --   --   --    Microbiology Recent Results (from the past 240 hour(s))  SARS CORONAVIRUS 2 (TAT 6-24 HRS) Nasopharyngeal Nasopharyngeal Swab     Status: None   Collection Time: 06/15/20 12:09 AM   Specimen: Nasopharyngeal Swab  Result Value Ref Range Status   SARS Coronavirus 2 NEGATIVE NEGATIVE Final    Comment: (NOTE) SARS-CoV-2 target nucleic acids are NOT DETECTED.  The SARS-CoV-2 RNA is generally detectable in upper and lower respiratory specimens during the acute phase of infection. Negative results do not preclude SARS-CoV-2 infection, do not rule out co-infections with other pathogens, and should not be used as the sole basis for treatment or other patient management decisions. Negative results must be combined with clinical observations, patient history, and epidemiological information. The expected result is Negative.  Fact Sheet for Patients: SugarRoll.be  Fact Sheet for Healthcare Providers: https://www.woods-mathews.com/  This test is not yet approved or cleared by the [place] FDA and  has been authorized for detection and/or diagnosis of SARS-CoV-2 by FDA under an Emergency  Use Authorization (EUA). This EUA will remain  in effect (meaning this test can be used) for the duration of the COVID-19 declaration under Se ction 564(b)(1) of the Act, 21 U.S.C. section 360bbb-3(b)(1), unless the authorization is terminated or revoked sooner.  Performed at [facility], [place] [address], [place], [place] [address]      Medications:   . amLODipine  10 mg Oral Daily  . cloNIDine  0.2 mg Oral QHS  . insulin aspart  0-15 Units Subcutaneous Q4H  . insulin glargine  7 Units Subcutaneous Q2200  . levothyroxine  137 mcg Oral Q0600   Continuous Infusions: . lactated ringers 75 mL/hr at 06/18/20 [phone]  LOS: 3 days   [facility]  06/18/2020, 8:54 AM

## 2020-06-18 NOTE — Transfer of Care (Signed)
Immediate Anesthesia Transfer of Care Note  Patient: ROHIT DELORIA  Procedure(s) Performed: ESOPHAGOGASTRODUODENOSCOPY (EGD) WITH PROPOFOL (N/A ) UPPER ESOPHAGEAL ENDOSCOPIC ULTRASOUND (EUS) (N/A ) BIOPSY FINE NEEDLE ASPIRATION (FNA) LINEAR  Patient Location: PACU  Anesthesia Type:MAC  Level of Consciousness: drowsy and patient cooperative  Airway & Oxygen Therapy: Patient Spontanous Breathing and Patient connected to nasal cannula oxygen  Post-op Assessment: Report given to RN and Post -op Vital signs reviewed and stable  Post vital signs: Reviewed  Last Vitals:  Vitals Value Taken Time  BP 128/98 06/18/20 1126  Temp 36.4 C 06/18/20 1126  Pulse 63 06/18/20 1128  Resp 17 06/18/20 1128  SpO2 100 % 06/18/20 1128  Vitals shown include unvalidated device data.  Last Pain:  Vitals:   06/18/20 0938  TempSrc: Oral  PainSc: 7       Patients Stated Pain Goal: 0 (40/81/44 8185)  Complications: No complications documented.

## 2020-06-18 NOTE — Anesthesia Postprocedure Evaluation (Signed)
Anesthesia Post Note  Patient: Timothy Page  Procedure(s) Performed: ESOPHAGOGASTRODUODENOSCOPY (EGD) WITH PROPOFOL (N/A ) UPPER ESOPHAGEAL ENDOSCOPIC ULTRASOUND (EUS) (N/A ) BIOPSY FINE NEEDLE ASPIRATION (FNA) LINEAR     Patient location during evaluation: Endoscopy Anesthesia Type: MAC Level of consciousness: awake Pain management: pain level controlled Vital Signs Assessment: post-procedure vital signs reviewed and stable Respiratory status: spontaneous breathing Cardiovascular status: stable Postop Assessment: no apparent nausea or vomiting Anesthetic complications: no   No complications documented.  Last Vitals:  Vitals:   06/18/20 1207 06/18/20 1227  BP: (!) 178/91 (!) 161/97  Pulse: 73 61  Resp: 12 18  Temp: 36.7 C 36.4 C  SpO2: 100% 98%    Last Pain:  Vitals:   06/18/20 1227  TempSrc: Oral  PainSc: 9                  [place]au

## 2020-06-18 NOTE — Anesthesia Procedure Notes (Signed)
Procedure Name: MAC Date/Time: 06/18/2020 10:24 AM Performed by: Janene Harvey, CRNA Pre-anesthesia Checklist: Patient identified, Emergency Drugs available, Suction available and Patient being monitored Patient Re-evaluated:Patient Re-evaluated prior to induction Oxygen Delivery Method: Nasal cannula Placement Confirmation: positive ETCO2 Dental Injury: Teeth and Oropharynx as per pre-operative assessment

## 2020-06-18 NOTE — Plan of Care (Signed)
  Problem: Education: Goal: Knowledge of General Education information will improve Description: Including pain rating scale, medication(s)/side effects and non-pharmacologic comfort measures Outcome: Completed/Met   Problem: Health Behavior/Discharge Planning: Goal: Ability to manage health-related needs will improve Outcome: Completed/Met   Problem: Clinical Measurements: Goal: Diagnostic test results will improve Outcome: Completed/Met Goal: Respiratory complications will improve Outcome: Completed/Met Goal: Cardiovascular complication will be avoided Outcome: Completed/Met   Problem: Activity: Goal: Risk for activity intolerance will decrease Outcome: Completed/Met   Problem: Coping: Goal: Level of anxiety will decrease Outcome: Completed/Met   Problem: Elimination: Goal: Will not experience complications related to urinary retention Outcome: Completed/Met   Problem: Safety: Goal: Ability to remain free from injury will improve Outcome: Completed/Met   Problem: Skin Integrity: Goal: Risk for impaired skin integrity will decrease Outcome: Completed/Met

## 2020-06-18 NOTE — Interval H&P Note (Signed)
History and Physical Interval Note:  06/18/2020 8:23 AM  Timothy Page  has presented today for surgery, with the diagnosis of Acute pancreatitis.  Question pancreatic neoplasm..  The various methods of treatment have been discussed with the patient and family. After consideration of risks, benefits and other options for treatment, the patient has consented to  Procedure(s): ESOPHAGOGASTRODUODENOSCOPY (EGD) WITH PROPOFOL (N/A) UPPER ESOPHAGEAL ENDOSCOPIC ULTRASOUND (EUS) (N/A) as a surgical intervention.  The patient's history has been reviewed, patient examined, no change in status, stable for surgery.  I have reviewed the patient's chart and labs.  Questions were answered to the patient's satisfaction.    The risks of an EUS including intestinal perforation, bleeding, infection, aspiration, and medication effects were discussed as was the possibility it may not give a definitive diagnosis if a biopsy is performed.  When a biopsy of the pancreas is done as part of the EUS, there is an additional risk of pancreatitis at the rate of about 1-2%.  It was explained that procedure related pancreatitis is typically mild, although it can be severe and even life threatening, which is why we do not perform random pancreatic biopsies and only biopsy a lesion/area we feel is concerning enough to warrant the risk.  The risks and benefits of endoscopic evaluation were discussed with the patient; these include but are not limited to the risk of perforation, infection, bleeding, missed lesions, lack of diagnosis, severe illness requiring hospitalization, as well as anesthesia and sedation related illnesses.  The patient is agreeable to proceed.    [employer]

## 2020-06-19 ENCOUNTER — Other Ambulatory Visit: Payer: Self-pay | Admitting: Family Medicine

## 2020-06-19 ENCOUNTER — Encounter (HOSPITAL_COMMUNITY): Payer: Self-pay | Admitting: Gastroenterology

## 2020-06-19 DIAGNOSIS — K859 Acute pancreatitis without necrosis or infection, unspecified: Secondary | ICD-10-CM | POA: Diagnosis not present

## 2020-06-19 DIAGNOSIS — R634 Abnormal weight loss: Secondary | ICD-10-CM

## 2020-06-19 DIAGNOSIS — E440 Moderate protein-calorie malnutrition: Secondary | ICD-10-CM

## 2020-06-19 DIAGNOSIS — R1084 Generalized abdominal pain: Secondary | ICD-10-CM

## 2020-06-19 DIAGNOSIS — E119 Type 2 diabetes mellitus without complications: Secondary | ICD-10-CM

## 2020-06-19 DIAGNOSIS — I10 Essential (primary) hypertension: Secondary | ICD-10-CM | POA: Diagnosis not present

## 2020-06-19 LAB — GLUCOSE, CAPILLARY
Glucose-Capillary: 136 mg/dL — ABNORMAL HIGH (ref 70–99)
Glucose-Capillary: 151 mg/dL — ABNORMAL HIGH (ref 70–99)
Glucose-Capillary: 212 mg/dL — ABNORMAL HIGH (ref 70–99)
Glucose-Capillary: 218 mg/dL — ABNORMAL HIGH (ref 70–99)
Glucose-Capillary: 78 mg/dL (ref 70–99)
Glucose-Capillary: 85 mg/dL (ref 70–99)
Glucose-Capillary: 93 mg/dL (ref 70–99)

## 2020-06-19 LAB — SURGICAL PATHOLOGY

## 2020-06-19 MED ORDER — OXYCODONE HCL 5 MG PO TABS
5.0000 mg | ORAL_TABLET | ORAL | Status: DC | PRN
  Administered 2020-06-19 – 2020-06-22 (×7): 10 mg via ORAL
  Filled 2020-06-19 (×8): qty 2

## 2020-06-19 NOTE — Progress Notes (Signed)
Daily Rounding Note  06/19/2020, 1:02 PM  LOS: 4 days   SUBJECTIVE:   Chief complaint: Pancreatic mass.  Acute pancreatitis    Still experiencing abdominal pain.  Available pain meds do not seem to be working as well.  No nausea.  Tolerating clear liquids but they do trigger some pain.  OBJECTIVE:         Vital signs in last 24 hours:    Temp:  [98.1 F (36.7 C)-98.8 F (37.1 C)] 98.1 F (36.7 C) (05/06 1012) Pulse Rate:  [59-79] 69 (05/06 1012) Resp:  [16-18] 16 (05/06 1012) BP: (132-145)/(84-92) 137/84 (05/06 1012) SpO2:  [97 %-100 %] 99 % (05/06 1012) Last BM Date: 06/13/20 Filed Weights   06/14/20 1818 06/15/20 0257  Weight: 87.5 kg 84.7 kg   General: Patient sleeping so I did not wake him up and I did not examine him just visually observed him resting quietly. I spoke with his wife who was sitting in the room and got updates on how the patient is doing   Intake/Output from previous day: 05/05 0701 - 05/06 0700 In: 2005 [P.O.:1100; I.V.:705; IV Piggyback:200] Out: 700 [Urine:700]  Intake/Output this shift: No intake/output data recorded.  Lab Results: Recent Labs    06/17/20 0223  WBC 5.4  HGB 12.1*  HCT 36.4*  PLT 171   BMET Recent Labs    06/17/20 0223  NA 139  K 3.9  CL 105  CO2 26  GLUCOSE 121*  BUN 8  CREATININE 1.00  CALCIUM 9.2   LFT Recent Labs    06/17/20 0223  PROT 5.8*  ALBUMIN 3.3*  AST 11*  ALT 10  ALKPHOS 68  BILITOT 0.6   PT/INR No results for input(s): LABPROT, INR in the last 72 hours. Hepatitis Panel No results for input(s): HEPBSAG, HCVAB, HEPAIGM, HEPBIGM in the last 72 hours.  Studies/Results: CT CHEST WO CONTRAST  Result Date: 06/18/2020 CLINICAL DATA:  65 year old male with history of pancreatic cancer. Staging examination. EXAM: CT CHEST WITHOUT CONTRAST TECHNIQUE: Multidetector CT imaging of the chest was performed following the standard protocol  without IV contrast. COMPARISON:  Chest CT 09/11/2019. FINDINGS: Cardiovascular: Heart size is normal. There is no significant pericardial fluid, thickening or pericardial calcification. There is aortic atherosclerosis, as well as atherosclerosis of the great vessels of the mediastinum and the coronary arteries, including calcified atherosclerotic plaque in the left main, left anterior descending and right coronary arteries. Mediastinum/Nodes: No pathologically enlarged mediastinal or hilar lymph nodes. Please note that accurate exclusion of hilar adenopathy is limited on noncontrast CT scans. Esophagus is unremarkable in appearance. No axillary lymphadenopathy. Lungs/Pleura: Multiple new pulmonary nodules are noted in the lungs bilaterally, largest of which are in the left lower lobe (axial image 90 of series 4) measuring up to 6 mm. No acute consolidative airspace disease. No pleural effusions. Upper Abdomen: Soft tissue stranding around the pancreas concerning for acute pancreatitis. Previously demonstrated pancreatic mass is not well appreciated on today's noncontrast CT examination (please see recent abdominal MRI 06/15/2020 for full description of these findings). High attenuation material in the gallbladder likely represents vicarious excretion of gadolinium from recent contrast enhanced abdominal MRI. Aortic atherosclerosis. Musculoskeletal: Flowing anterior osteophytes throughout the thoracic spine, suggesting diffuse idiopathic skeletal hyperostosis (DISH). There are no aggressive appearing lytic or blastic lesions noted in the visualized portions of the skeleton. IMPRESSION: 1. Multiple new pulmonary nodules scattered throughout the lungs bilaterally measuring up to 6 mm  in the left lower lobe. These are nonspecific, but the possibility of metastatic disease is not excluded. Close attention on future follow-up studies is recommended to ensure the stability or regression of these findings. 2. Aortic  atherosclerosis, in addition to left main and 2 vessel coronary artery disease. Please note that although the presence of coronary artery calcium documents the presence of coronary artery disease, the severity of this disease and any potential stenosis cannot be assessed on this non-gated CT examination. Assessment for potential risk factor modification, dietary therapy or pharmacologic therapy may be warranted, if clinically indicated. Aortic Atherosclerosis (ICD10-I70.0). Electronically Signed   By: Vinnie Langton M.D.   On: 06/18/2020 08:44    ASSESMENT:   *  Acute pancreatitis. Mass at the pancreatic head/neck. CA 19-9 28, WNL.   10/19/2020 upper EUS: Mass at head/genu of pancreas.  Sonographic signal suspicious for adenocarcinoma.  Awaiting results of FNA (cyto still pndg).  Sonography suggested invasion into SMV and portal veinLesions suggestive of mets in the liver and lung which  suggest T4 N0 M (x-1-2) lesion.   Incidental finding of nummular lesions throughout esophagus, biopsied for Candida.  Normal-appearing gastric mucosa was also biopsied.  Pathology shows mild reactive gastropathy in the stomach, mild reactive changes in esophagus but no fungal organisms.   PLAN   *   Await cytology.    *   Going to try advancing to full liquids.  They may cause some pain but so to the clear liquids and if he can tolerate a more substantial diet it is in his best interest.  *    attending physician may want to consider adding oral narcotics as it may give a better baseline management of the pain   Timothy Page  06/19/2020, 1:02 PM Phone [phone]

## 2020-06-19 NOTE — Progress Notes (Signed)
[facility] PROGRESS NOTE    Progress Note  Timothy Page  HWE:[id number] DOB: [birth year] DOA: 06/14/2020 PCP: Tammi Sou, MD     Brief Narrative:   Timothy Page is an 65 y.o. male past medical history of type 2 diabetes mellitus, essential hypertension comes in for abdominal pain was diagnosed with acute pancreatitis.  CT scan confirms it showed normal-appearing fat adjacent stranding.  Significant studies: MRCP showed a 3.2 x 2.4 cm pancreatic head lesion. enhancing lesion of the right lobe of the liver abutting the gallbladder fossa, hepatic segment V, measuring 1.2 x 1.1 cm. Assessment/Plan:   Acute pancreatitis: Treated conservatively with IV fluids and IV narcotics. Status post EUS on 06/18/2020 showed a mass identified in the pancreatic head ultrasonographic appearance is suspicious for adenocarcinoma brushing and fine-needle biopsy performed. Because of lesions in the liver and chest I am concerned about metastatic disease. Biopsy results are pending.  Hypothyroidism: Continue Synthroid.  Essential hypertension: ACE inhibitor was held on admission continue amlodipine and clonidine.  Controlled type 2 diabetes mellitus without complication, without long-term current use of insulin (HCC)with hyperglycemia: Continue long-acting insulin per sliding scale for glucose was fairly well controlled  History of CVA: Continue aspirin.    DVT prophylaxis: lovenox Family Communication:none Status is: Inpatient  Remains inpatient appropriate because:Hemodynamically unstable   Dispo: The patient is from: Home              Anticipated d/c is to: Home              Patient currently is not medically stable to d/c.   Difficult to place patient No        Code Status:     Code Status Orders  (From admission, onward)         Start     Ordered   06/15/20 0318  Full code  Continuous        06/15/20 0318        Code Status History    Date Active Date  Inactive Code Status Order ID Comments User Context   05/11/2017 0346 05/13/2017 1500 Full Code [id number]  Vianne Bulls, MD ED   05/07/2017 1456 05/09/2017 1330 Full Code [id number]  Velna Hatchet, MD Inpatient   07/31/2016 1646 08/05/2016 1944 Full Code [id number]  [facility] ED   Advance Care Planning Activity        IV Access:    Peripheral IV   Procedures and diagnostic studies:   CT CHEST WO CONTRAST  Result Date: 06/18/2020 CLINICAL DATA:  65 year old male with history of pancreatic cancer. Staging examination. EXAM: CT CHEST WITHOUT CONTRAST TECHNIQUE: Multidetector CT imaging of the chest was performed following the standard protocol without IV contrast. COMPARISON:  Chest CT 09/11/2019. FINDINGS: Cardiovascular: Heart size is normal. There is no significant pericardial fluid, thickening or pericardial calcification. There is aortic atherosclerosis, as well as atherosclerosis of the great vessels of the mediastinum and the coronary arteries, including calcified atherosclerotic plaque in the left main, left anterior descending and right coronary arteries. Mediastinum/Nodes: No pathologically enlarged mediastinal or hilar lymph nodes. Please note that accurate exclusion of hilar adenopathy is limited on noncontrast CT scans. Esophagus is unremarkable in appearance. No axillary lymphadenopathy. Lungs/Pleura: Multiple new pulmonary nodules are noted in the lungs bilaterally, largest of which are in the left lower lobe (axial image 90 of series 4) measuring up to 6 mm. No acute consolidative airspace disease. No pleural effusions. Upper Abdomen: Soft tissue  stranding around the pancreas concerning for acute pancreatitis. Previously demonstrated pancreatic mass is not well appreciated on today's noncontrast CT examination (please see recent abdominal MRI 06/15/2020 for full description of these findings). High attenuation material in the gallbladder likely represents vicarious excretion  of gadolinium from recent contrast enhanced abdominal MRI. Aortic atherosclerosis. Musculoskeletal: Flowing anterior osteophytes throughout the thoracic spine, suggesting diffuse idiopathic skeletal hyperostosis (DISH). There are no aggressive appearing lytic or blastic lesions noted in the visualized portions of the skeleton. IMPRESSION: 1. Multiple new pulmonary nodules scattered throughout the lungs bilaterally measuring up to 6 mm in the left lower lobe. These are nonspecific, but the possibility of metastatic disease is not excluded. Close attention on future follow-up studies is recommended to ensure the stability or regression of these findings. 2. Aortic atherosclerosis, in addition to left main and 2 vessel coronary artery disease. Please note that although the presence of coronary artery calcium documents the presence of coronary artery disease, the severity of this disease and any potential stenosis cannot be assessed on this non-gated CT examination. Assessment for potential risk factor modification, dietary therapy or pharmacologic therapy may be warranted, if clinically indicated. Aortic Atherosclerosis (ICD10-I70.0). Electronically Signed   By: Vinnie Langton M.D.   On: 06/18/2020 08:44     Medical Consultants:    None.   Subjective:    Timothy Page down today no new complaints.  Objective:    Vitals:   06/18/20 1227 06/18/20 1821 06/18/20 2021 06/19/20 0450  BP: (!) 161/97 138/86 (!) 145/92 132/87  Pulse: 61 79 77 (!) 59  Resp: 18 18 18 18   Temp: 97.6 F (36.4 C) 98.2 F (36.8 C) 98.8 F (37.1 C) 98.6 F (37 C)  TempSrc: Oral  Oral Oral  SpO2: 98% 100% 97% 98%  Weight:      Height:       SpO2: 98 % O2 Flow Rate (L/min): 2 L/min   Intake/Output Summary (Last 24 hours) at 06/19/2020 0842 Last data filed at 06/19/2020 0600 Gross per 24 hour  Intake 1900 ml  Output 700 ml  Net 1200 ml   Filed Weights   06/14/20 1818 06/15/20 0257  Weight: 87.5 kg 84.7 kg     Exam: General exam: In no acute distress. Respiratory system: Good air movement and clear to auscultation. Cardiovascular system: S1 & S2 heard, RRR. No JVD. Gastrointestinal system: Abdomen is nondistended, soft and nontender.  Extremities: No pedal edema. Skin: No rashes, lesions or ulcers Psychiatry: Judgement and insight appear normal. Mood & affect appropriate.   Data Reviewed:    Labs: Basic Metabolic Panel: Recent Labs  Lab 06/12/20 0926 06/14/20 1859 06/15/20 0508 06/16/20 0141 06/17/20 0223  NA 138 138 139 139 139  K 4.1 3.9 3.3* 3.9 3.9  CL 105 104 104 105 105  CO2 26 23 27 26 26   GLUCOSE 155* 114* 122* 96 121*  BUN 17 17 11 9 8   CREATININE 1.08 1.12 1.04 0.99 1.00  CALCIUM 9.6 9.7 9.5 9.3 9.2  MG  --   --  1.9  --   --    GFR Estimated Creatinine Clearance: 75.6 mL/min (by C-G formula based on SCr of 1 mg/dL). Liver Function Tests: Recent Labs  Lab 06/12/20 0926 06/14/20 1859 06/15/20 0508 06/16/20 0141 06/17/20 0223  AST 14 15 13* 11* 11*  ALT 16 14 13 11 10   ALKPHOS 81 75 76 72 68  BILITOT 0.5 0.2* 0.9 0.4 0.6  PROT 7.0 7.3 6.4*  6.1* 5.8*  ALBUMIN 4.2 4.1 3.7 3.5 3.3*   Recent Labs  Lab 06/12/20 0926 06/14/20 1859  LIPASE 15.0 66*   No results for input(s): AMMONIA in the last 168 hours. Coagulation profile No results for input(s): INR, PROTIME in the last 168 hours. COVID-19 Labs  No results for input(s): DDIMER, FERRITIN, LDH, CRP in the last 72 hours.  Lab Results  Component Value Date   SARSCOV2NAA NEGATIVE 06/15/2020   SARSCOV2NAA RESULT: NEGATIVE 01/22/2020    CBC: Recent Labs  Lab 06/12/20 0926 06/14/20 1859 06/15/20 0508 06/16/20 0141 06/17/20 0223  WBC 5.3 6.5 6.0 5.9 5.4  NEUTROABS 3.7 4.5  --   --   --   HGB 13.7 14.0 13.3 12.6* 12.1*  HCT 39.9 40.5 38.2* 37.5* 36.4*  MCV 89.3 88.6 88.4 91.0 90.1  PLT 175.0 199 170 175 171   Cardiac Enzymes: No results for input(s): CKTOTAL, CKMB, CKMBINDEX, TROPONINI in  the last 168 hours. BNP (last 3 results) No results for input(s): PROBNP in the last 8760 hours. CBG: Recent Labs  Lab 06/18/20 1126 06/18/20 1809 06/18/20 2020 06/19/20 0002 06/19/20 0448  GLUCAP 132* 199* 166* 78 136*   D-Dimer: No results for input(s): DDIMER in the last 72 hours. Hgb A1c: No results for input(s): HGBA1C in the last 72 hours. Lipid Profile: No results for input(s): CHOL, HDL, LDLCALC, TRIG, CHOLHDL, LDLDIRECT in the last 72 hours. Thyroid function studies: No results for input(s): TSH, T4TOTAL, T3FREE, THYROIDAB in the last 72 hours.  Invalid input(s): FREET3 Anemia work up: No results for input(s): VITAMINB12, FOLATE, FERRITIN, TIBC, IRON, RETICCTPCT in the last 72 hours. Sepsis Labs: Recent Labs  Lab 06/14/20 1859 06/15/20 0508 06/16/20 0141 06/17/20 0223  WBC 6.5 6.0 5.9 5.4  LATICACIDVEN 1.0  --   --   --    Microbiology Recent Results (from the past 240 hour(s))  SARS CORONAVIRUS 2 (TAT 6-24 HRS) Nasopharyngeal Nasopharyngeal Swab     Status: None   Collection Time: 06/15/20 12:09 AM   Specimen: Nasopharyngeal Swab  Result Value Ref Range Status   SARS Coronavirus 2 NEGATIVE NEGATIVE Final    Comment: (NOTE) SARS-CoV-2 target nucleic acids are NOT DETECTED.  The SARS-CoV-2 RNA is generally detectable in upper and lower respiratory specimens during the acute phase of infection. Negative results do not preclude SARS-CoV-2 infection, do not rule out co-infections with other pathogens, and should not be used as the sole basis for treatment or other patient management decisions. Negative results must be combined with clinical observations, patient history, and epidemiological information. The expected result is Negative.  Fact Sheet for Patients: SugarRoll.be  Fact Sheet for Healthcare Providers: https://www.woods-mathews.com/  This test is not yet approved or cleared by the [place] FDA and   has been authorized for detection and/or diagnosis of SARS-CoV-2 by FDA under an Emergency Use Authorization (EUA). This EUA will remain  in effect (meaning this test can be used) for the duration of the COVID-19 declaration under Se ction 564(b)(1) of the Act, 21 U.S.C. section 360bbb-3(b)(1), unless the authorization is terminated or revoked sooner.  Performed at [facility], [place] [address] [place], [place] [address]      Medications:   . amLODipine  10 mg Oral Daily  . cloNIDine  0.2 mg Oral QHS  . indomethacin  100 mg Rectal Once  . insulin aspart  0-15 Units Subcutaneous Q4H  . insulin glargine  7 Units Subcutaneous Q2200  . levothyroxine  137  mcg Oral Q0600   Continuous Infusions: . lactated ringers 10 mL/hr at 06/18/20 1839      LOS: 4 days   [facility]  06/19/2020, 8:42 AM

## 2020-06-19 NOTE — Consult Note (Addendum)
[place]  [phone]) [phone] [phone]   MEDICAL ONCOLOGY - INITIAL CONSULTATION  Referral MD: Dr. Justice Britain  Reason for Referral: Pancreatic adenocarcinoma  HPI: Timothy Page is a 65 year old male with a past medical history significant for diabetes mellitus, history of thyroidectomy for Hurthle cell adenoma, hyperlipidemia, hypertension, history of MI at age 29, history of CVA in his 40s.  The patient presented to the hospital with abdominal pain x3 weeks.  Pain was worse on his right side.  The day prior to admission, the pain was so bad he cannot lay down and could not eat or sleep.  He was also experiencing nausea without vomiting.  He has had a poor appetite.  CT of the abdomen/pelvis with contrast performed on admission showed abnormal appearance of the pancreas with adjacent fat stranding, pancreatic ductal dilatation, and fullness of the pancreatic head and distal pancreatic body-findings may be secondary to acute pancreatitis but the fullness of the pancreatic head and pancreatic ductal dilatation is suspicious for an underlying obstructing lesion.  MRCP was performed on 06/15/2020 which showed a hypoenhancing lesion in the superior pancreatic head and neck measuring approximate 3.2 x 2.4 cm, distal to this lesion there is gross dilatation of the pancreatic duct measuring up to 9 mm in caliber, the central superior mesenteric vein and portal vein are abruptly effaced or occluded within the pancreatic head, constellation of findings is highly suspicious for pancreatic adenocarcinoma, there is a rim-enhancing lesion of the right lobe of the liver abutting the gallbladder fossa, hepatic segment V, measuring 1.2 x 1.1 cm which is difficult to definitively characterize due to small size but highly suspicious for metastatic disease.  CT of the chest was performed 06/17/2020 which showed multiple new pulmonary nodules throughout the lungs bilaterally measuring up to 6 mm  in the left lower lobe which are nonspecific but possibly metastatic disease is not excluded.  He underwent an EUS on 06/18/2020 which demonstrated a mass in the pancreatic head/genu, cytology results pending but the endosonographic appearance is suspicious for adenocarcinoma, fine-needle biopsy performed.  Because of imaging findings suggestive of a lesion in the liver as well as pulmonary nodules, there is concern that a metastatic process could be occurring, this was staged as T4N0M (X-1-2).  Cytology from the pancreatic head showed malignant cells consistent with adenocarcinoma.  A CA 19.9 was obtained on 06/16/2020 and was normal at 28.  The patient was sitting up in the recliner chair.  His wife and daughter are at the bedside.  The patient continues to have abdominal pain.  He states that his pain medication last approximately 3 hours.  Eating makes his pain worse.  He reports that he has had no appetite and has lost about 50 pounds over the past year.  He reports mild nausea but no vomiting.  He has also experienced constipation recently.  He denies fevers, chills, headaches, dizziness.  Denies chest pain shortness of breath.  He has not noticed any bleeding or lower extremity edema.  Medical oncology was asked see the patient to make recommendations regarding his newly diagnosed pancreatic adenocarcinoma.   Past Medical History:  Diagnosis Date   Arthritis    Atypical chest pain 04/2017   ACS ruled out 05/11/17   CAP (community acquired pneumonia) 05/07/2017   Diabetes mellitus without complication ([place])    Poor control starting fall 2021-->GAD ab neg.  Insulin and C peptide levels wnl but on lower end of normal->Dr Kuma/endo 2022.   History  of thyroidectomy, total 2018   Hurlthe cell adenoma   Hx of adenomatous polyp of colon 07/02/2009   rpt colonoscopy 2026   Hyperlipidemia    Intol of multiple statins and zetia. [facility] 2022.   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 Cammy Copa   Right ureteral calculus 04/2018   Cystoscopy with right retrograde pyelogram and right ureteral stent placement after stone removal.   Seasonal allergies    Stroke ([place])    in 33A no complications    Tachyarrhythmia 1980   age 38 X 2 over 25 month period.  No recurrence since that time.   Thoracic aortic aneurysm ([facility])    Incidentally discovered, 4.1 cm-->Dr. Cyndia Bent. Rpt CT 08/2018 and 08/2019 stable.   Vocal cord paralysis 07/2016   Left vocal cord paralysis noted after thyroid surgery.   :  Past Surgical History:  Procedure Laterality Date   BIOPSY  06/18/2020   Procedure: BIOPSY;  Surgeon: Rush Landmark Telford Nab., MD;  Location: [facility] ENDOSCOPY;  Service: Gastroenterology;;   [place] W/ POLYPECTOMY  07/02/2009; 01/24/20   2011 adenoma.  2021 adenomas x 3->recall 2026   CYSTOSCOPY WITH RETROGRADE PYELOGRAM, URETEROSCOPY AND STENT PLACEMENT Right 05/09/2018   Procedure: CYSTOSCOPY WITH RIGHT RETROGRADE RIGHT URETEROSCOPY STONE EXTRACTION RIGHT STENT EXCHANGE;  Surgeon: Lucas Mallow, MD;  Location: [facility] ORS;  Service: Urology;  Laterality: Right;   CYSTOSCOPY/RETROGRADE/URETEROSCOPY/STONE EXTRACTION WITH BASKET Right 04/30/2018   Procedure: CYSTOSCOPY/RETROGRADE/RIGHT URETEROSCOPY/;  Surgeon: Lucas Mallow, MD;  Location: [facility] ORS;  Service: Urology;  Laterality: Right;   ESOPHAGOGASTRODUODENOSCOPY  07/02/2009   NORMAL   ESOPHAGOGASTRODUODENOSCOPY (EGD) WITH PROPOFOL N/A 06/18/2020   Procedure: ESOPHAGOGASTRODUODENOSCOPY (EGD) WITH PROPOFOL;  Surgeon: Rush Landmark Telford Nab., MD;  Location: [place];  Service: Gastroenterology;  Laterality: N/A;   EYE SURGERY     FINE NEEDLE ASPIRATION   06/18/2020   Procedure: FINE NEEDLE ASPIRATION (FNA) LINEAR;  Surgeon: Irving Copas., MD;  Location: [place];  Service: Gastroenterology;;   finger amputaion     with reattachement   INCISION AND DRAINAGE ABSCESS ANAL  11/23/2019   gen surg (In ED).   LASIK     THYROIDECTOMY N/A 08/03/2016   Procedure: Total THYROIDECTOMY;  Surgeon: [name], MD;  Location: [place];  Service: ENT;  Laterality: N/A;  Total Thyroidectomy    UPPER ESOPHAGEAL ENDOSCOPIC ULTRASOUND (EUS) N/A 06/18/2020   Procedure: UPPER ESOPHAGEAL ENDOSCOPIC ULTRASOUND (EUS);  Surgeon: Irving Copas., MD;  Location: [place];  Service: Gastroenterology;  Laterality: N/A;   UPPER GASTROINTESTINAL ENDOSCOPY     with EUS and bx of pancreatic mass. +appearance of candida in esoph.     :  Current Facility-Administered Medications  Medication Dose Route Frequency Provider Last Rate Last Admin   acetaminophen (TYLENOL) tablet 650 mg  650 mg Oral Q6H PRN Crosley, Debby, MD       Or   acetaminophen (TYLENOL) suppository 650 mg  650 mg Rectal Q6H PRN Crosley, Debby, MD       amLODipine (NORVASC) tablet 10 mg  10 mg Oral Daily Crosley, Debby, MD   10 mg at 06/19/20 0856   cloNIDine (  CATAPRES) tablet 0.2 mg  0.2 mg Oral QHS Domenic Polite, MD   0.2 mg at 06/18/20 2112   hydrALAZINE (APRESOLINE) injection 5 mg  5 mg Intravenous Q6H PRN Quintella Baton, MD       HYDROmorphone (DILAUDID) injection 1 mg  1 mg Intravenous Q3H PRN Quintella Baton, MD   1 mg at 06/19/20 1245   indomethacin (INDOCIN) 50 MG suppository 100 mg  100 mg Rectal Once Vena Rua, PA-C       insulin aspart (novoLOG) injection 0-15 Units  0-15 Units Subcutaneous Q4H Quintella Baton, MD   5 Units at 06/19/20 0857   insulin glargine (LANTUS) injection 7 Units  7 Units Subcutaneous Q2200 Quintella Baton, MD   7 Units at 06/18/20 2112   lactated ringers infusion   Intravenous Continuous Charlynne Cousins, MD 10 mL/hr at 06/18/20 1839 Infusion  Verify at 06/18/20 1839   levothyroxine (SYNTHROID) tablet 137 mcg  137 mcg Oral Q0600 Quintella Baton, MD   137 mcg at 06/19/20 0456   ondansetron (ZOFRAN) tablet 4 mg  4 mg Oral Q6H PRN Quintella Baton, MD       Or   ondansetron (ZOFRAN) injection 4 mg  4 mg Intravenous Q6H PRN Claria Dice, Debby, MD   4 mg at 06/18/20 1512   polyethylene glycol (MIRALAX / GLYCOLAX) packet 17 g  17 g Oral Daily PRN Quintella Baton, MD   17 g at 06/19/20 1015   zolpidem (AMBIEN) tablet 5 mg  5 mg Oral QHS PRN Charlynne Cousins, MD         Allergies  Allergen Reactions   Atenolol     Severe "slowing"of functioning Severe "slowing"of functioning   Hydroxyzine Other (See Comments)    "felt like I was coming out of my skin"   Tetracycline     Rash Because of a history of documented adverse serious drug reaction;Medi Alert bracelet  is recommended Rash Because of a history of documented adverse serious drug reaction;Medi Alert bracelet  is recommended   Atorvastatin     D/Ced by him due to Myalgias in Sept 2014 D/Ced by him due to Myalgias in Sept 2014   Codeine     Hallucinations. "I see pink elephants"   Guaifenesin     Other reaction(s): Other (See Comments) unknown unknown   Mucinex [Guaifenesin Er] Other (See Comments)    Hallucinations "I see pink elephants"   :  Family History  Problem Relation Age of Onset   Diabetes Mother    Cancer Mother        lung   COPD Mother    Brain cancer Mother    Arthritis Mother    Hyperlipidemia Mother    Heart disease Mother    Hypertension Mother    Diabetes Father    Cancer Father        mesothelioma; skin   Mental illness Father    Arthritis Father    Hyperlipidemia Father    Heart disease Father    Hypertension Father    Heart disease Maternal Grandmother        MI @ 28   Heart disease Maternal Grandfather        MI @ 63   Cancer Paternal Grandmother    Diabetes Paternal Grandmother    Heart disease Paternal Grandfather        MI @ 20    Thyroid cancer Maternal Aunt    Colon cancer Neg Hx    Esophageal cancer Neg  Hx    Rectal cancer Neg Hx    Stomach cancer Neg Hx    :  Social History   Socioeconomic History   Marital status: Married    Spouse name: Not on file   Number of children: Not on file   Years of education: Not on file   Highest education level: Not on file  Occupational History   Not on file  Tobacco Use   Smoking status: Never Smoker   Smokeless tobacco: Current User    Types: Snuff  Vaping Use   Vaping Use: Never used  Substance and Sexual Activity   Alcohol use: No   Drug use: No   Sexual activity: Not on file  Other Topics Concern   Not on file  Social History Narrative   Married, 2 daughters, 1 grandson that lives with him.   Educ: some college   Occup: retired from the [employer].  Retired at 45 but returned to work at some point, now with grandson living with him.   No T/A/Ds.   Social Determinants of Health   Financial Resource Strain: Not on file  Food Insecurity: Not on file  Transportation Needs: Not on file  Physical Activity: Not on file  Stress: Not on file  Social Connections: Not on file  Intimate Partner Violence: Not on file  :  Review of Systems: A comprehensive 14 point review of systems was negative except as noted in the HPI.  Exam: Patient Vitals for the past 24 hrs:  BP Temp Temp src Pulse Resp SpO2  06/19/20 1012 137/84 98.1 F (36.7 C) Oral 69 16 99 %  06/19/20 0450 132/87 98.6 F (37 C) Oral (!) 59 18 98 %  06/18/20 2021 (!) 145/92 98.8 F (37.1 C) Oral 77 18 97 %  06/18/20 1821 138/86 98.2 F (36.8 C) -- 79 18 100 %    General:  well-nourished in no acute distress.   Eyes:  no scleral icterus.   ENT:  There were no oropharyngeal lesions.   Neck was without thyromegaly.   Lymphatics:  Negative cervical, supraclavicular or axillary adenopathy.   Respiratory: lungs were clear bilaterally without wheezing or crackles.   Cardiovascular:   Regular rate and rhythm, S1/S2, without murmur, rub or gallop.  There was no pedal edema.   GI: Positive bowel sounds, soft, tenderness with light palpation. Musculoskeletal:  no spinal tenderness of palpation of vertebral spine.   Skin exam was without echymosis, petichae.   Neuro exam was nonfocal. Patient was alert and oriented.  Attention was good.   Language was appropriate.  Mood was normal without depression.  Speech was not pressured.  Thought content was not tangential.     Lab Results  Component Value Date   WBC 5.4 06/17/2020   HGB 12.1 (L) 06/17/2020   HCT 36.4 (L) 06/17/2020   PLT 171 06/17/2020   GLUCOSE 121 (H) 06/17/2020   CHOL 148 06/15/2020   TRIG 55 06/15/2020   HDL 34 (L) 06/15/2020   LDLDIRECT 116.0 10/30/2019   LDLCALC 103 (H) 06/15/2020   ALT 10 06/17/2020   AST 11 (L) 06/17/2020   NA 139 06/17/2020   K 3.9 06/17/2020   CL 105 06/17/2020   CREATININE 1.00 06/17/2020   BUN 8 06/17/2020   CO2 26 06/17/2020    CT CHEST WO CONTRAST  Result Date: 06/18/2020 CLINICAL DATA:  65 year old male with history of pancreatic cancer. Staging examination. EXAM: CT CHEST WITHOUT CONTRAST TECHNIQUE: Multidetector CT imaging  of the chest was performed following the standard protocol without IV contrast. COMPARISON:  Chest CT 09/11/2019. FINDINGS: Cardiovascular: Heart size is normal. There is no significant pericardial fluid, thickening or pericardial calcification. There is aortic atherosclerosis, as well as atherosclerosis of the great vessels of the mediastinum and the coronary arteries, including calcified atherosclerotic plaque in the left main, left anterior descending and right coronary arteries. Mediastinum/Nodes: No pathologically enlarged mediastinal or hilar lymph nodes. Please note that accurate exclusion of hilar adenopathy is limited on noncontrast CT scans. Esophagus is unremarkable in appearance. No axillary lymphadenopathy. Lungs/Pleura: Multiple new pulmonary  nodules are noted in the lungs bilaterally, largest of which are in the left lower lobe (axial image 90 of series 4) measuring up to 6 mm. No acute consolidative airspace disease. No pleural effusions. Upper Abdomen: Soft tissue stranding around the pancreas concerning for acute pancreatitis. Previously demonstrated pancreatic mass is not well appreciated on today's noncontrast CT examination (please see recent abdominal MRI 06/15/2020 for full description of these findings). High attenuation material in the gallbladder likely represents vicarious excretion of gadolinium from recent contrast enhanced abdominal MRI. Aortic atherosclerosis. Musculoskeletal: Flowing anterior osteophytes throughout the thoracic spine, suggesting diffuse idiopathic skeletal hyperostosis (DISH). There are no aggressive appearing lytic or blastic lesions noted in the visualized portions of the skeleton. IMPRESSION: 1. Multiple new pulmonary nodules scattered throughout the lungs bilaterally measuring up to 6 mm in the left lower lobe. These are nonspecific, but the possibility of metastatic disease is not excluded. Close attention on future follow-up studies is recommended to ensure the stability or regression of these findings. 2. Aortic atherosclerosis, in addition to left main and 2 vessel coronary artery disease. Please note that although the presence of coronary artery calcium documents the presence of coronary artery disease, the severity of this disease and any potential stenosis cannot be assessed on this non-gated CT examination. Assessment for potential risk factor modification, dietary therapy or pharmacologic therapy may be warranted, if clinically indicated. Aortic Atherosclerosis (ICD10-I70.0). Electronically Signed   By: Vinnie Langton M.D.   On: 06/18/2020 08:44   CT ABDOMEN PELVIS W CONTRAST  Result Date: 06/14/2020 CLINICAL DATA:  Diffuse abdominal pain. Bilateral flank pain for 3 weeks. EXAM: CT ABDOMEN AND PELVIS  WITH CONTRAST TECHNIQUE: Multidetector CT imaging of the abdomen and pelvis was performed using the standard protocol following bolus administration of intravenous contrast. CONTRAST:  135m OMNIPAQUE IOHEXOL 300 MG/ML  SOLN COMPARISON:  11/22/2019 FINDINGS: Lower chest: The lung bases are clear. The heart size is normal. Hepatobiliary: The liver is normal. Normal gallbladder.There is no biliary ductal dilation. Pancreas: There are inflammatory changes about the pancreas. There is pancreatic ductal dilatation with a possible abrupt cutoff in the distal pancreatic body. There is fullness of the pancreatic head with a heterogeneous appearance and areas of decreased attenuation. Spleen: Unremarkable. Adrenals/Urinary Tract: --Adrenal glands: Unremarkable. --Right kidney/ureter: No hydronephrosis or radiopaque kidney stones. --Left kidney/ureter: No hydronephrosis or radiopaque kidney stones. --Urinary bladder: Unremarkable. Stomach/Bowel: --Stomach/Duodenum: No hiatal hernia or other gastric abnormality. Normal duodenal course and caliber. --Small bowel: Unremarkable. --Colon: Unremarkable. --Appendix: Normal. Vascular/Lymphatic: Atherosclerotic calcification is present within the non-aneurysmal abdominal aorta, without hemodynamically significant stenosis. --No retroperitoneal lymphadenopathy. --No mesenteric lymphadenopathy. --No pelvic or inguinal lymphadenopathy. Reproductive: Unremarkable Other: No ascites or free air. The abdominal wall is normal. Musculoskeletal. No acute displaced fractures. IMPRESSION: 1. Abnormal appearance of the pancreas with adjacent fat stranding, pancreatic ductal dilatation, and fullness of the pancreatic head and distal pancreatic body.  Findings may be secondary to acute pancreatitis. However, the fullness of the pancreatic head and pancreatic ductal dilatation is suspicious for an underlying obstructing lesion. As such, follow-up with a nonemergent contrast enhanced MRI is recommended  for further evaluation of this finding. 2. No other potentially acute abnormality detected in the abdomen or pelvis. Electronically Signed   By: Constance Holster M.D.   On: 06/14/2020 20:19   MR ABDOMEN MRCP W WO CONTAST  Result Date: 06/15/2020 CLINICAL DATA:  Abnormal pancreas on prior CT, possible acute pancreatitis versus mass EXAM: MRI ABDOMEN WITHOUT AND WITH CONTRAST (INCLUDING MRCP) TECHNIQUE: Multiplanar multisequence MR imaging of the abdomen was performed both before and after the administration of intravenous contrast. Heavily T2-weighted images of the biliary and pancreatic ducts were obtained, and three-dimensional MRCP images were rendered by post processing. CONTRAST:  8.74m GADAVIST GADOBUTROL 1 MMOL/ML IV SOLN COMPARISON:  CT abdomen pelvis, 06/14/2020, 11/22/2019 FINDINGS: Lower chest: No acute findings. Hepatobiliary: There is a rim enhancing lesion of the right lobe of the liver abutting the gallbladder fossa, hepatic segment V, measuring 1.2 x 1.1 cm (series 2, image 42). No biliary ductal dilatation. Pancreas: Diffuse inflammatory fat stranding about the pancreas. There is a hypoenhancing lesion in the superior pancreatic head and neck measuring approximately 3.2 x 2.4 cm (series 20, image 48). Distal to this lesion, there is gross dilatation of the pancreatic duct, measuring up to 9 mm in caliber. The duct is not visualized through the pancreatic head, but is reconstituted near the ampulla. The central superior mesenteric vein and portal vein are abruptly effaced or occluded within the pancreatic head. Spleen:  Within normal limits in size and appearance. Adrenals/Urinary Tract: No masses identified. No evidence of hydronephrosis. Stomach/Bowel: Visualized portions within the abdomen are unremarkable. Vascular/Lymphatic: No pathologically enlarged lymph nodes identified. No abdominal aortic aneurysm demonstrated. Other:  None. Musculoskeletal: No suspicious bone lesions identified.  IMPRESSION: 1. There is a hypoenhancing lesion in the superior pancreatic head and neck measuring approximately 3.2 x 2.4 cm. 2. Distal to this lesion, there is gross dilatation of the pancreatic duct, measuring up to 9 mm in caliber. The duct is not visualized through the pancreatic head, but is reconstituted near the ampulla. 3. The central superior mesenteric vein and portal vein are abruptly effaced or occluded within the pancreatic head. 4. Constellation of findings is highly suspicious for pancreatic adenocarcinoma. 5. There is a rim enhancing lesion of the right lobe of the liver abutting the gallbladder fossa, hepatic segment V, measuring 1.2 x 1.1 cm. Although difficult to definitively characterize due to small size, there are no clearly benign contrast enhancement characteristics and this is highly suspicious for metastatic disease. 6. Diffuse inflammatory fat stranding about the pancreas, consistent with acute pancreatitis. Findings within the pancreatic head head and neck could reflect phlegmonous change related to pancreatitis, however fullness of the pancreatic head neck has substantially increased when compared to more remote prior dated 11/22/2019 and adenocarcinoma is the diagnosis of exclusion. Electronically Signed   By: AEddie CandleM.D.   On: 06/15/2020 20:58     CT CHEST WO CONTRAST  Result Date: 06/18/2020 CLINICAL DATA:  65year old male with history of pancreatic cancer. Staging examination. EXAM: CT CHEST WITHOUT CONTRAST TECHNIQUE: Multidetector CT imaging of the chest was performed following the standard protocol without IV contrast. COMPARISON:  Chest CT 09/11/2019. FINDINGS: Cardiovascular: Heart size is normal. There is no significant pericardial fluid, thickening or pericardial calcification. There is aortic atherosclerosis, as well as  atherosclerosis of the great vessels of the mediastinum and the coronary arteries, including calcified atherosclerotic plaque in the left main,  left anterior descending and right coronary arteries. Mediastinum/Nodes: No pathologically enlarged mediastinal or hilar lymph nodes. Please note that accurate exclusion of hilar adenopathy is limited on noncontrast CT scans. Esophagus is unremarkable in appearance. No axillary lymphadenopathy. Lungs/Pleura: Multiple new pulmonary nodules are noted in the lungs bilaterally, largest of which are in the left lower lobe (axial image 90 of series 4) measuring up to 6 mm. No acute consolidative airspace disease. No pleural effusions. Upper Abdomen: Soft tissue stranding around the pancreas concerning for acute pancreatitis. Previously demonstrated pancreatic mass is not well appreciated on today's noncontrast CT examination (please see recent abdominal MRI 06/15/2020 for full description of these findings). High attenuation material in the gallbladder likely represents vicarious excretion of gadolinium from recent contrast enhanced abdominal MRI. Aortic atherosclerosis. Musculoskeletal: Flowing anterior osteophytes throughout the thoracic spine, suggesting diffuse idiopathic skeletal hyperostosis (DISH). There are no aggressive appearing lytic or blastic lesions noted in the visualized portions of the skeleton. IMPRESSION: 1. Multiple new pulmonary nodules scattered throughout the lungs bilaterally measuring up to 6 mm in the left lower lobe. These are nonspecific, but the possibility of metastatic disease is not excluded. Close attention on future follow-up studies is recommended to ensure the stability or regression of these findings. 2. Aortic atherosclerosis, in addition to left main and 2 vessel coronary artery disease. Please note that although the presence of coronary artery calcium documents the presence of coronary artery disease, the severity of this disease and any potential stenosis cannot be assessed on this non-gated CT examination. Assessment for potential risk factor modification, dietary therapy or  pharmacologic therapy may be warranted, if clinically indicated. Aortic Atherosclerosis (ICD10-I70.0). Electronically Signed   By: Vinnie Langton M.D.   On: 06/18/2020 08:44   CT ABDOMEN PELVIS W CONTRAST  Result Date: 06/14/2020 CLINICAL DATA:  Diffuse abdominal pain. Bilateral flank pain for 3 weeks. EXAM: CT ABDOMEN AND PELVIS WITH CONTRAST TECHNIQUE: Multidetector CT imaging of the abdomen and pelvis was performed using the standard protocol following bolus administration of intravenous contrast. CONTRAST:  113m OMNIPAQUE IOHEXOL 300 MG/ML  SOLN COMPARISON:  11/22/2019 FINDINGS: Lower chest: The lung bases are clear. The heart size is normal. Hepatobiliary: The liver is normal. Normal gallbladder.There is no biliary ductal dilation. Pancreas: There are inflammatory changes about the pancreas. There is pancreatic ductal dilatation with a possible abrupt cutoff in the distal pancreatic body. There is fullness of the pancreatic head with a heterogeneous appearance and areas of decreased attenuation. Spleen: Unremarkable. Adrenals/Urinary Tract: --Adrenal glands: Unremarkable. --Right kidney/ureter: No hydronephrosis or radiopaque kidney stones. --Left kidney/ureter: No hydronephrosis or radiopaque kidney stones. --Urinary bladder: Unremarkable. Stomach/Bowel: --Stomach/Duodenum: No hiatal hernia or other gastric abnormality. Normal duodenal course and caliber. --Small bowel: Unremarkable. --Colon: Unremarkable. --Appendix: Normal. Vascular/Lymphatic: Atherosclerotic calcification is present within the non-aneurysmal abdominal aorta, without hemodynamically significant stenosis. --No retroperitoneal lymphadenopathy. --No mesenteric lymphadenopathy. --No pelvic or inguinal lymphadenopathy. Reproductive: Unremarkable Other: No ascites or free air. The abdominal wall is normal. Musculoskeletal. No acute displaced fractures. IMPRESSION: 1. Abnormal appearance of the pancreas with adjacent fat stranding, pancreatic  ductal dilatation, and fullness of the pancreatic head and distal pancreatic body. Findings may be secondary to acute pancreatitis. However, the fullness of the pancreatic head and pancreatic ductal dilatation is suspicious for an underlying obstructing lesion. As such, follow-up with a nonemergent contrast enhanced MRI is recommended for further evaluation  of this finding. 2. No other potentially acute abnormality detected in the abdomen or pelvis. Electronically Signed   By: Constance Holster M.D.   On: 06/14/2020 20:19   MR ABDOMEN MRCP W WO CONTAST  Result Date: 06/15/2020 CLINICAL DATA:  Abnormal pancreas on prior CT, possible acute pancreatitis versus mass EXAM: MRI ABDOMEN WITHOUT AND WITH CONTRAST (INCLUDING MRCP) TECHNIQUE: Multiplanar multisequence MR imaging of the abdomen was performed both before and after the administration of intravenous contrast. Heavily T2-weighted images of the biliary and pancreatic ducts were obtained, and three-dimensional MRCP images were rendered by post processing. CONTRAST:  8.47m GADAVIST GADOBUTROL 1 MMOL/ML IV SOLN COMPARISON:  CT abdomen pelvis, 06/14/2020, 11/22/2019 FINDINGS: Lower chest: No acute findings. Hepatobiliary: There is a rim enhancing lesion of the right lobe of the liver abutting the gallbladder fossa, hepatic segment V, measuring 1.2 x 1.1 cm (series 2, image 42). No biliary ductal dilatation. Pancreas: Diffuse inflammatory fat stranding about the pancreas. There is a hypoenhancing lesion in the superior pancreatic head and neck measuring approximately 3.2 x 2.4 cm (series 20, image 48). Distal to this lesion, there is gross dilatation of the pancreatic duct, measuring up to 9 mm in caliber. The duct is not visualized through the pancreatic head, but is reconstituted near the ampulla. The central superior mesenteric vein and portal vein are abruptly effaced or occluded within the pancreatic head. Spleen:  Within normal limits in size and appearance.  Adrenals/Urinary Tract: No masses identified. No evidence of hydronephrosis. Stomach/Bowel: Visualized portions within the abdomen are unremarkable. Vascular/Lymphatic: No pathologically enlarged lymph nodes identified. No abdominal aortic aneurysm demonstrated. Other:  None. Musculoskeletal: No suspicious bone lesions identified. IMPRESSION: 1. There is a hypoenhancing lesion in the superior pancreatic head and neck measuring approximately 3.2 x 2.4 cm. 2. Distal to this lesion, there is gross dilatation of the pancreatic duct, measuring up to 9 mm in caliber. The duct is not visualized through the pancreatic head, but is reconstituted near the ampulla. 3. The central superior mesenteric vein and portal vein are abruptly effaced or occluded within the pancreatic head. 4. Constellation of findings is highly suspicious for pancreatic adenocarcinoma. 5. There is a rim enhancing lesion of the right lobe of the liver abutting the gallbladder fossa, hepatic segment V, measuring 1.2 x 1.1 cm. Although difficult to definitively characterize due to small size, there are no clearly benign contrast enhancement characteristics and this is highly suspicious for metastatic disease. 6. Diffuse inflammatory fat stranding about the pancreas, consistent with acute pancreatitis. Findings within the pancreatic head head and neck could reflect phlegmonous change related to pancreatitis, however fullness of the pancreatic head neck has substantially increased when compared to more remote prior dated 11/22/2019 and adenocarcinoma is the diagnosis of exclusion. Electronically Signed   By: AEddie CandleM.D.   On: 06/15/2020 20:58    Pathology:  CYTOLOGY - NON PAP  CASE: [id number]  PATIENT: Timothy Page  Non-Gynecological Cytology Report   Clinical History: Acute pancreatitis, Question pancreatic neoplasm   FINAL MICROSCOPIC DIAGNOSIS:   A. PANCREAS, HEAD, FINE NEEDLE ASPIRATION:  - Malignant cells consistent with  adenocarcinoma   COMMENT:   Dr. PSaralyn Pilarhas reviewed the case.   Assessment and Plan:   Pancreatic adenocarcinoma -CT scan findings and biopsy results were discussed with the patient and his family. -We discussed that in addition to the pancreatic mass which is confirmed to be malignant, there is also a small lesion in the liver and small pulmonary nodules which  may represent metastatic disease. -The liver lesion is very small and not clear if it can be biopsied to confirm metastatic disease.  Scans will be reviewed by Dr. Chryl Heck and further recommendations will be made when she sees the patient. -We briefly discussed treatment options for pancreatic cancer including surgery if no metastatic disease and the tumor is operable versus neoadjuvant chemotherapy followed by surgery if no evidence of metastatic disease versus systemic chemotherapy only if confirmed to have metastatic disease.  We discussed that his disease would not be curable if he has metastatic disease. -Recommend GI tumor conference discussion of his case. -May need PET scan as an outpatient. -Recommend genetic counseling as an outpatient given new diagnosis of pancreatic cancer.  Abdominal pain secondary to pancreatic mass -Currently receiving IV Dilaudid which has been effective. -Recommend trying him on oral pain medication.  Would start with oxycodone 5 to 10 mg every 3 hours as needed for severe pain.  The patient tells me that he has tolerated this medication well in the past when he took it for a kidney stone.  Thank you for this referral.   Mikey Bussing, DNP, AGPCNP-BC, AOCNP  Attending Note  I personally saw the patient, reviewed the chart and examined the patient. The plan of care was discussed with the patient and the admitting team. I agree with the assessment and plan as documented above. Thank you very much for the consultation.  I have reviewed the images myself, with radiology and with patient today. I  count at least 2/3 lesions ( one lesion is at very edge of the liver, suspect peritoneal mets), bilateral small lung nodules and another area suspicious for peritoneal mets. I agree this is likely a metastatic pancreatic adenocarcinoma. I recommended genetic testing, MSI testing on the tumor and upfront chemotherapy. We discussed this is a stage IV pancreatic adenoca and will not be curable. Intent of treatment is palliative. We will also add him to GI TB for discussion. Patient was of great PS before this abdominal pain onset, was walking 20000 steps a day and is physically very active. We discussed about front line FOLFIRINOX which is our best possible chemo for this setting since he wants to give his best shot. I discussed about possible side effects including but not limited to fatigue, nausea, vomiting, diarrhea, mucositis, coronary vasospasm, severe toxicity which is occasionally fatal in patient with DPD deficiency, neuropathy which can be long lasting with oxaliplatin, cold hypersensitivity, diarrhea, cytopenias, increased risk of infections etc. He understands rarely side effects can be fatal. We will arrange for this outpatient.  We will need a port for chemo administration. If this can be placed inpatient, this will expedite care. Patient and wife wanted to know prognosis, discussed median OS in stage IV pancreatic adeno can be approximately 2 yrs.  Then pain control, he says oxycodone has been very good for short acting medication, will add long acting pain med in preparation for home.  Nutrition, will arrange for outpatient nutrition referral, encouraged him to time and take his pain and nausea meds 20 min before he eats Will recommend outpatient genetic testing,   I will send the message to our NN team to coordinate his care for outpatient management. I spent about 80 minutes in the care of this patient. I reviewed imaging personally, had a very lengthy conversation about the imaging,  pathology results, staging, treatment and chemotherapy.

## 2020-06-19 NOTE — Plan of Care (Signed)
  Problem: Clinical Measurements: Goal: Ability to maintain clinical measurements within normal limits will improve Outcome: Progressing   Problem: Nutrition: Goal: Adequate nutrition will be maintained Outcome: Progressing   Problem: Elimination: Goal: Will not experience complications related to bowel motility Outcome: Progressing   Problem: Pain Managment: Goal: General experience of comfort will improve Outcome: Progressing   Problem: Education: Goal: Knowledge of Pancreatitis treatment and prevention will improve Outcome: Progressing   Problem: Nutritional: Goal: Ability to achieve adequate nutritional intake will improve Outcome: Progressing

## 2020-06-20 ENCOUNTER — Other Ambulatory Visit: Payer: Self-pay | Admitting: Hematology and Oncology

## 2020-06-20 ENCOUNTER — Other Ambulatory Visit: Payer: Self-pay | Admitting: Family Medicine

## 2020-06-20 DIAGNOSIS — K859 Acute pancreatitis without necrosis or infection, unspecified: Secondary | ICD-10-CM | POA: Diagnosis not present

## 2020-06-20 DIAGNOSIS — C259 Malignant neoplasm of pancreas, unspecified: Secondary | ICD-10-CM | POA: Diagnosis not present

## 2020-06-20 DIAGNOSIS — C250 Malignant neoplasm of head of pancreas: Secondary | ICD-10-CM

## 2020-06-20 LAB — GLUCOSE, CAPILLARY
Glucose-Capillary: 126 mg/dL — ABNORMAL HIGH (ref 70–99)
Glucose-Capillary: 152 mg/dL — ABNORMAL HIGH (ref 70–99)
Glucose-Capillary: 159 mg/dL — ABNORMAL HIGH (ref 70–99)
Glucose-Capillary: 167 mg/dL — ABNORMAL HIGH (ref 70–99)
Glucose-Capillary: 171 mg/dL — ABNORMAL HIGH (ref 70–99)
Glucose-Capillary: 195 mg/dL — ABNORMAL HIGH (ref 70–99)

## 2020-06-20 MED ORDER — SENNOSIDES-DOCUSATE SODIUM 8.6-50 MG PO TABS
1.0000 | ORAL_TABLET | Freq: Two times a day (BID) | ORAL | Status: DC
  Administered 2020-06-20 – 2020-06-22 (×3): 1 via ORAL
  Filled 2020-06-20 (×6): qty 1

## 2020-06-20 MED ORDER — POLYETHYLENE GLYCOL 3350 17 G PO PACK
17.0000 g | PACK | Freq: Two times a day (BID) | ORAL | Status: DC
  Administered 2020-06-20 (×2): 17 g via ORAL
  Filled 2020-06-20 (×2): qty 1

## 2020-06-20 MED ORDER — MAGNESIUM CITRATE PO SOLN
1.0000 | Freq: Once | ORAL | Status: AC
  Administered 2020-06-20: 1 via ORAL
  Filled 2020-06-20: qty 296

## 2020-06-20 MED ORDER — MORPHINE SULFATE ER 15 MG PO TBCR
15.0000 mg | EXTENDED_RELEASE_TABLET | Freq: Two times a day (BID) | ORAL | Status: DC
  Administered 2020-06-20 – 2020-06-22 (×5): 15 mg via ORAL
  Filled 2020-06-20 (×5): qty 1

## 2020-06-20 NOTE — Progress Notes (Signed)
START ON PATHWAY REGIMEN - Pancreatic Adenocarcinoma     A cycle is every 14 days:     Oxaliplatin      Leucovorin      Irinotecan      Fluorouracil   **Always confirm dose/schedule in your pharmacy ordering system**  Patient Characteristics: Metastatic Disease, First Line, PS = 0,1, BRCA1/2 and PALB2  Mutation Absent/Unknown Therapeutic Status: Metastatic Disease Line of Therapy: First Line ECOG Performance Status: 0 BRCA1/2 Mutation Status: Awaiting Test Results PALB2 Mutation Status: Awaiting Test Results Intent of Therapy: Non-Curative / Palliative Intent, Discussed with Patient

## 2020-06-20 NOTE — Progress Notes (Signed)
Progress Note    [name]   [id number]:[id number]  DOB: 03/19/1955  DOA: 06/14/2020     5  PCP: Tammi Sou, MD  CC: abdominal pain, weight loss  Hospital Course: Timothy Page is an 65 y.o. male past medical history of type 2 diabetes mellitus, essential hypertension who presented with worsening abdominal pain.  He has also been undergoing significant unintentional weight loss due to pain with eating over the past several months. On work-up he was found to have a mildly elevated lipase and underwent CT abdomen/pelvis which showed findings consistent with acute pancreatitis.  Imaging also showed concern for a pancreatic head lesion.  He then underwent further work-up with GI including EUS on 06/18/20 and biopsies taken. Path now confirmed with pancreatic adenocarcinoma. Staging with MRCP and CT chest shows probable mets in liver, lung, and possibly peritoneum.  He has been evaluated by oncology with plans for outpatient chemo after discharge   Interval History:  Resting in bed this morning with wife bedside.  Discussed findings once again and next steps.  Pain control seems to be biggest obstacle and of importance.  He is also having ongoing constipation and states that the MiraLAX has not been helping.  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  Given patient will remain in hospital while we continue pain control, will reach out to see if port can be placed while here  Acute pancreatitis - LFTs do not suggest evidence of obstruction at this  time - May need intermittent LFTs - Pain still not controlled -Continue oxycodone and Dilaudid - Oncology has added MS Contin on 06/20/2020.  Follow-up response - Continue clear liquid diet, advance as able  Hypothyroidism - Continue Synthroid  Hypertension - Continue amlodipine and clonidine  DM type II - Continue SSI and CBG monitoring  History of CVA - Continue aspirin  Old records reviewed in assessment of this patient   DVT prophylaxis: SCDs Start: 06/15/20 0318   Code Status:   Code Status: Full Code Family Communication: wife  Disposition Plan: Status is: Inpatient  Remains inpatient appropriate because:Ongoing active pain requiring inpatient pain management, IV treatments appropriate due to intensity of illness or inability to take PO and Inpatient level of care appropriate due to severity of illness   Dispo: The patient is from: Home              Anticipated d/c is to: Home              Patient currently is not medically stable to d/c.   Difficult to place patient No      Risk of unplanned readmission score: Unplanned Admission- Pilot do not use: 7.27   Objective: Blood pressure (!) 154/80, pulse 79, temperature 97.7 F (36.5 C), temperature source Oral, resp. rate 16, height 5\' 9"  (1.753 m), weight 84.7 kg, SpO2 100 %.  Examination: General appearance: alert, cooperative and fatigued Head: Normocephalic, without obvious abnormality, atraumatic Eyes: EOMI Lungs: clear to auscultation bilaterally Heart: regular rate and rhythm and S1, S2 normal Abdomen: TTP in LUQ. BS present Extremities: no edema Skin: mobility and turgor normal Neurologic: Grossly normal  Consultants:  Oncology  GI  IR  Procedures:   EUS, 06/18/20  Data Reviewed: I have personally reviewed following labs and imaging studies Results for orders placed or performed during the hospital encounter of 06/14/20 (from the past 24 hour(s))  Glucose, capillary     Status: Abnormal    Collection Time: 06/19/20  4:40 PM  Result Value Ref Range   Glucose-Capillary 151 (H) 70 - 99 mg/dL  Glucose, capillary     Status: Abnormal   Collection Time: 06/19/20  8:37 PM  Result Value Ref Range   Glucose-Capillary 218 (H) 70 - 99 mg/dL  Glucose, capillary     Status: None   Collection Time: 06/19/20 11:58 PM  Result Value Ref Range   Glucose-Capillary 93 70 - 99 mg/dL  Glucose, capillary     Status: Abnormal   Collection Time: 06/20/20  4:55 AM  Result Value Ref Range   Glucose-Capillary 126 (H) 70 - 99 mg/dL  Glucose, capillary     Status: Abnormal   Collection Time: 06/20/20  8:01 AM  Result Value Ref Range   Glucose-Capillary 171 (H) 70 - 99 mg/dL  Glucose, capillary     Status: Abnormal   Collection Time: 06/20/20 11:23 AM  Result Value Ref Range   Glucose-Capillary 167 (H) 70 - 99 mg/dL    Recent Results (from the past 240 hour(s))  SARS CORONAVIRUS 2 (TAT 6-24 HRS) Nasopharyngeal Nasopharyngeal Swab     Status: None   Collection Time: 06/15/20 12:09 AM   Specimen: Nasopharyngeal Swab  Result Value Ref Range Status   SARS Coronavirus 2 NEGATIVE NEGATIVE Final    Comment: (NOTE) SARS-CoV-2 target nucleic acids are NOT DETECTED.  The SARS-CoV-2 RNA is generally detectable in upper and lower respiratory specimens during the acute phase of infection. Negative results do not preclude SARS-CoV-2 infection, do not rule out co-infections with other pathogens, and should not be used as the sole basis for treatment or other patient management decisions. Negative results must be combined with clinical observations, patient history, and epidemiological information. The expected result is Negative.  Fact Sheet for Patients: SugarRoll.be  Fact Sheet for Healthcare Providers: https://www.woods-mathews.com/  This test is not yet approved or cleared by the [place] FDA and  has been authorized for detection and/or diagnosis  of SARS-CoV-2 by FDA under an Emergency Use Authorization (EUA). This EUA will remain  in effect (meaning this test can be used) for the duration of the COVID-19 declaration under Se ction 564(b)(1) of the Act, 21 U.S.C. section 360bbb-3(b)(1), unless the authorization is terminated or revoked sooner.  Performed at [facility], [place] [address], [place], [place] [address]      Radiology Studies: No results found. CT CHEST WO CONTRAST  Final Result    MR ABDOMEN MRCP W WO CONTAST  Final Result    CT ABDOMEN PELVIS W CONTRAST  Final Result    IR IMAGING GUIDED PORT INSERTION    (Results Pending)    Scheduled Meds: . amLODipine  10 mg Oral Daily  . cloNIDine  0.2 mg Oral QHS  . indomethacin  100 mg Rectal Once  . insulin aspart  0-15 Units Subcutaneous Q4H  . insulin glargine  7 Units Subcutaneous Q2200  . levothyroxine  137 mcg Oral Q0600  . morphine  15 mg Oral Q12H  . polyethylene glycol  17 g Oral BID  . senna-docusate  1 tablet Oral BID   PRN Meds: acetaminophen **OR** acetaminophen, hydrALAZINE, HYDROmorphone (DILAUDID) injection, ondansetron **  OR** ondansetron (ZOFRAN) IV, oxyCODONE, zolpidem Continuous Infusions: . lactated ringers 10 mL/hr at 06/18/20 1839     LOS: 5 days  Time spent: Greater than 50% of the 35 minute visit was spent in counseling/coordination of care for the patient as laid out in the A&P.   Dwyane Dee, MD [facility] 06/20/2020, 1:51 PM

## 2020-06-20 NOTE — Consult Note (Signed)
Chief Complaint: Patient was seen in consultation today for [facility] a cath placement Chief Complaint  Patient presents with  . Flank Pain  . Chest Pain   at the request of Dr Chryl Heck   Supervising Physician: Jacqulynn Cadet  Patient Status: [facility] - In-pt  History of Present Illness: Timothy Page is a 64 y.o. male   DM; HTN Worsening abd pain Significant wt loss-- pain with eating for few months Initial work up revealing elevated lipase--- pancreatitis on CT Further work up with GI MD: EUS 06/18/20:  Pancreatic adenocarcinoma       Staging with MRCP and CT chest shows probable mets in liver, lung, and possibly peritoneum.  He has been evaluated by oncology with plans for outpatient chemo after discharge.  MR: IMPRESSION: 1. There is a hypoenhancing lesion in the superior pancreatic head and neck measuring approximately 3.2 x 2.4 cm. 2. Distal to this lesion, there is gross dilatation of the pancreatic duct, measuring up to 9 mm in caliber. The duct is not visualized through the pancreatic head, but is reconstituted near the ampulla. 3. The central superior mesenteric vein and portal vein are abruptly effaced or occluded within the pancreatic head. 4. Constellation of findings is highly suspicious for pancreatic adenocarcinoma. 5. There is a rim enhancing lesion of the right lobe of the liver abutting the gallbladder fossa, hepatic segment V, measuring 1.2 x 1.1 cm. Although difficult to definitively characterize due to small size, there are no clearly benign contrast enhancement characteristics and this is highly suspicious for metastatic disease. 6. Diffuse inflammatory fat stranding about the pancreas, consistent with acute pancreatitis. Findings within the pancreatic head head and neck could reflect phlegmonous change related to pancreatitis, however fullness of the pancreatic head neck has substantially increased when compared to more remote prior dated 11/22/2019  and adenocarcinoma is the diagnosis of exclusion   PAC has been ordered as IP. Spoke to Dr Chryl Heck-- really wants placed as IP to expedite care and treatment.  Plan for likely Mon or Tue placement   Past Medical History:  Diagnosis Date  . Arthritis   . Atypical chest pain 04/2017   ACS ruled out 05/11/17  . CAP (community acquired pneumonia) 05/07/2017  . Diabetes mellitus without complication ([place])    Poor control starting fall 2021-->GAD ab neg.  Insulin and C peptide levels wnl but on lower end of normal->Dr Kuma/endo 2022.  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 Hx of adenomatous polyp of colon 07/02/2009   rpt colonoscopy 2026  . Hyperlipidemia    Intol of multiple statins and zetia. [facility] 2022.  [name] Hypertension   . Lipoma    back  . Myocardial infarction ([place])    MI at age 70  . Pancreatic mass 06/2020   w/acute pancreatitis  . Perianal abscess 11/2019   I&D'd by Dr.Thompson, gen surg, in the ED 11/23/19  . 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 Cammy Copa  . Right ureteral calculus 04/2018   Cystoscopy with right retrograde pyelogram and right ureteral stent placement after stone removal.  . Seasonal allergies   . Stroke ([place])    in 10R no complications   . Tachyarrhythmia 1980   age 55 X 2 over 20 month period.  No recurrence since that time.  . Thoracic aortic aneurysm ([place])    Incidentally discovered, 4.1 cm-->Dr. Cyndia Bent. Rpt CT 08/2018 and 08/2019 stable.  . Vocal cord  paralysis 07/2016   Left vocal cord paralysis noted after thyroid surgery.    Past Surgical History:  Procedure Laterality Date  . BIOPSY  06/18/2020   Procedure: BIOPSY;  Surgeon: Rush Landmark Telford Nab., MD;  Location: [place];  Service: Gastroenterology;;  . [place]  . COLONOSCOPY W/ POLYPECTOMY  07/02/2009; 01/24/20   2011 adenoma.  2021 adenomas x 3->recall 2026  . CYSTOSCOPY  WITH RETROGRADE PYELOGRAM, URETEROSCOPY AND STENT PLACEMENT Right 05/09/2018   Procedure: CYSTOSCOPY WITH RIGHT RETROGRADE RIGHT URETEROSCOPY STONE EXTRACTION RIGHT STENT EXCHANGE;  Surgeon: Lucas Mallow, MD;  Location: [facility];  Service: Urology;  Laterality: Right;  . CYSTOSCOPY/RETROGRADE/URETEROSCOPY/STONE EXTRACTION WITH BASKET Right 04/30/2018   Procedure: CYSTOSCOPY/RETROGRADE/RIGHT URETEROSCOPY/;  Surgeon: Lucas Mallow, MD;  Location: [facility];  Service: Urology;  Laterality: Right;  . ESOPHAGOGASTRODUODENOSCOPY  07/02/2009   NORMAL  . ESOPHAGOGASTRODUODENOSCOPY (EGD) WITH PROPOFOL N/A 06/18/2020   Procedure: ESOPHAGOGASTRODUODENOSCOPY (EGD) WITH PROPOFOL;  Surgeon: Rush Landmark Telford Nab., MD;  Location: [place];  Service: Gastroenterology;  Laterality: N/A;  . EYE SURGERY    . FINE NEEDLE ASPIRATION  06/18/2020   Procedure: FINE NEEDLE ASPIRATION (FNA) LINEAR;  Surgeon: Irving Copas., MD;  Location: [place];  Service: Gastroenterology;;  . finger amputaion     with reattachement  . INCISION AND DRAINAGE ABSCESS ANAL  11/23/2019   gen surg (In ED).  [name] LASIK    . THYROIDECTOMY N/A 08/03/2016   Procedure: Total THYROIDECTOMY;  Surgeon: [name], MD;  Location: [place];  Service: ENT;  Laterality: N/A;  Total Thyroidectomy   . UPPER ESOPHAGEAL ENDOSCOPIC ULTRASOUND (EUS) N/A 06/18/2020   Procedure: UPPER ESOPHAGEAL ENDOSCOPIC ULTRASOUND (EUS);  Surgeon: Irving Copas., MD;  Location: [place];  Service: Gastroenterology;  Laterality: N/A;  . UPPER GASTROINTESTINAL ENDOSCOPY     with EUS and bx of pancreatic mass. +appearance of candida in esoph.      Allergies: Atenolol, Hydroxyzine, Tetracycline, Atorvastatin, Codeine, Guaifenesin, and Mucinex [guaifenesin er]  Medications: Prior to Admission medications   Medication Sig Start Date End Date Taking? Authorizing Provider  amLODipine (NORVASC) 10 MG tablet TAKE 1 TABLET BY MOUTH  DAILY Patient  taking differently: Take 10 mg by mouth daily. 05/25/20  Yes McGowen, Adrian Blackwater, MD  cloNIDine (CATAPRES) 0.3 MG tablet TAKE 1 TABLET BY MOUTH EVERY NIGHT AT BEDTIME FOR HYPERTENSION Patient taking differently: Take 0.3 mg by mouth daily. 06/08/20  Yes McGowen, Adrian Blackwater, MD  insulin glargine (LANTUS SOLOSTAR) 100 UNIT/ML Solostar Pen Give up to 65 units SQ qhs. Patient will be up-titrating this med dose as needed to get glucoses into normal range. Patient taking differently: Inject 20 Units into the skin at bedtime. 06/08/20  Yes McGowen, Adrian Blackwater, MD  insulin lispro (HUMALOG KWIKPEN) 100 UNIT/ML KwikPen upto 12 units before meals tid Patient taking differently: Inject 0-8 Units into the skin 3 (three) times daily. Per sliding scale 05/18/20  Yes Elayne Snare, MD  levothyroxine (SYNTHROID) 137 MCG tablet TAKE 1 TABLET BY MOUTH  DAILY BEFORE BREAKFAST Patient taking differently: Take 137 mcg by mouth daily before breakfast. 06/03/20  Yes Elayne Snare, MD  psyllium (METAMUCIL SMOOTH TEXTURE) 28 % packet Take 1 packet by mouth daily.   Yes [provider]  ramipril (ALTACE) 10 MG capsule TAKE 1 CAPSULE BY MOUTH  TWICE DAILY Patient taking differently: Take 10 mg by mouth 2 (two) times daily. 01/16/20  Yes McGowen, Adrian Blackwater, MD  Blood Glucose Monitoring Suppl (ONE  TOUCH ULTRA 2) w/Device KIT USE TO CHECK BLOOD SUGAR  ONCE DAILY 12/10/19   McGowen, Adrian Blackwater, MD  Continuous Blood Gluc Sensor (FREESTYLE LIBRE 2 SENSOR) MISC 2 Devices by Does not apply route every 14 (fourteen) days. 04/03/20   Elayne Snare, MD  empagliflozin (JARDIANCE) 25 MG TABS tablet Take 1 tablet (25 mg total) by mouth daily before breakfast. Patient not taking: Reported on 06/15/2020 04/03/20   Elayne Snare, MD  Evolocumab (REPATHA SURECLICK) 332 MG/ML SOAJ Inject 1 Dose into the skin every 14 (fourteen) days. Patient not taking: Reported on 06/15/2020 05/14/20   Pixie Casino, MD  glucose blood (ONETOUCH ULTRA) test strip USE TO CHECK  BLOOD SUGAR  ONCE DAILY Patient taking differently: USE TO CHECK BLOOD SUGAR  ONCE DAILY 12/03/19   McGowen, Adrian Blackwater, MD  insulin aspart (NOVOLOG FLEXPEN) 100 UNIT/ML FlexPen upto 12 units before meals tid Patient not taking: Reported on 06/15/2020 04/24/20   Elayne Snare, MD  Insulin Pen Needle 31G X 5 MM MISC Use with insulin pen 3 times a day 04/24/20   Elayne Snare, MD  Lancets [facility] DELICA PLUS [id number]) [place] 07/16/19   McGowen, Adrian Blackwater, MD     Family History  Problem Relation Age of Onset  . Diabetes Mother   . Cancer Mother        lung  . COPD Mother   . Brain cancer Mother   . Arthritis Mother   . Hyperlipidemia Mother   . Heart disease Mother   . Hypertension Mother   . Diabetes Father   . Cancer Father        mesothelioma; skin  . Mental illness Father   . Arthritis Father   . Hyperlipidemia Father   . Heart disease Father   . Hypertension Father   . Heart disease Maternal Grandmother        MI @ 32  . Heart disease Maternal Grandfather        MI @ 45  . Cancer Paternal Grandmother   . Diabetes Paternal Grandmother   . Heart disease Paternal Grandfather        MI @ 24  . Thyroid cancer Maternal Aunt   . Colon cancer Neg Hx   . Esophageal cancer Neg Hx   . Rectal cancer Neg Hx   . Stomach cancer Neg Hx     Social History   Socioeconomic History  . Marital status: Married    Spouse name: Not on file  . Number of children: Not on file  . Years of education: Not on file  . Highest education level: Not on file  Occupational History  . Not on file  Tobacco Use  . Smoking status: Never Smoker  . Smokeless tobacco: Current User    Types: Snuff  Vaping Use  . Vaping Use: Never used  Substance and Sexual Activity  . Alcohol use: No  . Drug use: No  . Sexual activity: Not on file  Other Topics Concern  . Not on file  Social History Narrative   Married, 2 daughters, 1 grandson that lives with him.   Educ: some college    Occup: retired from the [employer].  Retired at 65 but returned to work at some point, now with grandson living with him.   No T/A/Ds.   Social Determinants of Health   Financial Resource Strain: Not on file  Food Insecurity: Not on file  Transportation Needs:  Not on file  Physical Activity: Not on file  Stress: Not on file  Social Connections: Not on file    Review of Systems: A 12 point ROS discussed and pertinent positives are indicated in the HPI above.  All other systems are negative.  Review of Systems  Constitutional: Positive for activity change, appetite change, fatigue and unexpected weight change. Negative for fever.  Respiratory: Negative for cough and shortness of breath.   Gastrointestinal: Positive for abdominal pain and nausea.  Neurological: Positive for weakness.  Psychiatric/Behavioral: Negative for behavioral problems and confusion.    Vital Signs: BP (!) 154/80   Pulse 79   Temp 97.7 F (36.5 C) (Oral)   Resp 16   Ht '5\' 9"'  (1.753 m)   Wt 186 lb 11.7 oz (84.7 kg)   SpO2 100%   BMI 27.58 kg/m   Physical Exam Vitals reviewed.  Cardiovascular:     Rate and Rhythm: Normal rate and regular rhythm.     Heart sounds: Normal heart sounds.  Pulmonary:     Effort: Pulmonary effort is normal.  Abdominal:     General: Bowel sounds are normal.     Palpations: Abdomen is soft.  Musculoskeletal:        General: Normal range of motion.  Skin:    General: Skin is warm.  Neurological:     Mental Status: He is alert and oriented to person, place, and time.  Psychiatric:        Behavior: Behavior normal.     Imaging: CT CHEST WO CONTRAST  Result Date: 06/18/2020 CLINICAL DATA:  65 year old male with history of pancreatic cancer. Staging examination. EXAM: CT CHEST WITHOUT CONTRAST TECHNIQUE: Multidetector CT imaging of the chest was performed following the standard protocol without IV contrast. COMPARISON:  Chest CT 09/11/2019. FINDINGS:  Cardiovascular: Heart size is normal. There is no significant pericardial fluid, thickening or pericardial calcification. There is aortic atherosclerosis, as well as atherosclerosis of the great vessels of the mediastinum and the coronary arteries, including calcified atherosclerotic plaque in the left main, left anterior descending and right coronary arteries. Mediastinum/Nodes: No pathologically enlarged mediastinal or hilar lymph nodes. Please note that accurate exclusion of hilar adenopathy is limited on noncontrast CT scans. Esophagus is unremarkable in appearance. No axillary lymphadenopathy. Lungs/Pleura: Multiple new pulmonary nodules are noted in the lungs bilaterally, largest of which are in the left lower lobe (axial image 90 of series 4) measuring up to 6 mm. No acute consolidative airspace disease. No pleural effusions. Upper Abdomen: Soft tissue stranding around the pancreas concerning for acute pancreatitis. Previously demonstrated pancreatic mass is not well appreciated on today's noncontrast CT examination (please see recent abdominal MRI 06/15/2020 for full description of these findings). High attenuation material in the gallbladder likely represents vicarious excretion of gadolinium from recent contrast enhanced abdominal MRI. Aortic atherosclerosis. Musculoskeletal: Flowing anterior osteophytes throughout the thoracic spine, suggesting diffuse idiopathic skeletal hyperostosis (DISH). There are no aggressive appearing lytic or blastic lesions noted in the visualized portions of the skeleton. IMPRESSION: 1. Multiple new pulmonary nodules scattered throughout the lungs bilaterally measuring up to 6 mm in the left lower lobe. These are nonspecific, but the possibility of metastatic disease is not excluded. Close attention on future follow-up studies is recommended to ensure the stability or regression of these findings. 2. Aortic atherosclerosis, in addition to left main and 2 vessel coronary artery  disease. Please note that although the presence of coronary artery calcium documents the presence of coronary artery disease,  the severity of this disease and any potential stenosis cannot be assessed on this non-gated CT examination. Assessment for potential risk factor modification, dietary therapy or pharmacologic therapy may be warranted, if clinically indicated. Aortic Atherosclerosis (ICD10-I70.0). Electronically Signed   By: Vinnie Langton M.D.   On: 06/18/2020 08:44   CT ABDOMEN PELVIS W CONTRAST  Result Date: 06/14/2020 CLINICAL DATA:  Diffuse abdominal pain. Bilateral flank pain for 3 weeks. EXAM: CT ABDOMEN AND PELVIS WITH CONTRAST TECHNIQUE: Multidetector CT imaging of the abdomen and pelvis was performed using the standard protocol following bolus administration of intravenous contrast. CONTRAST:  18m OMNIPAQUE IOHEXOL 300 MG/ML  SOLN COMPARISON:  11/22/2019 FINDINGS: Lower chest: The lung bases are clear. The heart size is normal. Hepatobiliary: The liver is normal. Normal gallbladder.There is no biliary ductal dilation. Pancreas: There are inflammatory changes about the pancreas. There is pancreatic ductal dilatation with a possible abrupt cutoff in the distal pancreatic body. There is fullness of the pancreatic head with a heterogeneous appearance and areas of decreased attenuation. Spleen: Unremarkable. Adrenals/Urinary Tract: --Adrenal glands: Unremarkable. --Right kidney/ureter: No hydronephrosis or radiopaque kidney stones. --Left kidney/ureter: No hydronephrosis or radiopaque kidney stones. --Urinary bladder: Unremarkable. Stomach/Bowel: --Stomach/Duodenum: No hiatal hernia or other gastric abnormality. Normal duodenal course and caliber. --Small bowel: Unremarkable. --Colon: Unremarkable. --Appendix: Normal. Vascular/Lymphatic: Atherosclerotic calcification is present within the non-aneurysmal abdominal aorta, without hemodynamically significant stenosis. --No retroperitoneal  lymphadenopathy. --No mesenteric lymphadenopathy. --No pelvic or inguinal lymphadenopathy. Reproductive: Unremarkable Other: No ascites or free air. The abdominal wall is normal. Musculoskeletal. No acute displaced fractures. IMPRESSION: 1. Abnormal appearance of the pancreas with adjacent fat stranding, pancreatic ductal dilatation, and fullness of the pancreatic head and distal pancreatic body. Findings may be secondary to acute pancreatitis. However, the fullness of the pancreatic head and pancreatic ductal dilatation is suspicious for an underlying obstructing lesion. As such, follow-up with a nonemergent contrast enhanced MRI is recommended for further evaluation of this finding. 2. No other potentially acute abnormality detected in the abdomen or pelvis. Electronically Signed   By: CConstance HolsterM.D.   On: 06/14/2020 20:19   MR ABDOMEN MRCP W WO CONTAST  Result Date: 06/15/2020 CLINICAL DATA:  Abnormal pancreas on prior CT, possible acute pancreatitis versus mass EXAM: MRI ABDOMEN WITHOUT AND WITH CONTRAST (INCLUDING MRCP) TECHNIQUE: Multiplanar multisequence MR imaging of the abdomen was performed both before and after the administration of intravenous contrast. Heavily T2-weighted images of the biliary and pancreatic ducts were obtained, and three-dimensional MRCP images were rendered by post processing. CONTRAST:  8.569mGADAVIST GADOBUTROL 1 MMOL/ML IV SOLN COMPARISON:  CT abdomen pelvis, 06/14/2020, 11/22/2019 FINDINGS: Lower chest: No acute findings. Hepatobiliary: There is a rim enhancing lesion of the right lobe of the liver abutting the gallbladder fossa, hepatic segment V, measuring 1.2 x 1.1 cm (series 2, image 42). No biliary ductal dilatation. Pancreas: Diffuse inflammatory fat stranding about the pancreas. There is a hypoenhancing lesion in the superior pancreatic head and neck measuring approximately 3.2 x 2.4 cm (series 20, image 48). Distal to this lesion, there is gross dilatation of  the pancreatic duct, measuring up to 9 mm in caliber. The duct is not visualized through the pancreatic head, but is reconstituted near the ampulla. The central superior mesenteric vein and portal vein are abruptly effaced or occluded within the pancreatic head. Spleen:  Within normal limits in size and appearance. Adrenals/Urinary Tract: No masses identified. No evidence of hydronephrosis. Stomach/Bowel: Visualized portions within the abdomen are unremarkable. Vascular/Lymphatic: No  pathologically enlarged lymph nodes identified. No abdominal aortic aneurysm demonstrated. Other:  None. Musculoskeletal: No suspicious bone lesions identified. IMPRESSION: 1. There is a hypoenhancing lesion in the superior pancreatic head and neck measuring approximately 3.2 x 2.4 cm. 2. Distal to this lesion, there is gross dilatation of the pancreatic duct, measuring up to 9 mm in caliber. The duct is not visualized through the pancreatic head, but is reconstituted near the ampulla. 3. The central superior mesenteric vein and portal vein are abruptly effaced or occluded within the pancreatic head. 4. Constellation of findings is highly suspicious for pancreatic adenocarcinoma. 5. There is a rim enhancing lesion of the right lobe of the liver abutting the gallbladder fossa, hepatic segment V, measuring 1.2 x 1.1 cm. Although difficult to definitively characterize due to small size, there are no clearly benign contrast enhancement characteristics and this is highly suspicious for metastatic disease. 6. Diffuse inflammatory fat stranding about the pancreas, consistent with acute pancreatitis. Findings within the pancreatic head head and neck could reflect phlegmonous change related to pancreatitis, however fullness of the pancreatic head neck has substantially increased when compared to more remote prior dated 11/22/2019 and adenocarcinoma is the diagnosis of exclusion. Electronically Signed   By: Eddie Candle M.D.   On: 06/15/2020  20:58    Labs:  CBC: Recent Labs    06/14/20 1859 06/15/20 0508 06/16/20 0141 06/17/20 0223  WBC 6.5 6.0 5.9 5.4  HGB 14.0 13.3 12.6* 12.1*  HCT 40.5 38.2* 37.5* 36.4*  PLT 199 170 175 171    COAGS: No results for input(s): INR, APTT in the last 8760 hours.  BMP: Recent Labs    06/14/20 1859 06/15/20 0508 06/16/20 0141 06/17/20 0223  NA 138 139 139 139  K 3.9 3.3* 3.9 3.9  CL 104 104 105 105  CO2 '23 27 26 26  ' GLUCOSE 114* 122* 96 121*  BUN '17 11 9 8  ' CALCIUM 9.7 9.5 9.3 9.2  CREATININE 1.12 1.04 0.99 1.00  GFRNONAA >60 >60 >60 >60    LIVER FUNCTION TESTS: Recent Labs    06/14/20 1859 06/15/20 0508 06/16/20 0141 06/17/20 0223  BILITOT 0.2* 0.9 0.4 0.6  AST 15 13* 11* 11*  ALT '14 13 11 10  ' ALKPHOS 75 76 72 68  PROT 7.3 6.4* 6.1* 5.8*  ALBUMIN 4.1 3.7 3.5 3.3*    TUMOR MARKERS: No results for input(s): AFPTM, CEA, CA199, CHROMGRNA in the last 8760 hours.  Assessment and Plan:  Newly dx pancreatic cancer Scheduled for inpatient port a cath placement to expedite care Risks and benefits of image guided port-a-catheter placement was discussed with the patient including, but not limited to bleeding, infection, pneumothorax, or fibrin sheath development and need for additional procedures.  All of the patient's questions were answered, patient is agreeable to proceed. Consent signed and in chart.   Thank you for this interesting consult.  I greatly enjoyed meeting ARSENIO SCHNORR and look forward to participating in their care.  A copy of this report was sent to the requesting provider on this date.  Electronically Signed: Lavonia Drafts, PA-C 06/20/2020, 2:49 PM   I spent a total of 20 Minutes    in face to face in clinical consultation, greater than 50% of which was counseling/coordinating care for [facility] placement

## 2020-06-21 DIAGNOSIS — K859 Acute pancreatitis without necrosis or infection, unspecified: Secondary | ICD-10-CM | POA: Diagnosis not present

## 2020-06-21 DIAGNOSIS — E119 Type 2 diabetes mellitus without complications: Secondary | ICD-10-CM | POA: Diagnosis not present

## 2020-06-21 DIAGNOSIS — I10 Essential (primary) hypertension: Secondary | ICD-10-CM | POA: Diagnosis not present

## 2020-06-21 LAB — CBC WITH DIFFERENTIAL/PLATELET
Abs Immature Granulocytes: 0.01 10*3/uL (ref 0.00–0.07)
Basophils Absolute: 0.1 10*3/uL (ref 0.0–0.1)
Basophils Relative: 1 %
Eosinophils Absolute: 0.3 10*3/uL (ref 0.0–0.5)
Eosinophils Relative: 4 %
HCT: 41.3 % (ref 39.0–52.0)
Hemoglobin: 14.2 g/dL (ref 13.0–17.0)
Immature Granulocytes: 0 %
Lymphocytes Relative: 33 %
Lymphs Abs: 2 10*3/uL (ref 0.7–4.0)
MCH: 30.8 pg (ref 26.0–34.0)
MCHC: 34.4 g/dL (ref 30.0–36.0)
MCV: 89.6 fL (ref 80.0–100.0)
Monocytes Absolute: 0.5 10*3/uL (ref 0.1–1.0)
Monocytes Relative: 8 %
Neutro Abs: 3.4 10*3/uL (ref 1.7–7.7)
Neutrophils Relative %: 54 %
Platelets: 201 10*3/uL (ref 150–400)
RBC: 4.61 MIL/uL (ref 4.22–5.81)
RDW: 12.1 % (ref 11.5–15.5)
WBC: 6.3 10*3/uL (ref 4.0–10.5)
nRBC: 0 % (ref 0.0–0.2)

## 2020-06-21 LAB — COMPREHENSIVE METABOLIC PANEL
ALT: 13 U/L (ref 0–44)
AST: 14 U/L — ABNORMAL LOW (ref 15–41)
Albumin: 3.5 g/dL (ref 3.5–5.0)
Alkaline Phosphatase: 69 U/L (ref 38–126)
Anion gap: 7 (ref 5–15)
BUN: 6 mg/dL — ABNORMAL LOW (ref 8–23)
CO2: 30 mmol/L (ref 22–32)
Calcium: 9.6 mg/dL (ref 8.9–10.3)
Chloride: 103 mmol/L (ref 98–111)
Creatinine, Ser: 1.15 mg/dL (ref 0.61–1.24)
GFR, Estimated: >60 mL/min (ref >60)
Glucose, Bld: 114 mg/dL — ABNORMAL HIGH (ref 70–99)
Potassium: 4.5 mmol/L (ref 3.5–5.1)
Sodium: 140 mmol/L (ref 135–145)
Total Bilirubin: 0.5 mg/dL (ref 0.3–1.2)
Total Protein: 6.3 g/dL — ABNORMAL LOW (ref 6.5–8.1)

## 2020-06-21 LAB — GLUCOSE, CAPILLARY
Glucose-Capillary: 128 mg/dL — ABNORMAL HIGH (ref 70–99)
Glucose-Capillary: 198 mg/dL — ABNORMAL HIGH (ref 70–99)
Glucose-Capillary: 162 mg/dL — ABNORMAL HIGH (ref 70–99)
Glucose-Capillary: 172 mg/dL — ABNORMAL HIGH (ref 70–99)
Glucose-Capillary: 194 mg/dL — ABNORMAL HIGH (ref 70–99)

## 2020-06-21 LAB — MAGNESIUM: Magnesium: 2.3 mg/dL (ref 1.7–2.4)

## 2020-06-21 MED ORDER — POLYETHYLENE GLYCOL 3350 17 G PO PACK
17.0000 g | PACK | Freq: Three times a day (TID) | ORAL | Status: DC
  Administered 2020-06-21 – 2020-06-22 (×2): 17 g via ORAL
  Filled 2020-06-21 (×3): qty 1

## 2020-06-21 NOTE — Progress Notes (Addendum)
[facility] PROGRESS NOTE    Progress Note  Timothy Page  YIR:[id number] DOB: February 19, 1955 DOA: 06/14/2020 PCP: Tammi Sou, MD     Brief Narrative:   Timothy Page is an 65 y.o. male past medical history of type 2 diabetes mellitus, essential hypertension comes in for abdominal pain was diagnosed with acute pancreatitis.  CT scan confirms it showed normal-appearing fat adjacent stranding.  Significant studies: MRCP showed a 3.2 x 2.4 cm pancreatic head lesion. enhancing lesion of the right lobe of the liver abutting the gallbladder fossa, hepatic segment V, measuring 1.2 x 1.1 cm. Assessment/Plan:   Pancreatic adenocarcinoma stage IV/acute pancreatitis: Initially came in with acute pancreatitis treated conservatively with IV narcotics and IV fluids. Status post EUS on 06/18/2020 needle biopsy consistent with adenocarcinoma. FOLFIRINOX recommended by oncology. His pain is not controlled we will start her on Oxy for breakthrough. schedule for port-cath placement either Monday or Tuesday Having regular bowel movements.  Hypothyroidism: Continue Synthroid.  Essential hypertension: ACE inhibitor was held on admission continue amlodipine and clonidine.  Controlled type 2 diabetes mellitus without complication, without long-term current use of insulin (HCC)with hyperglycemia: Continue long-acting insulin per sliding scale for glucose was fairly well controlled  History of CVA: Continue aspirin.    DVT prophylaxis: lovenox Family Communication:none Status is: Inpatient  Remains inpatient appropriate because:Hemodynamically unstable   Dispo: The patient is from: Home              Anticipated d/c is to: Home              Patient currently is not medically stable to d/c.   Difficult to place patient No        Code Status:     Code Status Orders  (From admission, onward)         Start     Ordered   06/15/20 0318  Full code  Continuous         06/15/20 0318        Code Status History    Date Active Date Inactive Code Status Order ID Comments User Context   05/11/2017 0346 05/13/2017 1500 Full Code [id number]  Vianne Bulls, MD ED   05/07/2017 1456 05/09/2017 1330 Full Code [id number]  Velna Hatchet, MD Inpatient   07/31/2016 1646 08/05/2016 1944 Full Code [id number]  [facility]   Advance Care Planning Activity        IV Access:    Peripheral IV   Procedures and diagnostic studies:   No results found.   Medical Consultants:    None.   Subjective:    Timothy Page relates his pain is controlled, bugdown with news  Objective:    Vitals:   06/20/20 1114 06/20/20 1714 06/20/20 2046 06/21/20 0412  BP: (!) 154/80 128/80 (!) 114/91 125/84  Pulse: 79 69 81 72  Resp: 16 16 18 17   Temp: 97.7 F (36.5 C) 97.9 F (36.6 C) 98 F (36.7 C) 98.4 F (36.9 C)  TempSrc: Oral Oral Oral Oral  SpO2: 100% 97% 98% 98%  Weight:      Height:       SpO2: 98 % O2 Flow Rate (L/min): 2 L/min   Intake/Output Summary (Last 24 hours) at 06/21/2020 0747 Last data filed at 06/20/2020 1900 Gross per 24 hour  Intake 1614 ml  Output --  Net 1614 ml   Filed Weights   06/14/20 1818 06/15/20 0257  Weight: 87.5 kg 84.7 kg  Exam: General exam: In no acute distress. Respiratory system: Good air movement and clear to auscultation. Cardiovascular system: S1 & S2 heard, RRR. No JVD. Gastrointestinal system: Abdomen is nondistended, soft and nontender.  Extremities: No pedal edema. Skin: No rashes, lesions or ulcers Psychiatry: Judgement and insight appear normal. Mood & affect appropriate.   Data Reviewed:    Labs: Basic Metabolic Panel: Recent Labs  Lab 06/14/20 1859 06/15/20 0508 06/16/20 0141 06/17/20 0223 06/21/20 0324  NA 138 139 139 139 140  K 3.9 3.3* 3.9 3.9 4.5  CL 104 104 105 105 103  CO2 23 27 26 26 30   GLUCOSE 114* 122* 96 121* 114*  BUN 17 11 9 8  6*  CREATININE 1.12 1.04 0.99 1.00  1.15  CALCIUM 9.7 9.5 9.3 9.2 9.6  MG  --  1.9  --   --  2.3   GFR Estimated Creatinine Clearance: 65.7 mL/min (by C-G formula based on SCr of 1.15 mg/dL). Liver Function Tests: Recent Labs  Lab 06/14/20 1859 06/15/20 0508 06/16/20 0141 06/17/20 0223 06/21/20 0324  AST 15 13* 11* 11* 14*  ALT 14 13 11 10 13   ALKPHOS 75 76 72 68 69  BILITOT 0.2* 0.9 0.4 0.6 0.5  PROT 7.3 6.4* 6.1* 5.8* 6.3*  ALBUMIN 4.1 3.7 3.5 3.3* 3.5   Recent Labs  Lab 06/14/20 1859  LIPASE 66*   No results for input(s): AMMONIA in the last 168 hours. Coagulation profile No results for input(s): INR, PROTIME in the last 168 hours. COVID-19 Labs  No results for input(s): DDIMER, FERRITIN, LDH, CRP in the last 72 hours.  Lab Results  Component Value Date   SARSCOV2NAA NEGATIVE 06/15/2020   SARSCOV2NAA RESULT: NEGATIVE 01/22/2020    CBC: Recent Labs  Lab 06/14/20 1859 06/15/20 0508 06/16/20 0141 06/17/20 0223 06/21/20 0324  WBC 6.5 6.0 5.9 5.4 6.3  NEUTROABS 4.5  --   --   --  3.4  HGB 14.0 13.3 12.6* 12.1* 14.2  HCT 40.5 38.2* 37.5* 36.4* 41.3  MCV 88.6 88.4 91.0 90.1 89.6  PLT 199 170 175 171 201   Cardiac Enzymes: No results for input(s): CKTOTAL, CKMB, CKMBINDEX, TROPONINI in the last 168 hours. BNP (last 3 results) No results for input(s): PROBNP in the last 8760 hours. CBG: Recent Labs  Lab 06/20/20 1123 06/20/20 1645 06/20/20 2050 06/20/20 2354 06/21/20 0400  GLUCAP 167* 159* 195* 152* 128*   D-Dimer: No results for input(s): DDIMER in the last 72 hours. Hgb A1c: No results for input(s): HGBA1C in the last 72 hours. Lipid Profile: No results for input(s): CHOL, HDL, LDLCALC, TRIG, CHOLHDL, LDLDIRECT in the last 72 hours. Thyroid function studies: No results for input(s): TSH, T4TOTAL, T3FREE, THYROIDAB in the last 72 hours.  Invalid input(s): FREET3 Anemia work up: No results for input(s): VITAMINB12, FOLATE, FERRITIN, TIBC, IRON, RETICCTPCT in the last 72  hours. Sepsis Labs: Recent Labs  Lab 06/14/20 1859 06/15/20 0508 06/16/20 0141 06/17/20 0223 06/21/20 0324  WBC 6.5 6.0 5.9 5.4 6.3  LATICACIDVEN 1.0  --   --   --   --    Microbiology Recent Results (from the past 240 hour(s))  SARS CORONAVIRUS 2 (TAT 6-24 HRS) Nasopharyngeal Nasopharyngeal Swab     Status: None   Collection Time: 06/15/20 12:09 AM   Specimen: Nasopharyngeal Swab  Result Value Ref Range Status   SARS Coronavirus 2 NEGATIVE NEGATIVE Final    Comment: (NOTE) SARS-CoV-2 target nucleic acids are NOT DETECTED.  The SARS-CoV-2  RNA is generally detectable in upper and lower respiratory specimens during the acute phase of infection. Negative results do not preclude SARS-CoV-2 infection, do not rule out co-infections with other pathogens, and should not be used as the sole basis for treatment or other patient management decisions. Negative results must be combined with clinical observations, patient history, and epidemiological information. The expected result is Negative.  Fact Sheet for Patients: SugarRoll.be  Fact Sheet for Healthcare Providers: https://www.woods-mathews.com/  This test is not yet approved or cleared by the [place] FDA and  has been authorized for detection and/or diagnosis of SARS-CoV-2 by FDA under an Emergency Use Authorization (EUA). This EUA will remain  in effect (meaning this test can be used) for the duration of the COVID-19 declaration under Se ction 564(b)(1) of the Act, 21 U.S.C. section 360bbb-3(b)(1), unless the authorization is terminated or revoked sooner.  Performed at [facility], [place] [address] [place], [place] [address]      Medications:   . amLODipine  10 mg Oral Daily  . cloNIDine  0.2 mg Oral QHS  . indomethacin  100 mg Rectal Once  . insulin aspart  0-15 Units Subcutaneous Q4H  . insulin glargine  7 Units Subcutaneous Q2200  . levothyroxine  137 mcg Oral  Q0600  . morphine  15 mg Oral Q12H  . polyethylene glycol  17 g Oral BID  . senna-docusate  1 tablet Oral BID   Continuous Infusions:     LOS: 6 days   Charlynne Cousins  [facility]  06/21/2020, 7:47 AM

## 2020-06-22 ENCOUNTER — Other Ambulatory Visit (HOSPITAL_COMMUNITY): Payer: Self-pay

## 2020-06-22 ENCOUNTER — Inpatient Hospital Stay (HOSPITAL_COMMUNITY): Payer: 59

## 2020-06-22 ENCOUNTER — Telehealth: Payer: Self-pay | Admitting: Hematology and Oncology

## 2020-06-22 DIAGNOSIS — C259 Malignant neoplasm of pancreas, unspecified: Principal | ICD-10-CM

## 2020-06-22 DIAGNOSIS — E119 Type 2 diabetes mellitus without complications: Secondary | ICD-10-CM | POA: Diagnosis not present

## 2020-06-22 DIAGNOSIS — E040 Nontoxic diffuse goiter: Secondary | ICD-10-CM | POA: Diagnosis not present

## 2020-06-22 DIAGNOSIS — K859 Acute pancreatitis without necrosis or infection, unspecified: Secondary | ICD-10-CM | POA: Diagnosis not present

## 2020-06-22 HISTORY — PX: IR IMAGING GUIDED PORT INSERTION: IMG5740

## 2020-06-22 LAB — CBC WITH DIFFERENTIAL/PLATELET
Abs Immature Granulocytes: 0.01 10*3/uL (ref 0.00–0.07)
Basophils Absolute: 0.1 10*3/uL (ref 0.0–0.1)
Basophils Relative: 1 %
Eosinophils Absolute: 0.2 10*3/uL (ref 0.0–0.5)
Eosinophils Relative: 3 %
HCT: 38.7 % — ABNORMAL LOW (ref 39.0–52.0)
Hemoglobin: 12.9 g/dL — ABNORMAL LOW (ref 13.0–17.0)
Immature Granulocytes: 0 %
Lymphocytes Relative: 30 %
Lymphs Abs: 2 10*3/uL (ref 0.7–4.0)
MCH: 30.5 pg (ref 26.0–34.0)
MCHC: 33.3 g/dL (ref 30.0–36.0)
MCV: 91.5 fL (ref 80.0–100.0)
Monocytes Absolute: 0.5 10*3/uL (ref 0.1–1.0)
Monocytes Relative: 7 %
Neutro Abs: 4 10*3/uL (ref 1.7–7.7)
Neutrophils Relative %: 59 %
Platelets: 203 10*3/uL (ref 150–400)
RBC: 4.23 MIL/uL (ref 4.22–5.81)
RDW: 12.2 % (ref 11.5–15.5)
WBC: 6.8 10*3/uL (ref 4.0–10.5)
nRBC: 0 % (ref 0.0–0.2)

## 2020-06-22 LAB — COMPREHENSIVE METABOLIC PANEL
ALT: 14 U/L (ref 0–44)
AST: 10 U/L — ABNORMAL LOW (ref 15–41)
Albumin: 3.3 g/dL — ABNORMAL LOW (ref 3.5–5.0)
Alkaline Phosphatase: 69 U/L (ref 38–126)
Anion gap: 6 (ref 5–15)
BUN: 8 mg/dL (ref 8–23)
CO2: 27 mmol/L (ref 22–32)
Calcium: 9.1 mg/dL (ref 8.9–10.3)
Chloride: 105 mmol/L (ref 98–111)
Creatinine, Ser: 1.14 mg/dL (ref 0.61–1.24)
GFR, Estimated: >60 mL/min (ref >60)
Glucose, Bld: 120 mg/dL — ABNORMAL HIGH (ref 70–99)
Potassium: 3.9 mmol/L (ref 3.5–5.1)
Sodium: 138 mmol/L (ref 135–145)
Total Bilirubin: 0.3 mg/dL (ref 0.3–1.2)
Total Protein: 6 g/dL — ABNORMAL LOW (ref 6.5–8.1)

## 2020-06-22 LAB — MAGNESIUM: Magnesium: 2.4 mg/dL (ref 1.7–2.4)

## 2020-06-22 LAB — GLUCOSE, CAPILLARY: Glucose-Capillary: 124 mg/dL — ABNORMAL HIGH (ref 70–99)

## 2020-06-22 MED ORDER — FENTANYL CITRATE (PF) 100 MCG/2ML IJ SOLN
INTRAMUSCULAR | Status: AC | PRN
  Administered 2020-06-22 (×2): 25 ug via INTRAVENOUS

## 2020-06-22 MED ORDER — OXYCODONE HCL 5 MG PO TABS
5.0000 mg | ORAL_TABLET | ORAL | 0 refills | Status: DC | PRN
  Filled 2020-06-22: qty 30, 5d supply, fill #0

## 2020-06-22 MED ORDER — LIDOCAINE-EPINEPHRINE 2 %-1:100000 IJ SOLN
INTRAMUSCULAR | Status: AC | PRN
  Administered 2020-06-22: 30 mL

## 2020-06-22 MED ORDER — HEPARIN SOD (PORK) LOCK FLUSH 100 UNIT/ML IV SOLN
INTRAVENOUS | Status: AC
  Filled 2020-06-22: qty 5

## 2020-06-22 MED ORDER — FENTANYL CITRATE (PF) 100 MCG/2ML IJ SOLN
INTRAMUSCULAR | Status: AC
  Filled 2020-06-22: qty 2

## 2020-06-22 MED ORDER — NICOTINE 21 MG/24HR TD PT24
21.0000 mg | MEDICATED_PATCH | Freq: Every day | TRANSDERMAL | 0 refills | Status: DC
  Filled 2020-06-22: qty 28, 28d supply, fill #0

## 2020-06-22 MED ORDER — HEPARIN SOD (PORK) LOCK FLUSH 100 UNIT/ML IV SOLN
INTRAVENOUS | Status: AC | PRN
  Administered 2020-06-22: 500 [IU] via INTRAVENOUS

## 2020-06-22 MED ORDER — MORPHINE SULFATE ER 15 MG PO TBCR
15.0000 mg | EXTENDED_RELEASE_TABLET | Freq: Two times a day (BID) | ORAL | 0 refills | Status: DC
  Filled 2020-06-22: qty 30, 15d supply, fill #0

## 2020-06-22 MED ORDER — POLYETHYLENE GLYCOL 3350 17 GM/SCOOP PO POWD
17.0000 g | Freq: Three times a day (TID) | ORAL | 0 refills | Status: DC
  Filled 2020-06-22: qty 510, 10d supply, fill #0

## 2020-06-22 MED ORDER — NICOTINE 21 MG/24HR TD PT24
21.0000 mg | MEDICATED_PATCH | Freq: Every day | TRANSDERMAL | Status: DC
  Administered 2020-06-22: 21 mg via TRANSDERMAL
  Filled 2020-06-22: qty 1

## 2020-06-22 MED ORDER — LIDOCAINE-EPINEPHRINE 1 %-1:100000 IJ SOLN
INTRAMUSCULAR | Status: AC
  Filled 2020-06-22: qty 1

## 2020-06-22 MED ORDER — MIDAZOLAM HCL 2 MG/2ML IJ SOLN
INTRAMUSCULAR | Status: AC | PRN
  Administered 2020-06-22: 0.5 mg via INTRAVENOUS
  Administered 2020-06-22: 1 mg via INTRAVENOUS

## 2020-06-22 MED ORDER — MIDAZOLAM HCL 2 MG/2ML IJ SOLN
INTRAMUSCULAR | Status: AC
  Filled 2020-06-22: qty 2

## 2020-06-22 MED ORDER — LANTUS SOLOSTAR 100 UNIT/ML ~~LOC~~ SOPN
PEN_INJECTOR | SUBCUTANEOUS | 1 refills | Status: AC
  Filled 2020-06-22: qty 3, 30d supply, fill #0

## 2020-06-22 NOTE — Telephone Encounter (Signed)
Scheduled appts per 5/7 sch msg. Pt aware of education class and genetic counseling appts. I told pt we are still working on scheduling tx. We will call him again once tx has been scheduled. Pt voiced his understanding.

## 2020-06-22 NOTE — Progress Notes (Signed)
Email sent to Timothy Page at [facility] pathology requesting MSI testing on Case:  [id number].

## 2020-06-22 NOTE — Plan of Care (Signed)
  Problem: Education: Goal: Knowledge of Pancreatitis treatment and prevention will improve Outcome: Progressing   

## 2020-06-22 NOTE — Discharge Summary (Addendum)
Physician Discharge Summary  Timothy Page QHU:[id number] DOB: Jun 24, 1955 DOA: 06/14/2020  PCP: Tammi Sou, MD  Admit date: 06/14/2020 Discharge date: 06/22/2020  Admitted From: Home Disposition:  Home  Recommendations for Outpatient Follow-up:  1. Follow up with PCP in 1-2 weeks 2. Please obtain BMP/CBC in one week 3. He will follow-up with oncology as an outpatient start chemotherapy. 4. Palliative care to meet with family at the house to discuss goals of care.  Home Health:No Equipment/Devices:none  Discharge Condition:Guarded CODE STATUS:Full Diet recommendation: Heart Healthy  Brief/Interim Summary: 65 y.o. male past medical history of type 2 diabetes mellitus, essential hypertension comes in for abdominal pain was diagnosed with acute pancreatitis.  CT scan confirms it showed normal-appearing fat adjacent stranding.  Significant studies: MRCP showed a 3.2 x 2.4 cm pancreatic head lesion. enhancing lesion of the right lobe of the liver abutting the gallbladder fossa, hepatic segment V, measuring 1.2 x 1.1 cm.  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 MRCP showed a lesion in the pancreatic head and in the liver,. GI was consulted recommended a EUS done on 06/18/2020 needle biopsy was consistent with adenocarcinoma. Oncology was consulted recommended to place a Port-A-Cath in house and follow-up with them as  an outpatient to start chemotherapy. He was started on long-acting narcotics and short acting to control his pain which eventually his pain subsided. He was started on MiraLAX and having regular bowel movements.  Hypothyroidism: Continue Synthroid.  Central hypertension: Is medications were changed hydrochlorothiazide was discontinued ACE inhibitor was continue and he was started on Lasix as well as clonidine at bedtime his blood pressure remain fairly controlled.  Diabetes mellitus type 2 without complications: He will continue his long-acting insulin per sliding scale.  History of CVA: Continue aspirin.  Nicotine use: Big tobacco chewer he was started on a nicotine patch.  Discharge Instructions  Discharge Instructions    Diet - low sodium heart healthy   Complete by: As directed    Increase activity slowly   Complete by: As directed      Allergies as of 06/22/2020      Reactions   Atenolol    Severe "slowing"of functioning Severe "slowing"of functioning   Hydroxyzine Other (See Comments)   "felt like I was coming out of my skin"   Tetracycline    Rash Because of a history of documented adverse serious drug reaction;Medi Alert bracelet  is recommended Rash Because of a history of documented adverse serious drug reaction;Medi Alert braceletis recommended   Atorvastatin    D/Ced by him due to Myalgias in Sept 2014 D/Ced by him due to Myalgias in Sept 2014   Codeine    Hallucinations. "I see pink elephants"   Guaifenesin    Other reaction(s): Other (See Comments) unknown unknown   Mucinex [guaifenesin Er] Other (See Comments)   Hallucinations "I see pink elephants"      Medication List    STOP taking these medications   amLODipine 10 MG  tablet Commonly known as: NORVASC   empagliflozin 25 MG Tabs tablet Commonly known as: Jardiance   NovoLOG FlexPen 100 UNIT/ML FlexPen Generic drug: insulin aspart   ONE TOUCH ULTRA 2 w/Device Kit   ramipril 10 MG  capsule Commonly known as: ALTACE   Repatha SureClick 423 MG/ML Soaj Generic drug: Evolocumab     TAKE these medications   cloNIDine 0.3 MG tablet Commonly known as: CATAPRES TAKE 1 TABLET BY MOUTH EVERY NIGHT AT BEDTIME FOR HYPERTENSION What changed:   how much to take  how to take this  when to take this  additional instructions   FreeStyle Libre 2 Sensor Misc 2 Devices by Does not apply route every 14 (fourteen) days.   insulin lispro 100 UNIT/ML KwikPen Commonly known as: HumaLOG KwikPen upto 12 units before meals tid What changed:   how much to take  how to take this  when to take this  additional instructions   Insulin Pen Needle 31G X 5 MM Misc Use with insulin pen 3 times a day   Lantus SoloStar 100 UNIT/ML Solostar Pen Generic drug: insulin glargine Give up to 10 units under the skin every evening at bedtime. What changed: additional instructions   levothyroxine 137 MCG tablet Commonly known as: SYNTHROID TAKE 1 TABLET BY MOUTH  DAILY BEFORE BREAKFAST   morphine 15 MG 12 hr tablet Commonly known as: MS CONTIN Take 1 tablet (15 mg total) by mouth every 12 (twelve) hours. For cancer pain   nicotine 21 mg/24hr patch Commonly known as: NICODERM CQ - dosed in mg/24 hours Place 1 patch (21 mg total) onto the skin daily.   OneTouch Delica Plus [id number] Misc USE TO CHECK BLOOD SUGAR  ONCE DAILY   OneTouch Ultra test strip Generic drug: glucose blood USE TO CHECK BLOOD SUGAR  ONCE DAILY What changed:   when to take this  additional instructions   oxyCODONE 5 MG immediate release tablet Commonly known as: Oxy IR/ROXICODONE Take 1-2 tablets (5-10 mg total) by mouth every 3 (three) hours as needed for severe pain. For cancer pain   polyethylene glycol powder 17 GM/SCOOP powder Commonly known as: GLYCOLAX/MIRALAX Dissolve 1 capful (17 g total) in water and drink three times daily.   psyllium 28 % packet Commonly known as: METAMUCIL SMOOTH  TEXTURE Take 1 packet by mouth daily.       Allergies  Allergen Reactions  . Atenolol     Severe "slowing"of functioning Severe "slowing"of functioning  . Hydroxyzine Other (See Comments)    "felt like I was coming out of my skin"  . Tetracycline     Rash Because of a history of documented adverse serious drug reaction;Medi Alert bracelet  is recommended Rash Because of a history of documented adverse serious drug reaction;Medi Alert braceletis recommended  . Atorvastatin     D/Ced by him due to Myalgias in Sept 2014 D/Ced by him due to Myalgias in Sept 2014  . Codeine     Hallucinations. "I see pink elephants"  . Guaifenesin     Other reaction(s): Other (See Comments) unknown unknown  . Mucinex [Guaifenesin Er] Other (See Comments)    Hallucinations "I see pink elephants"    Consultations:  Gastroenterology  IR  Oncology   Procedures/Studies: CT CHEST WO CONTRAST  Result Date: 06/18/2020 CLINICAL DATA:  65 year old male with history of pancreatic cancer. Staging examination. EXAM: CT CHEST WITHOUT CONTRAST TECHNIQUE: Multidetector CT imaging of the chest was performed following the standard protocol without IV  contrast. COMPARISON:  Chest CT 09/11/2019. FINDINGS: Cardiovascular: Heart size is normal. There is no significant pericardial fluid, thickening or pericardial calcification. There is aortic atherosclerosis, as well as atherosclerosis of the great vessels of the mediastinum and the coronary arteries, including calcified atherosclerotic plaque in the left main, left anterior descending and right coronary arteries. Mediastinum/Nodes: No pathologically enlarged mediastinal or hilar lymph nodes. Please note that accurate exclusion of hilar adenopathy is limited on noncontrast CT scans. Esophagus is unremarkable in appearance. No axillary lymphadenopathy. Lungs/Pleura: Multiple new pulmonary nodules are noted in the lungs bilaterally, largest of which are in the left  lower lobe (axial image 90 of series 4) measuring up to 6 mm. No acute consolidative airspace disease. No pleural effusions. Upper Abdomen: Soft tissue stranding around the pancreas concerning for acute pancreatitis. Previously demonstrated pancreatic mass is not well appreciated on today's noncontrast CT examination (please see recent abdominal MRI 06/15/2020 for full description of these findings). High attenuation material in the gallbladder likely represents vicarious excretion of gadolinium from recent contrast enhanced abdominal MRI. Aortic atherosclerosis. Musculoskeletal: Flowing anterior osteophytes throughout the thoracic spine, suggesting diffuse idiopathic skeletal hyperostosis (DISH). There are no aggressive appearing lytic or blastic lesions noted in the visualized portions of the skeleton. IMPRESSION: 1. Multiple new pulmonary nodules scattered throughout the lungs bilaterally measuring up to 6 mm in the left lower lobe. These are nonspecific, but the possibility of metastatic disease is not excluded. Close attention on future follow-up studies is recommended to ensure the stability or regression of these findings. 2. Aortic atherosclerosis, in addition to left main and 2 vessel coronary artery disease. Please note that although the presence of coronary artery calcium documents the presence of coronary artery disease, the severity of this disease and any potential stenosis cannot be assessed on this non-gated CT examination. Assessment for potential risk factor modification, dietary therapy or pharmacologic therapy may be warranted, if clinically indicated. Aortic Atherosclerosis (ICD10-I70.0). Electronically Signed   By: Vinnie Langton M.D.   On: 06/18/2020 08:44   CT ABDOMEN PELVIS W CONTRAST  Result Date: 06/14/2020 CLINICAL DATA:  Diffuse abdominal pain. Bilateral flank pain for 3 weeks. EXAM: CT ABDOMEN AND PELVIS WITH CONTRAST TECHNIQUE: Multidetector CT imaging of the abdomen and pelvis  was performed using the standard protocol following bolus administration of intravenous contrast. CONTRAST:  156m OMNIPAQUE IOHEXOL 300 MG/ML  SOLN COMPARISON:  11/22/2019 FINDINGS: Lower chest: The lung bases are clear. The heart size is normal. Hepatobiliary: The liver is normal. Normal gallbladder.There is no biliary ductal dilation. Pancreas: There are inflammatory changes about the pancreas. There is pancreatic ductal dilatation with a possible abrupt cutoff in the distal pancreatic body. There is fullness of the pancreatic head with a heterogeneous appearance and areas of decreased attenuation. Spleen: Unremarkable. Adrenals/Urinary Tract: --Adrenal glands: Unremarkable. --Right kidney/ureter: No hydronephrosis or radiopaque kidney stones. --Left kidney/ureter: No hydronephrosis or radiopaque kidney stones. --Urinary bladder: Unremarkable. Stomach/Bowel: --Stomach/Duodenum: No hiatal hernia or other gastric abnormality. Normal duodenal course and caliber. --Small bowel: Unremarkable. --Colon: Unremarkable. --Appendix: Normal. Vascular/Lymphatic: Atherosclerotic calcification is present within the non-aneurysmal abdominal aorta, without hemodynamically significant stenosis. --No retroperitoneal lymphadenopathy. --No mesenteric lymphadenopathy. --No pelvic or inguinal lymphadenopathy. Reproductive: Unremarkable Other: No ascites or free air. The abdominal wall is normal. Musculoskeletal. No acute displaced fractures. IMPRESSION: 1. Abnormal appearance of the pancreas with adjacent fat stranding, pancreatic ductal dilatation, and fullness of the pancreatic head and distal pancreatic body. Findings may be secondary to acute pancreatitis. However, the fullness of  the pancreatic head and pancreatic ductal dilatation is suspicious for an underlying obstructing lesion. As such, follow-up with a nonemergent contrast enhanced MRI is recommended for further evaluation of this finding. 2. No other potentially acute  abnormality detected in the abdomen or pelvis. Electronically Signed   By: Constance Holster M.D.   On: 06/14/2020 20:19   MR ABDOMEN MRCP W WO CONTAST  Result Date: 06/15/2020 CLINICAL DATA:  Abnormal pancreas on prior CT, possible acute pancreatitis versus mass EXAM: MRI ABDOMEN WITHOUT AND WITH CONTRAST (INCLUDING MRCP) TECHNIQUE: Multiplanar multisequence MR imaging of the abdomen was performed both before and after the administration of intravenous contrast. Heavily T2-weighted images of the biliary and pancreatic ducts were obtained, and three-dimensional MRCP images were rendered by post processing. CONTRAST:  8.53m GADAVIST GADOBUTROL 1 MMOL/ML IV SOLN COMPARISON:  CT abdomen pelvis, 06/14/2020, 11/22/2019 FINDINGS: Lower chest: No acute findings. Hepatobiliary: There is a rim enhancing lesion of the right lobe of the liver abutting the gallbladder fossa, hepatic segment V, measuring 1.2 x 1.1 cm (series 2, image 42). No biliary ductal dilatation. Pancreas: Diffuse inflammatory fat stranding about the pancreas. There is a hypoenhancing lesion in the superior pancreatic head and neck measuring approximately 3.2 x 2.4 cm (series 20, image 48). Distal to this lesion, there is gross dilatation of the pancreatic duct, measuring up to 9 mm in caliber. The duct is not visualized through the pancreatic head, but is reconstituted near the ampulla. The central superior mesenteric vein and portal vein are abruptly effaced or occluded within the pancreatic head. Spleen:  Within normal limits in size and appearance. Adrenals/Urinary Tract: No masses identified. No evidence of hydronephrosis. Stomach/Bowel: Visualized portions within the abdomen are unremarkable. Vascular/Lymphatic: No pathologically enlarged lymph nodes identified. No abdominal aortic aneurysm demonstrated. Other:  None. Musculoskeletal: No suspicious bone lesions identified. IMPRESSION: 1. There is a hypoenhancing lesion in the superior pancreatic  head and neck measuring approximately 3.2 x 2.4 cm. 2. Distal to this lesion, there is gross dilatation of the pancreatic duct, measuring up to 9 mm in caliber. The duct is not visualized through the pancreatic head, but is reconstituted near the ampulla. 3. The central superior mesenteric vein and portal vein are abruptly effaced or occluded within the pancreatic head. 4. Constellation of findings is highly suspicious for pancreatic adenocarcinoma. 5. There is a rim enhancing lesion of the right lobe of the liver abutting the gallbladder fossa, hepatic segment V, measuring 1.2 x 1.1 cm. Although difficult to definitively characterize due to small size, there are no clearly benign contrast enhancement characteristics and this is highly suspicious for metastatic disease. 6. Diffuse inflammatory fat stranding about the pancreas, consistent with acute pancreatitis. Findings within the pancreatic head head and neck could reflect phlegmonous change related to pancreatitis, however fullness of the pancreatic head neck has substantially increased when compared to more remote prior dated 11/22/2019 and adenocarcinoma is the diagnosis of exclusion. Electronically Signed   By: AEddie CandleM.D.   On: 06/15/2020 20:58   (Echo, Carotid, EGD, Colonoscopy, ERCP)    Subjective: No complaints  Discharge Exam: Vitals:   06/22/20 0945 06/22/20 1005  BP: 125/67 110/66  Pulse: 66 (!) 56  Resp: 16 18  Temp:  97.9 F (36.6 C)  SpO2: 100% 99%   Vitals:   06/22/20 0935 06/22/20 0940 06/22/20 0945 06/22/20 1005  BP: 131/83 125/68 125/67 110/66  Pulse: 68 66 66 (!) 56  Resp: _0 Temp:  97.9 F (36.6 C)  TempSrc:    Oral  SpO2: 100% 100% 100% 99%  Weight:      Height:        General: Pt is alert, awake, not in acute distress Cardiovascular: RRR, S1/S2 +, no rubs, no gallops Respiratory: CTA bilaterally, no wheezing, no rhonchi Abdominal: Soft, NT, ND, bowel sounds + Extremities: no edema, no  cyanosis    The results of significant diagnostics from this hospitalization (including imaging, microbiology, ancillary and laboratory) are listed below for reference.     Microbiology: Recent Results (from the past 240 hour(s))  SARS CORONAVIRUS 2 (TAT 6-24 HRS) Nasopharyngeal Nasopharyngeal Swab     Status: None   Collection Time: 06/15/20 12:09 AM   Specimen: Nasopharyngeal Swab  Result Value Ref Range Status   SARS Coronavirus 2 NEGATIVE NEGATIVE Final    Comment: (NOTE) SARS-CoV-2 target nucleic acids are NOT DETECTED.  The SARS-CoV-2 RNA is generally detectable in upper and lower respiratory specimens during the acute phase of infection. Negative results do not preclude SARS-CoV-2 infection, do not rule out co-infections with other pathogens, and should not be used as the sole basis for treatment or other patient management decisions. Negative results must be combined with clinical observations, patient history, and epidemiological information. The expected result is Negative.  Fact Sheet for Patients: SugarRoll.be  Fact Sheet for Healthcare Providers: https://www.woods-mathews.com/  This test is not yet approved or cleared by the [place] FDA and  has been authorized for detection and/or diagnosis of SARS-CoV-2 by FDA under an Emergency Use Authorization (EUA). This EUA will remain  in effect (meaning this test can be used) for the duration of the COVID-19 declaration under Se ction 564(b)(1) of the Act, 21 U.S.C. section 360bbb-3(b)(1), unless the authorization is terminated or revoked sooner.  Performed at [facility], [place] [address] [place], [place] [address]      Labs: BNP (last 3 results) No results for input(s): BNP in the last 8760 hours. Basic Metabolic Panel: Recent Labs  Lab 06/16/20 0141 06/17/20 0223 06/21/20 0324 06/22/20 0240  NA 139 139 140 138  K 3.9 3.9 4.5 3.9  CL 105 105 103 105   CO2 _0 GLUCOSE 96 121* 114* 120*  BUN 9 8 6* 8  CREATININE 0.99 1.00 1.15 1.14  CALCIUM 9.3 9.2 9.6 9.1  MG  --   --  2.3 2.4   Liver Function Tests: Recent Labs  Lab 06/16/20 0141 06/17/20 0223 06/21/20 0324 06/22/20 0240  AST 11* 11* 14* 10*  ALT _1 ALKPHOS 72 68 69 69  BILITOT 0.4 0.6 0.5 0.3  PROT 6.1* 5.8* 6.3* 6.0*  ALBUMIN 3.5 3.3* 3.5 3.3*   No results for input(s): LIPASE, AMYLASE in the last 168 hours. No results for input(s): AMMONIA in the last 168 hours. CBC: Recent Labs  Lab 06/16/20 0141 06/17/20 0223 06/21/20 0324 06/22/20 0240  WBC 5.9 5.4 6.3 6.8  NEUTROABS  --   --  3.4 4.0  HGB 12.6* 12.1* 14.2 12.9*  HCT 37.5* 36.4* 41.3 38.7*  MCV 91.0 90.1 89.6 91.5  PLT 175 171 201 203   Cardiac Enzymes: No results for input(s): CKTOTAL, CKMB, CKMBINDEX, TROPONINI in the last 168 hours. BNP: Invalid input(s): POCBNP CBG: Recent Labs  Lab 06/21/20 0836 06/21/20 1158 06/21/20 1718 06/21/20 2119 06/22/20 0728  GLUCAP 198* 162* 172* 194* 124*   D-Dimer No results for input(s): DDIMER in the last  72 hours. Hgb A1c No results for input(s): HGBA1C in the last 72 hours. Lipid Profile No results for input(s): CHOL, HDL, LDLCALC, TRIG, CHOLHDL, LDLDIRECT in the last 72 hours. Thyroid function studies No results for input(s): TSH, T4TOTAL, T3FREE, THYROIDAB in the last 72 hours.  Invalid input(s): FREET3 Anemia work up No results for input(s): VITAMINB12, FOLATE, FERRITIN, TIBC, IRON, RETICCTPCT in the last 72 hours. Urinalysis    Component Value Date/Time   COLORURINE YELLOW 06/14/2020 2004   APPEARANCEUR CLEAR 06/14/2020 2004   LABSPEC 1.010 06/14/2020 2004   PHURINE 6.0 06/14/2020 2004   GLUCOSEU NEGATIVE 06/14/2020 2004   GLUCOSEU NEGATIVE 09/13/2016 0946   HGBUR NEGATIVE 06/14/2020 2004   HGBUR negative 05/05/2009 1323   [place] 06/14/2020 2004   BILIRUBINUR Negative 11/22/2019 [place]  06/14/2020 2004   PROTEINUR NEGATIVE 06/14/2020 2004   UROBILINOGEN 0.2 11/22/2019 1048   UROBILINOGEN 0.2 09/13/2016 0946   NITRITE NEGATIVE 06/14/2020 2004   LEUKOCYTESUR NEGATIVE 06/14/2020 2004   Sepsis Labs Invalid input(s): PROCALCITONIN,  WBC,  LACTICIDVEN Microbiology Recent Results (from the past 240 hour(s))  SARS CORONAVIRUS 2 (TAT 6-24 HRS) Nasopharyngeal Nasopharyngeal Swab     Status: None   Collection Time: 06/15/20 12:09 AM   Specimen: Nasopharyngeal Swab  Result Value Ref Range Status   SARS Coronavirus 2 NEGATIVE NEGATIVE Final    Comment: (NOTE) SARS-CoV-2 target nucleic acids are NOT DETECTED.  The SARS-CoV-2 RNA is generally detectable in upper and lower respiratory specimens during the acute phase of infection. Negative results do not preclude SARS-CoV-2 infection, do not rule out co-infections with other pathogens, and should not be used as the sole basis for treatment or other patient management decisions. Negative results must be combined with clinical observations, patient history, and epidemiological information. The expected result is Negative.  Fact Sheet for Patients: SugarRoll.be  Fact Sheet for Healthcare Providers: https://www.woods-mathews.com/  This test is not yet approved or cleared by the [place] FDA and  has been authorized for detection and/or diagnosis of SARS-CoV-2 by FDA under an Emergency Use Authorization (EUA). This EUA will remain  in effect (meaning this test can be used) for the duration of the COVID-19 declaration under Se ction 564(b)(1) of the Act, 21 U.S.C. section 360bbb-3(b)(1), unless the authorization is terminated or revoked sooner.  Performed at [facility], [place] [address], [place], [place] [address]      Time coordinating discharge: Over 30 minutes  SIGNED:   Charlynne Cousins, MD  [facility] 06/22/2020, 10:13 AM Pager   If 7PM-7AM, please  contact night-coverage [email or web address] Password [email or web address]

## 2020-06-22 NOTE — Progress Notes (Signed)
DISCHARGE NOTE HOME Timothy Page to be discharged Home per MD order. Discussed prescriptions and follow up appointments with the patient. Prescriptions given to patient; medication list explained in detail. Patient verbalized understanding.  Skin clean, dry and intact without evidence of skin break down, no evidence of skin tears noted. IV catheter discontinued intact. Site without signs and symptoms of complications. Dressing and pressure applied. Pt denies pain at the site currently. No complaints noted.  Patient free of lines, drains, and wounds.   An After Visit Summary (AVS) was printed and given to the patient. Patient escorted via wheelchair, and discharged home via private auto.  Arlyss Repress, RN

## 2020-06-22 NOTE — Procedures (Signed)
Interventional Radiology Procedure Note  Procedure: Chest port  Indication: Pancreatic Ca  Findings: Please refer to procedural dictation for full description.  Complications: None  EBL: < 10 mL  Kashara Blocher, MD [phone]   

## 2020-06-23 ENCOUNTER — Telehealth: Payer: Self-pay

## 2020-06-23 NOTE — Telephone Encounter (Signed)
Transition Care Management Unsuccessful Follow-up Telephone Call  Date of discharge and from where:  06/22/20-[facility]  Attempts:  1st Attempt  Reason for unsuccessful TCM follow-up call:  No answer/busy

## 2020-06-24 ENCOUNTER — Other Ambulatory Visit: Payer: Self-pay | Admitting: Hematology and Oncology

## 2020-06-24 ENCOUNTER — Other Ambulatory Visit: Payer: Self-pay

## 2020-06-24 ENCOUNTER — Other Ambulatory Visit (HOSPITAL_COMMUNITY): Payer: Self-pay

## 2020-06-24 ENCOUNTER — Encounter: Payer: Self-pay | Admitting: Gastroenterology

## 2020-06-24 ENCOUNTER — Inpatient Hospital Stay: Payer: 59

## 2020-06-24 ENCOUNTER — Ambulatory Visit: Payer: 59 | Admitting: Internal Medicine

## 2020-06-24 ENCOUNTER — Inpatient Hospital Stay: Payer: 59 | Attending: Genetic Counselor | Admitting: Genetic Counselor

## 2020-06-24 ENCOUNTER — Encounter: Payer: Self-pay | Admitting: Genetic Counselor

## 2020-06-24 DIAGNOSIS — C259 Malignant neoplasm of pancreas, unspecified: Secondary | ICD-10-CM | POA: Diagnosis not present

## 2020-06-24 DIAGNOSIS — Z5111 Encounter for antineoplastic chemotherapy: Secondary | ICD-10-CM | POA: Diagnosis not present

## 2020-06-24 DIAGNOSIS — Z452 Encounter for adjustment and management of vascular access device: Secondary | ICD-10-CM | POA: Insufficient documentation

## 2020-06-24 DIAGNOSIS — R634 Abnormal weight loss: Secondary | ICD-10-CM | POA: Diagnosis not present

## 2020-06-24 DIAGNOSIS — R110 Nausea: Secondary | ICD-10-CM | POA: Insufficient documentation

## 2020-06-24 DIAGNOSIS — I10 Essential (primary) hypertension: Secondary | ICD-10-CM | POA: Diagnosis not present

## 2020-06-24 DIAGNOSIS — R12 Heartburn: Secondary | ICD-10-CM | POA: Insufficient documentation

## 2020-06-24 DIAGNOSIS — G893 Neoplasm related pain (acute) (chronic): Secondary | ICD-10-CM | POA: Diagnosis not present

## 2020-06-24 LAB — GENETIC SCREENING ORDER

## 2020-06-24 MED ORDER — DEXAMETHASONE 4 MG PO TABS
8.0000 mg | ORAL_TABLET | Freq: Every day | ORAL | 5 refills | Status: DC
  Filled 2020-06-24: qty 8, 4d supply, fill #0

## 2020-06-24 MED ORDER — LOPERAMIDE HCL 2 MG PO TABS
ORAL_TABLET | ORAL | 1 refills | Status: DC
  Filled 2020-06-24: qty 100, fill #0

## 2020-06-24 MED ORDER — LIDOCAINE-PRILOCAINE 2.5-2.5 % EX CREA
TOPICAL_CREAM | CUTANEOUS | 3 refills | Status: DC
  Filled 2020-06-24: qty 30, fill #0

## 2020-06-24 MED ORDER — PROCHLORPERAZINE MALEATE 10 MG PO TABS
10.0000 mg | ORAL_TABLET | Freq: Four times a day (QID) | ORAL | 1 refills | Status: DC | PRN
  Filled 2020-06-24: qty 30, 8d supply, fill #0

## 2020-06-24 MED ORDER — LORAZEPAM 0.5 MG PO TABS
0.5000 mg | ORAL_TABLET | Freq: Four times a day (QID) | ORAL | 0 refills | Status: DC | PRN
  Filled 2020-06-24: qty 30, 8d supply, fill #0

## 2020-06-24 MED ORDER — ONDANSETRON HCL 8 MG PO TABS
8.0000 mg | ORAL_TABLET | Freq: Two times a day (BID) | ORAL | 1 refills | Status: DC | PRN
  Filled 2020-06-24: qty 30, 15d supply, fill #0

## 2020-06-24 NOTE — Telephone Encounter (Signed)
Noted. 3 unsuccessful attempts by nurse to contact pt for TCM f/u. Signed:  Crissie Sickles, MD           06/24/2020

## 2020-06-24 NOTE — Progress Notes (Signed)
REFERRING PROVIDER: [name], MD [place],  [place] [id number]  PRIMARY PROVIDER:  Tammi Sou, MD  PRIMARY REASON FOR VISIT:  1. Adenocarcinoma of pancreas, stage 4 (HCC)      HISTORY OF PRESENT ILLNESS:   Timothy Page, a 65 y.o. male, was seen for a [facility] cancer genetics consultation at the request of Dr. Chryl Heck due to a personal history of pancreatic cancer.  Timothy Page presents to clinic today to discuss the possibility of a hereditary predisposition to cancer, genetic testing, and to further clarify his future cancer risks, as well as potential cancer risks for family members.   In May 2022, at the age of 60, Timothy Page was diagnosed with pancreatic cancer.    CANCER HISTORY:  Oncology History  Adenocarcinoma of pancreas, stage 4 ([place] [facility])  06/20/2020 Initial Diagnosis   Adenocarcinoma of pancreas, stage 4 ([facility])   06/29/2020 -  Chemotherapy    Patient is on Treatment Plan: PANCREAS MODIFIED FOLFIRINOX Q14D X 4 CYCLES         Past Medical History:  Diagnosis Date  . Arthritis   . Atypical chest pain 04/2017   ACS ruled out 05/11/17  . CAP (community acquired pneumonia) 05/07/2017  . Diabetes mellitus without complication ([place])    Poor control starting fall 2021-->GAD ab neg.  Insulin and C peptide levels wnl but on lower end of normal->Dr Kuma/endo 2022.  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 Hx of adenomatous polyp of colon 07/02/2009   rpt colonoscopy 2026  . Hyperlipidemia    Intol of multiple statins and zetia. [facility] lipid clinic 2022.  [name] Hypertension   . Lipoma    back  . Myocardial infarction ([place])    MI at age 72  . Pancreatic adenocarcinoma ([place]) 06/2020   Mets to liver (?and lungs?), w/acute pancreatitis.  [name] Perianal abscess 11/2019   I&D'd by Dr.[facility], gen surg, in the ED 11/23/19  . 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 Cammy Copa  . Right ureteral  calculus 04/2018   Cystoscopy with right retrograde pyelogram and right ureteral stent placement after stone removal.  . Seasonal allergies   . Stroke ([place])    in 83A no complications   . Tachyarrhythmia 1980   age 34 X 2 over 1 month period.  No recurrence since that time.  . Thoracic aortic aneurysm ([place])    Incidentally discovered, 4.1 cm-->Dr. Cyndia Bent. Rpt CT 08/2018 and 08/2019 stable.  . Vocal cord paralysis 07/2016   Left vocal cord paralysis noted after thyroid surgery.    Past Surgical History:  Procedure Laterality Date  . BIOPSY  06/18/2020   Procedure: BIOPSY;  Surgeon: Rush Landmark Telford Nab., MD;  Location: [facility];  Service: Gastroenterology;;  . [place]  . COLONOSCOPY W/ POLYPECTOMY  07/02/2009; 01/24/20   2011 adenoma.  2021 adenomas x 3->recall 2026  . CYSTOSCOPY WITH RETROGRADE PYELOGRAM, URETEROSCOPY AND STENT PLACEMENT Right 05/09/2018   Procedure: CYSTOSCOPY WITH RIGHT RETROGRADE RIGHT URETEROSCOPY STONE EXTRACTION RIGHT STENT EXCHANGE;  Surgeon: Lucas Mallow, MD;  Location: [facility];  Service: Urology;  Laterality: Right;  . CYSTOSCOPY/RETROGRADE/URETEROSCOPY/STONE EXTRACTION WITH BASKET Right 04/30/2018   Procedure: CYSTOSCOPY/RETROGRADE/RIGHT URETEROSCOPY/;  Surgeon: Lucas Mallow, MD;  Location: [facility];  Service: Urology;  Laterality: Right;  . ESOPHAGOGASTRODUODENOSCOPY  07/02/2009   NORMAL  . ESOPHAGOGASTRODUODENOSCOPY (EGD) WITH PROPOFOL N/A 06/18/2020   Procedure: ESOPHAGOGASTRODUODENOSCOPY (EGD) WITH  PROPOFOL;  Surgeon: Irving Copas., MD;  Location: [facility];  Service: Gastroenterology;  Laterality: N/A;  . EYE SURGERY    . FINE NEEDLE ASPIRATION  06/18/2020   Procedure: FINE NEEDLE ASPIRATION (FNA) LINEAR;  Surgeon: Irving Copas., MD;  Location: [facility];  Service: Gastroenterology;;  . finger amputaion     with reattachement  . INCISION AND DRAINAGE ABSCESS ANAL  11/23/2019   gen surg (In ED).  .  IR IMAGING GUIDED PORT INSERTION  06/22/2020  . LASIK    . THYROIDECTOMY N/A 08/03/2016   Procedure: Total THYROIDECTOMY;  Surgeon: [name], MD;  Location: [facility];  Service: ENT;  Laterality: N/A;  Total Thyroidectomy   . UPPER ESOPHAGEAL ENDOSCOPIC ULTRASOUND (EUS) N/A 06/18/2020   Procedure: UPPER ESOPHAGEAL ENDOSCOPIC ULTRASOUND (EUS);  Surgeon: Irving Copas., MD;  Location: [facility];  Service: Gastroenterology;  Laterality: N/A;  . UPPER GASTROINTESTINAL ENDOSCOPY     with EUS and bx of pancreatic mass. +appearance of candida in esoph.      Social History   Socioeconomic History  . Marital status: Married    Spouse name: Not on file  . Number of children: Not on file  . Years of education: Not on file  . Highest education level: Not on file  Occupational History  . Not on file  Tobacco Use  . Smoking status: Never Smoker  . Smokeless tobacco: Current User    Types: Snuff  Vaping Use  . Vaping Use: Never used  Substance and Sexual Activity  . Alcohol use: No  . Drug use: No  . Sexual activity: Not on file  Other Topics Concern  . Not on file  Social History Narrative   Married, 2 daughters, 1 grandson that lives with him.   Educ: some college   Occup: retired from the [employer].  Retired at 35 but returned to work at some point, now with grandson living with him.   No T/A/Ds.   Social Determinants of Health   Financial Resource Strain: Not on file  Food Insecurity: Not on file  Transportation Needs: Not on file  Physical Activity: Not on file  Stress: Not on file  Social Connections: Not on file     FAMILY HISTORY:  We obtained a detailed, 4-generation family history.  Significant diagnoses are listed below: Family History  Problem Relation Age of Onset  . Diabetes Mother   . Cancer Mother        lung  . COPD Mother   . Brain cancer Mother   . Arthritis Mother   . Hyperlipidemia Mother   . Heart disease Mother   . Hypertension Mother    . Diabetes Father   . Cancer Father        mesothelioma; skin  . Mental illness Father   . Arthritis Father   . Hyperlipidemia Father   . Heart disease Father   . Hypertension Father   . Heart disease Maternal Grandmother        MI @ 17  . Heart disease Maternal Grandfather        MI @ 40  . Diabetes Paternal Grandmother   . Dementia Paternal Grandmother   . Heart disease Paternal Grandfather        MI @ 47  . Colon cancer Neg Hx   . Esophageal cancer Neg Hx   . Rectal cancer Neg Hx   . Stomach cancer Neg Hx     The patient has two  daughters who are cancer free.  He has two brothers and two sisters who are cancer free.  Both parents are deceased.  The patient's mother had lung and brain cancer.  She had two brothers who were cancer free.  Her parents both died of heart disease.  The patient's father had mesothelioma on his head.  He had three sisters and a brother who are all cancer free.  His parents died of non-cancer related issues.  Timothy Page is unaware of previous family history of genetic testing for hereditary cancer risks. Patient's maternal ancestors are of Caucasian descent, and paternal ancestors are of Caucasian descent. There is no reported Ashkenazi Jewish ancestry. There is no known consanguinity.  GENETIC COUNSELING ASSESSMENT: Timothy Page is a 65 y.o. male with a personal history of pancreatic cancer which is somewhat suggestive of a hereditary cancer syndrome and predisposition to cancer given his diagnosis of pancreatic cancer. We, therefore, discussed and recommended the following at today's visit.   DISCUSSION: We discussed that up to 15% of pancreatic cancer is hereditary, with most cases associated with BRCA mutations.  There are other genes that can be associated with hereditary pancreatic cancer syndromes.  These include ATM, PALB2 and others.  We discussed that testing is beneficial for several reasons including knowing how to follow individuals after  completing their treatment, identifying whether potential treatment options such as PARP inhibitors would be beneficial, and understand if other family members could be at risk for cancer and allow them to undergo genetic testing.   We reviewed the characteristics, features and inheritance patterns of hereditary cancer syndromes. We also discussed genetic testing, including the appropriate family members to test, the process of testing, insurance coverage and turn-around-time for results. We discussed the implications of a negative, positive, carrier and/or variant of uncertain significant result. We recommended Timothy Page pursue genetic testing for the multi-cancer/pancreatitis gene panel.   Based on Timothy Page personal history of cancer, he meets medical criteria for genetic testing. Despite that he meets criteria, he may still have an out of pocket cost. We discussed that if his out of pocket cost for testing is over $100, the laboratory will call and confirm whether he wants to proceed with testing.  If the out of pocket cost of testing is less than $100 he will be billed by the genetic testing laboratory.   PLAN: After considering the risks, benefits, and limitations, Timothy Page provided informed consent to pursue genetic testing and the blood sample was sent to [facility] for analysis of the multi-cancer/pancreatitis. Results should be available within approximately 2-3 weeks' time, at which point they will be disclosed by telephone to Timothy Page, as will any additional recommendations warranted by these results. Timothy Page will receive a summary of his genetic counseling visit and a copy of his results once available. This information will also be available in Epic.   Lastly, we encouraged Mr. Shaddock to remain in contact with cancer genetics annually so that we can continuously update the family history and inform him of any changes in cancer genetics and testing that may be of benefit for this  family.   Mr. Bonsell questions were answered to his satisfaction today. Our contact information was provided should additional questions or concerns arise. Thank you for the referral and allowing [place] to share in the care of your patient.   Jozee Hammer P. Florene Glen, [place], [facility] Licensed, Insurance risk surveyor [email or web address] phone: [phone]  The patient was seen for a total of 40  minutes in face-to-face genetic counseling.  This patient was discussed with Drs. Magrinat, Lindi Adie and/or Burr Medico who agrees with the above.    _______________________________________________________________________ For Office Staff:  Number of people involved in session: 2 Was an Intern/ student involved with case: no

## 2020-06-24 NOTE — Telephone Encounter (Signed)
Transition Care Management Unsuccessful Follow-up Telephone Call  Date of discharge and from where:  06/22/2020-[facility]  Attempts:  3rd Attempt  Reason for unsuccessful TCM follow-up call:  No answer/busy

## 2020-06-24 NOTE — Progress Notes (Signed)
The proposed treatment discussed in conference is for discussion purposes only and is not a binding recommendation.  The patients have not been physically examined, or presented with their treatment options.  Therefore, final treatment plans cannot be decided.   

## 2020-06-24 NOTE — Telephone Encounter (Signed)
Transition Care Management Unsuccessful Follow-up Telephone Call  Date of discharge and from where:  06/22/20-[facility]  Attempts:  2nd Attempt  Reason for unsuccessful TCM follow-up call:  Left voice message

## 2020-06-25 ENCOUNTER — Telehealth: Payer: Self-pay | Admitting: Hematology and Oncology

## 2020-06-25 LAB — CANCER ANTIGEN 19-9: CA 19-9: 32 U/mL (ref 0–35)

## 2020-06-25 NOTE — Telephone Encounter (Signed)
Scheduled appts per 5/7 sch msg. Pt is aware of all upcoming appts dates and times. He is aware first two tx's will be at the [facility] location and is aware of where that is located.

## 2020-06-27 ENCOUNTER — Other Ambulatory Visit: Payer: Self-pay | Admitting: Family Medicine

## 2020-06-27 ENCOUNTER — Other Ambulatory Visit: Payer: Self-pay | Admitting: Endocrinology

## 2020-06-27 DIAGNOSIS — E1165 Type 2 diabetes mellitus with hyperglycemia: Secondary | ICD-10-CM

## 2020-06-27 DIAGNOSIS — Z794 Long term (current) use of insulin: Secondary | ICD-10-CM

## 2020-06-29 ENCOUNTER — Other Ambulatory Visit (HOSPITAL_COMMUNITY): Payer: Self-pay

## 2020-06-29 ENCOUNTER — Other Ambulatory Visit: Payer: Self-pay | Admitting: Hematology and Oncology

## 2020-06-29 ENCOUNTER — Other Ambulatory Visit: Payer: Self-pay

## 2020-06-29 DIAGNOSIS — C259 Malignant neoplasm of pancreas, unspecified: Secondary | ICD-10-CM

## 2020-06-29 DIAGNOSIS — R1013 Epigastric pain: Secondary | ICD-10-CM

## 2020-06-29 MED ORDER — PROCHLORPERAZINE MALEATE 10 MG PO TABS: 10.0000 mg | ORAL_TABLET | Freq: Four times a day (QID) | ORAL | 1 refills | Status: DC | PRN

## 2020-06-29 MED ORDER — ONDANSETRON HCL 8 MG PO TABS: 8.0000 mg | ORAL_TABLET | Freq: Two times a day (BID) | ORAL | 1 refills | Status: DC | PRN

## 2020-06-29 MED ORDER — OXYCODONE HCL 5 MG PO TABS: 5.0000 mg | ORAL_TABLET | ORAL | 0 refills | Status: DC | PRN

## 2020-06-29 MED ORDER — DEXAMETHASONE 4 MG PO TABS: 8.0000 mg | ORAL_TABLET | Freq: Every day | ORAL | 5 refills | Status: DC

## 2020-06-29 MED ORDER — LORAZEPAM 0.5 MG PO TABS: 0.5000 mg | ORAL_TABLET | Freq: Four times a day (QID) | ORAL | 0 refills | Status: DC | PRN

## 2020-06-29 MED ORDER — LOPERAMIDE HCL 2 MG PO TABS: ORAL_TABLET | ORAL | 1 refills | Status: DC

## 2020-06-29 MED ORDER — LIDOCAINE-PRILOCAINE 2.5-2.5 % EX CREA: TOPICAL_CREAM | CUTANEOUS | 3 refills | Status: DC

## 2020-06-30 ENCOUNTER — Ambulatory Visit: Payer: 59 | Admitting: Nutrition

## 2020-06-30 ENCOUNTER — Inpatient Hospital Stay: Payer: 59

## 2020-06-30 ENCOUNTER — Other Ambulatory Visit: Payer: Self-pay | Admitting: Family Medicine

## 2020-06-30 ENCOUNTER — Encounter: Payer: Self-pay | Admitting: Hematology and Oncology

## 2020-06-30 ENCOUNTER — Other Ambulatory Visit: Payer: Self-pay | Admitting: Hematology and Oncology

## 2020-06-30 ENCOUNTER — Inpatient Hospital Stay (HOSPITAL_BASED_OUTPATIENT_CLINIC_OR_DEPARTMENT_OTHER): Payer: 59 | Admitting: Hematology and Oncology

## 2020-06-30 ENCOUNTER — Other Ambulatory Visit: Payer: Self-pay

## 2020-06-30 ENCOUNTER — Other Ambulatory Visit: Payer: 59

## 2020-06-30 ENCOUNTER — Telehealth: Payer: Self-pay | Admitting: Family Medicine

## 2020-06-30 DIAGNOSIS — C259 Malignant neoplasm of pancreas, unspecified: Secondary | ICD-10-CM

## 2020-06-30 DIAGNOSIS — R634 Abnormal weight loss: Secondary | ICD-10-CM

## 2020-06-30 DIAGNOSIS — G893 Neoplasm related pain (acute) (chronic): Secondary | ICD-10-CM | POA: Diagnosis not present

## 2020-06-30 DIAGNOSIS — Z5111 Encounter for antineoplastic chemotherapy: Secondary | ICD-10-CM | POA: Diagnosis not present

## 2020-06-30 DIAGNOSIS — R12 Heartburn: Secondary | ICD-10-CM

## 2020-06-30 DIAGNOSIS — R110 Nausea: Secondary | ICD-10-CM | POA: Diagnosis not present

## 2020-06-30 DIAGNOSIS — T50905A Adverse effect of unspecified drugs, medicaments and biological substances, initial encounter: Secondary | ICD-10-CM | POA: Insufficient documentation

## 2020-06-30 DIAGNOSIS — Z95828 Presence of other vascular implants and grafts: Secondary | ICD-10-CM

## 2020-06-30 LAB — CMP (CANCER CENTER ONLY)
ALT: 10 U/L (ref 0–44)
AST: 11 U/L — ABNORMAL LOW (ref 15–41)
Albumin: 3.9 g/dL (ref 3.5–5.0)
Alkaline Phosphatase: 78 U/L (ref 38–126)
Anion gap: 10 (ref 5–15)
BUN: 14 mg/dL (ref 8–23)
CO2: 25 mmol/L (ref 22–32)
Calcium: 9.9 mg/dL (ref 8.9–10.3)
Chloride: 106 mmol/L (ref 98–111)
Creatinine: 1.04 mg/dL (ref 0.61–1.24)
GFR, Estimated: >60 mL/min (ref >60)
Glucose, Bld: 96 mg/dL (ref 70–99)
Potassium: 3.9 mmol/L (ref 3.5–5.1)
Sodium: 141 mmol/L (ref 135–145)
Total Bilirubin: 0.5 mg/dL (ref 0.3–1.2)
Total Protein: 6.9 g/dL (ref 6.5–8.1)

## 2020-06-30 LAB — CBC WITH DIFFERENTIAL (CANCER CENTER ONLY)
Abs Immature Granulocytes: 0.01 10*3/uL (ref 0.00–0.07)
Basophils Absolute: 0 10*3/uL (ref 0.0–0.1)
Basophils Relative: 1 %
Eosinophils Absolute: 0.1 10*3/uL (ref 0.0–0.5)
Eosinophils Relative: 2 %
HCT: 37.8 % — ABNORMAL LOW (ref 39.0–52.0)
Hemoglobin: 13.1 g/dL (ref 13.0–17.0)
Immature Granulocytes: 0 %
Lymphocytes Relative: 24 %
Lymphs Abs: 1.4 10*3/uL (ref 0.7–4.0)
MCH: 30.7 pg (ref 26.0–34.0)
MCHC: 34.7 g/dL (ref 30.0–36.0)
MCV: 88.5 fL (ref 80.0–100.0)
Monocytes Absolute: 0.4 10*3/uL (ref 0.1–1.0)
Monocytes Relative: 7 %
Neutro Abs: 4 10*3/uL (ref 1.7–7.7)
Neutrophils Relative %: 66 %
Platelet Count: 186 10*3/uL (ref 150–400)
RBC: 4.27 MIL/uL (ref 4.22–5.81)
RDW: 12.1 % (ref 11.5–15.5)
WBC Count: 6 10*3/uL (ref 4.0–10.5)
nRBC: 0 % (ref 0.0–0.2)

## 2020-06-30 MED ORDER — PANCRELIPASE (LIP-PROT-AMYL) 36000-114000 UNITS PO CPEP: ORAL_CAPSULE | ORAL | 11 refills | Status: AC

## 2020-06-30 MED ORDER — HEPARIN SOD (PORK) LOCK FLUSH 100 UNIT/ML IV SOLN
500.0000 [IU] | Freq: Once | INTRAVENOUS | Status: AC
  Administered 2020-06-30: 500 [IU] via INTRAVENOUS
  Filled 2020-06-30: qty 5

## 2020-06-30 MED ORDER — SODIUM CHLORIDE 0.9% FLUSH
10.0000 mL | INTRAVENOUS | Status: DC | PRN
  Administered 2020-06-30: 10 mL via INTRAVENOUS
  Filled 2020-06-30: qty 10

## 2020-06-30 MED ORDER — OMEPRAZOLE 20 MG PO CPDR: 20.0000 mg | DELAYED_RELEASE_CAPSULE | Freq: Every day | ORAL | 0 refills | Status: DC

## 2020-06-30 NOTE — Assessment & Plan Note (Signed)
He is currently on clonidine for management of hypertension and morphine.  There is a drug interaction, level to be between these 2 medications.  Given other alternatives present for hypertension and weight loss leading to better hypertension control, I recommended that he talk to his PCP immediately about withdrawing clonidine and trying an alternative medicine for hypertension.  He agrees that morphine has been helping him tremendously with pain control and hence he would like to continue it.

## 2020-06-30 NOTE — Progress Notes (Signed)
[place] FOLLOW UP NOTE  Patient Care Team: Tammi Sou, MD as PCP - General (Family Medicine) Gatha Mayer, MD as Consulting Physician (Gastroenterology) [name], MD as Consulting Physician (Otolaryngology) Elayne Snare, MD as Consulting Physician (Endocrinology) Gaye Pollack, MD as Consulting Physician (Cardiothoracic Surgery) Lucas Mallow, MD as Consulting Physician (Urology) Debara Pickett Nadean Corwin, MD as Consulting Physician (Cardiology) [name], MD as Consulting Physician (Hematology and Oncology) Jonnie Finner, RN as Oncology Nurse Navigator  CHIEF COMPLAINTS/PURPOSE OF CONSULTATION:  Pancreatic cancer, follow up prior to 1 st cycle of FOLFIRINOX  ASSESSMENT & PLAN:  Adenocarcinoma of pancreas, stage 4 ([place]) This is a very pleasant 65 year old male patient diagnosed with metastatic adenocarcinoma of the pancreas with lesions in the liver, possibly lung and peritoneal nodules who is here for follow-up prior to his cycle 1 of FOLFIRINOX scheduled for tomorrow. He continues to suffer from pain related to the cancer. He is currently on morphine for long-acting pain medicine and oxycodone for short acting pain medicine. Besides the pain, patient heartburn, some diarrhea, he has been doing okay.  His labs look satisfactory to proceed with chemotherapy tomorrow.  Cancer related pain Severe pain limiting all his activities of daily living from the pancreatic adenocarcinoma.   He is currently on morphine long-acting 15 mg twice daily and oxycodone every 3 hours for pain management.  Since the pain is not well controlled, I recommended that he change morphine to every 8 hours and continue oxycodone as needed for pain management.  We can consider switching to Dilaudid for short acting pain medication in the future if he has no relief.  Heart burn We will start him on Prilosec 20 mg p.o. daily.  Nausea without vomiting He can try the Zofran or  Compazine as needed for management of nausea.  Adverse drug interaction with prescription medication He is currently on clonidine for management of hypertension and morphine.  There is a drug interaction, level to be between these 2 medications.  Given other alternatives present for hypertension and weight loss leading to better hypertension control, I recommended that he talk to his PCP immediately about withdrawing clonidine and trying an alternative medicine for hypertension.  He agrees that morphine has been helping him tremendously with pain control and hence he would like to continue it.  Weight loss, unintentional Weight loss, unintentional related to cancer and poor p.o. intake.  He has lost 5 pounds since his last visit.  Referral made to nutrition.  I encouraged trying nutritional supplement such as Ensure or boost and timing it when he takes his pain medication and his pain is well controlled.  We will also try Creon to help with digestion which may aid in pain control.  No orders of the defined types were placed in this encounter.    HISTORY OF PRESENTING ILLNESS:  Timothy Page 65 y.o. male is here because of new diagnosis of pancreatic cancer.  Oncology History  Adenocarcinoma of pancreas, stage 4 ([place])  06/20/2020 Initial Diagnosis   Adenocarcinoma of pancreas, stage 4 ([place])   07/01/2020 -  Chemotherapy    Patient is on Treatment Plan: PANCREAS MODIFIED FOLFIRINOX Q14D X 4 CYCLES       INTERIM HISTORY  Mr. Faucette is here for follow-up with his wife. He states that his stomach pain has been quite uncontrolled.  He has been feeling really bad from the pain aspect.  Morphine works for about 7 to [place].  Oxycodone  has not been helping his stomach pain.  Every time he eats he has excruciating pain and this limits him from eating.  Since he came out of the hospital, he lost about 5 pounds of weight.  He has occasional episodes of heartburn, especially when he does not eat, his  whole stomach feels like it is burning up. He will start his first cycle of chemotherapy tomorrow. Patient describes loose bowel movements, not necessarily diarrhea, last bowel movement 2 days ago. No change in urinary habits.  No leg swelling.  Rest of the pertinent 10 point ROS reviewed and negative.  MEDICAL HISTORY:  Past Medical History:  Diagnosis Date  . Arthritis   . Atypical chest pain 04/2017   ACS ruled out 05/11/17  . CAP (community acquired pneumonia) 05/07/2017  . Diabetes mellitus without complication ([place])    Poor control starting fall 2021-->GAD ab neg.  Insulin and C peptide levels wnl but on lower end of normal->Dr Kuma/endo 2022.  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 Hx of adenomatous polyp of colon 07/02/2009   rpt colonoscopy 2026  . Hyperlipidemia    Intol of multiple statins and zetia. [facility] 2022.  [name] Hypertension   . Lipoma    back  . Myocardial infarction ([place])    MI at age 73  . Pancreatic adenocarcinoma ([place]) 06/2020   Mets to liver (?and lungs?), w/acute pancreatitis.  [name] Perianal abscess 11/2019   I&D'd by Dr.Thompson, gen surg, in the ED 11/23/19  . 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 Cammy Copa  . Right ureteral calculus 04/2018   Cystoscopy with right retrograde pyelogram and right ureteral stent placement after stone removal.  . Seasonal allergies   . Stroke ([place])    in 49Q no complications   . Tachyarrhythmia 1980   age 69 X 2 over 14 month period.  No recurrence since that time.  . Thoracic aortic aneurysm ([place])    Incidentally discovered, 4.1 cm-->Dr. Cyndia Bent. Rpt CT 08/2018 and 08/2019 stable.  . Vocal cord paralysis 07/2016   Left vocal cord paralysis noted after thyroid surgery.    SURGICAL HISTORY: Past Surgical History:  Procedure Laterality Date  . BIOPSY  06/18/2020   Procedure: BIOPSY;  Surgeon: Rush Landmark Telford Nab., MD;  Location: [facility];  Service: Gastroenterology;;  . [place]  . COLONOSCOPY W/ POLYPECTOMY  07/02/2009; 01/24/20   2011 adenoma.  2021 adenomas x 3->recall 2026  . CYSTOSCOPY WITH RETROGRADE PYELOGRAM, URETEROSCOPY AND STENT PLACEMENT Right 05/09/2018   Procedure: CYSTOSCOPY WITH RIGHT RETROGRADE RIGHT URETEROSCOPY STONE EXTRACTION RIGHT STENT EXCHANGE;  Surgeon: Lucas Mallow, MD;  Location: [facility];  Service: Urology;  Laterality: Right;  . CYSTOSCOPY/RETROGRADE/URETEROSCOPY/STONE EXTRACTION WITH BASKET Right 04/30/2018   Procedure: CYSTOSCOPY/RETROGRADE/RIGHT URETEROSCOPY/;  Surgeon: Lucas Mallow, MD;  Location: [facility];  Service: Urology;  Laterality: Right;  . ESOPHAGOGASTRODUODENOSCOPY  07/02/2009   NORMAL  . ESOPHAGOGASTRODUODENOSCOPY (EGD) WITH PROPOFOL N/A 06/18/2020   Procedure: ESOPHAGOGASTRODUODENOSCOPY (EGD) WITH PROPOFOL;  Surgeon: Rush Landmark Telford Nab., MD;  Location: [facility];  Service: Gastroenterology;  Laterality: N/A;  . EYE SURGERY    . FINE NEEDLE ASPIRATION  06/18/2020   Procedure: FINE NEEDLE ASPIRATION (FNA) LINEAR;  Surgeon: Irving Copas., MD;  Location: [facility];  Service: Gastroenterology;;  . finger amputaion     with reattachement  . INCISION AND DRAINAGE ABSCESS ANAL  11/23/2019   gen surg (  In ED).  . IR IMAGING GUIDED PORT INSERTION  06/22/2020  . LASIK    . THYROIDECTOMY N/A 08/03/2016   Procedure: Total THYROIDECTOMY;  Surgeon: [name], MD;  Location: [facility];  Service: ENT;  Laterality: N/A;  Total Thyroidectomy   . UPPER ESOPHAGEAL ENDOSCOPIC ULTRASOUND (EUS) N/A 06/18/2020   Procedure: UPPER ESOPHAGEAL ENDOSCOPIC ULTRASOUND (EUS);  Surgeon: Irving Copas., MD;  Location: [facility];  Service: Gastroenterology;  Laterality: N/A;  . UPPER GASTROINTESTINAL ENDOSCOPY     with EUS and bx of pancreatic mass. +appearance of candida in esoph.      SOCIAL HISTORY: Social History   Socioeconomic History  . Marital  status: Married    Spouse name: Not on file  . Number of children: Not on file  . Years of education: Not on file  . Highest education level: Not on file  Occupational History  . Not on file  Tobacco Use  . Smoking status: Never Smoker  . Smokeless tobacco: Current User    Types: Snuff  Vaping Use  . Vaping Use: Never used  Substance and Sexual Activity  . Alcohol use: No  . Drug use: No  . Sexual activity: Not on file  Other Topics Concern  . Not on file  Social History Narrative   Married, 2 daughters, 1 grandson that lives with him.   Educ: some college   Occup: retired from the [employer].  Retired at 66 but returned to work at some point, now with grandson living with him.   No T/A/Ds.   Social Determinants of Health   Financial Resource Strain: Not on file  Food Insecurity: Not on file  Transportation Needs: Not on file  Physical Activity: Not on file  Stress: Not on file  Social Connections: Not on file  Intimate Partner Violence: Not on file    FAMILY HISTORY: Family History  Problem Relation Age of Onset  . Diabetes Mother   . Cancer Mother        lung  . COPD Mother   . Brain cancer Mother   . Arthritis Mother   . Hyperlipidemia Mother   . Heart disease Mother   . Hypertension Mother   . Diabetes Father   . Cancer Father        mesothelioma; skin  . Mental illness Father   . Arthritis Father   . Hyperlipidemia Father   . Heart disease Father   . Hypertension Father   . Heart disease Maternal Grandmother        MI @ 40  . Heart disease Maternal Grandfather        MI @ 34  . Diabetes Paternal Grandmother   . Dementia Paternal Grandmother   . Heart disease Paternal Grandfather        MI @ 53  . Colon cancer Neg Hx   . Esophageal cancer Neg Hx   . Rectal cancer Neg Hx   . Stomach cancer Neg Hx     ALLERGIES:  is allergic to atenolol, hydroxyzine, tetracycline, atorvastatin, codeine, guaifenesin, and mucinex [guaifenesin  er].  MEDICATIONS:  Current Outpatient Medications  Medication Sig Dispense Refill  . cloNIDine (CATAPRES) 0.3 MG tablet TAKE 1 TABLET BY MOUTH EVERY NIGHT AT BEDTIME FOR HYPERTENSION (Patient taking differently: Take 0.3 mg by mouth daily.) 90 tablet 1  . Continuous Blood Gluc Sensor (FREESTYLE LIBRE 2 SENSOR) MISC 2 Devices by Does not apply route every 14 (fourteen) days. 2 each 3  . dexamethasone (  DECADRON) 4 MG tablet Take 2 tablets (8 mg total) by mouth daily. Start the day after chemotherapy for 3 days. Take with food. 8 tablet 5  . insulin glargine (LANTUS SOLOSTAR) 100 UNIT/ML Solostar Pen Give up to 10 units under the skin every evening at bedtime. (Patient taking differently: Give up to 10 units under the skin every evening at bedtime.) 15 mL 1  . insulin lispro (HUMALOG KWIKPEN) 100 UNIT/ML KwikPen INJECT SUBCUTANEOUSLY UP TO 12 UNITS BEFORE MEALS 3  TIMES DAILY 30 mL 3  . Insulin Pen Needle 31G X 5 MM MISC Use with insulin pen 3 times a day 100 each 1  . Lancets (ONETOUCH DELICA PLUS Q000111Q) MISC USE TO CHECK BLOOD SUGAR  ONCE DAILY 100 each 3  . levothyroxine (SYNTHROID) 137 MCG tablet TAKE 1 TABLET BY MOUTH  DAILY BEFORE BREAKFAST (Patient taking differently: Take 137 mcg by mouth daily before breakfast.) 90 tablet 3  . lidocaine-prilocaine (EMLA) cream Apply to affected area once 30 g 3  . lipase/protease/amylase (CREON) 36000 UNITS CPEP capsule Take 2 capsules (72,000 Units total) by mouth 3 (three) times daily with meals. May also take 1 capsule (36,000 Units total) as needed (with snacks). 240 capsule 11  . loperamide (IMODIUM A-D) 2 MG tablet Take 2 at onset of diarrhea, then 1 every 2hrs until 12hr without a BM. May take 2 tab every 4hrs at bedtime. If diarrhea recurs repeat. 100 tablet 1  . LORazepam (ATIVAN) 0.5 MG tablet Take 1 tablet (0.5 mg total) by mouth every 6 (six) hours as needed for anxiety. 30 tablet 0  . morphine (MS CONTIN) 15 MG 12 hr tablet Take 1 tablet (15  mg total) by mouth every 12 (twelve) hours. For cancer pain 30 tablet 0  . nicotine (NICODERM CQ - DOSED IN MG/24 HOURS) 21 mg/24hr patch Place 1 patch (21 mg total) onto the skin daily. 28 patch 0  . omeprazole (PRILOSEC) 20 MG capsule Take 1 capsule (20 mg total) by mouth daily. 30 capsule 0  . ondansetron (ZOFRAN) 8 MG tablet Take 1 tablet (8 mg total) by mouth 2 (two) times daily as needed. Start on day 3 after chemotherapy. 30 tablet 1  . ONETOUCH ULTRA test strip USE TO CHECK BLOOD SUGAR  ONCE DAILY 100 strip 3  . oxyCODONE (OXY IR/ROXICODONE) 5 MG immediate release tablet Take 1-2 tablets (5-10 mg total) by mouth every 3 (three) hours as needed for severe pain. For cancer pain 30 tablet 0  . polyethylene glycol powder (GLYCOLAX/MIRALAX) 17 GM/SCOOP powder Dissolve 1 capful (17 g total) in water and drink three times daily. 510 g 0  . prochlorperazine (COMPAZINE) 10 MG tablet Take 1 tablet (10 mg total) by mouth every 6 (six) hours as needed (Nausea or vomiting). 30 tablet 1  . psyllium (METAMUCIL SMOOTH TEXTURE) 28 % packet Take 1 packet by mouth daily.     No current facility-administered medications for this visit.      PHYSICAL EXAMINATION: ECOG PERFORMANCE STATUS: 1 - Symptomatic but completely ambulatory  Vitals:   06/30/20 0910  BP: (!) 145/84  Pulse: (!) 58  Resp: 17  Temp: 97.9 F (36.6 C)  SpO2: 99%   Filed Weights   06/30/20 0910  Weight: 185 lb 8 oz (84.1 kg)    GENERAL:alert, no distress and comfortable SKIN: skin color, texture, turgor are normal, no rashes or significant lesions EYES: normal, conjunctiva are pink and non-injected, sclera clear OROPHARYNX:no exudate, no erythema and lips,  buccal mucosa, and tongue normal  NECK: supple, thyroid normal size, non-tender, without nodularity LYMPH:  no palpable lymphadenopathy in the cervical, axillary or inguinal LUNGS: clear to auscultation and percussion with normal breathing effort HEART: regular rate &  rhythm and no murmurs and no lower extremity edema ABDOMEN: Severe abdominal pain in both upper quadrants and bandlike radiating to the back.   Musculoskeletal:no cyanosis of digits and no clubbing  PSYCH: alert & oriented x 3 with fluent speech NEURO: no focal motor/sensory deficits  LABORATORY DATA:  I have reviewed the data as listed Lab Results  Component Value Date   WBC 6.0 06/30/2020   HGB 13.1 06/30/2020   HCT 37.8 (L) 06/30/2020   MCV 88.5 06/30/2020   PLT 186 06/30/2020     Chemistry      Component Value Date/Time   NA 141 06/30/2020 0858   K 3.9 06/30/2020 0858   CL 106 06/30/2020 0858   CO2 25 06/30/2020 0858   BUN 14 06/30/2020 0858   CREATININE 1.04 06/30/2020 0858   CREATININE 1.11 11/03/2017 1533      Component Value Date/Time   CALCIUM 9.9 06/30/2020 0858   ALKPHOS 78 06/30/2020 0858   AST 11 (L) 06/30/2020 0858   ALT 10 06/30/2020 0858   BILITOT 0.5 06/30/2020 0858       RADIOGRAPHIC STUDIES: I have personally reviewed the radiological images as listed and agreed with the findings in the report. CT CHEST WO CONTRAST  Result Date: 06/18/2020 CLINICAL DATA:  65 year old male with history of pancreatic cancer. Staging examination. EXAM: CT CHEST WITHOUT CONTRAST TECHNIQUE: Multidetector CT imaging of the chest was performed following the standard protocol without IV contrast. COMPARISON:  Chest CT 09/11/2019. FINDINGS: Cardiovascular: Heart size is normal. There is no significant pericardial fluid, thickening or pericardial calcification. There is aortic atherosclerosis, as well as atherosclerosis of the great vessels of the mediastinum and the coronary arteries, including calcified atherosclerotic plaque in the left main, left anterior descending and right coronary arteries. Mediastinum/Nodes: No pathologically enlarged mediastinal or hilar lymph nodes. Please note that accurate exclusion of hilar adenopathy is limited on noncontrast CT scans. Esophagus is  unremarkable in appearance. No axillary lymphadenopathy. Lungs/Pleura: Multiple new pulmonary nodules are noted in the lungs bilaterally, largest of which are in the left lower lobe (axial image 90 of series 4) measuring up to 6 mm. No acute consolidative airspace disease. No pleural effusions. Upper Abdomen: Soft tissue stranding around the pancreas concerning for acute pancreatitis. Previously demonstrated pancreatic mass is not well appreciated on today's noncontrast CT examination (please see recent abdominal MRI 06/15/2020 for full description of these findings). High attenuation material in the gallbladder likely represents vicarious excretion of gadolinium from recent contrast enhanced abdominal MRI. Aortic atherosclerosis. Musculoskeletal: Flowing anterior osteophytes throughout the thoracic spine, suggesting diffuse idiopathic skeletal hyperostosis (DISH). There are no aggressive appearing lytic or blastic lesions noted in the visualized portions of the skeleton. IMPRESSION: 1. Multiple new pulmonary nodules scattered throughout the lungs bilaterally measuring up to 6 mm in the left lower lobe. These are nonspecific, but the possibility of metastatic disease is not excluded. Close attention on future follow-up studies is recommended to ensure the stability or regression of these findings. 2. Aortic atherosclerosis, in addition to left main and 2 vessel coronary artery disease. Please note that although the presence of coronary artery calcium documents the presence of coronary artery disease, the severity of this disease and any potential stenosis cannot be assessed on this non-gated  CT examination. Assessment for potential risk factor modification, dietary therapy or pharmacologic therapy may be warranted, if clinically indicated. Aortic Atherosclerosis (ICD10-I70.0). Electronically Signed   By: Vinnie Langton M.D.   On: 06/18/2020 08:44   CT ABDOMEN PELVIS W CONTRAST  Result Date: 06/14/2020 CLINICAL  DATA:  Diffuse abdominal pain. Bilateral flank pain for 3 weeks. EXAM: CT ABDOMEN AND PELVIS WITH CONTRAST TECHNIQUE: Multidetector CT imaging of the abdomen and pelvis was performed using the standard protocol following bolus administration of intravenous contrast. CONTRAST:  184mL OMNIPAQUE IOHEXOL 300 MG/ML  SOLN COMPARISON:  11/22/2019 FINDINGS: Lower chest: The lung bases are clear. The heart size is normal. Hepatobiliary: The liver is normal. Normal gallbladder.There is no biliary ductal dilation. Pancreas: There are inflammatory changes about the pancreas. There is pancreatic ductal dilatation with a possible abrupt cutoff in the distal pancreatic body. There is fullness of the pancreatic head with a heterogeneous appearance and areas of decreased attenuation. Spleen: Unremarkable. Adrenals/Urinary Tract: --Adrenal glands: Unremarkable. --Right kidney/ureter: No hydronephrosis or radiopaque kidney stones. --Left kidney/ureter: No hydronephrosis or radiopaque kidney stones. --Urinary bladder: Unremarkable. Stomach/Bowel: --Stomach/Duodenum: No hiatal hernia or other gastric abnormality. Normal duodenal course and caliber. --Small bowel: Unremarkable. --Colon: Unremarkable. --Appendix: Normal. Vascular/Lymphatic: Atherosclerotic calcification is present within the non-aneurysmal abdominal aorta, without hemodynamically significant stenosis. --No retroperitoneal lymphadenopathy. --No mesenteric lymphadenopathy. --No pelvic or inguinal lymphadenopathy. Reproductive: Unremarkable Other: No ascites or free air. The abdominal wall is normal. Musculoskeletal. No acute displaced fractures. IMPRESSION: 1. Abnormal appearance of the pancreas with adjacent fat stranding, pancreatic ductal dilatation, and fullness of the pancreatic head and distal pancreatic body. Findings may be secondary to acute pancreatitis. However, the fullness of the pancreatic head and pancreatic ductal dilatation is suspicious for an underlying  obstructing lesion. As such, follow-up with a nonemergent contrast enhanced MRI is recommended for further evaluation of this finding. 2. No other potentially acute abnormality detected in the abdomen or pelvis. Electronically Signed   By: Constance Holster M.D.   On: 06/14/2020 20:19   MR ABDOMEN MRCP W WO CONTAST  Result Date: 06/15/2020 CLINICAL DATA:  Abnormal pancreas on prior CT, possible acute pancreatitis versus mass EXAM: MRI ABDOMEN WITHOUT AND WITH CONTRAST (INCLUDING MRCP) TECHNIQUE: Multiplanar multisequence MR imaging of the abdomen was performed both before and after the administration of intravenous contrast. Heavily T2-weighted images of the biliary and pancreatic ducts were obtained, and three-dimensional MRCP images were rendered by post processing. CONTRAST:  8.5mL GADAVIST GADOBUTROL 1 MMOL/ML IV SOLN COMPARISON:  CT abdomen pelvis, 06/14/2020, 11/22/2019 FINDINGS: Lower chest: No acute findings. Hepatobiliary: There is a rim enhancing lesion of the right lobe of the liver abutting the gallbladder fossa, hepatic segment V, measuring 1.2 x 1.1 cm (series 2, image 42). No biliary ductal dilatation. Pancreas: Diffuse inflammatory fat stranding about the pancreas. There is a hypoenhancing lesion in the superior pancreatic head and neck measuring approximately 3.2 x 2.4 cm (series 20, image 48). Distal to this lesion, there is gross dilatation of the pancreatic duct, measuring up to 9 mm in caliber. The duct is not visualized through the pancreatic head, but is reconstituted near the ampulla. The central superior mesenteric vein and portal vein are abruptly effaced or occluded within the pancreatic head. Spleen:  Within normal limits in size and appearance. Adrenals/Urinary Tract: No masses identified. No evidence of hydronephrosis. Stomach/Bowel: Visualized portions within the abdomen are unremarkable. Vascular/Lymphatic: No pathologically enlarged lymph nodes identified. No abdominal aortic  aneurysm demonstrated. Other:  None. Musculoskeletal:  No suspicious bone lesions identified. IMPRESSION: 1. There is a hypoenhancing lesion in the superior pancreatic head and neck measuring approximately 3.2 x 2.4 cm. 2. Distal to this lesion, there is gross dilatation of the pancreatic duct, measuring up to 9 mm in caliber. The duct is not visualized through the pancreatic head, but is reconstituted near the ampulla. 3. The central superior mesenteric vein and portal vein are abruptly effaced or occluded within the pancreatic head. 4. Constellation of findings is highly suspicious for pancreatic adenocarcinoma. 5. There is a rim enhancing lesion of the right lobe of the liver abutting the gallbladder fossa, hepatic segment V, measuring 1.2 x 1.1 cm. Although difficult to definitively characterize due to small size, there are no clearly benign contrast enhancement characteristics and this is highly suspicious for metastatic disease. 6. Diffuse inflammatory fat stranding about the pancreas, consistent with acute pancreatitis. Findings within the pancreatic head head and neck could reflect phlegmonous change related to pancreatitis, however fullness of the pancreatic head neck has substantially increased when compared to more remote prior dated 11/22/2019 and adenocarcinoma is the diagnosis of exclusion. Electronically Signed   By: Eddie Candle M.D.   On: 06/15/2020 20:58   IR IMAGING GUIDED PORT INSERTION  Result Date: 06/22/2020 INDICATION: 65 year old gentleman pancreatic malignancy presents to interventional radiology for chest port placement EXAM: IMPLANTED PORT A CATH PLACEMENT WITH ULTRASOUND AND FLUOROSCOPIC GUIDANCE MEDICATIONS: None ANESTHESIA/SEDATION: Moderate (conscious) sedation was employed during this procedure. A total of Versed 1.5 mg and Fentanyl 50 mcg was administered intravenously. Moderate Sedation Time: 13 minutes. The patient's level of consciousness and vital signs were monitored  continuously by radiology nursing throughout the procedure under my direct supervision. FLUOROSCOPY TIME:  0.4 minutes (4 mGy) COMPLICATIONS: None immediate. PROCEDURE: The procedure, risks, benefits, and alternatives were explained to the patient. Questions regarding the procedure were encouraged and answered. The patient understands and consents to the procedure. A timeout was performed prior to the initiation of the procedure. Patient positioned supine on the angiography table. Right neck and anterior upper chest prepped and draped in the usual sterile fashion. All elements of maximal sterile barrier were utilized including, cap, mask, sterile gown, sterile gloves, large sterile drape, hand scrubbing and 2% Chlorhexidine for skin cleaning. The right internal jugular vein was evaluated with ultrasound and shown to be patent. A permanent ultrasound image was obtained and placed in the patient's medical record. Local anesthesia was provided with 1% lidocaine with epinephrine. Using sterile gel and a sterile probe cover, the right internal jugular vein was entered with a 21 ga needle during real time ultrasound guidance. 0.018 inch guidewire placed and 21 ga needle exchanged for transitional dilator set. Utilizing fluoroscopy, 0.035 inch guidewire advanced through the needle without difficulty. Attention then turned to the right anterior upper chest. Following local lidocaine administration, a port pocket was created. The catheter was connected to the port and brought from the pocket to the venotomy site through a subcutaneous tunnel. The catheter was cut to size and inserted through the peel-away sheath. The catheter tip was positioned at the cavoatrial junction using fluoroscopic guidance. The port aspirated and flushed well. The port pocket was closed with deep and superficial absorbable suture. The port pocket incision and venotomy sites were also sealed with Dermabond. IMPRESSION: Successful placement of a right  internal jugular approach power injectable Port-A-Cath. The catheter is ready for immediate use. Electronically Signed   By: Miachel Roux M.D.   On: 06/22/2020 16:00  All questions were answered. The patient knows to call the clinic with any problems, questions or concerns.     [name], MD 06/30/2020 10:33 AM

## 2020-06-30 NOTE — Telephone Encounter (Signed)
Caller Name: Precious Bard, Wife Call back phone #: [phone]  MEDICATION(S): cloNIDine (CATAPRES) 0.3 MG tablet  Precious Bard called stating the pts oncologist is requesting that Dr. Anitra Lauth discontinue/wean pt from clonidine or possible change to different medication due to interaction with treatments.

## 2020-06-30 NOTE — Progress Notes (Signed)
Met with patient and his wife Precious Bard at their initial medical oncology/hospital follow up with Dr. Chryl Heck.  I introduced myself and explained my role as nurse navigator.  They were given my direct contact information.  The patient reports constant pain and increased pain with eating.  Dr. Chryl Heck has made adjustments in his pain medication today.  His wife recently purchased some Boost however he has not tried it yet.  I placed a referral for our nutritionist and she plans to reach out to them today via telephone.  I have encouraged them to call with any questions or concerns.

## 2020-06-30 NOTE — Assessment & Plan Note (Signed)
He can try the Zofran or Compazine as needed for management of nausea.

## 2020-06-30 NOTE — Assessment & Plan Note (Addendum)
Severe pain limiting all his activities of daily living from the pancreatic adenocarcinoma.   He is currently on morphine long-acting 15 mg twice daily and oxycodone every 3 hours for pain management.  Since the pain is not well controlled, I recommended that he change morphine to every 8 hours and continue oxycodone as needed for pain management.  We can consider switching to Dilaudid for short acting pain medication in the future if he has no relief.

## 2020-06-30 NOTE — Progress Notes (Signed)
Referral received from MD. Patient requesting I contact his wife by telephone.  65 year old male diagnosed with pancreas cancer scheduled for FOLFIRINOX.  Past medical history includes hypertension, stroke, MI, hyperlipidemia, total thyroidectomy, and diabetes.  Medications include Prilosec, Creon, Decadron, Lantus, Humalog, Synthroid, Imodium A-D, Ativan, Zofran, Compazine, MiraLAX, and psyllium.  Labs were reviewed.  Noted glucose 96 and albumin 3.9.  Height: 5 feet 9 inches. Weight: 185.5 pounds May 17. Usual body weight: 200 pounds on February 25. BMI: 27.39.  Patient experiences continual pain however it worsens after he eats or drinks anything. He has been mostly trying a clear liquid diet such as broth, Jell-O, coffee and tea.  Wife reports he cannot consume more than a bite or sip at a time. He has heartburn and MD added Prilosec today.   He has had some loose stools and some nausea per wife.   He does not drink milk and has been afraid to try Ensure or boost.  He did enjoy ice cream prior to diagnosis. MD proposed idea regarding feeding tube however patient adamantly refuses.  Nutrition diagnosis: Unintended weight loss related to inadequate oral intake as evidenced by 7% weight loss over 3 months which is concerning.  Intervention: Encouraged pain and nausea medications as prescribed and educated by MD. Reviewed how to take Creon for best results. Recommended bland diet and encouraged a bite or sip every hour, gradually increasing volume as tolerated. When able, recommended patient try Ensure complete providing 350 cal and 30 g protein. Encouraged patient's wife to call with questions prior to next follow-up.  Monitoring, evaluation, goals: Patient will tolerate increased oral intake to minimize further weight loss.  Next visit: Tuesday, June 14 during infusion.  **Disclaimer: This note was dictated with voice recognition software. Similar sounding words can inadvertently  be transcribed and this note may contain transcription errors which may not have been corrected upon publication of note.**

## 2020-06-30 NOTE — Assessment & Plan Note (Signed)
Weight loss, unintentional related to cancer and poor p.o. intake.  He has lost 5 pounds since his last visit.  Referral made to nutrition.  I encouraged trying nutritional supplement such as Ensure or boost and timing it when he takes his pain medication and his pain is well controlled.  We will also try Creon to help with digestion which may aid in pain control.

## 2020-06-30 NOTE — Assessment & Plan Note (Signed)
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 He continues to suffer from pain related to the cancer. He is currently on morphine for long-acting pain medicine and oxycodone for short acting pain medicine. Besides the pain, patient heartburn, some diarrhea, he has been doing okay.  His labs look satisfactory to proceed with chemotherapy tomorrow.

## 2020-06-30 NOTE — Assessment & Plan Note (Signed)
We will start him on Prilosec 20 mg p.o. daily.

## 2020-07-01 ENCOUNTER — Inpatient Hospital Stay: Payer: 59

## 2020-07-01 VITALS — BP 140/88 | HR 67 | Temp 97.7°F | Resp 18

## 2020-07-01 DIAGNOSIS — C259 Malignant neoplasm of pancreas, unspecified: Secondary | ICD-10-CM

## 2020-07-01 DIAGNOSIS — Z5111 Encounter for antineoplastic chemotherapy: Secondary | ICD-10-CM | POA: Diagnosis not present

## 2020-07-01 MED ORDER — SODIUM CHLORIDE 0.9 % IV SOLN
125.0000 mg/m2 | Freq: Once | INTRAVENOUS | Status: AC
  Administered 2020-07-01: 260 mg via INTRAVENOUS
  Filled 2020-07-01: qty 13

## 2020-07-01 MED ORDER — PALONOSETRON HCL INJECTION 0.25 MG/5ML
0.2500 mg | Freq: Once | INTRAVENOUS | Status: AC
  Administered 2020-07-01: 0.25 mg via INTRAVENOUS
  Filled 2020-07-01: qty 5

## 2020-07-01 MED ORDER — SODIUM CHLORIDE 0.9 % IV SOLN
10.0000 mg | Freq: Once | INTRAVENOUS | Status: AC
  Administered 2020-07-01: 10 mg via INTRAVENOUS
  Filled 2020-07-01: qty 1

## 2020-07-01 MED ORDER — SODIUM CHLORIDE 0.9 % IV SOLN
400.0000 mg/m2 | Freq: Once | INTRAVENOUS | Status: AC
  Administered 2020-07-01: 812 mg via INTRAVENOUS
  Filled 2020-07-01: qty 40.6

## 2020-07-01 MED ORDER — OXALIPLATIN CHEMO INJECTION 100 MG/20ML
60.0000 mg/m2 | Freq: Once | INTRAVENOUS | Status: AC
  Administered 2020-07-01: 120 mg via INTRAVENOUS
  Filled 2020-07-01: qty 24

## 2020-07-01 MED ORDER — DEXTROSE 5 % IV SOLN
Freq: Once | INTRAVENOUS | Status: AC
Start: 2020-07-01 — End: 2020-07-01
  Filled 2020-07-01: qty 250

## 2020-07-01 MED ORDER — SODIUM CHLORIDE 0.9 % IV SOLN
5000.0000 mg | INTRAVENOUS | Status: DC
  Administered 2020-07-01: 5000 mg via INTRAVENOUS
  Filled 2020-07-01: qty 100

## 2020-07-01 MED ORDER — SODIUM CHLORIDE 0.9 % IV SOLN
150.0000 mg | Freq: Once | INTRAVENOUS | Status: AC
  Administered 2020-07-01: 150 mg via INTRAVENOUS
  Filled 2020-07-01: qty 5

## 2020-07-01 MED ORDER — ATROPINE SULFATE 1 MG/ML IJ SOLN
0.5000 mg | Freq: Once | INTRAMUSCULAR | Status: AC | PRN
  Administered 2020-07-01: 0.5 mg via INTRAVENOUS
  Filled 2020-07-01: qty 1

## 2020-07-01 NOTE — Telephone Encounter (Signed)
2nd request --- Timothy Page needs to discuss asap.  [phone]

## 2020-07-01 NOTE — Telephone Encounter (Signed)
Please advise, if you would like to have pt come in office for OV or VV?

## 2020-07-01 NOTE — Telephone Encounter (Signed)
OK, I'll ween him off the clonidine and I need to see him in office to review home blood pressure measurements in order to see if we need to start a bp medication in it's place. Pls eRx clonidine 0.1mg  tabs, 1 tab po bid x 4 days, then 1 tab po qd x 4 days, then stop, #12, no RF. O/v in about 2 wks.

## 2020-07-01 NOTE — Patient Instructions (Signed)
[facility]    Discharge Instructions:  Thank you for choosing [facility] to provide your oncology and hematology care.   If you have a lab appointment with the [facility], please go directly to the [facility] and check in at the registration area.   Wear comfortable clothing and clothing appropriate for easy access to any Portacath or PICC line.   We strive to give you quality time with your provider. You may need to reschedule your appointment if you arrive late (15 or more minutes).  Arriving late affects you and other patients whose appointments are after yours.  Also, if you miss three or more appointments without notifying the office, you may be dismissed from the clinic at the provider's discretion.      For prescription refill requests, have your pharmacy contact our office and allow 72 hours for refills to be completed.    Today you received the following chemotherapy and/or immunotherapy agents Oxaliplatin (ELOXATIN), Irinotecan (CAMPTOSAR), Leucovorin & Flourouracil (ADRUCIL).     To help prevent nausea and vomiting after your treatment, we encourage you to take your nausea medication as directed.  BELOW ARE SYMPTOMS THAT SHOULD BE REPORTED IMMEDIATELY: . *FEVER GREATER THAN 100.4 F (38 C) OR HIGHER . *CHILLS OR SWEATING . *NAUSEA AND VOMITING THAT IS NOT CONTROLLED WITH YOUR NAUSEA MEDICATION . *UNUSUAL SHORTNESS OF BREATH . *UNUSUAL BRUISING OR BLEEDING . *URINARY PROBLEMS (pain or burning when urinating, or frequent urination) . *BOWEL PROBLEMS (unusual diarrhea, constipation, pain near the anus) . TENDERNESS IN MOUTH AND THROAT WITH OR WITHOUT PRESENCE OF ULCERS (sore throat, sores in mouth, or a toothache) . UNUSUAL RASH, SWELLING OR PAIN  . UNUSUAL VAGINAL DISCHARGE OR ITCHING   Items with * indicate a potential emergency and should be followed up as soon as possible or go to the Emergency Department if any problems  should occur.  Please show the CHEMOTHERAPY ALERT CARD or IMMUNOTHERAPY ALERT CARD at check-in to the Emergency Department and triage nurse.  Should you have questions after your visit or need to cancel or reschedule your appointment, please contact [facility]  Dept: [phone]  and follow the prompts.  Office hours are 8:00 a.m. to 4:30 p.m. Monday - Friday. Please note that voicemails left after 4:00 p.m. may not be returned until the following business day.  We are closed weekends and major holidays. You have access to a nurse at all times for urgent questions. Please call the main number to the clinic Dept: [phone] and follow the prompts.   For any non-urgent questions, you may also contact your provider using MyChart. We now offer e-Visits for anyone 52 and older to request care online for non-urgent symptoms. For details visit [email or web address].   Also download the MyChart app! Go to the app store, search "MyChart", open the app, select [facility], and log in with your MyChart username and password.  Due to Covid, a mask is required upon entering the hospital/clinic. If you do not have a mask, one will be given to you upon arrival. For doctor visits, patients may have 1 support person aged 13 or older with them. For treatment visits, patients cannot have anyone with them due to current Covid guidelines and our immunocompromised population.   Oxaliplatin Injection What is this medicine? OXALIPLATIN (ox AL i PLA tin) is a chemotherapy drug. It targets fast dividing cells, like cancer cells, and causes these cells to die.  This medicine is used to treat cancers of the colon and rectum, and many other cancers. This medicine may be used for other purposes; ask your health care provider or pharmacist if you have questions. COMMON BRAND NAME(S): Eloxatin What should I tell my health care provider before I take this medicine? They need to know if you have any  of these conditions:  heart disease  history of irregular heartbeat  liver disease  low blood counts, like white cells, platelets, or red blood cells  lung or breathing disease, like asthma  take medicines that treat or prevent blood clots  tingling of the fingers or toes, or other nerve disorder  an unusual or allergic reaction to oxaliplatin, other chemotherapy, other medicines, foods, dyes, or preservatives  pregnant or trying to get pregnant  breast-feeding How should I use this medicine? This drug is given as an infusion into a vein. It is administered in a hospital or clinic by a specially trained health care professional. Talk to your pediatrician regarding the use of this medicine in children. Special care may be needed. Overdosage: If you think you have taken too much of this medicine contact a poison control center or emergency room at once. NOTE: This medicine is only for you. Do not share this medicine with others. What if I miss a dose? It is important not to miss a dose. Call your doctor or health care professional if you are unable to keep an appointment. What may interact with this medicine? Do not take this medicine with any of the following medications:  cisapride  dronedarone  pimozide  thioridazine This medicine may also interact with the following medications:  aspirin and aspirin-like medicines  certain medicines that treat or prevent blood clots like warfarin, apixaban, dabigatran, and rivaroxaban  cisplatin  cyclosporine  diuretics  medicines for infection like acyclovir, adefovir, amphotericin B, bacitracin, cidofovir, foscarnet, ganciclovir, gentamicin, pentamidine, vancomycin  NSAIDs, medicines for pain and inflammation, like ibuprofen or naproxen  other medicines that prolong the QT interval (an abnormal heart rhythm)  pamidronate  zoledronic acid This list may not describe all possible interactions. Give your health care provider  a list of all the medicines, herbs, non-prescription drugs, or dietary supplements you use. Also tell them if you smoke, drink alcohol, or use illegal drugs. Some items may interact with your medicine. What should I watch for while using this medicine? Your condition will be monitored carefully while you are receiving this medicine. You may need blood work done while you are taking this medicine. This medicine may make you feel generally unwell. This is not uncommon as chemotherapy can affect healthy cells as well as cancer cells. Report any side effects. Continue your course of treatment even though you feel ill unless your healthcare professional tells you to stop. This medicine can make you more sensitive to cold. Do not drink cold drinks or use ice. Cover exposed skin before coming in contact with cold temperatures or cold objects. When out in cold weather wear warm clothing and cover your mouth and nose to warm the air that goes into your lungs. Tell your doctor if you get sensitive to the cold. Do not become pregnant while taking this medicine or for 9 months after stopping it. Women should inform their health care professional if they wish to become pregnant or think they might be pregnant. Men should not father a child while taking this medicine and for 6 months after stopping it. There is potential for serious  side effects to an unborn child. Talk to your health care professional for more information. Do not breast-feed a child while taking this medicine or for 3 months after stopping it. This medicine has caused ovarian failure in some women. This medicine may make it more difficult to get pregnant. Talk to your health care professional if you are concerned about your fertility. This medicine has caused decreased sperm counts in some men. This may make it more difficult to father a child. Talk to your health care professional if you are concerned about your fertility. This medicine may increase  your risk of getting an infection. Call your health care professional for advice if you get a fever, chills, or sore throat, or other symptoms of a cold or flu. Do not treat yourself. Try to avoid being around people who are sick. Avoid taking medicines that contain aspirin, acetaminophen, ibuprofen, naproxen, or ketoprofen unless instructed by your health care professional. These medicines may hide a fever. Be careful brushing or flossing your teeth or using a toothpick because you may get an infection or bleed more easily. If you have any dental work done, tell your dentist you are receiving this medicine. What side effects may I notice from receiving this medicine? Side effects that you should report to your doctor or health care professional as soon as possible:  allergic reactions like skin rash, itching or hives, swelling of the face, lips, or tongue  breathing problems  cough  low blood counts - this medicine may decrease the number of white blood cells, red blood cells, and platelets. You may be at increased risk for infections and bleeding  nausea, vomiting  pain, redness, or irritation at site where injected  pain, tingling, numbness in the hands or feet  signs and symptoms of bleeding such as bloody or black, tarry stools; red or dark brown urine; spitting up blood or brown material that looks like coffee grounds; red spots on the skin; unusual bruising or bleeding from the eyes, gums, or nose  signs and symptoms of a dangerous change in heartbeat or heart rhythm like chest pain; dizziness; fast, irregular heartbeat; palpitations; feeling faint or lightheaded; falls  signs and symptoms of infection like fever; chills; cough; sore throat; pain or trouble passing urine  signs and symptoms of liver injury like dark yellow or brown urine; general ill feeling or flu-like symptoms; light-colored stools; loss of appetite; nausea; right upper belly pain; unusually weak or tired;  yellowing of the eyes or skin  signs and symptoms of low red blood cells or anemia such as unusually weak or tired; feeling faint or lightheaded; falls  signs and symptoms of muscle injury like dark urine; trouble passing urine or change in the amount of urine; unusually weak or tired; muscle pain; back pain Side effects that usually do not require medical attention (report to your doctor or health care professional if they continue or are bothersome):  changes in taste  diarrhea  gas  hair loss  loss of appetite  mouth sores This list may not describe all possible side effects. Call your doctor for medical advice about side effects. You may report side effects to FDA at 1-800-FDA-1088. Where should I keep my medicine? This drug is given in a hospital or clinic and will not be stored at home. NOTE: This sheet is a summary. It may not cover all possible information. If you have questions about this medicine, talk to your doctor, pharmacist, or health care provider.  2021 Elsevier/Gold Standard (2018-06-20 12:20:35)  Irinotecan injection What is this medicine? IRINOTECAN (ir in oh TEE kan ) is a chemotherapy drug. It is used to treat colon and rectal cancer. This medicine may be used for other purposes; ask your health care provider or pharmacist if you have questions. COMMON BRAND NAME(S): Camptosar What should I tell my health care provider before I take this medicine? They need to know if you have any of these conditions:  dehydration  diarrhea  infection (especially a virus infection such as chickenpox, cold sores, or herpes)  liver disease  low blood counts, like low white cell, platelet, or red cell counts  low levels of calcium, magnesium, or potassium in the blood  recent or ongoing radiation therapy  an unusual or allergic reaction to irinotecan, other medicines, foods, dyes, or preservatives  pregnant or trying to get pregnant  breast-feeding How should I  use this medicine? This drug is given as an infusion into a vein. It is administered in a hospital or clinic by a specially trained health care professional. Talk to your pediatrician regarding the use of this medicine in children. Special care may be needed. Overdosage: If you think you have taken too much of this medicine contact a poison control center or emergency room at once. NOTE: This medicine is only for you. Do not share this medicine with others. What if I miss a dose? It is important not to miss your dose. Call your doctor or health care professional if you are unable to keep an appointment. What may interact with this medicine? Do not take this medicine with any of the following medications:  cobicistat  itraconazole This medicine may interact with the following medications:  antiviral medicines for HIV or AIDS  certain antibiotics like rifampin or rifabutin  certain medicines for fungal infections like ketoconazole, posaconazole, and voriconazole  certain medicines for seizures like carbamazepine, phenobarbital, phenotoin  clarithromycin  gemfibrozil  nefazodone  St. John's Wort This list may not describe all possible interactions. Give your health care provider a list of all the medicines, herbs, non-prescription drugs, or dietary supplements you use. Also tell them if you smoke, drink alcohol, or use illegal drugs. Some items may interact with your medicine. What should I watch for while using this medicine? Your condition will be monitored carefully while you are receiving this medicine. You will need important blood work done while you are taking this medicine. This drug may make you feel generally unwell. This is not uncommon, as chemotherapy can affect healthy cells as well as cancer cells. Report any side effects. Continue your course of treatment even though you feel ill unless your doctor tells you to stop. In some cases, you may be given additional medicines  to help with side effects. Follow all directions for their use. You may get drowsy or dizzy. Do not drive, use machinery, or do anything that needs mental alertness until you know how this medicine affects you. Do not stand or sit up quickly, especially if you are an older patient. This reduces the risk of dizzy or fainting spells. Call your health care professional for advice if you get a fever, chills, or sore throat, or other symptoms of a cold or flu. Do not treat yourself. This medicine decreases your body's ability to fight infections. Try to avoid being around people who are sick. Avoid taking products that contain aspirin, acetaminophen, ibuprofen, naproxen, or ketoprofen unless instructed by your doctor. These medicines may hide a  fever. This medicine may increase your risk to bruise or bleed. Call your doctor or health care professional if you notice any unusual bleeding. Be careful brushing and flossing your teeth or using a toothpick because you may get an infection or bleed more easily. If you have any dental work done, tell your dentist you are receiving this medicine. Do not become pregnant while taking this medicine or for 6 months after stopping it. Women should inform their health care professional if they wish to become pregnant or think they might be pregnant. Men should not father a child while taking this medicine and for 3 months after stopping it. There is potential for serious side effects to an unborn child. Talk to your health care professional for more information. Do not breast-feed an infant while taking this medicine or for 7 days after stopping it. This medicine has caused ovarian failure in some women. This medicine may make it more difficult to get pregnant. Talk to your health care professional if you are concerned about your fertility. This medicine has caused decreased sperm counts in some men. This may make it more difficult to father a child. Talk to your health care  professional if you are concerned about your fertility. What side effects may I notice from receiving this medicine? Side effects that you should report to your doctor or health care professional as soon as possible:  allergic reactions like skin rash, itching or hives, swelling of the face, lips, or tongue  chest pain  diarrhea  flushing, runny nose, sweating during infusion  low blood counts - this medicine may decrease the number of white blood cells, red blood cells and platelets. You may be at increased risk for infections and bleeding.  nausea, vomiting  pain, swelling, warmth in the leg  signs of decreased platelets or bleeding - bruising, pinpoint red spots on the skin, black, tarry stools, blood in the urine  signs of infection - fever or chills, cough, sore throat, pain or difficulty passing urine  signs of decreased red blood cells - unusually weak or tired, fainting spells, lightheadedness Side effects that usually do not require medical attention (report to your doctor or health care professional if they continue or are bothersome):  constipation  hair loss  headache  loss of appetite  mouth sores  stomach pain This list may not describe all possible side effects. Call your doctor for medical advice about side effects. You may report side effects to FDA at 1-800-FDA-1088. Where should I keep my medicine? This drug is given in a hospital or clinic and will not be stored at home. NOTE: This sheet is a summary. It may not cover all possible information. If you have questions about this medicine, talk to your doctor, pharmacist, or health care provider.  2021 Elsevier/Gold Standard (2019-01-01 17:46:13)  Leucovorin injection What is this medicine? LEUCOVORIN (loo koe VOR in) is used to prevent or treat the harmful effects of some medicines. This medicine is used to treat anemia caused by a low amount of folic acid in the body. It is also used with 5-fluorouracil  (5-FU) to treat colon cancer. This medicine may be used for other purposes; ask your health care provider or pharmacist if you have questions. What should I tell my health care provider before I take this medicine? They need to know if you have any of these conditions:  anemia from low levels of vitamin B-12 in the blood  an unusual or allergic reaction to  leucovorin, folic acid, other medicines, foods, dyes, or preservatives  pregnant or trying to get pregnant  breast-feeding How should I use this medicine? This medicine is for injection into a muscle or into a vein. It is given by a health care professional in a hospital or clinic setting. Talk to your pediatrician regarding the use of this medicine in children. Special care may be needed. Overdosage: If you think you have taken too much of this medicine contact a poison control center or emergency room at once. NOTE: This medicine is only for you. Do not share this medicine with others. What if I miss a dose? This does not apply. What may interact with this medicine?  capecitabine  fluorouracil  phenobarbital  phenytoin  primidone  trimethoprim-sulfamethoxazole This list may not describe all possible interactions. Give your health care provider a list of all the medicines, herbs, non-prescription drugs, or dietary supplements you use. Also tell them if you smoke, drink alcohol, or use illegal drugs. Some items may interact with your medicine. What should I watch for while using this medicine? Your condition will be monitored carefully while you are receiving this medicine. This medicine may increase the side effects of 5-fluorouracil, 5-FU. Tell your doctor or health care professional if you have diarrhea or mouth sores that do not get better or that get worse. What side effects may I notice from receiving this medicine? Side effects that you should report to your doctor or health care professional as soon as  possible:  allergic reactions like skin rash, itching or hives, swelling of the face, lips, or tongue  breathing problems  fever, infection  mouth sores  unusual bleeding or bruising  unusually weak or tired Side effects that usually do not require medical attention (report to your doctor or health care professional if they continue or are bothersome):  constipation or diarrhea  loss of appetite  nausea, vomiting This list may not describe all possible side effects. Call your doctor for medical advice about side effects. You may report side effects to FDA at 1-800-FDA-1088. Where should I keep my medicine? This drug is given in a hospital or clinic and will not be stored at home. NOTE: This sheet is a summary. It may not cover all possible information. If you have questions about this medicine, talk to your doctor, pharmacist, or health care provider.  2021 Elsevier/Gold Standard (2007-08-07 16:50:29)  Fluorouracil, 5-FU injection What is this medicine? FLUOROURACIL, 5-FU (flure oh YOOR a sil) is a chemotherapy drug. It slows the growth of cancer cells. This medicine is used to treat many types of cancer like breast cancer, colon or rectal cancer, pancreatic cancer, and stomach cancer. This medicine may be used for other purposes; ask your health care provider or pharmacist if you have questions. COMMON BRAND NAME(S): Adrucil What should I tell my health care provider before I take this medicine? They need to know if you have any of these conditions:  blood disorders  dihydropyrimidine dehydrogenase (DPD) deficiency  infection (especially a virus infection such as chickenpox, cold sores, or herpes)  kidney disease  liver disease  malnourished, poor nutrition  recent or ongoing radiation therapy  an unusual or allergic reaction to fluorouracil, other chemotherapy, other medicines, foods, dyes, or preservatives  pregnant or trying to get  pregnant  breast-feeding How should I use this medicine? This drug is given as an infusion or injection into a vein. It is administered in a hospital or clinic by a specially trained  health care professional. Talk to your pediatrician regarding the use of this medicine in children. Special care may be needed. Overdosage: If you think you have taken too much of this medicine contact a poison control center or emergency room at once. NOTE: This medicine is only for you. Do not share this medicine with others. What if I miss a dose? It is important not to miss your dose. Call your doctor or health care professional if you are unable to keep an appointment. What may interact with this medicine? Do not take this medicine with any of the following medications:  live virus vaccines This medicine may also interact with the following medications:  medicines that treat or prevent blood clots like warfarin, enoxaparin, and dalteparin This list may not describe all possible interactions. Give your health care provider a list of all the medicines, herbs, non-prescription drugs, or dietary supplements you use. Also tell them if you smoke, drink alcohol, or use illegal drugs. Some items may interact with your medicine. What should I watch for while using this medicine? Visit your doctor for checks on your progress. This drug may make you feel generally unwell. This is not uncommon, as chemotherapy can affect healthy cells as well as cancer cells. Report any side effects. Continue your course of treatment even though you feel ill unless your doctor tells you to stop. In some cases, you may be given additional medicines to help with side effects. Follow all directions for their use. Call your doctor or health care professional for advice if you get a fever, chills or sore throat, or other symptoms of a cold or flu. Do not treat yourself. This drug decreases your body's ability to fight infections. Try to avoid  being around people who are sick. This medicine may increase your risk to bruise or bleed. Call your doctor or health care professional if you notice any unusual bleeding. Be careful brushing and flossing your teeth or using a toothpick because you may get an infection or bleed more easily. If you have any dental work done, tell your dentist you are receiving this medicine. Avoid taking products that contain aspirin, acetaminophen, ibuprofen, naproxen, or ketoprofen unless instructed by your doctor. These medicines may hide a fever. Do not become pregnant while taking this medicine. Women should inform their doctor if they wish to become pregnant or think they might be pregnant. There is a potential for serious side effects to an unborn child. Talk to your health care professional or pharmacist for more information. Do not breast-feed an infant while taking this medicine. Men should inform their doctor if they wish to father a child. This medicine may lower sperm counts. Do not treat diarrhea with over the counter products. Contact your doctor if you have diarrhea that lasts more than 2 days or if it is severe and watery. This medicine can make you more sensitive to the sun. Keep out of the sun. If you cannot avoid being in the sun, wear protective clothing and use sunscreen. Do not use sun lamps or tanning beds/booths. What side effects may I notice from receiving this medicine? Side effects that you should report to your doctor or health care professional as soon as possible:  allergic reactions like skin rash, itching or hives, swelling of the face, lips, or tongue  low blood counts - this medicine may decrease the number of white blood cells, red blood cells and platelets. You may be at increased risk for infections and bleeding.  signs of infection - fever or chills, cough, sore throat, pain or difficulty passing urine  signs of decreased platelets or bleeding - bruising, pinpoint red spots on  the skin, black, tarry stools, blood in the urine  signs of decreased red blood cells - unusually weak or tired, fainting spells, lightheadedness  breathing problems  changes in vision  chest pain  mouth sores  nausea and vomiting  pain, swelling, redness at site where injected  pain, tingling, numbness in the hands or feet  redness, swelling, or sores on hands or feet  stomach pain  unusual bleeding Side effects that usually do not require medical attention (report to your doctor or health care professional if they continue or are bothersome):  changes in finger or toe nails  diarrhea  dry or itchy skin  hair loss  headache  loss of appetite  sensitivity of eyes to the light  stomach upset  unusually teary eyes This list may not describe all possible side effects. Call your doctor for medical advice about side effects. You may report side effects to FDA at 1-800-FDA-1088. Where should I keep my medicine? This drug is given in a hospital or clinic and will not be stored at home. NOTE: This sheet is a summary. It may not cover all possible information. If you have questions about this medicine, talk to your doctor, pharmacist, or health care provider.  2021 Elsevier/Gold Standard (2019-01-01 15:00:03)

## 2020-07-02 ENCOUNTER — Encounter: Payer: Self-pay | Admitting: Genetic Counselor

## 2020-07-02 ENCOUNTER — Other Ambulatory Visit: Payer: Self-pay

## 2020-07-02 ENCOUNTER — Telehealth: Payer: Self-pay | Admitting: *Deleted

## 2020-07-02 DIAGNOSIS — Z1379 Encounter for other screening for genetic and chromosomal anomalies: Secondary | ICD-10-CM | POA: Insufficient documentation

## 2020-07-02 MED ORDER — CLONIDINE HCL 0.1 MG PO TABS: ORAL_TABLET | ORAL | 0 refills | Status: DC

## 2020-07-02 NOTE — Telephone Encounter (Signed)
Spoke with pt's wife, Precious Bard regarding recommendations. Rx sent to the pharmacy. She will call back to schedule 2 week f/u

## 2020-07-03 ENCOUNTER — Inpatient Hospital Stay: Payer: 59

## 2020-07-03 ENCOUNTER — Other Ambulatory Visit: Payer: Self-pay

## 2020-07-03 VITALS — BP 124/80 | HR 66 | Temp 98.4°F | Resp 20

## 2020-07-03 DIAGNOSIS — C259 Malignant neoplasm of pancreas, unspecified: Secondary | ICD-10-CM

## 2020-07-03 DIAGNOSIS — Z5111 Encounter for antineoplastic chemotherapy: Secondary | ICD-10-CM | POA: Diagnosis not present

## 2020-07-03 MED ORDER — HEPARIN SOD (PORK) LOCK FLUSH 100 UNIT/ML IV SOLN
500.0000 [IU] | Freq: Once | INTRAVENOUS | Status: AC | PRN
  Administered 2020-07-03: 500 [IU]
  Filled 2020-07-03: qty 5

## 2020-07-03 MED ORDER — SODIUM CHLORIDE 0.9% FLUSH
10.0000 mL | INTRAVENOUS | Status: DC | PRN
  Administered 2020-07-03: 10 mL
  Filled 2020-07-03: qty 10

## 2020-07-03 NOTE — Patient Instructions (Signed)
Timothy Page  Discharge Instructions: Thank you for choosing [facility] to provide your oncology and hematology care.   If you have a lab appointment with the [facility], please go directly to the [facility] and check in at the registration area.   Wear comfortable clothing and clothing appropriate for easy access to any Portacath or PICC line.   We strive to give you quality time with your provider. You may need to reschedule your appointment if you arrive late (15 or more minutes).  Arriving late affects you and other patients whose appointments are after yours.  Also, if you miss three or more appointments without notifying the office, you may be dismissed from the clinic at the provider's discretion.      For prescription refill requests, have your pharmacy contact our office and allow 72 hours for refills to be completed.    Today you received the following chemotherapy and/or immunotherapy agents: 44fu/fluorouracil   To help prevent nausea and vomiting after your treatment, we encourage you to take your nausea medication as directed.  BELOW ARE SYMPTOMS THAT SHOULD BE REPORTED IMMEDIATELY: . *FEVER GREATER THAN 100.4 F (38 C) OR HIGHER . *CHILLS OR SWEATING . *NAUSEA AND VOMITING THAT IS NOT CONTROLLED WITH YOUR NAUSEA MEDICATION . *UNUSUAL SHORTNESS OF BREATH . *UNUSUAL BRUISING OR BLEEDING . *URINARY PROBLEMS (pain or burning when urinating, or frequent urination) . *BOWEL PROBLEMS (unusual diarrhea, constipation, pain near the anus) . TENDERNESS IN MOUTH AND THROAT WITH OR WITHOUT PRESENCE OF ULCERS (sore throat, sores in mouth, or a toothache) . UNUSUAL RASH, SWELLING OR PAIN  . UNUSUAL VAGINAL DISCHARGE OR ITCHING   Items with * indicate a potential emergency and should be followed up as soon as possible or go to the Emergency Department if any problems should occur.  Please show the CHEMOTHERAPY ALERT CARD or IMMUNOTHERAPY ALERT  CARD at check-in to the Emergency Department and triage nurse.  Should you have questions after your visit or need to cancel or reschedule your appointment, please contact [facility]  Dept: [phone]  and follow the prompts.  Office hours are 8:00 a.m. to 4:30 p.m. Monday - Friday. Please note that voicemails left after 4:00 p.m. may not be returned until the following business day.  We are closed weekends and major holidays. You have access to a nurse at all times for urgent questions. Please call the main number to the clinic Dept: [phone] and follow the prompts.   For any non-urgent questions, you may also contact your provider using MyChart. We now offer e-Visits for anyone 49 and older to request care online for non-urgent symptoms. For details visit [email or web address].   Also download the MyChart app! Go to the app store, search "MyChart", open the app, select [facility], and log in with your MyChart username and password.  Due to Covid, a mask is required upon entering the hospital/clinic. If you do not have a mask, one will be given to you upon arrival. For doctor visits, patients may have 1 support person aged 46 or older with them. For treatment visits, patients cannot have anyone with them due to current Covid guidelines and our immunocompromised population.   Fluorouracil, 5-FU injection What is this medicine? FLUOROURACIL, 5-FU (flure oh YOOR a sil) is a chemotherapy drug. It slows the growth of cancer cells. This medicine is used to treat many types of cancer like breast cancer, colon or rectal cancer,  pancreatic cancer, and stomach cancer. This medicine may be used for other purposes; ask your health care provider or pharmacist if you have questions. COMMON BRAND NAME(S): Adrucil What should I tell my health care provider before I take this medicine? They need to know if you have any of these conditions:  blood disorders  dihydropyrimidine  dehydrogenase (DPD) deficiency  infection (especially a virus infection such as chickenpox, cold sores, or herpes)  kidney disease  liver disease  malnourished, poor nutrition  recent or ongoing radiation therapy  an unusual or allergic reaction to fluorouracil, other chemotherapy, other medicines, foods, dyes, or preservatives  pregnant or trying to get pregnant  breast-feeding How should I use this medicine? This drug is given as an infusion or injection into a vein. It is administered in a hospital or clinic by a specially trained health care professional. Talk to your pediatrician regarding the use of this medicine in children. Special care may be needed. Overdosage: If you think you have taken too much of this medicine contact a poison control center or emergency room at once. NOTE: This medicine is only for you. Do not share this medicine with others. What if I miss a dose? It is important not to miss your dose. Call your doctor or health care professional if you are unable to keep an appointment. What may interact with this medicine? Do not take this medicine with any of the following medications:  live virus vaccines This medicine may also interact with the following medications:  medicines that treat or prevent blood clots like warfarin, enoxaparin, and dalteparin This list may not describe all possible interactions. Give your health care provider a list of all the medicines, herbs, non-prescription drugs, or dietary supplements you use. Also tell them if you smoke, drink alcohol, or use illegal drugs. Some items may interact with your medicine. What should I watch for while using this medicine? Visit your doctor for checks on your progress. This drug may make you feel generally unwell. This is not uncommon, as chemotherapy can affect healthy cells as well as cancer cells. Report any side effects. Continue your course of treatment even though you feel ill unless your doctor  tells you to stop. In some cases, you may be given additional medicines to help with side effects. Follow all directions for their use. Call your doctor or health care professional for advice if you get a fever, chills or sore throat, or other symptoms of a cold or flu. Do not treat yourself. This drug decreases your body's ability to fight infections. Try to avoid being around people who are sick. This medicine may increase your risk to bruise or bleed. Call your doctor or health care professional if you notice any unusual bleeding. Be careful brushing and flossing your teeth or using a toothpick because you may get an infection or bleed more easily. If you have any dental work done, tell your dentist you are receiving this medicine. Avoid taking products that contain aspirin, acetaminophen, ibuprofen, naproxen, or ketoprofen unless instructed by your doctor. These medicines may hide a fever. Do not become pregnant while taking this medicine. Women should inform their doctor if they wish to become pregnant or think they might be pregnant. There is a potential for serious side effects to an unborn child. Talk to your health care professional or pharmacist for more information. Do not breast-feed an infant while taking this medicine. Men should inform their doctor if they wish to father a child. This  medicine may lower sperm counts. Do not treat diarrhea with over the counter products. Contact your doctor if you have diarrhea that lasts more than 2 days or if it is severe and watery. This medicine can make you more sensitive to the sun. Keep out of the sun. If you cannot avoid being in the sun, wear protective clothing and use sunscreen. Do not use sun lamps or tanning beds/booths. What side effects may I notice from receiving this medicine? Side effects that you should report to your doctor or health care professional as soon as possible:  allergic reactions like skin rash, itching or hives, swelling of  the face, lips, or tongue  low blood counts - this medicine may decrease the number of white blood cells, red blood cells and platelets. You may be at increased risk for infections and bleeding.  signs of infection - fever or chills, cough, sore throat, pain or difficulty passing urine  signs of decreased platelets or bleeding - bruising, pinpoint red spots on the skin, black, tarry stools, blood in the urine  signs of decreased red blood cells - unusually weak or tired, fainting spells, lightheadedness  breathing problems  changes in vision  chest pain  mouth sores  nausea and vomiting  pain, swelling, redness at site where injected  pain, tingling, numbness in the hands or feet  redness, swelling, or sores on hands or feet  stomach pain  unusual bleeding Side effects that usually do not require medical attention (report to your doctor or health care professional if they continue or are bothersome):  changes in finger or toe nails  diarrhea  dry or itchy skin  hair loss  headache  loss of appetite  sensitivity of eyes to the light  stomach upset  unusually teary eyes This list may not describe all possible side effects. Call your doctor for medical advice about side effects. You may report side effects to FDA at 1-800-FDA-1088. Where should I keep my medicine? This drug is given in a hospital or clinic and will not be stored at home. NOTE: This sheet is a summary. It may not cover all possible information. If you have questions about this medicine, talk to your doctor, pharmacist, or health care provider.  2021 Elsevier/Gold Standard (2019-01-01 15:00:03)

## 2020-07-06 ENCOUNTER — Other Ambulatory Visit: Payer: Self-pay

## 2020-07-06 ENCOUNTER — Emergency Department (HOSPITAL_COMMUNITY): Payer: 59

## 2020-07-06 ENCOUNTER — Encounter (HOSPITAL_COMMUNITY): Payer: Self-pay

## 2020-07-06 ENCOUNTER — Emergency Department (HOSPITAL_COMMUNITY)
Admission: EM | Admit: 2020-07-06 | Discharge: 2020-07-07 | Disposition: A | Discharge status: Home / Self Care | Payer: 59 | Attending: Emergency Medicine | Admitting: Emergency Medicine

## 2020-07-06 ENCOUNTER — Other Ambulatory Visit: Payer: Self-pay | Admitting: Hematology and Oncology

## 2020-07-06 ENCOUNTER — Telehealth: Payer: Self-pay

## 2020-07-06 DIAGNOSIS — Z794 Long term (current) use of insulin: Secondary | ICD-10-CM | POA: Diagnosis not present

## 2020-07-06 DIAGNOSIS — C259 Malignant neoplasm of pancreas, unspecified: Secondary | ICD-10-CM

## 2020-07-06 DIAGNOSIS — Z79899 Other long term (current) drug therapy: Secondary | ICD-10-CM | POA: Insufficient documentation

## 2020-07-06 DIAGNOSIS — R1012 Left upper quadrant pain: Secondary | ICD-10-CM | POA: Diagnosis present

## 2020-07-06 DIAGNOSIS — I10 Essential (primary) hypertension: Secondary | ICD-10-CM | POA: Diagnosis not present

## 2020-07-06 DIAGNOSIS — E119 Type 2 diabetes mellitus without complications: Secondary | ICD-10-CM | POA: Diagnosis not present

## 2020-07-06 DIAGNOSIS — I2510 Atherosclerotic heart disease of native coronary artery without angina pectoris: Secondary | ICD-10-CM | POA: Insufficient documentation

## 2020-07-06 LAB — CBC WITH DIFFERENTIAL/PLATELET
Abs Immature Granulocytes: 0.02 10*3/uL (ref 0.00–0.07)
Basophils Absolute: 0 10*3/uL (ref 0.0–0.1)
Basophils Relative: 0 %
Eosinophils Absolute: 0.1 10*3/uL (ref 0.0–0.5)
Eosinophils Relative: 1 %
HCT: 37.3 % — ABNORMAL LOW (ref 39.0–52.0)
Hemoglobin: 12.9 g/dL — ABNORMAL LOW (ref 13.0–17.0)
Immature Granulocytes: 0 %
Lymphocytes Relative: 11 %
Lymphs Abs: 0.8 10*3/uL (ref 0.7–4.0)
MCH: 30.5 pg (ref 26.0–34.0)
MCHC: 34.6 g/dL (ref 30.0–36.0)
MCV: 88.2 fL (ref 80.0–100.0)
Monocytes Absolute: 0.1 10*3/uL (ref 0.1–1.0)
Monocytes Relative: 2 %
Neutro Abs: 6.3 10*3/uL (ref 1.7–7.7)
Neutrophils Relative %: 86 %
Platelets: 154 10*3/uL (ref 150–400)
RBC: 4.23 MIL/uL (ref 4.22–5.81)
RDW: 11.9 % (ref 11.5–15.5)
WBC: 7.4 10*3/uL (ref 4.0–10.5)
nRBC: 0 % (ref 0.0–0.2)

## 2020-07-06 LAB — COMPREHENSIVE METABOLIC PANEL
ALT: 19 U/L (ref 0–44)
AST: 15 U/L (ref 15–41)
Albumin: 4.1 g/dL (ref 3.5–5.0)
Alkaline Phosphatase: 65 U/L (ref 38–126)
Anion gap: 8 (ref 5–15)
BUN: 16 mg/dL (ref 8–23)
CO2: 24 mmol/L (ref 22–32)
Calcium: 9.1 mg/dL (ref 8.9–10.3)
Chloride: 101 mmol/L (ref 98–111)
Creatinine, Ser: 0.95 mg/dL (ref 0.61–1.24)
GFR, Estimated: >60 mL/min (ref >60)
Glucose, Bld: 268 mg/dL — ABNORMAL HIGH (ref 70–99)
Potassium: 4.2 mmol/L (ref 3.5–5.1)
Sodium: 133 mmol/L — ABNORMAL LOW (ref 135–145)
Total Bilirubin: 0.5 mg/dL (ref 0.3–1.2)
Total Protein: 7.2 g/dL (ref 6.5–8.1)

## 2020-07-06 LAB — LIPASE, BLOOD: Lipase: 20 U/L (ref 11–51)

## 2020-07-06 MED ORDER — MORPHINE SULFATE ER 15 MG PO TBCR: 15.0000 mg | EXTENDED_RELEASE_TABLET | Freq: Two times a day (BID) | ORAL | 0 refills | Status: DC

## 2020-07-06 MED ORDER — HYDROMORPHONE HCL 1 MG/ML IJ SOLN
1.0000 mg | Freq: Once | INTRAMUSCULAR | Status: AC
  Administered 2020-07-06: 1 mg via INTRAVENOUS
  Filled 2020-07-06: qty 1

## 2020-07-06 MED ORDER — ONDANSETRON HCL 4 MG/2ML IJ SOLN
4.0000 mg | Freq: Once | INTRAMUSCULAR | Status: AC
  Administered 2020-07-06: 4 mg via INTRAVENOUS
  Filled 2020-07-06: qty 2

## 2020-07-06 MED ORDER — SODIUM CHLORIDE 0.9 % IV BOLUS
500.0000 mL | Freq: Once | INTRAVENOUS | Status: AC
  Administered 2020-07-06: 500 mL via INTRAVENOUS

## 2020-07-06 NOTE — ED Triage Notes (Addendum)
Patient reports that he has pancreatic cancer and is currently receiving chemo treatments.   patient c/o upper abdominal pain and back pain since yesterday. patient states he is having a loose stool, but that is not new.

## 2020-07-06 NOTE — ED Provider Notes (Signed)
Emergency Medicine Provider Triage Evaluation Note  Timothy Page 65 y.o. male was evaluated in triage.  Pt complains of abd pain that began yesterday.  Patient reports history of pancreatic cancer.  Last treatment was about 3 days ago.  States yesterday he started developing some upper abdominal pain.  Is worse in the left upper quadrant.  He has had some loose stools.  No vomiting.  No fevers.  No dysuria, hematuria.   Review of Systems  Positive: Abdominal pain, diarrhea Negative: Fever, vomiting, dysuria, hematuria.  Physical Exam  BP 134/82   Pulse 70   Temp 98.2 F (36.8 C) (Oral)   Resp 18   Ht 5\' 4"  (1.626 m)   Wt 65.8 kg   SpO2 100%   BMI 24.89 kg/m  Gen:   Awake, no distress   HEENT:  Atraumatic  Resp:  Normal effort  Cardiac:  Normal rate  Abd:   Nondistended, tenderness palpation noted upper abdomen.  No rigidity, guarding. MSK:   Moves extremities without difficulty  Neuro:  Speech clear   Other:      Medical Decision Making  Medically screening exam initiated at 6:27 PM  Appropriate orders placed.  [name] was informed that the remainder of the evaluation will be completed by another provider, this initial triage assessment does not replace that evaluation. They are counseled that they will need to remain in the ED until the completion of their workup, including full H&P and results of any tests.  Risks of leaving the emergency department prior to completion of treatment were discussed. Patient was advised to inform ED staff if they are leaving before their treatment is complete. The patient acknowledged these risks and time was allowed for questions.     The patient appears stable so that the remainder of the MSE may be completed by another provider.    Clinical Impression  Abdominal pain.   Portions of this note were generated with [employer]. Dictation errors may occur despite best attempts at proofreading.      [name],  PA-C 07/06/20 1828    [name], MD 07/06/20 [phone]

## 2020-07-06 NOTE — Progress Notes (Signed)
Morphine refilled

## 2020-07-06 NOTE — Telephone Encounter (Signed)
Spoke with patient's wife regarding medications. Informed her that Dr Chryl Heck refilled patient's MS Contin and called in [facility] for mouth sores. Verbalized understanding.

## 2020-07-06 NOTE — ED Provider Notes (Signed)
[facility] DEPT Provider Note   CSN: [id number] Arrival date & time: 07/06/20  1805     History Chief Complaint  Patient presents with  . Abdominal Pain    Timothy Page is a 65 y.o. 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 Patient reports pain is to the LUQ, it is constant, very uncomfortable, mildly alleviated temporarily with his oral morphine, his oxycodone is not helping much therefore he has not been taking it for this. No other alleviating/aggravating factors. Has associated nausea without vomiting and has had loose stool. Denies fever, chills, vomiting, melena, dysuria, chest pain, dyspnea, or cough.   Patient being followed by oncology, IR port inserted 06/22/20, started cycle 1 of Folfirinox 07/01/20, last tx 3 days prior. Currently on morphine for long acting pain medicine & oxycodone for short acting pain medicine.    HPI     Past Medical History:  Diagnosis Date  . Arthritis   . Atypical chest pain 04/2017   ACS ruled out 05/11/17  . CAP (community acquired pneumonia) 05/07/2017  . Diabetes mellitus without complication ([place])    Poor control starting fall 2021-->GAD ab neg.  Insulin and C peptide levels wnl but on lower end of normal->Dr Kuma/endo 2022.  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 Hx of adenomatous polyp of colon 07/02/2009   rpt colonoscopy 2026  . Hyperlipidemia    Intol of multiple statins and zetia. [facility] 2022.  [name] Hypertension   . Lipoma    back  . Myocardial infarction ([place])    MI at age 84  . Pancreatic adenocarcinoma ([place]) 06/2020   Mets to liver (?and lungs?), w/acute pancreatitis.  [name] Perianal abscess 11/2019   I&D'd by Dr.Thompson, gen surg, in the ED  11/23/19  . 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 Cammy Copa  . Right ureteral calculus 04/2018   Cystoscopy with right retrograde pyelogram and right ureteral stent placement after stone removal.  . Seasonal allergies   . Stroke ([place])    in 74Q no complications   . Tachyarrhythmia 1980   age 88 X 2 over 71 month period.  No recurrence since that time.  . Thoracic aortic aneurysm ([place])    Incidentally discovered, 4.1 cm-->Dr. Cyndia Bent. Rpt CT 08/2018 and 08/2019 stable.  . Vocal cord paralysis 07/2016   Left vocal cord paralysis noted after thyroid surgery.    Patient Active Problem List   Diagnosis Date Noted  . Genetic testing 07/02/2020  . Heart burn 06/30/2020  . Cancer related pain 06/30/2020  . Adverse drug interaction with prescription medication 06/30/2020  . Weight loss, unintentional 06/30/2020  . Adenocarcinoma of pancreas, stage 4 ([place]) 06/20/2020  . Dilation of pancreatic duct   . Pancreatic adenocarcinoma ([place])   . Nausea without vomiting   . Acute pancreatitis 06/14/2020  . Obesity (BMI 30.0-34.9) 08/31/2018  . Adjustment disorder with mixed anxiety and depressed mood   . Chest pain 05/11/2017  . Anxiety about health 05/11/2017  . Community acquired pneumonia 05/07/2017  . Lung nodule   . Abdominal aortic aneurysm (AAA) without rupture ([place])   . Hyperlipidemia   . 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 Vocal cord paralysis 08/12/2016  . Goiter 07/31/2016  .  Cough 03/06/2015  . Controlled type 2 diabetes mellitus without complication, without long-term current use of insulin ([place]) 08/09/2012  . Essential hypertension 08/07/2012  . Mixed hyperlipidemia 08/07/2012  . CAD (coronary artery disease) 04/28/2011  . Unspecified adverse effect of unspecified drug, medicinal and biological substance 04/28/2011  . Lipoma 03/01/2011  . Tremor 03/01/2011  . Hx of adenomatous polyp of colon 07/02/2009  .  UNSPECIFIED PROSTATITIS 05/05/2009    Past Surgical History:  Procedure Laterality Date  . BIOPSY  06/18/2020   Procedure: BIOPSY;  Surgeon: Rush Landmark Telford Nab., MD;  Location: [facility];  Service: Gastroenterology;;  . [place]  . COLONOSCOPY W/ POLYPECTOMY  07/02/2009; 01/24/20   2011 adenoma.  2021 adenomas x 3->recall 2026  . CYSTOSCOPY WITH RETROGRADE PYELOGRAM, URETEROSCOPY AND STENT PLACEMENT Right 05/09/2018   Procedure: CYSTOSCOPY WITH RIGHT RETROGRADE RIGHT URETEROSCOPY STONE EXTRACTION RIGHT STENT EXCHANGE;  Surgeon: Lucas Mallow, MD;  Location: [facility];  Service: Urology;  Laterality: Right;  . CYSTOSCOPY/RETROGRADE/URETEROSCOPY/STONE EXTRACTION WITH BASKET Right 04/30/2018   Procedure: CYSTOSCOPY/RETROGRADE/RIGHT URETEROSCOPY/;  Surgeon: Lucas Mallow, MD;  Location: [facility];  Service: Urology;  Laterality: Right;  . ESOPHAGOGASTRODUODENOSCOPY  07/02/2009   NORMAL  . ESOPHAGOGASTRODUODENOSCOPY (EGD) WITH PROPOFOL N/A 06/18/2020   Procedure: ESOPHAGOGASTRODUODENOSCOPY (EGD) WITH PROPOFOL;  Surgeon: Rush Landmark Telford Nab., MD;  Location: [facility];  Service: Gastroenterology;  Laterality: N/A;  . EYE SURGERY    . FINE NEEDLE ASPIRATION  06/18/2020   Procedure: FINE NEEDLE ASPIRATION (FNA) LINEAR;  Surgeon: Irving Copas., MD;  Location: [facility];  Service: Gastroenterology;;  . finger amputaion     with reattachement  . INCISION AND DRAINAGE ABSCESS ANAL  11/23/2019   gen surg (In ED).  . IR IMAGING GUIDED PORT INSERTION  06/22/2020  . LASIK    . THYROIDECTOMY N/A 08/03/2016   Procedure: Total THYROIDECTOMY;  Surgeon: [name], MD;  Location: [facility];  Service: ENT;  Laterality: N/A;  Total Thyroidectomy   . UPPER ESOPHAGEAL ENDOSCOPIC ULTRASOUND (EUS) N/A 06/18/2020   Procedure: UPPER ESOPHAGEAL ENDOSCOPIC ULTRASOUND (EUS);  Surgeon: Irving Copas., MD;  Location: [facility];  Service: Gastroenterology;  Laterality: N/A;   . UPPER GASTROINTESTINAL ENDOSCOPY     with EUS and bx of pancreatic mass. +appearance of candida in esoph.         Family History  Problem Relation Age of Onset  . Diabetes Mother   . Cancer Mother        lung  . COPD Mother   . Brain cancer Mother   . Arthritis Mother   . Hyperlipidemia Mother   . Heart disease Mother   . Hypertension Mother   . Diabetes Father   . Cancer Father        mesothelioma; skin  . Mental illness Father   . Arthritis Father   . Hyperlipidemia Father   . Heart disease Father   . Hypertension Father   . Heart disease Maternal Grandmother        MI @ 29  . Heart disease Maternal Grandfather        MI @ 24  . Diabetes Paternal Grandmother   . Dementia Paternal Grandmother   . Heart disease Paternal Grandfather        MI @ 26  . Colon cancer Neg Hx   . Esophageal cancer Neg Hx   . Rectal cancer Neg Hx   . Stomach cancer Neg Hx     Social History  Tobacco Use  . Smoking status: Never Smoker  . Smokeless tobacco: Current User    Types: Snuff  Vaping Use  . Vaping Use: Never used  Substance Use Topics  . Alcohol use: No  . Drug use: No    Home Medications Prior to Admission medications   Medication Sig Start Date End Date Taking? Authorizing Provider  cloNIDine (CATAPRES) 0.1 MG tablet 1 tab po twice daily x 4 days, then 1 tab po daily x 4 days, then stop. 07/02/20   McGowen, Adrian Blackwater, MD  cloNIDine (CATAPRES) 0.3 MG tablet Take 0.3 mg by mouth at bedtime. 07/03/20   [provider]  Continuous Blood Gluc Sensor (FREESTYLE LIBRE 2 SENSOR) MISC 2 Devices by Does not apply route every 14 (fourteen) days. 04/03/20   Elayne Snare, MD  dexamethasone (DECADRON) 4 MG tablet Take 2 tablets (8 mg total) by mouth daily. Start the day after chemotherapy for 3 days. Take with food. 06/29/20   [name], MD  insulin glargine (LANTUS SOLOSTAR) 100 UNIT/ML Solostar Pen Give up to 10 units under the skin every evening at bedtime. Patient  taking differently: Give up to 10 units under the skin every evening at bedtime. 06/22/20   Charlynne Cousins, MD  insulin lispro (HUMALOG KWIKPEN) 100 UNIT/ML KwikPen INJECT SUBCUTANEOUSLY UP TO 12 UNITS BEFORE MEALS 3  TIMES DAILY 06/28/20   Elayne Snare, MD  Insulin Pen Needle 31G X 5 MM MISC Use with insulin pen 3 times a day 04/24/20   Elayne Snare, MD  Lancets [facility] DELICA PLUS [id number]) [place] USE TO CHECK BLOOD SUGAR  ONCE DAILY 06/22/20   McGowen, Adrian Blackwater, MD  levothyroxine (SYNTHROID) 137 MCG tablet TAKE 1 TABLET BY MOUTH  DAILY BEFORE BREAKFAST Patient taking differently: Take 137 mcg by mouth daily before breakfast. 06/03/20   Elayne Snare, MD  lidocaine-prilocaine (EMLA) cream Apply to affected area once 06/29/20   [name], MD  lipase/protease/amylase (CREON) 36000 UNITS CPEP capsule Take 2 capsules (72,000 Units total) by mouth 3 (three) times daily with meals. May also take 1 capsule (36,000 Units total) as needed (with snacks). 06/30/20   [name], MD  loperamide (IMODIUM A-D) 2 MG tablet Take 2 at onset of diarrhea, then 1 every 2hrs until 12hr without a BM. May take 2 tab every 4hrs at bedtime. If diarrhea recurs repeat. 06/29/20   [name], MD  LORazepam (ATIVAN) 0.5 MG tablet Take 1 tablet (0.5 mg total) by mouth every 6 (six) hours as needed for anxiety. 06/29/20   [name], MD  morphine (MS CONTIN) 15 MG 12 hr tablet Take 1 tablet (15 mg total) by mouth every 12 (twelve) hours. For cancer pain 07/06/20   [name], MD  nicotine (NICODERM CQ - DOSED IN MG/24 HOURS) 21 mg/24hr patch Place 1 patch (21 mg total) onto the skin daily. 06/22/20   Charlynne Cousins, MD  omeprazole (PRILOSEC) 20 MG capsule Take 1 capsule (20 mg total) by mouth daily. 06/30/20   [name], MD  ondansetron (ZOFRAN) 8 MG tablet Take 1 tablet (8 mg total) by mouth 2 (two) times daily as needed. Start on day 3 after chemotherapy. 06/29/20   [name], MD  ONETOUCH ULTRA  test strip USE TO CHECK BLOOD SUGAR  ONCE DAILY 06/22/20   McGowen, Adrian Blackwater, MD  oxyCODONE (OXY IR/ROXICODONE) 5 MG immediate release tablet Take 1-2 tablets (5-10 mg total) by mouth every 3 (three) hours as needed for severe pain. For  cancer pain 06/29/20   [name], MD  polyethylene glycol powder (GLYCOLAX/MIRALAX) 17 GM/SCOOP powder Dissolve 1 capful (17 g total) in water and drink three times daily. 06/22/20   Charlynne Cousins, MD  prochlorperazine (COMPAZINE) 10 MG tablet Take 1 tablet (10 mg total) by mouth every 6 (six) hours as needed (Nausea or vomiting). 06/29/20   [name], MD  psyllium (METAMUCIL SMOOTH TEXTURE) 28 % packet Take 1 packet by mouth daily.    [provider]    Allergies    Atenolol, Hydroxyzine, Tetracycline, Atorvastatin, Codeine, Guaifenesin, and Mucinex [guaifenesin er]  Review of Systems   Review of Systems  Constitutional: Negative for chills and fever.  Respiratory: Negative for shortness of breath.   Cardiovascular: Negative for chest pain.  Gastrointestinal: Positive for abdominal pain, diarrhea (loose stool) and nausea. Negative for constipation and vomiting.  Genitourinary: Negative for dysuria.  Neurological: Negative for syncope.  All other systems reviewed and are negative.   Physical Exam Updated Vital Signs BP (!) 182/90   Pulse 65   Temp 98.5 F (36.9 C) (Oral)   Resp 16   Ht 5\' 9"  (1.753 m)   Wt 81.6 kg   SpO2 100%   BMI 26.58 kg/m   Physical Exam Vitals and nursing note reviewed.  Constitutional:      General: He is not in acute distress.    Appearance: He is well-developed. He is not toxic-appearing.  HENT:     Head: Normocephalic and atraumatic.  Eyes:     General:        Right eye: No discharge.        Left eye: No discharge.     Conjunctiva/sclera: Conjunctivae normal.  Cardiovascular:     Rate and Rhythm: Normal rate and regular rhythm.  Pulmonary:     Effort: Pulmonary effort is normal. No  respiratory distress.     Breath sounds: Normal breath sounds. No wheezing, rhonchi or rales.  Abdominal:     General: There is no distension.     Palpations: Abdomen is soft.     Tenderness: There is abdominal tenderness (generalized, most prominent in the LUQ).  Musculoskeletal:     Cervical back: Neck supple.  Skin:    General: Skin is warm and dry.     Findings: No rash.  Neurological:     Mental Status: He is alert.     Comments: Clear speech.   Psychiatric:        Behavior: Behavior normal.     ED Results / Procedures / Treatments   Labs (all labs ordered are listed, but only abnormal results are displayed) Labs Reviewed  COMPREHENSIVE METABOLIC PANEL - Abnormal; Notable for the following components:      Result Value   Sodium 133 (*)    Glucose, Bld 268 (*)    All other components within normal limits  CBC WITH DIFFERENTIAL/PLATELET - Abnormal; Notable for the following components:   Hemoglobin 12.9 (*)    HCT 37.3 (*)    All other components within normal limits  LIPASE, BLOOD  URINALYSIS, ROUTINE W REFLEX MICROSCOPIC    EKG None  Radiology CT ABDOMEN PELVIS WO CONTRAST  Result Date: 07/06/2020 CLINICAL DATA:  Nonlocalized acute abdominal pain. Mid abdominal pain since yesterday. History of pancreatic cancer in May ongoing chemo EXAM: CT ABDOMEN AND PELVIS WITHOUT CONTRAST TECHNIQUE: Multidetector CT imaging of the abdomen and pelvis was performed following the standard protocol without IV contrast. COMPARISON:  CT angio chest  09/11/2019, CT chest 06/18/2020, CT abdomen 06/14/2020, MRI abdomen 06/15/2020 FINDINGS: Lower chest: Redemonstration of a 6 mm in 5 mm left lower lobe pulmonary nodule (2:1). Redemonstration of a 4 mm right middle lobe pulmonary nodule (2:6). Coronary artery calcifications. Hepatobiliary: No focal liver abnormality. No CT findings of calcified gallstone. No gallbladder wall thickening or pericholecystic fluid. No biliary dilatation. Pancreas:  Redemonstration of 1.4 x 1 cm pancreatic head hypodensity with associated distal main pancreatic duct dilatation measuring up to at least 0.9 cm. Otherwise normal pancreatic contour. No surrounding inflammatory changes. No main pancreatic ductal dilatation. Spleen: Normal in size without focal abnormality. Adrenals/Urinary Tract: No adrenal nodule bilaterally. No nephrolithiasis, no hydronephrosis, and no contour-deforming renal mass. No ureterolithiasis or hydroureter. The urinary bladder is unremarkable. Stomach/Bowel: Stomach is within normal limits. No evidence of bowel wall thickening or dilatation. Appendix appears normal. Vascular/Lymphatic: No abdominal aorta or iliac aneurysm. Mild atherosclerotic plaque of the aorta and its branches. No abdominal, pelvic, or inguinal lymphadenopathy. Reproductive: Prostate is unremarkable. Other: No intraperitoneal free fluid. No intraperitoneal free gas. No organized fluid collection. Musculoskeletal: No abdominal wall hernia or abnormality. No suspicious lytic or blastic osseous lesions. No acute displaced fracture. Multilevel degenerative changes of the spine. IMPRESSION: 1. Please note limited evaluation in a patient with known pancreatic cancer. Pulmonary nodules and pancreatic mass with associated main pancreatic duct dilatation appear grossly stable. 2. Otherwise no acute intra-abdominal or intrapelvic abnormality. Electronically Signed   By: Iven Finn M.D.   On: 07/06/2020 22:38    Procedures Procedures   Medications Ordered in ED Medications - No data to display  ED Course  I have reviewed the triage vital signs and the nursing notes.  Pertinent labs & imaging results that were available during my care of the patient were reviewed by me and considered in my medical decision making (see chart for details).    MDM Rules/Calculators/A&P                         Patient presents to the ED with complaints of abdominal pain.  Patient with elevated  blood pressure, vitals otherwise unremarkable.  On exam patient has generalized abdominal tenderness with increased focality in the left upper quadrant.  Additional history obtained:  Additional history obtained from chart review & nursing note review.   Lab Tests:  I reviewed and interpreted labs, which included:  CBC: Mild anemia similar to prior ranges CMP: Hyperglycemia without acidosis or anion gap elevation, LFTs are within normal limits Lipase: Within normal limits Urinalysis: No UTI  Imaging Studies ordered:  CT abdomen/pelvis without contrast ordered by provider in triage, current national IV Contrast shortage, I independently reviewed, formal radiology impression shows:  1. Please note limited evaluation in a patient with known pancreatic cancer. Pulmonary nodules and pancreatic mass with associated main pancreatic duct dilatation appear grossly stable. 2. Otherwise no acute intra-abdominal or intrapelvic abnormality.  ED Course:   Reassuring labs overall. CT with findings that appears grossly stable.  Given analgesics with significant improvement, repeat abdominal exam without peritoneal signs- low suspicion for acute surgical abdomen at this time.  Patient able to tolerate p.o., he feels much better, and is comfortable with plan for discharge at this time.  We discussed following up closely with his oncologist and primary care provider. I discussed results, treatment plan, need for follow-up, and return precautions with the patient. Provided opportunity for questions, patient confirmed understanding and is in agreement with plan.  Findings and plan of care discussed with supervising physician Dr. Billy Fischer who is in agreement.   Portions of this note were generated with [employer]. Dictation errors may occur despite best attempts at proofreading.  Final Clinical Impression(s) / ED Diagnoses Final diagnoses:  Left upper quadrant abdominal pain    Rx / DC  Orders ED Discharge Orders    None       Amaryllis Dyke, PA-C 07/07/20 0057    Gareth Morgan, MD 07/07/20 1332

## 2020-07-07 ENCOUNTER — Telehealth: Payer: Self-pay | Admitting: Genetic Counselor

## 2020-07-07 ENCOUNTER — Other Ambulatory Visit: Payer: Self-pay | Admitting: Hematology and Oncology

## 2020-07-07 ENCOUNTER — Telehealth: Payer: Self-pay

## 2020-07-07 LAB — URINALYSIS, ROUTINE W REFLEX MICROSCOPIC
Bacteria, UA: NONE SEEN
Bilirubin Urine: NEGATIVE
Glucose, UA: >=500 mg/dL — AB
Hgb urine dipstick: NEGATIVE
Ketones, ur: 20 mg/dL — AB
Leukocytes,Ua: NEGATIVE
Nitrite: NEGATIVE
Protein, ur: NEGATIVE mg/dL
Specific Gravity, Urine: 1.018 (ref 1.005–1.030)
pH: 5 (ref 5.0–8.0)

## 2020-07-07 MED ORDER — HYDROMORPHONE HCL 1 MG/ML IJ SOLN
0.5000 mg | Freq: Once | INTRAMUSCULAR | Status: AC
  Administered 2020-07-07: 0.5 mg via INTRAVENOUS
  Filled 2020-07-07: qty 1

## 2020-07-07 NOTE — ED Notes (Signed)
Pt given water for PO challenge. Pt states he will attempt to give a urine sample.

## 2020-07-07 NOTE — Telephone Encounter (Signed)
Spoke with patient's wife regarding after hours call and visit to ED. She states patient is doing better today; he is able to drink with the help of magic mouthwash and his abdominal pain has improved. I mentioned the possibility of scheduling IVF or a g-tube placement if problems persist. Family was understandably hesitant. Patient's wife stated that as of right now, they are able to manage symptoms at home and that they will discuss more with Dr Chryl Heck during next office visit. I encouraged them to call with any future concerns.

## 2020-07-07 NOTE — Telephone Encounter (Signed)
LM on VM that results are back and to please call.  Left CB instructions. 

## 2020-07-07 NOTE — Discharge Instructions (Addendum)
You were seen in the emergency department today for abdominal pain.  Your labs show that your blood sugar is elevated, please be sure to monitor this at home.  Your CT scan did not show significant changes compared to prior.  Please call your primary care and oncologist first thing in the morning to make them aware of your ER visit and for close follow-up.  Return to the ER for new or worsening symptoms including but not limited to new or worsening pain, inability keep fluids down, fever, dark/tarry stools, passing out, or any other concerns.

## 2020-07-13 ENCOUNTER — Emergency Department (HOSPITAL_COMMUNITY): Payer: 59

## 2020-07-13 ENCOUNTER — Inpatient Hospital Stay (HOSPITAL_COMMUNITY)
Admission: EM | Admit: 2020-07-13 | Discharge: 2020-07-17 | DRG: 392 | Disposition: A | Discharge status: Home / Self Care | Payer: 59 | Attending: Internal Medicine | Admitting: Internal Medicine

## 2020-07-13 ENCOUNTER — Encounter (HOSPITAL_COMMUNITY): Payer: Self-pay | Admitting: Emergency Medicine

## 2020-07-13 ENCOUNTER — Other Ambulatory Visit: Payer: Self-pay

## 2020-07-13 DIAGNOSIS — Z79890 Hormone replacement therapy (postmenopausal): Secondary | ICD-10-CM

## 2020-07-13 DIAGNOSIS — E119 Type 2 diabetes mellitus without complications: Secondary | ICD-10-CM | POA: Diagnosis present

## 2020-07-13 DIAGNOSIS — I2510 Atherosclerotic heart disease of native coronary artery without angina pectoris: Secondary | ICD-10-CM | POA: Diagnosis present

## 2020-07-13 DIAGNOSIS — I252 Old myocardial infarction: Secondary | ICD-10-CM

## 2020-07-13 DIAGNOSIS — E785 Hyperlipidemia, unspecified: Secondary | ICD-10-CM | POA: Diagnosis present

## 2020-07-13 DIAGNOSIS — Z833 Family history of diabetes mellitus: Secondary | ICD-10-CM

## 2020-07-13 DIAGNOSIS — Z825 Family history of asthma and other chronic lower respiratory diseases: Secondary | ICD-10-CM

## 2020-07-13 DIAGNOSIS — C259 Malignant neoplasm of pancreas, unspecified: Secondary | ICD-10-CM | POA: Diagnosis not present

## 2020-07-13 DIAGNOSIS — E860 Dehydration: Secondary | ICD-10-CM | POA: Diagnosis present

## 2020-07-13 DIAGNOSIS — C787 Secondary malignant neoplasm of liver and intrahepatic bile duct: Secondary | ICD-10-CM | POA: Diagnosis present

## 2020-07-13 DIAGNOSIS — I712 Thoracic aortic aneurysm, without rupture: Secondary | ICD-10-CM | POA: Diagnosis present

## 2020-07-13 DIAGNOSIS — K219 Gastro-esophageal reflux disease without esophagitis: Secondary | ICD-10-CM | POA: Diagnosis present

## 2020-07-13 DIAGNOSIS — F419 Anxiety disorder, unspecified: Secondary | ICD-10-CM | POA: Diagnosis present

## 2020-07-13 DIAGNOSIS — T451X5A Adverse effect of antineoplastic and immunosuppressive drugs, initial encounter: Secondary | ICD-10-CM | POA: Diagnosis present

## 2020-07-13 DIAGNOSIS — E872 Acidosis: Secondary | ICD-10-CM | POA: Diagnosis present

## 2020-07-13 DIAGNOSIS — Z20822 Contact with and (suspected) exposure to covid-19: Secondary | ICD-10-CM | POA: Diagnosis present

## 2020-07-13 DIAGNOSIS — Z8673 Personal history of transient ischemic attack (TIA), and cerebral infarction without residual deficits: Secondary | ICD-10-CM | POA: Diagnosis not present

## 2020-07-13 DIAGNOSIS — K529 Noninfective gastroenteritis and colitis, unspecified: Secondary | ICD-10-CM | POA: Diagnosis not present

## 2020-07-13 DIAGNOSIS — Z794 Long term (current) use of insulin: Secondary | ICD-10-CM | POA: Diagnosis not present

## 2020-07-13 DIAGNOSIS — Z79899 Other long term (current) drug therapy: Secondary | ICD-10-CM

## 2020-07-13 DIAGNOSIS — E861 Hypovolemia: Secondary | ICD-10-CM | POA: Diagnosis present

## 2020-07-13 DIAGNOSIS — Z66 Do not resuscitate: Secondary | ICD-10-CM | POA: Diagnosis present

## 2020-07-13 DIAGNOSIS — Z808 Family history of malignant neoplasm of other organs or systems: Secondary | ICD-10-CM

## 2020-07-13 DIAGNOSIS — E871 Hypo-osmolality and hyponatremia: Secondary | ICD-10-CM | POA: Diagnosis present

## 2020-07-13 DIAGNOSIS — Z888 Allergy status to other drugs, medicaments and biological substances status: Secondary | ICD-10-CM

## 2020-07-13 DIAGNOSIS — Z720 Tobacco use: Secondary | ICD-10-CM

## 2020-07-13 DIAGNOSIS — E890 Postprocedural hypothyroidism: Secondary | ICD-10-CM | POA: Diagnosis present

## 2020-07-13 DIAGNOSIS — Z885 Allergy status to narcotic agent status: Secondary | ICD-10-CM

## 2020-07-13 DIAGNOSIS — Z8249 Family history of ischemic heart disease and other diseases of the circulatory system: Secondary | ICD-10-CM | POA: Diagnosis not present

## 2020-07-13 DIAGNOSIS — Z79891 Long term (current) use of opiate analgesic: Secondary | ICD-10-CM

## 2020-07-13 DIAGNOSIS — Z83438 Family history of other disorder of lipoprotein metabolism and other lipidemia: Secondary | ICD-10-CM

## 2020-07-13 DIAGNOSIS — I10 Essential (primary) hypertension: Secondary | ICD-10-CM | POA: Diagnosis not present

## 2020-07-13 LAB — COMPREHENSIVE METABOLIC PANEL
ALT: 13 U/L (ref 0–44)
AST: 14 U/L — ABNORMAL LOW (ref 15–41)
Albumin: 4.1 g/dL (ref 3.5–5.0)
Alkaline Phosphatase: 82 U/L (ref 38–126)
Anion gap: 15 (ref 5–15)
BUN: 16 mg/dL (ref 8–23)
CO2: 21 mmol/L — ABNORMAL LOW (ref 22–32)
Calcium: 9.4 mg/dL (ref 8.9–10.3)
Chloride: 95 mmol/L — ABNORMAL LOW (ref 98–111)
Creatinine, Ser: 1.22 mg/dL (ref 0.61–1.24)
GFR, Estimated: >60 mL/min (ref >60)
Glucose, Bld: 208 mg/dL — ABNORMAL HIGH (ref 70–99)
Potassium: 3.9 mmol/L (ref 3.5–5.1)
Sodium: 131 mmol/L — ABNORMAL LOW (ref 135–145)
Total Bilirubin: 0.9 mg/dL (ref 0.3–1.2)
Total Protein: 7.6 g/dL (ref 6.5–8.1)

## 2020-07-13 LAB — LACTIC ACID, PLASMA
Lactic Acid, Venous: 2.4 mmol/L (ref 0.5–1.9)
Lactic Acid, Venous: 1.6 mmol/L (ref 0.5–1.9)

## 2020-07-13 LAB — CBG MONITORING, ED: Glucose-Capillary: 170 mg/dL — ABNORMAL HIGH (ref 70–99)

## 2020-07-13 LAB — CBC WITH DIFFERENTIAL/PLATELET
Abs Immature Granulocytes: 0.02 10*3/uL (ref 0.00–0.07)
Basophils Absolute: 0 10*3/uL (ref 0.0–0.1)
Basophils Relative: 0 %
Eosinophils Absolute: 0 10*3/uL (ref 0.0–0.5)
Eosinophils Relative: 0 %
HCT: 46.7 % (ref 39.0–52.0)
Hemoglobin: 16.1 g/dL (ref 13.0–17.0)
Immature Granulocytes: 0 %
Lymphocytes Relative: 7 %
Lymphs Abs: 0.7 10*3/uL (ref 0.7–4.0)
MCH: 30.4 pg (ref 26.0–34.0)
MCHC: 34.5 g/dL (ref 30.0–36.0)
MCV: 88.1 fL (ref 80.0–100.0)
Monocytes Absolute: 0.5 10*3/uL (ref 0.1–1.0)
Monocytes Relative: 6 %
Neutro Abs: 8.6 10*3/uL — ABNORMAL HIGH (ref 1.7–7.7)
Neutrophils Relative %: 87 %
Platelets: 226 10*3/uL (ref 150–400)
RBC: 5.3 MIL/uL (ref 4.22–5.81)
RDW: 12.3 % (ref 11.5–15.5)
WBC: 9.9 10*3/uL (ref 4.0–10.5)
nRBC: 0 % (ref 0.0–0.2)

## 2020-07-13 LAB — URINALYSIS, ROUTINE W REFLEX MICROSCOPIC
Bilirubin Urine: NEGATIVE
Glucose, UA: 50 mg/dL — AB
Hgb urine dipstick: NEGATIVE
Ketones, ur: 20 mg/dL — AB
Leukocytes,Ua: NEGATIVE
Nitrite: NEGATIVE
Protein, ur: 30 mg/dL — AB
Specific Gravity, Urine: 1.033 — ABNORMAL HIGH (ref 1.005–1.030)
pH: 5 (ref 5.0–8.0)

## 2020-07-13 LAB — LIPASE, BLOOD: Lipase: 21 U/L (ref 11–51)

## 2020-07-13 MED ORDER — PANCRELIPASE (LIP-PROT-AMYL) 12000-38000 UNITS PO CPEP
72000.0000 [IU] | ORAL_CAPSULE | Freq: Three times a day (TID) | ORAL | Status: DC
  Administered 2020-07-14 – 2020-07-15 (×4): 72000 [IU] via ORAL
  Filled 2020-07-13: qty 6
  Filled 2020-07-13: qty 2
  Filled 2020-07-13: qty 6
  Filled 2020-07-13: qty 2
  Filled 2020-07-13: qty 6

## 2020-07-13 MED ORDER — LEVOTHYROXINE SODIUM 25 MCG PO TABS
137.0000 ug | ORAL_TABLET | Freq: Every day | ORAL | Status: DC
  Administered 2020-07-14 – 2020-07-17 (×4): 137 ug via ORAL
  Filled 2020-07-13 (×4): qty 1

## 2020-07-13 MED ORDER — HYDROMORPHONE HCL 1 MG/ML IJ SOLN
1.0000 mg | Freq: Once | INTRAMUSCULAR | Status: AC
  Administered 2020-07-13: 1 mg via INTRAVENOUS
  Filled 2020-07-13: qty 1

## 2020-07-13 MED ORDER — ONDANSETRON HCL 4 MG/2ML IJ SOLN
4.0000 mg | Freq: Four times a day (QID) | INTRAMUSCULAR | Status: DC | PRN
  Administered 2020-07-14 – 2020-07-15 (×2): 4 mg via INTRAVENOUS
  Filled 2020-07-13 (×2): qty 2

## 2020-07-13 MED ORDER — ACETAMINOPHEN 650 MG RE SUPP: 650.0000 mg | Freq: Four times a day (QID) | RECTAL | Status: DC | PRN

## 2020-07-13 MED ORDER — ENOXAPARIN SODIUM 40 MG/0.4ML IJ SOSY
40.0000 mg | PREFILLED_SYRINGE | INTRAMUSCULAR | Status: DC
  Administered 2020-07-14 – 2020-07-16 (×3): 40 mg via SUBCUTANEOUS
  Filled 2020-07-13 (×3): qty 0.4

## 2020-07-13 MED ORDER — PANTOPRAZOLE SODIUM 40 MG PO TBEC
40.0000 mg | DELAYED_RELEASE_TABLET | Freq: Every day | ORAL | Status: DC
  Administered 2020-07-14 – 2020-07-17 (×4): 40 mg via ORAL
  Filled 2020-07-13 (×4): qty 1

## 2020-07-13 MED ORDER — INSULIN GLARGINE 100 UNIT/ML ~~LOC~~ SOLN
20.0000 [IU] | Freq: Every day | SUBCUTANEOUS | Status: DC
  Administered 2020-07-13 – 2020-07-14 (×2): 20 [IU] via SUBCUTANEOUS
  Filled 2020-07-13 (×3): qty 0.2

## 2020-07-13 MED ORDER — LACTATED RINGERS IV BOLUS
1000.0000 mL | Freq: Once | INTRAVENOUS | Status: AC
  Administered 2020-07-13: 1000 mL via INTRAVENOUS

## 2020-07-13 MED ORDER — HYDROMORPHONE HCL 1 MG/ML IJ SOLN
1.0000 mg | INTRAMUSCULAR | Status: DC | PRN
  Administered 2020-07-13 – 2020-07-15 (×8): 1 mg via INTRAVENOUS
  Filled 2020-07-13 (×8): qty 1

## 2020-07-13 MED ORDER — METRONIDAZOLE 500 MG/100ML IV SOLN
500.0000 mg | Freq: Three times a day (TID) | INTRAVENOUS | Status: DC
  Administered 2020-07-13 – 2020-07-16 (×8): 500 mg via INTRAVENOUS
  Filled 2020-07-13 (×8): qty 100

## 2020-07-13 MED ORDER — INSULIN ASPART 100 UNIT/ML IJ SOLN: 0.0000 [IU] | Freq: Every day | INTRAMUSCULAR | Status: DC

## 2020-07-13 MED ORDER — ACETAMINOPHEN 325 MG PO TABS
650.0000 mg | ORAL_TABLET | Freq: Four times a day (QID) | ORAL | Status: DC | PRN
  Administered 2020-07-15: 650 mg via ORAL
  Filled 2020-07-13: qty 2

## 2020-07-13 MED ORDER — SODIUM CHLORIDE 0.9 % IV SOLN: INTRAVENOUS | Status: AC

## 2020-07-13 MED ORDER — SODIUM CHLORIDE 0.9 % IV SOLN
2.0000 g | Freq: Once | INTRAVENOUS | Status: AC
  Administered 2020-07-13: 2 g via INTRAVENOUS
  Filled 2020-07-13: qty 20

## 2020-07-13 MED ORDER — ONDANSETRON HCL 4 MG/2ML IJ SOLN
4.0000 mg | Freq: Once | INTRAMUSCULAR | Status: AC
  Administered 2020-07-13: 4 mg via INTRAVENOUS
  Filled 2020-07-13: qty 2

## 2020-07-13 MED ORDER — INSULIN ASPART 100 UNIT/ML IJ SOLN
0.0000 [IU] | Freq: Three times a day (TID) | INTRAMUSCULAR | Status: DC
  Administered 2020-07-15: 1 [IU] via SUBCUTANEOUS
  Administered 2020-07-16 (×2): 2 [IU] via SUBCUTANEOUS
  Administered 2020-07-17: 1 [IU] via SUBCUTANEOUS

## 2020-07-13 MED ORDER — SODIUM CHLORIDE 0.9 % IV SOLN
2.0000 g | INTRAVENOUS | Status: DC
  Administered 2020-07-14 – 2020-07-16 (×3): 2 g via INTRAVENOUS
  Filled 2020-07-13 (×3): qty 20

## 2020-07-13 MED ORDER — LORAZEPAM 0.5 MG PO TABS
0.5000 mg | ORAL_TABLET | Freq: Four times a day (QID) | ORAL | Status: DC | PRN
  Administered 2020-07-13 – 2020-07-15 (×2): 0.5 mg via ORAL
  Filled 2020-07-13 (×2): qty 1

## 2020-07-13 NOTE — ED Notes (Signed)
Pt ambulated to the bathroom independently.

## 2020-07-13 NOTE — ED Triage Notes (Signed)
Pt BIB [facility] from home for constant abd pain x3 days. Pt has rec dx of pancreatic cancer (06/14/20), started chemo last week, reports some diarrhea and nausea but no vomiting. Pt reports some chills at night, but is afebrile now/ VSS, aox4

## 2020-07-13 NOTE — ED Provider Notes (Signed)
[facility] EMERGENCY DEPARTMENT Provider Note   CSN: [id number] Arrival date & time: 07/13/20  1759     History Chief Complaint  Patient presents with  . Abdominal Pain    Timothy Page is a 65 y.o. male.  The history is provided by the patient, the spouse and medical records.  Abdominal Pain Pain location:  Generalized (lower abdomen) Pain quality: bloating, dull, gnawing and stabbing   Pain radiates to:  Does not radiate Pain severity:  Severe Onset quality:  Gradual Duration:  3 days Timing:  Constant Progression:  Worsening Chronicity:  New Context: eating   Context: not alcohol use, not awakening from sleep, not diet changes, not laxative use, not medication withdrawal, not previous surgeries, not recent illness, not recent sexual activity, not recent travel, not retching, not sick contacts, not suspicious food intake and not trauma   Context comment:  H/o pancreatic cancer, currently on chemo (first round was 2 weeks ago) Relieved by:  Nothing Worsened by:  Movement, palpation and position changes Ineffective treatments:  Bowel activity, liquids, OTC medications and antacids (prescription medications including opoids) Associated symptoms: anorexia, chills, diarrhea, fatigue and nausea   Associated symptoms: no belching, no chest pain, no constipation, no cough, no dysuria, no fever, no flatus, no hematemesis, no hematochezia, no hematuria, no melena, no shortness of breath, no sore throat and no vomiting        Past Medical History:  Diagnosis Date  . Arthritis   . Atypical chest pain 04/2017   ACS ruled out 05/11/17  . CAP (community acquired pneumonia) 05/07/2017  . Diabetes mellitus without complication ([place])    Poor control starting fall 2021-->GAD ab neg.  Insulin and C peptide levels wnl but on lower end of normal->Dr Kuma/endo 2022.  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 Hx of adenomatous polyp of colon  07/02/2009   rpt colonoscopy 2026  . Hyperlipidemia    Intol of multiple statins and zetia. [facility] 2022.  [name] Hypertension   . Lipoma    back  . Myocardial infarction ([place])    MI at age 34  . Pancreatic adenocarcinoma ([place]) 06/2020   Mets to liver (?and lungs?), w/acute pancreatitis.  [name] Perianal abscess 11/2019   I&D'd by Dr.Thompson, gen surg, in the ED 11/23/19  . 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 Cammy Copa  . Right ureteral calculus 04/2018   Cystoscopy with right retrograde pyelogram and right ureteral stent placement after stone removal.  . Seasonal allergies   . Stroke ([place])    in 25Z no complications   . Tachyarrhythmia 1980   age 66 X 2 over 66 month period.  No recurrence since that time.  . Thoracic aortic aneurysm ([place])    Incidentally discovered, 4.1 cm-->Dr. Cyndia Bent. Rpt CT 08/2018 and 08/2019 stable.  . Vocal cord paralysis 07/2016   Left vocal cord paralysis noted after thyroid surgery.    Patient Active Problem List   Diagnosis Date Noted  . Colitis 07/13/2020  . Diabetes ([place]) 07/13/2020  . Hyponatremia 07/13/2020  . Genetic testing 07/02/2020  . Heart burn 06/30/2020  . Cancer related pain 06/30/2020  . Adverse drug interaction with prescription medication 06/30/2020  . Weight loss, unintentional 06/30/2020  . Adenocarcinoma of pancreas, stage 4 ([place]) 06/20/2020  . Dilation of pancreatic duct   . Pancreatic adenocarcinoma ([place])   . Nausea without vomiting   . Acute pancreatitis 06/14/2020  .  Obesity (BMI 30.0-34.9) 08/31/2018  . Adjustment disorder with mixed anxiety and depressed mood   . Chest pain 05/11/2017  . Anxiety about health 05/11/2017  . Community acquired pneumonia 05/07/2017  . Lung nodule   . Abdominal aortic aneurysm (AAA) without rupture ([place])   . Hyperlipidemia   . 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 Vocal cord paralysis 08/12/2016  . Goiter  07/31/2016  . Cough 03/06/2015  . Controlled type 2 diabetes mellitus without complication, without long-term current use of insulin ([place]) 08/09/2012  . Essential hypertension 08/07/2012  . Mixed hyperlipidemia 08/07/2012  . CAD (coronary artery disease) 04/28/2011  . Unspecified adverse effect of unspecified drug, medicinal and biological substance 04/28/2011  . Lipoma 03/01/2011  . Tremor 03/01/2011  . Hx of adenomatous polyp of colon 07/02/2009  . UNSPECIFIED PROSTATITIS 05/05/2009    Past Surgical History:  Procedure Laterality Date  . BIOPSY  06/18/2020   Procedure: BIOPSY;  Surgeon: Rush Landmark Telford Nab., MD;  Location: [facility];  Service: Gastroenterology;;  . [place]  . COLONOSCOPY W/ POLYPECTOMY  07/02/2009; 01/24/20   2011 adenoma.  2021 adenomas x 3->recall 2026  . CYSTOSCOPY WITH RETROGRADE PYELOGRAM, URETEROSCOPY AND STENT PLACEMENT Right 05/09/2018   Procedure: CYSTOSCOPY WITH RIGHT RETROGRADE RIGHT URETEROSCOPY STONE EXTRACTION RIGHT STENT EXCHANGE;  Surgeon: Lucas Mallow, MD;  Location: [facility];  Service: Urology;  Laterality: Right;  . CYSTOSCOPY/RETROGRADE/URETEROSCOPY/STONE EXTRACTION WITH BASKET Right 04/30/2018   Procedure: CYSTOSCOPY/RETROGRADE/RIGHT URETEROSCOPY/;  Surgeon: Lucas Mallow, MD;  Location: [facility];  Service: Urology;  Laterality: Right;  . ESOPHAGOGASTRODUODENOSCOPY  07/02/2009   NORMAL  . ESOPHAGOGASTRODUODENOSCOPY (EGD) WITH PROPOFOL N/A 06/18/2020   Procedure: ESOPHAGOGASTRODUODENOSCOPY (EGD) WITH PROPOFOL;  Surgeon: Rush Landmark Telford Nab., MD;  Location: [facility];  Service: Gastroenterology;  Laterality: N/A;  . EYE SURGERY    . FINE NEEDLE ASPIRATION  06/18/2020   Procedure: FINE NEEDLE ASPIRATION (FNA) LINEAR;  Surgeon: Irving Copas., MD;  Location: [facility];  Service: Gastroenterology;;  . finger amputaion     with reattachement  . INCISION AND DRAINAGE ABSCESS ANAL  11/23/2019   gen surg (In  ED).  . IR IMAGING GUIDED PORT INSERTION  06/22/2020  . LASIK    . THYROIDECTOMY N/A 08/03/2016   Procedure: Total THYROIDECTOMY;  Surgeon: [name], MD;  Location: [facility];  Service: ENT;  Laterality: N/A;  Total Thyroidectomy   . UPPER ESOPHAGEAL ENDOSCOPIC ULTRASOUND (EUS) N/A 06/18/2020   Procedure: UPPER ESOPHAGEAL ENDOSCOPIC ULTRASOUND (EUS);  Surgeon: Irving Copas., MD;  Location: [facility];  Service: Gastroenterology;  Laterality: N/A;  . UPPER GASTROINTESTINAL ENDOSCOPY     with EUS and bx of pancreatic mass. +appearance of candida in esoph.         Family History  Problem Relation Age of Onset  . Diabetes Mother   . Cancer Mother        lung  . COPD Mother   . Brain cancer Mother   . Arthritis Mother   . Hyperlipidemia Mother   . Heart disease Mother   . Hypertension Mother   . Diabetes Father   . Cancer Father        mesothelioma; skin  . Mental illness Father   . Arthritis Father   . Hyperlipidemia Father   . Heart disease Father   . Hypertension Father   . Heart disease Maternal Grandmother        MI @ 17  . Heart disease Maternal Grandfather  MI @ 75  . Diabetes Paternal Grandmother   . Dementia Paternal Grandmother   . Heart disease Paternal Grandfather        MI @ 79  . Colon cancer Neg Hx   . Esophageal cancer Neg Hx   . Rectal cancer Neg Hx   . Stomach cancer Neg Hx     Social History   Tobacco Use  . Smoking status: Never Smoker  . Smokeless tobacco: Current User    Types: Snuff  Vaping Use  . Vaping Use: Never used  Substance Use Topics  . Alcohol use: No  . Drug use: No    Home Medications Prior to Admission medications   Medication Sig Start Date End Date Taking? Authorizing Provider  Continuous Blood Gluc Sensor (FREESTYLE LIBRE 2 SENSOR) MISC 2 Devices by Does not apply route every 14 (fourteen) days. 04/03/20  Yes Elayne Snare, MD  insulin glargine (LANTUS SOLOSTAR) 100 UNIT/ML Solostar Pen Give up to 10 units under  the skin every evening at bedtime. Patient taking differently: Inject 20 Units into the skin at bedtime. 06/22/20  Yes Charlynne Cousins, MD  Insulin Pen Needle 31G X 5 MM MISC Use with insulin pen 3 times a day 04/24/20  Yes Elayne Snare, MD  lactulose (CEPHULAC) 10 g packet Take 10 g by mouth daily.   Yes [provider]  Lancets (ONETOUCH DELICA PLUS [id number]) [place] USE TO CHECK BLOOD SUGAR  ONCE DAILY 06/22/20  Yes McGowen, Adrian Blackwater, MD  levothyroxine (SYNTHROID) 137 MCG tablet TAKE 1 TABLET BY MOUTH  DAILY BEFORE BREAKFAST Patient taking differently: Take 137 mcg by mouth daily before breakfast. 06/03/20  Yes Elayne Snare, MD  lipase/protease/amylase (CREON) 36000 UNITS CPEP capsule Take 2 capsules (72,000 Units total) by mouth 3 (three) times daily with meals. May also take 1 capsule (36,000 Units total) as needed (with snacks). 06/30/20  Yes Iruku, Arletha Pili, MD  LORazepam (ATIVAN) 0.5 MG tablet Take 1 tablet (0.5 mg total) by mouth every 6 (six) hours as needed for anxiety. 06/29/20  Yes [name], MD  morphine (MS CONTIN) 15 MG 12 hr tablet Take 1 tablet (15 mg total) by mouth every 12 (twelve) hours. For cancer pain 07/06/20  Yes Iruku, Arletha Pili, MD  omeprazole (PRILOSEC) 20 MG capsule Take 1 capsule (20 mg total) by mouth daily. 06/30/20  Yes Iruku, Arletha Pili, MD  ondansetron (ZOFRAN) 8 MG tablet Take 1 tablet (8 mg total) by mouth 2 (two) times daily as needed. Start on day 3 after chemotherapy. 06/29/20  Yes [name], MD  ONETOUCH ULTRA test strip USE TO CHECK BLOOD SUGAR  ONCE DAILY 06/22/20  Yes McGowen, Adrian Blackwater, MD  senna-docusate (SENOKOT-S) 8.6-50 MG tablet Take 1 tablet by mouth 2 (two) times daily.   Yes [provider]    Allergies    Atenolol, Hydroxyzine, Tetracycline, Atorvastatin, Codeine, Guaifenesin, and Mucinex [guaifenesin er]  Review of Systems   Review of Systems  Constitutional: Positive for appetite change, chills and fatigue. Negative for  fever.  HENT: Negative for ear pain and sore throat.   Eyes: Negative for pain and visual disturbance.  Respiratory: Negative for cough and shortness of breath.   Cardiovascular: Negative for chest pain and palpitations.  Gastrointestinal: Positive for abdominal pain, anorexia, diarrhea and nausea. Negative for constipation, flatus, hematemesis, hematochezia, melena and vomiting.  Genitourinary: Negative for dysuria and hematuria.  Musculoskeletal: Negative for arthralgias and back pain.  Skin: Negative for color change and rash.  Neurological:  Negative for seizures and syncope.  All other systems reviewed and are negative.   Physical Exam Updated Vital Signs BP (!) 135/93   Pulse 89   Temp 99.1 F (37.3 C) (Oral)   Resp 16   Ht 5\' 9"  (1.753 m)   Wt 81.6 kg   SpO2 97%   BMI 26.58 kg/m   Physical Exam Vitals and nursing note reviewed. Exam conducted with a chaperone present.  Constitutional:      Appearance: He is well-developed. He is not ill-appearing or toxic-appearing.  HENT:     Head: Normocephalic and atraumatic.     Mouth/Throat:     Mouth: Mucous membranes are dry.  Eyes:     Conjunctiva/sclera: Conjunctivae normal.  Cardiovascular:     Rate and Rhythm: Regular rhythm. Tachycardia present.     Heart sounds: No murmur heard.   Pulmonary:     Effort: Pulmonary effort is normal. No respiratory distress.     Breath sounds: Normal breath sounds.  Abdominal:     General: Abdomen is flat.     Palpations: Abdomen is soft.     Tenderness: There is generalized abdominal tenderness. There is guarding (voluntary). There is no rebound.     Hernia: No hernia is present.     Comments: Significant lower abdominal tenderness, particularly in the left lower quadrant.  Genitourinary:    Testes: Normal.  Musculoskeletal:     Cervical back: Neck supple.  Skin:    General: Skin is warm and dry.  Neurological:     Mental Status: He is alert.  Psychiatric:        Behavior:  Behavior is cooperative.     ED Results / Procedures / Treatments   Labs (all labs ordered are listed, but only abnormal results are displayed) Labs Reviewed  COMPREHENSIVE METABOLIC PANEL - Abnormal; Notable for the following components:      Result Value   Sodium 131 (*)    Chloride 95 (*)    CO2 21 (*)    Glucose, Bld 208 (*)    AST 14 (*)    All other components within normal limits  CBC WITH DIFFERENTIAL/PLATELET - Abnormal; Notable for the following components:   Neutro Abs 8.6 (*)    All other components within normal limits  LACTIC ACID, PLASMA - Abnormal; Notable for the following components:   Lactic Acid, Venous 2.4 (*)    All other components within normal limits  URINALYSIS, ROUTINE W REFLEX MICROSCOPIC - Abnormal; Notable for the following components:   Color, Urine AMBER (*)    APPearance CLOUDY (*)    Specific Gravity, Urine 1.033 (*)    Glucose, UA 50 (*)    Ketones, ur 20 (*)    Protein, ur 30 (*)    Bacteria, UA FEW (*)    All other components within normal limits  CBG MONITORING, ED - Abnormal; Notable for the following components:   Glucose-Capillary 170 (*)    All other components within normal limits  GASTROINTESTINAL PANEL BY PCR, STOOL (REPLACES STOOL CULTURE)  C DIFFICILE QUICK SCREEN W PCR REFLEX  CULTURE, BLOOD (ROUTINE X 2)  CULTURE, BLOOD (ROUTINE X 2)  SARS CORONAVIRUS 2 (TAT 6-24 HRS)  LIPASE, BLOOD  LACTIC ACID, PLASMA  CBC  BASIC METABOLIC PANEL  TROPONIN I (HIGH SENSITIVITY)    EKG None  Radiology CT ABDOMEN PELVIS WO CONTRAST  Result Date: 07/13/2020 CLINICAL DATA:  Lower abdominal pain for several days EXAM: CT ABDOMEN AND PELVIS WITHOUT  CONTRAST TECHNIQUE: Multidetector CT imaging of the abdomen and pelvis was performed following the standard protocol without IV contrast. COMPARISON:  07/06/2020 FINDINGS: Lower chest: Lung bases are free of acute infiltrate or sizable effusion. Previously seen pulmonary nodules are not within  the field of view. Hepatobiliary: Liver is within normal limits. Gallbladder is well distended without wall thickening or pericholecystic fluid. Pancreas: Fullness in the region of the pancreatic head is again seen and stable. Focal hypodensity is noted. Pancreatic duct is dilated but stable in appearance. Spleen: Normal in size without focal abnormality. Adrenals/Urinary Tract: Adrenal glands are within normal limits. Kidneys are well visualized bilaterally. No renal calculi or obstructive changes are seen. Bladder is decompressed. Stomach/Bowel: New rectal wall thickening and perirectal inflammatory changes are noted. These inflammatory changes extend superiorly into the sigmoid and descending colon. Transverse colon and ascending colon appear within normal limits. The appendix is within normal limits. No small bowel or gastric abnormality is noted. Vascular/Lymphatic: Atherosclerotic calcifications are noted. No sizable lymphadenopathy is noted. Reproductive: Prostate is unremarkable. Other: No abdominal wall hernia or abnormality. No abdominopelvic ascites. Musculoskeletal: No acute or significant osseous findings. Degenerative changes are noted. IMPRESSION: New colonic wall thickening and pericolonic inflammatory changes involving the distal colon from the mid descending portion to the level of the anus. These changes are most consistent with focal colitis. No abscess or perforation is noted. Stable changes in the region of the head of the pancreas similar to that seen on prior exam. Electronically Signed   By: Inez Catalina M.D.   On: 07/13/2020 18:45    Procedures Procedures   Medications Ordered in ED Medications  cefTRIAXone (ROCEPHIN) 2 g in sodium chloride 0.9 % 100 mL IVPB (has no administration in time range)  metroNIDAZOLE (FLAGYL) IVPB 500 mg (500 mg Intravenous New Bag/Given 07/13/20 2251)  0.9 %  sodium chloride infusion ( Intravenous New Bag/Given 07/13/20 2240)  ondansetron (ZOFRAN)  injection 4 mg (has no administration in time range)  HYDROmorphone (DILAUDID) injection 1 mg (1 mg Intravenous Given 07/13/20 2237)  LORazepam (ATIVAN) tablet 0.5 mg (0.5 mg Oral Given 07/13/20 2238)  insulin glargine (LANTUS) injection 20 Units (20 Units Subcutaneous Given 07/13/20 2254)  levothyroxine (SYNTHROID) tablet 137 mcg (has no administration in time range)  lipase/protease/amylase (CREON) capsule 72,000 Units (has no administration in time range)  pantoprazole (PROTONIX) EC tablet 40 mg (has no administration in time range)  insulin aspart (novoLOG) injection 0-9 Units (has no administration in time range)  insulin aspart (novoLOG) injection 0-5 Units (0 Units Subcutaneous Not Given 07/13/20 2236)  enoxaparin (LOVENOX) injection 40 mg (has no administration in time range)  acetaminophen (TYLENOL) tablet 650 mg (has no administration in time range)    Or  acetaminophen (TYLENOL) suppository 650 mg (has no administration in time range)  HYDROmorphone (DILAUDID) injection 1 mg (1 mg Intravenous Given 07/13/20 1851)  lactated ringers bolus 1,000 mL (0 mLs Intravenous Stopped 07/13/20 2049)  ondansetron (ZOFRAN) injection 4 mg (4 mg Intravenous Given 07/13/20 1849)  cefTRIAXone (ROCEPHIN) 2 g in sodium chloride 0.9 % 100 mL IVPB (0 g Intravenous Stopped 07/13/20 2253)    ED Course  I have reviewed the triage vital signs and the nursing notes.  Pertinent labs & imaging results that were available during my care of the patient were reviewed by me and considered in my medical decision making (see chart for details).    MDM Rules/Calculators/A&P  This is a 65yo M with a history of DM, HTN, HLD, CAD, stage IV pancreatic cancer recently starting chemotherapy 2 weeks ago who presented to the ED with abdominal pain worsening over the last 3 days and associated with diarrhea, nausea without vomiting. Low index of suspicion for ACS - would be a very atypical  presentation. EKG without ischemic changes as above. No indication for cardiac biomarkers.  I am considering diverticulitis, pancreatitis, volvulus, ischemic bowel, malrotation. Lower suspicion for SBO given lack of surgical history. No concerns for testicular torsion. He was in significant pain - ordered for IV pain and nausea medication. Also suspect relative hypovolemia and ordered for a crystalloid bolus. CT abdomen/pelvis with evidence of developing colitis when compared to previous from 1 week ago.  ED course: Lactic acidosis present - will repeat after crystalloid. No significant leukocytosis though left shift developing likely in the setting of colitis. Metabolic panel c/w hypovolemia. Renal function worse than baseline however not technically meeting criteria for AKI. Likely pre-renal. On reassessment, symptoms improved after treatment as above. I do not think he is septic. Unclear if colitis is infectious or inflammatory in nature. No significant risk factor for c diff. Will send GI pathogen panel and give a dose of IV abx in the ED.  He needs admission for symptomatic control and further w/u of colitis.    Final Clinical Impression(s) / ED Diagnoses Final diagnoses:  Colitis, acute    Rx / DC Orders ED Discharge Orders    None       Pearson Grippe, DO 07/14/20 0006    Tegeler, Gwenyth Allegra, MD 07/16/20 1109

## 2020-07-13 NOTE — H&P (Signed)
History and Physical    Timothy Page JGG:[id number] DOB: 04-Feb-1956 DOA: 07/13/2020  PCP: [name], MD Patient coming from: Home  Chief Complaint: Abdominal pain  HPI: Timothy Page is a 65 y.o. 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  In the ED, patient tachycardic on arrival but afebrile.  Not hypotensive.  Labs showing WBC 9.9, hemoglobin 16.1, platelet count 226K.  Sodium 131, potassium 3.9, chloride 95, bicarb 21, BUN 16, creatinine 1.2, glucose 288.  No elevation of lipase and LFTs.  Initial lactic acid 2.4, repeat pending.  UA not suggestive of infection.  GI pathogen panel pending.  CT showing findings consistent with focal colitis involving the distal colon.  No abscess or perforation. Patient was given ceftriaxone, Zofran, Dilaudid, and 1 L LR bolus.  Patient reports 3-day history of severe bilateral lower quadrant abdominal pain, nausea without vomiting, and diarrhea.  He is having chills and sweating.  He is not eating.  No recent antibiotic use.  His last chemo was 10 days ago.  No other complaints.  Denies cough, shortness of breath, or chest pain.  He is not vaccinated against COVID.  Review of Systems:  All systems reviewed and apart from history of presenting illness, are negative.  Past Medical History:  Diagnosis Date  . Arthritis   . Atypical chest pain 04/2017   ACS ruled out 05/11/17  . CAP (community acquired pneumonia) 05/07/2017  . Diabetes mellitus without complication ([place])    Poor control starting fall 2021-->GAD ab neg.  Insulin and C peptide levels wnl but on lower end of normal->Dr Kuma/endo 2022.  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 Hx of adenomatous polyp of colon 07/02/2009   rpt  colonoscopy 2026  . Hyperlipidemia    Intol of multiple statins and zetia. [facility] 2022.  [name] Hypertension   . Lipoma    back  . Myocardial infarction ([place])    MI at age 60  . Pancreatic adenocarcinoma ([place]) 06/2020   Mets to liver (?and lungs?), w/acute pancreatitis.  [name] Perianal abscess 11/2019   I&D'd by Dr.Thompson, gen surg, in the ED 11/23/19  . 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 Cammy Copa  . Right ureteral calculus 04/2018   Cystoscopy with right retrograde pyelogram and right ureteral stent placement after stone removal.  . Seasonal allergies   . Stroke ([place])    in 55Y no complications   . Tachyarrhythmia 1980   age 35 X 2 over 40 month period.  No recurrence since that time.  . Thoracic aortic aneurysm ([place])    Incidentally discovered, 4.1 cm-->Dr. Cyndia Bent. Rpt CT 08/2018 and 08/2019 stable.  . Vocal cord paralysis 07/2016   Left vocal cord paralysis noted after thyroid surgery.    Past Surgical History:  Procedure Laterality Date  . BIOPSY  06/18/2020   Procedure: BIOPSY;  Surgeon: Rush Landmark Telford Nab., MD;  Location: [place];  Service: Gastroenterology;;  . [place]  . COLONOSCOPY W/ POLYPECTOMY  07/02/2009; 01/24/20   2011 adenoma.  2021 adenomas x 3->recall 2026  . CYSTOSCOPY WITH RETROGRADE PYELOGRAM, URETEROSCOPY AND STENT PLACEMENT Right 05/09/2018   Procedure: CYSTOSCOPY WITH RIGHT RETROGRADE RIGHT URETEROSCOPY STONE EXTRACTION RIGHT STENT EXCHANGE;  Surgeon: Lucas Mallow, MD;  Location: [facility];  Service: Urology;  Laterality: Right;  . CYSTOSCOPY/RETROGRADE/URETEROSCOPY/STONE EXTRACTION WITH BASKET Right 04/30/2018   Procedure: CYSTOSCOPY/RETROGRADE/RIGHT URETEROSCOPY/;  Surgeon: Lucas Mallow, MD;  Location: [facility];  Service: Urology;  Laterality: Right;  . ESOPHAGOGASTRODUODENOSCOPY  07/02/2009   NORMAL  . ESOPHAGOGASTRODUODENOSCOPY (EGD) WITH PROPOFOL N/A 06/18/2020    Procedure: ESOPHAGOGASTRODUODENOSCOPY (EGD) WITH PROPOFOL;  Surgeon: Rush Landmark Telford Nab., MD;  Location: [place];  Service: Gastroenterology;  Laterality: N/A;  . EYE SURGERY    . FINE NEEDLE ASPIRATION  06/18/2020   Procedure: FINE NEEDLE ASPIRATION (FNA) LINEAR;  Surgeon: Irving Copas., MD;  Location: [place];  Service: Gastroenterology;;  . finger amputaion     with reattachement  . INCISION AND DRAINAGE ABSCESS ANAL  11/23/2019   gen surg (In ED).  . IR IMAGING GUIDED PORT INSERTION  06/22/2020  . LASIK    . THYROIDECTOMY N/A 08/03/2016   Procedure: Total THYROIDECTOMY;  Surgeon: [name], MD;  Location: [place];  Service: ENT;  Laterality: N/A;  Total Thyroidectomy   . UPPER ESOPHAGEAL ENDOSCOPIC ULTRASOUND (EUS) N/A 06/18/2020   Procedure: UPPER ESOPHAGEAL ENDOSCOPIC ULTRASOUND (EUS);  Surgeon: Irving Copas., MD;  Location: [place];  Service: Gastroenterology;  Laterality: N/A;  . UPPER GASTROINTESTINAL ENDOSCOPY     with EUS and bx of pancreatic mass. +appearance of candida in esoph.       reports that he has never smoked. His smokeless tobacco use includes snuff. He reports that he does not drink alcohol and does not use drugs.  Allergies  Allergen Reactions  . Atenolol     Severe "slowing"of functioning Severe "slowing"of functioning  . Hydroxyzine Other (See Comments)    "felt like I was coming out of my skin"  . Tetracycline     Rash Because of a history of documented adverse serious drug reaction;Medi Alert bracelet  is recommended Rash Because of a history of documented adverse serious drug reaction;Medi Alert braceletis recommended  . Atorvastatin     D/Ced by him due to Myalgias in Sept 2014 D/Ced by him due to Myalgias in Sept 2014  . Codeine     Hallucinations. "I see pink elephants"  . Guaifenesin     Other reaction(s): Other (See Comments) unknown unknown  . Mucinex [Guaifenesin Er] Other (See Comments)     Hallucinations "I see pink elephants"    Family History  Problem Relation Age of Onset  . Diabetes Mother   . Cancer Mother        lung  . COPD Mother   . Brain cancer Mother   . Arthritis Mother   . Hyperlipidemia Mother   . Heart disease Mother   . Hypertension Mother   . Diabetes Father   . Cancer Father        mesothelioma; skin  . Mental illness Father   . Arthritis Father   . Hyperlipidemia Father   . Heart disease Father   . Hypertension Father   . Heart disease Maternal Grandmother        MI @ 10  . Heart disease Maternal Grandfather        MI @ 80  . Diabetes Paternal Grandmother   . Dementia Paternal Grandmother   . Heart disease Paternal Grandfather        MI @ 42  . Colon cancer Neg Hx   . Esophageal cancer Neg Hx   . Rectal cancer Neg Hx   . Stomach cancer Neg Hx  Prior to Admission medications   Medication Sig Start Date End Date Taking? Authorizing Provider  insulin glargine (LANTUS SOLOSTAR) 100 UNIT/ML Solostar Pen Give up to 10 units under the skin every evening at bedtime. Patient taking differently: Inject 20 Units into the skin at bedtime. 06/22/20  Yes Charlynne Cousins, MD  lactulose (CEPHULAC) 10 g packet Take 10 g by mouth daily.   Yes [provider]  levothyroxine (SYNTHROID) 137 MCG tablet TAKE 1 TABLET BY MOUTH  DAILY BEFORE BREAKFAST Patient taking differently: Take 137 mcg by mouth daily before breakfast. 06/03/20  Yes Elayne Snare, MD  lipase/protease/amylase (CREON) 36000 UNITS CPEP capsule Take 2 capsules (72,000 Units total) by mouth 3 (three) times daily with meals. May also take 1 capsule (36,000 Units total) as needed (with snacks). 06/30/20  Yes Iruku, Arletha Pili, MD  LORazepam (ATIVAN) 0.5 MG tablet Take 1 tablet (0.5 mg total) by mouth every 6 (six) hours as needed for anxiety. 06/29/20  Yes [name], MD  morphine (MS CONTIN) 15 MG 12 hr tablet Take 1 tablet (15 mg total) by mouth every 12 (twelve) hours. For cancer  pain 07/06/20  Yes Iruku, Arletha Pili, MD  omeprazole (PRILOSEC) 20 MG capsule Take 1 capsule (20 mg total) by mouth daily. 06/30/20  Yes Iruku, Arletha Pili, MD  ondansetron (ZOFRAN) 8 MG tablet Take 1 tablet (8 mg total) by mouth 2 (two) times daily as needed. Start on day 3 after chemotherapy. 06/29/20  Yes [name], MD  ONETOUCH ULTRA test strip USE TO CHECK BLOOD SUGAR  ONCE DAILY 06/22/20  Yes McGowen, Adrian Blackwater, MD  senna-docusate (SENOKOT-S) 8.6-50 MG tablet Take 1 tablet by mouth 2 (two) times daily.   Yes [provider]  cloNIDine (CATAPRES) 0.1 MG tablet 1 tab po twice daily x 4 days, then 1 tab po daily x 4 days, then stop. 07/02/20   McGowen, Adrian Blackwater, MD  cloNIDine (CATAPRES) 0.3 MG tablet Take 0.3 mg by mouth at bedtime. 07/03/20   [provider]  Continuous Blood Gluc Sensor (FREESTYLE LIBRE 2 SENSOR) MISC 2 Devices by Does not apply route every 14 (fourteen) days. 04/03/20   Elayne Snare, MD  dexamethasone (DECADRON) 4 MG tablet Take 2 tablets (8 mg total) by mouth daily. Start the day after chemotherapy for 3 days. Take with food. 06/29/20   [name], MD  insulin lispro (HUMALOG KWIKPEN) 100 UNIT/ML KwikPen INJECT SUBCUTANEOUSLY UP TO 12 UNITS BEFORE MEALS 3  TIMES DAILY 06/28/20   Elayne Snare, MD  Insulin Pen Needle 31G X 5 MM MISC Use with insulin pen 3 times a day 04/24/20   Elayne Snare, MD  Lancets [facility] DELICA PLUS [id number]) [place] USE TO CHECK BLOOD SUGAR  ONCE DAILY 06/22/20   McGowen, Adrian Blackwater, MD  lidocaine-prilocaine (EMLA) cream Apply to affected area once 06/29/20   [name], MD  loperamide (IMODIUM A-D) 2 MG tablet Take 2 at onset of diarrhea, then 1 every 2hrs until 12hr without a BM. May take 2 tab every 4hrs at bedtime. If diarrhea recurs repeat. 06/29/20   [name], MD  psyllium (METAMUCIL SMOOTH TEXTURE) 28 % packet Take 1 packet by mouth daily.    [provider]    Physical Exam: Vitals:   07/13/20 2000 07/13/20 2015  07/13/20 2030 07/13/20 2045  BP: (!) 146/92 (!) 152/95 135/87 (!) 135/93  Pulse: 89 83 88 89  Resp: 14 16 14 16   Temp:      TempSrc:  SpO2: 97% 96% 95% 97%  Weight:      Height:        Physical Exam Constitutional:      General: He is not in acute distress. HENT:     Head: Normocephalic and atraumatic.  Eyes:     Extraocular Movements: Extraocular movements intact.     Conjunctiva/sclera: Conjunctivae normal.  Cardiovascular:     Rate and Rhythm: Normal rate and regular rhythm.     Pulses: Normal pulses.  Pulmonary:     Effort: Pulmonary effort is normal. No respiratory distress.     Breath sounds: Normal breath sounds. No wheezing or rales.  Abdominal:     General: Bowel sounds are normal. There is no distension.     Palpations: Abdomen is soft.     Tenderness: There is abdominal tenderness. There is guarding.     Comments: Generalized tenderness to palpation with guarding  Musculoskeletal:        General: No swelling or tenderness.     Cervical back: Normal range of motion and neck supple.  Skin:    General: Skin is warm and dry.  Neurological:     General: No focal deficit present.     Mental Status: He is alert and oriented to person, place, and time.     Labs on Admission: I have personally reviewed following labs and imaging studies  CBC: Recent Labs  Lab 07/13/20 1824  WBC 9.9  NEUTROABS 8.6*  HGB 16.1  HCT 46.7  MCV 88.1  PLT 253   Basic Metabolic Panel: Recent Labs  Lab 07/13/20 1824  NA 131*  K 3.9  CL 95*  CO2 21*  GLUCOSE 208*  BUN 16  CREATININE 1.22  CALCIUM 9.4   GFR: Estimated Creatinine Clearance: 62 mL/min (by C-G formula based on SCr of 1.22 mg/dL). Liver Function Tests: Recent Labs  Lab 07/13/20 1824  AST 14*  ALT 13  ALKPHOS 82  BILITOT 0.9  PROT 7.6  ALBUMIN 4.1   Recent Labs  Lab 07/13/20 1824  LIPASE 21   No results for input(s): AMMONIA in the last 168 hours. Coagulation Profile: No results for  input(s): INR, PROTIME in the last 168 hours. Cardiac Enzymes: No results for input(s): CKTOTAL, CKMB, CKMBINDEX, TROPONINI in the last 168 hours. BNP (last 3 results) No results for input(s): PROBNP in the last 8760 hours. HbA1C: No results for input(s): HGBA1C in the last 72 hours. CBG: No results for input(s): GLUCAP in the last 168 hours. Lipid Profile: No results for input(s): CHOL, HDL, LDLCALC, TRIG, CHOLHDL, LDLDIRECT in the last 72 hours. Thyroid Function Tests: No results for input(s): TSH, T4TOTAL, FREET4, T3FREE, THYROIDAB in the last 72 hours. Anemia Panel: No results for input(s): VITAMINB12, FOLATE, FERRITIN, TIBC, IRON, RETICCTPCT in the last 72 hours. Urine analysis:    Component Value Date/Time   COLORURINE AMBER (A) 07/13/2020 1830   APPEARANCEUR CLOUDY (A) 07/13/2020 1830   LABSPEC 1.033 (H) 07/13/2020 1830   PHURINE 5.0 07/13/2020 1830   GLUCOSEU 50 (A) 07/13/2020 1830   GLUCOSEU NEGATIVE 09/13/2016 0946   HGBUR NEGATIVE 07/13/2020 1830   HGBUR negative 05/05/2009 1323   BILIRUBINUR NEGATIVE 07/13/2020 1830   BILIRUBINUR Negative 11/22/2019 1048   KETONESUR 20 (A) 07/13/2020 1830   PROTEINUR 30 (A) 07/13/2020 1830   UROBILINOGEN 0.2 11/22/2019 1048   UROBILINOGEN 0.2 09/13/2016 0946   NITRITE NEGATIVE 07/13/2020 1830   LEUKOCYTESUR NEGATIVE 07/13/2020 1830    Radiological Exams on Admission: CT ABDOMEN  PELVIS WO CONTRAST  Result Date: 07/13/2020 CLINICAL DATA:  Lower abdominal pain for several days EXAM: CT ABDOMEN AND PELVIS WITHOUT CONTRAST TECHNIQUE: Multidetector CT imaging of the abdomen and pelvis was performed following the standard protocol without IV contrast. COMPARISON:  07/06/2020 FINDINGS: Lower chest: Lung bases are free of acute infiltrate or sizable effusion. Previously seen pulmonary nodules are not within the field of view. Hepatobiliary: Liver is within normal limits. Gallbladder is well distended without wall thickening or  pericholecystic fluid. Pancreas: Fullness in the region of the pancreatic head is again seen and stable. Focal hypodensity is noted. Pancreatic duct is dilated but stable in appearance. Spleen: Normal in size without focal abnormality. Adrenals/Urinary Tract: Adrenal glands are within normal limits. Kidneys are well visualized bilaterally. No renal calculi or obstructive changes are seen. Bladder is decompressed. Stomach/Bowel: New rectal wall thickening and perirectal inflammatory changes are noted. These inflammatory changes extend superiorly into the sigmoid and descending colon. Transverse colon and ascending colon appear within normal limits. The appendix is within normal limits. No small bowel or gastric abnormality is noted. Vascular/Lymphatic: Atherosclerotic calcifications are noted. No sizable lymphadenopathy is noted. Reproductive: Prostate is unremarkable. Other: No abdominal wall hernia or abnormality. No abdominopelvic ascites. Musculoskeletal: No acute or significant osseous findings. Degenerative changes are noted. IMPRESSION: New colonic wall thickening and pericolonic inflammatory changes involving the distal colon from the mid descending portion to the level of the anus. These changes are most consistent with focal colitis. No abscess or perforation is noted. Stable changes in the region of the head of the pancreas similar to that seen on prior exam. Electronically Signed   By: Inez Catalina M.D.   On: 07/13/2020 18:45    EKG: Independently reviewed.  Sinus rhythm.  Borderline T wave abnormalities in lateral leads appear new compared to prior tracing.  Assessment/Plan Principal Problem:   Colitis Active Problems:   Essential hypertension   Adenocarcinoma of pancreas, stage 4 (HCC)   Diabetes ([place])   Hyponatremia   Colitis Patient is here for evaluation of abdominal pain, nausea, and diarrhea.  CT showing findings consistent with focal colitis involving the distal colon; no abscess or  perforation.  Mild tachycardia at the time of presentation to the ED and mild lactic acidosis on labs likely due to dehydration.  Tachycardia has now resolved after fluid bolus.  No fever, leukocytosis, or hypotension to suggest sepsis. -Ceftriaxone and Flagyl.  IV fluid hydration, antiemetic as needed, pain control.  Blood culture x2 ordered.  Trend lactate and monitor WBC count.  C. difficile PCR and GI pathogen panel.  Abnormal EKG Borderline T wave abnormalities in lateral leads appear new compared to prior tracing.  Patient is not endorsing any chest pain. -Cardiac monitoring, check high-sensitivity troponin level  Mild hyponatremia Likely due to poor oral intake. -IV fluid hydration, continue to monitor  Recently diagnosed stage IV pancreatic adenocarcinoma Followed by oncology and started on chemo. -Outpatient oncology follow-up  GERD  -Continue PPI  Insulin-dependent type 2 diabetes with hyperglycemia A1c 7.7 on 06/12/2020. -Continue home Lantus 20 units at bedtime.  Sliding scale insulin sensitive ACHS.   Hypertension Currently normotensive.  Not on any antihypertensives at home. -Monitor blood pressure closely  Hypothyroidism -Continue Synthroid  Anxiety -Continue Ativan as needed  DVT prophylaxis: Lovenox Code Status: Patient wishes to be DNR. Family Communication: Wife at bedside. Disposition Plan: Status is: Inpatient  Remains inpatient appropriate because:Ongoing active pain requiring inpatient pain management, IV treatments appropriate due to intensity of  illness or inability to take PO and Inpatient level of care appropriate due to severity of illness   Dispo: The patient is from: Home              Anticipated d/c is to: Home              Patient currently is not medically stable to d/c.   Difficult to place patient No  Level of care: Level of care: Telemetry Cardiac   The medical decision making on this patient was of high complexity and the patient is  at high risk for clinical deterioration, therefore this is a level 3 visit.  Shela Leff MD [facility]  If 7PM-7AM, please contact night-coverage [email or web address]  07/13/2020, 10:33 PM

## 2020-07-13 NOTE — ED Notes (Signed)
Pt is in room 14 now with wife at the bedside.  Pain is high, 7/10.  Gave pt a BSC so he may use it as needed.

## 2020-07-13 NOTE — ED Notes (Signed)
LA 2.4 per lab Primary RN and provider made aware.

## 2020-07-14 ENCOUNTER — Inpatient Hospital Stay: Payer: 59

## 2020-07-14 ENCOUNTER — Inpatient Hospital Stay: Payer: 59 | Admitting: Hematology and Oncology

## 2020-07-14 DIAGNOSIS — E871 Hypo-osmolality and hyponatremia: Secondary | ICD-10-CM

## 2020-07-14 DIAGNOSIS — K529 Noninfective gastroenteritis and colitis, unspecified: Secondary | ICD-10-CM | POA: Diagnosis not present

## 2020-07-14 DIAGNOSIS — I10 Essential (primary) hypertension: Secondary | ICD-10-CM | POA: Diagnosis not present

## 2020-07-14 DIAGNOSIS — C259 Malignant neoplasm of pancreas, unspecified: Secondary | ICD-10-CM | POA: Diagnosis not present

## 2020-07-14 LAB — GASTROINTESTINAL PANEL BY PCR, STOOL (REPLACES STOOL CULTURE)

## 2020-07-14 LAB — BASIC METABOLIC PANEL
Anion gap: 10 (ref 5–15)
BUN: 14 mg/dL (ref 8–23)
CO2: 26 mmol/L (ref 22–32)
Calcium: 8.6 mg/dL — ABNORMAL LOW (ref 8.9–10.3)
Chloride: 98 mmol/L (ref 98–111)
Creatinine, Ser: 1.04 mg/dL (ref 0.61–1.24)
GFR, Estimated: >60 mL/min (ref >60)
Glucose, Bld: 150 mg/dL — ABNORMAL HIGH (ref 70–99)
Potassium: 3.6 mmol/L (ref 3.5–5.1)
Sodium: 134 mmol/L — ABNORMAL LOW (ref 135–145)

## 2020-07-14 LAB — CBC
HCT: 36.2 % — ABNORMAL LOW (ref 39.0–52.0)
Hemoglobin: 12.2 g/dL — ABNORMAL LOW (ref 13.0–17.0)
MCH: 30 pg (ref 26.0–34.0)
MCHC: 33.7 g/dL (ref 30.0–36.0)
MCV: 88.9 fL (ref 80.0–100.0)
Platelets: 165 10*3/uL (ref 150–400)
RBC: 4.07 MIL/uL — ABNORMAL LOW (ref 4.22–5.81)
RDW: 12.2 % (ref 11.5–15.5)
WBC: 6.9 10*3/uL (ref 4.0–10.5)
nRBC: 0 % (ref 0.0–0.2)

## 2020-07-14 LAB — C DIFFICILE QUICK SCREEN W PCR REFLEX
C Diff antigen: NEGATIVE
C Diff interpretation: NOT DETECTED
C Diff toxin: NEGATIVE

## 2020-07-14 LAB — TROPONIN I (HIGH SENSITIVITY): Troponin I (High Sensitivity): 9 ng/L (ref <18)

## 2020-07-14 LAB — GLUCOSE, CAPILLARY
Glucose-Capillary: 162 mg/dL — ABNORMAL HIGH (ref 70–99)
Glucose-Capillary: 176 mg/dL — ABNORMAL HIGH (ref 70–99)
Glucose-Capillary: 145 mg/dL — ABNORMAL HIGH (ref 70–99)

## 2020-07-14 LAB — CBG MONITORING, ED: Glucose-Capillary: 164 mg/dL — ABNORMAL HIGH (ref 70–99)

## 2020-07-14 LAB — SARS CORONAVIRUS 2 (TAT 6-24 HRS): SARS Coronavirus 2: NEGATIVE

## 2020-07-14 MED ORDER — HYDRALAZINE HCL 25 MG PO TABS: 25.0000 mg | ORAL_TABLET | Freq: Four times a day (QID) | ORAL | Status: DC | PRN

## 2020-07-14 MED ORDER — SODIUM CHLORIDE 0.9 % IV SOLN
INTRAVENOUS | Status: AC
  Administered 2020-07-14: 100 mL/h via INTRAVENOUS

## 2020-07-14 NOTE — ED Notes (Signed)
Breakfast order placed ?

## 2020-07-14 NOTE — Progress Notes (Deleted)
Timothy Page FOLLOW UP NOTE  Patient Care Team: [name], MD as PCP - General (Hematology and Oncology) Gatha Mayer, MD as Consulting Physician (Gastroenterology) [name], MD as Consulting Physician (Otolaryngology) Elayne Snare, MD as Consulting Physician (Endocrinology) Gaye Pollack, MD as Consulting Physician (Cardiothoracic Surgery) Lucas Mallow, MD as Consulting Physician (Urology) Debara Pickett Nadean Corwin, MD as Consulting Physician (Cardiology) [name], MD as Consulting Physician (Hematology and Oncology) Jonnie Finner, RN as Oncology Nurse Navigator  CHIEF COMPLAINTS/PURPOSE OF CONSULTATION:  Pancreatic cancer, follow up prior to 1 st cycle of FOLFIRINOX  ASSESSMENT & PLAN:  No problem-specific Assessment & Plan notes found for this encounter.  No orders of the defined types were placed in this encounter.    HISTORY OF PRESENTING ILLNESS:  Timothy Page 65 y.o. male is here because of new diagnosis of pancreatic cancer.  Oncology History  Adenocarcinoma of pancreas, stage 4 ([place])  06/20/2020 Initial Diagnosis   Adenocarcinoma of pancreas, stage 4 ([place])   07/01/2020 -  Chemotherapy    Patient is on Treatment Plan: PANCREAS MODIFIED FOLFIRINOX Q14D X 4 CYCLES      07/01/2020 Genetic Testing   Negative genetic testing on a custom panel reviewing multicancer and pancreatitis genes.  The Custom-Gene Panel offered by [facility] includes sequencing and/or deletion duplication testing of the following 91 genes: AIP, ALK, APC*, ATM*, AXIN2, BAP1, BARD1, BLM, BMPR1A, BRCA1, BRCA2, BRIP1, CASR, CDC73, CDH1, CDK4, CDKN1B, CDKN1C, CDKN2A (p14ARF), CDKN2A (p16INK4a), CEBPA, CFTR*, CHEK2, CPA1, CTNNA1, CTRC, DICER1*, DIS3L2, EGFR, EPCAM*, FANCC, FH*, FLCN, GATA2, GPC3*, GREM1*, HOXB13, HRAS, KIT, MAX*, MEN1*, MET*, MITF*, MLH1*, MSH2*, MSH3*, MSH6*, MUTYH, NBN, NF1*, NF2, NTHL1, PALB2, PALLD, PDGFRA, PHOX2B*, PMS2*, POLD1*, POLE, POT1, PRKAR1A,  PRSS1*, PTCH1, PTEN*, RAD50, RAD51C, RAD51D, RB1*, RECQL4*, RET, RUNX1, SDHA*, SDHAF2, SDHB, SDHC*, SDHD, SMAD4, SMARCA4, SMARCB1, SMARCE1, SPINK1, STK11, SUFU, TERC, TERT, TMEM127, TP53, TSC1*, TSC2, VHL, WRN*, and WT1.  The report date is Jul 01, 2020.    INTERIM HISTORY  [name] is here for follow-up with his wife. He states that his stomach pain has been quite uncontrolled.  He has been feeling really bad from the pain aspect.  Morphine works for about 7 to [place].  Oxycodone has not been helping his stomach pain.  Every time he eats he has excruciating pain and this limits him from eating.  Since he came out of the hospital, he lost about 5 pounds of weight.  He has occasional episodes of heartburn, especially when he does not eat, his whole stomach feels like it is burning up. He will start his first cycle of chemotherapy tomorrow. Patient describes loose bowel movements, not necessarily diarrhea, last bowel movement 2 days ago. No change in urinary habits.  No leg swelling.  Rest of the pertinent 10 point ROS reviewed and negative.  MEDICAL HISTORY:  Past Medical History:  Diagnosis Date  . Arthritis   . Atypical chest pain 04/2017   ACS ruled out 05/11/17  . CAP (community acquired pneumonia) 05/07/2017  . Diabetes mellitus without complication ([place])    Poor control starting fall 2021-->GAD ab neg.  Insulin and C peptide levels wnl but on lower end of normal->Dr Kuma/endo 2022.  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 Hx of adenomatous polyp of colon 07/02/2009   rpt colonoscopy 2026  . Hyperlipidemia    Intol of multiple statins and zetia. [facility] 2022.  [name] Hypertension   .  Lipoma    back  . Myocardial infarction ([place])    MI at age 48  . Pancreatic adenocarcinoma ([place]) 06/2020   Mets to liver (?and lungs?), w/acute pancreatitis.  [name] Perianal abscess 11/2019   I&D'd by Dr.Thompson, gen surg, in the ED 11/23/19  . 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 Cammy Copa  . Right ureteral calculus 04/2018   Cystoscopy with right retrograde pyelogram and right ureteral stent placement after stone removal.  . Seasonal allergies   . Stroke ([place])    in 37T no complications   . Tachyarrhythmia 1980   age 20 X 2 over 64 month period.  No recurrence since that time.  . Thoracic aortic aneurysm ([place])    Incidentally discovered, 4.1 cm-->Dr. Cyndia Bent. Rpt CT 08/2018 and 08/2019 stable.  . Vocal cord paralysis 07/2016   Left vocal cord paralysis noted after thyroid surgery.    SURGICAL HISTORY: Past Surgical History:  Procedure Laterality Date  . BIOPSY  06/18/2020   Procedure: BIOPSY;  Surgeon: Rush Landmark Telford Nab., MD;  Location: [facility];  Service: Gastroenterology;;  . [place]  . COLONOSCOPY W/ POLYPECTOMY  07/02/2009; 01/24/20   2011 adenoma.  2021 adenomas x 3->recall 2026  . CYSTOSCOPY WITH RETROGRADE PYELOGRAM, URETEROSCOPY AND STENT PLACEMENT Right 05/09/2018   Procedure: CYSTOSCOPY WITH RIGHT RETROGRADE RIGHT URETEROSCOPY STONE EXTRACTION RIGHT STENT EXCHANGE;  Surgeon: Lucas Mallow, MD;  Location: [facility];  Service: Urology;  Laterality: Right;  . CYSTOSCOPY/RETROGRADE/URETEROSCOPY/STONE EXTRACTION WITH BASKET Right 04/30/2018   Procedure: CYSTOSCOPY/RETROGRADE/RIGHT URETEROSCOPY/;  Surgeon: Lucas Mallow, MD;  Location: [facility];  Service: Urology;  Laterality: Right;  . ESOPHAGOGASTRODUODENOSCOPY  07/02/2009   NORMAL  . ESOPHAGOGASTRODUODENOSCOPY (EGD) WITH PROPOFOL N/A 06/18/2020   Procedure: ESOPHAGOGASTRODUODENOSCOPY (EGD) WITH PROPOFOL;  Surgeon: Rush Landmark Telford Nab., MD;  Location: [facility];  Service: Gastroenterology;  Laterality: N/A;  . EYE SURGERY    . FINE NEEDLE ASPIRATION  06/18/2020   Procedure: FINE NEEDLE ASPIRATION (FNA) LINEAR;  Surgeon: Irving Copas., MD;  Location: [facility];  Service: Gastroenterology;;   . finger amputaion     with reattachement  . INCISION AND DRAINAGE ABSCESS ANAL  11/23/2019   gen surg (In ED).  . IR IMAGING GUIDED PORT INSERTION  06/22/2020  . LASIK    . THYROIDECTOMY N/A 08/03/2016   Procedure: Total THYROIDECTOMY;  Surgeon: [name], MD;  Location: [facility];  Service: ENT;  Laterality: N/A;  Total Thyroidectomy   . UPPER ESOPHAGEAL ENDOSCOPIC ULTRASOUND (EUS) N/A 06/18/2020   Procedure: UPPER ESOPHAGEAL ENDOSCOPIC ULTRASOUND (EUS);  Surgeon: Irving Copas., MD;  Location: [facility];  Service: Gastroenterology;  Laterality: N/A;  . UPPER GASTROINTESTINAL ENDOSCOPY     with EUS and bx of pancreatic mass. +appearance of candida in esoph.      SOCIAL HISTORY: Social History   Socioeconomic History  . Marital status: Married    Spouse name: Not on file  . Number of children: Not on file  . Years of education: Not on file  . Highest education level: Not on file  Occupational History  . Not on file  Tobacco Use  . Smoking status: Never Smoker  . Smokeless tobacco: Current User    Types: Snuff  Vaping Use  . Vaping Use: Never used  Substance and Sexual Activity  . Alcohol use: No  . Drug use: No  . Sexual activity: Not on file  Other Topics Concern  . Not  on file  Social History Narrative   Married, 2 daughters, 1 grandson that lives with him.   Educ: some college   Occup: retired from the [employer].  Retired at 12 but returned to work at some point, now with grandson living with him.   No T/A/Ds.   Social Determinants of Health   Financial Resource Strain: Not on file  Food Insecurity: Not on file  Transportation Needs: Not on file  Physical Activity: Not on file  Stress: Not on file  Social Connections: Not on file  Intimate Partner Violence: Not on file    FAMILY HISTORY: Family History  Problem Relation Age of Onset  . Diabetes Mother   . Cancer Mother        lung  . COPD Mother   . Brain cancer Mother   . Arthritis  Mother   . Hyperlipidemia Mother   . Heart disease Mother   . Hypertension Mother   . Diabetes Father   . Cancer Father        mesothelioma; skin  . Mental illness Father   . Arthritis Father   . Hyperlipidemia Father   . Heart disease Father   . Hypertension Father   . Heart disease Maternal Grandmother        MI @ 70  . Heart disease Maternal Grandfather        MI @ 56  . Diabetes Paternal Grandmother   . Dementia Paternal Grandmother   . Heart disease Paternal Grandfather        MI @ 20  . Colon cancer Neg Hx   . Esophageal cancer Neg Hx   . Rectal cancer Neg Hx   . Stomach cancer Neg Hx     ALLERGIES:  is allergic to atenolol, hydroxyzine, tetracycline, atorvastatin, codeine, guaifenesin, and mucinex [guaifenesin er].  MEDICATIONS:  No current facility-administered medications for this visit.   Current Outpatient Medications  Medication Sig Dispense Refill  . Continuous Blood Gluc Sensor (FREESTYLE LIBRE 2 SENSOR) MISC 2 Devices by Does not apply route every 14 (fourteen) days. 2 each 3  . insulin glargine (LANTUS SOLOSTAR) 100 UNIT/ML Solostar Pen Give up to 10 units under the skin every evening at bedtime. (Patient taking differently: Inject 20 Units into the skin at bedtime.) 15 mL 1  . Insulin Pen Needle 31G X 5 MM MISC Use with insulin pen 3 times a day 100 each 1  . lactulose (CEPHULAC) 10 g packet Take 10 g by mouth daily.    . Lancets (ONETOUCH DELICA PLUS [id number]) MISC USE TO CHECK BLOOD SUGAR  ONCE DAILY 100 each 3  . levothyroxine (SYNTHROID) 137 MCG tablet TAKE 1 TABLET BY MOUTH  DAILY BEFORE BREAKFAST (Patient taking differently: Take 137 mcg by mouth daily before breakfast.) 90 tablet 3  . lipase/protease/amylase (CREON) 36000 UNITS CPEP capsule Take 2 capsules (72,000 Units total) by mouth 3 (three) times daily with meals. May also take 1 capsule (36,000 Units total) as needed (with snacks). 240 capsule 11  . LORazepam (ATIVAN) 0.5 MG tablet Take 1 tablet  (0.5 mg total) by mouth every 6 (six) hours as needed for anxiety. 30 tablet 0  . morphine (MS CONTIN) 15 MG 12 hr tablet Take 1 tablet (15 mg total) by mouth every 12 (twelve) hours. For cancer pain 60 tablet 0  . omeprazole (PRILOSEC) 20 MG capsule Take 1 capsule (20 mg total) by mouth daily. 30 capsule 0  . ondansetron (ZOFRAN) 8 MG tablet Take  1 tablet (8 mg total) by mouth 2 (two) times daily as needed. Start on day 3 after chemotherapy. 30 tablet 1  . ONETOUCH ULTRA test strip USE TO CHECK BLOOD SUGAR  ONCE DAILY 100 strip 3  . senna-docusate (SENOKOT-S) 8.6-50 MG tablet Take 1 tablet by mouth 2 (two) times daily.     Facility-Administered Medications Ordered in Other Visits  Medication Dose Route Frequency Provider Last Rate Last Admin  . 0.9 %  sodium chloride infusion   Intravenous Continuous Shela Leff, MD 125 mL/hr at 07/13/20 2240 New Bag at 07/13/20 2240  . acetaminophen (TYLENOL) tablet 650 mg  650 mg Oral Q6H PRN Shela Leff, MD       Or  . acetaminophen (TYLENOL) suppository 650 mg  650 mg Rectal Q6H PRN Shela Leff, MD      . cefTRIAXone (ROCEPHIN) 2 g in sodium chloride 0.9 % 100 mL IVPB  2 g Intravenous Q24H Shela Leff, MD      . enoxaparin (LOVENOX) injection 40 mg  40 mg Subcutaneous Q24H Shela Leff, MD   40 mg at 07/14/20 2992  . HYDROmorphone (DILAUDID) injection 1 mg  1 mg Intravenous Q4H PRN Shela Leff, MD   1 mg at 07/14/20 0806  . insulin aspart (novoLOG) injection 0-5 Units  0-5 Units Subcutaneous QHS Shela Leff, MD      . insulin aspart (novoLOG) injection 0-9 Units  0-9 Units Subcutaneous TID WC Shela Leff, MD      . insulin glargine (LANTUS) injection 20 Units  20 Units Subcutaneous QHS Shela Leff, MD   20 Units at 07/13/20 2254  . levothyroxine (SYNTHROID) tablet 137 mcg  137 mcg Oral QAC breakfast Shela Leff, MD   137 mcg at 07/14/20 [id number]  . lipase/protease/amylase (CREON) capsule 72,000  Units  72,000 Units Oral TID WC Shela Leff, MD   72,000 Units at 07/14/20 0809  . LORazepam (ATIVAN) tablet 0.5 mg  0.5 mg Oral Q6H PRN Shela Leff, MD   0.5 mg at 07/13/20 2238  . metroNIDAZOLE (FLAGYL) IVPB 500 mg  500 mg Intravenous Quay Burow, MD   Stopped at 07/14/20 0750  . ondansetron (ZOFRAN) injection 4 mg  4 mg Intravenous Q6H PRN Shela Leff, MD      . pantoprazole (PROTONIX) EC tablet 40 mg  40 mg Oral Daily Shela Leff, MD          PHYSICAL EXAMINATION: ECOG PERFORMANCE STATUS: 1 - Symptomatic but completely ambulatory  There were no vitals filed for this visit. There were no vitals filed for this visit.  GENERAL:alert, no distress and comfortable SKIN: skin color, texture, turgor are normal, no rashes or significant lesions EYES: normal, conjunctiva are pink and non-injected, sclera clear OROPHARYNX:no exudate, no erythema and lips, buccal mucosa, and tongue normal  NECK: supple, thyroid normal size, non-tender, without nodularity LYMPH:  no palpable lymphadenopathy in the cervical, axillary or inguinal LUNGS: clear to auscultation and percussion with normal breathing effort HEART: regular rate & rhythm and no murmurs and no lower extremity edema ABDOMEN: Severe abdominal pain in both upper quadrants and bandlike radiating to the back.   Musculoskeletal:no cyanosis of digits and no clubbing  PSYCH: alert & oriented x 3 with fluent speech NEURO: no focal motor/sensory deficits  LABORATORY DATA:  I have reviewed the data as listed Lab Results  Component Value Date   WBC 6.9 07/14/2020   HGB 12.2 (L) 07/14/2020   HCT 36.2 (L) 07/14/2020   MCV 88.9 07/14/2020  PLT 165 07/14/2020     Chemistry      Component Value Date/Time   NA 134 (L) 07/14/2020 0451   K 3.6 07/14/2020 0451   CL 98 07/14/2020 0451   CO2 26 07/14/2020 0451   BUN 14 07/14/2020 0451   CREATININE 1.04 07/14/2020 0451   CREATININE 1.04 06/30/2020 0858    CREATININE 1.11 11/03/2017 1533      Component Value Date/Time   CALCIUM 8.6 (L) 07/14/2020 0451   ALKPHOS 82 07/13/2020 1824   AST 14 (L) 07/13/2020 1824   AST 11 (L) 06/30/2020 0858   ALT 13 07/13/2020 1824   ALT 10 06/30/2020 0858   BILITOT 0.9 07/13/2020 1824   BILITOT 0.5 06/30/2020 0858       RADIOGRAPHIC STUDIES: I have personally reviewed the radiological images as listed and agreed with the findings in the report. CT ABDOMEN PELVIS WO CONTRAST  Result Date: 07/13/2020 CLINICAL DATA:  Lower abdominal pain for several days EXAM: CT ABDOMEN AND PELVIS WITHOUT CONTRAST TECHNIQUE: Multidetector CT imaging of the abdomen and pelvis was performed following the standard protocol without IV contrast. COMPARISON:  07/06/2020 FINDINGS: Lower chest: Lung bases are free of acute infiltrate or sizable effusion. Previously seen pulmonary nodules are not within the field of view. Hepatobiliary: Liver is within normal limits. Gallbladder is well distended without wall thickening or pericholecystic fluid. Pancreas: Fullness in the region of the pancreatic head is again seen and stable. Focal hypodensity is noted. Pancreatic duct is dilated but stable in appearance. Spleen: Normal in size without focal abnormality. Adrenals/Urinary Tract: Adrenal glands are within normal limits. Kidneys are well visualized bilaterally. No renal calculi or obstructive changes are seen. Bladder is decompressed. Stomach/Bowel: New rectal wall thickening and perirectal inflammatory changes are noted. These inflammatory changes extend superiorly into the sigmoid and descending colon. Transverse colon and ascending colon appear within normal limits. The appendix is within normal limits. No small bowel or gastric abnormality is noted. Vascular/Lymphatic: Atherosclerotic calcifications are noted. No sizable lymphadenopathy is noted. Reproductive: Prostate is unremarkable. Other: No abdominal wall hernia or abnormality. No  abdominopelvic ascites. Musculoskeletal: No acute or significant osseous findings. Degenerative changes are noted. IMPRESSION: New colonic wall thickening and pericolonic inflammatory changes involving the distal colon from the mid descending portion to the level of the anus. These changes are most consistent with focal colitis. No abscess or perforation is noted. Stable changes in the region of the head of the pancreas similar to that seen on prior exam. Electronically Signed   By: Inez Catalina M.D.   On: 07/13/2020 18:45   CT ABDOMEN PELVIS WO CONTRAST  Result Date: 07/06/2020 CLINICAL DATA:  Nonlocalized acute abdominal pain. Mid abdominal pain since yesterday. History of pancreatic cancer in May ongoing chemo EXAM: CT ABDOMEN AND PELVIS WITHOUT CONTRAST TECHNIQUE: Multidetector CT imaging of the abdomen and pelvis was performed following the standard protocol without IV contrast. COMPARISON:  CT angio chest 09/11/2019, CT chest 06/18/2020, CT abdomen 06/14/2020, MRI abdomen 06/15/2020 FINDINGS: Lower chest: Redemonstration of a 6 mm in 5 mm left lower lobe pulmonary nodule (2:1). Redemonstration of a 4 mm right middle lobe pulmonary nodule (2:6). Coronary artery calcifications. Hepatobiliary: No focal liver abnormality. No CT findings of calcified gallstone. No gallbladder wall thickening or pericholecystic fluid. No biliary dilatation. Pancreas: Redemonstration of 1.4 x 1 cm pancreatic head hypodensity with associated distal main pancreatic duct dilatation measuring up to at least 0.9 cm. Otherwise normal pancreatic contour. No surrounding inflammatory changes. No  main pancreatic ductal dilatation. Spleen: Normal in size without focal abnormality. Adrenals/Urinary Tract: No adrenal nodule bilaterally. No nephrolithiasis, no hydronephrosis, and no contour-deforming renal mass. No ureterolithiasis or hydroureter. The urinary bladder is unremarkable. Stomach/Bowel: Stomach is within normal limits. No evidence  of bowel wall thickening or dilatation. Appendix appears normal. Vascular/Lymphatic: No abdominal aorta or iliac aneurysm. Mild atherosclerotic plaque of the aorta and its branches. No abdominal, pelvic, or inguinal lymphadenopathy. Reproductive: Prostate is unremarkable. Other: No intraperitoneal free fluid. No intraperitoneal free gas. No organized fluid collection. Musculoskeletal: No abdominal wall hernia or abnormality. No suspicious lytic or blastic osseous lesions. No acute displaced fracture. Multilevel degenerative changes of the spine. IMPRESSION: 1. Please note limited evaluation in a patient with known pancreatic cancer. Pulmonary nodules and pancreatic mass with associated main pancreatic duct dilatation appear grossly stable. 2. Otherwise no acute intra-abdominal or intrapelvic abnormality. Electronically Signed   By: Iven Finn M.D.   On: 07/06/2020 22:38   CT CHEST WO CONTRAST  Result Date: 06/18/2020 CLINICAL DATA:  65 year old male with history of pancreatic cancer. Staging examination. EXAM: CT CHEST WITHOUT CONTRAST TECHNIQUE: Multidetector CT imaging of the chest was performed following the standard protocol without IV contrast. COMPARISON:  Chest CT 09/11/2019. FINDINGS: Cardiovascular: Heart size is normal. There is no significant pericardial fluid, thickening or pericardial calcification. There is aortic atherosclerosis, as well as atherosclerosis of the great vessels of the mediastinum and the coronary arteries, including calcified atherosclerotic plaque in the left main, left anterior descending and right coronary arteries. Mediastinum/Nodes: No pathologically enlarged mediastinal or hilar lymph nodes. Please note that accurate exclusion of hilar adenopathy is limited on noncontrast CT scans. Esophagus is unremarkable in appearance. No axillary lymphadenopathy. Lungs/Pleura: Multiple new pulmonary nodules are noted in the lungs bilaterally, largest of which are in the left lower  lobe (axial image 90 of series 4) measuring up to 6 mm. No acute consolidative airspace disease. No pleural effusions. Upper Abdomen: Soft tissue stranding around the pancreas concerning for acute pancreatitis. Previously demonstrated pancreatic mass is not well appreciated on today's noncontrast CT examination (please see recent abdominal MRI 06/15/2020 for full description of these findings). High attenuation material in the gallbladder likely represents vicarious excretion of gadolinium from recent contrast enhanced abdominal MRI. Aortic atherosclerosis. Musculoskeletal: Flowing anterior osteophytes throughout the thoracic spine, suggesting diffuse idiopathic skeletal hyperostosis (DISH). There are no aggressive appearing lytic or blastic lesions noted in the visualized portions of the skeleton. IMPRESSION: 1. Multiple new pulmonary nodules scattered throughout the lungs bilaterally measuring up to 6 mm in the left lower lobe. These are nonspecific, but the possibility of metastatic disease is not excluded. Close attention on future follow-up studies is recommended to ensure the stability or regression of these findings. 2. Aortic atherosclerosis, in addition to left main and 2 vessel coronary artery disease. Please note that although the presence of coronary artery calcium documents the presence of coronary artery disease, the severity of this disease and any potential stenosis cannot be assessed on this non-gated CT examination. Assessment for potential risk factor modification, dietary therapy or pharmacologic therapy may be warranted, if clinically indicated. Aortic Atherosclerosis (ICD10-I70.0). Electronically Signed   By: Vinnie Langton M.D.   On: 06/18/2020 08:44   CT ABDOMEN PELVIS W CONTRAST  Result Date: 06/14/2020 CLINICAL DATA:  Diffuse abdominal pain. Bilateral flank pain for 3 weeks. EXAM: CT ABDOMEN AND PELVIS WITH CONTRAST TECHNIQUE: Multidetector CT imaging of the abdomen and pelvis was  performed using the standard protocol  following bolus administration of intravenous contrast. CONTRAST:  128m OMNIPAQUE IOHEXOL 300 MG/ML  SOLN COMPARISON:  11/22/2019 FINDINGS: Lower chest: The lung bases are clear. The heart size is normal. Hepatobiliary: The liver is normal. Normal gallbladder.There is no biliary ductal dilation. Pancreas: There are inflammatory changes about the pancreas. There is pancreatic ductal dilatation with a possible abrupt cutoff in the distal pancreatic body. There is fullness of the pancreatic head with a heterogeneous appearance and areas of decreased attenuation. Spleen: Unremarkable. Adrenals/Urinary Tract: --Adrenal glands: Unremarkable. --Right kidney/ureter: No hydronephrosis or radiopaque kidney stones. --Left kidney/ureter: No hydronephrosis or radiopaque kidney stones. --Urinary bladder: Unremarkable. Stomach/Bowel: --Stomach/Duodenum: No hiatal hernia or other gastric abnormality. Normal duodenal course and caliber. --Small bowel: Unremarkable. --Colon: Unremarkable. --Appendix: Normal. Vascular/Lymphatic: Atherosclerotic calcification is present within the non-aneurysmal abdominal aorta, without hemodynamically significant stenosis. --No retroperitoneal lymphadenopathy. --No mesenteric lymphadenopathy. --No pelvic or inguinal lymphadenopathy. Reproductive: Unremarkable Other: No ascites or free air. The abdominal wall is normal. Musculoskeletal. No acute displaced fractures. IMPRESSION: 1. Abnormal appearance of the pancreas with adjacent fat stranding, pancreatic ductal dilatation, and fullness of the pancreatic head and distal pancreatic body. Findings may be secondary to acute pancreatitis. However, the fullness of the pancreatic head and pancreatic ductal dilatation is suspicious for an underlying obstructing lesion. As such, follow-up with a nonemergent contrast enhanced MRI is recommended for further evaluation of this finding. 2. No other potentially acute  abnormality detected in the abdomen or pelvis. Electronically Signed   By: CConstance HolsterM.D.   On: 06/14/2020 20:19   MR ABDOMEN MRCP W WO CONTAST  Result Date: 06/15/2020 CLINICAL DATA:  Abnormal pancreas on prior CT, possible acute pancreatitis versus mass EXAM: MRI ABDOMEN WITHOUT AND WITH CONTRAST (INCLUDING MRCP) TECHNIQUE: Multiplanar multisequence MR imaging of the abdomen was performed both before and after the administration of intravenous contrast. Heavily T2-weighted images of the biliary and pancreatic ducts were obtained, and three-dimensional MRCP images were rendered by post processing. CONTRAST:  8.573mGADAVIST GADOBUTROL 1 MMOL/ML IV SOLN COMPARISON:  CT abdomen pelvis, 06/14/2020, 11/22/2019 FINDINGS: Lower chest: No acute findings. Hepatobiliary: There is a rim enhancing lesion of the right lobe of the liver abutting the gallbladder fossa, hepatic segment V, measuring 1.2 x 1.1 cm (series 2, image 42). No biliary ductal dilatation. Pancreas: Diffuse inflammatory fat stranding about the pancreas. There is a hypoenhancing lesion in the superior pancreatic head and neck measuring approximately 3.2 x 2.4 cm (series 20, image 48). Distal to this lesion, there is gross dilatation of the pancreatic duct, measuring up to 9 mm in caliber. The duct is not visualized through the pancreatic head, but is reconstituted near the ampulla. The central superior mesenteric vein and portal vein are abruptly effaced or occluded within the pancreatic head. Spleen:  Within normal limits in size and appearance. Adrenals/Urinary Tract: No masses identified. No evidence of hydronephrosis. Stomach/Bowel: Visualized portions within the abdomen are unremarkable. Vascular/Lymphatic: No pathologically enlarged lymph nodes identified. No abdominal aortic aneurysm demonstrated. Other:  None. Musculoskeletal: No suspicious bone lesions identified. IMPRESSION: 1. There is a hypoenhancing lesion in the superior pancreatic  head and neck measuring approximately 3.2 x 2.4 cm. 2. Distal to this lesion, there is gross dilatation of the pancreatic duct, measuring up to 9 mm in caliber. The duct is not visualized through the pancreatic head, but is reconstituted near the ampulla. 3. The central superior mesenteric vein and portal vein are abruptly effaced or occluded within the pancreatic head. 4. Constellation of findings  is highly suspicious for pancreatic adenocarcinoma. 5. There is a rim enhancing lesion of the right lobe of the liver abutting the gallbladder fossa, hepatic segment V, measuring 1.2 x 1.1 cm. Although difficult to definitively characterize due to small size, there are no clearly benign contrast enhancement characteristics and this is highly suspicious for metastatic disease. 6. Diffuse inflammatory fat stranding about the pancreas, consistent with acute pancreatitis. Findings within the pancreatic head head and neck could reflect phlegmonous change related to pancreatitis, however fullness of the pancreatic head neck has substantially increased when compared to more remote prior dated 11/22/2019 and adenocarcinoma is the diagnosis of exclusion. Electronically Signed   By: Eddie Candle M.D.   On: 06/15/2020 20:58   IR IMAGING GUIDED PORT INSERTION  Result Date: 06/22/2020 INDICATION: 65 year old gentleman pancreatic malignancy presents to interventional radiology for chest port placement EXAM: IMPLANTED PORT A CATH PLACEMENT WITH ULTRASOUND AND FLUOROSCOPIC GUIDANCE MEDICATIONS: None ANESTHESIA/SEDATION: Moderate (conscious) sedation was employed during this procedure. A total of Versed 1.5 mg and Fentanyl 50 mcg was administered intravenously. Moderate Sedation Time: 13 minutes. The patient's level of consciousness and vital signs were monitored continuously by radiology nursing throughout the procedure under my direct supervision. FLUOROSCOPY TIME:  0.4 minutes (4 mGy) COMPLICATIONS: None immediate. PROCEDURE: The  procedure, risks, benefits, and alternatives were explained to the patient. Questions regarding the procedure were encouraged and answered. The patient understands and consents to the procedure. A timeout was performed prior to the initiation of the procedure. Patient positioned supine on the angiography table. Right neck and anterior upper chest prepped and draped in the usual sterile fashion. All elements of maximal sterile barrier were utilized including, cap, mask, sterile gown, sterile gloves, large sterile drape, hand scrubbing and 2% Chlorhexidine for skin cleaning. The right internal jugular vein was evaluated with ultrasound and shown to be patent. A permanent ultrasound image was obtained and placed in the patient's medical record. Local anesthesia was provided with 1% lidocaine with epinephrine. Using sterile gel and a sterile probe cover, the right internal jugular vein was entered with a 21 ga needle during real time ultrasound guidance. 0.018 inch guidewire placed and 21 ga needle exchanged for transitional dilator set. Utilizing fluoroscopy, 0.035 inch guidewire advanced through the needle without difficulty. Attention then turned to the right anterior upper chest. Following local lidocaine administration, a port pocket was created. The catheter was connected to the port and brought from the pocket to the venotomy site through a subcutaneous tunnel. The catheter was cut to size and inserted through the peel-away sheath. The catheter tip was positioned at the cavoatrial junction using fluoroscopic guidance. The port aspirated and flushed well. The port pocket was closed with deep and superficial absorbable suture. The port pocket incision and venotomy sites were also sealed with Dermabond. IMPRESSION: Successful placement of a right internal jugular approach power injectable Port-A-Cath. The catheter is ready for immediate use. Electronically Signed   By: Miachel Roux M.D.   On: 06/22/2020 16:00     All questions were answered. The patient knows to call the clinic with any problems, questions or concerns.     [name], MD 07/14/2020 8:13 AM

## 2020-07-14 NOTE — Progress Notes (Signed)
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  Patient received last chemotherapy on May 17.  In the ED CT scan showed findings consistent with focal colitis involving distal colon.  No abscess or perforation.  Patient was started on ceftriaxone, Zofran, Dilaudid.    Subjective   Patient seen and examined, continues to have diarrhea.   Assessment/Plan:     Colitis -CT abdomen showed focal colitis involving distal colon; no abscess or perforation noted -IV hydration with normal saline -Stool for C. difficile PCR is negative -GI pathogen panel is negative -?  chemotherapy related  -Continue clear liquid diet --Continue ceftriaxone, Flagyl for now    Stage IV pancreatic adenocarcinoma -Followed by oncology, started on chemotherapy recently -Continue pancreatic enzymes 3 times daily with meals    Diabetes mellitus type 2 -Continue Lantus 20 subcu nightly -Sliding scale insulin NovoLog -CBG well controlled -Patient started on clear liquid diet    Hypothyroidism -Continue Synthroid   Hyponatremia -Mild; resolved   Hypertension -Blood pressure is mildly elevated -Start hydralazine 25 mg p.o. every 6 hours as needed for BP greater than 160/100   Abnormal EKG -EKG shows nonspecific ST changes -Patient denies chest pain -Troponin 9 -Outpatient follow-up with cardiology       Scheduled medications:   . enoxaparin (LOVENOX) injection  40 mg Subcutaneous Q24H  . insulin aspart  0-5 Units Subcutaneous QHS  . insulin aspart  0-9 Units Subcutaneous TID WC  . insulin glargine  20 Units Subcutaneous QHS  .  levothyroxine  137 mcg Oral QAC breakfast  . lipase/protease/amylase  72,000 Units Oral TID WC  . pantoprazole  40 mg Oral Daily         Data Reviewed:   CBG:  Recent Labs  Lab 07/13/20 2233 07/14/20 0806 07/14/20 1128  GLUCAP 170* 164* 162*    SpO2: 99 %    Vitals:   07/14/20 0500 07/14/20 0700 07/14/20 0835 07/14/20 1127  BP: (!) 148/89 (!) 156/95 (!) 166/96 (!) 164/95  Pulse: 80 80 92 79  Resp: 17  17 16   Temp:   98.3 F (36.8 C) 98.4 F (36.9 C)  TempSrc:   Oral   SpO2: 95% 97% 94% 99%  Weight:   79.8 kg   Height:   5\' 9"  (1.753 m)      Intake/Output Summary (Last 24 hours) at 07/14/2020 1519 Last data filed at 07/14/2020 1425 Gross per 24 hour  Intake 1800.37 ml  Output --  Net 1800.37 ml    No intake/output data recorded.  Filed Weights   07/13/20 1803 07/14/20 0835  Weight: 81.6 kg 79.8 kg    CBC:  Recent Labs  Lab 07/13/20 1824 07/14/20 0451  WBC 9.9 6.9  HGB 16.1 12.2*  HCT 46.7 36.2*  PLT 226 165  MCV 88.1 88.9  MCH 30.4 30.0  MCHC 34.5 33.7  RDW 12.3 12.2  LYMPHSABS 0.7  --   MONOABS 0.5  --   EOSABS 0.0  --   BASOSABS 0.0  --     Complete metabolic panel:  Recent Labs  Lab 07/13/20 1824 07/13/20 2024 07/14/20 0451  NA 131*  --  134*  K 3.9  --  3.6  CL 95*  --  98  CO2 21*  --  26  GLUCOSE 208*  --  150*  BUN 16  --  14  CREATININE 1.22  --  1.04  CALCIUM 9.4  --  8.6*  AST 14*  --   --   ALT 13  --   --   ALKPHOS 82  --   --   BILITOT 0.9  --   --   ALBUMIN 4.1  --   --   LATICACIDVEN 2.4* 1.6  --     Recent Labs  Lab 07/13/20 1824  LIPASE 21    Recent Labs  Lab 07/13/20 2312  SARSCOV2NAA NEGATIVE    ------------------------------------------------------------------------------------------------------------------ No results for input(s): CHOL, HDL, LDLCALC, TRIG, CHOLHDL, LDLDIRECT in the last 72 hours.  Lab Results  Component Value Date   HGBA1C 7.7 (H) 06/12/2020    ------------------------------------------------------------------------------------------------------------------ No results for input(s): TSH, T4TOTAL, T3FREE, THYROIDAB in the last 72 hours.  Invalid input(s): FREET3 ------------------------------------------------------------------------------------------------------------------ No results for input(s): VITAMINB12, FOLATE, FERRITIN, TIBC, IRON, RETICCTPCT in the last 72 hours.  Coagulation profile No results for input(s): INR, PROTIME in the last 168 hours. No results for input(s): DDIMER in the last 72 hours.  Cardiac Enzymes No results for input(s): CKTOTAL, CKMB, CKMBINDEX, TROPONINI in the last 168 hours.  ------------------------------------------------------------------------------------------------------------------    Component Value Date/Time   BNP 6.9 05/07/2017 0808     Antibiotics: Anti-infectives (From admission, onward)   Start     Dose/Rate Route Frequency Ordered Stop   07/14/20 2100  cefTRIAXone (ROCEPHIN) 2 g in sodium chloride 0.9 % 100 mL IVPB        2 g 200 mL/hr over 30 Minutes Intravenous Every 24 hours 07/13/20 2228     07/13/20 2230  metroNIDAZOLE (FLAGYL) IVPB 500 mg        500 mg 100 mL/hr over 60 Minutes Intravenous Every 8 hours 07/13/20 2228     07/13/20 2145  cefTRIAXone (ROCEPHIN) 2 g in sodium chloride 0.9 % 100 mL IVPB        2 g 200 mL/hr over 30 Minutes Intravenous  Once 07/13/20 2132 07/13/20 2253       Radiology Reports  CT ABDOMEN PELVIS WO CONTRAST  Result Date: 07/13/2020 CLINICAL DATA:  Lower abdominal pain for several days EXAM: CT ABDOMEN AND PELVIS WITHOUT CONTRAST TECHNIQUE: Multidetector CT imaging of the abdomen and pelvis was performed following the standard protocol without IV contrast. COMPARISON:  07/06/2020 FINDINGS: Lower chest: Lung bases are free of acute infiltrate or sizable effusion. Previously seen pulmonary nodules are not within the field of view.  Hepatobiliary: Liver is within normal limits. Gallbladder is well distended without wall thickening or pericholecystic fluid. Pancreas: Fullness in the region of the pancreatic head is again seen and stable. Focal hypodensity is noted. Pancreatic duct is dilated but stable in appearance. Spleen: Normal in size without focal abnormality. Adrenals/Urinary Tract: Adrenal glands are within normal limits. Kidneys are well visualized bilaterally. No renal calculi or obstructive changes are seen. Bladder is decompressed. Stomach/Bowel: New rectal wall thickening and perirectal inflammatory changes are noted. These inflammatory changes extend superiorly into the sigmoid and descending colon. Transverse colon and ascending colon appear within normal limits. The appendix is within normal limits. No small bowel or gastric abnormality is noted. Vascular/Lymphatic: Atherosclerotic calcifications are noted. No sizable lymphadenopathy is noted. Reproductive: Prostate is unremarkable. Other: No abdominal wall hernia or abnormality. No abdominopelvic ascites. Musculoskeletal: No acute or significant osseous findings. Degenerative  changes are noted. IMPRESSION: New colonic wall thickening and pericolonic inflammatory changes involving the distal colon from the mid descending portion to the level of the anus. These changes are most consistent with focal colitis. No abscess or perforation is noted. Stable changes in the region of the head of the pancreas similar to that seen on prior exam. Electronically Signed   By: Inez Catalina M.D.   On: 07/13/2020 18:45      DVT prophylaxis: Lovenox  Code Status: DNR  Family Communication: No family at bedside   Consultants:    Procedures:      Objective    Physical Examination:    General: Appears in no acute distress  Cardiovascular: S1-S2, regular, no murmur auscultated  Respiratory: Clear to auscultation bilaterally  Abdomen: Abdomen is soft, nontender, no  organomegaly  Extremities: No edema in the lower extremities  Neurologic: Alert, oriented x3, no focal deficit noted   Status is: Inpatient  Dispo: The patient is from: Home              Anticipated d/c is to: Home              Anticipated d/c date is: 07/17/2020              Patient currently not stable for discharge  Barrier to discharge-ongoing diarrhea  COVID-19 Labs  No results for input(s): DDIMER, FERRITIN, LDH, CRP in the last 72 hours.  Lab Results  Component Value Date   SARSCOV2NAA NEGATIVE 07/13/2020   SARSCOV2NAA NEGATIVE 06/15/2020   SARSCOV2NAA RESULT: NEGATIVE 01/22/2020    Microbiology  Recent Results (from the past 240 hour(s))  SARS CORONAVIRUS 2 (TAT 6-24 HRS)     Status: None   Collection Time: 07/13/20 11:12 PM  Result Value Ref Range Status   SARS Coronavirus 2 NEGATIVE NEGATIVE Final    Comment: (NOTE) SARS-CoV-2 target nucleic acids are NOT DETECTED.  The SARS-CoV-2 RNA is generally detectable in upper and lower respiratory specimens during the acute phase of infection. Negative results do not preclude SARS-CoV-2 infection, do not rule out co-infections with other pathogens, and should not be used as the sole basis for treatment or other patient management decisions. Negative results must be combined with clinical observations, patient history, and epidemiological information. The expected result is Negative.  Fact Sheet for Patients: SugarRoll.be  Fact Sheet for Healthcare Providers: https://www.woods-mathews.com/  This test is not yet approved or cleared by the [place] FDA and  has been authorized for detection and/or diagnosis of SARS-CoV-2 by FDA under an Emergency Use Authorization (EUA). This EUA will remain  in effect (meaning this test can be used) for the duration of the COVID-19 declaration under Se ction 564(b)(1) of the Act, 21 U.S.C. section 360bbb-3(b)(1), unless the  authorization is terminated or revoked sooner.  Performed at [facility], [place] [address], [place], [place] [address]   Gastrointestinal Panel by PCR , Stool     Status: None   Collection Time: 07/14/20  3:05 AM   Specimen: Stool  Result Value Ref Range Status   Campylobacter species NOT DETECTED NOT DETECTED Final   Plesimonas shigelloides NOT DETECTED NOT DETECTED Final   Salmonella species NOT DETECTED NOT DETECTED Final   Yersinia enterocolitica NOT DETECTED NOT DETECTED Final   Vibrio species NOT DETECTED NOT DETECTED Final   Vibrio cholerae NOT DETECTED NOT DETECTED Final   Enteroaggregative E coli (EAEC) NOT DETECTED NOT DETECTED Final   Enteropathogenic E coli (EPEC) NOT  DETECTED NOT DETECTED Final   Enterotoxigenic E coli (ETEC) NOT DETECTED NOT DETECTED Final   Shiga like toxin producing E coli (STEC) NOT DETECTED NOT DETECTED Final   Shigella/Enteroinvasive E coli (EIEC) NOT DETECTED NOT DETECTED Final   Cryptosporidium NOT DETECTED NOT DETECTED Final   Cyclospora cayetanensis NOT DETECTED NOT DETECTED Final   Entamoeba histolytica NOT DETECTED NOT DETECTED Final   Giardia lamblia NOT DETECTED NOT DETECTED Final   Adenovirus F40/41 NOT DETECTED NOT DETECTED Final   Astrovirus NOT DETECTED NOT DETECTED Final   Norovirus GI/GII NOT DETECTED NOT DETECTED Final   Rotavirus A NOT DETECTED NOT DETECTED Final   Sapovirus (I, II, IV, and V) NOT DETECTED NOT DETECTED Final    Comment: Performed at [facility], [address] [place], [place] [address]  C Difficile Quick Screen w PCR reflex     Status: None   Collection Time: 07/14/20  3:05 AM   Specimen: Stool  Result Value Ref Range Status   C Diff antigen NEGATIVE NEGATIVE Final   C Diff toxin NEGATIVE NEGATIVE Final   C Diff interpretation No C. difficile detected.  Final    Comment: Performed at [facility], [place] [address] [place], [place] [address]        [facility] If 7PM-7AM, please contact night-coverage at [email or web address], Office  [phone]   07/14/2020, 3:19 PM  LOS: 1 day

## 2020-07-15 ENCOUNTER — Ambulatory Visit: Payer: Self-pay

## 2020-07-15 ENCOUNTER — Encounter: Payer: Self-pay | Admitting: Family Medicine

## 2020-07-15 DIAGNOSIS — C259 Malignant neoplasm of pancreas, unspecified: Secondary | ICD-10-CM | POA: Diagnosis not present

## 2020-07-15 DIAGNOSIS — I10 Essential (primary) hypertension: Secondary | ICD-10-CM | POA: Diagnosis not present

## 2020-07-15 DIAGNOSIS — K529 Noninfective gastroenteritis and colitis, unspecified: Secondary | ICD-10-CM | POA: Diagnosis not present

## 2020-07-15 LAB — CBC
HCT: 33.4 % — ABNORMAL LOW (ref 39.0–52.0)
Hemoglobin: 11.5 g/dL — ABNORMAL LOW (ref 13.0–17.0)
MCH: 30.4 pg (ref 26.0–34.0)
MCHC: 34.4 g/dL (ref 30.0–36.0)
MCV: 88.4 fL (ref 80.0–100.0)
Platelets: 149 10*3/uL — ABNORMAL LOW (ref 150–400)
RBC: 3.78 MIL/uL — ABNORMAL LOW (ref 4.22–5.81)
RDW: 12.4 % (ref 11.5–15.5)
WBC: 4.1 10*3/uL (ref 4.0–10.5)
nRBC: 0 % (ref 0.0–0.2)

## 2020-07-15 LAB — GLUCOSE, CAPILLARY
Glucose-Capillary: 134 mg/dL — ABNORMAL HIGH (ref 70–99)
Glucose-Capillary: 97 mg/dL (ref 70–99)
Glucose-Capillary: 136 mg/dL — ABNORMAL HIGH (ref 70–99)
Glucose-Capillary: 127 mg/dL — ABNORMAL HIGH (ref 70–99)

## 2020-07-15 LAB — COMPREHENSIVE METABOLIC PANEL
ALT: 9 U/L (ref 0–44)
AST: 10 U/L — ABNORMAL LOW (ref 15–41)
Albumin: 2.8 g/dL — ABNORMAL LOW (ref 3.5–5.0)
Alkaline Phosphatase: 53 U/L (ref 38–126)
Anion gap: 9 (ref 5–15)
BUN: 11 mg/dL (ref 8–23)
CO2: 24 mmol/L (ref 22–32)
Calcium: 8.6 mg/dL — ABNORMAL LOW (ref 8.9–10.3)
Chloride: 103 mmol/L (ref 98–111)
Creatinine, Ser: 0.93 mg/dL (ref 0.61–1.24)
GFR, Estimated: >60 mL/min (ref >60)
Glucose, Bld: 111 mg/dL — ABNORMAL HIGH (ref 70–99)
Potassium: 3.5 mmol/L (ref 3.5–5.1)
Sodium: 136 mmol/L (ref 135–145)
Total Bilirubin: 0.2 mg/dL — ABNORMAL LOW (ref 0.3–1.2)
Total Protein: 5.2 g/dL — ABNORMAL LOW (ref 6.5–8.1)

## 2020-07-15 MED ORDER — MORPHINE SULFATE ER 15 MG PO TBCR
15.0000 mg | EXTENDED_RELEASE_TABLET | Freq: Three times a day (TID) | ORAL | Status: DC
  Administered 2020-07-15 – 2020-07-17 (×7): 15 mg via ORAL
  Filled 2020-07-15 (×7): qty 1

## 2020-07-15 MED ORDER — MORPHINE SULFATE ER 15 MG PO TBCR: 15.0000 mg | EXTENDED_RELEASE_TABLET | Freq: Two times a day (BID) | ORAL | Status: DC

## 2020-07-15 MED ORDER — PANCRELIPASE (LIP-PROT-AMYL) 12000-38000 UNITS PO CPEP: 72000.0000 [IU] | ORAL_CAPSULE | Freq: Three times a day (TID) | ORAL | Status: DC

## 2020-07-15 MED ORDER — ONDANSETRON HCL 4 MG PO TABS: 8.0000 mg | ORAL_TABLET | Freq: Two times a day (BID) | ORAL | Status: DC | PRN

## 2020-07-15 MED ORDER — INSULIN GLARGINE 100 UNIT/ML ~~LOC~~ SOLN
20.0000 [IU] | Freq: Every day | SUBCUTANEOUS | Status: DC
  Filled 2020-07-15 (×2): qty 0.2

## 2020-07-15 MED ORDER — HYDROMORPHONE HCL 1 MG/ML IJ SOLN
2.0000 mg | INTRAMUSCULAR | Status: DC | PRN
  Administered 2020-07-15 – 2020-07-16 (×5): 2 mg via INTRAVENOUS
  Filled 2020-07-15 (×5): qty 2

## 2020-07-15 MED ORDER — INSULIN ASPART 100 UNIT/ML IJ SOLN
4.0000 [IU] | Freq: Three times a day (TID) | INTRAMUSCULAR | Status: DC
  Administered 2020-07-16 (×3): 4 [IU] via SUBCUTANEOUS

## 2020-07-15 MED ORDER — PSYLLIUM 95 % PO PACK
1.0000 | PACK | Freq: Every day | ORAL | Status: DC
  Administered 2020-07-15 – 2020-07-16 (×2): 1 via ORAL
  Filled 2020-07-15 (×2): qty 1

## 2020-07-15 NOTE — Progress Notes (Addendum)
[facility] PROGRESS NOTE    Progress Note  Timothy Page  TFT:[id number] DOB: August 10, 1955 DOA: 07/13/2020 PCP: [name], MD     Brief Narrative:   Timothy Page is an 65 y.o. 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  CT scan showed focal colitis of the distal colon with no abscess or perforation.   Antibiotics: Rocephin and Flagyl Microbiology data: Blood culture: Negative till date If PCR is negative. Gastrointestinal panel is negative Procedures: None  Assessment/Plan:   Infectious Colitis C. difficile PCR was negative. He was started on Rocephin and Flagyl. Started on clear liquid diet which he is tolerating. CT scan showed no abscess or perforation.  Stage IV pancreatic adenocarcinoma: Continue pancreatic enzymes follow with oncology as an outpatient.  Diabetes mellitus type 2: On long-acting insulin plus sliding scale blood glucose fairly controlled.  Hypothyroidism: Continue Synthroid.  Hypovolemic hyponatremia: Mild resolved with IV fluid hydration.  Essential hypertension: Blood pressure continues to be elevated question if this is due to pain resume home regimen.  DVT prophylaxis: SCD Family Communication:none Status is: Inpatient  Remains inpatient appropriate because:Hemodynamically unstable   Dispo: The patient is from: Home              Anticipated d/c is to: Home              Patient currently is not medically stable to d/c.   Difficult to place patient No        Code Status:     Code Status Orders  (From admission, onward)         Start     Ordered   07/13/20 2253  Do not attempt resuscitation (DNR)  Continuous       Question Answer Comment  In the event of cardiac or respiratory ARREST Do not call  a "code blue"   In the event of cardiac or respiratory ARREST Do not perform Intubation, CPR, defibrillation or ACLS   In the event of cardiac or respiratory ARREST Use medication by any route, position, wound care, and other measures to relive pain and suffering. May use oxygen, suction and manual treatment of airway obstruction as needed for comfort.      07/13/20 2252        Code Status History    Date Active Date Inactive Code Status Order ID Comments User Context   07/13/2020 2228 07/13/2020 2252 Full Code [id number]  Shela Leff, MD ED   06/15/2020 0319 06/22/2020 1646 Full Code [id number]  Quintella Baton, MD Inpatient   05/11/2017 0346 05/13/2017 1500 Full Code [id number]  Vianne Bulls, MD ED   05/07/2017 1456 05/09/2017 1330 Full Code [id number]  Velna Hatchet, MD Inpatient   07/31/2016 1646 08/05/2016 1944 Full Code [id number]  [facility]   Advance Care Planning Activity        IV Access:    Peripheral IV   Procedures and diagnostic studies:   CT ABDOMEN PELVIS WO CONTRAST  Result Date: 07/13/2020 CLINICAL DATA:  Lower abdominal pain for several days EXAM: CT ABDOMEN AND PELVIS WITHOUT CONTRAST TECHNIQUE: Multidetector CT imaging of the abdomen and pelvis was performed following the standard protocol without IV contrast. COMPARISON:  07/06/2020 FINDINGS: Lower chest: Lung bases are free of acute infiltrate or sizable effusion. Previously seen pulmonary nodules  are not within the field of view. Hepatobiliary: Liver is within normal limits. Gallbladder is well distended without wall thickening or pericholecystic fluid. Pancreas: Fullness in the region of the pancreatic head is again seen and stable. Focal hypodensity is noted. Pancreatic duct is dilated but stable in appearance. Spleen: Normal in size without focal abnormality. Adrenals/Urinary Tract: Adrenal glands are within normal limits. Kidneys are well visualized bilaterally. No renal calculi or obstructive  changes are seen. Bladder is decompressed. Stomach/Bowel: New rectal wall thickening and perirectal inflammatory changes are noted. These inflammatory changes extend superiorly into the sigmoid and descending colon. Transverse colon and ascending colon appear within normal limits. The appendix is within normal limits. No small bowel or gastric abnormality is noted. Vascular/Lymphatic: Atherosclerotic calcifications are noted. No sizable lymphadenopathy is noted. Reproductive: Prostate is unremarkable. Other: No abdominal wall hernia or abnormality. No abdominopelvic ascites. Musculoskeletal: No acute or significant osseous findings. Degenerative changes are noted. IMPRESSION: New colonic wall thickening and pericolonic inflammatory changes involving the distal colon from the mid descending portion to the level of the anus. These changes are most consistent with focal colitis. No abscess or perforation is noted. Stable changes in the region of the head of the pancreas similar to that seen on prior exam. Electronically Signed   By: Inez Catalina M.D.   On: 07/13/2020 18:45     Medical Consultants:    None.   Subjective:    Timothy Page small episode of bright red blood per rectum 1 time this morning.  Objective:    Vitals:   07/14/20 1613 07/14/20 2147 07/15/20 0036 07/15/20 0501  BP: (!) 159/91 (!) 145/91 (!) 159/95 (!) 150/81  Pulse: 76 77 73 71  Resp: 16 (!) 22 20 16   Temp: 98.2 F (36.8 C) 98.8 F (37.1 C) 98.2 F (36.8 C) 98 F (36.7 C)  TempSrc:  Oral Oral Oral  SpO2: 98% 100% 100% 100%  Weight:    80.6 kg  Height:       SpO2: 100 %   Intake/Output Summary (Last 24 hours) at 07/15/2020 0912 Last data filed at 07/15/2020 0800 Gross per 24 hour  Intake 2290.71 ml  Output --  Net 2290.71 ml   Filed Weights   07/13/20 1803 07/14/20 0835 07/15/20 0501  Weight: 81.6 kg 79.8 kg 80.6 kg    Exam: General exam: In no acute distress. Respiratory system: Good air movement and  clear to auscultation. Cardiovascular system: S1 & S2 heard, RRR. No JVD. Gastrointestinal system: Abdomen is nondistended, soft and nontender.  Extremities: No pedal edema. Skin: No rashes, lesions or ulcers Psychiatry: Judgement and insight appear normal. Mood & affect appropriate.    Data Reviewed:    Labs: Basic Metabolic Panel: Recent Labs  Lab 07/13/20 1824 07/14/20 0451 07/15/20 0327  NA 131* 134* 136  K 3.9 3.6 3.5  CL 95* 98 103  CO2 21* 26 24  GLUCOSE 208* 150* 111*  BUN 16 14 11   CREATININE 1.22 1.04 0.93  CALCIUM 9.4 8.6* 8.6*   GFR Estimated Creatinine Clearance: 81.3 mL/min (by C-G formula based on SCr of 0.93 mg/dL). Liver Function Tests: Recent Labs  Lab 07/13/20 1824 07/15/20 0327  AST 14* 10*  ALT 13 9  ALKPHOS 82 53  BILITOT 0.9 0.2*  PROT 7.6 5.2*  ALBUMIN 4.1 2.8*   Recent Labs  Lab 07/13/20 1824  LIPASE 21   No results for input(s): AMMONIA in the last 168 hours. Coagulation profile No results for  input(s): INR, PROTIME in the last 168 hours. COVID-19 Labs  No results for input(s): DDIMER, FERRITIN, LDH, CRP in the last 72 hours.  Lab Results  Component Value Date   SARSCOV2NAA NEGATIVE 07/13/2020   SARSCOV2NAA NEGATIVE 06/15/2020   SARSCOV2NAA RESULT: NEGATIVE 01/22/2020    CBC: Recent Labs  Lab 07/13/20 1824 07/14/20 0451 07/15/20 0327  WBC 9.9 6.9 4.1  NEUTROABS 8.6*  --   --   HGB 16.1 12.2* 11.5*  HCT 46.7 36.2* 33.4*  MCV 88.1 88.9 88.4  PLT 226 165 149*   Cardiac Enzymes: No results for input(s): CKTOTAL, CKMB, CKMBINDEX, TROPONINI in the last 168 hours. BNP (last 3 results) No results for input(s): PROBNP in the last 8760 hours. CBG: Recent Labs  Lab 07/14/20 0806 07/14/20 1128 07/14/20 1613 07/14/20 2148 07/15/20 0550  GLUCAP 164* 162* 176* 145* 134*   D-Dimer: No results for input(s): DDIMER in the last 72 hours. Hgb A1c: No results for input(s): HGBA1C in the last 72 hours. Lipid Profile: No  results for input(s): CHOL, HDL, LDLCALC, TRIG, CHOLHDL, LDLDIRECT in the last 72 hours. Thyroid function studies: No results for input(s): TSH, T4TOTAL, T3FREE, THYROIDAB in the last 72 hours.  Invalid input(s): FREET3 Anemia work up: No results for input(s): VITAMINB12, FOLATE, FERRITIN, TIBC, IRON, RETICCTPCT in the last 72 hours. Sepsis Labs: Recent Labs  Lab 07/13/20 1824 07/13/20 2024 07/14/20 0451 07/15/20 0327  WBC 9.9  --  6.9 4.1  LATICACIDVEN 2.4* 1.6  --   --    Microbiology Recent Results (from the past 240 hour(s))  SARS CORONAVIRUS 2 (TAT 6-24 HRS)     Status: None   Collection Time: 07/13/20 11:12 PM  Result Value Ref Range Status   SARS Coronavirus 2 NEGATIVE NEGATIVE Final    Comment: (NOTE) SARS-CoV-2 target nucleic acids are NOT DETECTED.  The SARS-CoV-2 RNA is generally detectable in upper and lower respiratory specimens during the acute phase of infection. Negative results do not preclude SARS-CoV-2 infection, do not rule out co-infections with other pathogens, and should not be used as the sole basis for treatment or other patient management decisions. Negative results must be combined with clinical observations, patient history, and epidemiological information. The expected result is Negative.  Fact Sheet for Patients: SugarRoll.be  Fact Sheet for Healthcare Providers: https://www.woods-mathews.com/  This test is not yet approved or cleared by the [place] FDA and  has been authorized for detection and/or diagnosis of SARS-CoV-2 by FDA under an Emergency Use Authorization (EUA). This EUA will remain  in effect (meaning this test can be used) for the duration of the COVID-19 declaration under Se ction 564(b)(1) of the Act, 21 U.S.C. section 360bbb-3(b)(1), unless the authorization is terminated or revoked sooner.  Performed at [facility], [place] [address] [place], [place] [address]    Culture, blood (routine x 2)     Status: None (Preliminary result)   Collection Time: 07/14/20 12:05 AM   Specimen: BLOOD LEFT ARM  Result Value Ref Range Status   Specimen Description BLOOD LEFT ARM  Final   Special Requests   Final    BOTTLES DRAWN AEROBIC AND ANAEROBIC Blood Culture results may not be optimal due to an inadequate volume of blood received in culture bottles   Culture   Final    NO GROWTH 1 DAY Performed at [facility], [place] [address], [place], [place] [address]    Report Status PENDING  Incomplete  Culture, blood (routine x  2)     Status: None (Preliminary result)   Collection Time: 07/14/20 12:14 AM   Specimen: BLOOD  Result Value Ref Range Status   Specimen Description BLOOD LEFT ANTECUBITAL  Final   Special Requests   Final    BOTTLES DRAWN AEROBIC AND ANAEROBIC Blood Culture adequate volume   Culture   Final    NO GROWTH 1 DAY Performed at [facility], [place] [address], [place], [place] [address]    Report Status PENDING  Incomplete  Gastrointestinal Panel by PCR , Stool     Status: None   Collection Time: 07/14/20  3:05 AM   Specimen: Stool  Result Value Ref Range Status   Campylobacter species NOT DETECTED NOT DETECTED Final   Plesimonas shigelloides NOT DETECTED NOT DETECTED Final   Salmonella species NOT DETECTED NOT DETECTED Final   Yersinia enterocolitica NOT DETECTED NOT DETECTED Final   Vibrio species NOT DETECTED NOT DETECTED Final   Vibrio cholerae NOT DETECTED NOT DETECTED Final   Enteroaggregative E coli (EAEC) NOT DETECTED NOT DETECTED Final   Enteropathogenic E coli (EPEC) NOT DETECTED NOT DETECTED Final   Enterotoxigenic E coli (ETEC) NOT DETECTED NOT DETECTED Final   Shiga like toxin producing E coli (STEC) NOT DETECTED NOT DETECTED Final   Shigella/Enteroinvasive E coli (EIEC) NOT DETECTED NOT DETECTED Final   Cryptosporidium NOT DETECTED NOT DETECTED Final   Cyclospora cayetanensis NOT DETECTED NOT DETECTED Final    Entamoeba histolytica NOT DETECTED NOT DETECTED Final   Giardia lamblia NOT DETECTED NOT DETECTED Final   Adenovirus F40/41 NOT DETECTED NOT DETECTED Final   Astrovirus NOT DETECTED NOT DETECTED Final   Norovirus GI/GII NOT DETECTED NOT DETECTED Final   Rotavirus A NOT DETECTED NOT DETECTED Final   Sapovirus (I, II, IV, and V) NOT DETECTED NOT DETECTED Final    Comment: Performed at [facility], [place] [address] [place], [place] [address]  C Difficile Quick Screen w PCR reflex     Status: None   Collection Time: 07/14/20  3:05 AM   Specimen: Stool  Result Value Ref Range Status   C Diff antigen NEGATIVE NEGATIVE Final   C Diff toxin NEGATIVE NEGATIVE Final   C Diff interpretation No C. difficile detected.  Final    Comment: Performed at [facility], [place] [address] [place], [place] [address]     Medications:   . enoxaparin (LOVENOX) injection  40 mg Subcutaneous Q24H  . insulin aspart  0-5 Units Subcutaneous QHS  . insulin aspart  0-9 Units Subcutaneous TID WC  . insulin glargine  20 Units Subcutaneous QHS  . levothyroxine  137 mcg Oral QAC breakfast  . lipase/protease/amylase  72,000 Units Oral TID WC  . pantoprazole  40 mg Oral Daily   Continuous Infusions: . cefTRIAXone (ROCEPHIN)  IV 2 g (07/14/20 2026)  . metronidazole Stopped (07/15/20 0656)      LOS: 2 days   Charlynne Cousins  [facility]  07/15/2020, 9:12 AM

## 2020-07-16 DIAGNOSIS — I10 Essential (primary) hypertension: Secondary | ICD-10-CM | POA: Diagnosis not present

## 2020-07-16 DIAGNOSIS — K529 Noninfective gastroenteritis and colitis, unspecified: Secondary | ICD-10-CM | POA: Diagnosis not present

## 2020-07-16 DIAGNOSIS — C259 Malignant neoplasm of pancreas, unspecified: Secondary | ICD-10-CM | POA: Diagnosis not present

## 2020-07-16 LAB — GLUCOSE, CAPILLARY
Glucose-Capillary: 120 mg/dL — ABNORMAL HIGH (ref 70–99)
Glucose-Capillary: 159 mg/dL — ABNORMAL HIGH (ref 70–99)
Glucose-Capillary: 168 mg/dL — ABNORMAL HIGH (ref 70–99)
Glucose-Capillary: 121 mg/dL — ABNORMAL HIGH (ref 70–99)

## 2020-07-16 MED ORDER — METRONIDAZOLE 500 MG PO TABS
500.0000 mg | ORAL_TABLET | Freq: Three times a day (TID) | ORAL | Status: DC
  Administered 2020-07-16 – 2020-07-17 (×3): 500 mg via ORAL
  Filled 2020-07-16 (×3): qty 1

## 2020-07-16 MED ORDER — OXYCODONE HCL 5 MG PO TABS
5.0000 mg | ORAL_TABLET | ORAL | Status: DC | PRN
  Administered 2020-07-16: 5 mg via ORAL
  Filled 2020-07-16: qty 1

## 2020-07-16 MED ORDER — PANCRELIPASE (LIP-PROT-AMYL) 12000-38000 UNITS PO CPEP
36000.0000 [IU] | ORAL_CAPSULE | Freq: Three times a day (TID) | ORAL | Status: DC
  Administered 2020-07-16 (×2): 36000 [IU] via ORAL
  Filled 2020-07-16 (×2): qty 3

## 2020-07-16 MED ORDER — HYDROMORPHONE HCL 1 MG/ML IJ SOLN
0.5000 mg | INTRAMUSCULAR | Status: DC | PRN
Start: 2020-07-16 — End: 2020-07-17
  Administered 2020-07-16: 0.5 mg via INTRAVENOUS
  Filled 2020-07-16: qty 0.5

## 2020-07-16 MED ORDER — INSULIN GLARGINE 100 UNIT/ML ~~LOC~~ SOLN
20.0000 [IU] | SUBCUTANEOUS | Status: DC
  Administered 2020-07-16: 20 [IU] via SUBCUTANEOUS
  Filled 2020-07-16 (×2): qty 0.2

## 2020-07-16 NOTE — Progress Notes (Signed)
[facility] PROGRESS NOTE    Progress Note  Timothy Page  YWV:[id number] DOB: 21-Jun-1955 DOA: 07/13/2020 PCP: [name], MD     Brief Narrative:   Timothy Page is an 65 y.o. 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  CT scan showed focal colitis of the distal colon with no abscess or perforation.   Antibiotics: Rocephin and Flagyl Microbiology data: Blood culture: Negative till date If PCR is negative. Gastrointestinal panel is negative Procedures: None  Assessment/Plan:   Colitis: Of unclear etiology question likely due to chemotherapy C. difficile PCR was negative. He was started on Rocephin and Flagyl. Started on clear liquid diet which he is tolerating. CT scan showed no abscess or perforation.  Abdominal pain secondary to stage IV pancreatic adenocarcinoma: Lowered the dose of pancreatic enzymes. We will started on his home regimen of MS Contin and oxycodone if pain control can be discharged tomorrow.  Keep IV Dilaudid as needed for breakthrough.  Diabetes mellitus type 2: On long-acting insulin plus sliding scale blood glucose fairly controlled.  Hypothyroidism: Continue Synthroid.  Hypovolemic hyponatremia: Mild resolved with IV fluid hydration.  Essential hypertension: Blood pressure is improved today his pain is controlled.  DVT prophylaxis: SCD Family Communication:none Status is: Inpatient  Remains inpatient appropriate because:Hemodynamically unstable   Dispo: The patient is from: Home              Anticipated d/c is to: Home              Patient currently is not medically stable to d/c.   Difficult to place patient No        Code Status:     Code Status Orders  (From admission, onward)         Start      Ordered   07/13/20 2253  Do not attempt resuscitation (DNR)  Continuous       Question Answer Comment  In the event of cardiac or respiratory ARREST Do not call a "code blue"   In the event of cardiac or respiratory ARREST Do not perform Intubation, CPR, defibrillation or ACLS   In the event of cardiac or respiratory ARREST Use medication by any route, position, wound care, and other measures to relive pain and suffering. May use oxygen, suction and manual treatment of airway obstruction as needed for comfort.      07/13/20 2252        Code Status History    Date Active Date Inactive Code Status Order ID Comments User Context   07/13/2020 2228 07/13/2020 2252 Full Code [id number]  Shela Leff, MD ED   06/15/2020 0319 06/22/2020 1646 Full Code [id number]  Quintella Baton, MD Inpatient   05/11/2017 0346 05/13/2017 1500 Full Code [id number]  Vianne Bulls, MD ED   05/07/2017 1456 05/09/2017 1330 Full Code [id number]  Velna Hatchet, MD Inpatient   07/31/2016 1646 08/05/2016 1944 Full Code [id number]  [facility]   Advance Care Planning Activity        IV Access:    Peripheral IV   Procedures and diagnostic studies:   No results found.   Medical Consultants:    None.   Subjective:    Timothy Page small episode of bright red blood per rectum 1 time this morning.  Objective:  Vitals:   07/15/20 0501 07/15/20 1130 07/15/20 1948 07/16/20 0452  BP: (!) 150/81 (!) 150/87 134/90 (!) 141/93  Pulse: 71 71 73 72  Resp: 16 16 14 16   Temp: 98 F (36.7 C) 98.2 F (36.8 C) 98.3 F (36.8 C) 98.4 F (36.9 C)  TempSrc: Oral Oral Oral Oral  SpO2: 100% 96% 96% 99%  Weight: 80.6 kg   80.7 kg  Height:       SpO2: 99 %   Intake/Output Summary (Last 24 hours) at 07/16/2020 0849 Last data filed at 07/15/2020 1200 Gross per 24 hour  Intake 120 ml  Output --  Net 120 ml   Filed Weights   07/14/20 0835 07/15/20 0501 07/16/20 0452  Weight: 79.8 kg 80.6 kg 80.7 kg     Exam: General exam: In no acute distress. Respiratory system: Good air movement and clear to auscultation. Cardiovascular system: S1 & S2 heard, RRR. No JVD. Gastrointestinal system: Abdomen is nondistended, soft and nontender.  Extremities: No pedal edema. Skin: No rashes, lesions or ulcers Psychiatry: Judgement and insight appear normal. Mood & affect appropriate.   Data Reviewed:    Labs: Basic Metabolic Panel: Recent Labs  Lab 07/13/20 1824 07/14/20 0451 07/15/20 0327  NA 131* 134* 136  K 3.9 3.6 3.5  CL 95* 98 103  CO2 21* 26 24  GLUCOSE 208* 150* 111*  BUN 16 14 11   CREATININE 1.22 1.04 0.93  CALCIUM 9.4 8.6* 8.6*   GFR Estimated Creatinine Clearance: 81.3 mL/min (by C-G formula based on SCr of 0.93 mg/dL). Liver Function Tests: Recent Labs  Lab 07/13/20 1824 07/15/20 0327  AST 14* 10*  ALT 13 9  ALKPHOS 82 53  BILITOT 0.9 0.2*  PROT 7.6 5.2*  ALBUMIN 4.1 2.8*   Recent Labs  Lab 07/13/20 1824  LIPASE 21   No results for input(s): AMMONIA in the last 168 hours. Coagulation profile No results for input(s): INR, PROTIME in the last 168 hours. COVID-19 Labs  No results for input(s): DDIMER, FERRITIN, LDH, CRP in the last 72 hours.  Lab Results  Component Value Date   SARSCOV2NAA NEGATIVE 07/13/2020   SARSCOV2NAA NEGATIVE 06/15/2020   SARSCOV2NAA RESULT: NEGATIVE 01/22/2020    CBC: Recent Labs  Lab 07/13/20 1824 07/14/20 0451 07/15/20 0327  WBC 9.9 6.9 4.1  NEUTROABS 8.6*  --   --   HGB 16.1 12.2* 11.5*  HCT 46.7 36.2* 33.4*  MCV 88.1 88.9 88.4  PLT 226 165 149*   Cardiac Enzymes: No results for input(s): CKTOTAL, CKMB, CKMBINDEX, TROPONINI in the last 168 hours. BNP (last 3 results) No results for input(s): PROBNP in the last 8760 hours. CBG: Recent Labs  Lab 07/15/20 0550 07/15/20 1118 07/15/20 1546 07/15/20 2056 07/16/20 0601  GLUCAP 134* 97 136* 127* 120*   D-Dimer: No results for input(s): DDIMER in the last 72  hours. Hgb A1c: No results for input(s): HGBA1C in the last 72 hours. Lipid Profile: No results for input(s): CHOL, HDL, LDLCALC, TRIG, CHOLHDL, LDLDIRECT in the last 72 hours. Thyroid function studies: No results for input(s): TSH, T4TOTAL, T3FREE, THYROIDAB in the last 72 hours.  Invalid input(s): FREET3 Anemia work up: No results for input(s): VITAMINB12, FOLATE, FERRITIN, TIBC, IRON, RETICCTPCT in the last 72 hours. Sepsis Labs: Recent Labs  Lab 07/13/20 1824 07/13/20 2024 07/14/20 0451 07/15/20 0327  WBC 9.9  --  6.9 4.1  LATICACIDVEN 2.4* 1.6  --   --    Microbiology Recent Results (from the past  240 hour(s))  SARS CORONAVIRUS 2 (TAT 6-24 HRS)     Status: None   Collection Time: 07/13/20 11:12 PM  Result Value Ref Range Status   SARS Coronavirus 2 NEGATIVE NEGATIVE Final    Comment: (NOTE) SARS-CoV-2 target nucleic acids are NOT DETECTED.  The SARS-CoV-2 RNA is generally detectable in upper and lower respiratory specimens during the acute phase of infection. Negative results do not preclude SARS-CoV-2 infection, do not rule out co-infections with other pathogens, and should not be used as the sole basis for treatment or other patient management decisions. Negative results must be combined with clinical observations, patient history, and epidemiological information. The expected result is Negative.  Fact Sheet for Patients: SugarRoll.be  Fact Sheet for Healthcare Providers: https://www.woods-mathews.com/  This test is not yet approved or cleared by the [place] FDA and  has been authorized for detection and/or diagnosis of SARS-CoV-2 by FDA under an Emergency Use Authorization (EUA). This EUA will remain  in effect (meaning this test can be used) for the duration of the COVID-19 declaration under Se ction 564(b)(1) of the Act, 21 U.S.C. section 360bbb-3(b)(1), unless the authorization is terminated or revoked  sooner.  Performed at [facility], [place] [address], [place], [place] [address]   Culture, blood (routine x 2)     Status: None (Preliminary result)   Collection Time: 07/14/20 12:05 AM   Specimen: BLOOD LEFT ARM  Result Value Ref Range Status   Specimen Description BLOOD LEFT ARM  Final   Special Requests   Final    BOTTLES DRAWN AEROBIC AND ANAEROBIC Blood Culture results may not be optimal due to an inadequate volume of blood received in culture bottles   Culture   Final    NO GROWTH 2 DAYS Performed at [facility], [place] [address] [place], [place] [address]    Report Status PENDING  Incomplete  Culture, blood (routine x 2)     Status: None (Preliminary result)   Collection Time: 07/14/20 12:14 AM   Specimen: BLOOD  Result Value Ref Range Status   Specimen Description BLOOD LEFT ANTECUBITAL  Final   Special Requests   Final    BOTTLES DRAWN AEROBIC AND ANAEROBIC Blood Culture adequate volume   Culture   Final    NO GROWTH 2 DAYS Performed at [facility], [place] [address], [place], [place] [address]    Report Status PENDING  Incomplete  Gastrointestinal Panel by PCR , Stool     Status: None   Collection Time: 07/14/20  3:05 AM   Specimen: Stool  Result Value Ref Range Status   Campylobacter species NOT DETECTED NOT DETECTED Final   Plesimonas shigelloides NOT DETECTED NOT DETECTED Final   Salmonella species NOT DETECTED NOT DETECTED Final   Yersinia enterocolitica NOT DETECTED NOT DETECTED Final   Vibrio species NOT DETECTED NOT DETECTED Final   Vibrio cholerae NOT DETECTED NOT DETECTED Final   Enteroaggregative E coli (EAEC) NOT DETECTED NOT DETECTED Final   Enteropathogenic E coli (EPEC) NOT DETECTED NOT DETECTED Final   Enterotoxigenic E coli (ETEC) NOT DETECTED NOT DETECTED Final   Shiga like toxin producing E coli (STEC) NOT DETECTED NOT DETECTED Final   Shigella/Enteroinvasive E coli (EIEC) NOT DETECTED NOT DETECTED Final   Cryptosporidium NOT  DETECTED NOT DETECTED Final   Cyclospora cayetanensis NOT DETECTED NOT DETECTED Final   Entamoeba histolytica NOT DETECTED NOT DETECTED Final   Giardia lamblia NOT DETECTED NOT DETECTED Final  Adenovirus F40/41 NOT DETECTED NOT DETECTED Final   Astrovirus NOT DETECTED NOT DETECTED Final   Norovirus GI/GII NOT DETECTED NOT DETECTED Final   Rotavirus A NOT DETECTED NOT DETECTED Final   Sapovirus (I, II, IV, and V) NOT DETECTED NOT DETECTED Final    Comment: Performed at [facility], [place] [place], [place] [address]  C Difficile Quick Screen w PCR reflex     Status: None   Collection Time: 07/14/20  3:05 AM   Specimen: Stool  Result Value Ref Range Status   C Diff antigen NEGATIVE NEGATIVE Final   C Diff toxin NEGATIVE NEGATIVE Final   C Diff interpretation No C. difficile detected.  Final    Comment: Performed at [facility], [place] [address] [place], [place] [address]     Medications:   . enoxaparin (LOVENOX) injection  40 mg Subcutaneous Q24H  . insulin aspart  0-5 Units Subcutaneous QHS  . insulin aspart  0-9 Units Subcutaneous TID WC  . insulin aspart  4 Units Subcutaneous TID WC  . insulin glargine  20 Units Subcutaneous Daily  . levothyroxine  137 mcg Oral QAC breakfast  . [START ON 07/18/2020] lipase/protease/amylase  72,000 Units Oral TID WC  . morphine  15 mg Oral TID  . pantoprazole  40 mg Oral Daily  . psyllium  1 packet Oral Daily   Continuous Infusions: . cefTRIAXone (ROCEPHIN)  IV 2 g (07/15/20 2017)  . metronidazole 500 mg (07/16/20 8882)      LOS: 3 days   Charlynne Cousins  [facility]  07/16/2020, 8:49 AM

## 2020-07-16 NOTE — Plan of Care (Signed)
?  Problem: Clinical Measurements: ?Goal: Will remain free from infection ?Outcome: Progressing ?  ?

## 2020-07-16 NOTE — Plan of Care (Signed)
  Problem: Clinical Measurements: Goal: Will remain free from infection 07/16/2020 2107 by Ethel Rana, RN Outcome: Progressing 07/16/2020 1500 by Ethel Rana, RN Outcome: Progressing

## 2020-07-17 ENCOUNTER — Telehealth: Payer: Self-pay | Admitting: Hematology and Oncology

## 2020-07-17 ENCOUNTER — Other Ambulatory Visit: Payer: Self-pay | Admitting: Hematology and Oncology

## 2020-07-17 DIAGNOSIS — K529 Noninfective gastroenteritis and colitis, unspecified: Secondary | ICD-10-CM | POA: Diagnosis not present

## 2020-07-17 LAB — GLUCOSE, CAPILLARY: Glucose-Capillary: 130 mg/dL — ABNORMAL HIGH (ref 70–99)

## 2020-07-17 NOTE — Telephone Encounter (Signed)
Scheduled per 06/02 staff message, patient has been called and voicemail was left.

## 2020-07-17 NOTE — Plan of Care (Signed)
  Problem: Clinical Measurements: Goal: Ability to maintain clinical measurements within normal limits will improve Outcome: Progressing   Problem: Clinical Measurements: Goal: Will remain free from infection 07/17/2020 0758 by Ethel Rana, RN Outcome: Progressing 07/16/2020 2107 by Ethel Rana, RN Outcome: Progressing

## 2020-07-17 NOTE — Discharge Summary (Signed)
Physician Discharge Summary  Timothy Page UDJ:[id number] DOB: 11-28-1955 DOA: 07/13/2020  PCP: [name], MD  Admit date: 07/13/2020 Discharge date: 07/17/2020  Admitted From: Home Disposition:  Home  Recommendations for Outpatient Follow-up:  1. Follow up with PCP in 1-2 weeks 2. Please obtain BMP/CBC in one week 3.   Home Health:no Equipment/Devices:none  Discharge Condition:Stable CODE STATUS:full Diet recommendation: Heart Healthy   Brief/Interim Summary: 65 y.o. 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  CT scan showed focal colitis of the distal colon with no abscess or perforation.  Discharge Diagnoses:  Principal Problem:   Colitis Active Problems:   Essential hypertension   Adenocarcinoma of pancreas, stage 4 (HCC)   Diabetes ([place])   Hyponatremia  Colitis, Of unclear etiology question due to chemotherapy. C. difficile PCR was negative start empirically on Rocephin and Flagyl in hospital received 5 days of antibiotics these were stopped he remained afebrile abdominal pain improved. CT scan of the abdomen was done that showed no abscess or perforation just mild colitis. He started to tolerate his clear liquid diet which was advanced he was discharged in stable condition.  Abdominal pain secondary to stage IV pancreatic adenocarcinoma: We will continue his regimen of MS Contin and oxycodone as an outpatient.  Diabetes mellitus type 2: No change made to his medication, it is to note that he will need less insulin and less oral hypoglycemic agents as he continues to lose weight due to his adenocarcinoma.  Hypothyroidism: Continue Synthroid.  Hypovolemic hyponatremia: Resolved with IV fluid hydration.  Central hypertension: Blood pressure is  controlled today.  No changes to his medication.  Discharge Instructions  Discharge Instructions    Diet - low sodium heart healthy   Complete by: As directed    Increase activity slowly   Complete by: As directed      Allergies as of 07/17/2020      Reactions   Atenolol    Severe "slowing"of functioning Severe "slowing"of functioning   Hydroxyzine Other (See Comments)   "felt like I was coming out of my skin"   Tetracycline    Rash Because of a history of documented adverse serious drug reaction;Medi Alert bracelet  is recommended Rash Because of a history of documented adverse serious drug reaction;Medi Alert braceletis recommended   Atorvastatin    D/Ced by him due to Myalgias in Sept 2014 D/Ced by him due to Myalgias in Sept 2014   Codeine    Hallucinations. "I see pink elephants"   Guaifenesin    Other reaction(s): Other (See Comments) unknown unknown   Mucinex [guaifenesin Er] Other (See Comments)   Hallucinations "I see pink elephants"      Medication List    TAKE these medications   FreeStyle Libre 2 Sensor Misc 2 Devices by Does not apply route every 14 (fourteen) days.   Insulin Pen Needle 31G X 5 MM Misc Use with insulin pen 3 times a day   lactulose 10 g packet Commonly known as: CEPHULAC Take 10 g by mouth daily.   Lantus SoloStar 100 UNIT/ML Solostar Pen Generic drug: insulin glargine Give up to 10 units under the skin every evening at bedtime. What changed:   how much to take  how to take this  when to take this  additional instructions   levothyroxine 137 MCG  tablet Commonly known as: SYNTHROID TAKE 1 TABLET BY MOUTH  DAILY BEFORE BREAKFAST   lipase/protease/amylase 36000 UNITS Cpep capsule Commonly known as: Creon Take 2 capsules (72,000 Units total) by mouth 3 (three) times daily with meals. May also take 1 capsule (36,000 Units total) as needed (with snacks).   LORazepam 0.5 MG tablet Commonly known as: Ativan Take 1 tablet  (0.5 mg total) by mouth every 6 (six) hours as needed for anxiety.   morphine 15 MG 12 hr tablet Commonly known as: MS CONTIN Take 1 tablet (15 mg total) by mouth every 12 (twelve) hours. For cancer pain   omeprazole 20 MG capsule Commonly known as: PRILOSEC Take 1 capsule (20 mg total) by mouth daily.   ondansetron 8 MG tablet Commonly known as: Zofran Take 1 tablet (8 mg total) by mouth 2 (two) times daily as needed. Start on day 3 after chemotherapy.   OneTouch Delica Plus [id number] Misc USE TO CHECK BLOOD SUGAR  ONCE DAILY   OneTouch Ultra test strip Generic drug: glucose blood USE TO CHECK BLOOD SUGAR  ONCE DAILY   senna-docusate 8.6-50 MG tablet Commonly known as: Senokot-S Take 1 tablet by mouth 2 (two) times daily.       Allergies  Allergen Reactions  . Atenolol     Severe "slowing"of functioning Severe "slowing"of functioning  . Hydroxyzine Other (See Comments)    "felt like I was coming out of my skin"  . Tetracycline     Rash Because of a history of documented adverse serious drug reaction;Medi Alert bracelet  is recommended Rash Because of a history of documented adverse serious drug reaction;Medi Alert braceletis recommended  . Atorvastatin     D/Ced by him due to Myalgias in Sept 2014 D/Ced by him due to Myalgias in Sept 2014  . Codeine     Hallucinations. "I see pink elephants"  . Guaifenesin     Other reaction(s): Other (See Comments) unknown unknown  . Mucinex [Guaifenesin Er] Other (See Comments)    Hallucinations "I see pink elephants"    Consultations:  None   Procedures/Studies: CT ABDOMEN PELVIS WO CONTRAST  Result Date: 07/13/2020 CLINICAL DATA:  Lower abdominal pain for several days EXAM: CT ABDOMEN AND PELVIS WITHOUT CONTRAST TECHNIQUE: Multidetector CT imaging of the abdomen and pelvis was performed following the standard protocol without IV contrast. COMPARISON:  07/06/2020 FINDINGS: Lower chest: Lung bases are free of acute  infiltrate or sizable effusion. Previously seen pulmonary nodules are not within the field of view. Hepatobiliary: Liver is within normal limits. Gallbladder is well distended without wall thickening or pericholecystic fluid. Pancreas: Fullness in the region of the pancreatic head is again seen and stable. Focal hypodensity is noted. Pancreatic duct is dilated but stable in appearance. Spleen: Normal in size without focal abnormality. Adrenals/Urinary Tract: Adrenal glands are within normal limits. Kidneys are well visualized bilaterally. No renal calculi or obstructive changes are seen. Bladder is decompressed. Stomach/Bowel: New rectal wall thickening and perirectal inflammatory changes are noted. These inflammatory changes extend superiorly into the sigmoid and descending colon. Transverse colon and ascending colon appear within normal limits. The appendix is within normal limits. No small bowel or gastric abnormality is noted. Vascular/Lymphatic: Atherosclerotic calcifications are noted. No sizable lymphadenopathy is noted. Reproductive: Prostate is unremarkable. Other: No abdominal wall hernia or abnormality. No abdominopelvic ascites. Musculoskeletal: No acute or significant osseous findings. Degenerative changes are noted. IMPRESSION: New colonic wall thickening and pericolonic inflammatory changes involving the distal colon from the  mid descending portion to the level of the anus. These changes are most consistent with focal colitis. No abscess or perforation is noted. Stable changes in the region of the head of the pancreas similar to that seen on prior exam. Electronically Signed   By: Inez Catalina M.D.   On: 07/13/2020 18:45   CT ABDOMEN PELVIS WO CONTRAST  Result Date: 07/06/2020 CLINICAL DATA:  Nonlocalized acute abdominal pain. Mid abdominal pain since yesterday. History of pancreatic cancer in May ongoing chemo EXAM: CT ABDOMEN AND PELVIS WITHOUT CONTRAST TECHNIQUE: Multidetector CT imaging of the  abdomen and pelvis was performed following the standard protocol without IV contrast. COMPARISON:  CT angio chest 09/11/2019, CT chest 06/18/2020, CT abdomen 06/14/2020, MRI abdomen 06/15/2020 FINDINGS: Lower chest: Redemonstration of a 6 mm in 5 mm left lower lobe pulmonary nodule (2:1). Redemonstration of a 4 mm right middle lobe pulmonary nodule (2:6). Coronary artery calcifications. Hepatobiliary: No focal liver abnormality. No CT findings of calcified gallstone. No gallbladder wall thickening or pericholecystic fluid. No biliary dilatation. Pancreas: Redemonstration of 1.4 x 1 cm pancreatic head hypodensity with associated distal main pancreatic duct dilatation measuring up to at least 0.9 cm. Otherwise normal pancreatic contour. No surrounding inflammatory changes. No main pancreatic ductal dilatation. Spleen: Normal in size without focal abnormality. Adrenals/Urinary Tract: No adrenal nodule bilaterally. No nephrolithiasis, no hydronephrosis, and no contour-deforming renal mass. No ureterolithiasis or hydroureter. The urinary bladder is unremarkable. Stomach/Bowel: Stomach is within normal limits. No evidence of bowel wall thickening or dilatation. Appendix appears normal. Vascular/Lymphatic: No abdominal aorta or iliac aneurysm. Mild atherosclerotic plaque of the aorta and its branches. No abdominal, pelvic, or inguinal lymphadenopathy. Reproductive: Prostate is unremarkable. Other: No intraperitoneal free fluid. No intraperitoneal free gas. No organized fluid collection. Musculoskeletal: No abdominal wall hernia or abnormality. No suspicious lytic or blastic osseous lesions. No acute displaced fracture. Multilevel degenerative changes of the spine. IMPRESSION: 1. Please note limited evaluation in a patient with known pancreatic cancer. Pulmonary nodules and pancreatic mass with associated main pancreatic duct dilatation appear grossly stable. 2. Otherwise no acute intra-abdominal or intrapelvic abnormality.  Electronically Signed   By: Iven Finn M.D.   On: 07/06/2020 22:38   CT CHEST WO CONTRAST  Result Date: 06/18/2020 CLINICAL DATA:  65 year old male with history of pancreatic cancer. Staging examination. EXAM: CT CHEST WITHOUT CONTRAST TECHNIQUE: Multidetector CT imaging of the chest was performed following the standard protocol without IV contrast. COMPARISON:  Chest CT 09/11/2019. FINDINGS: Cardiovascular: Heart size is normal. There is no significant pericardial fluid, thickening or pericardial calcification. There is aortic atherosclerosis, as well as atherosclerosis of the great vessels of the mediastinum and the coronary arteries, including calcified atherosclerotic plaque in the left main, left anterior descending and right coronary arteries. Mediastinum/Nodes: No pathologically enlarged mediastinal or hilar lymph nodes. Please note that accurate exclusion of hilar adenopathy is limited on noncontrast CT scans. Esophagus is unremarkable in appearance. No axillary lymphadenopathy. Lungs/Pleura: Multiple new pulmonary nodules are noted in the lungs bilaterally, largest of which are in the left lower lobe (axial image 90 of series 4) measuring up to 6 mm. No acute consolidative airspace disease. No pleural effusions. Upper Abdomen: Soft tissue stranding around the pancreas concerning for acute pancreatitis. Previously demonstrated pancreatic mass is not well appreciated on today's noncontrast CT examination (please see recent abdominal MRI 06/15/2020 for full description of these findings). High attenuation material in the gallbladder likely represents vicarious excretion of gadolinium from recent contrast enhanced abdominal MRI.  Aortic atherosclerosis. Musculoskeletal: Flowing anterior osteophytes throughout the thoracic spine, suggesting diffuse idiopathic skeletal hyperostosis (DISH). There are no aggressive appearing lytic or blastic lesions noted in the visualized portions of the skeleton.  IMPRESSION: 1. Multiple new pulmonary nodules scattered throughout the lungs bilaterally measuring up to 6 mm in the left lower lobe. These are nonspecific, but the possibility of metastatic disease is not excluded. Close attention on future follow-up studies is recommended to ensure the stability or regression of these findings. 2. Aortic atherosclerosis, in addition to left main and 2 vessel coronary artery disease. Please note that although the presence of coronary artery calcium documents the presence of coronary artery disease, the severity of this disease and any potential stenosis cannot be assessed on this non-gated CT examination. Assessment for potential risk factor modification, dietary therapy or pharmacologic therapy may be warranted, if clinically indicated. Aortic Atherosclerosis (ICD10-I70.0). Electronically Signed   By: Vinnie Langton M.D.   On: 06/18/2020 08:44   IR IMAGING GUIDED PORT INSERTION  Result Date: 06/22/2020 INDICATION: 65 year old gentleman pancreatic malignancy presents to interventional radiology for chest port placement EXAM: IMPLANTED PORT A CATH PLACEMENT WITH ULTRASOUND AND FLUOROSCOPIC GUIDANCE MEDICATIONS: None ANESTHESIA/SEDATION: Moderate (conscious) sedation was employed during this procedure. A total of Versed 1.5 mg and Fentanyl 50 mcg was administered intravenously. Moderate Sedation Time: 13 minutes. The patient's level of consciousness and vital signs were monitored continuously by radiology nursing throughout the procedure under my direct supervision. FLUOROSCOPY TIME:  0.4 minutes (4 mGy) COMPLICATIONS: None immediate. PROCEDURE: The procedure, risks, benefits, and alternatives were explained to the patient. Questions regarding the procedure were encouraged and answered. The patient understands and consents to the procedure. A timeout was performed prior to the initiation of the procedure. Patient positioned supine on the angiography table. Right neck and  anterior upper chest prepped and draped in the usual sterile fashion. All elements of maximal sterile barrier were utilized including, cap, mask, sterile gown, sterile gloves, large sterile drape, hand scrubbing and 2% Chlorhexidine for skin cleaning. The right internal jugular vein was evaluated with ultrasound and shown to be patent. A permanent ultrasound image was obtained and placed in the patient's medical record. Local anesthesia was provided with 1% lidocaine with epinephrine. Using sterile gel and a sterile probe cover, the right internal jugular vein was entered with a 21 ga needle during real time ultrasound guidance. 0.018 inch guidewire placed and 21 ga needle exchanged for transitional dilator set. Utilizing fluoroscopy, 0.035 inch guidewire advanced through the needle without difficulty. Attention then turned to the right anterior upper chest. Following local lidocaine administration, a port pocket was created. The catheter was connected to the port and brought from the pocket to the venotomy site through a subcutaneous tunnel. The catheter was cut to size and inserted through the peel-away sheath. The catheter tip was positioned at the cavoatrial junction using fluoroscopic guidance. The port aspirated and flushed well. The port pocket was closed with deep and superficial absorbable suture. The port pocket incision and venotomy sites were also sealed with Dermabond. IMPRESSION: Successful placement of a right internal jugular approach power injectable Port-A-Cath. The catheter is ready for immediate use. Electronically Signed   By: Miachel Roux M.D.   On: 06/22/2020 16:00    (Echo, Carotid, EGD, Colonoscopy, ERCP)    Subjective: No complaints  Discharge Exam: Vitals:   07/17/20 0326 07/17/20 0759  BP: (!) 149/90 (!) 161/85  Pulse: 75 67  Resp: 18 18  Temp: 98.7 F (37.1  C) 98.1 F (36.7 C)  SpO2: 97% 100%   Vitals:   07/16/20 1955 07/17/20 0122 07/17/20 0326 07/17/20 0759  BP:  (!) 161/91  (!) 149/90 (!) 161/85  Pulse: 76  75 67  Resp: 18  18 18   Temp: 98.2 F (36.8 C)  98.7 F (37.1 C) 98.1 F (36.7 C)  TempSrc: Oral  Oral Oral  SpO2: 100%  97% 100%  Weight:  80.7 kg    Height:        General: Pt is alert, awake, not in acute distress Cardiovascular: RRR, S1/S2 +, no rubs, no gallops Respiratory: CTA bilaterally, no wheezing, no rhonchi Abdominal: Soft, NT, ND, bowel sounds + Extremities: no edema, no cyanosis    The results of significant diagnostics from this hospitalization (including imaging, microbiology, ancillary and laboratory) are listed below for reference.     Microbiology: Recent Results (from the past 240 hour(s))  SARS CORONAVIRUS 2 (TAT 6-24 HRS)     Status: None   Collection Time: 07/13/20 11:12 PM  Result Value Ref Range Status   SARS Coronavirus 2 NEGATIVE NEGATIVE Final    Comment: (NOTE) SARS-CoV-2 target nucleic acids are NOT DETECTED.  The SARS-CoV-2 RNA is generally detectable in upper and lower respiratory specimens during the acute phase of infection. Negative results do not preclude SARS-CoV-2 infection, do not rule out co-infections with other pathogens, and should not be used as the sole basis for treatment or other patient management decisions. Negative results must be combined with clinical observations, patient history, and epidemiological information. The expected result is Negative.  Fact Sheet for Patients: SugarRoll.be  Fact Sheet for Healthcare Providers: https://www.woods-mathews.com/  This test is not yet approved or cleared by the [place] FDA and  has been authorized for detection and/or diagnosis of SARS-CoV-2 by FDA under an Emergency Use Authorization (EUA). This EUA will remain  in effect (meaning this test can be used) for the duration of the COVID-19 declaration under Se ction 564(b)(1) of the Act, 21 U.S.C. section 360bbb-3(b)(1), unless the  authorization is terminated or revoked sooner.  Performed at [facility], [place] [address], [place], [place] [address]   Culture, blood (routine x 2)     Status: None (Preliminary result)   Collection Time: 07/14/20 12:05 AM   Specimen: BLOOD LEFT ARM  Result Value Ref Range Status   Specimen Description BLOOD LEFT ARM  Final   Special Requests   Final    BOTTLES DRAWN AEROBIC AND ANAEROBIC Blood Culture results may not be optimal due to an inadequate volume of blood received in culture bottles   Culture   Final    NO GROWTH 3 DAYS Performed at [facility], [place] [address], [place], [place] [address]    Report Status PENDING  Incomplete  Culture, blood (routine x 2)     Status: None (Preliminary result)   Collection Time: 07/14/20 12:14 AM   Specimen: BLOOD  Result Value Ref Range Status   Specimen Description BLOOD LEFT ANTECUBITAL  Final   Special Requests   Final    BOTTLES DRAWN AEROBIC AND ANAEROBIC Blood Culture adequate volume   Culture   Final    NO GROWTH 3 DAYS Performed at [facility], [place] [address], [place], [place] [address]    Report Status PENDING  Incomplete  Gastrointestinal Panel by PCR , Stool     Status: None   Collection Time: 07/14/20  3:05 AM   Specimen: Stool  Result Value Ref Range Status   Campylobacter species NOT DETECTED NOT DETECTED Final   Plesimonas shigelloides NOT DETECTED NOT DETECTED Final   Salmonella species NOT DETECTED NOT DETECTED Final   Yersinia enterocolitica NOT DETECTED NOT DETECTED Final   Vibrio species NOT DETECTED NOT DETECTED Final   Vibrio cholerae NOT DETECTED NOT DETECTED Final   Enteroaggregative E coli (EAEC) NOT DETECTED NOT DETECTED Final   Enteropathogenic E coli (EPEC) NOT DETECTED NOT DETECTED Final   Enterotoxigenic E coli (ETEC) NOT DETECTED NOT DETECTED Final   Shiga like toxin producing E coli (STEC) NOT DETECTED NOT DETECTED Final   Shigella/Enteroinvasive E coli (EIEC) NOT DETECTED NOT  DETECTED Final   Cryptosporidium NOT DETECTED NOT DETECTED Final   Cyclospora cayetanensis NOT DETECTED NOT DETECTED Final   Entamoeba histolytica NOT DETECTED NOT DETECTED Final   Giardia lamblia NOT DETECTED NOT DETECTED Final   Adenovirus F40/41 NOT DETECTED NOT DETECTED Final   Astrovirus NOT DETECTED NOT DETECTED Final   Norovirus GI/GII NOT DETECTED NOT DETECTED Final   Rotavirus A NOT DETECTED NOT DETECTED Final   Sapovirus (I, II, IV, and V) NOT DETECTED NOT DETECTED Final    Comment: Performed at [facility], [address] [place], [place] [address]  C Difficile Quick Screen w PCR reflex     Status: None   Collection Time: 07/14/20  3:05 AM   Specimen: Stool  Result Value Ref Range Status   C Diff antigen NEGATIVE NEGATIVE Final   C Diff toxin NEGATIVE NEGATIVE Final   C Diff interpretation No C. difficile detected.  Final    Comment: Performed at [facility], [place] [address], [place], [place] [address]     Labs: BNP (last 3 results) No results for input(s): BNP in the last 8760 hours. Basic Metabolic Panel: Recent Labs  Lab 07/13/20 1824 07/14/20 0451 07/15/20 0327  NA 131* 134* 136  K 3.9 3.6 3.5  CL 95* 98 103  CO2 21* 26 24  GLUCOSE 208* 150* 111*  BUN 16 14 11   CREATININE 1.22 1.04 0.93  CALCIUM 9.4 8.6* 8.6*   Liver Function Tests: Recent Labs  Lab 07/13/20 1824 07/15/20 0327  AST 14* 10*  ALT 13 9  ALKPHOS 82 53  BILITOT 0.9 0.2*  PROT 7.6 5.2*  ALBUMIN 4.1 2.8*   Recent Labs  Lab 07/13/20 1824  LIPASE 21   No results for input(s): AMMONIA in the last 168 hours. CBC: Recent Labs  Lab 07/13/20 1824 07/14/20 0451 07/15/20 0327  WBC 9.9 6.9 4.1  NEUTROABS 8.6*  --   --   HGB 16.1 12.2* 11.5*  HCT 46.7 36.2* 33.4*  MCV 88.1 88.9 88.4  PLT 226 165 149*   Cardiac Enzymes: No results for input(s): CKTOTAL, CKMB, CKMBINDEX, TROPONINI in the last 168 hours. BNP: Invalid input(s): POCBNP CBG: Recent Labs  Lab  07/16/20 0601 07/16/20 1114 07/16/20 1646 07/16/20 2108 07/17/20 0547  GLUCAP 120* 159* 168* 121* 130*   D-Dimer No results for input(s): DDIMER in the last 72 hours. Hgb A1c No results for input(s): HGBA1C in the last 72 hours. Lipid Profile No results for input(s): CHOL, HDL, LDLCALC, TRIG, CHOLHDL, LDLDIRECT in the last 72 hours. Thyroid function studies No results for input(s): TSH, T4TOTAL, T3FREE, THYROIDAB in the last 72 hours.  Invalid input(s): FREET3 Anemia work up No results for input(s): VITAMINB12, FOLATE, FERRITIN, TIBC, IRON, RETICCTPCT in the last 72 hours. Urinalysis    Component Value  Date/Time   COLORURINE AMBER (A) 07/13/2020 1830   APPEARANCEUR CLOUDY (A) 07/13/2020 1830   LABSPEC 1.033 (H) 07/13/2020 1830   PHURINE 5.0 07/13/2020 1830   GLUCOSEU 50 (A) 07/13/2020 1830   GLUCOSEU NEGATIVE 09/13/2016 0946   HGBUR NEGATIVE 07/13/2020 1830   HGBUR negative 05/05/2009 1323   BILIRUBINUR NEGATIVE 07/13/2020 1830   BILIRUBINUR Negative 11/22/2019 1048   KETONESUR 20 (A) 07/13/2020 1830   PROTEINUR 30 (A) 07/13/2020 1830   UROBILINOGEN 0.2 11/22/2019 1048   UROBILINOGEN 0.2 09/13/2016 0946   NITRITE NEGATIVE 07/13/2020 1830   LEUKOCYTESUR NEGATIVE 07/13/2020 1830   Sepsis Labs Invalid input(s): PROCALCITONIN,  WBC,  LACTICIDVEN Microbiology Recent Results (from the past 240 hour(s))  SARS CORONAVIRUS 2 (TAT 6-24 HRS)     Status: None   Collection Time: 07/13/20 11:12 PM  Result Value Ref Range Status   SARS Coronavirus 2 NEGATIVE NEGATIVE Final    Comment: (NOTE) SARS-CoV-2 target nucleic acids are NOT DETECTED.  The SARS-CoV-2 RNA is generally detectable in upper and lower respiratory specimens during the acute phase of infection. Negative results do not preclude SARS-CoV-2 infection, do not rule out co-infections with other pathogens, and should not be used as the sole basis for treatment or other patient management decisions. Negative results  must be combined with clinical observations, patient history, and epidemiological information. The expected result is Negative.  Fact Sheet for Patients: SugarRoll.be  Fact Sheet for Healthcare Providers: https://www.woods-mathews.com/  This test is not yet approved or cleared by the [place] FDA and  has been authorized for detection and/or diagnosis of SARS-CoV-2 by FDA under an Emergency Use Authorization (EUA). This EUA will remain  in effect (meaning this test can be used) for the duration of the COVID-19 declaration under Se ction 564(b)(1) of the Act, 21 U.S.C. section 360bbb-3(b)(1), unless the authorization is terminated or revoked sooner.  Performed at [facility], [place] [address], [place], [place] [address]   Culture, blood (routine x 2)     Status: None (Preliminary result)   Collection Time: 07/14/20 12:05 AM   Specimen: BLOOD LEFT ARM  Result Value Ref Range Status   Specimen Description BLOOD LEFT ARM  Final   Special Requests   Final    BOTTLES DRAWN AEROBIC AND ANAEROBIC Blood Culture results may not be optimal due to an inadequate volume of blood received in culture bottles   Culture   Final    NO GROWTH 3 DAYS Performed at [facility], [place] [address], [place], [place] [address]    Report Status PENDING  Incomplete  Culture, blood (routine x 2)     Status: None (Preliminary result)   Collection Time: 07/14/20 12:14 AM   Specimen: BLOOD  Result Value Ref Range Status   Specimen Description BLOOD LEFT ANTECUBITAL  Final   Special Requests   Final    BOTTLES DRAWN AEROBIC AND ANAEROBIC Blood Culture adequate volume   Culture   Final    NO GROWTH 3 DAYS Performed at [facility], [place] [address] [place], [place] [address]    Report Status PENDING  Incomplete  Gastrointestinal Panel by PCR , Stool     Status: None   Collection Time: 07/14/20  3:05 AM   Specimen: Stool  Result Value Ref Range  Status   Campylobacter species NOT DETECTED NOT DETECTED Final   Plesimonas shigelloides NOT DETECTED NOT DETECTED Final   Salmonella species NOT DETECTED NOT DETECTED Final  Yersinia enterocolitica NOT DETECTED NOT DETECTED Final   Vibrio species NOT DETECTED NOT DETECTED Final   Vibrio cholerae NOT DETECTED NOT DETECTED Final   Enteroaggregative E coli (EAEC) NOT DETECTED NOT DETECTED Final   Enteropathogenic E coli (EPEC) NOT DETECTED NOT DETECTED Final   Enterotoxigenic E coli (ETEC) NOT DETECTED NOT DETECTED Final   Shiga like toxin producing E coli (STEC) NOT DETECTED NOT DETECTED Final   Shigella/Enteroinvasive E coli (EIEC) NOT DETECTED NOT DETECTED Final   Cryptosporidium NOT DETECTED NOT DETECTED Final   Cyclospora cayetanensis NOT DETECTED NOT DETECTED Final   Entamoeba histolytica NOT DETECTED NOT DETECTED Final   Giardia lamblia NOT DETECTED NOT DETECTED Final   Adenovirus F40/41 NOT DETECTED NOT DETECTED Final   Astrovirus NOT DETECTED NOT DETECTED Final   Norovirus GI/GII NOT DETECTED NOT DETECTED Final   Rotavirus A NOT DETECTED NOT DETECTED Final   Sapovirus (I, II, IV, and V) NOT DETECTED NOT DETECTED Final    Comment: Performed at [facility], [place] [address] [place], [place] [address]  C Difficile Quick Screen w PCR reflex     Status: None   Collection Time: 07/14/20  3:05 AM   Specimen: Stool  Result Value Ref Range Status   C Diff antigen NEGATIVE NEGATIVE Final   C Diff toxin NEGATIVE NEGATIVE Final   C Diff interpretation No C. difficile detected.  Final    Comment: Performed at [facility], [place] [address] [place], [place] [address]     Time coordinating discharge: Over 30 minutes  SIGNED:   Charlynne Cousins, MD  [facility] 07/17/2020, 9:08 AM Pager   If 7PM-7AM, please contact night-coverage [email or web address] Password [email or web address]

## 2020-07-17 NOTE — Progress Notes (Addendum)
For Cycle 2 Irinotecan will be omitted because of severe colitis requiring hospitalization after Cycle 1  Birgit Nowling

## 2020-07-17 NOTE — Plan of Care (Signed)

## 2020-07-17 NOTE — Progress Notes (Signed)
D/C instructions given and reviewed. Wife at bedside to transport.

## 2020-07-19 LAB — CULTURE, BLOOD (ROUTINE X 2)
Culture: NO GROWTH
Culture: NO GROWTH
Special Requests: ADEQUATE

## 2020-07-21 ENCOUNTER — Other Ambulatory Visit: Payer: Self-pay

## 2020-07-21 ENCOUNTER — Encounter: Payer: Self-pay | Admitting: Hematology and Oncology

## 2020-07-21 ENCOUNTER — Telehealth: Payer: Self-pay | Admitting: Hematology and Oncology

## 2020-07-21 NOTE — Telephone Encounter (Signed)
Scheduled per los, called patient regarding upcoming appointments. Left a voicemail.

## 2020-07-22 ENCOUNTER — Other Ambulatory Visit: Payer: Self-pay

## 2020-07-22 ENCOUNTER — Telehealth: Payer: Self-pay | Admitting: Hematology and Oncology

## 2020-07-22 ENCOUNTER — Inpatient Hospital Stay (HOSPITAL_BASED_OUTPATIENT_CLINIC_OR_DEPARTMENT_OTHER): Payer: 59 | Admitting: Hematology and Oncology

## 2020-07-22 ENCOUNTER — Encounter: Payer: Self-pay | Admitting: Hematology and Oncology

## 2020-07-22 ENCOUNTER — Inpatient Hospital Stay: Payer: 59 | Attending: Genetic Counselor

## 2020-07-22 DIAGNOSIS — G893 Neoplasm related pain (acute) (chronic): Secondary | ICD-10-CM

## 2020-07-22 DIAGNOSIS — G4700 Insomnia, unspecified: Secondary | ICD-10-CM | POA: Insufficient documentation

## 2020-07-22 DIAGNOSIS — I10 Essential (primary) hypertension: Secondary | ICD-10-CM | POA: Insufficient documentation

## 2020-07-22 DIAGNOSIS — Z5111 Encounter for antineoplastic chemotherapy: Secondary | ICD-10-CM | POA: Insufficient documentation

## 2020-07-22 DIAGNOSIS — R634 Abnormal weight loss: Secondary | ICD-10-CM

## 2020-07-22 DIAGNOSIS — C259 Malignant neoplasm of pancreas, unspecified: Secondary | ICD-10-CM

## 2020-07-22 DIAGNOSIS — Z79899 Other long term (current) drug therapy: Secondary | ICD-10-CM | POA: Diagnosis not present

## 2020-07-22 DIAGNOSIS — Z794 Long term (current) use of insulin: Secondary | ICD-10-CM | POA: Insufficient documentation

## 2020-07-22 DIAGNOSIS — E119 Type 2 diabetes mellitus without complications: Secondary | ICD-10-CM | POA: Insufficient documentation

## 2020-07-22 DIAGNOSIS — C787 Secondary malignant neoplasm of liver and intrahepatic bile duct: Secondary | ICD-10-CM | POA: Insufficient documentation

## 2020-07-22 LAB — CMP (CANCER CENTER ONLY)
ALT: 31 U/L (ref 0–44)
AST: 18 U/L (ref 15–41)
Albumin: 3.5 g/dL (ref 3.5–5.0)
Alkaline Phosphatase: 68 U/L (ref 38–126)
Anion gap: 11 (ref 5–15)
BUN: 13 mg/dL (ref 8–23)
CO2: 25 mmol/L (ref 22–32)
Calcium: 9.2 mg/dL (ref 8.9–10.3)
Chloride: 104 mmol/L (ref 98–111)
Creatinine: 1.1 mg/dL (ref 0.61–1.24)
GFR, Estimated: >60 mL/min (ref >60)
Glucose, Bld: 308 mg/dL — ABNORMAL HIGH (ref 70–99)
Potassium: 3.6 mmol/L (ref 3.5–5.1)
Sodium: 140 mmol/L (ref 135–145)
Total Bilirubin: 0.4 mg/dL (ref 0.3–1.2)
Total Protein: 6.3 g/dL — ABNORMAL LOW (ref 6.5–8.1)

## 2020-07-22 LAB — CBC WITH DIFFERENTIAL (CANCER CENTER ONLY)
Abs Immature Granulocytes: 0.06 10*3/uL (ref 0.00–0.07)
Basophils Absolute: 0.1 10*3/uL (ref 0.0–0.1)
Basophils Relative: 1 %
Eosinophils Absolute: 0.2 10*3/uL (ref 0.0–0.5)
Eosinophils Relative: 4 %
HCT: 35.4 % — ABNORMAL LOW (ref 39.0–52.0)
Hemoglobin: 12.1 g/dL — ABNORMAL LOW (ref 13.0–17.0)
Immature Granulocytes: 1 %
Lymphocytes Relative: 28 %
Lymphs Abs: 1.4 10*3/uL (ref 0.7–4.0)
MCH: 30 pg (ref 26.0–34.0)
MCHC: 34.2 g/dL (ref 30.0–36.0)
MCV: 87.6 fL (ref 80.0–100.0)
Monocytes Absolute: 0.4 10*3/uL (ref 0.1–1.0)
Monocytes Relative: 8 %
Neutro Abs: 2.8 10*3/uL (ref 1.7–7.7)
Neutrophils Relative %: 58 %
Platelet Count: 193 10*3/uL (ref 150–400)
RBC: 4.04 MIL/uL — ABNORMAL LOW (ref 4.22–5.81)
RDW: 13.6 % (ref 11.5–15.5)
WBC Count: 4.9 10*3/uL (ref 4.0–10.5)
nRBC: 0 % (ref 0.0–0.2)

## 2020-07-22 MED ORDER — HYDROMORPHONE HCL 2 MG PO TABS: 2.0000 mg | ORAL_TABLET | ORAL | 0 refills | Status: DC | PRN

## 2020-07-22 NOTE — Assessment & Plan Note (Signed)
Since his discharge, he has not been able to sleep well.  Encouraged to practice sleep hygiene and melatonin for now.  If he still has no relief, we will look at some prescription medication for insomnia.

## 2020-07-22 NOTE — Assessment & Plan Note (Signed)
Patient's abdominal pain is not adequately controlled.  His lower abdominal pain has significantly improved.  He still continues to have bandlike pain.  He has been taking morphine sulfate twice a day and intermittently takes oxycodone.  He tells me that oxycodone works at times and does not work at times.  We will switch that short-acting medication to oral Dilaudid 2 mg every 4 hours as needed for pain in addition to the long-acting morphine.

## 2020-07-22 NOTE — Telephone Encounter (Signed)
Scheduled per 06/08 los, patient is notified of 07/07 appointments.

## 2020-07-22 NOTE — Assessment & Plan Note (Addendum)
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  He is second cycle of planned FOLFIRINOX was delayed because of colitis and hospitalization. His abdominal pain has almost resolved, diarrhea has resolved.  He is anticipated to start his second cycle of chemotherapy tomorrow, irinotecan omitted because of severe colitis requiring hospitalization. Labs from today are adequate to proceed with chemotherapy tomorrow.  Anticipate imaging after 4 cycles of chemotherapy.

## 2020-07-22 NOTE — Progress Notes (Signed)
[facility] FOLLOW UP NOTE  Patient Care Team: [name], MD as PCP - General (Hematology and Oncology) Gatha Mayer, MD as Consulting Physician (Gastroenterology) [name], MD as Consulting Physician (Otolaryngology) Elayne Snare, MD as Consulting Physician (Endocrinology) Gaye Pollack, MD as Consulting Physician (Cardiothoracic Surgery) Lucas Mallow, MD as Consulting Physician (Urology) Debara Pickett Nadean Corwin, MD as Consulting Physician (Cardiology) [name], MD as Consulting Physician (Hematology and Oncology) Jonnie Finner, RN as Oncology Nurse Navigator  CHIEF COMPLAINTS/PURPOSE OF CONSULTATION:  Pancreatic cancer, follow up prior to 1 st cycle of FOLFIRINOX  ASSESSMENT & PLAN:   Adenocarcinoma of pancreas, stage 4 ([place] Page) This is a very pleasant 65 year old male patient diagnosed with 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  He is second cycle of planned FOLFIRINOX was delayed because of colitis and hospitalization. His abdominal pain has almost resolved, diarrhea has resolved.  He is anticipated to start his second cycle of chemotherapy tomorrow, irinotecan omitted because of severe colitis requiring hospitalization. Labs from today are adequate to proceed with chemotherapy tomorrow.  Anticipate imaging after 4 cycles of chemotherapy.  Cancer related pain Patient's abdominal pain is not adequately controlled.  His lower abdominal pain has significantly improved.  He still continues to have bandlike pain.  He has been taking morphine sulfate twice a day and intermittently takes oxycodone.  He tells me that oxycodone works at times and does not work at times.  We will switch that short-acting medication to oral Dilaudid 2 mg every 4 hours as needed for pain in addition to the long-acting morphine.  Weight loss,  unintentional He appears to have lost about 9 pounds since last visit but actually gained 3 pounds since hospitalization and discharge.  He is appetite is better and he is eating better according to his wife.  We will continue to monitor this while on treatment.  Insomnia Since his discharge, he has not been able to sleep well.  Encouraged to practice sleep hygiene and melatonin for now.  If he still has no relief, we will look at some prescription medication for insomnia.  No orders of the defined types were placed in this encounter.    HISTORY OF PRESENTING ILLNESS:  Timothy Page 65 y.o. male is here because of new diagnosis of pancreatic cancer.  Oncology History  Adenocarcinoma of pancreas, stage 4 ([place])  06/20/2020 Initial Diagnosis   Adenocarcinoma of pancreas, stage 4 ([place])   07/01/2020 -  Chemotherapy    Patient is on Treatment Plan: PANCREAS MODIFIED FOLFIRINOX Q14D X 4 CYCLES      07/01/2020 Genetic Testing   Negative genetic testing on a custom panel reviewing multicancer and pancreatitis genes.  The Custom-Gene Panel offered by [facility] includes sequencing and/or deletion duplication testing of the following 91 genes: AIP, ALK, APC*, ATM*, AXIN2, BAP1, BARD1, BLM, BMPR1A, BRCA1, BRCA2, BRIP1, CASR, CDC73, CDH1, CDK4, CDKN1B, CDKN1C, CDKN2A (p14ARF), CDKN2A (p16INK4a), CEBPA, CFTR*, CHEK2, CPA1, CTNNA1, CTRC, DICER1*, DIS3L2, EGFR, EPCAM*, FANCC, FH*, FLCN, GATA2, GPC3*, GREM1*, HOXB13, HRAS, KIT, MAX*, MEN1*, MET*, MITF*, MLH1*, MSH2*, MSH3*, MSH6*, MUTYH, NBN, NF1*, NF2, NTHL1, PALB2, PALLD, PDGFRA, PHOX2B*, PMS2*, POLD1*, POLE, POT1, PRKAR1A, PRSS1*, PTCH1, PTEN*, RAD50, RAD51C, RAD51D, RB1*, RECQL4*, RET, RUNX1, SDHA*, SDHAF2, SDHB, SDHC*, SDHD, SMAD4, SMARCA4, SMARCB1, SMARCE1, SPINK1, STK11, SUFU, TERC, TERT, TMEM127, TP53, TSC1*, TSC2, VHL, WRN*, and WT1.  The report date is Jul 01, 2020.    INTERIM HISTORY  Mr. Uptain is here for a follow-up with his wife since his  hospital discharge.  He is scheduled for chemotherapy tomorrow at [facility].  Since his discharge, his diarrhea has resolved, he still has softer stools a couple times a day.  His lower abdominal pain has significantly improved although he can still feel some discomfort there.  He also noticed that his abdominal pain in the upper quadrants may have been slightly better although he has good days and bad days, today is a good day.  His appetite is a little better since his hospital discharge, since his discharge, he gained about 3 pounds of weight.  No fevers or chills.  He has been taking morphine long-acting twice a day for pain control and was wondering if we can change his short acting pain medication. No change in urinary habits.  He also has been dealing with insomnia since his hospitalization and discharge.  He says he sleeps about 2-2 and half hours at night and then he also feels sleepy in the afternoon and he sleeps about 45 minutes but otherwise he has been able to sleep well. Rest of the pertinent 10 point ROS reviewed and negative.  MEDICAL HISTORY:  Past Medical History:  Diagnosis Date  . Arthritis   . Atypical chest pain 04/2017   ACS ruled out 05/11/17  . CAP (community acquired pneumonia) 05/07/2017  . Diabetes mellitus without complication ([place])    Poor control starting fall 2021-->GAD ab neg.  Insulin and C peptide levels wnl but on lower end of normal->Dr Kuma/endo 2022.  [name] Exocrine pancreatic insufficiency 2022  . 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 Hx of adenomatous polyp of colon 07/02/2009   rpt colonoscopy 2026  . Hyperlipidemia    Intol of multiple statins and zetia. [facility] lipid clinic 2022.  [name] Hypertension   . Lipoma    back  . Myocardial infarction ([place])    MI at age 45  . Pancreatic adenocarcinoma ([place]) 06/2020   Mets to liver (?and lungs?->stage 4), w/acute pancreatitis.  [name] Perianal abscess 11/2019   I&D'd by Dr.Thompson,  gen surg, in the ED 11/23/19  . 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 Cammy Copa  . Right ureteral calculus 04/2018   Cystoscopy with right retrograde pyelogram and right ureteral stent placement after stone removal.  . Seasonal allergies   . Stroke ([place])    in 26J no complications   . Tachyarrhythmia 1980   age 69 X 2 over 65 month period.  No recurrence since that time.  . Thoracic aortic aneurysm ([place])    Incidentally discovered, 4.1 cm-->Dr. Cyndia Bent. Rpt CT 08/2018 and 08/2019 stable.  . Vocal cord paralysis 07/2016   Left vocal cord paralysis noted after thyroid surgery.    SURGICAL HISTORY: Past Surgical History:  Procedure Laterality Date  . BIOPSY  06/18/2020   Procedure: BIOPSY;  Surgeon: Rush Landmark Telford Nab., MD;  Location: [facility];  Service: Gastroenterology;;  . [employer]  . COLONOSCOPY W/ POLYPECTOMY  07/02/2009; 01/24/20   2011 adenoma.  2021 adenomas x 3->recall 2026  . CYSTOSCOPY WITH RETROGRADE PYELOGRAM, URETEROSCOPY AND STENT PLACEMENT Right 05/09/2018   Procedure: CYSTOSCOPY WITH RIGHT RETROGRADE RIGHT URETEROSCOPY STONE EXTRACTION RIGHT STENT EXCHANGE;  Surgeon: Lucas Mallow, MD;  Location: [facility];  Service: Urology;  Laterality: Right;  . CYSTOSCOPY/RETROGRADE/URETEROSCOPY/STONE EXTRACTION WITH BASKET Right 04/30/2018  Procedure: CYSTOSCOPY/RETROGRADE/RIGHT URETEROSCOPY/;  Surgeon: Lucas Mallow, MD;  Location: [facility];  Service: Urology;  Laterality: Right;  . ESOPHAGOGASTRODUODENOSCOPY  07/02/2009   NORMAL  . ESOPHAGOGASTRODUODENOSCOPY (EGD) WITH PROPOFOL N/A 06/18/2020   Procedure: ESOPHAGOGASTRODUODENOSCOPY (EGD) WITH PROPOFOL;  Surgeon: Rush Landmark Telford Nab., MD;  Location: [facility];  Service: Gastroenterology;  Laterality: N/A;  . EYE SURGERY    . FINE NEEDLE ASPIRATION  06/18/2020   Procedure: FINE NEEDLE ASPIRATION (FNA) LINEAR;  Surgeon: Irving Copas., MD;   Location: [facility];  Service: Gastroenterology;;  . finger amputaion     with reattachement  . INCISION AND DRAINAGE ABSCESS ANAL  11/23/2019   gen surg (In ED).  . IR IMAGING GUIDED PORT INSERTION  06/22/2020  . LASIK    . THYROIDECTOMY N/A 08/03/2016   Procedure: Total THYROIDECTOMY;  Surgeon: [name], MD;  Location: [facility];  Service: ENT;  Laterality: N/A;  Total Thyroidectomy   . UPPER ESOPHAGEAL ENDOSCOPIC ULTRASOUND (EUS) N/A 06/18/2020   Procedure: UPPER ESOPHAGEAL ENDOSCOPIC ULTRASOUND (EUS);  Surgeon: Irving Copas., MD;  Location: [facility];  Service: Gastroenterology;  Laterality: N/A;  . UPPER GASTROINTESTINAL ENDOSCOPY     with EUS and bx of pancreatic mass. +appearance of candida in esoph.      SOCIAL HISTORY: Social History   Socioeconomic History  . Marital status: Married    Spouse name: Not on file  . Number of children: Not on file  . Years of education: Not on file  . Highest education level: Not on file  Occupational History  . Not on file  Tobacco Use  . Smoking status: Never Smoker  . Smokeless tobacco: Current User    Types: Snuff  Vaping Use  . Vaping Use: Never used  Substance and Sexual Activity  . Alcohol use: No  . Drug use: No  . Sexual activity: Not on file  Other Topics Concern  . Not on file  Social History Narrative   Married, 2 daughters, 1 grandson that lives with him.   Educ: some college   Occup: retired from the [employer].  Retired at 60 but returned to work at some point, now with grandson living with him.   No T/A/Ds.   Social Determinants of Health   Financial Resource Strain: Not on file  Food Insecurity: Not on file  Transportation Needs: Not on file  Physical Activity: Not on file  Stress: Not on file  Social Connections: Not on file  Intimate Partner Violence: Not on file    FAMILY HISTORY: Family History  Problem Relation Age of Onset  . Diabetes Mother   . Cancer Mother        lung  . COPD  Mother   . Brain cancer Mother   . Arthritis Mother   . Hyperlipidemia Mother   . Heart disease Mother   . Hypertension Mother   . Diabetes Father   . Cancer Father        mesothelioma; skin  . Mental illness Father   . Arthritis Father   . Hyperlipidemia Father   . Heart disease Father   . Hypertension Father   . Heart disease Maternal Grandmother        MI @ 68  . Heart disease Maternal Grandfather        MI @ 30  . Diabetes Paternal Grandmother   . Dementia Paternal Grandmother   . Heart disease Paternal Grandfather        MI @  82  . Colon cancer Neg Hx   . Esophageal cancer Neg Hx   . Rectal cancer Neg Hx   . Stomach cancer Neg Hx     ALLERGIES:  is allergic to atenolol, hydroxyzine, tetracycline, atorvastatin, codeine, guaifenesin, and mucinex [guaifenesin er].  MEDICATIONS:  Current Outpatient Medications  Medication Sig Dispense Refill  . Continuous Blood Gluc Sensor (FREESTYLE LIBRE 2 SENSOR) MISC 2 Devices by Does not apply route every 14 (fourteen) days. 2 each 3  . HYDROmorphone (DILAUDID) 2 MG tablet Take 1 tablet (2 mg total) by mouth every 4 (four) hours as needed for moderate pain or severe pain. 45 tablet 0  . insulin glargine (LANTUS SOLOSTAR) 100 UNIT/ML Solostar Pen Give up to 10 units under the skin every evening at bedtime. (Patient taking differently: Inject 20 Units into the skin at bedtime.) 15 mL 1  . Insulin Pen Needle 31G X 5 MM MISC Use with insulin pen 3 times a day 100 each 1  . lactulose (CEPHULAC) 10 g packet Take 10 g by mouth daily.    . Lancets (ONETOUCH DELICA PLUS [id number]) MISC USE TO CHECK BLOOD SUGAR  ONCE DAILY 100 each 3  . levothyroxine (SYNTHROID) 137 MCG tablet TAKE 1 TABLET BY MOUTH  DAILY BEFORE BREAKFAST (Patient taking differently: Take 137 mcg by mouth daily before breakfast.) 90 tablet 3  . lipase/protease/amylase (CREON) 36000 UNITS CPEP capsule Take 2 capsules (72,000 Units total) by mouth 3 (three) times daily with meals.  May also take 1 capsule (36,000 Units total) as needed (with snacks). 240 capsule 11  . LORazepam (ATIVAN) 0.5 MG tablet Take 1 tablet (0.5 mg total) by mouth every 6 (six) hours as needed for anxiety. 30 tablet 0  . morphine (MS CONTIN) 15 MG 12 hr tablet Take 1 tablet (15 mg total) by mouth every 12 (twelve) hours. For cancer pain 60 tablet 0  . omeprazole (PRILOSEC) 20 MG capsule Take 1 capsule (20 mg total) by mouth daily. 30 capsule 0  . ondansetron (ZOFRAN) 8 MG tablet Take 1 tablet (8 mg total) by mouth 2 (two) times daily as needed. Start on day 3 after chemotherapy. 30 tablet 1  . ONETOUCH ULTRA test strip USE TO CHECK BLOOD SUGAR  ONCE DAILY 100 strip 3  . senna-docusate (SENOKOT-S) 8.6-50 MG tablet Take 1 tablet by mouth 2 (two) times daily.     No current facility-administered medications for this visit.      PHYSICAL EXAMINATION: ECOG PERFORMANCE STATUS: 1 - Symptomatic but completely ambulatory  Vitals:   07/22/20 1007  BP: (!) 150/87  Pulse: 79  Resp: 18  Temp: 97.7 F (36.5 C)  SpO2: 99%   Filed Weights   07/22/20 1007  Weight: 179 lb (81.2 kg)    Physical Exam Constitutional:      General: He is not in acute distress.    Appearance: Normal appearance.  HENT:     Head: Normocephalic and atraumatic.  Cardiovascular:     Rate and Rhythm: Normal rate and regular rhythm.     Pulses: Normal pulses.     Heart sounds: Normal heart sounds.  Pulmonary:     Effort: Pulmonary effort is normal.     Breath sounds: Normal breath sounds.  Abdominal:     General: Abdomen is flat. Bowel sounds are normal.     Palpations: Abdomen is soft.     Tenderness: There is abdominal tenderness (RUQ and LUQ). There is no right CVA tenderness or guarding.  Musculoskeletal:        General: Normal range of motion.     Cervical back: Normal range of motion and neck supple.  Lymphadenopathy:     Cervical: No cervical adenopathy.  Skin:    General: Skin is warm and dry.   Neurological:     General: No focal deficit present.     Mental Status: He is alert.  Psychiatric:        Mood and Affect: Mood normal.        Behavior: Behavior normal.     LABORATORY DATA:  I have reviewed the data as listed Lab Results  Component Value Date   WBC 4.9 07/22/2020   HGB 12.1 (L) 07/22/2020   HCT 35.4 (L) 07/22/2020   MCV 87.6 07/22/2020   PLT 193 07/22/2020     Chemistry      Component Value Date/Time   NA 140 07/22/2020 0948   K 3.6 07/22/2020 0948   CL 104 07/22/2020 0948   CO2 25 07/22/2020 0948   BUN 13 07/22/2020 0948   CREATININE 1.10 07/22/2020 0948   CREATININE 1.11 11/03/2017 1533      Component Value Date/Time   CALCIUM 9.2 07/22/2020 0948   ALKPHOS 68 07/22/2020 0948   AST 18 07/22/2020 0948   ALT 31 07/22/2020 0948   BILITOT 0.4 07/22/2020 0948      RADIOGRAPHIC STUDIES: I have personally reviewed the radiological images as listed and agreed with the findings in the report. CT ABDOMEN PELVIS WO CONTRAST  Result Date: 07/13/2020 CLINICAL DATA:  Lower abdominal pain for several days EXAM: CT ABDOMEN AND PELVIS WITHOUT CONTRAST TECHNIQUE: Multidetector CT imaging of the abdomen and pelvis was performed following the standard protocol without IV contrast. COMPARISON:  07/06/2020 FINDINGS: Lower chest: Lung bases are free of acute infiltrate or sizable effusion. Previously seen pulmonary nodules are not within the field of view. Hepatobiliary: Liver is within normal limits. Gallbladder is well distended without wall thickening or pericholecystic fluid. Pancreas: Fullness in the region of the pancreatic head is again seen and stable. Focal hypodensity is noted. Pancreatic duct is dilated but stable in appearance. Spleen: Normal in size without focal abnormality. Adrenals/Urinary Tract: Adrenal glands are within normal limits. Kidneys are well visualized bilaterally. No renal calculi or obstructive changes are seen. Bladder is decompressed.  Stomach/Bowel: New rectal wall thickening and perirectal inflammatory changes are noted. These inflammatory changes extend superiorly into the sigmoid and descending colon. Transverse colon and ascending colon appear within normal limits. The appendix is within normal limits. No small bowel or gastric abnormality is noted. Vascular/Lymphatic: Atherosclerotic calcifications are noted. No sizable lymphadenopathy is noted. Reproductive: Prostate is unremarkable. Other: No abdominal wall hernia or abnormality. No abdominopelvic ascites. Musculoskeletal: No acute or significant osseous findings. Degenerative changes are noted. IMPRESSION: New colonic wall thickening and pericolonic inflammatory changes involving the distal colon from the mid descending portion to the level of the anus. These changes are most consistent with focal colitis. No abscess or perforation is noted. Stable changes in the region of the head of the pancreas similar to that seen on prior exam. Electronically Signed   By: Inez Catalina M.D.   On: 07/13/2020 18:45   CT ABDOMEN PELVIS WO CONTRAST  Result Date: 07/06/2020 CLINICAL DATA:  Nonlocalized acute abdominal pain. Mid abdominal pain since yesterday. History of pancreatic cancer in May ongoing chemo EXAM: CT ABDOMEN AND PELVIS WITHOUT CONTRAST TECHNIQUE: Multidetector CT imaging of the abdomen and pelvis was performed following  the standard protocol without IV contrast. COMPARISON:  CT angio chest 09/11/2019, CT chest 06/18/2020, CT abdomen 06/14/2020, MRI abdomen 06/15/2020 FINDINGS: Lower chest: Redemonstration of a 6 mm in 5 mm left lower lobe pulmonary nodule (2:1). Redemonstration of a 4 mm right middle lobe pulmonary nodule (2:6). Coronary artery calcifications. Hepatobiliary: No focal liver abnormality. No CT findings of calcified gallstone. No gallbladder wall thickening or pericholecystic fluid. No biliary dilatation. Pancreas: Redemonstration of 1.4 x 1 cm pancreatic head hypodensity  with associated distal main pancreatic duct dilatation measuring up to at least 0.9 cm. Otherwise normal pancreatic contour. No surrounding inflammatory changes. No main pancreatic ductal dilatation. Spleen: Normal in size without focal abnormality. Adrenals/Urinary Tract: No adrenal nodule bilaterally. No nephrolithiasis, no hydronephrosis, and no contour-deforming renal mass. No ureterolithiasis or hydroureter. The urinary bladder is unremarkable. Stomach/Bowel: Stomach is within normal limits. No evidence of bowel wall thickening or dilatation. Appendix appears normal. Vascular/Lymphatic: No abdominal aorta or iliac aneurysm. Mild atherosclerotic plaque of the aorta and its branches. No abdominal, pelvic, or inguinal lymphadenopathy. Reproductive: Prostate is unremarkable. Other: No intraperitoneal free fluid. No intraperitoneal free gas. No organized fluid collection. Musculoskeletal: No abdominal wall hernia or abnormality. No suspicious lytic or blastic osseous lesions. No acute displaced fracture. Multilevel degenerative changes of the spine. IMPRESSION: 1. Please note limited evaluation in a patient with known pancreatic cancer. Pulmonary nodules and pancreatic mass with associated main pancreatic duct dilatation appear grossly stable. 2. Otherwise no acute intra-abdominal or intrapelvic abnormality. Electronically Signed   By: Iven Finn M.D.   On: 07/06/2020 22:38    All questions were answered. The patient knows to call the clinic with any problems, questions or concerns. I spent over 40 minutes in the care of this patient including review of pain management, review of insomnia management, modifications in the chemotherapy plan for the next cycle, review of labs and follow-up recommendations.    [name], MD 07/22/2020 12:34 PM

## 2020-07-22 NOTE — Assessment & Plan Note (Signed)
He appears to have lost about 9 pounds since last visit but actually gained 3 pounds since hospitalization and discharge.  He is appetite is better and he is eating better according to his wife.  We will continue to monitor this while on treatment.

## 2020-07-23 ENCOUNTER — Inpatient Hospital Stay: Payer: 59

## 2020-07-23 ENCOUNTER — Telehealth: Payer: Self-pay

## 2020-07-23 ENCOUNTER — Other Ambulatory Visit: Payer: 59

## 2020-07-23 VITALS — BP 181/93 | HR 75 | Temp 98.6°F | Resp 19

## 2020-07-23 DIAGNOSIS — C259 Malignant neoplasm of pancreas, unspecified: Secondary | ICD-10-CM

## 2020-07-23 DIAGNOSIS — Z5111 Encounter for antineoplastic chemotherapy: Secondary | ICD-10-CM | POA: Diagnosis not present

## 2020-07-23 LAB — CANCER ANTIGEN 19-9: CA 19-9: 41 U/mL — ABNORMAL HIGH (ref 0–35)

## 2020-07-23 MED ORDER — SODIUM CHLORIDE 0.9 % IV SOLN
10.0000 mg | Freq: Once | INTRAVENOUS | Status: AC
Start: 2020-07-23 — End: 2020-07-23
  Administered 2020-07-23: 10 mg via INTRAVENOUS
  Filled 2020-07-23: qty 10

## 2020-07-23 MED ORDER — DEXTROSE 5 % IV SOLN
Freq: Once | INTRAVENOUS | Status: AC
  Filled 2020-07-23: qty 250

## 2020-07-23 MED ORDER — LEUCOVORIN CALCIUM INJECTION 350 MG
400.0000 mg/m2 | Freq: Once | INTRAMUSCULAR | Status: AC
Start: 2020-07-23 — End: 2020-07-23
  Administered 2020-07-23: 812 mg via INTRAVENOUS
  Filled 2020-07-23: qty 25

## 2020-07-23 MED ORDER — ATROPINE SULFATE 1 MG/ML IJ SOLN
0.5000 mg | Freq: Once | INTRAMUSCULAR | Status: DC | PRN
  Administered 2020-07-23: 0.5 mg via INTRAVENOUS

## 2020-07-23 MED ORDER — ATROPINE SULFATE 1 MG/ML IJ SOLN
INTRAMUSCULAR | Status: AC
  Filled 2020-07-23: qty 1

## 2020-07-23 MED ORDER — OXALIPLATIN CHEMO INJECTION 100 MG/20ML
60.0000 mg/m2 | Freq: Once | INTRAVENOUS | Status: AC
  Administered 2020-07-23: 120 mg via INTRAVENOUS
  Filled 2020-07-23: qty 20

## 2020-07-23 MED ORDER — PALONOSETRON HCL INJECTION 0.25 MG/5ML
INTRAVENOUS | Status: AC
  Filled 2020-07-23: qty 5

## 2020-07-23 MED ORDER — SODIUM CHLORIDE 0.9 % IV SOLN
2475.0000 mg/m2 | INTRAVENOUS | Status: DC
  Administered 2020-07-23: 5000 mg via INTRAVENOUS
  Filled 2020-07-23: qty 100

## 2020-07-23 MED ORDER — PALONOSETRON HCL INJECTION 0.25 MG/5ML
0.2500 mg | Freq: Once | INTRAVENOUS | Status: AC
  Administered 2020-07-23: 0.25 mg via INTRAVENOUS

## 2020-07-23 MED ORDER — SODIUM CHLORIDE 0.9 % IV SOLN
150.0000 mg | Freq: Once | INTRAVENOUS | Status: AC
  Administered 2020-07-23: 150 mg via INTRAVENOUS
  Filled 2020-07-23: qty 150

## 2020-07-23 MED ORDER — SODIUM CHLORIDE 0.9 % IV SOLN
400.0000 mg/m2 | Freq: Once | INTRAVENOUS | Status: DC
  Filled 2020-07-23: qty 40.6

## 2020-07-23 NOTE — Patient Instructions (Signed)
Fluorouracil, 5-FU injection What is this medicine? FLUOROURACIL, 5-FU (flure oh YOOR a sil) is a chemotherapy drug. It slows the growth of cancer cells. This medicine is used to treat many types of cancer like breast cancer, colon or rectal cancer, pancreatic cancer, and stomach cancer. This medicine may be used for other purposes; ask your health care provider or pharmacist if you have questions. COMMON BRAND NAME(S): Adrucil What should I tell my health care provider before I take this medicine? They need to know if you have any of these conditions: blood disorders dihydropyrimidine dehydrogenase (DPD) deficiency infection (especially a virus infection such as chickenpox, cold sores, or herpes) kidney disease liver disease malnourished, poor nutrition recent or ongoing radiation therapy an unusual or allergic reaction to fluorouracil, other chemotherapy, other medicines, foods, dyes, or preservatives pregnant or trying to get pregnant breast-feeding How should I use this medicine? This drug is given as an infusion or injection into a vein. It is administered in a hospital or clinic by a specially trained health care professional. Talk to your pediatrician regarding the use of this medicine in children. Special care may be needed. Overdosage: If you think you have taken too much of this medicine contact a poison control center or emergency room at once. NOTE: This medicine is only for you. Do not share this medicine with others. What if I miss a dose? It is important not to miss your dose. Call your doctor or health care professional if you are unable to keep an appointment. What may interact with this medicine? Do not take this medicine with any of the following medications: live virus vaccines This medicine may also interact with the following medications: medicines that treat or prevent blood clots like warfarin, enoxaparin, and dalteparin This list may not describe all possible  interactions. Give your health care provider a list of all the medicines, herbs, non-prescription drugs, or dietary supplements you use. Also tell them if you smoke, drink alcohol, or use illegal drugs. Some items may interact with your medicine. What should I watch for while using this medicine? Visit your doctor for checks on your progress. This drug may make you feel generally unwell. This is not uncommon, as chemotherapy can affect healthy cells as well as cancer cells. Report any side effects. Continue your course of treatment even though you feel ill unless your doctor tells you to stop. In some cases, you may be given additional medicines to help with side effects. Follow all directions for their use. Call your doctor or health care professional for advice if you get a fever, chills or sore throat, or other symptoms of a cold or flu. Do not treat yourself. This drug decreases your body's ability to fight infections. Try to avoid being around people who are sick. This medicine may increase your risk to bruise or bleed. Call your doctor or health care professional if you notice any unusual bleeding. Be careful brushing and flossing your teeth or using a toothpick because you may get an infection or bleed more easily. If you have any dental work done, tell your dentist you are receiving this medicine. Avoid taking products that contain aspirin, acetaminophen, ibuprofen, naproxen, or ketoprofen unless instructed by your doctor. These medicines may hide a fever. Do not become pregnant while taking this medicine. Women should inform their doctor if they wish to become pregnant or think they might be pregnant. There is a potential for serious side effects to an unborn child. Talk to your health  care professional or pharmacist for more information. Do not breast-feed an infant while taking this medicine. Men should inform their doctor if they wish to father a child. This medicine may lower sperm counts. Do  not treat diarrhea with over the counter products. Contact your doctor if you have diarrhea that lasts more than 2 days or if it is severe and watery. This medicine can make you more sensitive to the sun. Keep out of the sun. If you cannot avoid being in the sun, wear protective clothing and use sunscreen. Do not use sun lamps or tanning beds/booths. What side effects may I notice from receiving this medicine? Side effects that you should report to your doctor or health care professional as soon as possible: allergic reactions like skin rash, itching or hives, swelling of the face, lips, or tongue low blood counts - this medicine may decrease the number of white blood cells, red blood cells and platelets. You may be at increased risk for infections and bleeding. signs of infection - fever or chills, cough, sore throat, pain or difficulty passing urine signs of decreased platelets or bleeding - bruising, pinpoint red spots on the skin, black, tarry stools, blood in the urine signs of decreased red blood cells - unusually weak or tired, fainting spells, lightheadedness breathing problems changes in vision chest pain mouth sores nausea and vomiting pain, swelling, redness at site where injected pain, tingling, numbness in the hands or feet redness, swelling, or sores on hands or feet stomach pain unusual bleeding Side effects that usually do not require medical attention (report to your doctor or health care professional if they continue or are bothersome): changes in finger or toe nails diarrhea dry or itchy skin hair loss headache loss of appetite sensitivity of eyes to the light stomach upset unusually teary eyes This list may not describe all possible side effects. Call your doctor for medical advice about side effects. You may report side effects to FDA at 1-800-FDA-1088. Where should I keep my medicine? This drug is given in a hospital or clinic and will not be stored at  home. NOTE: This sheet is a summary. It may not cover all possible information. If you have questions about this medicine, talk to your doctor, pharmacist, or health care provider.  2021 [employer] Standard (2019-01-01 15:00:03) Oxaliplatin Injection What is this medicine? OXALIPLATIN (ox AL i PLA tin) is a chemotherapy drug. It targets fast dividing cells, like cancer cells, and causes these cells to die. This medicine is used to treat cancers of the colon and rectum, and many other cancers. This medicine may be used for other purposes; ask your health care provider or pharmacist if you have questions. COMMON BRAND NAME(S): Eloxatin What should I tell my health care provider before I take this medicine? They need to know if you have any of these conditions: heart disease history of irregular heartbeat liver disease low blood counts, like white cells, platelets, or red blood cells lung or breathing disease, like asthma take medicines that treat or prevent blood clots tingling of the fingers or toes, or other nerve disorder an unusual or allergic reaction to oxaliplatin, other chemotherapy, other medicines, foods, dyes, or preservatives pregnant or trying to get pregnant breast-feeding How should I use this medicine? This drug is given as an infusion into a vein. It is administered in a hospital or clinic by a specially trained health care professional. Talk to your pediatrician regarding the use of this medicine in children. Special care  may be needed. Overdosage: If you think you have taken too much of this medicine contact a poison control center or emergency room at once. NOTE: This medicine is only for you. Do not share this medicine with others. What if I miss a dose? It is important not to miss a dose. Call your doctor or health care professional if you are unable to keep an appointment. What may interact with this medicine? Do not take this medicine with any of the following  medications: cisapride dronedarone pimozide thioridazine This medicine may also interact with the following medications: aspirin and aspirin-like medicines certain medicines that treat or prevent blood clots like warfarin, apixaban, dabigatran, and rivaroxaban cisplatin cyclosporine diuretics medicines for infection like acyclovir, adefovir, amphotericin B, bacitracin, cidofovir, foscarnet, ganciclovir, gentamicin, pentamidine, vancomycin NSAIDs, medicines for pain and inflammation, like ibuprofen or naproxen other medicines that prolong the QT interval (an abnormal heart rhythm) pamidronate zoledronic acid This list may not describe all possible interactions. Give your health care provider a list of all the medicines, herbs, non-prescription drugs, or dietary supplements you use. Also tell them if you smoke, drink alcohol, or use illegal drugs. Some items may interact with your medicine. What should I watch for while using this medicine? Your condition will be monitored carefully while you are receiving this medicine. You may need blood work done while you are taking this medicine. This medicine may make you feel generally unwell. This is not uncommon as chemotherapy can affect healthy cells as well as cancer cells. Report any side effects. Continue your course of treatment even though you feel ill unless your healthcare professional tells you to stop. This medicine can make you more sensitive to cold. Do not drink cold drinks or use ice. Cover exposed skin before coming in contact with cold temperatures or cold objects. When out in cold weather wear warm clothing and cover your mouth and nose to warm the air that goes into your lungs. Tell your doctor if you get sensitive to the cold. Do not become pregnant while taking this medicine or for 9 months after stopping it. Women should inform their health care professional if they wish to become pregnant or think they might be pregnant. Men should  not father a child while taking this medicine and for 6 months after stopping it. There is potential for serious side effects to an unborn child. Talk to your health care professional for more information. Do not breast-feed a child while taking this medicine or for 3 months after stopping it. This medicine has caused ovarian failure in some women. This medicine may make it more difficult to get pregnant. Talk to your health care professional if you are concerned about your fertility. This medicine has caused decreased sperm counts in some men. This may make it more difficult to father a child. Talk to your health care professional if you are concerned about your fertility. This medicine may increase your risk of getting an infection. Call your health care professional for advice if you get a fever, chills, or sore throat, or other symptoms of a cold or flu. Do not treat yourself. Try to avoid being around people who are sick. Avoid taking medicines that contain aspirin, acetaminophen, ibuprofen, naproxen, or ketoprofen unless instructed by your health care professional. These medicines may hide a fever. Be careful brushing or flossing your teeth or using a toothpick because you may get an infection or bleed more easily. If you have any dental work done, tell your  dentist you are receiving this medicine. What side effects may I notice from receiving this medicine? Side effects that you should report to your doctor or health care professional as soon as possible: allergic reactions like skin rash, itching or hives, swelling of the face, lips, or tongue breathing problems cough low blood counts - this medicine may decrease the number of white blood cells, red blood cells, and platelets. You may be at increased risk for infections and bleeding nausea, vomiting pain, redness, or irritation at site where injected pain, tingling, numbness in the hands or feet signs and symptoms of bleeding such as bloody  or black, tarry stools; red or dark brown urine; spitting up blood or brown material that looks like coffee grounds; red spots on the skin; unusual bruising or bleeding from the eyes, gums, or nose signs and symptoms of a dangerous change in heartbeat or heart rhythm like chest pain; dizziness; fast, irregular heartbeat; palpitations; feeling faint or lightheaded; falls signs and symptoms of infection like fever; chills; cough; sore throat; pain or trouble passing urine signs and symptoms of liver injury like dark yellow or brown urine; general ill feeling or flu-like symptoms; light-colored stools; loss of appetite; nausea; right upper belly pain; unusually weak or tired; yellowing of the eyes or skin signs and symptoms of low red blood cells or anemia such as unusually weak or tired; feeling faint or lightheaded; falls signs and symptoms of muscle injury like dark urine; trouble passing urine or change in the amount of urine; unusually weak or tired; muscle pain; back pain Side effects that usually do not require medical attention (report to your doctor or health care professional if they continue or are bothersome): changes in taste diarrhea gas hair loss loss of appetite mouth sores This list may not describe all possible side effects. Call your doctor for medical advice about side effects. You may report side effects to FDA at 1-800-FDA-1088. Where should I keep my medicine? This drug is given in a hospital or clinic and will not be stored at home. NOTE: This sheet is a summary. It may not cover all possible information. If you have questions about this medicine, talk to your doctor, pharmacist, or health care provider.  2021 [employer]/Gold Standard (2018-06-20 12:20:35) Leucovorin injection What is this medicine? LEUCOVORIN (loo koe VOR in) is used to prevent or treat the harmful effects of some medicines. This medicine is used to treat anemia caused by a low amount of folic acid in the  body. It is also used with 5-fluorouracil (5-FU) to treat colon cancer. This medicine may be used for other purposes; ask your health care provider or pharmacist if you have questions. What should I tell my health care provider before I take this medicine? They need to know if you have any of these conditions: anemia from low levels of vitamin B-12 in the blood an unusual or allergic reaction to leucovorin, folic acid, other medicines, foods, dyes, or preservatives pregnant or trying to get pregnant breast-feeding How should I use this medicine? This medicine is for injection into a muscle or into a vein. It is given by a health care professional in a hospital or clinic setting. Talk to your pediatrician regarding the use of this medicine in children. Special care may be needed. Overdosage: If you think you have taken too much of this medicine contact a poison control center or emergency room at once. NOTE: This medicine is only for you. Do not share this medicine with  others. What if I miss a dose? This does not apply. What may interact with this medicine? capecitabine fluorouracil phenobarbital phenytoin primidone trimethoprim-sulfamethoxazole This list may not describe all possible interactions. Give your health care provider a list of all the medicines, herbs, non-prescription drugs, or dietary supplements you use. Also tell them if you smoke, drink alcohol, or use illegal drugs. Some items may interact with your medicine. What should I watch for while using this medicine? Your condition will be monitored carefully while you are receiving this medicine. This medicine may increase the side effects of 5-fluorouracil, 5-FU. Tell your doctor or health care professional if you have diarrhea or mouth sores that do not get better or that get worse. What side effects may I notice from receiving this medicine? Side effects that you should report to your doctor or health care professional as  soon as possible: allergic reactions like skin rash, itching or hives, swelling of the face, lips, or tongue breathing problems fever, infection mouth sores unusual bleeding or bruising unusually weak or tired Side effects that usually do not require medical attention (report to your doctor or health care professional if they continue or are bothersome): constipation or diarrhea loss of appetite nausea, vomiting This list may not describe all possible side effects. Call your doctor for medical advice about side effects. You may report side effects to FDA at 1-800-FDA-1088. Where should I keep my medicine? This drug is given in a hospital or clinic and will not be stored at home. NOTE: This sheet is a summary. It may not cover all possible information. If you have questions about this medicine, talk to your doctor, pharmacist, or health care provider.  2021 [employer]/Gold Standard (2007-08-07 16:50:29)

## 2020-07-23 NOTE — Telephone Encounter (Signed)
Per patient's DPR, left detailed voicemail regarding blood sugars. Dr Chryl Heck advised him to follow up with PCP for dose adjustments to his insulin regimen.

## 2020-07-25 ENCOUNTER — Other Ambulatory Visit: Payer: Self-pay

## 2020-07-25 ENCOUNTER — Inpatient Hospital Stay: Payer: 59

## 2020-07-25 VITALS — BP 147/91 | HR 98 | Temp 97.6°F | Resp 18

## 2020-07-25 DIAGNOSIS — Z5111 Encounter for antineoplastic chemotherapy: Secondary | ICD-10-CM | POA: Diagnosis not present

## 2020-07-25 DIAGNOSIS — C259 Malignant neoplasm of pancreas, unspecified: Secondary | ICD-10-CM

## 2020-07-25 MED ORDER — SODIUM CHLORIDE 0.9% FLUSH
10.0000 mL | INTRAVENOUS | Status: DC | PRN
  Administered 2020-07-25: 10 mL
  Filled 2020-07-25: qty 10

## 2020-07-25 MED ORDER — HEPARIN SOD (PORK) LOCK FLUSH 100 UNIT/ML IV SOLN
500.0000 [IU] | Freq: Once | INTRAVENOUS | Status: AC | PRN
  Administered 2020-07-25: 500 [IU]
  Filled 2020-07-25: qty 5

## 2020-07-27 ENCOUNTER — Telehealth: Payer: Self-pay

## 2020-07-27 ENCOUNTER — Other Ambulatory Visit: Payer: Self-pay

## 2020-07-27 NOTE — Telephone Encounter (Signed)
Called patient to follow-up on after hours phone call. Spoke with patient's wife. She stated they were able to get everything they needed and had no further issues at this time.

## 2020-07-28 ENCOUNTER — Ambulatory Visit: Payer: 59 | Admitting: Hematology and Oncology

## 2020-07-28 ENCOUNTER — Encounter: Payer: 59 | Admitting: Nutrition

## 2020-07-28 ENCOUNTER — Ambulatory Visit: Payer: 59

## 2020-07-28 ENCOUNTER — Other Ambulatory Visit: Payer: 59

## 2020-08-03 ENCOUNTER — Other Ambulatory Visit: Payer: Self-pay

## 2020-08-03 DIAGNOSIS — C259 Malignant neoplasm of pancreas, unspecified: Secondary | ICD-10-CM

## 2020-08-03 MED ORDER — OMEPRAZOLE 20 MG PO CPDR: 20.0000 mg | DELAYED_RELEASE_CAPSULE | Freq: Every day | ORAL | 0 refills | Status: DC

## 2020-08-03 MED ORDER — ONDANSETRON HCL 8 MG PO TABS: 8.0000 mg | ORAL_TABLET | Freq: Two times a day (BID) | ORAL | 1 refills | Status: DC | PRN

## 2020-08-04 ENCOUNTER — Other Ambulatory Visit (HOSPITAL_COMMUNITY): Payer: Self-pay

## 2020-08-04 MED ORDER — PROCHLORPERAZINE MALEATE 10 MG PO TABS: 10.0000 mg | ORAL_TABLET | Freq: Four times a day (QID) | ORAL | 1 refills | Status: DC | PRN

## 2020-08-04 MED ORDER — OMEPRAZOLE 20 MG PO CPDR: 20.0000 mg | DELAYED_RELEASE_CAPSULE | Freq: Every day | ORAL | 0 refills | Status: DC

## 2020-08-05 ENCOUNTER — Inpatient Hospital Stay (HOSPITAL_BASED_OUTPATIENT_CLINIC_OR_DEPARTMENT_OTHER): Payer: 59 | Admitting: Hematology and Oncology

## 2020-08-05 ENCOUNTER — Inpatient Hospital Stay: Payer: 59 | Admitting: Nutrition

## 2020-08-05 ENCOUNTER — Inpatient Hospital Stay: Payer: 59

## 2020-08-05 ENCOUNTER — Encounter: Payer: Self-pay | Admitting: Hematology and Oncology

## 2020-08-05 ENCOUNTER — Other Ambulatory Visit: Payer: Self-pay

## 2020-08-05 VITALS — BP 152/88 | HR 65 | Temp 98.4°F | Resp 18 | Ht 69.0 in | Wt 178.9 lb

## 2020-08-05 DIAGNOSIS — G893 Neoplasm related pain (acute) (chronic): Secondary | ICD-10-CM

## 2020-08-05 DIAGNOSIS — C259 Malignant neoplasm of pancreas, unspecified: Secondary | ICD-10-CM

## 2020-08-05 DIAGNOSIS — Z5111 Encounter for antineoplastic chemotherapy: Secondary | ICD-10-CM | POA: Diagnosis not present

## 2020-08-05 DIAGNOSIS — R634 Abnormal weight loss: Secondary | ICD-10-CM

## 2020-08-05 DIAGNOSIS — Z95828 Presence of other vascular implants and grafts: Secondary | ICD-10-CM

## 2020-08-05 LAB — CBC WITH DIFFERENTIAL (CANCER CENTER ONLY)
Abs Immature Granulocytes: 0.01 10*3/uL (ref 0.00–0.07)
Basophils Absolute: 0 10*3/uL (ref 0.0–0.1)
Basophils Relative: 0 %
Eosinophils Absolute: 0.1 10*3/uL (ref 0.0–0.5)
Eosinophils Relative: 3 %
HCT: 32.1 % — ABNORMAL LOW (ref 39.0–52.0)
Hemoglobin: 11.2 g/dL — ABNORMAL LOW (ref 13.0–17.0)
Immature Granulocytes: 0 %
Lymphocytes Relative: 22 %
Lymphs Abs: 1 10*3/uL (ref 0.7–4.0)
MCH: 30.8 pg (ref 26.0–34.0)
MCHC: 34.9 g/dL (ref 30.0–36.0)
MCV: 88.2 fL (ref 80.0–100.0)
Monocytes Absolute: 0.3 10*3/uL (ref 0.1–1.0)
Monocytes Relative: 7 %
Neutro Abs: 3.1 10*3/uL (ref 1.7–7.7)
Neutrophils Relative %: 68 %
Platelet Count: 130 10*3/uL — ABNORMAL LOW (ref 150–400)
RBC: 3.64 MIL/uL — ABNORMAL LOW (ref 4.22–5.81)
RDW: 14.3 % (ref 11.5–15.5)
WBC Count: 4.7 10*3/uL (ref 4.0–10.5)
nRBC: 0 % (ref 0.0–0.2)

## 2020-08-05 LAB — CMP (CANCER CENTER ONLY)
ALT: 30 U/L (ref 0–44)
AST: 17 U/L (ref 15–41)
Albumin: 3.1 g/dL — ABNORMAL LOW (ref 3.5–5.0)
Alkaline Phosphatase: 68 U/L (ref 38–126)
Anion gap: 8 (ref 5–15)
BUN: 10 mg/dL (ref 8–23)
CO2: 25 mmol/L (ref 22–32)
Calcium: 9.3 mg/dL (ref 8.9–10.3)
Chloride: 106 mmol/L (ref 98–111)
Creatinine: 0.96 mg/dL (ref 0.61–1.24)
GFR, Estimated: >60 mL/min (ref >60)
Glucose, Bld: 275 mg/dL — ABNORMAL HIGH (ref 70–99)
Potassium: 4.3 mmol/L (ref 3.5–5.1)
Sodium: 139 mmol/L (ref 135–145)
Total Bilirubin: 0.4 mg/dL (ref 0.3–1.2)
Total Protein: 6.1 g/dL — ABNORMAL LOW (ref 6.5–8.1)

## 2020-08-05 MED ORDER — SODIUM CHLORIDE 0.9 % IV SOLN: 2400.0000 mg/m2 | INTRAVENOUS | Status: DC

## 2020-08-05 MED ORDER — PALONOSETRON HCL INJECTION 0.25 MG/5ML
INTRAVENOUS | Status: AC
  Filled 2020-08-05: qty 5

## 2020-08-05 MED ORDER — SODIUM CHLORIDE 0.9% FLUSH
10.0000 mL | INTRAVENOUS | Status: DC | PRN
Start: 2020-08-05 — End: 2020-08-05
  Administered 2020-08-05: 10 mL via INTRAVENOUS
  Filled 2020-08-05: qty 10

## 2020-08-05 MED ORDER — HEPARIN SOD (PORK) LOCK FLUSH 100 UNIT/ML IV SOLN
500.0000 [IU] | Freq: Once | INTRAVENOUS | Status: DC | PRN
  Filled 2020-08-05: qty 5

## 2020-08-05 MED ORDER — DEXTROSE 5 % IV SOLN
Freq: Once | INTRAVENOUS | Status: AC
  Filled 2020-08-05: qty 250

## 2020-08-05 MED ORDER — SODIUM CHLORIDE 0.9% FLUSH
10.0000 mL | INTRAVENOUS | Status: DC | PRN
  Filled 2020-08-05: qty 10

## 2020-08-05 MED ORDER — SODIUM CHLORIDE 0.9 % IV SOLN
400.0000 mg/m2 | Freq: Once | INTRAVENOUS | Status: DC
  Filled 2020-08-05: qty 40.6

## 2020-08-05 MED ORDER — SODIUM CHLORIDE 0.9 % IV SOLN
2475.0000 mg/m2 | INTRAVENOUS | Status: DC
  Administered 2020-08-05: 5000 mg via INTRAVENOUS
  Filled 2020-08-05: qty 100

## 2020-08-05 MED ORDER — FOSAPREPITANT DIMEGLUMINE INJECTION 150 MG
150.0000 mg | Freq: Once | INTRAVENOUS | Status: AC
  Administered 2020-08-05: 150 mg via INTRAVENOUS
  Filled 2020-08-05: qty 150

## 2020-08-05 MED ORDER — PALONOSETRON HCL INJECTION 0.25 MG/5ML
0.2500 mg | Freq: Once | INTRAVENOUS | Status: AC
  Administered 2020-08-05: 0.25 mg via INTRAVENOUS

## 2020-08-05 MED ORDER — DEXAMETHASONE SODIUM PHOSPHATE 100 MG/10ML IJ SOLN
10.0000 mg | Freq: Once | INTRAMUSCULAR | Status: AC
  Administered 2020-08-05: 10 mg via INTRAVENOUS
  Filled 2020-08-05: qty 10

## 2020-08-05 MED ORDER — OXALIPLATIN CHEMO INJECTION 100 MG/20ML
60.0000 mg/m2 | Freq: Once | INTRAVENOUS | Status: AC
  Administered 2020-08-05: 120 mg via INTRAVENOUS
  Filled 2020-08-05: qty 20

## 2020-08-05 MED ORDER — LEUCOVORIN CALCIUM INJECTION 350 MG
400.0000 mg/m2 | Freq: Once | INTRAVENOUS | Status: AC
  Administered 2020-08-05: 812 mg via INTRAVENOUS
  Filled 2020-08-05: qty 40.6

## 2020-08-05 NOTE — Progress Notes (Signed)
Nutrition follow-up completed with patient during infusion for pancreas cancer.   Weight documented as 178.9 pounds on June 22 stable from 179 pounds on June 8.   Noted patient had recent admission for colitis.  He reports he continues to have nausea but denies vomiting.  He takes nausea medication with improvement.  Reveals he had mouth sores and constipation after treatment.  States moving his bowels felt like he was "moving glass."  He notes elevated blood sugars and states that he has an appointment with his endocrinologist next week.  He is monitoring blood sugars closely.  States he drinks Ensure complete without difficulty.  Pain continues to be ongoing.  Nutrition diagnosis: Unintended weight loss is stable.  Intervention: Continue medication as prescribed by MD. Continue Ensure Plus to supplement calories and protein. Provided education on strategies for improving mouth sores and constipation. Continue increased fluids.  Monitoring, evaluation, goals: Patient will tolerate adequate calories and protein to minimize further weight loss.  Next visit: Thursday, July 7 during infusion.  .**Disclaimer: This note was dictated with voice recognition software. Similar sounding words can inadvertently be transcribed and this note may contain transcription errors which may not have been corrected upon publication of note.**

## 2020-08-05 NOTE — Addendum Note (Signed)
Addended by: Adaline Sill on: 08/05/2020 09:26 PM   Modules accepted: Orders

## 2020-08-05 NOTE — Patient Instructions (Signed)
[facility]    Discharge Instructions:  Thank you for choosing [facility] to provide your oncology and hematology care.   If you have a lab appointment with the [facility], please go directly to the [facility] and check in at the registration area.   Wear comfortable clothing and clothing appropriate for easy access to any Portacath or PICC line.   We strive to give you quality time with your provider. You may need to reschedule your appointment if you arrive late (15 or more minutes).  Arriving late affects you and other patients whose appointments are after yours.  Also, if you miss three or more appointments without notifying the office, you may be dismissed from the clinic at the provider's discretion.      For prescription refill requests, have your pharmacy contact our office and allow 72 hours for refills to be completed.    Today you received the following chemotherapy and/or immunotherapy agents Oxaliplatin (ELOXATIN), Leucovorin & Flourouracil (ADRUCIL).     To help prevent nausea and vomiting after your treatment, we encourage you to take your nausea medication as directed.  BELOW ARE SYMPTOMS THAT SHOULD BE REPORTED IMMEDIATELY: *FEVER GREATER THAN 100.4 F (38 C) OR HIGHER *CHILLS OR SWEATING *NAUSEA AND VOMITING THAT IS NOT CONTROLLED WITH YOUR NAUSEA MEDICATION *UNUSUAL SHORTNESS OF BREATH *UNUSUAL BRUISING OR BLEEDING *URINARY PROBLEMS (pain or burning when urinating, or frequent urination) *BOWEL PROBLEMS (unusual diarrhea, constipation, pain near the anus) TENDERNESS IN MOUTH AND THROAT WITH OR WITHOUT PRESENCE OF ULCERS (sore throat, sores in mouth, or a toothache) UNUSUAL RASH, SWELLING OR PAIN  UNUSUAL VAGINAL DISCHARGE OR ITCHING   Items with * indicate a potential emergency and should be followed up as soon as possible or go to the Emergency Department if any problems should occur.  Please show the CHEMOTHERAPY  ALERT CARD or IMMUNOTHERAPY ALERT CARD at check-in to the Emergency Department and triage nurse.  Should you have questions after your visit or need to cancel or reschedule your appointment, please contact [facility]  Dept: [phone]  and follow the prompts.  Office hours are 8:00 a.m. to 4:30 p.m. Monday - Friday. Please note that voicemails left after 4:00 p.m. may not be returned until the following business day.  We are closed weekends and major holidays. You have access to a nurse at all times for urgent questions. Please call the main number to the clinic Dept: [phone] and follow the prompts.   For any non-urgent questions, you may also contact your provider using MyChart. We now offer e-Visits for anyone 71 and older to request care online for non-urgent symptoms. For details visit [email or web address].   Also download the MyChart app! Go to the app store, search "MyChart", open the app, select [facility], and log in with your MyChart username and password.  Due to Covid, a mask is required upon entering the hospital/clinic. If you do not have a mask, one will be given to you upon arrival. For doctor visits, patients may have 1 support person aged 32 or older with them. For treatment visits, patients cannot have anyone with them due to current Covid guidelines and our immunocompromised population.   Oxaliplatin Injection What is this medicine? OXALIPLATIN (ox AL i PLA tin) is a chemotherapy drug. It targets fast dividing cells, like cancer cells, and causes these cells to die. This medicine is used to treat cancers of the colon and rectum,  and many other cancers. This medicine may be used for other purposes; ask your health care provider or pharmacist if you have questions. COMMON BRAND NAME(S): Eloxatin What should I tell my health care provider before I take this medicine? They need to know if you have any of these conditions: heart  disease history of irregular heartbeat liver disease low blood counts, like white cells, platelets, or red blood cells lung or breathing disease, like asthma take medicines that treat or prevent blood clots tingling of the fingers or toes, or other nerve disorder an unusual or allergic reaction to oxaliplatin, other chemotherapy, other medicines, foods, dyes, or preservatives pregnant or trying to get pregnant breast-feeding How should I use this medicine? This drug is given as an infusion into a vein. It is administered in a hospital or clinic by a specially trained health care professional. Talk to your pediatrician regarding the use of this medicine in children. Special care may be needed. Overdosage: If you think you have taken too much of this medicine contact a poison control center or emergency room at once. NOTE: This medicine is only for you. Do not share this medicine with others. What if I miss a dose? It is important not to miss a dose. Call your doctor or health care professional if you are unable to keep an appointment. What may interact with this medicine? Do not take this medicine with any of the following medications: cisapride dronedarone pimozide thioridazine This medicine may also interact with the following medications: aspirin and aspirin-like medicines certain medicines that treat or prevent blood clots like warfarin, apixaban, dabigatran, and rivaroxaban cisplatin cyclosporine diuretics medicines for infection like acyclovir, adefovir, amphotericin B, bacitracin, cidofovir, foscarnet, ganciclovir, gentamicin, pentamidine, vancomycin NSAIDs, medicines for pain and inflammation, like ibuprofen or naproxen other medicines that prolong the QT interval (an abnormal heart rhythm) pamidronate zoledronic acid This list may not describe all possible interactions. Give your health care provider a list of all the medicines, herbs, non-prescription drugs, or dietary  supplements you use. Also tell them if you smoke, drink alcohol, or use illegal drugs. Some items may interact with your medicine. What should I watch for while using this medicine? Your condition will be monitored carefully while you are receiving this medicine. You may need blood work done while you are taking this medicine. This medicine may make you feel generally unwell. This is not uncommon as chemotherapy can affect healthy cells as well as cancer cells. Report any side effects. Continue your course of treatment even though you feel ill unless your healthcare professional tells you to stop. This medicine can make you more sensitive to cold. Do not drink cold drinks or use ice. Cover exposed skin before coming in contact with cold temperatures or cold objects. When out in cold weather wear warm clothing and cover your mouth and nose to warm the air that goes into your lungs. Tell your doctor if you get sensitive to the cold. Do not become pregnant while taking this medicine or for 9 months after stopping it. Women should inform their health care professional if they wish to become pregnant or think they might be pregnant. Men should not father a child while taking this medicine and for 6 months after stopping it. There is potential for serious side effects to an unborn child. Talk to your health care professional for more information. Do not breast-feed a child while taking this medicine or for 3 months after stopping it. This medicine has caused ovarian  failure in some women. This medicine may make it more difficult to get pregnant. Talk to your health care professional if you are concerned about your fertility. This medicine has caused decreased sperm counts in some men. This may make it more difficult to father a child. Talk to your health care professional if you are concerned about your fertility. This medicine may increase your risk of getting an infection. Call your health care professional  for advice if you get a fever, chills, or sore throat, or other symptoms of a cold or flu. Do not treat yourself. Try to avoid being around people who are sick. Avoid taking medicines that contain aspirin, acetaminophen, ibuprofen, naproxen, or ketoprofen unless instructed by your health care professional. These medicines may hide a fever. Be careful brushing or flossing your teeth or using a toothpick because you may get an infection or bleed more easily. If you have any dental work done, tell your dentist you are receiving this medicine. What side effects may I notice from receiving this medicine? Side effects that you should report to your doctor or health care professional as soon as possible: allergic reactions like skin rash, itching or hives, swelling of the face, lips, or tongue breathing problems cough low blood counts - this medicine may decrease the number of white blood cells, red blood cells, and platelets. You may be at increased risk for infections and bleeding nausea, vomiting pain, redness, or irritation at site where injected pain, tingling, numbness in the hands or feet signs and symptoms of bleeding such as bloody or black, tarry stools; red or dark brown urine; spitting up blood or brown material that looks like coffee grounds; red spots on the skin; unusual bruising or bleeding from the eyes, gums, or nose signs and symptoms of a dangerous change in heartbeat or heart rhythm like chest pain; dizziness; fast, irregular heartbeat; palpitations; feeling faint or lightheaded; falls signs and symptoms of infection like fever; chills; cough; sore throat; pain or trouble passing urine signs and symptoms of liver injury like dark yellow or brown urine; general ill feeling or flu-like symptoms; light-colored stools; loss of appetite; nausea; right upper belly pain; unusually weak or tired; yellowing of the eyes or skin signs and symptoms of low red blood cells or anemia such as unusually  weak or tired; feeling faint or lightheaded; falls signs and symptoms of muscle injury like dark urine; trouble passing urine or change in the amount of urine; unusually weak or tired; muscle pain; back pain Side effects that usually do not require medical attention (report to your doctor or health care professional if they continue or are bothersome): changes in taste diarrhea gas hair loss loss of appetite mouth sores This list may not describe all possible side effects. Call your doctor for medical advice about side effects. You may report side effects to FDA at 1-800-FDA-1088. Where should I keep my medicine? This drug is given in a hospital or clinic and will not be stored at home. NOTE: This sheet is a summary. It may not cover all possible information. If you have questions about this medicine, talk to your doctor, pharmacist, or health care provider.  2021 Elsevier/Gold Standard (2018-06-20 12:20:35)  Irinotecan injection What is this medicine? IRINOTECAN (ir in oh TEE kan ) is a chemotherapy drug. It is used to treat colon and rectal cancer. This medicine may be used for other purposes; ask your health care provider or pharmacist if you have questions. COMMON BRAND NAME(S): Camptosar  What should I tell my health care provider before I take this medicine? They need to know if you have any of these conditions: dehydration diarrhea infection (especially a virus infection such as chickenpox, cold sores, or herpes) liver disease low blood counts, like low white cell, platelet, or red cell counts low levels of calcium, magnesium, or potassium in the blood recent or ongoing radiation therapy an unusual or allergic reaction to irinotecan, other medicines, foods, dyes, or preservatives pregnant or trying to get pregnant breast-feeding How should I use this medicine? This drug is given as an infusion into a vein. It is administered in a hospital or clinic by a specially trained  health care professional. Talk to your pediatrician regarding the use of this medicine in children. Special care may be needed. Overdosage: If you think you have taken too much of this medicine contact a poison control center or emergency room at once. NOTE: This medicine is only for you. Do not share this medicine with others. What if I miss a dose? It is important not to miss your dose. Call your doctor or health care professional if you are unable to keep an appointment. What may interact with this medicine? Do not take this medicine with any of the following medications: cobicistat itraconazole This medicine may interact with the following medications: antiviral medicines for HIV or AIDS certain antibiotics like rifampin or rifabutin certain medicines for fungal infections like ketoconazole, posaconazole, and voriconazole certain medicines for seizures like carbamazepine, phenobarbital, phenotoin clarithromycin gemfibrozil nefazodone St. John's Wort This list may not describe all possible interactions. Give your health care provider a list of all the medicines, herbs, non-prescription drugs, or dietary supplements you use. Also tell them if you smoke, drink alcohol, or use illegal drugs. Some items may interact with your medicine. What should I watch for while using this medicine? Your condition will be monitored carefully while you are receiving this medicine. You will need important blood work done while you are taking this medicine. This drug may make you feel generally unwell. This is not uncommon, as chemotherapy can affect healthy cells as well as cancer cells. Report any side effects. Continue your course of treatment even though you feel ill unless your doctor tells you to stop. In some cases, you may be given additional medicines to help with side effects. Follow all directions for their use. You may get drowsy or dizzy. Do not drive, use machinery, or do anything that needs  mental alertness until you know how this medicine affects you. Do not stand or sit up quickly, especially if you are an older patient. This reduces the risk of dizzy or fainting spells. Call your health care professional for advice if you get a fever, chills, or sore throat, or other symptoms of a cold or flu. Do not treat yourself. This medicine decreases your body's ability to fight infections. Try to avoid being around people who are sick. Avoid taking products that contain aspirin, acetaminophen, ibuprofen, naproxen, or ketoprofen unless instructed by your doctor. These medicines may hide a fever. This medicine may increase your risk to bruise or bleed. Call your doctor or health care professional if you notice any unusual bleeding. Be careful brushing and flossing your teeth or using a toothpick because you may get an infection or bleed more easily. If you have any dental work done, tell your dentist you are receiving this medicine. Do not become pregnant while taking this medicine or for 6 months after stopping it.  Women should inform their health care professional if they wish to become pregnant or think they might be pregnant. Men should not father a child while taking this medicine and for 3 months after stopping it. There is potential for serious side effects to an unborn child. Talk to your health care professional for more information. Do not breast-feed an infant while taking this medicine or for 7 days after stopping it. This medicine has caused ovarian failure in some women. This medicine may make it more difficult to get pregnant. Talk to your health care professional if you are concerned about your fertility. This medicine has caused decreased sperm counts in some men. This may make it more difficult to father a child. Talk to your health care professional if you are concerned about your fertility. What side effects may I notice from receiving this medicine? Side effects that you should  report to your doctor or health care professional as soon as possible: allergic reactions like skin rash, itching or hives, swelling of the face, lips, or tongue chest pain diarrhea flushing, runny nose, sweating during infusion low blood counts - this medicine may decrease the number of white blood cells, red blood cells and platelets. You may be at increased risk for infections and bleeding. nausea, vomiting pain, swelling, warmth in the leg signs of decreased platelets or bleeding - bruising, pinpoint red spots on the skin, black, tarry stools, blood in the urine signs of infection - fever or chills, cough, sore throat, pain or difficulty passing urine signs of decreased red blood cells - unusually weak or tired, fainting spells, lightheadedness Side effects that usually do not require medical attention (report to your doctor or health care professional if they continue or are bothersome): constipation hair loss headache loss of appetite mouth sores stomach pain This list may not describe all possible side effects. Call your doctor for medical advice about side effects. You may report side effects to FDA at 1-800-FDA-1088. Where should I keep my medicine? This drug is given in a hospital or clinic and will not be stored at home. NOTE: This sheet is a summary. It may not cover all possible information. If you have questions about this medicine, talk to your doctor, pharmacist, or health care provider.  2021 Elsevier/Gold Standard (2019-01-01 17:46:13)  Leucovorin injection What is this medicine? LEUCOVORIN (loo koe VOR in) is used to prevent or treat the harmful effects of some medicines. This medicine is used to treat anemia caused by a low amount of folic acid in the body. It is also used with 5-fluorouracil (5-FU) to treat colon cancer. This medicine may be used for other purposes; ask your health care provider or pharmacist if you have questions. What should I tell my health care  provider before I take this medicine? They need to know if you have any of these conditions: anemia from low levels of vitamin B-12 in the blood an unusual or allergic reaction to leucovorin, folic acid, other medicines, foods, dyes, or preservatives pregnant or trying to get pregnant breast-feeding How should I use this medicine? This medicine is for injection into a muscle or into a vein. It is given by a health care professional in a hospital or clinic setting. Talk to your pediatrician regarding the use of this medicine in children. Special care may be needed. Overdosage: If you think you have taken too much of this medicine contact a poison control center or emergency room at once. NOTE: This medicine is only for  you. Do not share this medicine with others. What if I miss a dose? This does not apply. What may interact with this medicine? capecitabine fluorouracil phenobarbital phenytoin primidone trimethoprim-sulfamethoxazole This list may not describe all possible interactions. Give your health care provider a list of all the medicines, herbs, non-prescription drugs, or dietary supplements you use. Also tell them if you smoke, drink alcohol, or use illegal drugs. Some items may interact with your medicine. What should I watch for while using this medicine? Your condition will be monitored carefully while you are receiving this medicine. This medicine may increase the side effects of 5-fluorouracil, 5-FU. Tell your doctor or health care professional if you have diarrhea or mouth sores that do not get better or that get worse. What side effects may I notice from receiving this medicine? Side effects that you should report to your doctor or health care professional as soon as possible: allergic reactions like skin rash, itching or hives, swelling of the face, lips, or tongue breathing problems fever, infection mouth sores unusual bleeding or bruising unusually weak or tired Side  effects that usually do not require medical attention (report to your doctor or health care professional if they continue or are bothersome): constipation or diarrhea loss of appetite nausea, vomiting This list may not describe all possible side effects. Call your doctor for medical advice about side effects. You may report side effects to FDA at 1-800-FDA-1088. Where should I keep my medicine? This drug is given in a hospital or clinic and will not be stored at home. NOTE: This sheet is a summary. It may not cover all possible information. If you have questions about this medicine, talk to your doctor, pharmacist, or health care provider.  2021 Elsevier/Gold Standard (2007-08-07 16:50:29)  Fluorouracil, 5-FU injection What is this medicine? FLUOROURACIL, 5-FU (flure oh YOOR a sil) is a chemotherapy drug. It slows the growth of cancer cells. This medicine is used to treat many types of cancer like breast cancer, colon or rectal cancer, pancreatic cancer, and stomach cancer. This medicine may be used for other purposes; ask your health care provider or pharmacist if you have questions. COMMON BRAND NAME(S): Adrucil What should I tell my health care provider before I take this medicine? They need to know if you have any of these conditions: blood disorders dihydropyrimidine dehydrogenase (DPD) deficiency infection (especially a virus infection such as chickenpox, cold sores, or herpes) kidney disease liver disease malnourished, poor nutrition recent or ongoing radiation therapy an unusual or allergic reaction to fluorouracil, other chemotherapy, other medicines, foods, dyes, or preservatives pregnant or trying to get pregnant breast-feeding How should I use this medicine? This drug is given as an infusion or injection into a vein. It is administered in a hospital or clinic by a specially trained health care professional. Talk to your pediatrician regarding the use of this medicine in  children. Special care may be needed. Overdosage: If you think you have taken too much of this medicine contact a poison control center or emergency room at once. NOTE: This medicine is only for you. Do not share this medicine with others. What if I miss a dose? It is important not to miss your dose. Call your doctor or health care professional if you are unable to keep an appointment. What may interact with this medicine? Do not take this medicine with any of the following medications: live virus vaccines This medicine may also interact with the following medications: medicines that treat or prevent blood  clots like warfarin, enoxaparin, and dalteparin This list may not describe all possible interactions. Give your health care provider a list of all the medicines, herbs, non-prescription drugs, or dietary supplements you use. Also tell them if you smoke, drink alcohol, or use illegal drugs. Some items may interact with your medicine. What should I watch for while using this medicine? Visit your doctor for checks on your progress. This drug may make you feel generally unwell. This is not uncommon, as chemotherapy can affect healthy cells as well as cancer cells. Report any side effects. Continue your course of treatment even though you feel ill unless your doctor tells you to stop. In some cases, you may be given additional medicines to help with side effects. Follow all directions for their use. Call your doctor or health care professional for advice if you get a fever, chills or sore throat, or other symptoms of a cold or flu. Do not treat yourself. This drug decreases your body's ability to fight infections. Try to avoid being around people who are sick. This medicine may increase your risk to bruise or bleed. Call your doctor or health care professional if you notice any unusual bleeding. Be careful brushing and flossing your teeth or using a toothpick because you may get an infection or bleed  more easily. If you have any dental work done, tell your dentist you are receiving this medicine. Avoid taking products that contain aspirin, acetaminophen, ibuprofen, naproxen, or ketoprofen unless instructed by your doctor. These medicines may hide a fever. Do not become pregnant while taking this medicine. Women should inform their doctor if they wish to become pregnant or think they might be pregnant. There is a potential for serious side effects to an unborn child. Talk to your health care professional or pharmacist for more information. Do not breast-feed an infant while taking this medicine. Men should inform their doctor if they wish to father a child. This medicine may lower sperm counts. Do not treat diarrhea with over the counter products. Contact your doctor if you have diarrhea that lasts more than 2 days or if it is severe and watery. This medicine can make you more sensitive to the sun. Keep out of the sun. If you cannot avoid being in the sun, wear protective clothing and use sunscreen. Do not use sun lamps or tanning beds/booths. What side effects may I notice from receiving this medicine? Side effects that you should report to your doctor or health care professional as soon as possible: allergic reactions like skin rash, itching or hives, swelling of the face, lips, or tongue low blood counts - this medicine may decrease the number of white blood cells, red blood cells and platelets. You may be at increased risk for infections and bleeding. signs of infection - fever or chills, cough, sore throat, pain or difficulty passing urine signs of decreased platelets or bleeding - bruising, pinpoint red spots on the skin, black, tarry stools, blood in the urine signs of decreased red blood cells - unusually weak or tired, fainting spells, lightheadedness breathing problems changes in vision chest pain mouth sores nausea and vomiting pain, swelling, redness at site where injected pain,  tingling, numbness in the hands or feet redness, swelling, or sores on hands or feet stomach pain unusual bleeding Side effects that usually do not require medical attention (report to your doctor or health care professional if they continue or are bothersome): changes in finger or toe nails diarrhea dry or itchy skin hair  loss headache loss of appetite sensitivity of eyes to the light stomach upset unusually teary eyes This list may not describe all possible side effects. Call your doctor for medical advice about side effects. You may report side effects to FDA at 1-800-FDA-1088. Where should I keep my medicine? This drug is given in a hospital or clinic and will not be stored at home. NOTE: This sheet is a summary. It may not cover all possible information. If you have questions about this medicine, talk to your doctor, pharmacist, or health care provider.  2021 Elsevier/Gold Standard (2019-01-01 15:00:03)

## 2020-08-05 NOTE — Progress Notes (Signed)
[facility] FOLLOW UP NOTE  Patient Care Team: [name], MD as PCP - General (Hematology and Oncology) Gatha Mayer, MD as Consulting Physician (Gastroenterology) [name], MD as Consulting Physician (Otolaryngology) Elayne Snare, MD as Consulting Physician (Endocrinology) Gaye Pollack, MD as Consulting Physician (Cardiothoracic Surgery) Lucas Mallow, MD as Consulting Physician (Urology) Debara Pickett Nadean Corwin, MD as Consulting Physician (Cardiology) [name], MD as Consulting Physician (Hematology and Oncology) Jonnie Finner, RN as Oncology Nurse Navigator  CHIEF COMPLAINTS/PURPOSE OF CONSULTATION:  Pancreatic cancer, follow up prior to 1 st cycle of FOLFIRINOX  ASSESSMENT & PLAN:   No problem-specific Assessment & Plan notes found for this encounter.  No orders of the defined types were placed in this encounter.    HISTORY OF PRESENTING ILLNESS:   Timothy Page 65 y.o. male is here because of new diagnosis of pancreatic cancer.  Oncology History  Adenocarcinoma of pancreas, stage 4 ([place])  06/20/2020 Initial Diagnosis   Adenocarcinoma of pancreas, stage 4 ([place])   07/01/2020 -  Chemotherapy    Patient is on Treatment Plan: PANCREAS MODIFIED FOLFIRINOX Q14D X 4 CYCLES      07/01/2020 Genetic Testing   Negative genetic testing on a custom panel reviewing multicancer and pancreatitis genes.  The Custom-Gene Panel offered by [facility] includes sequencing and/or deletion duplication testing of the following 91 genes: AIP, ALK, APC*, ATM*, AXIN2, BAP1, BARD1, BLM, BMPR1A, BRCA1, BRCA2, BRIP1, CASR, CDC73, CDH1, CDK4, CDKN1B, CDKN1C, CDKN2A (p14ARF), CDKN2A (p16INK4a), CEBPA, CFTR*, CHEK2, CPA1, CTNNA1, CTRC, DICER1*, DIS3L2, EGFR, EPCAM*, FANCC, FH*, FLCN, GATA2, GPC3*, GREM1*, HOXB13, HRAS, KIT, MAX*, MEN1*, MET*, MITF*, MLH1*, MSH2*, MSH3*, MSH6*, MUTYH, NBN, NF1*, NF2, NTHL1, PALB2, PALLD, PDGFRA, PHOX2B*, PMS2*, POLD1*, POLE, POT1, PRKAR1A,  PRSS1*, PTCH1, PTEN*, RAD50, RAD51C, RAD51D, RB1*, RECQL4*, RET, RUNX1, SDHA*, SDHAF2, SDHB, SDHC*, SDHD, SMAD4, SMARCA4, SMARCB1, SMARCE1, SPINK1, STK11, SUFU, TERC, TERT, TMEM127, TP53, TSC1*, TSC2, VHL, WRN*, and WT1.  The report date is Jul 01, 2020.    INTERIM HISTORY  Mr. Ong is here for a follow-up in preparation for cycle 3. He is tolerating current regimen well. He says he cant go back on FOLFIRINOX, it was way too much for him. He didn't have much mucositis, nausea, vomiting or diarrhea with FOLFOX Pain in the abdomen is reasonably well controlled, he takes MSSR BID, and occasional oxycodone. He did not try dilaudid yet for pain control. He is able to eat and maintaining weight. Rest of the pertinent 10 point ROS reviewed and negative.  MEDICAL HISTORY:  Past Medical History:  Diagnosis Date   Arthritis    Atypical chest pain 04/2017   ACS ruled out 05/11/17   CAP (community acquired pneumonia) 05/07/2017   Diabetes mellitus without complication ([place])    Poor control starting fall 2021-->GAD ab neg.  Insulin and C peptide levels wnl but on lower end of normal->Dr Kuma/endo 2022.   Exocrine pancreatic insufficiency 2022   History of thyroidectomy, total 2018   Hurlthe cell adenoma   Hx of adenomatous polyp of colon 07/02/2009   rpt colonoscopy 2026   Hyperlipidemia    Intol of multiple statins and zetia. [facility] 2022.   Hypertension    Lipoma    back   Myocardial infarction [facility])    MI at age 37   Pancreatic adenocarcinoma ([place]) 06/2020   Mets to liver (?and lungs?->stage 4), w/acute pancreatitis.   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 Cammy Copa   Right ureteral calculus 04/2018   Cystoscopy with right retrograde pyelogram and right ureteral stent placement after stone removal.   Seasonal allergies    Stroke ([place])    in 62E  no complications    Tachyarrhythmia 1980   age 43 X 2 over 70 month period.  No recurrence since that time.   Thoracic aortic aneurysm ([facility])    Incidentally discovered, 4.1 cm-->Dr. Cyndia Bent. Rpt CT 08/2018 and 08/2019 stable.   Vocal cord paralysis 07/2016   Left vocal cord paralysis noted after thyroid surgery.    SURGICAL HISTORY: Past Surgical History:  Procedure Laterality Date   BIOPSY  06/18/2020   Procedure: BIOPSY;  Surgeon: Mansouraty, Telford Nab., MD;  Location: [facility] ENDOSCOPY;  Service: Gastroenterology;;   [place] W/ POLYPECTOMY  07/02/2009; 01/24/20   2011 adenoma.  2021 adenomas x 3->recall 2026   CYSTOSCOPY WITH RETROGRADE PYELOGRAM, URETEROSCOPY AND STENT PLACEMENT Right 05/09/2018   Procedure: CYSTOSCOPY WITH RIGHT RETROGRADE RIGHT URETEROSCOPY STONE EXTRACTION RIGHT STENT EXCHANGE;  Surgeon: Lucas Mallow, MD;  Location: [facility] ORS;  Service: Urology;  Laterality: Right;   CYSTOSCOPY/RETROGRADE/URETEROSCOPY/STONE EXTRACTION WITH BASKET Right 04/30/2018   Procedure: CYSTOSCOPY/RETROGRADE/RIGHT URETEROSCOPY/;  Surgeon: Lucas Mallow, MD;  Location: [facility] ORS;  Service: Urology;  Laterality: Right;   ESOPHAGOGASTRODUODENOSCOPY  07/02/2009   NORMAL   ESOPHAGOGASTRODUODENOSCOPY (EGD) WITH PROPOFOL N/A 06/18/2020   Procedure: ESOPHAGOGASTRODUODENOSCOPY (EGD) WITH PROPOFOL;  Surgeon: Rush Landmark Telford Nab., MD;  Location: [place];  Service: Gastroenterology;  Laterality: N/A;   EYE SURGERY     FINE NEEDLE ASPIRATION  06/18/2020   Procedure: FINE NEEDLE ASPIRATION (FNA) LINEAR;  Surgeon: Irving Copas., MD;  Location: [place];  Service: Gastroenterology;;   finger amputaion     with reattachement   INCISION AND DRAINAGE ABSCESS ANAL  11/23/2019   gen surg (In ED).   IR IMAGING GUIDED PORT INSERTION  06/22/2020   LASIK     THYROIDECTOMY N/A 08/03/2016   Procedure: Total THYROIDECTOMY;  Surgeon: [name], MD;  Location: [place];   Service: ENT;  Laterality: N/A;  Total Thyroidectomy    UPPER ESOPHAGEAL ENDOSCOPIC ULTRASOUND (EUS) N/A 06/18/2020   Procedure: UPPER ESOPHAGEAL ENDOSCOPIC ULTRASOUND (EUS);  Surgeon: Irving Copas., MD;  Location: [place];  Service: Gastroenterology;  Laterality: N/A;   UPPER GASTROINTESTINAL ENDOSCOPY     with EUS and bx of pancreatic mass. +appearance of candida in esoph.      SOCIAL HISTORY: Social History   Socioeconomic History   Marital status: Married    Spouse name: Not on file   Number of children: Not on file   Years of education: Not on file   Highest education level: Not on file  Occupational History   Not on file  Tobacco Use   Smoking status: Never   Smokeless tobacco: Current    Types: Snuff  Vaping Use   Vaping Use: Never used  Substance and Sexual Activity   Alcohol use: No   Drug use: No   Sexual activity: Not on file  Other Topics Concern   Not on file  Social History Narrative   Married, 2 daughters, 1 grandson that lives with him.   Educ: some college   Occup: retired from the [employer].  Retired at 45 but returned to work at some point, now with grandson living with him.  No T/A/Ds.   Social Determinants of Health   Financial Resource Strain: Not on file  Food Insecurity: Not on file  Transportation Needs: Not on file  Physical Activity: Not on file  Stress: Not on file  Social Connections: Not on file  Intimate Partner Violence: Not on file    FAMILY HISTORY: Family History  Problem Relation Age of Onset   Diabetes Mother    Cancer Mother        lung   COPD Mother    Brain cancer Mother    Arthritis Mother    Hyperlipidemia Mother    Heart disease Mother    Hypertension Mother    Diabetes Father    Cancer Father        mesothelioma; skin   Mental illness Father    Arthritis Father    Hyperlipidemia Father    Heart disease Father    Hypertension Father    Heart disease Maternal Grandmother        MI @ 44    Heart disease Maternal Grandfather        MI @ 23   Diabetes Paternal Grandmother    Dementia Paternal Grandmother    Heart disease Paternal Grandfather        MI @ 69   Colon cancer Neg Hx    Esophageal cancer Neg Hx    Rectal cancer Neg Hx    Stomach cancer Neg Hx     ALLERGIES:  is allergic to atenolol, hydroxyzine, tetracycline, atorvastatin, codeine, guaifenesin, and mucinex [guaifenesin er].  MEDICATIONS:  Current Outpatient Medications  Medication Sig Dispense Refill   Continuous Blood Gluc Sensor (FREESTYLE LIBRE 2 SENSOR) MISC 2 Devices by Does not apply route every 14 (fourteen) days. 2 each 3   HYDROmorphone (DILAUDID) 2 MG tablet Take 1 tablet (2 mg total) by mouth every 4 (four) hours as needed for moderate pain or severe pain. 45 tablet 0   insulin glargine (LANTUS SOLOSTAR) 100 UNIT/ML Solostar Pen Give up to 10 units under the skin every evening at bedtime. (Patient taking differently: Inject 20 Units into the skin at bedtime.) 15 mL 1   Insulin Pen Needle 31G X 5 MM MISC Use with insulin pen 3 times a day 100 each 1   lactulose (CEPHULAC) 10 g packet Take 10 g by mouth daily.     Lancets (ONETOUCH DELICA PLUS [id number]) MISC USE TO CHECK BLOOD SUGAR  ONCE DAILY 100 each 3   levothyroxine (SYNTHROID) 137 MCG tablet TAKE 1 TABLET BY MOUTH  DAILY BEFORE BREAKFAST (Patient taking differently: Take 137 mcg by mouth daily before breakfast.) 90 tablet 3   lipase/protease/amylase (CREON) 36000 UNITS CPEP capsule Take 2 capsules (72,000 Units total) by mouth 3 (three) times daily with meals. May also take 1 capsule (36,000 Units total) as needed (with snacks). 240 capsule 11   LORazepam (ATIVAN) 0.5 MG tablet Take 1 tablet (0.5 mg total) by mouth every 6 (six) hours as needed for anxiety. 30 tablet 0   magic mouthwash SOLN SWISH WITH 30 MLS BY MOUTH FOUR TIMES DAILY AND SWALLOW. NO FOOD OR DRINK FOR 1 HOUR AFTERWARDS     morphine (MS CONTIN) 15 MG 12 hr tablet Take 1 tablet (15 mg  total) by mouth every 12 (twelve) hours. For cancer pain 60 tablet 0   omeprazole (PRILOSEC) 20 MG capsule Take 1 capsule (20 mg total) by mouth daily. 30 capsule 0   ondansetron (ZOFRAN) 8 MG tablet Take 1 tablet (  8 mg total) by mouth 2 (two) times daily as needed. Start on day 3 after chemotherapy. 30 tablet 1   ONETOUCH ULTRA test strip USE TO CHECK BLOOD SUGAR  ONCE DAILY 100 strip 3   prochlorperazine (COMPAZINE) 10 MG tablet Take 1 tablet (10 mg total) by mouth every 6 (six) hours as needed for nausea or vomiting. 30 tablet 1   senna-docusate (SENOKOT-S) 8.6-50 MG tablet Take 1 tablet by mouth 2 (two) times daily.     No current facility-administered medications for this visit.      PHYSICAL EXAMINATION: ECOG PERFORMANCE STATUS: 1 - Symptomatic but completely ambulatory  Vitals:   08/05/20 1109  BP: (!) 152/88  Pulse: 65  Resp: 18  Temp: 98.4 F (36.9 C)  SpO2: 100%   Filed Weights   08/05/20 1109  Weight: 178 lb 14.4 oz (81.1 kg)    Physical Exam Constitutional:      General: He is not in acute distress.    Appearance: Normal appearance.  HENT:     Head: Normocephalic and atraumatic.  Cardiovascular:     Rate and Rhythm: Normal rate and regular rhythm.     Pulses: Normal pulses.     Heart sounds: Normal heart sounds.  Pulmonary:     Effort: Pulmonary effort is normal.     Breath sounds: Normal breath sounds.  Abdominal:     General: Abdomen is flat. Bowel sounds are normal.     Palpations: Abdomen is soft.     Tenderness: There is abdominal tenderness (LUQ radiating to LLQ). There is no right CVA tenderness or guarding.  Musculoskeletal:        General: Normal range of motion.     Cervical back: Normal range of motion and neck supple.  Lymphadenopathy:     Cervical: No cervical adenopathy.  Skin:    General: Skin is warm and dry.  Neurological:     General: No focal deficit present.     Mental Status: He is alert.  Psychiatric:        Mood and Affect:  Mood normal.        Behavior: Behavior normal.    LABORATORY DATA:  I have reviewed the data as listed Lab Results  Component Value Date   WBC 4.7 08/05/2020   HGB 11.2 (L) 08/05/2020   HCT 32.1 (L) 08/05/2020   MCV 88.2 08/05/2020   PLT 130 (L) 08/05/2020     Chemistry      Component Value Date/Time   NA 140 07/22/2020 0948   K 3.6 07/22/2020 0948   CL 104 07/22/2020 0948   CO2 25 07/22/2020 0948   BUN 13 07/22/2020 0948   CREATININE 1.10 07/22/2020 0948   CREATININE 1.11 11/03/2017 1533      Component Value Date/Time   CALCIUM 9.2 07/22/2020 0948   ALKPHOS 68 07/22/2020 0948   AST 18 07/22/2020 0948   ALT 31 07/22/2020 0948   BILITOT 0.4 07/22/2020 0948      RADIOGRAPHIC STUDIES: I have personally reviewed the radiological images as listed and agreed with the findings in the report. CT ABDOMEN PELVIS WO CONTRAST  Result Date: 07/13/2020 CLINICAL DATA:  Lower abdominal pain for several days EXAM: CT ABDOMEN AND PELVIS WITHOUT CONTRAST TECHNIQUE: Multidetector CT imaging of the abdomen and pelvis was performed following the standard protocol without IV contrast. COMPARISON:  07/06/2020 FINDINGS: Lower chest: Lung bases are free of acute infiltrate or sizable effusion. Previously seen pulmonary nodules are not within the  field of view. Hepatobiliary: Liver is within normal limits. Gallbladder is well distended without wall thickening or pericholecystic fluid. Pancreas: Fullness in the region of the pancreatic head is again seen and stable. Focal hypodensity is noted. Pancreatic duct is dilated but stable in appearance. Spleen: Normal in size without focal abnormality. Adrenals/Urinary Tract: Adrenal glands are within normal limits. Kidneys are well visualized bilaterally. No renal calculi or obstructive changes are seen. Bladder is decompressed. Stomach/Bowel: New rectal wall thickening and perirectal inflammatory changes are noted. These inflammatory changes extend superiorly  into the sigmoid and descending colon. Transverse colon and ascending colon appear within normal limits. The appendix is within normal limits. No small bowel or gastric abnormality is noted. Vascular/Lymphatic: Atherosclerotic calcifications are noted. No sizable lymphadenopathy is noted. Reproductive: Prostate is unremarkable. Other: No abdominal wall hernia or abnormality. No abdominopelvic ascites. Musculoskeletal: No acute or significant osseous findings. Degenerative changes are noted. IMPRESSION: New colonic wall thickening and pericolonic inflammatory changes involving the distal colon from the mid descending portion to the level of the anus. These changes are most consistent with focal colitis. No abscess or perforation is noted. Stable changes in the region of the head of the pancreas similar to that seen on prior exam. Electronically Signed   By: Inez Catalina M.D.   On: 07/13/2020 18:45   CT ABDOMEN PELVIS WO CONTRAST  Result Date: 07/06/2020 CLINICAL DATA:  Nonlocalized acute abdominal pain. Mid abdominal pain since yesterday. History of pancreatic cancer in May ongoing chemo EXAM: CT ABDOMEN AND PELVIS WITHOUT CONTRAST TECHNIQUE: Multidetector CT imaging of the abdomen and pelvis was performed following the standard protocol without IV contrast. COMPARISON:  CT angio chest 09/11/2019, CT chest 06/18/2020, CT abdomen 06/14/2020, MRI abdomen 06/15/2020 FINDINGS: Lower chest: Redemonstration of a 6 mm in 5 mm left lower lobe pulmonary nodule (2:1). Redemonstration of a 4 mm right middle lobe pulmonary nodule (2:6). Coronary artery calcifications. Hepatobiliary: No focal liver abnormality. No CT findings of calcified gallstone. No gallbladder wall thickening or pericholecystic fluid. No biliary dilatation. Pancreas: Redemonstration of 1.4 x 1 cm pancreatic head hypodensity with associated distal main pancreatic duct dilatation measuring up to at least 0.9 cm. Otherwise normal pancreatic contour. No  surrounding inflammatory changes. No main pancreatic ductal dilatation. Spleen: Normal in size without focal abnormality. Adrenals/Urinary Tract: No adrenal nodule bilaterally. No nephrolithiasis, no hydronephrosis, and no contour-deforming renal mass. No ureterolithiasis or hydroureter. The urinary bladder is unremarkable. Stomach/Bowel: Stomach is within normal limits. No evidence of bowel wall thickening or dilatation. Appendix appears normal. Vascular/Lymphatic: No abdominal aorta or iliac aneurysm. Mild atherosclerotic plaque of the aorta and its branches. No abdominal, pelvic, or inguinal lymphadenopathy. Reproductive: Prostate is unremarkable. Other: No intraperitoneal free fluid. No intraperitoneal free gas. No organized fluid collection. Musculoskeletal: No abdominal wall hernia or abnormality. No suspicious lytic or blastic osseous lesions. No acute displaced fracture. Multilevel degenerative changes of the spine. IMPRESSION: 1. Please note limited evaluation in a patient with known pancreatic cancer. Pulmonary nodules and pancreatic mass with associated main pancreatic duct dilatation appear grossly stable. 2. Otherwise no acute intra-abdominal or intrapelvic abnormality. Electronically Signed   By: Iven Finn M.D.   On: 07/06/2020 22:38     All questions were answered. The patient knows to call the clinic with any problems, questions or concerns. I spent over 30 minutes in the care of this patient including history and physical, review labs, pain regimen, treatment plan and recommendations.   [name], MD 08/05/2020 11:25 AM

## 2020-08-05 NOTE — Assessment & Plan Note (Signed)
Weight has been stable in the past 2 weeks. Will continue to monitor.

## 2020-08-05 NOTE — Assessment & Plan Note (Signed)
This is a very pleasant 65 year old male patient diagnosed with metastatic adenocarcinoma of the pancreas with lesions in the liver, possibly lung and peritoneal nodules who is here for follow-up before planned  C3 of chemotherapy. He tolerated last cycle well. No mucositis, vomiting or diarrhea. He is able to eat, maintain weight and pain is reasonably well controlled. At this time, we will continue FOLFOX, patient says he cant tolerate triplet regimen. Anticipate repeat scans after cycle 4.

## 2020-08-05 NOTE — Assessment & Plan Note (Signed)
On MS contin and oxycodone for pain control. Pain is well controlled at this time.

## 2020-08-07 ENCOUNTER — Inpatient Hospital Stay: Payer: 59

## 2020-08-07 ENCOUNTER — Other Ambulatory Visit: Payer: Self-pay

## 2020-08-07 VITALS — BP 144/82 | HR 73 | Temp 98.4°F | Resp 18

## 2020-08-07 DIAGNOSIS — C259 Malignant neoplasm of pancreas, unspecified: Secondary | ICD-10-CM

## 2020-08-07 DIAGNOSIS — Z5111 Encounter for antineoplastic chemotherapy: Secondary | ICD-10-CM | POA: Diagnosis not present

## 2020-08-07 MED ORDER — SODIUM CHLORIDE 0.9% FLUSH
10.0000 mL | INTRAVENOUS | Status: DC | PRN
  Administered 2020-08-07: 10 mL
  Filled 2020-08-07: qty 10

## 2020-08-07 MED ORDER — HEPARIN SOD (PORK) LOCK FLUSH 100 UNIT/ML IV SOLN
500.0000 [IU] | Freq: Once | INTRAVENOUS | Status: AC | PRN
  Administered 2020-08-07: 500 [IU]
  Filled 2020-08-07: qty 5

## 2020-08-09 ENCOUNTER — Other Ambulatory Visit: Payer: Self-pay | Admitting: Hematology and Oncology

## 2020-08-09 DIAGNOSIS — C259 Malignant neoplasm of pancreas, unspecified: Secondary | ICD-10-CM

## 2020-08-10 ENCOUNTER — Ambulatory Visit (INDEPENDENT_AMBULATORY_CARE_PROVIDER_SITE_OTHER): Payer: 59 | Admitting: Endocrinology

## 2020-08-10 ENCOUNTER — Other Ambulatory Visit: Payer: Self-pay

## 2020-08-10 ENCOUNTER — Encounter: Payer: Self-pay | Admitting: Endocrinology

## 2020-08-10 VITALS — BP 138/90 | HR 77 | Ht 69.0 in | Wt 176.0 lb

## 2020-08-10 DIAGNOSIS — E890 Postprocedural hypothyroidism: Secondary | ICD-10-CM

## 2020-08-10 DIAGNOSIS — E1165 Type 2 diabetes mellitus with hyperglycemia: Secondary | ICD-10-CM

## 2020-08-10 DIAGNOSIS — Z794 Long term (current) use of insulin: Secondary | ICD-10-CM

## 2020-08-10 LAB — TSH: TSH: 0.74 u[IU]/mL (ref 0.35–4.50)

## 2020-08-10 LAB — POCT GLYCOSYLATED HEMOGLOBIN (HGB A1C): Hemoglobin A1C: 8.3 % — AB (ref 4.0–5.6)

## 2020-08-10 NOTE — Patient Instructions (Addendum)
Humalog 15-20 units before meals  Lantus 18 units at night  With Chemo take upto 30 Units Humalog 4x daily

## 2020-08-10 NOTE — Progress Notes (Signed)
Patient ID: Timothy Page, male   DOB: 02-Aug-1955, 65 y.o.   MRN: [id number]            Reason for Appointment: Endocrinology follow-up    History of Present Illness:     DIABETES diagnosis date:  2014  Previous history: He had been on Metformin for most of the duration of the diabetes A1c had been usually between 6.2 and 7.2 until 08/2018  Most recent A1c is 8.3  Insulin regimen:    Lantus 10-12 units daily, Humalog 12 to 15 units 3 times daily  Recent history:     He was started on HUMALOG about 3 months ago because of high postprandial readings, highest 416 He was given specific instructions on dosage and adjustment for Humalog Also he was advised to start the freestyle libre for better assessment of his blood sugars especially postprandial Although he was told to cut back his Lantus only by 4 units he is taking only about 10 units since his discharge from the hospital over a month ago Highest blood sugar levels have been after getting Decadron for his chemotherapy done about 12 days ago  FASTING blood sugars and overnight blood sugars are consistently high and showing some rise early morning also Blood sugars in the afternoon and evening are overall fluctuating markedly He also says that he has over the last few weeks lost a total of 75 pounds His diet is fairly limited and there is eating some crackers, fruits and occasionally only chicken However he does not have any guidelines on how to adjust his blood sugars based on what he is eating more blood sugar levels Although he is entering his insulin doses in the freestyle libre unclear whether he is taking his mealtime insulin before starting to eat or only when the blood sugar goes up as most of his entries are at the height of his blood sugar patterns Also previously Jardiance and metformin were stopped because he thought he was having side effects from Jardiance  Non-insulin hypoglycemic drugs: None           Side  effects from medications:  Diarrhea from high-dose Metformin      Analysis of blood sugar patterns from freestyle libre version 2 as follows  HIGHEST blood sugars are between 4 PM-10 PM However HYPERGLYCEMIA is highly variable in the afternoons in the evenings On a few days he has had persistently high readings later in the day He has significantly high blood sugars after breakfast in the mornings Otherwise postprandial readings appear to be less prominent in the last couple of days No hypoglycemia Overnight blood sugars are relatively higher at midnight and more stable after 3 AM until 6 AM with the lowest average 176 overnight  CGM use % of time 73  2-week average/GV 216+/-37  Time in range     40   %  % Time Above 180 30  % Time above 250 30  % Time Below 70      PRE-MEAL Fasting Lunch Dinner Bedtime Overall  Glucose range:       Averages: 186 197   216   POST-MEAL PC Breakfast PC Lunch PC Dinner  Glucose range:     Averages: 235 237 275    PREVIOUS data:   PRE-MEAL Fasting Lunch Dinner Bedtime Overall  Glucose range:  84-168    173-279   Mean/median:  130    220    POST-MEAL PC Breakfast PC Lunch PC Dinner  Glucose  range:  ?   Mean/median:                    Previous nutritional counseling: None  Weight control:  Wt Readings from Last 3 Encounters:  08/10/20 176 lb (79.8 kg)  08/05/20 178 lb 14.4 oz (81.1 kg)  07/22/20 179 lb (81.2 kg)   Lab Results  Component Value Date   HGBA1C 8.3 (A) 08/10/2020   HGBA1C 7.7 (H) 06/12/2020   HGBA1C 8.5 (H) 01/29/2020   Lab Results  Component Value Date   MICROALBUR 0.7 10/30/2019   LDLCALC 103 (H) 06/15/2020   CREATININE 0.96 08/05/2020    POSTSURGICAL HYPOTHYROIDISM:  Prior to surgery he probably had a history of goiter followed by his PCP In June 2018 patient noticed significant swelling of his neck along with fever and also had cold symptoms Since he started having difficulty breathing he went to the  emergency room and was hospitalized Since he had tracheal compression he had thyroidectomy done by 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  Patient was discharged on 150 g of levothyroxine However since TSH was 0.1 this was reduced down to 137 g on his initial consultation in 7/18  RECENT history:  He is very consistent with taking his levothyroxine 137 mcg before breakfast daily with water   TSH is normal consistently   Wt Readings from Last 3 Encounters:  08/10/20 176 lb (79.8 kg)  08/05/20 178 lb 14.4 oz (81.1 kg)  07/22/20 179 lb (81.2 kg)     Lab Results  Component Value Date   TSH 0.74 08/10/2020   TSH 1.13 04/02/2020   TSH 1.35 10/30/2019   FREET4 1.17 04/02/2020   FREET4 1.26 10/30/2019   FREET4 1.20 03/01/2019     Allergies as of 08/10/2020       Reactions   Atenolol    Severe "slowing"of functioning Severe "slowing"of functioning   Hydroxyzine Other (See Comments)   "felt like I was coming out of my skin"   Tetracycline    Rash Because of a history of documented adverse serious drug reaction;Medi Alert bracelet  is recommended Rash Because of a history of documented adverse serious drug reaction;Medi Alert bracelet  is recommended   Atorvastatin    D/Ced by him due to Myalgias in Sept 2014 D/Ced by him due to Myalgias in Sept 2014   Codeine    Hallucinations. "I see pink elephants"   Guaifenesin    Other reaction(s): Other (See Comments) unknown unknown   Mucinex [guaifenesin Er] Other (See Comments)   Hallucinations "I see pink elephants"        Medication List        Accurate as of August 10, 2020  8:56 PM. If you have any questions, ask your nurse or doctor.          dexamethasone 4 MG tablet Commonly known as: DECADRON Take 2 tablets by mouth 2 (two) times daily.   FreeStyle Libre 2 Sensor Misc 2 Devices by Does not apply route every 14 (fourteen) days.   HumaLOG KwikPen 100 UNIT/ML KwikPen Generic drug:  insulin lispro Inject 10-15 Units into the skin daily.   HYDROmorphone 2 MG tablet Commonly known as: Dilaudid Take 1 tablet (2 mg total) by mouth every 4 (four) hours as needed for moderate pain or severe pain.   Insulin Pen Needle 31G X 5 MM Misc Use with insulin pen 3 times a day   lactulose 10 g packet Commonly  known as: CEPHULAC Take 10 g by mouth daily.   Lantus SoloStar 100 UNIT/ML Solostar Pen Generic drug: insulin glargine Give up to 10 units under the skin every evening at bedtime. What changed:  how much to take how to take this when to take this additional instructions   levothyroxine 137 MCG tablet Commonly known as: SYNTHROID TAKE 1 TABLET BY MOUTH  DAILY BEFORE BREAKFAST   lipase/protease/amylase 36000 UNITS Cpep capsule Commonly known as: Creon Take 2 capsules (72,000 Units total) by mouth 3 (three) times daily with meals. May also take 1 capsule (36,000 Units total) as needed (with snacks).   LORazepam 0.5 MG tablet Commonly known as: Ativan Take 1 tablet (0.5 mg total) by mouth every 6 (six) hours as needed for anxiety.   magic mouthwash Soln SWISH WITH 30 MLS BY MOUTH FOUR TIMES DAILY AND SWALLOW. NO FOOD OR DRINK FOR 1 HOUR AFTERWARDS   morphine 15 MG 12 hr tablet Commonly known as: MS CONTIN Take 1 tablet (15 mg total) by mouth every 12 (twelve) hours. For cancer pain   omeprazole 20 MG capsule Commonly known as: PRILOSEC Take 1 capsule (20 mg total) by mouth daily.   ondansetron 8 MG tablet Commonly known as: Zofran Take 1 tablet (8 mg total) by mouth 2 (two) times daily as needed. Start on day 3 after chemotherapy.   OneTouch Delica Plus [id number] Misc USE TO CHECK BLOOD SUGAR  ONCE DAILY   OneTouch Ultra test strip Generic drug: glucose blood USE TO CHECK BLOOD SUGAR  ONCE DAILY   prochlorperazine 10 MG tablet Commonly known as: COMPAZINE Take 1 tablet (10 mg total) by mouth every 6 (six) hours as needed for nausea or vomiting.    senna-docusate 8.6-50 MG tablet Commonly known as: Senokot-S Take 1 tablet by mouth 2 (two) times daily.        Allergies:  Allergies  Allergen Reactions   Atenolol     Severe "slowing"of functioning Severe "slowing"of functioning   Hydroxyzine Other (See Comments)    "felt like I was coming out of my skin"   Tetracycline     Rash Because of a history of documented adverse serious drug reaction;Medi Alert bracelet  is recommended Rash Because of a history of documented adverse serious drug reaction;Medi Alert bracelet  is recommended   Atorvastatin     D/Ced by him due to Myalgias in Sept 2014 D/Ced by him due to Myalgias in Sept 2014   Codeine     Hallucinations. "I see pink elephants"   Guaifenesin     Other reaction(s): Other (See Comments) unknown unknown   Mucinex [Guaifenesin Er] Other (See Comments)    Hallucinations "I see pink elephants"    Past Medical History:  Diagnosis Date   Arthritis    Atypical chest pain 04/2017   ACS ruled out 05/11/17   CAP (community acquired pneumonia) 05/07/2017   Diabetes mellitus without complication ([place])    Poor control starting fall 2021-->GAD ab neg.  Insulin and C peptide levels wnl but on lower end of normal->Dr Kuma/endo 2022.   Exocrine pancreatic insufficiency 2022   History of thyroidectomy, total 2018   Hurlthe cell adenoma   Hx of adenomatous polyp of colon 07/02/2009   rpt colonoscopy 2026   Hyperlipidemia    Intol of multiple statins and zetia. [facility] 2022.   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 Cammy Copa   Right ureteral calculus 04/2018   Cystoscopy with right retrograde  pyelogram and right ureteral stent placement after stone removal.   Seasonal allergies    Stroke ([place])    in 55Y no complications    Tachyarrhythmia 1980   age 51 X 2 over 45 month period.  No recurrence since that time.   Thoracic aortic aneurysm ([facility])    Incidentally discovered, 4.1 cm-->Dr. Cyndia Bent. Rpt CT 08/2018 and 08/2019 stable.   Vocal cord paralysis 07/2016   Left vocal cord paralysis noted after thyroid surgery.    Past Surgical History:  Procedure Laterality Date   BIOPSY  06/18/2020   Procedure: BIOPSY;  Surgeon: Rush Landmark Telford Nab., MD;  Location: [facility] ENDOSCOPY;  Service: Gastroenterology;;   [place] W/ POLYPECTOMY  07/02/2009; 01/24/20   2011 adenoma.  2021 adenomas x 3->recall 2026   CYSTOSCOPY WITH RETROGRADE PYELOGRAM, URETEROSCOPY AND STENT PLACEMENT Right 05/09/2018   Procedure: CYSTOSCOPY WITH RIGHT RETROGRADE RIGHT URETEROSCOPY STONE EXTRACTION RIGHT STENT EXCHANGE;  Surgeon: Lucas Mallow, MD;  Location: [facility] ORS;  Service: Urology;  Laterality: Right;   CYSTOSCOPY/RETROGRADE/URETEROSCOPY/STONE EXTRACTION WITH BASKET Right 04/30/2018   Procedure: CYSTOSCOPY/RETROGRADE/RIGHT URETEROSCOPY/;  Surgeon: Lucas Mallow, MD;  Location: [facility] ORS;  Service: Urology;  Laterality: Right;   ESOPHAGOGASTRODUODENOSCOPY  07/02/2009   NORMAL   ESOPHAGOGASTRODUODENOSCOPY (EGD) WITH PROPOFOL N/A 06/18/2020   Procedure: ESOPHAGOGASTRODUODENOSCOPY (EGD) WITH PROPOFOL;  Surgeon: Rush Landmark Telford Nab., MD;  Location: [place];  Service: Gastroenterology;  Laterality: N/A;   EYE SURGERY     FINE NEEDLE ASPIRATION  06/18/2020   Procedure: FINE NEEDLE ASPIRATION (FNA) LINEAR;  Surgeon: Irving Copas., MD;  Location: [place];  Service: Gastroenterology;;   finger amputaion     with reattachement   INCISION AND DRAINAGE ABSCESS ANAL  11/23/2019   gen surg (In ED).   IR IMAGING GUIDED PORT INSERTION  06/22/2020   LASIK     THYROIDECTOMY  N/A 08/03/2016   Procedure: Total THYROIDECTOMY;  Surgeon: [name], MD;  Location: [place];  Service: ENT;  Laterality: N/A;  Total Thyroidectomy    UPPER ESOPHAGEAL ENDOSCOPIC ULTRASOUND (EUS) N/A 06/18/2020   Procedure: UPPER ESOPHAGEAL ENDOSCOPIC ULTRASOUND (EUS);  Surgeon: Irving Copas., MD;  Location: [place];  Service: Gastroenterology;  Laterality: N/A;   UPPER GASTROINTESTINAL ENDOSCOPY     with EUS and bx of pancreatic mass. +appearance of candida in esoph.      Family History  Problem Relation Age of Onset   Diabetes Mother    Cancer Mother        lung   COPD Mother    Brain cancer Mother    Arthritis Mother    Hyperlipidemia Mother    Heart disease Mother    Hypertension Mother    Diabetes Father    Cancer Father        mesothelioma; skin   Mental illness Father    Arthritis Father    Hyperlipidemia Father    Heart disease Father    Hypertension Father    Heart disease Maternal Grandmother        MI @ 79   Heart disease Maternal Grandfather  MI @ 4   Diabetes Paternal Grandmother    Dementia Paternal Grandmother    Heart disease Paternal Grandfather        MI @ 70   Colon cancer Neg Hx    Esophageal cancer Neg Hx    Rectal cancer Neg Hx    Stomach cancer Neg Hx     Social History:  reports that he has never smoked. His smokeless tobacco use includes snuff. He reports that he does not drink alcohol and does not use drugs.    Review of Systems  HYPERTENSION: He is not on any medications currently, previously on Claritin Also monitors at home, previously managed by PCP  BP Readings from Last 3 Encounters:  08/10/20 138/90  08/07/20 (!) 144/82  08/05/20 (!) 152/88   Has started chemotherapy for metastatic prostate cancer    Examination:   BP 138/90   Pulse 77   Ht 5\' 9"  (1.753 m)   Wt 176 lb (79.8 kg)   SpO2 98%   BMI 25.99 kg/m      Assessment/Plan:   DIABETES type II  He is currently on a basal bolus  insulin  His A1c is 8.3  Despite his significant weight loss he appears to be insulin-dependent now Not clear if this is related to his pancreatic carcinoma However part of his hyperglycemia is related to Decadron for chemotherapy  Agree with oncologist that his blood sugar targets need to be relaxed and we will try to get his blood sugars at a reasonable level without significant fluctuation him  Recommendations: He can go up to 20 units on Humalog especially when he is eating a full meal and for his main meal in the evening Avoid high sugar fruits like watermelon and grapes He will try to take this with every meal   LANTUS will need to be titrated to get his morning sugars down to about 130 Given him a flowsheet with detailed instructions on how to do this by 2 units every 3 days Timing and action of the rapid acting insulin was discussed He will make sure he takes his insulin when he starts eating and not wait till he has a high blood sugar  Also he will try to get some protein with every meal Likely needs up to 30 units Humalog 4 times a day when he has blood sugars rising after his chemotherapy along with increase of 6 to 10 units on Lantus for 2 to 3 days, this was discussed in detail He will try to check his sugars consistently with the freestyle libre to allow more complete evaluation  Follow-up in 4 to 6 weeks Consider consultation with diabetes educator to help him adjust insulin further  Hypothyroidism: Since he has lost significant amount of weight will recheck his TSH, currently on 137 mcg levothyroxine  Patient Instructions  Humalog 15-20 units before meals  Lantus 18 units at night  With Chemo take upto 30 Units Humalog 4x daily   Elayne Snare 08/10/2020  Addendum: TSH normal

## 2020-08-11 ENCOUNTER — Other Ambulatory Visit: Payer: 59

## 2020-08-11 ENCOUNTER — Ambulatory Visit: Payer: 59

## 2020-08-12 ENCOUNTER — Other Ambulatory Visit: Payer: Self-pay

## 2020-08-12 DIAGNOSIS — C259 Malignant neoplasm of pancreas, unspecified: Secondary | ICD-10-CM

## 2020-08-12 MED ORDER — MORPHINE SULFATE ER 15 MG PO TBCR: 15.0000 mg | EXTENDED_RELEASE_TABLET | Freq: Two times a day (BID) | ORAL | 0 refills | Status: DC

## 2020-08-18 ENCOUNTER — Other Ambulatory Visit: Payer: Self-pay

## 2020-08-18 ENCOUNTER — Telehealth: Payer: Self-pay | Admitting: Endocrinology

## 2020-08-18 DIAGNOSIS — E1165 Type 2 diabetes mellitus with hyperglycemia: Secondary | ICD-10-CM

## 2020-08-18 DIAGNOSIS — Z794 Long term (current) use of insulin: Secondary | ICD-10-CM

## 2020-08-18 MED ORDER — FREESTYLE LIBRE 2 SENSOR MISC: 2.0000 | 3 refills | Status: DC

## 2020-08-18 NOTE — Telephone Encounter (Signed)
Rx sent to preferred pharmacy.

## 2020-08-18 NOTE — Telephone Encounter (Signed)
MEDICATION: Free Style Libre 2 - Sensors  PHARMACY:  [facility] on [address]  HAS THE PATIENT CONTACTED THEIR PHARMACY? yes    IS THIS A 90 DAY SUPPLY : yes   IS PATIENT OUT OF MEDICATION: yes  IF NOT; HOW MUCH IS LEFT:    LAST APPOINTMENT DATE: @6 /27/2022  NEXT APPOINTMENT DATE:@8 /19/2022  DO WE HAVE YOUR PERMISSION TO LEAVE A DETAILED MESSAGE?: yes

## 2020-08-19 ENCOUNTER — Other Ambulatory Visit: Payer: Self-pay | Admitting: Hematology and Oncology

## 2020-08-19 MED ORDER — OXYCODONE HCL 5 MG PO TABS: 5.0000 mg | ORAL_TABLET | Freq: Four times a day (QID) | ORAL | 0 refills | Status: DC | PRN

## 2020-08-19 NOTE — Progress Notes (Signed)
Prescription for oxycodone sent to pharmacy of his choice. Dilaudid discontinued.

## 2020-08-20 ENCOUNTER — Inpatient Hospital Stay: Payer: 59 | Admitting: Nutrition

## 2020-08-20 ENCOUNTER — Other Ambulatory Visit: Payer: Self-pay

## 2020-08-20 ENCOUNTER — Inpatient Hospital Stay (HOSPITAL_BASED_OUTPATIENT_CLINIC_OR_DEPARTMENT_OTHER): Payer: 59 | Admitting: Hematology and Oncology

## 2020-08-20 ENCOUNTER — Inpatient Hospital Stay: Payer: 59 | Attending: Genetic Counselor

## 2020-08-20 ENCOUNTER — Encounter: Payer: Self-pay | Admitting: Hematology and Oncology

## 2020-08-20 ENCOUNTER — Other Ambulatory Visit: Payer: 59

## 2020-08-20 ENCOUNTER — Inpatient Hospital Stay: Payer: 59

## 2020-08-20 VITALS — BP 163/83 | HR 73

## 2020-08-20 VITALS — BP 181/93 | HR 68 | Temp 98.6°F | Resp 18 | Ht 69.0 in | Wt 177.4 lb

## 2020-08-20 DIAGNOSIS — Z7901 Long term (current) use of anticoagulants: Secondary | ICD-10-CM | POA: Insufficient documentation

## 2020-08-20 DIAGNOSIS — C259 Malignant neoplasm of pancreas, unspecified: Secondary | ICD-10-CM | POA: Diagnosis not present

## 2020-08-20 DIAGNOSIS — K5909 Other constipation: Secondary | ICD-10-CM | POA: Insufficient documentation

## 2020-08-20 DIAGNOSIS — F418 Other specified anxiety disorders: Secondary | ICD-10-CM | POA: Diagnosis not present

## 2020-08-20 DIAGNOSIS — G893 Neoplasm related pain (acute) (chronic): Secondary | ICD-10-CM

## 2020-08-20 DIAGNOSIS — I10 Essential (primary) hypertension: Secondary | ICD-10-CM | POA: Insufficient documentation

## 2020-08-20 DIAGNOSIS — Z452 Encounter for adjustment and management of vascular access device: Secondary | ICD-10-CM | POA: Insufficient documentation

## 2020-08-20 DIAGNOSIS — Z5111 Encounter for antineoplastic chemotherapy: Secondary | ICD-10-CM | POA: Diagnosis not present

## 2020-08-20 DIAGNOSIS — I81 Portal vein thrombosis: Secondary | ICD-10-CM | POA: Diagnosis not present

## 2020-08-20 DIAGNOSIS — C787 Secondary malignant neoplasm of liver and intrahepatic bile duct: Secondary | ICD-10-CM | POA: Insufficient documentation

## 2020-08-20 DIAGNOSIS — Z95828 Presence of other vascular implants and grafts: Secondary | ICD-10-CM

## 2020-08-20 LAB — CBC WITH DIFFERENTIAL (CANCER CENTER ONLY)
Abs Immature Granulocytes: 0.01 10*3/uL (ref 0.00–0.07)
Basophils Absolute: 0.1 10*3/uL (ref 0.0–0.1)
Basophils Relative: 1 %
Eosinophils Absolute: 0.3 10*3/uL (ref 0.0–0.5)
Eosinophils Relative: 7 %
HCT: 32.3 % — ABNORMAL LOW (ref 39.0–52.0)
Hemoglobin: 11.3 g/dL — ABNORMAL LOW (ref 13.0–17.0)
Immature Granulocytes: 0 %
Lymphocytes Relative: 23 %
Lymphs Abs: 1 10*3/uL (ref 0.7–4.0)
MCH: 31.2 pg (ref 26.0–34.0)
MCHC: 35 g/dL (ref 30.0–36.0)
MCV: 89.2 fL (ref 80.0–100.0)
Monocytes Absolute: 0.4 10*3/uL (ref 0.1–1.0)
Monocytes Relative: 9 %
Neutro Abs: 2.7 10*3/uL (ref 1.7–7.7)
Neutrophils Relative %: 60 %
Platelet Count: 136 10*3/uL — ABNORMAL LOW (ref 150–400)
RBC: 3.62 MIL/uL — ABNORMAL LOW (ref 4.22–5.81)
RDW: 15.9 % — ABNORMAL HIGH (ref 11.5–15.5)
WBC Count: 4.4 10*3/uL (ref 4.0–10.5)
nRBC: 0 % (ref 0.0–0.2)

## 2020-08-20 LAB — CMP (CANCER CENTER ONLY)
ALT: 9 U/L (ref 0–44)
AST: 9 U/L — ABNORMAL LOW (ref 15–41)
Albumin: 3.3 g/dL — ABNORMAL LOW (ref 3.5–5.0)
Alkaline Phosphatase: 75 U/L (ref 38–126)
Anion gap: 9 (ref 5–15)
BUN: 11 mg/dL (ref 8–23)
CO2: 25 mmol/L (ref 22–32)
Calcium: 8.8 mg/dL — ABNORMAL LOW (ref 8.9–10.3)
Chloride: 107 mmol/L (ref 98–111)
Creatinine: 1.03 mg/dL (ref 0.61–1.24)
GFR, Estimated: >60 mL/min (ref >60)
Glucose, Bld: 232 mg/dL — ABNORMAL HIGH (ref 70–99)
Potassium: 3.2 mmol/L — ABNORMAL LOW (ref 3.5–5.1)
Sodium: 141 mmol/L (ref 135–145)
Total Bilirubin: 0.5 mg/dL (ref 0.3–1.2)
Total Protein: 6.2 g/dL — ABNORMAL LOW (ref 6.5–8.1)

## 2020-08-20 MED ORDER — FLUOROURACIL CHEMO INJECTION 2.5 GM/50ML
400.0000 mg/m2 | Freq: Once | INTRAVENOUS | Status: DC
Start: 2020-08-20 — End: 2020-08-20

## 2020-08-20 MED ORDER — PALONOSETRON HCL INJECTION 0.25 MG/5ML
INTRAVENOUS | Status: AC
  Filled 2020-08-20: qty 5

## 2020-08-20 MED ORDER — LORAZEPAM 1 MG PO TABS
ORAL_TABLET | ORAL | Status: AC
  Filled 2020-08-20: qty 1

## 2020-08-20 MED ORDER — SODIUM CHLORIDE 0.9 % IV SOLN
2475.0000 mg/m2 | INTRAVENOUS | Status: DC
  Administered 2020-08-20: 5000 mg via INTRAVENOUS
  Filled 2020-08-20: qty 100

## 2020-08-20 MED ORDER — LORAZEPAM 1 MG PO TABS
0.5000 mg | ORAL_TABLET | Freq: Once | ORAL | Status: AC
Start: 2020-08-20 — End: 2020-08-20
  Administered 2020-08-20: 0.5 mg via ORAL

## 2020-08-20 MED ORDER — HYDRALAZINE HCL 20 MG/ML IJ SOLN
10.0000 mg | Freq: Once | INTRAMUSCULAR | Status: AC
Start: 2020-08-20 — End: 2020-08-20
  Administered 2020-08-20: 10 mg via INTRAVENOUS
  Filled 2020-08-20: qty 0.5

## 2020-08-20 MED ORDER — SODIUM CHLORIDE 0.9 % IV SOLN
10.0000 mg | Freq: Once | INTRAVENOUS | Status: AC
  Administered 2020-08-20: 10 mg via INTRAVENOUS
  Filled 2020-08-20: qty 10

## 2020-08-20 MED ORDER — FLUOROURACIL CHEMO INJECTION 2.5 GM/50ML
400.0000 mg/m2 | Freq: Once | INTRAVENOUS | Status: AC
  Administered 2020-08-20: 800 mg via INTRAVENOUS
  Filled 2020-08-20: qty 16

## 2020-08-20 MED ORDER — OXALIPLATIN CHEMO INJECTION 100 MG/20ML
60.0000 mg/m2 | Freq: Once | INTRAVENOUS | Status: AC
  Administered 2020-08-20: 120 mg via INTRAVENOUS
  Filled 2020-08-20: qty 20

## 2020-08-20 MED ORDER — SODIUM CHLORIDE 0.9 % IV SOLN
400.0000 mg/m2 | Freq: Once | INTRAVENOUS | Status: AC
  Administered 2020-08-20: 812 mg via INTRAVENOUS
  Filled 2020-08-20: qty 40.6

## 2020-08-20 MED ORDER — SODIUM CHLORIDE 0.9% FLUSH
10.0000 mL | Freq: Once | INTRAVENOUS | Status: AC
  Administered 2020-08-20: 10 mL
  Filled 2020-08-20: qty 10

## 2020-08-20 MED ORDER — SODIUM CHLORIDE 0.9 % IV SOLN
150.0000 mg | Freq: Once | INTRAVENOUS | Status: AC
  Administered 2020-08-20: 150 mg via INTRAVENOUS
  Filled 2020-08-20: qty 150

## 2020-08-20 MED ORDER — HYDRALAZINE HCL 20 MG/ML IJ SOLN
10.0000 mg | Freq: Once | INTRAMUSCULAR | Status: DC
  Filled 2020-08-20: qty 0.5

## 2020-08-20 MED ORDER — SODIUM CHLORIDE 0.9% FLUSH
10.0000 mL | INTRAVENOUS | Status: DC | PRN
  Filled 2020-08-20: qty 10

## 2020-08-20 MED ORDER — HEPARIN SOD (PORK) LOCK FLUSH 100 UNIT/ML IV SOLN
500.0000 [IU] | Freq: Once | INTRAVENOUS | Status: DC | PRN
  Filled 2020-08-20: qty 5

## 2020-08-20 MED ORDER — LORAZEPAM 0.5 MG PO TABS: 0.5000 mg | ORAL_TABLET | Freq: Two times a day (BID) | ORAL | 0 refills | Status: DC | PRN

## 2020-08-20 MED ORDER — DEXTROSE 5 % IV SOLN
Freq: Once | INTRAVENOUS | Status: AC
  Filled 2020-08-20: qty 250

## 2020-08-20 MED ORDER — PALONOSETRON HCL INJECTION 0.25 MG/5ML
0.2500 mg | Freq: Once | INTRAVENOUS | Status: AC
  Administered 2020-08-20: 0.25 mg via INTRAVENOUS

## 2020-08-20 NOTE — Progress Notes (Signed)
Nutrition follow up completed with patient during infusion for pancreas cancer.  Patient feels much better. He is eating smaller meals and snacks and that has made him feel much better.  He experienced cold sensitivity for ~5 days after last treatment. Patient reports nausea improves with nausea medication. He has constipation but is working on a bowel regimen. Overall patient reports he is much better nutritionally.  Weight is stable at 177.4 pounds July 7 from 178.9 pounds June 22. Labs noted: Potassium 3.2, glucose 232, albumin 3.3 on July 7.  Nutrition diagnosis: Unintentional weight loss stable.  Intervention: Educated patient to continue same strategies for adequate calories and protein throughout the day. Add oral nutrition supplements as needed. Continue bowel regimen with plenty of fluid.  Monitoring, evaluation, goals: Patient will tolerate adequate calories and protein for weight maintenance.  Next visit: To be scheduled as needed.  **Disclaimer: This note was dictated with voice recognition software. Similar sounding words can inadvertently be transcribed and this note may contain transcription errors which may not have been corrected upon publication of note.**

## 2020-08-20 NOTE — Progress Notes (Signed)
[name] FOLLOW UP NOTE  Patient Care Team: Timothy Pike, MD as PCP - General (Hematology and Oncology) Timothy Mayer, MD as Consulting Physician (Gastroenterology) Timothy Gala, MD as Consulting Physician (Otolaryngology) Timothy Snare, MD as Consulting Physician (Endocrinology) Timothy Pollack, MD as Consulting Physician (Cardiothoracic Surgery) Timothy Mallow, MD as Consulting Physician (Urology) Timothy Pickett Nadean Corwin, MD as Consulting Physician (Cardiology) Timothy Pike, MD as Consulting Physician (Hematology and Oncology) Timothy Finner, RN (Inactive) as Oncology Nurse Navigator  CHIEF COMPLAINTS/PURPOSE OF CONSULTATION:  Pancreatic cancer, follow up prior to 4th cycle of FOLFIRINOX  ASSESSMENT & PLAN:   Adenocarcinoma of pancreas, stage 4 ([place]) This is a very pleasant 65 year old male patient diagnosed with metastatic adenocarcinoma of the pancreas with lesions in the liver, possibly lung and peritoneal nodules who is here for follow-up before planned  C4 of chemotherapy. He has been tolerating FOLFOX very well.  Since last visit, review of systems pertinent for mild pain controlled with oxycodone once a day, some fatigue but significantly improved.  No concern for mucositis or diarrhea. Physical examination mild tenderness in the left upper quadrant with some guarding but no rigidity or rebound tenderness. I have reviewed his labs, okay to proceed with treatment as planned however his blood pressure was quite uncontrolled this morning hence we will try as needed hydralazine as well as some Ativan to relax him and once blood pressure is better controlled, okay to proceed with treatment.  Anxiety about health He is currently continuing on Ativan twice a day for management of anxiety.  We will refill this today.  Cancer related pain Pain has been very well controlled compared to last visit.  He has quit taking morphine.  He is currently taking oxycodone 1 a  day.  He is reluctant to try Dilaudid.  Oxycodone prescription has been refilled recently.  No orders of the defined types were placed in this encounter.   HISTORY OF PRESENTING ILLNESS:   Timothy Page 64 y.o. male is here because of new diagnosis of pancreatic cancer.  Oncology History  Adenocarcinoma of pancreas, stage 4 ([place])  06/20/2020 Initial Diagnosis   Adenocarcinoma of pancreas, stage 4 ([place])    07/01/2020 -  Chemotherapy    Patient is on Treatment Plan: PANCREAS MODIFIED FOLFIRINOX Q14D X 4 CYCLES       07/01/2020 Genetic Testing   Negative genetic testing on a custom panel reviewing multicancer and pancreatitis genes.  The Custom-Gene Panel offered by [facility] includes sequencing and/or deletion duplication testing of the following 91 genes: AIP, ALK, APC*, ATM*, AXIN2, BAP1, BARD1, BLM, BMPR1A, BRCA1, BRCA2, BRIP1, CASR, CDC73, CDH1, CDK4, CDKN1B, CDKN1C, CDKN2A (p14ARF), CDKN2A (p16INK4a), CEBPA, CFTR*, CHEK2, CPA1, CTNNA1, CTRC, DICER1*, DIS3L2, EGFR, EPCAM*, FANCC, FH*, FLCN, GATA2, GPC3*, GREM1*, HOXB13, HRAS, KIT, MAX*, MEN1*, MET*, MITF*, MLH1*, MSH2*, MSH3*, MSH6*, MUTYH, NBN, NF1*, NF2, NTHL1, PALB2, PALLD, PDGFRA, PHOX2B*, PMS2*, POLD1*, POLE, POT1, PRKAR1A, PRSS1*, PTCH1, PTEN*, RAD50, RAD51C, RAD51D, RB1*, RECQL4*, RET, RUNX1, SDHA*, SDHAF2, SDHB, SDHC*, SDHD, SMAD4, SMARCA4, SMARCB1, SMARCE1, SPINK1, STK11, SUFU, TERC, TERT, TMEM127, TP53, TSC1*, TSC2, VHL, WRN*, and WT1.  The report date is Jul 01, 2020.    INTERIM HISTORY  Mr. Timothy Page is here for a follow-up in preparation for cycle 4 He is tolerating current regimen well.  Pain is well controlled, taking one oxycodone a day He is eating well, lost about 2 lbs since last visit Energy is much better, went to play golf last weekend. No nausea,  vomiting, diarrhea. No neuropathy. Rest of the pertinent 10 point ROS reviewed and neg.  MEDICAL HISTORY:  Past Medical History:  Diagnosis Date   Arthritis     Atypical chest pain 04/2017   ACS ruled out 05/11/17   CAP (community acquired pneumonia) 05/07/2017   Diabetes mellitus without complication ([place])    Poor control starting fall 2021-->GAD ab neg.  Insulin and C peptide levels wnl but on lower end of normal->Timothy Page/endo 2022.   Exocrine pancreatic insufficiency 2022   History of thyroidectomy, total 2018   Hurlthe cell adenoma   Hx of adenomatous polyp of colon 07/02/2009   rpt colonoscopy 2026   Hyperlipidemia    Intol of multiple statins and zetia. [facility] lipid clinic 2022.   Hypertension    Lipoma    back   Myocardial infarction [facility])    MI at age 35   Pancreatic adenocarcinoma ([place]) 06/2020   Mets to liver (?and lungs?->stage 4), w/acute pancreatitis.   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 Cammy Page   Right ureteral calculus 04/2018   Cystoscopy with right retrograde pyelogram and right ureteral stent placement after stone removal.   Seasonal allergies    Stroke ([place])    in 30Z no complications    Tachyarrhythmia 1980   age 22 X 2 over 54 month period.  No recurrence since that time.   Thoracic aortic aneurysm (H[facility])    Incidentally discovered, 4.1 cm-->Timothy Page. Rpt CT 08/2018 and 08/2019 stable.   Vocal cord paralysis 07/2016   Left vocal cord paralysis noted after thyroid surgery.    SURGICAL HISTORY: Past Surgical History:  Procedure Laterality Date   BIOPSY  06/18/2020   Procedure: BIOPSY;  Surgeon: Mansouraty, Telford Nab., MD;  Location: [facility] ENDOSCOPY;  Service: Gastroenterology;;   [place] W/ POLYPECTOMY  07/02/2009; 01/24/20   2011 adenoma.  2021 adenomas x 3->recall 2026   CYSTOSCOPY WITH RETROGRADE PYELOGRAM, URETEROSCOPY AND STENT PLACEMENT Right 05/09/2018   Procedure: CYSTOSCOPY WITH RIGHT RETROGRADE RIGHT URETEROSCOPY STONE EXTRACTION RIGHT STENT  EXCHANGE;  Surgeon: Timothy Mallow, MD;  Location: [facility] ORS;  Service: Urology;  Laterality: Right;   CYSTOSCOPY/RETROGRADE/URETEROSCOPY/STONE EXTRACTION WITH BASKET Right 04/30/2018   Procedure: CYSTOSCOPY/RETROGRADE/RIGHT URETEROSCOPY/;  Surgeon: Timothy Mallow, MD;  Location: [facility] ORS;  Service: Urology;  Laterality: Right;   ESOPHAGOGASTRODUODENOSCOPY  07/02/2009   NORMAL   ESOPHAGOGASTRODUODENOSCOPY (EGD) WITH PROPOFOL N/A 06/18/2020   Procedure: ESOPHAGOGASTRODUODENOSCOPY (EGD) WITH PROPOFOL;  Surgeon: Rush Landmark Telford Nab., MD;  Location: [facility];  Service: Gastroenterology;  Laterality: N/A;   EYE SURGERY     FINE NEEDLE ASPIRATION  06/18/2020   Procedure: FINE NEEDLE ASPIRATION (FNA) LINEAR;  Surgeon: Irving Copas., MD;  Location: [facility];  Service: Gastroenterology;;   finger amputaion     with reattachement   INCISION AND DRAINAGE ABSCESS ANAL  11/23/2019   gen surg (In ED).   IR IMAGING GUIDED PORT INSERTION  06/22/2020   LASIK     THYROIDECTOMY N/A 08/03/2016   Procedure: Total THYROIDECTOMY;  Surgeon: Timothy Gala, MD;  Location: [facility];  Service: ENT;  Laterality: N/A;  Total Thyroidectomy    UPPER ESOPHAGEAL ENDOSCOPIC ULTRASOUND (EUS) N/A 06/18/2020   Procedure: UPPER ESOPHAGEAL ENDOSCOPIC ULTRASOUND (EUS);  Surgeon: Irving Copas., MD;  Location: [facility];  Service: Gastroenterology;  Laterality: N/A;   UPPER  GASTROINTESTINAL ENDOSCOPY     with EUS and bx of pancreatic mass. +appearance of candida in esoph.      SOCIAL HISTORY: Social History   Socioeconomic History   Marital status: Married    Spouse name: Not on file   Number of children: Not on file   Years of education: Not on file   Highest education level: Not on file  Occupational History   Not on file  Tobacco Use   Smoking status: Never   Smokeless tobacco: Current    Types: Snuff  Vaping Use   Vaping Use: Never used  Substance and Sexual Activity   Alcohol use: No    Drug use: No   Sexual activity: Not on file  Other Topics Concern   Not on file  Social History Narrative   Married, 2 daughters, 1 grandson that lives with him.   Educ: some college   Occup: retired from the [employer].  Retired at 9 but returned to work at some point, now with grandson living with him.   No T/A/Ds.   Social Determinants of Health   Financial Resource Strain: Not on file  Food Insecurity: Not on file  Transportation Needs: Not on file  Physical Activity: Not on file  Stress: Not on file  Social Connections: Not on file  Intimate Partner Violence: Not on file    FAMILY HISTORY: Family History  Problem Relation Age of Onset   Diabetes Mother    Cancer Mother        lung   COPD Mother    Brain cancer Mother    Arthritis Mother    Hyperlipidemia Mother    Heart disease Mother    Hypertension Mother    Diabetes Father    Cancer Father        mesothelioma; skin   Mental illness Father    Arthritis Father    Hyperlipidemia Father    Heart disease Father    Hypertension Father    Heart disease Maternal Grandmother        MI @ 72   Heart disease Maternal Grandfather        MI @ 1   Diabetes Paternal Grandmother    Dementia Paternal Grandmother    Heart disease Paternal Grandfather        MI @ 63   Colon cancer Neg Hx    Esophageal cancer Neg Hx    Rectal cancer Neg Hx    Stomach cancer Neg Hx     ALLERGIES:  is allergic to atenolol, hydroxyzine, tetracycline, atorvastatin, codeine, guaifenesin, and mucinex [guaifenesin er].  MEDICATIONS:  Current Outpatient Medications  Medication Sig Dispense Refill   Continuous Blood Gluc Sensor (FREESTYLE LIBRE 2 SENSOR) MISC 2 Devices by Does not apply route every 14 (fourteen) days. 2 each 3   dexamethasone (DECADRON) 4 MG tablet Take 2 tablets by mouth 2 (two) times daily.     HUMALOG KWIKPEN 100 UNIT/ML KwikPen Inject 10-15 Units into the skin daily.     insulin glargine (LANTUS SOLOSTAR) 100  UNIT/ML Solostar Pen Give up to 10 units under the skin every evening at bedtime. (Patient taking differently: Inject 20 Units into the skin at bedtime.) 15 mL 1   Insulin Pen Needle 31G X 5 MM MISC Use with insulin pen 3 times a day 100 each 1   lactulose (CEPHULAC) 10 g packet Take 10 g by mouth daily.     Lancets (ONETOUCH DELICA PLUS [id number]) MISC USE TO  CHECK BLOOD SUGAR  ONCE DAILY 100 each 3   levothyroxine (SYNTHROID) 137 MCG tablet TAKE 1 TABLET BY MOUTH  DAILY BEFORE BREAKFAST (Patient taking differently: Take 137 mcg by mouth daily before breakfast.) 90 tablet 3   lipase/protease/amylase (CREON) 36000 UNITS CPEP capsule Take 2 capsules (72,000 Units total) by mouth 3 (three) times daily with meals. May also take 1 capsule (36,000 Units total) as needed (with snacks). 240 capsule 11   LORazepam (ATIVAN) 0.5 MG tablet Take 1 tablet (0.5 mg total) by mouth every 6 (six) hours as needed for anxiety. 30 tablet 0   magic mouthwash SOLN SWISH WITH 30 MLS BY MOUTH FOUR TIMES DAILY AND SWALLOW. NO FOOD OR DRINK FOR 1 HOUR AFTERWARDS     omeprazole (PRILOSEC) 20 MG capsule Take 1 capsule (20 mg total) by mouth daily. 30 capsule 0   ondansetron (ZOFRAN) 8 MG tablet Take 1 tablet (8 mg total) by mouth 2 (two) times daily as needed. Start on day 3 after chemotherapy. 30 tablet 1   ONETOUCH ULTRA test strip USE TO CHECK BLOOD SUGAR  ONCE DAILY 100 strip 3   oxyCODONE (OXY IR/ROXICODONE) 5 MG immediate release tablet Take 1 tablet (5 mg total) by mouth every 6 (six) hours as needed for severe pain or moderate pain. 60 tablet 0   prochlorperazine (COMPAZINE) 10 MG tablet Take 1 tablet (10 mg total) by mouth every 6 (six) hours as needed for nausea or vomiting. 30 tablet 1   senna-docusate (SENOKOT-S) 8.6-50 MG tablet Take 1 tablet by mouth 2 (two) times daily.     morphine (MS CONTIN) 15 MG 12 hr tablet Take 1 tablet (15 mg total) by mouth every 12 (twelve) hours. For cancer pain 60 tablet 0   No  current facility-administered medications for this visit.   Facility-Administered Medications Ordered in Other Visits  Medication Dose Route Frequency Provider Last Rate Last Admin   fluorouracil (ADRUCIL) 5,000 mg in sodium chloride 0.9 % 150 mL chemo infusion  2,475 mg/m2 (Treatment Plan Recorded) Intravenous 1 day or 1 dose Terree Gaultney, MD       fluorouracil (ADRUCIL) chemo injection 800 mg  400 mg/m2 (Treatment Plan Recorded) Intravenous Once Johnnie Goynes, Arletha Pili, MD       heparin lock flush 100 unit/mL  500 Units Intracatheter Once PRN Mirza Kidney, Arletha Pili, MD       hydrALAZINE (APRESOLINE) injection 10 mg  10 mg Intravenous Once Darianna Amy, Arletha Pili, MD       leucovorin 812 mg in sodium chloride 0.9 % 250 mL infusion  400 mg/m2 (Treatment Plan Recorded) Intravenous Once Ajai Harville, Arletha Pili, MD       oxaliplatin (ELOXATIN) 120 mg in dextrose 5 % 500 mL chemo infusion  60 mg/m2 (Treatment Plan Recorded) Intravenous Once Bronwen Pendergraft, MD       sodium chloride flush (NS) 0.9 % injection 10 mL  10 mL Intracatheter PRN Rocio Roam, MD        PHYSICAL EXAMINATION: ECOG PERFORMANCE STATUS: 1 - Symptomatic but completely ambulatory  Vitals:   08/20/20 1010  BP: (!) 181/93  Pulse: 68  Resp: 18  Temp: 98.6 F (37 C)  SpO2: 100%   Filed Weights   08/20/20 1010  Weight: 177 lb 6.4 oz (80.5 kg)    Physical Exam Constitutional:      General: He is not in acute distress.    Appearance: Normal appearance.  HENT:     Head: Normocephalic and atraumatic.  Cardiovascular:  Rate and Rhythm: Normal rate and regular rhythm.     Pulses: Normal pulses.     Heart sounds: Normal heart sounds.  Pulmonary:     Effort: Pulmonary effort is normal.     Breath sounds: Normal breath sounds.  Abdominal:     General: Abdomen is flat. Bowel sounds are normal.     Palpations: Abdomen is soft.     Tenderness: There is abdominal tenderness (LUQ mild, no rigidity or rebound tenderness). There is no right CVA  tenderness or guarding.  Musculoskeletal:        General: Normal range of motion.     Cervical back: Normal range of motion and neck supple.  Lymphadenopathy:     Cervical: No cervical adenopathy.  Skin:    General: Skin is warm and dry.  Neurological:     General: No focal deficit present.     Mental Status: He is alert.  Psychiatric:        Mood and Affect: Mood normal.        Behavior: Behavior normal.    LABORATORY DATA:  I have reviewed the data as listed Lab Results  Component Value Date   WBC 4.4 08/20/2020   HGB 11.3 (L) 08/20/2020   HCT 32.3 (L) 08/20/2020   MCV 89.2 08/20/2020   PLT 136 (L) 08/20/2020     Chemistry      Component Value Date/Time   NA 141 08/20/2020 0941   K 3.2 (L) 08/20/2020 0941   CL 107 08/20/2020 0941   CO2 25 08/20/2020 0941   BUN 11 08/20/2020 0941   CREATININE 1.03 08/20/2020 0941   CREATININE 1.11 11/03/2017 1533      Component Value Date/Time   CALCIUM 8.8 (L) 08/20/2020 0941   ALKPHOS 75 08/20/2020 0941   AST 9 (L) 08/20/2020 0941   ALT 9 08/20/2020 0941   BILITOT 0.5 08/20/2020 0941      RADIOGRAPHIC STUDIES: I have personally reviewed the radiological images as listed and agreed with the findings in the report. No results found.   All questions were answered. The patient knows to call the clinic with any problems, questions or concerns. I spent over 30 minutes in the care of this patient including history and physical, review labs, pain regimen, treatment plan and recommendations.    Timothy Pike, MD 08/20/2020 12:27 PM

## 2020-08-20 NOTE — Progress Notes (Signed)
R Dr. Chryl Heck ok to get started with treatment with elevated BP, will recheck again in an hour.  Per Dr. Chryl Heck ok to proceed with treatment with BP 163/83, pt. denies complaints of chest pain, dizziness, and no SOB noted.

## 2020-08-20 NOTE — Patient Instructions (Addendum)
[facility] ONCOLOGY  Discharge Instructions: Thank you for choosing [facility] to provide your oncology and hematology care.   If you have a lab appointment with the [facility], please go directly to the [facility] and check in at the registration area.   Wear comfortable clothing and clothing appropriate for easy access to any Portacath or PICC line.   We strive to give you quality time with your provider. You may need to reschedule your appointment if you arrive late (15 or more minutes).  Arriving late affects you and other patients whose appointments are after yours.  Also, if you miss three or more appointments without notifying the office, you may be dismissed from the clinic at the provider's discretion.      For prescription refill requests, have your pharmacy contact our office and allow 72 hours for refills to be completed.    Today you received the following chemotherapy and/or immunotherapy agents: Oxaliplatin, Leucovorin, and Fluorouracil.    To help prevent nausea and vomiting after your treatment, we encourage you to take your nausea medication as directed.  BELOW ARE SYMPTOMS THAT SHOULD BE REPORTED IMMEDIATELY: *FEVER GREATER THAN 100.4 F (38 C) OR HIGHER *CHILLS OR SWEATING *NAUSEA AND VOMITING THAT IS NOT CONTROLLED WITH YOUR NAUSEA MEDICATION *UNUSUAL SHORTNESS OF BREATH *UNUSUAL BRUISING OR BLEEDING *URINARY PROBLEMS (pain or burning when urinating, or frequent urination) *BOWEL PROBLEMS (unusual diarrhea, constipation, pain near the anus) TENDERNESS IN MOUTH AND THROAT WITH OR WITHOUT PRESENCE OF ULCERS (sore throat, sores in mouth, or a toothache) UNUSUAL RASH, SWELLING OR PAIN  UNUSUAL VAGINAL DISCHARGE OR ITCHING   Items with * indicate a potential emergency and should be followed up as soon as possible or go to the Emergency Department if any problems should occur.  Please show the CHEMOTHERAPY ALERT CARD or  IMMUNOTHERAPY ALERT CARD at check-in to the Emergency Department and triage nurse.  Should you have questions after your visit or need to cancel or reschedule your appointment, please contact [facility]  Dept: [phone]  and follow the prompts.  Office hours are 8:00 a.m. to 4:30 p.m. Monday - Friday. Please note that voicemails left after 4:00 p.m. may not be returned until the following business day.  We are closed weekends and major holidays. You have access to a nurse at all times for urgent questions. Please call the main number to the clinic Dept: [phone] and follow the prompts.   For any non-urgent questions, you may also contact your provider using MyChart. We now offer e-Visits for anyone 30 and older to request care online for non-urgent symptoms. For details visit [email or web address].   Also download the MyChart app! Go to the app store, search "MyChart", open the app, select [facility], and log in with your MyChart username and password.  Due to Covid, a mask is required upon entering the hospital/clinic. If you do not have a mask, one will be given to you upon arrival. For doctor visits, patients may have 1 support person aged 38 or older with them. For treatment visits, patients cannot have anyone with them due to current Covid guidelines and our immunocompromised population.   [facility] Discharge Instructions for Patients receiving Home Portable Chemo Pump    **The bag should finish at 46 hours, 96 hours or 7 days. For example, if your pump is scheduled for 46 hours and it was put on at 4pm, it should finish  at 2 pm the day it is scheduled to come off regardless of your appointment time.    Estimated time to finish   _________________________ (Have your nurse fill in)     ** if the display on your pump reads "Low Volume" and it is beeping, take the batteries out of the pump and come to the cancer center for it to be taken  off.   **If the pump alarms go off prior to the pump reading "Low Volume" then call the [phone] and someone can assist you.  **If the plunger comes out and the bag fluid is running out, please use your chemo spill kit to clean up the spill. Do not use paper towels or other house hold products.  ** If you have problems or questions regarding your pump, please call either the [phone] or the cancer center Monday-Friday 8:00am-4:30pm at [phone] and we will assist you.  If you are unable to get assistance then go to [facility] Emergency Room, ask the staff to contact the IV team for assistance.    Hydralazine Injection What is this medication? HYDRALAZINE (hye DRAL a zeen) treats high blood pressure. It works by relaxing blood vessels, which decreases blood pressure and the amount of work the [employer] to do. This medicine may be used for other purposes; ask your health care provider orpharmacist if you have questions. What should I tell my care team before I take this medication? They need to know if you have any of these conditions: Blood vessel disease Heart disease including angina or history of heart attack Kidney disease Liver disease Systemic lupus erythematosus (SLE) An unusual or allergic reaction to hydralazine, tartrazine dye, other medications, foods, dyes, or preservatives Pregnant or trying to get pregnant Breast-feeding How should I use this medication? This medication is injected into a vein or a muscle. It is given in a hospitalor clinic. Talk to your care team about the use of this medication in children. Specialcare may be needed. Overdosage: If you think you have taken too much of this medicine contact [place] control center or emergency room at once. NOTE: This medicine is only for you. Do not share this medicine with others. What if I miss a dose? This does not apply. This medication is not for regular use. What may interact with this  medication? Medications for high blood pressure Medications for depression This list may not describe all possible interactions. Give your health care provider a list of all the medicines, herbs, non-prescription drugs, or dietary supplements you use. Also tell them if you smoke, drink alcohol, or use illegaldrugs. Some items may interact with your medicine. What should I watch for while using this medication? You may get drowsy or dizzy. Do not drive, use machinery, or do anything that needs mental alertness until you know how this medication affects you. Do not stand or sit up quickly, especially if you are an older patient. This reduces the risk of dizzy or fainting spells. Alcohol may interfere with the effect ofthis medication. Avoid alcoholic drinks. Do not treat yourself for coughs, colds, or pain while you are taking this medication without asking your care team for advice. Some ingredients mayincrease your blood pressure. What side effects may I notice from receiving this medication? Side effects that you should report to your care team as soon as possible: Allergic reactions-skin rash, itching, hives, swelling of the face, lips, tongue, or throat Heart attack-pain or tightness in the chest, shoulders, arms, or jaw, nausea,  shortness of breath, cold or clammy skin, feeling faint or lightheaded Low blood pressure-dizziness, feeling faint or lightheaded, blurry vision Lupus-like syndrome-joint pain, swelling, or stiffness, butterfly-shaped rash on the face, rashes that get worse in the sun, fever, unusual weakness or fatigue Pain, tingling, or numbness in the hands or feet Side effects that usually do not require medical attention (report to your careteam if they continue or are bothersome): Diarrhea Headache Heart palpitations-rapid, pounding, or irregular heartbeat Loss of appetite Nausea Vomiting This list may not describe all possible side effects. Call your doctor for medical advice  about side effects. You may report side effects to FDA at1-800-FDA-1088. Where should I keep my medication? This medication is given in a hospital or clinic. It will not be stored at home. NOTE: This sheet is a summary. It may not cover all possible information. If you have questions about this medicine, talk to your doctor, pharmacist, orhealth care provider.  2022 Elsevier/Gold Standard (2020-03-04 11:24:23)

## 2020-08-20 NOTE — Assessment & Plan Note (Signed)
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 He has been tolerating FOLFOX very well.  Since last visit, review of systems pertinent for mild pain controlled with oxycodone once a day, some fatigue but significantly improved.  No concern for mucositis or diarrhea. Physical examination mild tenderness in the left upper quadrant with some guarding but no rigidity or rebound tenderness. I have reviewed his labs, okay to proceed with treatment as planned however his blood pressure was quite uncontrolled this morning hence we will try as needed hydralazine as well as some Ativan to relax him and once blood pressure is better controlled, okay to proceed with treatment.

## 2020-08-20 NOTE — Assessment & Plan Note (Signed)
Pain has been very well controlled compared to last visit.  He has quit taking morphine.  He is currently taking oxycodone 1 a day.  He is reluctant to try Dilaudid.  Oxycodone prescription has been refilled recently.

## 2020-08-20 NOTE — Assessment & Plan Note (Signed)
He is currently continuing on Ativan twice a day for management of anxiety.  We will refill this today.

## 2020-08-21 LAB — CANCER ANTIGEN 19-9: CA 19-9: 37 U/mL — ABNORMAL HIGH (ref 0–35)

## 2020-08-22 ENCOUNTER — Other Ambulatory Visit: Payer: Self-pay

## 2020-08-22 ENCOUNTER — Inpatient Hospital Stay: Payer: 59

## 2020-08-22 VITALS — BP 157/109 | HR 83 | Temp 97.9°F | Resp 17 | Ht 69.0 in

## 2020-08-22 DIAGNOSIS — Z5111 Encounter for antineoplastic chemotherapy: Secondary | ICD-10-CM | POA: Diagnosis not present

## 2020-08-22 DIAGNOSIS — C259 Malignant neoplasm of pancreas, unspecified: Secondary | ICD-10-CM

## 2020-08-22 MED ORDER — HEPARIN SOD (PORK) LOCK FLUSH 100 UNIT/ML IV SOLN
500.0000 [IU] | Freq: Once | INTRAVENOUS | Status: AC | PRN
Start: 2020-08-22 — End: 2020-08-22
  Administered 2020-08-22: 500 [IU]
  Filled 2020-08-22: qty 5

## 2020-08-22 MED ORDER — SODIUM CHLORIDE 0.9% FLUSH
10.0000 mL | INTRAVENOUS | Status: DC | PRN
  Administered 2020-08-22: 10 mL
  Filled 2020-08-22: qty 10

## 2020-08-26 ENCOUNTER — Ambulatory Visit (HOSPITAL_COMMUNITY)
Admission: RE | Admit: 2020-08-26 | Discharge: 2020-08-26 | Disposition: A | Discharge status: Home / Self Care | Payer: 59 | Source: Ambulatory Visit | Attending: Hematology and Oncology | Admitting: Hematology and Oncology

## 2020-08-26 ENCOUNTER — Other Ambulatory Visit: Payer: Self-pay

## 2020-08-26 DIAGNOSIS — C259 Malignant neoplasm of pancreas, unspecified: Secondary | ICD-10-CM | POA: Insufficient documentation

## 2020-08-26 MED ORDER — IOHEXOL 350 MG/ML SOLN
100.0000 mL | Freq: Once | INTRAVENOUS | Status: AC | PRN
  Administered 2020-08-26: 100 mL via INTRAVENOUS

## 2020-08-26 MED ORDER — SODIUM CHLORIDE (PF) 0.9 % IJ SOLN
INTRAMUSCULAR | Status: AC
  Filled 2020-08-26: qty 50

## 2020-08-27 ENCOUNTER — Telehealth: Payer: Self-pay

## 2020-08-27 ENCOUNTER — Other Ambulatory Visit: Payer: Self-pay | Admitting: Hematology and Oncology

## 2020-08-27 MED ORDER — LACTULOSE 10 GM/15ML PO SOLN: 10.0000 g | Freq: Three times a day (TID) | ORAL | 0 refills | Status: DC | PRN

## 2020-08-27 NOTE — Telephone Encounter (Signed)
Returned call regarding request for medication to help relieve constipation. LVM that prescription for lactulose had been sent over to preferred pharmacy.

## 2020-08-31 ENCOUNTER — Inpatient Hospital Stay (HOSPITAL_BASED_OUTPATIENT_CLINIC_OR_DEPARTMENT_OTHER): Payer: 59 | Admitting: Hematology and Oncology

## 2020-08-31 ENCOUNTER — Encounter: Payer: Self-pay | Admitting: Hematology and Oncology

## 2020-08-31 ENCOUNTER — Inpatient Hospital Stay: Payer: 59

## 2020-08-31 ENCOUNTER — Other Ambulatory Visit: Payer: Self-pay

## 2020-08-31 ENCOUNTER — Other Ambulatory Visit: Payer: 59

## 2020-08-31 VITALS — BP 150/87 | HR 76 | Temp 98.2°F | Resp 18 | Wt 179.5 lb

## 2020-08-31 DIAGNOSIS — C259 Malignant neoplasm of pancreas, unspecified: Secondary | ICD-10-CM | POA: Diagnosis not present

## 2020-08-31 DIAGNOSIS — I10 Essential (primary) hypertension: Secondary | ICD-10-CM | POA: Diagnosis not present

## 2020-08-31 DIAGNOSIS — K5903 Drug induced constipation: Secondary | ICD-10-CM | POA: Diagnosis not present

## 2020-08-31 DIAGNOSIS — F418 Other specified anxiety disorders: Secondary | ICD-10-CM

## 2020-08-31 DIAGNOSIS — I81 Portal vein thrombosis: Secondary | ICD-10-CM

## 2020-08-31 DIAGNOSIS — Z95828 Presence of other vascular implants and grafts: Secondary | ICD-10-CM

## 2020-08-31 DIAGNOSIS — Z5111 Encounter for antineoplastic chemotherapy: Secondary | ICD-10-CM | POA: Diagnosis not present

## 2020-08-31 LAB — CBC WITH DIFFERENTIAL (CANCER CENTER ONLY)
Abs Immature Granulocytes: 0.01 10*3/uL (ref 0.00–0.07)
Basophils Absolute: 0 10*3/uL (ref 0.0–0.1)
Basophils Relative: 1 %
Eosinophils Absolute: 0.3 10*3/uL (ref 0.0–0.5)
Eosinophils Relative: 7 %
HCT: 33.3 % — ABNORMAL LOW (ref 39.0–52.0)
Hemoglobin: 11.8 g/dL — ABNORMAL LOW (ref 13.0–17.0)
Immature Granulocytes: 0 %
Lymphocytes Relative: 21 %
Lymphs Abs: 0.9 10*3/uL (ref 0.7–4.0)
MCH: 31.9 pg (ref 26.0–34.0)
MCHC: 35.4 g/dL (ref 30.0–36.0)
MCV: 90 fL (ref 80.0–100.0)
Monocytes Absolute: 0.3 10*3/uL (ref 0.1–1.0)
Monocytes Relative: 7 %
Neutro Abs: 2.8 10*3/uL (ref 1.7–7.7)
Neutrophils Relative %: 64 %
Platelet Count: 131 10*3/uL — ABNORMAL LOW (ref 150–400)
RBC: 3.7 MIL/uL — ABNORMAL LOW (ref 4.22–5.81)
RDW: 16 % — ABNORMAL HIGH (ref 11.5–15.5)
WBC Count: 4.4 10*3/uL (ref 4.0–10.5)
nRBC: 0 % (ref 0.0–0.2)

## 2020-08-31 LAB — CMP (CANCER CENTER ONLY)
ALT: 10 U/L (ref 0–44)
AST: 12 U/L — ABNORMAL LOW (ref 15–41)
Albumin: 3.5 g/dL (ref 3.5–5.0)
Alkaline Phosphatase: 76 U/L (ref 38–126)
Anion gap: 9 (ref 5–15)
BUN: 9 mg/dL (ref 8–23)
CO2: 25 mmol/L (ref 22–32)
Calcium: 8.8 mg/dL — ABNORMAL LOW (ref 8.9–10.3)
Chloride: 107 mmol/L (ref 98–111)
Creatinine: 0.9 mg/dL (ref 0.61–1.24)
GFR, Estimated: >60 mL/min (ref >60)
Glucose, Bld: 161 mg/dL — ABNORMAL HIGH (ref 70–99)
Potassium: 3.8 mmol/L (ref 3.5–5.1)
Sodium: 141 mmol/L (ref 135–145)
Total Bilirubin: 0.5 mg/dL (ref 0.3–1.2)
Total Protein: 6.4 g/dL — ABNORMAL LOW (ref 6.5–8.1)

## 2020-08-31 MED ORDER — OXALIPLATIN CHEMO INJECTION 100 MG/20ML
60.0000 mg/m2 | Freq: Once | INTRAVENOUS | Status: AC
  Administered 2020-08-31: 120 mg via INTRAVENOUS
  Filled 2020-08-31: qty 20

## 2020-08-31 MED ORDER — LORAZEPAM 0.5 MG PO TABS: 0.5000 mg | ORAL_TABLET | Freq: Two times a day (BID) | ORAL | 0 refills | Status: DC | PRN

## 2020-08-31 MED ORDER — PALONOSETRON HCL INJECTION 0.25 MG/5ML
0.2500 mg | Freq: Once | INTRAVENOUS | Status: AC
  Administered 2020-08-31: 0.25 mg via INTRAVENOUS

## 2020-08-31 MED ORDER — DEXTROSE 5 % IV SOLN
Freq: Once | INTRAVENOUS | Status: AC
  Filled 2020-08-31: qty 250

## 2020-08-31 MED ORDER — MAGNESIUM CITRATE PO SOLN: 0.5000 | Freq: Once | ORAL | 0 refills | Status: AC

## 2020-08-31 MED ORDER — PALONOSETRON HCL INJECTION 0.25 MG/5ML
INTRAVENOUS | Status: AC
  Filled 2020-08-31: qty 5

## 2020-08-31 MED ORDER — LEUCOVORIN CALCIUM INJECTION 350 MG
400.0000 mg/m2 | Freq: Once | INTRAVENOUS | Status: AC
  Administered 2020-08-31: 812 mg via INTRAVENOUS
  Filled 2020-08-31: qty 40.6

## 2020-08-31 MED ORDER — SODIUM CHLORIDE 0.9 % IV SOLN
2451.0000 mg/m2 | INTRAVENOUS | Status: DC
  Administered 2020-08-31: 5000 mg via INTRAVENOUS
  Filled 2020-08-31: qty 100

## 2020-08-31 MED ORDER — SODIUM CHLORIDE 0.9% FLUSH
10.0000 mL | Freq: Once | INTRAVENOUS | Status: AC
  Administered 2020-08-31: 10 mL
  Filled 2020-08-31: qty 10

## 2020-08-31 MED ORDER — APIXABAN 5 MG PO TABS: 5.0000 mg | ORAL_TABLET | Freq: Two times a day (BID) | ORAL | 1 refills | Status: DC

## 2020-08-31 MED ORDER — FLUOROURACIL CHEMO INJECTION 2.5 GM/50ML
400.0000 mg/m2 | Freq: Once | INTRAVENOUS | Status: AC
  Administered 2020-08-31: 800 mg via INTRAVENOUS
  Filled 2020-08-31: qty 16

## 2020-08-31 MED ORDER — SODIUM CHLORIDE 0.9 % IV SOLN
150.0000 mg | Freq: Once | INTRAVENOUS | Status: AC
  Administered 2020-08-31: 150 mg via INTRAVENOUS
  Filled 2020-08-31: qty 150

## 2020-08-31 MED ORDER — HYDRALAZINE HCL 20 MG/ML IJ SOLN
10.0000 mg | Freq: Once | INTRAMUSCULAR | Status: AC
  Administered 2020-08-31: 10 mg via INTRAVENOUS
  Filled 2020-08-31: qty 0.5

## 2020-08-31 MED ORDER — HYDRALAZINE HCL 20 MG/ML IJ SOLN: 10.0000 mg | Freq: Once | INTRAMUSCULAR | Status: DC

## 2020-08-31 MED ORDER — SODIUM CHLORIDE 0.9 % IV SOLN
10.0000 mg | Freq: Once | INTRAVENOUS | Status: AC
  Administered 2020-08-31: 10 mg via INTRAVENOUS
  Filled 2020-08-31: qty 10

## 2020-08-31 MED ORDER — OMEPRAZOLE 20 MG PO CPDR: 20.0000 mg | DELAYED_RELEASE_CAPSULE | Freq: Every day | ORAL | 0 refills | Status: DC

## 2020-08-31 NOTE — Assessment & Plan Note (Signed)
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  Have discussed with Dr. Polly Cobia who suggested this is likely a bland thrombus but a very small one.  Given hypercoagulable state from underlying metastatic pancreatic cancer and known thrombus, have started him on Eliquis for anticoagulation, no need to load. Have discussed about increased risk of bleeding with anticoagulation on board.  He was instructed to go to the nearest hospital with intractable headache or unstoppable bleed.

## 2020-08-31 NOTE — Assessment & Plan Note (Signed)
Drug-induced constipation.  He has been trying to take Colace, MiraLAX and lactulose.  He had a small bowel movement yesterday but still feels uncomfortable.  Have recommended him to take Colace and MiraLAX daily unless he has diarrhea and will try mag citrate today, prescription sent to preferred pharmacy of choice.

## 2020-08-31 NOTE — Progress Notes (Signed)
Ok to start premeds. Dr Chryl Heck is ordering IV hydralazine. Please wait to start chemo until BP comes down to ~435 systolic

## 2020-08-31 NOTE — Progress Notes (Signed)
[facility] FOLLOW UP NOTE  Patient Care Team: [name], MD as PCP - General (Hematology and Oncology) Gatha Mayer, MD as Consulting Physician (Gastroenterology) [name], MD as Consulting Physician (Otolaryngology) Elayne Snare, MD as Consulting Physician (Endocrinology) Gaye Pollack, MD as Consulting Physician (Cardiothoracic Surgery) Lucas Mallow, MD as Consulting Physician (Urology) Debara Pickett Nadean Corwin, MD as Consulting Physician (Cardiology) [name], MD as Consulting Physician (Hematology and Oncology) Jonnie Finner, RN (Inactive) as Oncology Nurse Navigator  CHIEF COMPLAINTS/PURPOSE OF CONSULTATION:  Pancreatic cancer, follow up prior to 4th cycle of FOLFIRINOX  ASSESSMENT & PLAN:   Adenocarcinoma of pancreas, stage 4 ([place]) This is a very pleasant 65 year old male patient with pancreatic adenocarcinoma, stage IV on FOLFOX chemotherapy status post 4 cycles of chemotherapy and most recent imaging showing stable disease who is here for cycle 5 of planned chemotherapy.  Since his last visit, his pain is better controlled, no nausea or vomiting or diarrhea or mucositis.  He is very bothered by his constipation and has been using lactulose, Colace and MiraLAX. Okay to proceed with chemotherapy as planned if blood pressure is better controlled.  We had to give him 1 dose of hydralazine since his SBP was 180 today. He is agreeable to proceed with the recommendations.  Essential hypertension In the past I have recommended him to discontinue clonidine and to consider another form of antihypertensive given some drug drug interaction.  He apparently has not started on any other antihypertensive.  His blood pressure has been repeatedly high on sequential measurements hence I have asked him to go back on ramipril which is his antihypertensive medication. We also had to give him a dose of hydralazine to bring his blood pressure down today.  He  understands and is agreeable.  He knows that very high blood pressures can lead to increased risk of heart attacks and strokes.  Drug induced constipation Drug-induced constipation.  He has been trying to take Colace, MiraLAX and lactulose.  He had a small bowel movement yesterday but still feels uncomfortable.  Have recommended him to take Colace and MiraLAX daily unless he has diarrhea and will try mag citrate today, prescription sent to preferred pharmacy of choice.  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  Have discussed with Dr. Polly Cobia who suggested this is likely a bland thrombus but a very small one.  Given hypercoagulable state from underlying metastatic pancreatic cancer and known thrombus, have started him on Eliquis for anticoagulation, no need to load. Have discussed about increased risk of bleeding with anticoagulation on board.  He was instructed to go to the nearest hospital with intractable headache or unstoppable bleed.  Anxiety about health He is currently using lorazepam twice a day for management of anxiety.  We will continue this.  No orders of the defined types were placed in this encounter.   HISTORY OF PRESENTING ILLNESS:   Timothy VANDEVOORT 65 y.o. male is here because of new diagnosis of pancreatic cancer.  Oncology History  Adenocarcinoma of pancreas, stage 4 ([place])  06/20/2020 Initial Diagnosis   Adenocarcinoma of pancreas, stage 4 ([place])    07/01/2020 -  Chemotherapy    Patient is on Treatment Plan: PANCREAS MODIFIED FOLFIRINOX Q14D X 4 CYCLES       07/01/2020 Genetic Testing   Negative genetic testing on a custom panel reviewing multicancer and pancreatitis genes.  The Custom-Gene Panel offered by [facility] includes sequencing and/or deletion duplication testing of  the following 91 genes: AIP, ALK, APC*, ATM*, AXIN2, BAP1, BARD1, BLM, BMPR1A, BRCA1, BRCA2, BRIP1, CASR, CDC73, CDH1, CDK4, CDKN1B, CDKN1C, CDKN2A (p14ARF), CDKN2A  (p16INK4a), CEBPA, CFTR*, CHEK2, CPA1, CTNNA1, CTRC, DICER1*, DIS3L2, EGFR, EPCAM*, FANCC, FH*, FLCN, GATA2, GPC3*, GREM1*, HOXB13, HRAS, KIT, MAX*, MEN1*, MET*, MITF*, MLH1*, MSH2*, MSH3*, MSH6*, MUTYH, NBN, NF1*, NF2, NTHL1, PALB2, PALLD, PDGFRA, PHOX2B*, PMS2*, POLD1*, POLE, POT1, PRKAR1A, PRSS1*, PTCH1, PTEN*, RAD50, RAD51C, RAD51D, RB1*, RECQL4*, RET, RUNX1, SDHA*, SDHAF2, SDHB, SDHC*, SDHD, SMAD4, SMARCA4, SMARCB1, SMARCE1, SPINK1, STK11, SUFU, TERC, TERT, TMEM127, TP53, TSC1*, TSC2, VHL, WRN*, and WT1.  The report date is Jul 01, 2020.    Imaging   Imaging after 4 cycles of therapy  IMPRESSION: 1. Pancreatic neck mass is stable. 2. No new or progressive metastatic disease. Tiny soft tissue nodule along the posteroinferior right liver capsule is stable. Solitary liver metastasis adjacent to the gallbladder is mildly decreased. Small bilateral pulmonary nodules are all stable. 3. New small nonocclusive thrombosis in the main portal vein. Persistent near occlusion of the proximal main portal vein with narrowing of the portal splenic venous confluence. 4. Aortic Atherosclerosis (ICD10-I70.0).    INTERIM HISTORY  Mr. Huberty is here for a follow-up before cycle 5. He was seen in the infusion today.  He complains of constipation and straining to have a bowel movement.  He has been trying to take Colace, MiraLAX and lactulose which has not been very helpful.  He has been taking pain medicines very infrequently, in the last week to 2 tablets of oxycodone. He denies any worsening abdominal pain to warrant pain medicine every day. No nausea or vomiting.  He is passing gas. No diarrhea or mucositis. No change in breathing.  He has not restarted on any other blood pressure medications since he stopped clonidine.  In the past he was recommended to touch base with his PCP to change antihypertensives but he has not done this.  He is using the glucose monitor to monitor his hyperglycemia. Rest of the  pertinent 10 point ROS reviewed and negative.  MEDICAL HISTORY:  Past Medical History:  Diagnosis Date   Arthritis    Atypical chest pain 04/2017   ACS ruled out 05/11/17   CAP (community acquired pneumonia) 05/07/2017   Diabetes mellitus without complication ([place])    Poor control starting fall 2021-->GAD ab neg.  Insulin and C peptide levels wnl but on lower end of normal->Dr Kuma/endo 2022.   Exocrine pancreatic insufficiency 2022   History of thyroidectomy, total 2018   Hurlthe cell adenoma   Hx of adenomatous polyp of colon 07/02/2009   rpt colonoscopy 2026   Hyperlipidemia    Intol of multiple statins and zetia. [facility] lipid clinic 2022.   Hypertension    Lipoma    back   Myocardial infarction [facility])    MI at age 21   Pancreatic adenocarcinoma ([place]) 06/2020   Mets to liver (?and lungs?->stage 4), w/acute pancreatitis.   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 Cammy Copa   Right ureteral calculus 04/2018   Cystoscopy with right retrograde pyelogram and right ureteral stent placement after stone removal.   Seasonal allergies    Stroke ([place])    in 79M no complications    Tachyarrhythmia 1980   age 36 X 2 over 66 month period.  No recurrence since that time.   Thoracic aortic aneurysm ([place])  Incidentally discovered, 4.1 cm-->Dr. Cyndia Bent. Rpt CT 08/2018 and 08/2019 stable.   Vocal cord paralysis 07/2016   Left vocal cord paralysis noted after thyroid surgery.    SURGICAL HISTORY: Past Surgical History:  Procedure Laterality Date   BIOPSY  06/18/2020   Procedure: BIOPSY;  Surgeon: Mansouraty, Telford Nab., MD;  Location: [facility] ENDOSCOPY;  Service: Gastroenterology;;   [place] W/ POLYPECTOMY  07/02/2009; 01/24/20   2011 adenoma.  2021 adenomas x 3->recall 2026   CYSTOSCOPY WITH RETROGRADE PYELOGRAM, URETEROSCOPY AND  STENT PLACEMENT Right 05/09/2018   Procedure: CYSTOSCOPY WITH RIGHT RETROGRADE RIGHT URETEROSCOPY STONE EXTRACTION RIGHT STENT EXCHANGE;  Surgeon: Lucas Mallow, MD;  Location: [facility] ORS;  Service: Urology;  Laterality: Right;   CYSTOSCOPY/RETROGRADE/URETEROSCOPY/STONE EXTRACTION WITH BASKET Right 04/30/2018   Procedure: CYSTOSCOPY/RETROGRADE/RIGHT URETEROSCOPY/;  Surgeon: Lucas Mallow, MD;  Location: [facility] ORS;  Service: Urology;  Laterality: Right;   ESOPHAGOGASTRODUODENOSCOPY  07/02/2009   NORMAL   ESOPHAGOGASTRODUODENOSCOPY (EGD) WITH PROPOFOL N/A 06/18/2020   Procedure: ESOPHAGOGASTRODUODENOSCOPY (EGD) WITH PROPOFOL;  Surgeon: Rush Landmark Telford Nab., MD;  Location: [place];  Service: Gastroenterology;  Laterality: N/A;   EYE SURGERY     FINE NEEDLE ASPIRATION  06/18/2020   Procedure: FINE NEEDLE ASPIRATION (FNA) LINEAR;  Surgeon: Irving Copas., MD;  Location: [place];  Service: Gastroenterology;;   finger amputaion     with reattachement   INCISION AND DRAINAGE ABSCESS ANAL  11/23/2019   gen surg (In ED).   IR IMAGING GUIDED PORT INSERTION  06/22/2020   LASIK     THYROIDECTOMY N/A 08/03/2016   Procedure: Total THYROIDECTOMY;  Surgeon: [name], MD;  Location: [place];  Service: ENT;  Laterality: N/A;  Total Thyroidectomy    UPPER ESOPHAGEAL ENDOSCOPIC ULTRASOUND (EUS) N/A 06/18/2020   Procedure: UPPER ESOPHAGEAL ENDOSCOPIC ULTRASOUND (EUS);  Surgeon: Irving Copas., MD;  Location: [place];  Service: Gastroenterology;  Laterality: N/A;   UPPER GASTROINTESTINAL ENDOSCOPY     with EUS and bx of pancreatic mass. +appearance of candida in esoph.      SOCIAL HISTORY: Social History   Socioeconomic History   Marital status: Married    Spouse name: Not on file   Number of children: Not on file   Years of education: Not on file   Highest education level: Not on file  Occupational History   Not on file  Tobacco Use   Smoking status: Never   Smokeless  tobacco: Current    Types: Snuff  Vaping Use   Vaping Use: Never used  Substance and Sexual Activity   Alcohol use: No   Drug use: No   Sexual activity: Not on file  Other Topics Concern   Not on file  Social History Narrative   Married, 2 daughters, 1 grandson that lives with him.   Educ: some college   Occup: retired from the [employer].  Retired at 26 but returned to work at some point, now with grandson living with him.   No T/A/Ds.   Social Determinants of Health   Financial Resource Strain: Not on file  Food Insecurity: Not on file  Transportation Needs: Not on file  Physical Activity: Not on file  Stress: Not on file  Social Connections: Not on file  Intimate Partner Violence: Not on file    FAMILY HISTORY: Family History  Problem Relation Age of Onset   Diabetes Mother    Cancer Mother        lung  COPD Mother    Brain cancer Mother    Arthritis Mother    Hyperlipidemia Mother    Heart disease Mother    Hypertension Mother    Diabetes Father    Cancer Father        mesothelioma; skin   Mental illness Father    Arthritis Father    Hyperlipidemia Father    Heart disease Father    Hypertension Father    Heart disease Maternal Grandmother        MI @ 38   Heart disease Maternal Grandfather        MI @ 50   Diabetes Paternal Grandmother    Dementia Paternal Grandmother    Heart disease Paternal Grandfather        MI @ 6   Colon cancer Neg Hx    Esophageal cancer Neg Hx    Rectal cancer Neg Hx    Stomach cancer Neg Hx     ALLERGIES:  is allergic to atenolol, hydroxyzine, tetracycline, atorvastatin, codeine, guaifenesin, and mucinex [guaifenesin er].  MEDICATIONS:  Current Outpatient Medications  Medication Sig Dispense Refill   apixaban (ELIQUIS) 5 MG TABS tablet Take 1 tablet (5 mg total) by mouth 2 (two) times daily. 60 tablet 1   magnesium citrate SOLN Take 148 mLs (0.5 Bottles total) by mouth once for 1 dose. 195 mL 0   Continuous Blood  Gluc Sensor (FREESTYLE LIBRE 2 SENSOR) MISC 2 Devices by Does not apply route every 14 (fourteen) days. 2 each 3   dexamethasone (DECADRON) 4 MG tablet Take 2 tablets by mouth 2 (two) times daily.     HUMALOG KWIKPEN 100 UNIT/ML KwikPen Inject 10-15 Units into the skin daily.     insulin glargine (LANTUS SOLOSTAR) 100 UNIT/ML Solostar Pen Give up to 10 units under the skin every evening at bedtime. (Patient taking differently: Inject 20 Units into the skin at bedtime.) 15 mL 1   Insulin Pen Needle 31G X 5 MM MISC Use with insulin pen 3 times a day 100 each 1   lactulose (CHRONULAC) 10 GM/15ML solution Take 15 mLs (10 g total) by mouth 3 (three) times daily as needed for mild constipation. 236 mL 0   levothyroxine (SYNTHROID) 137 MCG tablet TAKE 1 TABLET BY MOUTH  DAILY BEFORE BREAKFAST (Patient taking differently: Take 137 mcg by mouth daily before breakfast.) 90 tablet 3   lipase/protease/amylase (CREON) 36000 UNITS CPEP capsule Take 2 capsules (72,000 Units total) by mouth 3 (three) times daily with meals. May also take 1 capsule (36,000 Units total) as needed (with snacks). 240 capsule 11   LORazepam (ATIVAN) 0.5 MG tablet Take 1 tablet (0.5 mg total) by mouth every 12 (twelve) hours as needed for anxiety. 30 tablet 0   magic mouthwash SOLN SWISH WITH 30 MLS BY MOUTH FOUR TIMES DAILY AND SWALLOW. NO FOOD OR DRINK FOR 1 HOUR AFTERWARDS     morphine (MS CONTIN) 15 MG 12 hr tablet Take 1 tablet (15 mg total) by mouth every 12 (twelve) hours. For cancer pain 60 tablet 0   omeprazole (PRILOSEC) 20 MG capsule Take 1 capsule (20 mg total) by mouth daily. 30 capsule 0   ondansetron (ZOFRAN) 8 MG tablet Take 1 tablet (8 mg total) by mouth 2 (two) times daily as needed. Start on day 3 after chemotherapy. 30 tablet 1   oxyCODONE (OXY IR/ROXICODONE) 5 MG immediate release tablet Take 1 tablet (5 mg total) by mouth every 6 (six) hours as needed for  severe pain or moderate pain. 60 tablet 0   prochlorperazine  (COMPAZINE) 10 MG tablet Take 1 tablet (10 mg total) by mouth every 6 (six) hours as needed for nausea or vomiting. 30 tablet 1   senna-docusate (SENOKOT-S) 8.6-50 MG tablet Take 1 tablet by mouth 2 (two) times daily.     No current facility-administered medications for this visit.   Facility-Administered Medications Ordered in Other Visits  Medication Dose Route Frequency Provider Last Rate Last Admin   fluorouracil (ADRUCIL) 5,000 mg in sodium chloride 0.9 % 150 mL chemo infusion  2,451 mg/m2 (Treatment Plan Recorded) Intravenous 1 day or 1 dose Ahsley Attwood, MD       fluorouracil (ADRUCIL) chemo injection 800 mg  400 mg/m2 (Treatment Plan Recorded) Intravenous Once [place] Agostinelli, Arletha Pili, MD       leucovorin 812 mg in dextrose 5 % 250 mL infusion  400 mg/m2 (Treatment Plan Recorded) Intravenous Once Kyler Germer, MD       oxaliplatin (ELOXATIN) 120 mg in dextrose 5 % 500 mL chemo infusion  60 mg/m2 (Treatment Plan Recorded) Intravenous Once Criss Pallone, Arletha Pili, MD        PHYSICAL EXAMINATION: ECOG PERFORMANCE STATUS: 1 - Symptomatic but completely ambulatory   Physical Exam Constitutional:      General: He is not in acute distress.    Appearance: Normal appearance.  HENT:     Head: Normocephalic and atraumatic.  Cardiovascular:     Rate and Rhythm: Normal rate and regular rhythm.     Pulses: Normal pulses.     Heart sounds: Normal heart sounds.  Pulmonary:     Effort: Pulmonary effort is normal.     Breath sounds: Normal breath sounds.  Abdominal:     General: Abdomen is flat. Bowel sounds are normal.     Palpations: Abdomen is soft.     Tenderness: abdominal tenderness (Bowel sounds appear to be hyperactive, no major guarding or rigidity, some left upper quadrant tenderness.) There is no right CVA tenderness or guarding.  Musculoskeletal:        General: Normal range of motion.     Cervical back: Normal range of motion and neck supple.  Lymphadenopathy:     Cervical: No cervical  adenopathy.  Skin:    General: Skin is warm and dry.  Neurological:     General: No focal deficit present.     Mental Status: He is alert.  Psychiatric:        Mood and Affect: Mood normal.        Behavior: Behavior normal.    LABORATORY DATA:  I have reviewed the data as listed Lab Results  Component Value Date   WBC 4.4 08/31/2020   HGB 11.8 (L) 08/31/2020   HCT 33.3 (L) 08/31/2020   MCV 90.0 08/31/2020   PLT 131 (L) 08/31/2020     Chemistry      Component Value Date/Time   NA 141 08/31/2020 1007   K 3.8 08/31/2020 1007   CL 107 08/31/2020 1007   CO2 25 08/31/2020 1007   BUN 9 08/31/2020 1007   CREATININE 0.90 08/31/2020 1007   CREATININE 1.11 11/03/2017 1533      Component Value Date/Time   CALCIUM 8.8 (L) 08/31/2020 1007   ALKPHOS 76 08/31/2020 1007   AST 12 (L) 08/31/2020 1007   ALT 10 08/31/2020 1007   BILITOT 0.5 08/31/2020 1007      RADIOGRAPHIC STUDIES: I have personally reviewed the radiological images as listed and agreed with the  findings in the report. CT CHEST ABDOMEN PELVIS W CONTRAST  Result Date: 08/27/2020 CLINICAL DATA:  Stage IV pancreatic adenocarcinoma on chemotherapy. Restaging. Weight loss, abdominal pain, nausea and constipation. EXAM: CT CHEST, ABDOMEN, AND PELVIS WITH CONTRAST TECHNIQUE: Multidetector CT imaging of the chest, abdomen and pelvis was performed following the standard protocol during bolus administration of intravenous contrast. CONTRAST:  153m OMNIPAQUE IOHEXOL 350 MG/ML SOLN COMPARISON:  07/13/2020 CT abdomen/pelvis.  06/17/2020 chest CT. FINDINGS: CT CHEST FINDINGS Cardiovascular: Normal heart size. No significant pericardial effusion/thickening. Left anterior descending and right coronary atherosclerosis. Right internal jugular Port-A-Cath terminates in the lower third of the SVC. Atherosclerotic nonaneurysmal thoracic aorta. Normal caliber pulmonary arteries. No central pulmonary emboli. Mediastinum/Nodes: Surgically absent  thyroid. Unremarkable esophagus. No pathologically enlarged axillary, mediastinal or hilar lymph nodes. Lungs/Pleura: No pneumothorax. No pleural effusion. No acute consolidative airspace disease or lung masses. A few scattered solid pulmonary nodules in both lungs, largest 5 mm in the medial left lower lobe (series 5/image 82), all stable since 06/17/2020 chest CT. No new significant pulmonary nodules. Musculoskeletal: No aggressive appearing focal osseous lesions. Marked thoracic spondylosis. CT ABDOMEN PELVIS FINDINGS Hepatobiliary: Normal liver size. Indistinct slightly hypodense 0.9 cm liver lesion adjacent to the gallbladder fundus (series 3/image 103), slightly decreased from 1.1 cm on 07/13/2020 CT. Small 0.5 cm soft tissue nodule along the posteroinferior right liver capsule (series 3/image 120), previously 0.5 cm, stable. No new liver lesions. Normal gallbladder with no radiopaque cholelithiasis. No biliary ductal dilatation. Pancreas: Heterogeneous hypodense 3.7 x 2.8 cm pancreatic neck mass (series 3/image 102), previously 3.7 x 2.7 cm on 07/13/2020 CT using similar measurement technique, stable. Stable pancreatic duct dilation (8 mm diameter) and parenchymal atrophy throughout the pancreatic body and tail. Spleen: Normal size. No mass. Adrenals/Urinary Tract: Normal adrenals. Normal kidneys with no hydronephrosis and no renal mass. Normal bladder. Stomach/Bowel: Normal non-distended stomach. Normal caliber small bowel with no small bowel wall thickening. Normal appendix. Oral contrast transits to the colon. Normal large bowel with no diverticulosis, large bowel wall thickening or pericolonic fat stranding. Vascular/Lymphatic: Mildly atherosclerotic nonaneurysmal abdominal aorta. Patent hepatic and renal veins. New small nonocclusive thrombosis in the main portal vein (series 8/image 36). Persistent near occlusion of the proximal main portal vein with narrowing of the portal splenic venous confluence. No  pathologically enlarged lymph nodes in the abdomen or pelvis. Reproductive: Top-normal size prostate. Other: No pneumoperitoneum, ascites or focal fluid collection. Musculoskeletal: No aggressive appearing focal osseous lesions. Mild lumbar spondylosis. IMPRESSION: 1. Pancreatic neck mass is stable. 2. No new or progressive metastatic disease. Tiny soft tissue nodule along the posteroinferior right liver capsule is stable. Solitary liver metastasis adjacent to the gallbladder is mildly decreased. Small bilateral pulmonary nodules are all stable. 3. New small nonocclusive thrombosis in the main portal vein. Persistent near occlusion of the proximal main portal vein with narrowing of the portal splenic venous confluence. 4. Aortic Atherosclerosis (ICD10-I70.0). Electronically Signed   By: JIlona SorrelM.D.   On: 08/27/2020 14:09    I discussed imaging results, concern for portal vein thrombosis and need for anticoagulation.  We have also discussed about constipation regimen, continuing on chemotherapy, antihypertensive management today in detail. All questions were answered. The patient knows to call the clinic with any problems, questions or concerns.  I spent 40 minutes in the care of this patient including management of hypertension, anticoagulation, discussion with radiology, discussion with patient.   PBenay Pike MD 08/31/2020 1:11 PM

## 2020-08-31 NOTE — Assessment & Plan Note (Signed)
He is currently using lorazepam twice a day for management of anxiety.  We will continue this.

## 2020-08-31 NOTE — Assessment & Plan Note (Addendum)
In the past I have recommended him to discontinue clonidine and to consider another form of antihypertensive given some drug drug interaction.  He apparently has not started on any other antihypertensive.  His blood pressure has been repeatedly high on sequential measurements hence I have asked him to go back on ramipril which is his antihypertensive medication. We also had to give him a dose of hydralazine to bring his blood pressure down today.  He understands and is agreeable.  He knows that very high blood pressures can lead to increased risk of heart attacks and strokes.

## 2020-08-31 NOTE — Assessment & Plan Note (Signed)
This is a very pleasant 65 year old male patient with pancreatic adenocarcinoma, stage IV on FOLFOX chemotherapy status post 4 cycles of chemotherapy and most recent imaging showing stable disease who is here for cycle 5 of planned chemotherapy.  Since his last visit, his pain is better controlled, no nausea or vomiting or diarrhea or mucositis.  He is very bothered by his constipation and has been using lactulose, Colace and MiraLAX. Okay to proceed with chemotherapy as planned if blood pressure is better controlled.  We had to give him 1 dose of hydralazine since his SBP was 180 today. He is agreeable to proceed with the recommendations.

## 2020-09-02 ENCOUNTER — Inpatient Hospital Stay: Payer: 59

## 2020-09-02 ENCOUNTER — Encounter: Payer: Self-pay | Admitting: Hematology and Oncology

## 2020-09-02 ENCOUNTER — Other Ambulatory Visit: Payer: Self-pay

## 2020-09-02 VITALS — BP 126/81 | HR 70 | Temp 98.8°F | Resp 16

## 2020-09-02 DIAGNOSIS — Z95828 Presence of other vascular implants and grafts: Secondary | ICD-10-CM

## 2020-09-02 DIAGNOSIS — Z5111 Encounter for antineoplastic chemotherapy: Secondary | ICD-10-CM | POA: Diagnosis not present

## 2020-09-02 MED ORDER — SODIUM CHLORIDE 0.9% FLUSH
10.0000 mL | Freq: Once | INTRAVENOUS | Status: AC
Start: 2020-09-02 — End: 2020-09-02
  Administered 2020-09-02: 10 mL
  Filled 2020-09-02: qty 10

## 2020-09-02 MED ORDER — HEPARIN SOD (PORK) LOCK FLUSH 100 UNIT/ML IV SOLN
500.0000 [IU] | Freq: Once | INTRAVENOUS | Status: AC
  Administered 2020-09-02: 500 [IU]
  Filled 2020-09-02: qty 5

## 2020-09-02 NOTE — Patient Instructions (Signed)

## 2020-09-03 ENCOUNTER — Other Ambulatory Visit: Payer: 59

## 2020-09-03 ENCOUNTER — Ambulatory Visit: Payer: 59

## 2020-09-05 ENCOUNTER — Other Ambulatory Visit (HOSPITAL_COMMUNITY): Payer: Self-pay | Admitting: Hematology and Oncology

## 2020-09-08 ENCOUNTER — Encounter: Payer: Self-pay | Admitting: Family Medicine

## 2020-09-11 ENCOUNTER — Other Ambulatory Visit: Payer: Self-pay | Admitting: Hematology and Oncology

## 2020-09-14 ENCOUNTER — Inpatient Hospital Stay: Payer: 59 | Attending: Genetic Counselor

## 2020-09-14 ENCOUNTER — Encounter: Payer: Self-pay | Admitting: Hematology and Oncology

## 2020-09-14 ENCOUNTER — Inpatient Hospital Stay (HOSPITAL_BASED_OUTPATIENT_CLINIC_OR_DEPARTMENT_OTHER): Payer: 59 | Admitting: Hematology and Oncology

## 2020-09-14 ENCOUNTER — Other Ambulatory Visit: Payer: Self-pay

## 2020-09-14 ENCOUNTER — Inpatient Hospital Stay: Payer: 59

## 2020-09-14 VITALS — BP 148/92 | HR 51 | Temp 98.2°F | Resp 17 | Wt 183.3 lb

## 2020-09-14 DIAGNOSIS — Z452 Encounter for adjustment and management of vascular access device: Secondary | ICD-10-CM | POA: Diagnosis not present

## 2020-09-14 DIAGNOSIS — Z5111 Encounter for antineoplastic chemotherapy: Secondary | ICD-10-CM | POA: Insufficient documentation

## 2020-09-14 DIAGNOSIS — R634 Abnormal weight loss: Secondary | ICD-10-CM | POA: Diagnosis not present

## 2020-09-14 DIAGNOSIS — C787 Secondary malignant neoplasm of liver and intrahepatic bile duct: Secondary | ICD-10-CM | POA: Diagnosis present

## 2020-09-14 DIAGNOSIS — D696 Thrombocytopenia, unspecified: Secondary | ICD-10-CM | POA: Diagnosis not present

## 2020-09-14 DIAGNOSIS — K5909 Other constipation: Secondary | ICD-10-CM | POA: Insufficient documentation

## 2020-09-14 DIAGNOSIS — G893 Neoplasm related pain (acute) (chronic): Secondary | ICD-10-CM

## 2020-09-14 DIAGNOSIS — K5903 Drug induced constipation: Secondary | ICD-10-CM

## 2020-09-14 DIAGNOSIS — C259 Malignant neoplasm of pancreas, unspecified: Secondary | ICD-10-CM

## 2020-09-14 LAB — CBC WITH DIFFERENTIAL (CANCER CENTER ONLY)
Abs Immature Granulocytes: 0.01 10*3/uL (ref 0.00–0.07)
Basophils Absolute: 0.1 10*3/uL (ref 0.0–0.1)
Basophils Relative: 2 %
Eosinophils Absolute: 0.1 10*3/uL (ref 0.0–0.5)
Eosinophils Relative: 2 %
HCT: 32.4 % — ABNORMAL LOW (ref 39.0–52.0)
Hemoglobin: 10.9 g/dL — ABNORMAL LOW (ref 13.0–17.0)
Immature Granulocytes: 0 %
Lymphocytes Relative: 37 %
Lymphs Abs: 1.2 10*3/uL (ref 0.7–4.0)
MCH: 31.3 pg (ref 26.0–34.0)
MCHC: 33.6 g/dL (ref 30.0–36.0)
MCV: 93.1 fL (ref 80.0–100.0)
Monocytes Absolute: 0.3 10*3/uL (ref 0.1–1.0)
Monocytes Relative: 8 %
Neutro Abs: 1.7 10*3/uL (ref 1.7–7.7)
Neutrophils Relative %: 51 %
Platelet Count: 87 10*3/uL — ABNORMAL LOW (ref 150–400)
RBC: 3.48 MIL/uL — ABNORMAL LOW (ref 4.22–5.81)
RDW: 17.2 % — ABNORMAL HIGH (ref 11.5–15.5)
WBC Count: 3.4 10*3/uL — ABNORMAL LOW (ref 4.0–10.5)
nRBC: 0 % (ref 0.0–0.2)

## 2020-09-14 LAB — CMP (CANCER CENTER ONLY)
ALT: 10 U/L (ref 0–44)
AST: 10 U/L — ABNORMAL LOW (ref 15–41)
Albumin: 3.3 g/dL — ABNORMAL LOW (ref 3.5–5.0)
Alkaline Phosphatase: 66 U/L (ref 38–126)
Anion gap: 9 (ref 5–15)
BUN: 12 mg/dL (ref 8–23)
CO2: 25 mmol/L (ref 22–32)
Calcium: 8.7 mg/dL — ABNORMAL LOW (ref 8.9–10.3)
Chloride: 110 mmol/L (ref 98–111)
Creatinine: 1.01 mg/dL (ref 0.61–1.24)
GFR, Estimated: >60 mL/min (ref >60)
Glucose, Bld: 142 mg/dL — ABNORMAL HIGH (ref 70–99)
Potassium: 3.7 mmol/L (ref 3.5–5.1)
Sodium: 144 mmol/L (ref 135–145)
Total Bilirubin: 0.6 mg/dL (ref 0.3–1.2)
Total Protein: 5.7 g/dL — ABNORMAL LOW (ref 6.5–8.1)

## 2020-09-14 NOTE — Progress Notes (Signed)
[facility] FOLLOW UP NOTE  Patient Care Team: [name], MD as PCP - General (Hematology and Oncology) Gatha Mayer, MD as Consulting Physician (Gastroenterology) [name], MD as Consulting Physician (Otolaryngology) Elayne Snare, MD as Consulting Physician (Endocrinology) Gaye Pollack, MD as Consulting Physician (Cardiothoracic Surgery) Lucas Mallow, MD as Consulting Physician (Urology) Debara Pickett Nadean Corwin, MD as Consulting Physician (Cardiology) [name], MD as Consulting Physician (Hematology and Oncology) Jonnie Finner, RN (Inactive) as Oncology Nurse Navigator  CHIEF COMPLAINTS/PURPOSE OF CONSULTATION:  Pancreatic cancer, follow up prior to 4th cycle of FOLFIRINOX  ASSESSMENT & PLAN:   Adenocarcinoma of pancreas, stage 4 ([facility]) This is a very pleasant 65 year old male patient with pancreatic adenocarcinoma, stage IV on FOLFOX chemotherapy status post 4 cycles of chemotherapy and most recent imaging showing stable disease who is here for cycle 6 of planned chemotherapy. He is doing well except for numbness in his feet and dysgeusia.  He is already dose reduced on oxaliplatin 60 mg per metered square, will dose reduce it to 50 mg per metered square given numbness. Otherwise his pain is very well-tolerated. Given thrombocytopenia today, we will delay the chemotherapy by week, his chemotherapy will be rescheduled  Weight loss, unintentional He has gained 3 pounds since his last visit, he continues to eat well despite dysgeusia.  We will continue to monitor his weight.  Cancer related pain He continues on oxycodone as needed, currently needing about 2 or 3week.  He has not been using his morphine very much.  He has not taken his Dilaudid in weeks.  Drug induced constipation This has resolved since his last visit.  Orders Placed This Encounter  Procedures   CBC with Differential/Platelet    Standing Status:   Standing    Number of  Occurrences:   22    Standing Expiration Date:   09/14/2021   CMP ([facility] only)    Standing Status:   Future    Standing Expiration Date:   09/14/2021     HISTORY OF PRESENTING ILLNESS:   PANAGIOTIS OELKERS 65 y.o. male is here because of new diagnosis of pancreatic cancer.  Oncology History  Adenocarcinoma of pancreas, stage 4 ([place])  06/20/2020 Initial Diagnosis   Adenocarcinoma of pancreas, stage 4 ([place])    07/01/2020 -  Chemotherapy    Patient is on Treatment Plan: PANCREAS MODIFIED FOLFIRINOX Q14D X 4 CYCLES       07/01/2020 Genetic Testing   Negative genetic testing on a custom panel reviewing multicancer and pancreatitis genes.  The Custom-Gene Panel offered by [facility] includes sequencing and/or deletion duplication testing of the following 91 genes: AIP, ALK, APC*, ATM*, AXIN2, BAP1, BARD1, BLM, BMPR1A, BRCA1, BRCA2, BRIP1, CASR, CDC73, CDH1, CDK4, CDKN1B, CDKN1C, CDKN2A (p14ARF), CDKN2A (p16INK4a), CEBPA, CFTR*, CHEK2, CPA1, CTNNA1, CTRC, DICER1*, DIS3L2, EGFR, EPCAM*, FANCC, FH*, FLCN, GATA2, GPC3*, GREM1*, HOXB13, HRAS, KIT, MAX*, MEN1*, MET*, MITF*, MLH1*, MSH2*, MSH3*, MSH6*, MUTYH, NBN, NF1*, NF2, NTHL1, PALB2, PALLD, PDGFRA, PHOX2B*, PMS2*, POLD1*, POLE, POT1, PRKAR1A, PRSS1*, PTCH1, PTEN*, RAD50, RAD51C, RAD51D, RB1*, RECQL4*, RET, RUNX1, SDHA*, SDHAF2, SDHB, SDHC*, SDHD, SMAD4, SMARCA4, SMARCB1, SMARCE1, SPINK1, STK11, SUFU, TERC, TERT, TMEM127, TP53, TSC1*, TSC2, VHL, WRN*, and WT1.  The report date is Jul 01, 2020.    Imaging   Imaging after 4 cycles of therapy  IMPRESSION: 1. Pancreatic neck mass is stable. 2. No new or progressive metastatic disease. Tiny soft tissue nodule along the posteroinferior right liver capsule is stable. Solitary  liver metastasis adjacent to the gallbladder is mildly decreased. Small bilateral pulmonary nodules are all stable. 3. New small nonocclusive thrombosis in the main portal vein. Persistent near occlusion of the proximal main  portal vein with narrowing of the portal splenic venous confluence. 4. Aortic Atherosclerosis (ICD10-I70.0).    INTERIM HISTORY  Mr. Hoar is here for a follow-up before cycle 6 He says the last chemotherapy was quite tolerable except for a couple side effects.  He says he cannot taste much and everything tastes like piece of metal.  The only thing he can taste is hot sauce but he cannot really have it.  Besides loss of taste, he has noticed some numbness in his feet, no imbalance or falls.  He also has some abnormal sensation on his face near the corner of his eyes, he feels like there is something stuck on the face but when he actually touches it he cannot feel anything.  Abdominal pain is very well-tolerated, takes occasional oxycodone about 2 or 3 times a week.  Some leg swelling especially when he stands on his feet for a long time.  Blood pressure has gotten better since he has restarted his ramipril.  He has been taking anticoagulation as prescribed, no complaints.  No nausea, vomiting, diarrhea.  Mild mucositis which has resolved.  He feels a bit tired today but says the weekend was quite busy for him. Rest of the pertinent 10 point ROS reviewed and negative.  MEDICAL HISTORY:  Past Medical History:  Diagnosis Date   Arthritis    Atypical chest pain 04/2017   ACS ruled out 05/11/17   CAP (community acquired pneumonia) 05/07/2017   Chronic constipation    at least partially d/t chronic opioids   Diabetes mellitus without complication ([place])    Poor control starting fall 2021-->GAD ab neg.  Insulin and C peptide levels wnl but on lower end of normal->Dr Kuma/endo 2022.   Exocrine pancreatic insufficiency 2022   History of thyroidectomy, total 2018   Hurlthe cell adenoma   Hx of adenomatous polyp of colon 07/02/2009   rpt colonoscopy 2026   Hyperlipidemia    Intol of multiple statins and zetia. [facility] 2022.   Hypertension    Lipoma    back   Myocardial infarction  [facility])    MI at age 7   Pancreatic adenocarcinoma ([place]) 06/2020   Mets to liver (?and lungs?->stage 4), w/acute pancreatitis.   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 Cammy Copa   Right ureteral calculus 04/2018   Cystoscopy with right retrograde pyelogram and right ureteral stent placement after stone removal.   Seasonal allergies    Stroke ([place])    in 16O no complications    Tachyarrhythmia 1980   age 54 X 2 over 28 month period.  No recurrence since that time.   Thoracic aortic aneurysm ([facility])    Incidentally discovered, 4.1 cm-->Dr. Cyndia Bent. Rpt CT 08/2018 and 08/2019 stable.   Vocal cord paralysis 07/2016   Left vocal cord paralysis noted after thyroid surgery.    SURGICAL HISTORY: Past Surgical History:  Procedure Laterality Date   BIOPSY  06/18/2020   Procedure: BIOPSY;  Surgeon: Mansouraty, Telford Nab., MD;  Location: [facility];  Service: Gastroenterology;;   [place]  COLONOSCOPY W/ POLYPECTOMY  07/02/2009; 01/24/20   2011 adenoma.  2021 adenomas x 3->recall 2026   CYSTOSCOPY WITH RETROGRADE PYELOGRAM, URETEROSCOPY AND STENT PLACEMENT Right 05/09/2018   Procedure: CYSTOSCOPY WITH RIGHT RETROGRADE RIGHT URETEROSCOPY STONE EXTRACTION RIGHT STENT EXCHANGE;  Surgeon: Lucas Mallow, MD;  Location: [facility];  Service: Urology;  Laterality: Right;   CYSTOSCOPY/RETROGRADE/URETEROSCOPY/STONE EXTRACTION WITH BASKET Right 04/30/2018   Procedure: CYSTOSCOPY/RETROGRADE/RIGHT URETEROSCOPY/;  Surgeon: Lucas Mallow, MD;  Location: [facility];  Service: Urology;  Laterality: Right;   ESOPHAGOGASTRODUODENOSCOPY  07/02/2009   NORMAL   ESOPHAGOGASTRODUODENOSCOPY (EGD) WITH PROPOFOL N/A 06/18/2020   Procedure: ESOPHAGOGASTRODUODENOSCOPY (EGD) WITH  PROPOFOL;  Surgeon: Rush Landmark Telford Nab., MD;  Location: [facility];  Service: Gastroenterology;  Laterality: N/A;   EYE SURGERY     FINE NEEDLE ASPIRATION  06/18/2020   Procedure: FINE NEEDLE ASPIRATION (FNA) LINEAR;  Surgeon: Irving Copas., MD;  Location: [facility];  Service: Gastroenterology;;   finger amputaion     with reattachement   INCISION AND DRAINAGE ABSCESS ANAL  11/23/2019   gen surg (In ED).   IR IMAGING GUIDED PORT INSERTION  06/22/2020   LASIK     THYROIDECTOMY N/A 08/03/2016   Procedure: Total THYROIDECTOMY;  Surgeon: [name], MD;  Location: [facility];  Service: ENT;  Laterality: N/A;  Total Thyroidectomy    UPPER ESOPHAGEAL ENDOSCOPIC ULTRASOUND (EUS) N/A 06/18/2020   Procedure: UPPER ESOPHAGEAL ENDOSCOPIC ULTRASOUND (EUS);  Surgeon: Irving Copas., MD;  Location: [facility];  Service: Gastroenterology;  Laterality: N/A;   UPPER GASTROINTESTINAL ENDOSCOPY     with EUS and bx of pancreatic mass. +appearance of candida in esoph.      SOCIAL HISTORY: Social History   Socioeconomic History   Marital status: Married    Spouse name: Not on file   Number of children: Not on file   Years of education: Not on file   Highest education level: Not on file  Occupational History   Not on file  Tobacco Use   Smoking status: Never   Smokeless tobacco: Current    Types: Snuff  Vaping Use   Vaping Use: Never used  Substance and Sexual Activity   Alcohol use: No   Drug use: No   Sexual activity: Not on file  Other Topics Concern   Not on file  Social History Narrative   Married, 2 daughters, 1 grandson that lives with him.   Educ: some college   Occup: retired from the [employer].  Retired at 53 but returned to work at some point, now with grandson living with him.   No T/A/Ds.   Social Determinants of Health   Financial Resource Strain: Not on file  Food Insecurity: Not on file  Transportation Needs: Not on file  Physical Activity: Not on  file  Stress: Not on file  Social Connections: Not on file  Intimate Partner Violence: Not on file    FAMILY HISTORY: Family History  Problem Relation Age of Onset   Diabetes Mother    Cancer Mother        lung   COPD Mother    Brain cancer Mother    Arthritis Mother    Hyperlipidemia Mother    Heart disease Mother    Hypertension Mother    Diabetes Father    Cancer Father        mesothelioma; skin   Mental illness Father    Arthritis Father    Hyperlipidemia Father    Heart  disease Father    Hypertension Father    Heart disease Maternal Grandmother        MI @ 19   Heart disease Maternal Grandfather        MI @ 25   Diabetes Paternal Grandmother    Dementia Paternal Grandmother    Heart disease Paternal Grandfather        MI @ 52   Colon cancer Neg Hx    Esophageal cancer Neg Hx    Rectal cancer Neg Hx    Stomach cancer Neg Hx     ALLERGIES:  is allergic to atenolol, hydroxyzine, tetracycline, atorvastatin, codeine, guaifenesin, and mucinex [guaifenesin er].  MEDICATIONS:  Current Outpatient Medications  Medication Sig Dispense Refill   apixaban (ELIQUIS) 5 MG TABS tablet Take 1 tablet (5 mg total) by mouth 2 (two) times daily. 60 tablet 1   Continuous Blood Gluc Sensor (FREESTYLE LIBRE 2 SENSOR) MISC 2 Devices by Does not apply route every 14 (fourteen) days. 2 each 3   dexamethasone (DECADRON) 4 MG tablet Take 2 tablets by mouth 2 (two) times daily.     HUMALOG KWIKPEN 100 UNIT/ML KwikPen Inject 10-15 Units into the skin daily.     insulin glargine (LANTUS SOLOSTAR) 100 UNIT/ML Solostar Pen Give up to 10 units under the skin every evening at bedtime. (Patient taking differently: Inject 20 Units into the skin at bedtime.) 15 mL 1   Insulin Pen Needle 31G X 5 MM MISC Use with insulin pen 3 times a day 100 each 1   lactulose (CHRONULAC) 10 GM/15ML solution Take 15 mLs (10 g total) by mouth 3 (three) times daily as needed for mild constipation. 236 mL 0    levothyroxine (SYNTHROID) 137 MCG tablet TAKE 1 TABLET BY MOUTH  DAILY BEFORE BREAKFAST (Patient taking differently: Take 137 mcg by mouth daily before breakfast.) 90 tablet 3   lipase/protease/amylase (CREON) 36000 UNITS CPEP capsule Take 2 capsules (72,000 Units total) by mouth 3 (three) times daily with meals. May also take 1 capsule (36,000 Units total) as needed (with snacks). 240 capsule 11   LORazepam (ATIVAN) 0.5 MG tablet Take 1 tablet (0.5 mg total) by mouth every 12 (twelve) hours as needed for anxiety. 30 tablet 0   magic mouthwash SOLN SWISH WITH 30 MLS BY MOUTH FOUR TIMES DAILY AND SWALLOW. NO FOOD OR DRINK FOR 1 HOUR AFTERWARDS     morphine (MS CONTIN) 15 MG 12 hr tablet Take 1 tablet (15 mg total) by mouth every 12 (twelve) hours. For cancer pain 60 tablet 0   omeprazole (PRILOSEC) 20 MG capsule TAKE 1 CAPSULE(20 MG) BY MOUTH DAILY 30 capsule 0   ondansetron (ZOFRAN) 8 MG tablet Take 1 tablet (8 mg total) by mouth 2 (two) times daily as needed. Start on day 3 after chemotherapy. 30 tablet 1   oxyCODONE (OXY IR/ROXICODONE) 5 MG immediate release tablet Take 1 tablet (5 mg total) by mouth every 6 (six) hours as needed for severe pain or moderate pain. 60 tablet 0   prochlorperazine (COMPAZINE) 10 MG tablet TAKE 1 TABLET(10 MG) BY MOUTH EVERY 6 HOURS AS NEEDED FOR NAUSEA OR VOMITING 30 tablet 1   senna-docusate (SENOKOT-S) 8.6-50 MG tablet Take 1 tablet by mouth 2 (two) times daily.     No current facility-administered medications for this visit.    PHYSICAL EXAMINATION: ECOG PERFORMANCE STATUS: 1 - Symptomatic but completely ambulatory  Physical Exam Constitutional:      General: He is not in acute  distress.    Appearance: Normal appearance.  HENT:     Head: Normocephalic and atraumatic.  Cardiovascular:     Rate and Rhythm: Normal rate and regular rhythm.     Pulses: Normal pulses.     Heart sounds: Normal heart sounds.  Pulmonary:     Effort: Pulmonary effort is normal.      Breath sounds: Normal breath sounds.  Abdominal:     General: Abdomen is flat. Bowel sounds are normal.     Palpations: Abdomen is soft.     Tenderness: There is abdominal tenderness (Left upper quadrant and left lumbar region tenderness, no guarding or rigidity.). There is no right CVA tenderness or guarding.  Musculoskeletal:        General: Swelling (Symmetrical mild ankle swelling) present. Normal range of motion.     Cervical back: Normal range of motion and neck supple.  Lymphadenopathy:     Cervical: No cervical adenopathy.  Skin:    General: Skin is warm and dry.  Neurological:     General: No focal deficit present.     Mental Status: He is alert.  Psychiatric:        Mood and Affect: Mood normal.        Behavior: Behavior normal.    LABORATORY DATA:  I have reviewed the data as listed Lab Results  Component Value Date   WBC 3.4 (L) 09/14/2020   HGB 10.9 (L) 09/14/2020   HCT 32.4 (L) 09/14/2020   MCV 93.1 09/14/2020   PLT 87 (L) 09/14/2020     Chemistry      Component Value Date/Time   NA 144 09/14/2020 0819   K 3.7 09/14/2020 0819   CL 110 09/14/2020 0819   CO2 25 09/14/2020 0819   BUN 12 09/14/2020 0819   CREATININE 1.01 09/14/2020 0819   CREATININE 1.11 11/03/2017 1533      Component Value Date/Time   CALCIUM 8.7 (L) 09/14/2020 0819   ALKPHOS 66 09/14/2020 0819   AST 10 (L) 09/14/2020 0819   ALT 10 09/14/2020 0819   BILITOT 0.6 09/14/2020 0819      RADIOGRAPHIC STUDIES: I have personally reviewed the radiological images as listed and agreed with the findings in the report. CT CHEST ABDOMEN PELVIS W CONTRAST  Result Date: 08/27/2020 CLINICAL DATA:  Stage IV pancreatic adenocarcinoma on chemotherapy. Restaging. Weight loss, abdominal pain, nausea and constipation. EXAM: CT CHEST, ABDOMEN, AND PELVIS WITH CONTRAST TECHNIQUE: Multidetector CT imaging of the chest, abdomen and pelvis was performed following the standard protocol during bolus  administration of intravenous contrast. CONTRAST:  181m OMNIPAQUE IOHEXOL 350 MG/ML SOLN COMPARISON:  07/13/2020 CT abdomen/pelvis.  06/17/2020 chest CT. FINDINGS: CT CHEST FINDINGS Cardiovascular: Normal heart size. No significant pericardial effusion/thickening. Left anterior descending and right coronary atherosclerosis. Right internal jugular Port-A-Cath terminates in the lower third of the SVC. Atherosclerotic nonaneurysmal thoracic aorta. Normal caliber pulmonary arteries. No central pulmonary emboli. Mediastinum/Nodes: Surgically absent thyroid. Unremarkable esophagus. No pathologically enlarged axillary, mediastinal or hilar lymph nodes. Lungs/Pleura: No pneumothorax. No pleural effusion. No acute consolidative airspace disease or lung masses. A few scattered solid pulmonary nodules in both lungs, largest 5 mm in the medial left lower lobe (series 5/image 82), all stable since 06/17/2020 chest CT. No new significant pulmonary nodules. Musculoskeletal: No aggressive appearing focal osseous lesions. Marked thoracic spondylosis. CT ABDOMEN PELVIS FINDINGS Hepatobiliary: Normal liver size. Indistinct slightly hypodense 0.9 cm liver lesion adjacent to the gallbladder fundus (series 3/image 103), slightly decreased from 1.1  cm on 07/13/2020 CT. Small 0.5 cm soft tissue nodule along the posteroinferior right liver capsule (series 3/image 120), previously 0.5 cm, stable. No new liver lesions. Normal gallbladder with no radiopaque cholelithiasis. No biliary ductal dilatation. Pancreas: Heterogeneous hypodense 3.7 x 2.8 cm pancreatic neck mass (series 3/image 102), previously 3.7 x 2.7 cm on 07/13/2020 CT using similar measurement technique, stable. Stable pancreatic duct dilation (8 mm diameter) and parenchymal atrophy throughout the pancreatic body and tail. Spleen: Normal size. No mass. Adrenals/Urinary Tract: Normal adrenals. Normal kidneys with no hydronephrosis and no renal mass. Normal bladder. Stomach/Bowel:  Normal non-distended stomach. Normal caliber small bowel with no small bowel wall thickening. Normal appendix. Oral contrast transits to the colon. Normal large bowel with no diverticulosis, large bowel wall thickening or pericolonic fat stranding. Vascular/Lymphatic: Mildly atherosclerotic nonaneurysmal abdominal aorta. Patent hepatic and renal veins. New small nonocclusive thrombosis in the main portal vein (series 8/image 36). Persistent near occlusion of the proximal main portal vein with narrowing of the portal splenic venous confluence. No pathologically enlarged lymph nodes in the abdomen or pelvis. Reproductive: Top-normal size prostate. Other: No pneumoperitoneum, ascites or focal fluid collection. Musculoskeletal: No aggressive appearing focal osseous lesions. Mild lumbar spondylosis. IMPRESSION: 1. Pancreatic neck mass is stable. 2. No new or progressive metastatic disease. Tiny soft tissue nodule along the posteroinferior right liver capsule is stable. Solitary liver metastasis adjacent to the gallbladder is mildly decreased. Small bilateral pulmonary nodules are all stable. 3. New small nonocclusive thrombosis in the main portal vein. Persistent near occlusion of the proximal main portal vein with narrowing of the portal splenic venous confluence. 4. Aortic Atherosclerosis (ICD10-I70.0). Electronically Signed   By: Ilona Sorrel M.D.   On: 08/27/2020 14:09    All questions were answered. The patient knows to call the clinic with any problems, questions or concerns.   [name], MD 09/14/2020 9:30 AM

## 2020-09-14 NOTE — Assessment & Plan Note (Signed)
This has resolved since his last visit.

## 2020-09-14 NOTE — Assessment & Plan Note (Signed)
This is a very pleasant 65 year old male patient with pancreatic adenocarcinoma, stage IV on FOLFOX chemotherapy status post 4 cycles of chemotherapy and most recent imaging showing stable disease who is here for cycle 6 of planned chemotherapy. He is doing well except for numbness in his feet and dysgeusia.  He is already dose reduced on oxaliplatin 60 mg per metered square, will dose reduce it to 50 mg per metered square given numbness. Otherwise his pain is very well-tolerated. Given thrombocytopenia today, we will delay the chemotherapy by week, his chemotherapy will be rescheduled

## 2020-09-14 NOTE — Assessment & Plan Note (Signed)
He continues on oxycodone as needed, currently needing about 2 or 3week.  He has not been using his morphine very much.  He has not taken his Dilaudid in weeks.

## 2020-09-14 NOTE — Assessment & Plan Note (Signed)
He has gained 3 pounds since his last visit, he continues to eat well despite dysgeusia.  We will continue to monitor his weight.

## 2020-09-16 ENCOUNTER — Inpatient Hospital Stay: Payer: 59

## 2020-09-17 ENCOUNTER — Other Ambulatory Visit: Payer: 59

## 2020-09-17 ENCOUNTER — Ambulatory Visit: Payer: 59 | Admitting: Hematology and Oncology

## 2020-09-17 ENCOUNTER — Ambulatory Visit: Payer: 59

## 2020-09-21 ENCOUNTER — Other Ambulatory Visit: Payer: Self-pay | Admitting: Endocrinology

## 2020-09-21 DIAGNOSIS — E1165 Type 2 diabetes mellitus with hyperglycemia: Secondary | ICD-10-CM

## 2020-09-23 ENCOUNTER — Other Ambulatory Visit: Payer: Self-pay | Admitting: Hematology

## 2020-09-23 ENCOUNTER — Other Ambulatory Visit: Payer: Self-pay

## 2020-09-23 ENCOUNTER — Inpatient Hospital Stay: Payer: 59

## 2020-09-23 VITALS — BP 160/83 | HR 61 | Temp 98.1°F | Resp 17

## 2020-09-23 DIAGNOSIS — C259 Malignant neoplasm of pancreas, unspecified: Secondary | ICD-10-CM

## 2020-09-23 DIAGNOSIS — Z5111 Encounter for antineoplastic chemotherapy: Secondary | ICD-10-CM | POA: Diagnosis not present

## 2020-09-23 LAB — CBC WITH DIFFERENTIAL/PLATELET
Abs Immature Granulocytes: 0.02 10*3/uL (ref 0.00–0.07)
Basophils Absolute: 0 10*3/uL (ref 0.0–0.1)
Basophils Relative: 1 %
Eosinophils Absolute: 0 10*3/uL (ref 0.0–0.5)
Eosinophils Relative: 1 %
HCT: 34.3 % — ABNORMAL LOW (ref 39.0–52.0)
Hemoglobin: 11.7 g/dL — ABNORMAL LOW (ref 13.0–17.0)
Immature Granulocytes: 1 %
Lymphocytes Relative: 21 %
Lymphs Abs: 0.8 10*3/uL (ref 0.7–4.0)
MCH: 32.3 pg (ref 26.0–34.0)
MCHC: 34.1 g/dL (ref 30.0–36.0)
MCV: 94.8 fL (ref 80.0–100.0)
Monocytes Absolute: 0.4 10*3/uL (ref 0.1–1.0)
Monocytes Relative: 9 %
Neutro Abs: 2.7 10*3/uL (ref 1.7–7.7)
Neutrophils Relative %: 67 %
Platelets: 102 10*3/uL — ABNORMAL LOW (ref 150–400)
RBC: 3.62 MIL/uL — ABNORMAL LOW (ref 4.22–5.81)
RDW: 16.7 % — ABNORMAL HIGH (ref 11.5–15.5)
WBC: 4 10*3/uL (ref 4.0–10.5)
nRBC: 0 % (ref 0.0–0.2)

## 2020-09-23 LAB — CMP (CANCER CENTER ONLY)
ALT: 10 U/L (ref 0–44)
AST: 13 U/L — ABNORMAL LOW (ref 15–41)
Albumin: 3.5 g/dL (ref 3.5–5.0)
Alkaline Phosphatase: 71 U/L (ref 38–126)
Anion gap: 9 (ref 5–15)
BUN: 10 mg/dL (ref 8–23)
CO2: 26 mmol/L (ref 22–32)
Calcium: 8.7 mg/dL — ABNORMAL LOW (ref 8.9–10.3)
Chloride: 107 mmol/L (ref 98–111)
Creatinine: 0.84 mg/dL (ref 0.61–1.24)
GFR, Estimated: >60 mL/min (ref >60)
Glucose, Bld: 160 mg/dL — ABNORMAL HIGH (ref 70–99)
Potassium: 3.2 mmol/L — ABNORMAL LOW (ref 3.5–5.1)
Sodium: 142 mmol/L (ref 135–145)
Total Bilirubin: 0.5 mg/dL (ref 0.3–1.2)
Total Protein: 5.4 g/dL — ABNORMAL LOW (ref 6.5–8.1)

## 2020-09-23 MED ORDER — LORAZEPAM 0.5 MG PO TABS
0.5000 mg | ORAL_TABLET | Freq: Two times a day (BID) | ORAL | 0 refills | Status: DC | PRN
Start: 2020-09-23 — End: 2020-09-23

## 2020-09-23 MED ORDER — SODIUM CHLORIDE 0.9 % IV SOLN
150.0000 mg | Freq: Once | INTRAVENOUS | Status: AC
  Administered 2020-09-23: 150 mg via INTRAVENOUS
  Filled 2020-09-23: qty 150

## 2020-09-23 MED ORDER — FLUOROURACIL CHEMO INJECTION 5 GM/100ML
2451.0000 mg/m2 | INTRAVENOUS | Status: DC
  Administered 2020-09-23: 5000 mg via INTRAVENOUS
  Filled 2020-09-23: qty 100

## 2020-09-23 MED ORDER — OXALIPLATIN CHEMO INJECTION 100 MG/20ML
50.0000 mg/m2 | Freq: Once | INTRAVENOUS | Status: AC
  Administered 2020-09-23: 100 mg via INTRAVENOUS
  Filled 2020-09-23: qty 20

## 2020-09-23 MED ORDER — LORAZEPAM 0.5 MG PO TABS: 0.5000 mg | ORAL_TABLET | Freq: Two times a day (BID) | ORAL | 0 refills | Status: DC | PRN

## 2020-09-23 MED ORDER — DEXTROSE 5 % IV SOLN
Freq: Once | INTRAVENOUS | Status: AC
  Filled 2020-09-23: qty 250

## 2020-09-23 MED ORDER — EPOETIN ALFA-EPBX 40000 UNIT/ML IJ SOLN
INTRAMUSCULAR | Status: AC
  Filled 2020-09-23: qty 2

## 2020-09-23 MED ORDER — HEPARIN SOD (PORK) LOCK FLUSH 100 UNIT/ML IV SOLN
500.0000 [IU] | Freq: Once | INTRAVENOUS | Status: DC | PRN
  Filled 2020-09-23: qty 5

## 2020-09-23 MED ORDER — FLUOROURACIL CHEMO INJECTION 2.5 GM/50ML
400.0000 mg/m2 | Freq: Once | INTRAVENOUS | Status: AC
  Administered 2020-09-23: 800 mg via INTRAVENOUS
  Filled 2020-09-23: qty 16

## 2020-09-23 MED ORDER — PEGFILGRASTIM-CBQV 6 MG/0.6ML ~~LOC~~ SOSY
PREFILLED_SYRINGE | SUBCUTANEOUS | Status: AC
  Filled 2020-09-23: qty 0.6

## 2020-09-23 MED ORDER — SODIUM CHLORIDE 0.9% FLUSH
10.0000 mL | INTRAVENOUS | Status: DC | PRN
  Filled 2020-09-23: qty 10

## 2020-09-23 MED ORDER — LORAZEPAM 1 MG PO TABS
ORAL_TABLET | ORAL | Status: AC
  Filled 2020-09-23: qty 1

## 2020-09-23 MED ORDER — DENOSUMAB 120 MG/1.7ML ~~LOC~~ SOLN
SUBCUTANEOUS | Status: AC
  Filled 2020-09-23: qty 1.7

## 2020-09-23 MED ORDER — SODIUM CHLORIDE 0.9 % IV SOLN
10.0000 mg | Freq: Once | INTRAVENOUS | Status: AC
  Administered 2020-09-23: 10 mg via INTRAVENOUS
  Filled 2020-09-23: qty 10

## 2020-09-23 MED ORDER — LORAZEPAM 1 MG PO TABS: 0.5000 mg | ORAL_TABLET | Freq: Once | ORAL | Status: DC

## 2020-09-23 MED ORDER — CYANOCOBALAMIN 1000 MCG/ML IJ SOLN
INTRAMUSCULAR | Status: AC
  Filled 2020-09-23: qty 1

## 2020-09-23 MED ORDER — DARBEPOETIN ALFA 300 MCG/0.6ML IJ SOSY
PREFILLED_SYRINGE | INTRAMUSCULAR | Status: AC
  Filled 2020-09-23: qty 1.2

## 2020-09-23 MED ORDER — ATROPINE SULFATE 1 MG/ML IJ SOLN: 0.5000 mg | Freq: Once | INTRAMUSCULAR | Status: DC | PRN

## 2020-09-23 MED ORDER — PALONOSETRON HCL INJECTION 0.25 MG/5ML
INTRAVENOUS | Status: AC
  Filled 2020-09-23: qty 5

## 2020-09-23 MED ORDER — SODIUM CHLORIDE 0.9 % IV SOLN
400.0000 mg/m2 | Freq: Once | INTRAVENOUS | Status: AC
  Administered 2020-09-23: 812 mg via INTRAVENOUS
  Filled 2020-09-23: qty 40.6

## 2020-09-23 MED ORDER — PALONOSETRON HCL INJECTION 0.25 MG/5ML
0.2500 mg | Freq: Once | INTRAVENOUS | Status: AC
  Administered 2020-09-23: 0.25 mg via INTRAVENOUS

## 2020-09-23 MED ORDER — LORAZEPAM 1 MG PO TABS
0.5000 mg | ORAL_TABLET | Freq: Once | ORAL | Status: AC
  Administered 2020-09-23: 0.5 mg via ORAL

## 2020-09-23 NOTE — Patient Instructions (Signed)
[facility] [place]  Discharge Instructions: Thank you for choosing [facility] to provide your oncology and hematology care.   If you have a lab appointment with the [facility], please go directly to the [facility] and check in at the registration area.  Wear comfortable clothing and clothing appropriate for easy access to any Portacath or PICC line.   We strive to give you quality time with your provider. You may need to reschedule your appointment if you arrive late (15 or more minutes).  Arriving late affects you and other patients whose appointments are after yours.  Also, if you miss three or more appointments without notifying the office, you may be dismissed from the clinic at the provider's discretion.      For prescription refill requests, have your pharmacy contact our office and allow 72 hours for refills to be completed.    Today you received the following chemotherapy and/or immunotherapy agents Oxaliplatin, Leucovorin, 5FU.     To help prevent nausea and vomiting after your treatment, we encourage you to take your nausea medication as directed.  BELOW ARE SYMPTOMS THAT SHOULD BE REPORTED IMMEDIATELY: *FEVER GREATER THAN 100.4 F (38 C) OR HIGHER *CHILLS OR SWEATING *NAUSEA AND VOMITING THAT IS NOT CONTROLLED WITH YOUR NAUSEA MEDICATION *UNUSUAL SHORTNESS OF BREATH *UNUSUAL BRUISING OR BLEEDING *URINARY PROBLEMS (pain or burning when urinating, or frequent urination) *BOWEL PROBLEMS (unusual diarrhea, constipation, pain near the anus) TENDERNESS IN MOUTH AND THROAT WITH OR WITHOUT PRESENCE OF ULCERS (sore throat, sores in mouth, or a toothache) UNUSUAL RASH, SWELLING OR PAIN  UNUSUAL VAGINAL DISCHARGE OR ITCHING   Items with * indicate a potential emergency and should be followed up as soon as possible or go to the Emergency Department if any problems should occur.  Please show the CHEMOTHERAPY ALERT CARD or IMMUNOTHERAPY ALERT CARD  at check-in to the Emergency Department and triage nurse. Should you have questions after your visit or need to cancel or reschedule your appointment, please contact [facility]  [phone] and follow the prompts.  Office hours are 8:00 a.m. to 4:30 p.m. Monday - Friday. Please note that voicemails left after 4:00 p.m. may not be returned until the following business day.  We are closed weekends and major holidays. You have access to a nurse at all times for urgent questions. Please call the main number to the clinic [phone] and follow the prompts.  For any non-urgent questions, you may also contact your provider using MyChart. We now offer e-Visits for anyone 35 and older to request care online for non-urgent symptoms. For details visit [email or web address].   Also download the MyChart app! Go to the app store, search "MyChart", open the app, select [facility], and log in with your MyChart username and password.  Due to Covid, a mask is required upon entering the hospital/clinic. If you do not have a mask, one will be given to you upon arrival. For doctor visits, patients may have 1 support person aged 56 or older with them. For treatment visits, patients cannot have anyone with them due to current Covid guidelines and our immunocompromised population.

## 2020-09-24 ENCOUNTER — Telehealth: Payer: Self-pay | Admitting: Hematology and Oncology

## 2020-09-24 NOTE — Telephone Encounter (Signed)
R/s appts per 8/11 sch msg. Called pt, no answer. Left msg with appts dates and times.

## 2020-09-25 ENCOUNTER — Inpatient Hospital Stay: Payer: 59

## 2020-09-25 ENCOUNTER — Other Ambulatory Visit: Payer: Self-pay

## 2020-09-25 VITALS — BP 156/68 | HR 73 | Temp 98.0°F | Resp 16

## 2020-09-25 DIAGNOSIS — C259 Malignant neoplasm of pancreas, unspecified: Secondary | ICD-10-CM

## 2020-09-25 DIAGNOSIS — Z5111 Encounter for antineoplastic chemotherapy: Secondary | ICD-10-CM | POA: Diagnosis not present

## 2020-09-25 MED ORDER — SODIUM CHLORIDE 0.9% FLUSH
10.0000 mL | INTRAVENOUS | Status: DC | PRN
  Administered 2020-09-25: 10 mL
  Filled 2020-09-25: qty 10

## 2020-09-25 MED ORDER — HEPARIN SOD (PORK) LOCK FLUSH 100 UNIT/ML IV SOLN
500.0000 [IU] | Freq: Once | INTRAVENOUS | Status: AC | PRN
  Administered 2020-09-25: 500 [IU]
  Filled 2020-09-25: qty 5

## 2020-09-25 NOTE — Patient Instructions (Signed)
Fluorouracil, 5-FU injection What is this medication? FLUOROURACIL, 5-FU (flure oh YOOR a sil) is a chemotherapy drug. It slows the growth of cancer cells. This medicine is used to treat many types of cancer like breast cancer, colon or rectal cancer, pancreatic cancer, and stomach cancer. This medicine may be used for other purposes; ask your health care provider or pharmacist if you have questions. COMMON BRAND NAME(S): Adrucil What should I tell my care team before I take this medication? They need to know if you have any of these conditions: blood disorders dihydropyrimidine dehydrogenase (DPD) deficiency infection (especially a virus infection such as chickenpox, cold sores, or herpes) kidney disease liver disease malnourished, poor nutrition recent or ongoing radiation therapy an unusual or allergic reaction to fluorouracil, other chemotherapy, other medicines, foods, dyes, or preservatives pregnant or trying to get pregnant breast-feeding How should I use this medication? This drug is given as an infusion or injection into a vein. It is administered in a hospital or clinic by a specially trained health care professional. Talk to your pediatrician regarding the use of this medicine in children. Special care may be needed. Overdosage: If you think you have taken too much of this medicine contact a poison control center or emergency room at once. NOTE: This medicine is only for you. Do not share this medicine with others. What if I miss a dose? It is important not to miss your dose. Call your doctor or health care professional if you are unable to keep an appointment. What may interact with this medication? Do not take this medicine with any of the following medications: live virus vaccines This medicine may also interact with the following medications: medicines that treat or prevent blood clots like warfarin, enoxaparin, and dalteparin This list may not describe all possible  interactions. Give your health care provider a list of all the medicines, herbs, non-prescription drugs, or dietary supplements you use. Also tell them if you smoke, drink alcohol, or use illegal drugs. Some items may interact with your medicine. What should I watch for while using this medication? Visit your doctor for checks on your progress. This drug may make you feel generally unwell. This is not uncommon, as chemotherapy can affect healthy cells as well as cancer cells. Report any side effects. Continue your course of treatment even though you feel ill unless your doctor tells you to stop. In some cases, you may be given additional medicines to help with side effects. Follow all directions for their use. Call your doctor or health care professional for advice if you get a fever, chills or sore throat, or other symptoms of a cold or flu. Do not treat yourself. This drug decreases your body's ability to fight infections. Try to avoid being around people who are sick. This medicine may increase your risk to bruise or bleed. Call your doctor or health care professional if you notice any unusual bleeding. Be careful brushing and flossing your teeth or using a toothpick because you may get an infection or bleed more easily. If you have any dental work done, tell your dentist you are receiving this medicine. Avoid taking products that contain aspirin, acetaminophen, ibuprofen, naproxen, or ketoprofen unless instructed by your doctor. These medicines may hide a fever. Do not become pregnant while taking this medicine. Women should inform their doctor if they wish to become pregnant or think they might be pregnant. There is a potential for serious side effects to an unborn child. Talk to your health care   professional or pharmacist for more information. Do not breast-feed an infant while taking this medicine. Men should inform their doctor if they wish to father a child. This medicine may lower sperm  counts. Do not treat diarrhea with over the counter products. Contact your doctor if you have diarrhea that lasts more than 2 days or if it is severe and watery. This medicine can make you more sensitive to the sun. Keep out of the sun. If you cannot avoid being in the sun, wear protective clothing and use sunscreen. Do not use sun lamps or tanning beds/booths. What side effects may I notice from receiving this medication? Side effects that you should report to your doctor or health care professional as soon as possible: allergic reactions like skin rash, itching or hives, swelling of the face, lips, or tongue low blood counts - this medicine may decrease the number of white blood cells, red blood cells and platelets. You may be at increased risk for infections and bleeding. signs of infection - fever or chills, cough, sore throat, pain or difficulty passing urine signs of decreased platelets or bleeding - bruising, pinpoint red spots on the skin, black, tarry stools, blood in the urine signs of decreased red blood cells - unusually weak or tired, fainting spells, lightheadedness breathing problems changes in vision chest pain mouth sores nausea and vomiting pain, swelling, redness at site where injected pain, tingling, numbness in the hands or feet redness, swelling, or sores on hands or feet stomach pain unusual bleeding Side effects that usually do not require medical attention (report to your doctor or health care professional if they continue or are bothersome): changes in finger or toe nails diarrhea dry or itchy skin hair loss headache loss of appetite sensitivity of eyes to the light stomach upset unusually teary eyes This list may not describe all possible side effects. Call your doctor for medical advice about side effects. You may report side effects to FDA at 1-800-FDA-1088. Where should I keep my medication? This drug is given in a hospital or clinic and will not be  stored at home. NOTE: This sheet is a summary. It may not cover all possible information. If you have questions about this medicine, talk to your doctor, pharmacist, or health care provider.  2022 [employer] (2019-01-01 15:00:03)  

## 2020-09-29 ENCOUNTER — Inpatient Hospital Stay: Payer: 59 | Admitting: Hematology and Oncology

## 2020-09-29 ENCOUNTER — Inpatient Hospital Stay: Payer: 59

## 2020-10-01 ENCOUNTER — Inpatient Hospital Stay: Payer: 59

## 2020-10-01 ENCOUNTER — Other Ambulatory Visit: Payer: 59

## 2020-10-01 ENCOUNTER — Ambulatory Visit: Payer: 59

## 2020-10-01 ENCOUNTER — Ambulatory Visit: Payer: 59 | Admitting: Hematology and Oncology

## 2020-10-02 ENCOUNTER — Encounter: Payer: Self-pay | Admitting: Endocrinology

## 2020-10-02 ENCOUNTER — Ambulatory Visit (INDEPENDENT_AMBULATORY_CARE_PROVIDER_SITE_OTHER): Payer: 59 | Admitting: Endocrinology

## 2020-10-02 ENCOUNTER — Other Ambulatory Visit: Payer: Self-pay

## 2020-10-02 VITALS — BP 142/82 | HR 79 | Ht 69.0 in | Wt 188.2 lb

## 2020-10-02 DIAGNOSIS — E876 Hypokalemia: Secondary | ICD-10-CM | POA: Diagnosis not present

## 2020-10-02 DIAGNOSIS — E1165 Type 2 diabetes mellitus with hyperglycemia: Secondary | ICD-10-CM

## 2020-10-02 DIAGNOSIS — Z794 Long term (current) use of insulin: Secondary | ICD-10-CM | POA: Diagnosis not present

## 2020-10-02 NOTE — Patient Instructions (Signed)
For Chemo take Lantus 10 in am and 20 in pm   Humalog 30-35 at meals then  Always take Humalog at start or end of meal not much later regardless of sugar level

## 2020-10-02 NOTE — Progress Notes (Signed)
Patient ID: Timothy Page, male   DOB: [birth year], 65 y.o.   MRN: [id number]            Reason for Appointment: Endocrinology follow-up    History of Present Illness:     DIABETES diagnosis date:  2014  Previous history: He had been on Metformin for most of the duration of the diabetes A1c had been usually between 6.2 and 7.2 until 08/2018  Most recent A1c is 8.3  Insulin regimen:    Lantus 20-22 units daily in a.m., Humalog 20-22 3 times daily  Recent history:     He has been monitoring with the freestyle libre and also trying to enter his insulin doses and his libre  Overall he has still significantly high blood sugars but only 39% within target Data for insulin has been entered with not consistent entry of mealtime insulin which can be 1-3 times a day and usually entered when the blood sugar has already increased significantly  He has been advised to take his Humalog consistently at the start of the meal but likely he is forgetting to do so with frequent high readings after meals especially in the evening  Although he appears to be taking his Lantus consistently every morning his overnight readings are highly variable Does not appear to have had excessive hypoglycemia with his chemotherapy infusion with Decadron 10 mg on 8/10 except somewhat high for over a day  Highest blood sugars have been on 8/16 and 8/14 for unknown reasons, possibly mixed insulin  He has still not increase his Humalog insulin significantly at the time of his hyperglycemia to more than about 22 units except on the day of chemotherapy Has had only 1 relatively low reading at midnight likely to be from late administration of his Humalog after dinner Previously Jardiance and metformin were stopped because he thought he was having side effects from [place]  Non-insulin hypoglycemic drugs: None           Side effects from medications:  Diarrhea from high-dose Metformin      Analysis of blood sugar patterns  from freestyle libre version 2 as follows  HIGHEST blood sugars are late morning and late evening with the highest between 8-10 PM  The degree of hyperglycemia is highly variable as before  He has a few days where he has persistently high sugars over about 304 1-3 days in a row  Most of his HYPERGLYCEMIA appears to be postprandial spikes which are most significant in the evenings after dinner but quite variable  OVERNIGHT blood sugars are inconsistent and variably high.   No hypoglycemia but only once or twice he has had low normal or slightly low readings around 1 AM     CGM use % of time 85  2-week average/GV 201+/-34  Time in range       39%  % Time Above 180 38+22  % Time above 250   % Time Below 70 1     PRE-MEAL Fasting Lunch Dinner Bedtime Overall  Glucose range:       Averages: 178   200    POST-MEAL PC Breakfast PC Lunch PC Dinner  Glucose range:     Averages: 177 197 220   Previously:  CGM use % of time 73  2-week average/GV 216+/-37  Time in range     40   %  % Time Above 180 30  % Time above 250 30  % Time Below 70  PRE-MEAL Fasting Lunch Dinner Bedtime Overall  Glucose range:       Averages: 186 197   216   POST-MEAL PC Breakfast PC Lunch PC Dinner  Glucose range:     Averages: 235 237 275    Weight control:  Wt Readings from Last 3 Encounters:  10/02/20 188 lb 3.2 oz (85.4 kg)  09/14/20 183 lb 4.8 oz (83.1 kg)  08/31/20 179 lb 8 oz (81.4 kg)   Lab Results  Component Value Date   HGBA1C 8.3 (A) 08/10/2020   HGBA1C 7.7 (H) 06/12/2020   HGBA1C 8.5 (H) 01/29/2020   Lab Results  Component Value Date   MICROALBUR 0.7 10/30/2019   LDLCALC 103 (H) 06/15/2020   CREATININE 0.84 09/23/2020   Lab Results  Component Value Date   FRUCTOSAMINE 333 (H) 04/17/2020     POSTSURGICAL HYPOTHYROIDISM:  Prior to surgery he probably had a history of goiter followed by his PCP In June 2018 patient noticed significant swelling of his neck along with  fever and also had cold symptoms Since he started having difficulty breathing he went to the emergency room and was hospitalized Since he had tracheal compression he had thyroidectomy done by 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  Patient was discharged on 150 g of levothyroxine However since TSH was 0.1 this was reduced down to 137 g on his initial consultation in 7/18  RECENT history:  He is very regular with taking his levothyroxine 137 mcg before breakfast daily with water  TSH is normal consistently even with his tendency to weight loss likely  Wt Readings from Last 3 Encounters:  10/02/20 188 lb 3.2 oz (85.4 kg)  09/14/20 183 lb 4.8 oz (83.1 kg)  08/31/20 179 lb 8 oz (81.4 kg)     Lab Results  Component Value Date   TSH 0.74 08/10/2020   TSH 1.13 04/02/2020   TSH 1.35 10/30/2019   FREET4 1.17 04/02/2020   FREET4 1.26 10/30/2019   FREET4 1.20 03/01/2019     Allergies as of 10/02/2020       Reactions   Atenolol    Severe "slowing"of functioning Severe "slowing"of functioning   Hydroxyzine Other (See Comments)   "felt like I was coming out of my skin"   Tetracycline    Rash Because of a history of documented adverse serious drug reaction;Medi Alert bracelet  is recommended Rash Because of a history of documented adverse serious drug reaction;Medi Alert bracelet  is recommended   Atorvastatin    D/Ced by him due to Myalgias in Sept 2014 D/Ced by him due to Myalgias in Sept 2014   Codeine    Hallucinations. "I see pink elephants"   Guaifenesin    Other reaction(s): Other (See Comments) unknown unknown   Mucinex [guaifenesin Er] Other (See Comments)   Hallucinations "I see pink elephants"        Medication List        Accurate as of October 02, 2020 11:59 PM. If you have any questions, ask your nurse or doctor.          apixaban 5 MG Tabs tablet Commonly known as: ELIQUIS Take 1 tablet (5 mg total) by mouth 2 (two) times  daily.   dexamethasone 4 MG tablet Commonly known as: DECADRON Take 2 tablets by mouth 2 (two) times daily.   FreeStyle Libre 2 Sensor Misc APPLY 1 SENSOR TO THE SKIN EVERY 14 DAYS AS DIRECTED   HumaLOG KwikPen 100 UNIT/ML KwikPen Generic  drug: insulin lispro Inject 20-22 Units into the skin daily.   Insulin Pen Needle 31G X 5 MM Misc Use with insulin pen 3 times a day   lactulose 10 GM/15ML solution Commonly known as: CHRONULAC Take 15 mLs (10 g total) by mouth 3 (three) times daily as needed for mild constipation.   Lantus SoloStar 100 UNIT/ML Solostar Pen Generic drug: insulin glargine Give up to 10 units under the skin every evening at bedtime. What changed:  how much to take how to take this when to take this additional instructions   levothyroxine 137 MCG tablet Commonly known as: SYNTHROID TAKE 1 TABLET BY MOUTH  DAILY BEFORE BREAKFAST   lipase/protease/amylase 36000 UNITS Cpep capsule Commonly known as: Creon Take 2 capsules (72,000 Units total) by mouth 3 (three) times daily with meals. May also take 1 capsule (36,000 Units total) as needed (with snacks).   LORazepam 0.5 MG tablet Commonly known as: Ativan Take 1 tablet (0.5 mg total) by mouth every 12 (twelve) hours as needed for anxiety.   magic mouthwash Soln SWISH WITH 30 MLS BY MOUTH FOUR TIMES DAILY AND SWALLOW. NO FOOD OR DRINK FOR 1 HOUR AFTERWARDS   morphine 15 MG 12 hr tablet Commonly known as: MS CONTIN Take 1 tablet (15 mg total) by mouth every 12 (twelve) hours. For cancer pain   omeprazole 20 MG capsule Commonly known as: PRILOSEC TAKE 1 CAPSULE(20 MG) BY MOUTH DAILY   ondansetron 8 MG tablet Commonly known as: Zofran Take 1 tablet (8 mg total) by mouth 2 (two) times daily as needed. Start on day 3 after chemotherapy.   oxyCODONE 5 MG immediate release tablet Commonly known as: Oxy IR/ROXICODONE Take 1 tablet (5 mg total) by mouth every 6 (six) hours as needed for severe pain or  moderate pain.   prochlorperazine 10 MG tablet Commonly known as: COMPAZINE TAKE 1 TABLET(10 MG) BY MOUTH EVERY 6 HOURS AS NEEDED FOR NAUSEA OR VOMITING   ramipril 10 MG capsule Commonly known as: ALTACE Take 10 mg by mouth 2 (two) times daily.   senna-docusate 8.6-50 MG tablet Commonly known as: Senokot-S Take 1 tablet by mouth 2 (two) times daily.        Allergies:  Allergies  Allergen Reactions   Atenolol     Severe "slowing"of functioning Severe "slowing"of functioning   Hydroxyzine Other (See Comments)    "felt like I was coming out of my skin"   Tetracycline     Rash Because of a history of documented adverse serious drug reaction;Medi Alert bracelet  is recommended Rash Because of a history of documented adverse serious drug reaction;Medi Alert bracelet  is recommended   Atorvastatin     D/Ced by him due to Myalgias in Sept 2014 D/Ced by him due to Myalgias in Sept 2014   Codeine     Hallucinations. "I see pink elephants"   Guaifenesin     Other reaction(s): Other (See Comments) unknown unknown   Mucinex [Guaifenesin Er] Other (See Comments)    Hallucinations "I see pink elephants"    Past Medical History:  Diagnosis Date   Arthritis    Atypical chest pain 04/2017   ACS ruled out 05/11/17   CAP (community acquired pneumonia) 05/07/2017   Chronic constipation    at least partially d/t chronic opioids   Diabetes mellitus without complication ([place])    Poor control starting fall 2021-->GAD ab neg.  Insulin and C peptide levels wnl but on lower end of normal->Dr Kuma/endo  2022.   Exocrine pancreatic insufficiency 2022   History of thyroidectomy, total 2018   Hurlthe cell adenoma   Hx of adenomatous polyp of colon 07/02/2009   rpt colonoscopy 2026   Hyperlipidemia    Intol of multiple statins and zetia. Advanced lipid clinic 2022.   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 Cammy Copa   Right ureteral calculus 04/2018   Cystoscopy with right retrograde pyelogram and right ureteral stent placement after stone removal.   Seasonal allergies    Stroke ([place])    in [id number] no complications    Tachyarrhythmia 1980   age 65 X 2 over 34 month period.  No recurrence since that time.   Thoracic aortic aneurysm ([facility])    Incidentally discovered, 4.1 cm-->Dr. Cyndia Bent. Rpt CT 08/2018 and 08/2019 stable.   Vocal cord paralysis 07/2016   Left vocal cord paralysis noted after thyroid surgery.    Past Surgical History:  Procedure Laterality Date   BIOPSY  06/18/2020   Procedure: BIOPSY;  Surgeon: Rush Landmark Telford Nab., MD;  Location: [facility] ENDOSCOPY;  Service: Gastroenterology;;   [place] W/ POLYPECTOMY  07/02/2009; 01/24/20   2011 adenoma.  2021 adenomas x 3->recall 2026   CYSTOSCOPY WITH RETROGRADE PYELOGRAM, URETEROSCOPY AND STENT PLACEMENT Right 05/09/2018   Procedure: CYSTOSCOPY WITH RIGHT RETROGRADE RIGHT URETEROSCOPY STONE EXTRACTION RIGHT STENT EXCHANGE;  Surgeon: Lucas Mallow, MD;  Location: [facility] ORS;  Service: Urology;  Laterality: Right;   CYSTOSCOPY/RETROGRADE/URETEROSCOPY/STONE EXTRACTION WITH BASKET Right 04/30/2018   Procedure: CYSTOSCOPY/RETROGRADE/RIGHT URETEROSCOPY/;  Surgeon: Lucas Mallow, MD;  Location: [facility] ORS;  Service: Urology;  Laterality: Right;   ESOPHAGOGASTRODUODENOSCOPY  07/02/2009   NORMAL   ESOPHAGOGASTRODUODENOSCOPY (EGD) WITH PROPOFOL N/A 06/18/2020   Procedure: ESOPHAGOGASTRODUODENOSCOPY (EGD) WITH PROPOFOL;  Surgeon: Rush Landmark Telford Nab., MD;  Location: [place];  Service:  Gastroenterology;  Laterality: N/A;   EYE SURGERY     FINE NEEDLE ASPIRATION  06/18/2020   Procedure: FINE NEEDLE ASPIRATION (FNA) LINEAR;  Surgeon: Irving Copas., MD;  Location: [place];  Service: Gastroenterology;;   finger amputaion     with reattachement   INCISION AND DRAINAGE ABSCESS ANAL  11/23/2019   gen surg (In ED).   IR IMAGING GUIDED PORT INSERTION  06/22/2020   LASIK     THYROIDECTOMY N/A 08/03/2016   Procedure: Total THYROIDECTOMY;  Surgeon: [name], MD;  Location: [place];  Service: ENT;  Laterality: N/A;  Total Thyroidectomy    UPPER ESOPHAGEAL ENDOSCOPIC ULTRASOUND (EUS) N/A 06/18/2020   Procedure: UPPER ESOPHAGEAL ENDOSCOPIC ULTRASOUND (EUS);  Surgeon: Irving Copas., MD;  Location: [place];  Service: Gastroenterology;  Laterality: N/A;   UPPER GASTROINTESTINAL ENDOSCOPY     with EUS and bx of pancreatic mass. +appearance of candida in esoph.      Family History  Problem Relation Age of Onset   Diabetes Mother    Cancer Mother        lung   COPD Mother    Brain cancer Mother    Arthritis  Mother    Hyperlipidemia Mother    Heart disease Mother    Hypertension Mother    Diabetes Father    Cancer Father        mesothelioma; skin   Mental illness Father    Arthritis Father    Hyperlipidemia Father    Heart disease Father    Hypertension Father    Heart disease Maternal Grandmother        MI @ 11   Heart disease Maternal Grandfather        MI @ 63   Diabetes Paternal Grandmother    Dementia Paternal Grandmother    Heart disease Paternal Grandfather        MI @ 65   Colon cancer Neg Hx    Esophageal cancer Neg Hx    Rectal cancer Neg Hx    Stomach cancer Neg Hx     Social History:  reports that he has never smoked. His smokeless tobacco use includes snuff. He reports that he does not drink alcohol and does not use drugs.    Review of Systems  HYPERTENSION: He is finally on ramipril 10 mg twice daily for persistently high  blood sugars Still has low potassium even with starting ramipril which he only did in early August Now managed by his PCP, also has a BP monitor at home   BP Readings from Last 3 Encounters:  10/02/20 (!) 142/82  09/25/20 (!) 156/68  09/23/20 (!) 160/83   Lab Results  Component Value Date   K 3.2 (L) 09/23/2020    Has started chemotherapy for metastatic prostate cancer    Examination:   BP (!) 142/82   Pulse 79   Ht '5\' 9"'$  (1.753 m)   Wt 188 lb 3.2 oz (85.4 kg)   SpO2 98%   BMI 27.79 kg/m      Assessment/Plan:   DIABETES type II  He is currently on a basal bolus insulin  His A1c is 8.3  He is on basal bolus insulin and continues to have significant hyperglycemia with blood sugars recently averaging nearly 200 and only 39% within target on his CGM  However because of his known stage IV pancreatic carcinoma does not need aggressive management His main difficulty is not taking Humalog proactively when he needs to cover his meals and discussed importance of coordinating the insulin action with his mealtime hyperglycemia  Recommendations: He can go up to 35 units on Humalog when he has chemotherapy and high sugars but also go up to about 25-30 units when he is eating a full meal  May take Humalog right after finishing eating but not later  Cautioned him about potential for hypoglycemia if he takes Humalog more than 20 minutes late  No change in Lantus unless his morning sugars are consistently high and may go up another 2 to 4 units  Balanced meals with some protein at each meal   LANTUS will need to be titrated to 10 in am and 20 in pm   He will make sure he takes his insulin when he starts eating and not wait till he has a high blood sugar Ensure adequate fluid intake at all times  Hypothyroidism: To have TSH checked again on the next visit  Hypokalemia: He will have his labs done again on Monday, has had low potassium although he has recently been started on  ramipril 20 mg a day   Patient Instructions  For Chemo take Lantus 10 in am and 20 in  pm   Humalog 30-35 at meals then  Always take Humalog at start or end of meal not much later regardless of sugar level   Elayne Snare 10/04/2020  Addendum: TSH normal

## 2020-10-06 ENCOUNTER — Other Ambulatory Visit: Payer: Self-pay

## 2020-10-06 ENCOUNTER — Inpatient Hospital Stay: Payer: 59

## 2020-10-06 ENCOUNTER — Encounter: Payer: Self-pay | Admitting: Hematology and Oncology

## 2020-10-06 ENCOUNTER — Inpatient Hospital Stay (HOSPITAL_BASED_OUTPATIENT_CLINIC_OR_DEPARTMENT_OTHER): Payer: 59 | Admitting: Hematology and Oncology

## 2020-10-06 VITALS — BP 152/89 | HR 65 | Temp 97.9°F | Resp 18 | Ht 69.0 in | Wt 186.9 lb

## 2020-10-06 DIAGNOSIS — Z5111 Encounter for antineoplastic chemotherapy: Secondary | ICD-10-CM | POA: Diagnosis not present

## 2020-10-06 DIAGNOSIS — G893 Neoplasm related pain (acute) (chronic): Secondary | ICD-10-CM | POA: Diagnosis not present

## 2020-10-06 DIAGNOSIS — K5903 Drug induced constipation: Secondary | ICD-10-CM | POA: Diagnosis not present

## 2020-10-06 DIAGNOSIS — C259 Malignant neoplasm of pancreas, unspecified: Secondary | ICD-10-CM

## 2020-10-06 DIAGNOSIS — Z95828 Presence of other vascular implants and grafts: Secondary | ICD-10-CM

## 2020-10-06 DIAGNOSIS — D696 Thrombocytopenia, unspecified: Secondary | ICD-10-CM

## 2020-10-06 LAB — CMP (CANCER CENTER ONLY)
ALT: 15 U/L (ref 0–44)
AST: 12 U/L — ABNORMAL LOW (ref 15–41)
Albumin: 3.4 g/dL — ABNORMAL LOW (ref 3.5–5.0)
Alkaline Phosphatase: 64 U/L (ref 38–126)
Anion gap: 8 (ref 5–15)
BUN: 11 mg/dL (ref 8–23)
CO2: 24 mmol/L (ref 22–32)
Calcium: 8.7 mg/dL — ABNORMAL LOW (ref 8.9–10.3)
Chloride: 111 mmol/L (ref 98–111)
Creatinine: 1.03 mg/dL (ref 0.61–1.24)
GFR, Estimated: >60 mL/min (ref >60)
Glucose, Bld: 169 mg/dL — ABNORMAL HIGH (ref 70–99)
Potassium: 3.7 mmol/L (ref 3.5–5.1)
Sodium: 143 mmol/L (ref 135–145)
Total Bilirubin: 0.6 mg/dL (ref 0.3–1.2)
Total Protein: 5.9 g/dL — ABNORMAL LOW (ref 6.5–8.1)

## 2020-10-06 LAB — CBC WITH DIFFERENTIAL/PLATELET
Abs Immature Granulocytes: 0 10*3/uL (ref 0.00–0.07)
Basophils Absolute: 0 10*3/uL (ref 0.0–0.1)
Basophils Relative: 1 %
Eosinophils Absolute: 0.1 10*3/uL (ref 0.0–0.5)
Eosinophils Relative: 2 %
HCT: 33 % — ABNORMAL LOW (ref 39.0–52.0)
Hemoglobin: 11.1 g/dL — ABNORMAL LOW (ref 13.0–17.0)
Immature Granulocytes: 0 %
Lymphocytes Relative: 27 %
Lymphs Abs: 1 10*3/uL (ref 0.7–4.0)
MCH: 32.1 pg (ref 26.0–34.0)
MCHC: 33.6 g/dL (ref 30.0–36.0)
MCV: 95.4 fL (ref 80.0–100.0)
Monocytes Absolute: 0.2 10*3/uL (ref 0.1–1.0)
Monocytes Relative: 7 %
Neutro Abs: 2.2 10*3/uL (ref 1.7–7.7)
Neutrophils Relative %: 63 %
Platelets: 90 10*3/uL — ABNORMAL LOW (ref 150–400)
RBC: 3.46 MIL/uL — ABNORMAL LOW (ref 4.22–5.81)
RDW: 16 % — ABNORMAL HIGH (ref 11.5–15.5)
WBC: 3.5 10*3/uL — ABNORMAL LOW (ref 4.0–10.5)
nRBC: 0 % (ref 0.0–0.2)

## 2020-10-06 MED ORDER — LEUCOVORIN CALCIUM INJECTION 350 MG
400.0000 mg/m2 | Freq: Once | INTRAVENOUS | Status: AC
  Administered 2020-10-06: 812 mg via INTRAVENOUS
  Filled 2020-10-06: qty 40.6

## 2020-10-06 MED ORDER — PALONOSETRON HCL INJECTION 0.25 MG/5ML
0.2500 mg | Freq: Once | INTRAVENOUS | Status: AC
  Administered 2020-10-06: 0.25 mg via INTRAVENOUS
  Filled 2020-10-06: qty 5

## 2020-10-06 MED ORDER — DEXTROSE 5 % IV SOLN: Freq: Once | INTRAVENOUS | Status: AC

## 2020-10-06 MED ORDER — FLUOROURACIL CHEMO INJECTION 2.5 GM/50ML
400.0000 mg/m2 | Freq: Once | INTRAVENOUS | Status: AC
  Administered 2020-10-06: 800 mg via INTRAVENOUS
  Filled 2020-10-06: qty 16

## 2020-10-06 MED ORDER — DEXTROSE 5 % IV SOLN
40.0000 mg/m2 | Freq: Once | INTRAVENOUS | Status: AC
  Administered 2020-10-06: 80 mg via INTRAVENOUS
  Filled 2020-10-06: qty 16

## 2020-10-06 MED ORDER — LACTULOSE 10 GM/15ML PO SOLN: 10.0000 g | Freq: Three times a day (TID) | ORAL | 0 refills | Status: DC | PRN

## 2020-10-06 MED ORDER — SODIUM CHLORIDE 0.9 % IV SOLN
2400.0000 mg/m2 | INTRAVENOUS | Status: DC
  Administered 2020-10-06: 4850 mg via INTRAVENOUS
  Filled 2020-10-06: qty 97

## 2020-10-06 MED ORDER — SODIUM CHLORIDE 0.9 % IV SOLN
150.0000 mg | Freq: Once | INTRAVENOUS | Status: AC
  Administered 2020-10-06: 150 mg via INTRAVENOUS
  Filled 2020-10-06: qty 150

## 2020-10-06 MED ORDER — SODIUM CHLORIDE 0.9 % IV SOLN
10.0000 mg | Freq: Once | INTRAVENOUS | Status: AC
  Administered 2020-10-06: 10 mg via INTRAVENOUS
  Filled 2020-10-06: qty 10

## 2020-10-06 MED ORDER — SODIUM CHLORIDE 0.9% FLUSH
10.0000 mL | Freq: Once | INTRAVENOUS | Status: AC
  Administered 2020-10-06: 10 mL

## 2020-10-06 NOTE — Assessment & Plan Note (Signed)
This is a very pleasant 65 year old male patient with newly diagnosed metastatic pancreatic adenocarcinoma, initially started on FOLFIRINOX but did not tolerate well and hence the regimen has been switched to FOLFOX for which she is currently here for follow-up.  He will be getting his planned cycle 7 of FOLFOX.  Review of systems pertinent for ongoing fatigue, constipation and some neuropathy. Physical examination he appears to be in no distress, abdominal tenderness as before in the left lumbar and left upper quadrant region.  No guarding or rigidity or rebound tenderness. We have reviewed his plan, I have discussed about reducing the dose of oxaliplatin versus eliminating it.  I have clearly mentioned that the neuropathy can become permanent and can be disabling if we continue to proceed with oxaliplatin.  He is however willing to try 1 more cycle at a slightly lower dose and if he continues to have these complaints, we will remove oxaliplatin from his treatment. Anticipate repeating imaging after cycle 8, this has been ordered for mid September.

## 2020-10-06 NOTE — Patient Instructions (Signed)
[facility]  Discharge Instructions: Thank you for choosing [facility] to provide your oncology and hematology care.   If you have a lab appointment with the [facility], please go directly to the [facility] and check in at the registration area.   Wear comfortable clothing and clothing appropriate for easy access to any Portacath or PICC line.   We strive to give you quality time with your provider. You may need to reschedule your appointment if you arrive late (15 or more minutes).  Arriving late affects you and other patients whose appointments are after yours.  Also, if you miss three or more appointments without notifying the office, you may be dismissed from the clinic at the provider's discretion.      For prescription refill requests, have your pharmacy contact our office and allow 72 hours for refills to be completed.    Today you received the following chemotherapy and/or immunotherapy agents oxaliplatin, leucovorin, fluorourcil.      To help prevent nausea and vomiting after your treatment, we encourage you to take your nausea medication as directed.  BELOW ARE SYMPTOMS THAT SHOULD BE REPORTED IMMEDIATELY: *FEVER GREATER THAN 100.4 F (38 C) OR HIGHER *CHILLS OR SWEATING *NAUSEA AND VOMITING THAT IS NOT CONTROLLED WITH YOUR NAUSEA MEDICATION *UNUSUAL SHORTNESS OF BREATH *UNUSUAL BRUISING OR BLEEDING *URINARY PROBLEMS (pain or burning when urinating, or frequent urination) *BOWEL PROBLEMS (unusual diarrhea, constipation, pain near the anus) TENDERNESS IN MOUTH AND THROAT WITH OR WITHOUT PRESENCE OF ULCERS (sore throat, sores in mouth, or a toothache) UNUSUAL RASH, SWELLING OR PAIN  UNUSUAL VAGINAL DISCHARGE OR ITCHING   Items with * indicate a potential emergency and should be followed up as soon as possible or go to the Emergency Department if any problems should occur.  Please show the CHEMOTHERAPY ALERT CARD or IMMUNOTHERAPY  ALERT CARD at check-in to the Emergency Department and triage nurse.  Should you have questions after your visit or need to cancel or reschedule your appointment, please contact [facility]  Dept: [phone]  and follow the prompts.  Office hours are 8:00 a.m. to 4:30 p.m. Monday - Friday. Please note that voicemails left after 4:00 p.m. may not be returned until the following business day.  We are closed weekends and major holidays. You have access to a nurse at all times for urgent questions. Please call the main number to the clinic Dept: [phone] and follow the prompts.   For any non-urgent questions, you may also contact your provider using MyChart. We now offer e-Visits for anyone 64 and older to request care online for non-urgent symptoms. For details visit [email or web address].   Also download the MyChart app! Go to the app store, search "MyChart", open the app, select , and log in with your MyChart username and password.  Due to Covid, a mask is required upon entering the hospital/clinic. If you do not have a mask, one will be given to you upon arrival. For doctor visits, patients may have 1 support person aged 64 or older with them. For treatment visits, patients cannot have anyone with them due to current Covid guidelines and our immunocompromised population.

## 2020-10-06 NOTE — Assessment & Plan Note (Signed)
This is a very pleasant 65 year old male patient with pancreatic adenocarcinoma, stage IV on FOLFOX chemotherapy status post 6 cycles of chemotherapy and last imaging showing stable disease who is here for cycle 7 of planned chemotherapy. Thrombocytopenia likely secondary to chemotherapy, borderline to proceed.  Will proceed with treatment as planned today and continue to monitor.  Moreover his chemotherapy dose has been further reduced because of neuropathy issues hence thrombocytopenia may be less prominent for his next cycle.  He understands the risk of bleeding and bruising with severe thrombocytopenia and he agrees to proceed.

## 2020-10-06 NOTE — Progress Notes (Signed)
Per Dr Chryl Heck, ok to treat with platelets of 90.

## 2020-10-06 NOTE — Assessment & Plan Note (Signed)
On as needed oxycodone once or twice a week.  This has remarkably improved since initiation of treatment when he needed much higher doses of narcotics.  We will continue to monitor his cancer related pain.

## 2020-10-06 NOTE — Assessment & Plan Note (Signed)
He continues to complain of drug-induced constipation.  Advised to use Colace and MiraLAX on a daily basis and use lactulose if he does not have a bowel movement for 48 hours despite using Colace and MiraLAX.  He acknowledged.  There is no evidence of any obstruction at this time.  We will continue to monitor the symptom.

## 2020-10-06 NOTE — Progress Notes (Signed)
[place] FOLLOW UP NOTE  Patient Care Team: [name], MD as PCP - General (Hematology and Oncology) Gatha Mayer, MD as Consulting Physician (Gastroenterology) [name], MD as Consulting Physician (Otolaryngology) Elayne Snare, MD as Consulting Physician (Endocrinology) Gaye Pollack, MD as Consulting Physician (Cardiothoracic Surgery) Lucas Mallow, MD as Consulting Physician (Urology) Debara Pickett Nadean Corwin, MD as Consulting Physician (Cardiology) [name], MD as Consulting Physician (Hematology and Oncology) Jonnie Finner, RN (Inactive) as Oncology Nurse Navigator  CHIEF COMPLAINTS/PURPOSE OF CONSULTATION:   Pancreatic cancer, follow up prior to C7 of FOLFOX.  ASSESSMENT & PLAN:   Adenocarcinoma of pancreas, stage 4 ([place]) This is a very pleasant 65 year old male patient with newly diagnosed metastatic pancreatic adenocarcinoma, initially started on FOLFIRINOX but did not tolerate well and hence the regimen has been switched to FOLFOX for which she is currently here for follow-up.  He will be getting his planned cycle 7 of FOLFOX.  Review of systems pertinent for ongoing fatigue, constipation and some neuropathy. Physical examination he appears to be in no distress, abdominal tenderness as before in the left lumbar and left upper quadrant region.  No guarding or rigidity or rebound tenderness. We have reviewed his plan, I have discussed about reducing the dose of oxaliplatin versus eliminating it.  I have clearly mentioned that the neuropathy can become permanent and can be disabling if we continue to proceed with oxaliplatin.  He is however willing to try 1 more cycle at a slightly lower dose and if he continues to have these complaints, we will remove oxaliplatin from his treatment. Anticipate repeating imaging after cycle 8, this has been ordered for mid September.  Thrombocytopenia ([place]) This is a very pleasant 65 year old male patient with  pancreatic adenocarcinoma, stage IV on FOLFOX chemotherapy status post 6 cycles of chemotherapy and last imaging showing stable disease who is here for cycle 7 of planned chemotherapy. Thrombocytopenia likely secondary to chemotherapy, borderline to proceed.  Will proceed with treatment as planned today and continue to monitor.  Moreover his chemotherapy dose has been further reduced because of neuropathy issues hence thrombocytopenia may be less prominent for his next cycle.  He understands the risk of bleeding and bruising with severe thrombocytopenia and he agrees to proceed.  Cancer related pain On as needed oxycodone once or twice a week.  This has remarkably improved since initiation of treatment when he needed much higher doses of narcotics.  We will continue to monitor his cancer related pain.  Drug induced constipation He continues to complain of drug-induced constipation.  Advised to use Colace and MiraLAX on a daily basis and use lactulose if he does not have a bowel movement for 48 hours despite using Colace and MiraLAX.  He acknowledged.  There is no evidence of any obstruction at this time.  We will continue to monitor the symptom.  Orders Placed This Encounter  Procedures   CT CHEST ABDOMEN PELVIS W CONTRAST    Standing Status:   Future    Standing Expiration Date:   10/06/2021    Order Specific Question:   Preferred imaging location?    Answer:   [facility]    Order Specific Question:   Radiology Contrast Protocol - do NOT remove file path    Answer:   [email or web address]     HISTORY OF PRESENTING ILLNESS:   Timothy Page 65 y.o. male is here because of new diagnosis of pancreatic cancer.  Oncology History  Adenocarcinoma  of pancreas, stage 4 ([place])  06/20/2020 Initial Diagnosis   Adenocarcinoma of pancreas, stage 4 ([place])   07/01/2020 -  Chemotherapy    Patient is on Treatment Plan: PANCREAS - FOLFOX Q14 DAYS        07/01/2020 Genetic Testing   Negative genetic testing on a custom panel reviewing multicancer and pancreatitis genes.  The Custom-Gene Panel offered by [facility] includes sequencing and/or deletion duplication testing of the following 91 genes: AIP, ALK, APC*, ATM*, AXIN2, BAP1, BARD1, BLM, BMPR1A, BRCA1, BRCA2, BRIP1, CASR, CDC73, CDH1, CDK4, CDKN1B, CDKN1C, CDKN2A (p14ARF), CDKN2A (p16INK4a), CEBPA, CFTR*, CHEK2, CPA1, CTNNA1, CTRC, DICER1*, DIS3L2, EGFR, EPCAM*, FANCC, FH*, FLCN, GATA2, GPC3*, GREM1*, HOXB13, HRAS, KIT, MAX*, MEN1*, MET*, MITF*, MLH1*, MSH2*, MSH3*, MSH6*, MUTYH, NBN, NF1*, NF2, NTHL1, PALB2, PALLD, PDGFRA, PHOX2B*, PMS2*, POLD1*, POLE, POT1, PRKAR1A, PRSS1*, PTCH1, PTEN*, RAD50, RAD51C, RAD51D, RB1*, RECQL4*, RET, RUNX1, SDHA*, SDHAF2, SDHB, SDHC*, SDHD, SMAD4, SMARCA4, SMARCB1, SMARCE1, SPINK1, STK11, SUFU, TERC, TERT, TMEM127, TP53, TSC1*, TSC2, VHL, WRN*, and WT1.  The report date is Jul 01, 2020.    Imaging   Imaging after 4 cycles of therapy  IMPRESSION: 1. Pancreatic neck mass is stable. 2. No new or progressive metastatic disease. Tiny soft tissue nodule along the posteroinferior right liver capsule is stable. Solitary liver metastasis adjacent to the gallbladder is mildly decreased. Small bilateral pulmonary nodules are all stable. 3. New small nonocclusive thrombosis in the main portal vein. Persistent near occlusion of the proximal main portal vein with narrowing of the portal splenic venous confluence. 4. Aortic Atherosclerosis (ICD10-I70.0).    INTERIM HISTORY  Timothy Page is here for a follow-up before cycle 7 He is here by himself for his chemotherapy appointment.  Since last visit, he continues to complain of ongoing constipation and some numbness in his tongue and his fingers and toes.  With regards to constipation, he has a bowel movement about once every 3 to 7 days.  He has only been taking laxatives and stool softeners as needed.  No nausea or vomiting or  unusual abdominal pain. With regards to his numbness, he describes that his tongue feels like there are needles in it and his fingers and toes were also numb and painful at times.  They are most intense for the first few days after chemo but then they do not quite limit him from doing any activities of daily living.  He reports no falls, dropping things and he is able to button his shirts and comb his hair. He understands this could be a side effect from oxaliplatin but he is not quite willing to de-escalate yet. With regards to abdominal pain, his pain has been very well controlled, has only been taking oxycodone once or twice a week. No change in breathing or urinary habits.  Rest of the pertinent 10 point ROS reviewed and negative.  MEDICAL HISTORY:  Past Medical History:  Diagnosis Date   Arthritis    Atypical chest pain 04/2017   ACS ruled out 05/11/17   CAP (community acquired pneumonia) 05/07/2017   Chronic constipation    at least partially d/t chronic opioids   Diabetes mellitus without complication ([place])    Poor control starting fall 2021-->GAD ab neg.  Insulin and C peptide levels wnl but on lower end of normal->Dr Kuma/endo 2022.   Exocrine pancreatic insufficiency 2022   History of thyroidectomy, total 2018   Hurlthe cell adenoma   Hx of adenomatous polyp of colon 07/02/2009   rpt colonoscopy 2026  Hyperlipidemia    Intol of multiple statins and zetia. Advanced lipid clinic 2022.   Hypertension    Lipoma    back   Myocardial infarction [facility])    MI at age 64   Pancreatic adenocarcinoma ([place]) 06/2020   Mets to liver (?and lungs?->stage 4), w/acute pancreatitis.   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 Cammy Copa   Right ureteral calculus 04/2018    Cystoscopy with right retrograde pyelogram and right ureteral stent placement after stone removal.   Seasonal allergies    Stroke ([place])    in 83J no complications    Tachyarrhythmia 1980   age 10 X 2 over 34 month period.  No recurrence since that time.   Thoracic aortic aneurysm ([facility])    Incidentally discovered, 4.1 cm-->Dr. Cyndia Bent. Rpt CT 08/2018 and 08/2019 stable.   Vocal cord paralysis 07/2016   Left vocal cord paralysis noted after thyroid surgery.    SURGICAL HISTORY: Past Surgical History:  Procedure Laterality Date   BIOPSY  06/18/2020   Procedure: BIOPSY;  Surgeon: Mansouraty, Telford Nab., MD;  Location: [facility] ENDOSCOPY;  Service: Gastroenterology;;   [place] W/ POLYPECTOMY  07/02/2009; 01/24/20   2011 adenoma.  2021 adenomas x 3->recall 2026   CYSTOSCOPY WITH RETROGRADE PYELOGRAM, URETEROSCOPY AND STENT PLACEMENT Right 05/09/2018   Procedure: CYSTOSCOPY WITH RIGHT RETROGRADE RIGHT URETEROSCOPY STONE EXTRACTION RIGHT STENT EXCHANGE;  Surgeon: Lucas Mallow, MD;  Location: [facility] ORS;  Service: Urology;  Laterality: Right;   CYSTOSCOPY/RETROGRADE/URETEROSCOPY/STONE EXTRACTION WITH BASKET Right 04/30/2018   Procedure: CYSTOSCOPY/RETROGRADE/RIGHT URETEROSCOPY/;  Surgeon: Lucas Mallow, MD;  Location: [facility] ORS;  Service: Urology;  Laterality: Right;   ESOPHAGOGASTRODUODENOSCOPY  07/02/2009   NORMAL   ESOPHAGOGASTRODUODENOSCOPY (EGD) WITH PROPOFOL N/A 06/18/2020   Procedure: ESOPHAGOGASTRODUODENOSCOPY (EGD) WITH PROPOFOL;  Surgeon: Rush Landmark Telford Nab., MD;  Location: [facility];  Service: Gastroenterology;  Laterality: N/A;   EYE SURGERY     FINE NEEDLE ASPIRATION  06/18/2020   Procedure: FINE NEEDLE ASPIRATION (FNA) LINEAR;  Surgeon: Irving Copas., MD;  Location: [facility];  Service: Gastroenterology;;   finger amputaion     with reattachement   INCISION AND DRAINAGE ABSCESS ANAL  11/23/2019   gen surg (In ED).   IR IMAGING GUIDED  PORT INSERTION  06/22/2020   LASIK     THYROIDECTOMY N/A 08/03/2016   Procedure: Total THYROIDECTOMY;  Surgeon: [name], MD;  Location: [facility];  Service: ENT;  Laterality: N/A;  Total Thyroidectomy    UPPER ESOPHAGEAL ENDOSCOPIC ULTRASOUND (EUS) N/A 06/18/2020   Procedure: UPPER ESOPHAGEAL ENDOSCOPIC ULTRASOUND (EUS);  Surgeon: Irving Copas., MD;  Location: [facility];  Service: Gastroenterology;  Laterality: N/A;   UPPER GASTROINTESTINAL ENDOSCOPY     with EUS and bx of pancreatic mass. +appearance of candida in esoph.      SOCIAL HISTORY: Social History   Socioeconomic History   Marital status: Married    Spouse name: Not on file   Number of children: Not on file   Years of education: Not on file   Highest education level: Not on file  Occupational History   Not on file  Tobacco Use   Smoking status: Never   Smokeless tobacco: Current    Types: Snuff  Vaping Use   Vaping Use: Never used  Substance and Sexual Activity   Alcohol use: No   Drug use: No   Sexual activity: Not on file  Other Topics Concern   Not on file  Social History Narrative   Married, 2 daughters, 1 grandson that lives with him.   Educ: some college   Occup: retired from the [employer].  Retired at 5 but returned to work at some point, now with grandson living with him.   No T/A/Ds.   Social Determinants of Health   Financial Resource Strain: Not on file  Food Insecurity: Not on file  Transportation Needs: Not on file  Physical Activity: Not on file  Stress: Not on file  Social Connections: Not on file  Intimate Partner Violence: Not on file    FAMILY HISTORY: Family History  Problem Relation Age of Onset   Diabetes Mother    Cancer Mother        lung   COPD Mother    Brain cancer Mother    Arthritis Mother    Hyperlipidemia Mother    Heart disease Mother    Hypertension Mother    Diabetes Father    Cancer Father        mesothelioma; skin   Mental illness Father     Arthritis Father    Hyperlipidemia Father    Heart disease Father    Hypertension Father    Heart disease Maternal Grandmother        MI @ 74   Heart disease Maternal Grandfather        MI @ 32   Diabetes Paternal Grandmother    Dementia Paternal Grandmother    Heart disease Paternal Grandfather        MI @ 29   Colon cancer Neg Hx    Esophageal cancer Neg Hx    Rectal cancer Neg Hx    Stomach cancer Neg Hx     ALLERGIES:  is allergic to atenolol, hydroxyzine, tetracycline, atorvastatin, codeine, guaifenesin, and mucinex [guaifenesin er].  MEDICATIONS:  Current Outpatient Medications  Medication Sig Dispense Refill   apixaban (ELIQUIS) 5 MG TABS tablet Take 1 tablet (5 mg total) by mouth 2 (two) times daily. 60 tablet 1   Continuous Blood Gluc Sensor (FREESTYLE LIBRE 2 SENSOR) MISC APPLY 1 SENSOR TO THE SKIN EVERY 14 DAYS AS DIRECTED 2 each 3   dexamethasone (DECADRON) 4 MG tablet Take 2 tablets by mouth 2 (two) times daily.     docusate sodium (COLACE) 100 MG capsule Take 100 mg by mouth 2 (two) times daily.     HUMALOG KWIKPEN 100 UNIT/ML KwikPen Inject 20-22 Units into the skin daily.     insulin glargine (LANTUS SOLOSTAR) 100 UNIT/ML Solostar Pen Give up to 10 units under the skin every evening at bedtime. (Patient taking differently: Inject 20 Units into the skin at bedtime.) 15 mL 1   Insulin Pen Needle 31G X 5 MM MISC Use with insulin pen 3 times a day 100 each 1   levothyroxine (SYNTHROID) 137 MCG tablet TAKE 1 TABLET BY MOUTH  DAILY BEFORE BREAKFAST (Patient taking differently: Take 137 mcg by mouth daily before breakfast.) 90 tablet 3   lipase/protease/amylase (CREON) 36000 UNITS CPEP capsule Take 2 capsules (72,000 Units total) by mouth 3 (three) times daily with meals. May also take 1 capsule (36,000 Units total) as needed (with snacks). 240 capsule 11   LORazepam (ATIVAN) 0.5 MG tablet Take 1 tablet (0.5 mg  total) by mouth every 12 (twelve) hours as needed for anxiety. 30  tablet 0   magic mouthwash SOLN SWISH WITH 30 MLS BY MOUTH FOUR TIMES DAILY AND SWALLOW. NO FOOD OR DRINK FOR 1 HOUR AFTERWARDS     magnesium hydroxide (MILK OF MAGNESIA) 400 MG/5ML suspension Take by mouth daily as needed for mild constipation.     morphine (MS CONTIN) 15 MG 12 hr tablet Take 1 tablet (15 mg total) by mouth every 12 (twelve) hours. For cancer pain 60 tablet 0   omeprazole (PRILOSEC) 20 MG capsule TAKE 1 CAPSULE(20 MG) BY MOUTH DAILY 30 capsule 0   ondansetron (ZOFRAN) 8 MG tablet Take 1 tablet (8 mg total) by mouth 2 (two) times daily as needed. Start on day 3 after chemotherapy. 30 tablet 1   oxyCODONE (OXY IR/ROXICODONE) 5 MG immediate release tablet Take 1 tablet (5 mg total) by mouth every 6 (six) hours as needed for severe pain or moderate pain. 60 tablet 0   polyethylene glycol (MIRALAX / GLYCOLAX) 17 g packet Take 17 g by mouth daily.     prochlorperazine (COMPAZINE) 10 MG tablet TAKE 1 TABLET(10 MG) BY MOUTH EVERY 6 HOURS AS NEEDED FOR NAUSEA OR VOMITING 30 tablet 1   ramipril (ALTACE) 10 MG capsule Take 10 mg by mouth 2 (two) times daily.     senna-docusate (SENOKOT-S) 8.6-50 MG tablet Take 1 tablet by mouth 2 (two) times daily.     lactulose (CHRONULAC) 10 GM/15ML solution Take 15 mLs (10 g total) by mouth 3 (three) times daily as needed for mild constipation. 236 mL 0   No current facility-administered medications for this visit.   Facility-Administered Medications Ordered in Other Visits  Medication Dose Route Frequency Provider Last Rate Last Admin   LORazepam (ATIVAN) tablet 0.5 mg  0.5 mg Oral Once Truitt Merle, MD        PHYSICAL EXAMINATION: ECOG PERFORMANCE STATUS: 1 - Symptomatic but completely ambulatory  Physical Exam Constitutional:      General: He is not in acute distress.    Appearance: Normal appearance.  HENT:     Head: Normocephalic and atraumatic.  Cardiovascular:     Rate and Rhythm: Normal rate and regular rhythm.     Pulses: Normal pulses.      Heart sounds: Normal heart sounds.  Pulmonary:     Effort: Pulmonary effort is normal.     Breath sounds: Normal breath sounds.  Abdominal:     General: Abdomen is flat. Bowel sounds are normal.     Palpations: Abdomen is soft.     Tenderness: There is abdominal tenderness (Left upper quadrant and left lumbar region tenderness, no guarding or rigidity). There is no right CVA tenderness or guarding.  Musculoskeletal:        General: No swelling or tenderness. Normal range of motion.     Cervical back: Normal range of motion and neck supple.  Lymphadenopathy:     Cervical: No cervical adenopathy.  Skin:    General: Skin is warm and dry.  Neurological:     General: No focal deficit present.     Mental Status: He is alert.  Psychiatric:        Mood and Affect: Mood normal.        Behavior: Behavior normal.    LABORATORY DATA:  I have reviewed the data as listed Lab Results  Component Value Date   WBC 3.5 (L) 10/06/2020   HGB 11.1 (L) 10/06/2020   HCT  33.0 (L) 10/06/2020   MCV 95.4 10/06/2020   PLT 90 (L) 10/06/2020     Chemistry      Component Value Date/Time   NA 142 09/23/2020 0808   K 3.2 (L) 09/23/2020 0808   CL 107 09/23/2020 0808   CO2 26 09/23/2020 0808   BUN 10 09/23/2020 0808   CREATININE 0.84 09/23/2020 0808   CREATININE 1.11 11/03/2017 1533      Component Value Date/Time   CALCIUM 8.7 (L) 09/23/2020 0808   ALKPHOS 71 09/23/2020 0808   AST 13 (L) 09/23/2020 0808   ALT 10 09/23/2020 0808   BILITOT 0.5 09/23/2020 0808      RADIOGRAPHIC STUDIES: I have personally reviewed the radiological images as listed and agreed with the findings in the report. No results found.  All questions were answered. The patient knows to call the clinic with any problems, questions or concerns.  I spent 30 minutes in the care of this patient including history and physical, review of records, counseling and coordination of care.   [name], MD 10/06/2020 10:14  AM

## 2020-10-07 ENCOUNTER — Other Ambulatory Visit: Payer: Self-pay | Admitting: Hematology and Oncology

## 2020-10-07 DIAGNOSIS — C259 Malignant neoplasm of pancreas, unspecified: Secondary | ICD-10-CM

## 2020-10-07 MED ORDER — LORAZEPAM 0.5 MG PO TABS: 0.5000 mg | ORAL_TABLET | Freq: Two times a day (BID) | ORAL | 0 refills | Status: DC | PRN

## 2020-10-08 ENCOUNTER — Other Ambulatory Visit: Payer: Self-pay

## 2020-10-08 ENCOUNTER — Inpatient Hospital Stay: Payer: 59

## 2020-10-08 VITALS — BP 151/91 | HR 68 | Resp 18

## 2020-10-08 DIAGNOSIS — C259 Malignant neoplasm of pancreas, unspecified: Secondary | ICD-10-CM

## 2020-10-08 DIAGNOSIS — Z5111 Encounter for antineoplastic chemotherapy: Secondary | ICD-10-CM | POA: Diagnosis not present

## 2020-10-08 MED ORDER — HEPARIN SOD (PORK) LOCK FLUSH 100 UNIT/ML IV SOLN
500.0000 [IU] | Freq: Once | INTRAVENOUS | Status: AC | PRN
  Administered 2020-10-08: 500 [IU]

## 2020-10-08 MED ORDER — SODIUM CHLORIDE 0.9% FLUSH
10.0000 mL | INTRAVENOUS | Status: DC | PRN
  Administered 2020-10-08: 10 mL

## 2020-10-12 ENCOUNTER — Other Ambulatory Visit: Payer: Self-pay | Admitting: *Deleted

## 2020-10-12 ENCOUNTER — Other Ambulatory Visit: Payer: 59

## 2020-10-12 ENCOUNTER — Ambulatory Visit: Payer: 59 | Admitting: Hematology and Oncology

## 2020-10-12 ENCOUNTER — Ambulatory Visit: Payer: 59

## 2020-10-12 DIAGNOSIS — C259 Malignant neoplasm of pancreas, unspecified: Secondary | ICD-10-CM

## 2020-10-12 MED ORDER — PROCHLORPERAZINE MALEATE 10 MG PO TABS: ORAL_TABLET | ORAL | 1 refills | Status: DC

## 2020-10-13 ENCOUNTER — Telehealth: Payer: Self-pay | Admitting: Hematology and Oncology

## 2020-10-13 NOTE — Telephone Encounter (Signed)
Called patient regarding upcoming appointments, left a voicemail. 

## 2020-10-15 ENCOUNTER — Other Ambulatory Visit: Payer: 59

## 2020-10-15 ENCOUNTER — Ambulatory Visit: Payer: 59

## 2020-10-15 ENCOUNTER — Ambulatory Visit: Payer: 59 | Admitting: Hematology and Oncology

## 2020-10-21 ENCOUNTER — Inpatient Hospital Stay: Payer: 59

## 2020-10-21 ENCOUNTER — Other Ambulatory Visit: Payer: Self-pay

## 2020-10-21 ENCOUNTER — Other Ambulatory Visit: Payer: Self-pay | Admitting: Hematology and Oncology

## 2020-10-21 ENCOUNTER — Inpatient Hospital Stay: Payer: 59 | Attending: Genetic Counselor

## 2020-10-21 ENCOUNTER — Encounter: Payer: Self-pay | Admitting: Hematology and Oncology

## 2020-10-21 ENCOUNTER — Inpatient Hospital Stay (HOSPITAL_BASED_OUTPATIENT_CLINIC_OR_DEPARTMENT_OTHER): Payer: 59 | Admitting: Hematology and Oncology

## 2020-10-21 VITALS — BP 157/97

## 2020-10-21 DIAGNOSIS — C787 Secondary malignant neoplasm of liver and intrahepatic bile duct: Secondary | ICD-10-CM | POA: Diagnosis present

## 2020-10-21 DIAGNOSIS — G893 Neoplasm related pain (acute) (chronic): Secondary | ICD-10-CM | POA: Insufficient documentation

## 2020-10-21 DIAGNOSIS — F418 Other specified anxiety disorders: Secondary | ICD-10-CM | POA: Diagnosis not present

## 2020-10-21 DIAGNOSIS — C259 Malignant neoplasm of pancreas, unspecified: Secondary | ICD-10-CM

## 2020-10-21 DIAGNOSIS — Z95828 Presence of other vascular implants and grafts: Secondary | ICD-10-CM

## 2020-10-21 DIAGNOSIS — Z452 Encounter for adjustment and management of vascular access device: Secondary | ICD-10-CM | POA: Insufficient documentation

## 2020-10-21 DIAGNOSIS — G629 Polyneuropathy, unspecified: Secondary | ICD-10-CM | POA: Insufficient documentation

## 2020-10-21 DIAGNOSIS — Z5111 Encounter for antineoplastic chemotherapy: Secondary | ICD-10-CM | POA: Insufficient documentation

## 2020-10-21 LAB — CMP (CANCER CENTER ONLY)
ALT: 9 U/L (ref 0–44)
AST: 14 U/L — ABNORMAL LOW (ref 15–41)
Albumin: 3.7 g/dL (ref 3.5–5.0)
Alkaline Phosphatase: 87 U/L (ref 38–126)
Anion gap: 9 (ref 5–15)
BUN: 11 mg/dL (ref 8–23)
CO2: 24 mmol/L (ref 22–32)
Calcium: 9.2 mg/dL (ref 8.9–10.3)
Chloride: 106 mmol/L (ref 98–111)
Creatinine: 1.05 mg/dL (ref 0.61–1.24)
GFR, Estimated: >60 mL/min (ref >60)
Glucose, Bld: 172 mg/dL — ABNORMAL HIGH (ref 70–99)
Potassium: 4.1 mmol/L (ref 3.5–5.1)
Sodium: 139 mmol/L (ref 135–145)
Total Bilirubin: 0.8 mg/dL (ref 0.3–1.2)
Total Protein: 6.3 g/dL — ABNORMAL LOW (ref 6.5–8.1)

## 2020-10-21 LAB — CBC WITH DIFFERENTIAL (CANCER CENTER ONLY)
Abs Immature Granulocytes: 0 10*3/uL (ref 0.00–0.07)
Basophils Absolute: 0 10*3/uL (ref 0.0–0.1)
Basophils Relative: 1 %
Eosinophils Absolute: 0 10*3/uL (ref 0.0–0.5)
Eosinophils Relative: 1 %
HCT: 34.8 % — ABNORMAL LOW (ref 39.0–52.0)
Hemoglobin: 11.9 g/dL — ABNORMAL LOW (ref 13.0–17.0)
Immature Granulocytes: 0 %
Lymphocytes Relative: 34 %
Lymphs Abs: 1.1 10*3/uL (ref 0.7–4.0)
MCH: 32.8 pg (ref 26.0–34.0)
MCHC: 34.2 g/dL (ref 30.0–36.0)
MCV: 95.9 fL (ref 80.0–100.0)
Monocytes Absolute: 0.4 10*3/uL (ref 0.1–1.0)
Monocytes Relative: 12 %
Neutro Abs: 1.6 10*3/uL — ABNORMAL LOW (ref 1.7–7.7)
Neutrophils Relative %: 52 %
Platelet Count: 98 10*3/uL — ABNORMAL LOW (ref 150–400)
RBC: 3.63 MIL/uL — ABNORMAL LOW (ref 4.22–5.81)
RDW: 15.8 % — ABNORMAL HIGH (ref 11.5–15.5)
WBC Count: 3.1 10*3/uL — ABNORMAL LOW (ref 4.0–10.5)
nRBC: 0 % (ref 0.0–0.2)

## 2020-10-21 MED ORDER — HEPARIN SOD (PORK) LOCK FLUSH 100 UNIT/ML IV SOLN: 500.0000 [IU] | Freq: Once | INTRAVENOUS | Status: DC | PRN

## 2020-10-21 MED ORDER — PALONOSETRON HCL INJECTION 0.25 MG/5ML: 0.2500 mg | Freq: Once | INTRAVENOUS | Status: DC

## 2020-10-21 MED ORDER — SODIUM CHLORIDE 0.9 % IV SOLN: 150.0000 mg | Freq: Once | INTRAVENOUS | Status: DC

## 2020-10-21 MED ORDER — SODIUM CHLORIDE 0.9% FLUSH
10.0000 mL | Freq: Once | INTRAVENOUS | Status: AC
  Administered 2020-10-21: 10 mL

## 2020-10-21 MED ORDER — FLUOROURACIL CHEMO INJECTION 2.5 GM/50ML
400.0000 mg/m2 | Freq: Once | INTRAVENOUS | Status: AC
  Administered 2020-10-21: 800 mg via INTRAVENOUS
  Filled 2020-10-21: qty 16

## 2020-10-21 MED ORDER — DEXTROSE 5 % IV SOLN: Freq: Once | INTRAVENOUS | Status: AC

## 2020-10-21 MED ORDER — SODIUM CHLORIDE 0.9% FLUSH: 10.0000 mL | INTRAVENOUS | Status: DC | PRN

## 2020-10-21 MED ORDER — SODIUM CHLORIDE 0.9 % IV SOLN
2400.0000 mg/m2 | INTRAVENOUS | Status: DC
  Administered 2020-10-21: 4850 mg via INTRAVENOUS
  Filled 2020-10-21: qty 97

## 2020-10-21 MED ORDER — LEUCOVORIN CALCIUM INJECTION 350 MG
400.0000 mg/m2 | Freq: Once | INTRAVENOUS | Status: AC
  Administered 2020-10-21: 812 mg via INTRAVENOUS
  Filled 2020-10-21: qty 35

## 2020-10-21 MED ORDER — SODIUM CHLORIDE 0.9 % IV SOLN: 10.0000 mg | Freq: Once | INTRAVENOUS | Status: DC

## 2020-10-21 MED ORDER — LORAZEPAM 0.5 MG PO TABS
0.5000 mg | ORAL_TABLET | Freq: Two times a day (BID) | ORAL | 0 refills | Status: DC | PRN
Start: 2020-10-21 — End: 2020-11-03

## 2020-10-21 NOTE — Patient Instructions (Signed)
[facility] ONCOLOGY  Discharge Instructions: Thank you for choosing [facility] to provide your oncology and hematology care.   If you have a lab appointment with the [facility], please go directly to the [facility] and check in at the registration area.   Wear comfortable clothing and clothing appropriate for easy access to any Portacath or PICC line.   We strive to give you quality time with your provider. You may need to reschedule your appointment if you arrive late (15 or more minutes).  Arriving late affects you and other patients whose appointments are after yours.  Also, if you miss three or more appointments without notifying the office, you may be dismissed from the clinic at the provider's discretion.      For prescription refill requests, have your pharmacy contact our office and allow 72 hours for refills to be completed.    Today you received the following chemotherapy and/or immunotherapy agents leucovorin and 5FU      To help prevent nausea and vomiting after your treatment, we encourage you to take your nausea medication as directed.  BELOW ARE SYMPTOMS THAT SHOULD BE REPORTED IMMEDIATELY: *FEVER GREATER THAN 100.4 F (38 C) OR HIGHER *CHILLS OR SWEATING *NAUSEA AND VOMITING THAT IS NOT CONTROLLED WITH YOUR NAUSEA MEDICATION *UNUSUAL SHORTNESS OF BREATH *UNUSUAL BRUISING OR BLEEDING *URINARY PROBLEMS (pain or burning when urinating, or frequent urination) *BOWEL PROBLEMS (unusual diarrhea, constipation, pain near the anus) TENDERNESS IN MOUTH AND THROAT WITH OR WITHOUT PRESENCE OF ULCERS (sore throat, sores in mouth, or a toothache) UNUSUAL RASH, SWELLING OR PAIN  UNUSUAL VAGINAL DISCHARGE OR ITCHING   Items with * indicate a potential emergency and should be followed up as soon as possible or go to the Emergency Department if any problems should occur.  Please show the CHEMOTHERAPY ALERT CARD or IMMUNOTHERAPY ALERT CARD at  check-in to the Emergency Department and triage nurse.  Should you have questions after your visit or need to cancel or reschedule your appointment, please contact [facility]  Dept: [phone]  and follow the prompts.  Office hours are 8:00 a.m. to 4:30 p.m. Monday - Friday. Please note that voicemails left after 4:00 p.m. may not be returned until the following business day.  We are closed weekends and major holidays. You have access to a nurse at all times for urgent questions. Please call the main number to the clinic Dept: [phone] and follow the prompts.   For any non-urgent questions, you may also contact your provider using MyChart. We now offer e-Visits for anyone 42 and older to request care online for non-urgent symptoms. For details visit [email or web address].   Also download the MyChart app! Go to the app store, search "MyChart", open the app, select [facility], and log in with your MyChart username and password.  Due to Covid, a mask is required upon entering the hospital/clinic. If you do not have a mask, one will be given to you upon arrival. For doctor visits, patients may have 1 support person aged 6 or older with them. For treatment visits, patients cannot have anyone with them due to current Covid guidelines and our immunocompromised population.

## 2020-10-21 NOTE — Progress Notes (Signed)
Confirmed w/ Dr. Chryl Heck, no premeds needed today since Oxaliplatin removed from tx plan.  Kennith Center, Pharm.D., CPP 10/21/2020'@11'$ :53 AM

## 2020-10-21 NOTE — Progress Notes (Signed)
[facility] FOLLOW UP NOTE  Patient Care Team: [name], MD as PCP - General (Hematology and Oncology) Gatha Mayer, MD as Consulting Physician (Gastroenterology) [name], MD as Consulting Physician (Otolaryngology) Elayne Snare, MD as Consulting Physician (Endocrinology) Gaye Pollack, MD as Consulting Physician (Cardiothoracic Surgery) Lucas Mallow, MD as Consulting Physician (Urology) Debara Pickett Nadean Corwin, MD as Consulting Physician (Cardiology) [name], MD as Consulting Physician (Hematology and Oncology) Jonnie Finner, RN (Inactive) as Oncology Nurse Navigator  CHIEF COMPLAINTS/PURPOSE OF CONSULTATION:   Pancreatic cancer, follow up prior to C8 of FOLFOX.  ASSESSMENT & PLAN:   Adenocarcinoma of pancreas, stage 4 ([employer]) This is a very pleasant 65 year old male patient with newly diagnosed metastatic pancreatic adenocarcinoma, initially started on FOLFIRINOX but did not tolerate well and hence the regimen has been switched to FOLFOX. 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  In the past have recommended dropping oxaliplatin multiple times per patient wanted to continue it.  He is now agreeable to discontinuing oxaliplatin.  I have mentioned to him that he may become permanently disabled otherwise.  He also understands that the neuropathy might linger even despite discontinuation of oxaliplatin.  Okay to proceed with 5-fluorouracil as planned today.  Repeat imaging to be scheduled mid-September.  We will most likely continue 5-FU maintenance and consider gem nab-paclitaxel upon progression.  Cancer related pain Cancer related pain has been well controlled for a few weeks.  He has been taking as needed oxycodone at this time.  We will continue to monitor this.  He does not need a refill for this today.  Anxiety about health He  uses Ativan every 12 hours for situational anxiety secondary to his current diagnosis.Timothy Page  Refill sent today.  No orders of the defined types were placed in this encounter.    HISTORY OF PRESENTING ILLNESS:   Timothy Page 65 y.o. male is here because of new diagnosis of pancreatic cancer.  Oncology History  Adenocarcinoma of pancreas, stage 4 ([place])  06/20/2020 Initial Diagnosis   Adenocarcinoma of pancreas, stage 4 ([place])   07/01/2020 -  Chemotherapy    Patient is on Treatment Plan: PANCREAS - FOLFOX Q14 DAYS      07/01/2020 Genetic Testing   Negative genetic testing on a custom panel reviewing multicancer and pancreatitis genes.  The Custom-Gene Panel offered by [facility] includes sequencing and/or deletion duplication testing of the following 91 genes: AIP, ALK, APC*, ATM*, AXIN2, BAP1, BARD1, BLM, BMPR1A, BRCA1, BRCA2, BRIP1, CASR, CDC73, CDH1, CDK4, CDKN1B, CDKN1C, CDKN2A (p14ARF), CDKN2A (p16INK4a), CEBPA, CFTR*, CHEK2, CPA1, CTNNA1, CTRC, DICER1*, DIS3L2, EGFR, EPCAM*, FANCC, FH*, FLCN, GATA2, GPC3*, GREM1*, HOXB13, HRAS, KIT, MAX*, MEN1*, MET*, MITF*, MLH1*, MSH2*, MSH3*, MSH6*, MUTYH, NBN, NF1*, NF2, NTHL1, PALB2, PALLD, PDGFRA, PHOX2B*, PMS2*, POLD1*, POLE, POT1, PRKAR1A, PRSS1*, PTCH1, PTEN*, RAD50, RAD51C, RAD51D, RB1*, RECQL4*, RET, RUNX1, SDHA*, SDHAF2, SDHB, SDHC*, SDHD, SMAD4, SMARCA4, SMARCB1, SMARCE1, SPINK1, STK11, SUFU, TERC, TERT, TMEM127, TP53, TSC1*, TSC2, VHL, WRN*, and WT1.  The report date is Jul 01, 2020.    Imaging   Imaging after 4 cycles of therapy  IMPRESSION: 1. Pancreatic neck mass is stable. 2. No new or progressive metastatic disease. Tiny soft tissue nodule along the posteroinferior right liver capsule is stable. Solitary liver metastasis adjacent to the gallbladder is mildly decreased. Small bilateral pulmonary nodules are all stable. 3. New small nonocclusive  thrombosis in the main portal vein. Persistent near occlusion of the proximal main portal vein  with narrowing of the portal splenic venous confluence. 4. Aortic Atherosclerosis (ICD10-I70.0).    INTERIM HISTORY  [name] is here for a follow-up before cycle 8 Patient is here for follow-up by himself.  Since his last visit, he has been doing quite well.  No constipation related issues since he has been taking stool softeners daily. With regards to his neuropathy however, he feels like his tongue is very numb cannot taste any food and his legs are also numb.  He has noticed that he has some balance issues and he recently fell.  No neuropathy reported in his fingers.  Pain has been very well controlled, its mostly localized to the left upper quadrant, he has only taken 1 oxycodone for the past week. No change in breathing, urinary habits or new neurological complaints. He is now agreeable to omitting oxaliplatin. Rest of the pertinent 10 point ROS reviewed and negative.  MEDICAL HISTORY:  Past Medical History:  Diagnosis Date   Arthritis    Atypical chest pain 04/2017   ACS ruled out 05/11/17   CAP (community acquired pneumonia) 05/07/2017   Chronic constipation    at least partially d/t chronic opioids   Diabetes mellitus without complication ([facility])    Poor control starting fall 2021-->GAD ab neg.  Insulin and C peptide levels wnl but on lower end of normal->Dr Kuma/endo 2022.   Exocrine pancreatic insufficiency 2022   History of thyroidectomy, total 2018   Hurlthe cell adenoma   Hx of adenomatous polyp of colon 07/02/2009   rpt colonoscopy 2026   Hyperlipidemia    Intol of multiple statins and zetia. [facility] lipid clinic 2022.   Hypertension    Lipoma    back   Myocardial infarction [facility])    MI at age 64   Pancreatic adenocarcinoma ([place]) 06/2020   Mets to liver (?and lungs?->stage 4), w/acute pancreatitis.   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 Cammy Copa   Right ureteral calculus 04/2018   Cystoscopy with right retrograde pyelogram and right ureteral stent placement after stone removal.   Seasonal allergies    Stroke ([place])    in 15Q no complications    Tachyarrhythmia 1980   age 30 X 2 over 52 month period.  No recurrence since that time.   Thoracic aortic aneurysm ([facility])    Incidentally discovered, 4.1 cm-->Dr. Cyndia Bent. Rpt CT 08/2018 and 08/2019 stable.   Vocal cord paralysis 07/2016   Left vocal cord paralysis noted after thyroid surgery.    SURGICAL HISTORY: Past Surgical History:  Procedure Laterality Date   BIOPSY  06/18/2020   Procedure: BIOPSY;  Surgeon: Mansouraty, Telford Nab., MD;  Location: [facility] ENDOSCOPY;  Service: Gastroenterology;;   [place] W/ POLYPECTOMY  07/02/2009; 01/24/20   2011 adenoma.  2021 adenomas x 3->recall 2026   CYSTOSCOPY WITH RETROGRADE PYELOGRAM, URETEROSCOPY AND STENT PLACEMENT Right 05/09/2018   Procedure: CYSTOSCOPY WITH RIGHT RETROGRADE RIGHT URETEROSCOPY STONE EXTRACTION RIGHT STENT EXCHANGE;  Surgeon: Lucas Mallow, MD;  Location: [facility] ORS;  Service: Urology;  Laterality: Right;   CYSTOSCOPY/RETROGRADE/URETEROSCOPY/STONE EXTRACTION WITH BASKET Right 04/30/2018   Procedure: CYSTOSCOPY/RETROGRADE/RIGHT URETEROSCOPY/;  Surgeon: Marton Redwood III,  MD;  Location: [facility] ORS;  Service: Urology;  Laterality: Right;   ESOPHAGOGASTRODUODENOSCOPY  07/02/2009   NORMAL   ESOPHAGOGASTRODUODENOSCOPY (EGD) WITH PROPOFOL N/A 06/18/2020   Procedure: ESOPHAGOGASTRODUODENOSCOPY (EGD) WITH PROPOFOL;  Surgeon: Rush Landmark Telford Nab., MD;  Location: [facility];  Service: Gastroenterology;  Laterality: N/A;   EYE SURGERY     FINE NEEDLE ASPIRATION  06/18/2020   Procedure: FINE NEEDLE ASPIRATION (FNA) LINEAR;  Surgeon: Irving Copas., MD;  Location: [facility];  Service:  Gastroenterology;;   finger amputaion     with reattachement   INCISION AND DRAINAGE ABSCESS ANAL  11/23/2019   gen surg (In ED).   IR IMAGING GUIDED PORT INSERTION  06/22/2020   LASIK     THYROIDECTOMY N/A 08/03/2016   Procedure: Total THYROIDECTOMY;  Surgeon: [name], MD;  Location: [facility];  Service: ENT;  Laterality: N/A;  Total Thyroidectomy    UPPER ESOPHAGEAL ENDOSCOPIC ULTRASOUND (EUS) N/A 06/18/2020   Procedure: UPPER ESOPHAGEAL ENDOSCOPIC ULTRASOUND (EUS);  Surgeon: Irving Copas., MD;  Location: [facility];  Service: Gastroenterology;  Laterality: N/A;   UPPER GASTROINTESTINAL ENDOSCOPY     with EUS and bx of pancreatic mass. +appearance of candida in esoph.      SOCIAL HISTORY: Social History   Socioeconomic History   Marital status: Married    Spouse name: Not on file   Number of children: Not on file   Years of education: Not on file   Highest education level: Not on file  Occupational History   Not on file  Tobacco Use   Smoking status: Never   Smokeless tobacco: Current    Types: Snuff  Vaping Use   Vaping Use: Never used  Substance and Sexual Activity   Alcohol use: No   Drug use: No   Sexual activity: Not on file  Other Topics Concern   Not on file  Social History Narrative   Married, 2 daughters, 1 grandson that lives with him.   Educ: some college   Occup: retired from the [employer].  Retired at 69 but returned to work at some point, now with grandson living with him.   No T/A/Ds.   Social Determinants of Health   Financial Resource Strain: Not on file  Food Insecurity: Not on file  Transportation Needs: Not on file  Physical Activity: Not on file  Stress: Not on file  Social Connections: Not on file  Intimate Partner Violence: Not on file    FAMILY HISTORY: Family History  Problem Relation Age of Onset   Diabetes Mother    Cancer Mother        lung   COPD Mother    Brain cancer Mother    Arthritis Mother     Hyperlipidemia Mother    Heart disease Mother    Hypertension Mother    Diabetes Father    Cancer Father        mesothelioma; skin   Mental illness Father    Arthritis Father    Hyperlipidemia Father    Heart disease Father    Hypertension Father    Heart disease Maternal Grandmother        MI @ 52   Heart disease Maternal Grandfather        MI @ 66   Diabetes Paternal Grandmother    Dementia Paternal Grandmother    Heart disease Paternal Grandfather        MI @ 23   Colon cancer Neg Hx    Esophageal  cancer Neg Hx    Rectal cancer Neg Hx    Stomach cancer Neg Hx     ALLERGIES:  is allergic to atenolol, hydroxyzine, tetracycline, atorvastatin, codeine, guaifenesin, and mucinex [guaifenesin er].  MEDICATIONS:  Current Outpatient Medications  Medication Sig Dispense Refill   apixaban (ELIQUIS) 5 MG TABS tablet Take 1 tablet (5 mg total) by mouth 2 (two) times daily. 60 tablet 1   Continuous Blood Gluc Sensor (FREESTYLE LIBRE 2 SENSOR) MISC APPLY 1 SENSOR TO THE SKIN EVERY 14 DAYS AS DIRECTED 2 each 3   dexamethasone (DECADRON) 4 MG tablet Take 2 tablets by mouth 2 (two) times daily.     docusate sodium (COLACE) 100 MG capsule Take 100 mg by mouth 2 (two) times daily.     HUMALOG KWIKPEN 100 UNIT/ML KwikPen Inject 20-22 Units into the skin daily.     insulin glargine (LANTUS SOLOSTAR) 100 UNIT/ML Solostar Pen Give up to 10 units under the skin every evening at bedtime. 15 mL 1   Insulin Pen Needle 31G X 5 MM MISC Use with insulin pen 3 times a day 100 each 1   lactulose (CHRONULAC) 10 GM/15ML solution Take 15 mLs (10 g total) by mouth 3 (three) times daily as needed for mild constipation. 236 mL 0   levothyroxine (SYNTHROID) 137 MCG tablet TAKE 1 TABLET BY MOUTH  DAILY BEFORE BREAKFAST 90 tablet 3   lipase/protease/amylase (CREON) 36000 UNITS CPEP capsule Take 2 capsules (72,000 Units total) by mouth 3 (three) times daily with meals. May also take 1 capsule (36,000 Units total) as  needed (with snacks). 240 capsule 11   LORazepam (ATIVAN) 0.5 MG tablet Take 1 tablet (0.5 mg total) by mouth every 12 (twelve) hours as needed for anxiety. 30 tablet 0   magic mouthwash SOLN SWISH WITH 30 MLS BY MOUTH FOUR TIMES DAILY AND SWALLOW. NO FOOD OR DRINK FOR 1 HOUR AFTERWARDS     magnesium hydroxide (MILK OF MAGNESIA) 400 MG/5ML suspension Take by mouth daily as needed for mild constipation.     morphine (MS CONTIN) 15 MG 12 hr tablet Take 1 tablet (15 mg total) by mouth every 12 (twelve) hours. For cancer pain 60 tablet 0   omeprazole (PRILOSEC) 20 MG capsule TAKE 1 CAPSULE(20 MG) BY MOUTH DAILY 30 capsule 0   ondansetron (ZOFRAN) 8 MG tablet Take 1 tablet (8 mg total) by mouth 2 (two) times daily as needed. Start on day 3 after chemotherapy. 30 tablet 1   oxyCODONE (OXY IR/ROXICODONE) 5 MG immediate release tablet Take 1 tablet (5 mg total) by mouth every 6 (six) hours as needed for severe pain or moderate pain. 60 tablet 0   polyethylene glycol (MIRALAX / GLYCOLAX) 17 g packet Take 17 g by mouth daily.     prochlorperazine (COMPAZINE) 10 MG tablet TAKE 1 TABLET(10 MG) BY MOUTH EVERY 6 HOURS AS NEEDED FOR NAUSEA OR VOMITING 30 tablet 1   ramipril (ALTACE) 10 MG capsule Take 10 mg by mouth 2 (two) times daily.     senna-docusate (SENOKOT-S) 8.6-50 MG tablet Take 1 tablet by mouth 2 (two) times daily.     No current facility-administered medications for this visit.   Facility-Administered Medications Ordered in Other Visits  Medication Dose Route Frequency Provider Last Rate Last Admin   fluorouracil (ADRUCIL) 4,850 mg in sodium chloride 0.9 % 53 mL chemo infusion  2,400 mg/m2 (Treatment Plan Recorded) Intravenous 1 day or 1 dose [name], MD  fluorouracil (ADRUCIL) chemo injection 800 mg  400 mg/m2 (Treatment Plan Recorded) Intravenous Once Gilmore List, MD       heparin lock flush 100 unit/mL  500 Units Intracatheter Once PRN Leoncio Hansen, Arletha Pili, MD       LORazepam  (ATIVAN) tablet 0.5 mg  0.5 mg Oral Once Truitt Merle, MD       sodium chloride flush (NS) 0.9 % injection 10 mL  10 mL Intracatheter PRN Duncan Alejandro, MD        PHYSICAL EXAMINATION: ECOG PERFORMANCE STATUS: 1 - Symptomatic but completely ambulatory  Physical Exam Constitutional:      General: He is not in acute distress.    Appearance: Normal appearance.  HENT:     Head: Normocephalic and atraumatic.  Cardiovascular:     Rate and Rhythm: Normal rate and regular rhythm.     Pulses: Normal pulses.     Heart sounds: Normal heart sounds.  Pulmonary:     Effort: Pulmonary effort is normal.     Breath sounds: Normal breath sounds.  Abdominal:     General: Abdomen is flat. Bowel sounds are normal.     Palpations: Abdomen is soft.     Tenderness: There is abdominal tenderness (Left upper quadrant tenderness, no guarding or rigidity or rebound tenderness.). There is no right CVA tenderness or guarding.  Musculoskeletal:        General: No swelling or tenderness. Normal range of motion.     Cervical back: Normal range of motion and neck supple.  Lymphadenopathy:     Cervical: No cervical adenopathy.  Skin:    General: Skin is warm and dry.  Neurological:     General: No focal deficit present.     Mental Status: He is alert.  Psychiatric:        Mood and Affect: Mood normal.        Behavior: Behavior normal.    LABORATORY DATA:  I have reviewed the data as listed Lab Results  Component Value Date   WBC 3.1 (L) 10/21/2020   HGB 11.9 (L) 10/21/2020   HCT 34.8 (L) 10/21/2020   MCV 95.9 10/21/2020   PLT 98 (L) 10/21/2020     Chemistry      Component Value Date/Time   NA 139 10/21/2020 1030   K 4.1 10/21/2020 1030   CL 106 10/21/2020 1030   CO2 24 10/21/2020 1030   BUN 11 10/21/2020 1030   CREATININE 1.05 10/21/2020 1030   CREATININE 1.11 11/03/2017 1533      Component Value Date/Time   CALCIUM 9.2 10/21/2020 1030   ALKPHOS 87 10/21/2020 1030   AST 14 (L) 10/21/2020  1030   ALT 9 10/21/2020 1030   BILITOT 0.8 10/21/2020 1030      RADIOGRAPHIC STUDIES:  No results found.  All questions were answered. The patient knows to call the clinic with any problems, questions or concerns.  I spent 30 minutes in the care of this patient including history and physical, review of records, counseling and coordination of care.   [name], MD 10/21/2020 1:11 PM

## 2020-10-21 NOTE — Progress Notes (Signed)
Per Dr Chryl Heck, ok to treat with platelets 98.

## 2020-10-21 NOTE — Assessment & Plan Note (Signed)
Cancer related pain has been well controlled for a few weeks.  He has been taking as needed oxycodone at this time.  We will continue to monitor this.  He does not need a refill for this today.

## 2020-10-21 NOTE — Progress Notes (Signed)
[place] FOLLOW UP NOTE  Patient Care Team: [name], MD as PCP - General (Hematology and Oncology) Gatha Mayer, MD as Consulting Physician (Gastroenterology) [name], MD as Consulting Physician (Otolaryngology) Elayne Snare, MD as Consulting Physician (Endocrinology) Gaye Pollack, MD as Consulting Physician (Cardiothoracic Surgery) Lucas Mallow, MD as Consulting Physician (Urology) Debara Pickett Nadean Corwin, MD as Consulting Physician (Cardiology) [name], MD as Consulting Physician (Hematology and Oncology) Jonnie Finner, RN (Inactive) as Oncology Nurse Navigator  CHIEF COMPLAINTS/PURPOSE OF CONSULTATION:   Pancreatic cancer, follow up prior to C8 of FOLFOX.  ASSESSMENT & PLAN:   Adenocarcinoma of pancreas, stage 4 ([place]) This is a very pleasant 65 year old male patient with newly diagnosed metastatic pancreatic adenocarcinoma, initially started on FOLFIRINOX but did not tolerate well and hence the regimen has been switched to FOLFOX. 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  In the past have recommended dropping oxaliplatin multiple times per patient wanted to continue it.  He is now agreeable to discontinuing oxaliplatin.  I have mentioned to him that he may become permanently disabled otherwise.  He also understands that the neuropathy might linger even despite discontinuation of oxaliplatin.  Okay to proceed with 5-fluorouracil as planned today.  Repeat imaging to be scheduled mid-September.  We will most likely continue 5-FU maintenance and consider gem nab-paclitaxel upon progression.  Cancer related pain Cancer related pain has been well controlled for a few weeks.  He has been taking as needed oxycodone at this time.  We will continue to monitor this.  He does not need a refill for this today.  Anxiety about health He  uses Ativan every 12 hours for situational anxiety secondary to his current diagnosis.[name]  Refill sent today.  No orders of the defined types were placed in this encounter.    HISTORY OF PRESENTING ILLNESS:   Timothy Page 65 y.o. male is here because of new diagnosis of pancreatic cancer.  Oncology History  Adenocarcinoma of pancreas, stage 4 ([place])  06/20/2020 Initial Diagnosis   Adenocarcinoma of pancreas, stage 4 ([place])   07/01/2020 -  Chemotherapy    Patient is on Treatment Plan: PANCREAS - FOLFOX Q14 DAYS       07/01/2020 Genetic Testing   Negative genetic testing on a custom panel reviewing multicancer and pancreatitis genes.  The Custom-Gene Panel offered by [facility] includes sequencing and/or deletion duplication testing of the following 91 genes: AIP, ALK, APC*, ATM*, AXIN2, BAP1, BARD1, BLM, BMPR1A, BRCA1, BRCA2, BRIP1, CASR, CDC73, CDH1, CDK4, CDKN1B, CDKN1C, CDKN2A (p14ARF), CDKN2A (p16INK4a), CEBPA, CFTR*, CHEK2, CPA1, CTNNA1, CTRC, DICER1*, DIS3L2, EGFR, EPCAM*, FANCC, FH*, FLCN, GATA2, GPC3*, GREM1*, HOXB13, HRAS, KIT, MAX*, MEN1*, MET*, MITF*, MLH1*, MSH2*, MSH3*, MSH6*, MUTYH, NBN, NF1*, NF2, NTHL1, PALB2, PALLD, PDGFRA, PHOX2B*, PMS2*, POLD1*, POLE, POT1, PRKAR1A, PRSS1*, PTCH1, PTEN*, RAD50, RAD51C, RAD51D, RB1*, RECQL4*, RET, RUNX1, SDHA*, SDHAF2, SDHB, SDHC*, SDHD, SMAD4, SMARCA4, SMARCB1, SMARCE1, SPINK1, STK11, SUFU, TERC, TERT, TMEM127, TP53, TSC1*, TSC2, VHL, WRN*, and WT1.  The report date is Jul 01, 2020.    Imaging   Imaging after 4 cycles of therapy  IMPRESSION: 1. Pancreatic neck mass is stable. 2. No new or progressive metastatic disease. Tiny soft tissue nodule along the posteroinferior right liver capsule is stable. Solitary liver metastasis adjacent to the gallbladder is mildly decreased. Small bilateral pulmonary nodules are all stable. 3. New small  nonocclusive thrombosis in the main portal vein. Persistent near occlusion of the proximal main portal vein  with narrowing of the portal splenic venous confluence. 4. Aortic Atherosclerosis (ICD10-I70.0).    INTERIM HISTORY  Timothy Page is here for a follow-up before cycle 8 Patient is here for follow-up by himself.  Since his last visit, he has been doing quite well.  No constipation related issues since he has been taking stool softeners daily. With regards to his neuropathy however, he feels like his tongue is very numb cannot taste any food and his legs are also numb.  He has noticed that he has some balance issues and he recently fell.  No neuropathy reported in his fingers.  Pain has been very well controlled, its mostly localized to the left upper quadrant, he has only taken 1 oxycodone for the past week. No change in breathing, urinary habits or new neurological complaints. He is now agreeable to omitting oxaliplatin. Rest of the pertinent 10 point ROS reviewed and negative.  MEDICAL HISTORY:  Past Medical History:  Diagnosis Date   Arthritis    Atypical chest pain 04/2017   ACS ruled out 05/11/17   CAP (community acquired pneumonia) 05/07/2017   Chronic constipation    at least partially d/t chronic opioids   Diabetes mellitus without complication ([place])    Poor control starting fall 2021-->GAD ab neg.  Insulin and C peptide levels wnl but on lower end of normal->Dr Kuma/endo 2022.   Exocrine pancreatic insufficiency 2022   History of thyroidectomy, total 2018   Hurlthe cell adenoma   Hx of adenomatous polyp of colon 07/02/2009   rpt colonoscopy 2026   Hyperlipidemia    Intol of multiple statins and zetia. [facility] lipid clinic 2022.   Hypertension    Lipoma    back   Myocardial infarction [facility])    MI at age 64   Pancreatic adenocarcinoma ([place]) 06/2020   Mets to liver (?and lungs?->stage 4), w/acute pancreatitis.   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 Cammy Copa   Right ureteral calculus 04/2018   Cystoscopy with right retrograde pyelogram and right ureteral stent placement after stone removal.   Seasonal allergies    Stroke ([place])    in 84X no complications    Tachyarrhythmia 1980   age 75 X 2 over 14 month period.  No recurrence since that time.   Thoracic aortic aneurysm ([facility])    Incidentally discovered, 4.1 cm-->Dr. Cyndia Bent. Rpt CT 08/2018 and 08/2019 stable.   Vocal cord paralysis 07/2016   Left vocal cord paralysis noted after thyroid surgery.    SURGICAL HISTORY: Past Surgical History:  Procedure Laterality Date   BIOPSY  06/18/2020   Procedure: BIOPSY;  Surgeon: Mansouraty, Telford Nab., MD;  Location: [facility] ENDOSCOPY;  Service: Gastroenterology;;   [place] W/ POLYPECTOMY  07/02/2009; 01/24/20   2011 adenoma.  2021 adenomas x 3->recall 2026   CYSTOSCOPY WITH RETROGRADE PYELOGRAM, URETEROSCOPY AND STENT PLACEMENT Right 05/09/2018   Procedure: CYSTOSCOPY WITH RIGHT RETROGRADE RIGHT URETEROSCOPY STONE EXTRACTION RIGHT STENT EXCHANGE;  Surgeon: Lucas Mallow, MD;  Location: [facility] ORS;  Service: Urology;  Laterality: Right;   CYSTOSCOPY/RETROGRADE/URETEROSCOPY/STONE EXTRACTION WITH BASKET Right 04/30/2018   Procedure: CYSTOSCOPY/RETROGRADE/RIGHT URETEROSCOPY/;  Surgeon: Marton Redwood  III, MD;  Location: [facility] ORS;  Service: Urology;  Laterality: Right;   ESOPHAGOGASTRODUODENOSCOPY  07/02/2009   NORMAL   ESOPHAGOGASTRODUODENOSCOPY (EGD) WITH PROPOFOL N/A 06/18/2020   Procedure: ESOPHAGOGASTRODUODENOSCOPY (EGD) WITH PROPOFOL;  Surgeon: Rush Landmark Telford Nab., MD;  Location: [place];  Service: Gastroenterology;  Laterality: N/A;   EYE SURGERY     FINE NEEDLE ASPIRATION  06/18/2020   Procedure: FINE NEEDLE ASPIRATION (FNA) LINEAR;  Surgeon: Irving Copas., MD;  Location: [place];  Service:  Gastroenterology;;   finger amputaion     with reattachement   INCISION AND DRAINAGE ABSCESS ANAL  11/23/2019   gen surg (In ED).   IR IMAGING GUIDED PORT INSERTION  06/22/2020   LASIK     THYROIDECTOMY N/A 08/03/2016   Procedure: Total THYROIDECTOMY;  Surgeon: [name], MD;  Location: [place];  Service: ENT;  Laterality: N/A;  Total Thyroidectomy    UPPER ESOPHAGEAL ENDOSCOPIC ULTRASOUND (EUS) N/A 06/18/2020   Procedure: UPPER ESOPHAGEAL ENDOSCOPIC ULTRASOUND (EUS);  Surgeon: Irving Copas., MD;  Location: [place];  Service: Gastroenterology;  Laterality: N/A;   UPPER GASTROINTESTINAL ENDOSCOPY     with EUS and bx of pancreatic mass. +appearance of candida in esoph.      SOCIAL HISTORY: Social History   Socioeconomic History   Marital status: Married    Spouse name: Not on file   Number of children: Not on file   Years of education: Not on file   Highest education level: Not on file  Occupational History   Not on file  Tobacco Use   Smoking status: Never   Smokeless tobacco: Current    Types: Snuff  Vaping Use   Vaping Use: Never used  Substance and Sexual Activity   Alcohol use: No   Drug use: No   Sexual activity: Not on file  Other Topics Concern   Not on file  Social History Narrative   Married, 2 daughters, 1 grandson that lives with him.   Educ: some college   Occup: retired from the [employer].  Retired at 89 but returned to work at some point, now with grandson living with him.   No T/A/Ds.   Social Determinants of Health   Financial Resource Strain: Not on file  Food Insecurity: Not on file  Transportation Needs: Not on file  Physical Activity: Not on file  Stress: Not on file  Social Connections: Not on file  Intimate Partner Violence: Not on file    FAMILY HISTORY: Family History  Problem Relation Age of Onset   Diabetes Mother    Cancer Mother        lung   COPD Mother    Brain cancer Mother    Arthritis Mother     Hyperlipidemia Mother    Heart disease Mother    Hypertension Mother    Diabetes Father    Cancer Father        mesothelioma; skin   Mental illness Father    Arthritis Father    Hyperlipidemia Father    Heart disease Father    Hypertension Father    Heart disease Maternal Grandmother        MI @ 23   Heart disease Maternal Grandfather        MI @ 79   Diabetes Paternal Grandmother    Dementia Paternal Grandmother    Heart disease Paternal Grandfather        MI @ 47   Colon cancer Neg Hx  Esophageal cancer Neg Hx    Rectal cancer Neg Hx    Stomach cancer Neg Hx     ALLERGIES:  is allergic to atenolol, hydroxyzine, tetracycline, atorvastatin, codeine, guaifenesin, and mucinex [guaifenesin er].  MEDICATIONS:  Current Outpatient Medications  Medication Sig Dispense Refill   apixaban (ELIQUIS) 5 MG TABS tablet Take 1 tablet (5 mg total) by mouth 2 (two) times daily. 60 tablet 1   Continuous Blood Gluc Sensor (FREESTYLE LIBRE 2 SENSOR) MISC APPLY 1 SENSOR TO THE SKIN EVERY 14 DAYS AS DIRECTED 2 each 3   dexamethasone (DECADRON) 4 MG tablet Take 2 tablets by mouth 2 (two) times daily.     docusate sodium (COLACE) 100 MG capsule Take 100 mg by mouth 2 (two) times daily.     HUMALOG KWIKPEN 100 UNIT/ML KwikPen Inject 20-22 Units into the skin daily.     insulin glargine (LANTUS SOLOSTAR) 100 UNIT/ML Solostar Pen Give up to 10 units under the skin every evening at bedtime. 15 mL 1   Insulin Pen Needle 31G X 5 MM MISC Use with insulin pen 3 times a day 100 each 1   lactulose (CHRONULAC) 10 GM/15ML solution Take 15 mLs (10 g total) by mouth 3 (three) times daily as needed for mild constipation. 236 mL 0   levothyroxine (SYNTHROID) 137 MCG tablet TAKE 1 TABLET BY MOUTH  DAILY BEFORE BREAKFAST 90 tablet 3   lipase/protease/amylase (CREON) 36000 UNITS CPEP capsule Take 2 capsules (72,000 Units total) by mouth 3 (three) times daily with meals. May also take 1 capsule (36,000 Units total) as  needed (with snacks). 240 capsule 11   LORazepam (ATIVAN) 0.5 MG tablet Take 1 tablet (0.5 mg total) by mouth every 12 (twelve) hours as needed for anxiety. 30 tablet 0   magic mouthwash SOLN SWISH WITH 30 MLS BY MOUTH FOUR TIMES DAILY AND SWALLOW. NO FOOD OR DRINK FOR 1 HOUR AFTERWARDS     magnesium hydroxide (MILK OF MAGNESIA) 400 MG/5ML suspension Take by mouth daily as needed for mild constipation.     morphine (MS CONTIN) 15 MG 12 hr tablet Take 1 tablet (15 mg total) by mouth every 12 (twelve) hours. For cancer pain 60 tablet 0   omeprazole (PRILOSEC) 20 MG capsule TAKE 1 CAPSULE(20 MG) BY MOUTH DAILY 30 capsule 0   ondansetron (ZOFRAN) 8 MG tablet Take 1 tablet (8 mg total) by mouth 2 (two) times daily as needed. Start on day 3 after chemotherapy. 30 tablet 1   oxyCODONE (OXY IR/ROXICODONE) 5 MG immediate release tablet Take 1 tablet (5 mg total) by mouth every 6 (six) hours as needed for severe pain or moderate pain. 60 tablet 0   polyethylene glycol (MIRALAX / GLYCOLAX) 17 g packet Take 17 g by mouth daily.     prochlorperazine (COMPAZINE) 10 MG tablet TAKE 1 TABLET(10 MG) BY MOUTH EVERY 6 HOURS AS NEEDED FOR NAUSEA OR VOMITING 30 tablet 1   ramipril (ALTACE) 10 MG capsule Take 10 mg by mouth 2 (two) times daily.     senna-docusate (SENOKOT-S) 8.6-50 MG tablet Take 1 tablet by mouth 2 (two) times daily.     No current facility-administered medications for this visit.   Facility-Administered Medications Ordered in Other Visits  Medication Dose Route Frequency Provider Last Rate Last Admin   fluorouracil (ADRUCIL) 4,850 mg in sodium chloride 0.9 % 53 mL chemo infusion  2,400 mg/m2 (Treatment Plan Recorded) Intravenous 1 day or 1 dose [name], MD  fluorouracil (ADRUCIL) chemo injection 800 mg  400 mg/m2 (Treatment Plan Recorded) Intravenous Once , , MD       heparin lock flush 100 unit/mL  500 Units Intracatheter Once PRN , Arletha Pili, MD       LORazepam  (ATIVAN) tablet 0.5 mg  0.5 mg Oral Once Truitt Merle, MD       sodium chloride flush (NS) 0.9 % injection 10 mL  10 mL Intracatheter PRN , , MD        PHYSICAL EXAMINATION: ECOG PERFORMANCE STATUS: 1 - Symptomatic but completely ambulatory  Physical Exam Constitutional:      General: He is not in acute distress.    Appearance: Normal appearance.  HENT:     Head: Normocephalic and atraumatic.  Cardiovascular:     Rate and Rhythm: Normal rate and regular rhythm.     Pulses: Normal pulses.     Heart sounds: Normal heart sounds.  Pulmonary:     Effort: Pulmonary effort is normal.     Breath sounds: Normal breath sounds.  Abdominal:     General: Abdomen is flat. Bowel sounds are normal.     Palpations: Abdomen is soft.     Tenderness: There is abdominal tenderness (Left upper quadrant tenderness, no guarding or rigidity or rebound tenderness.). There is no right CVA tenderness or guarding.  Musculoskeletal:        General: No swelling or tenderness. Normal range of motion.     Cervical back: Normal range of motion and neck supple.  Lymphadenopathy:     Cervical: No cervical adenopathy.  Skin:    General: Skin is warm and dry.  Neurological:     General: No focal deficit present.     Mental Status: He is alert.  Psychiatric:        Mood and Affect: Mood normal.        Behavior: Behavior normal.    LABORATORY DATA:  I have reviewed the data as listed Lab Results  Component Value Date   WBC 3.1 (L) 10/21/2020   HGB 11.9 (L) 10/21/2020   HCT 34.8 (L) 10/21/2020   MCV 95.9 10/21/2020   PLT 98 (L) 10/21/2020     Chemistry      Component Value Date/Time   NA 139 10/21/2020 1030   K 4.1 10/21/2020 1030   CL 106 10/21/2020 1030   CO2 24 10/21/2020 1030   BUN 11 10/21/2020 1030   CREATININE 1.05 10/21/2020 1030   CREATININE 1.11 11/03/2017 1533      Component Value Date/Time   CALCIUM 9.2 10/21/2020 1030   ALKPHOS 87 10/21/2020 1030   AST 14 (L) 10/21/2020  1030   ALT 9 10/21/2020 1030   BILITOT 0.8 10/21/2020 1030      RADIOGRAPHIC STUDIES:  No results found.  All questions were answered. The patient knows to call the clinic with any problems, questions or concerns.  I spent 30 minutes in the care of this patient including history and physical, review of records, counseling and coordination of care.   [name], MD 10/21/2020 1:09 PM

## 2020-10-21 NOTE — Assessment & Plan Note (Signed)
He uses Ativan every 12 hours for situational anxiety secondary to his current diagnosis.[name]  Refill sent today.

## 2020-10-21 NOTE — Assessment & Plan Note (Signed)
This is a very pleasant 65 year old male patient with newly diagnosed metastatic pancreatic adenocarcinoma, initially started on FOLFIRINOX but did not tolerate well and hence the regimen has been switched to FOLFOX. 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  In the past have recommended dropping oxaliplatin multiple times per patient wanted to continue it.  He is now agreeable to discontinuing oxaliplatin.  I have mentioned to him that he may become permanently disabled otherwise.  He also understands that the neuropathy might linger even despite discontinuation of oxaliplatin.  Okay to proceed with 5-fluorouracil as planned today.  Repeat imaging to be scheduled mid-September.  We will most likely continue 5-FU maintenance and consider gem nab-paclitaxel upon progression.

## 2020-10-23 ENCOUNTER — Other Ambulatory Visit: Payer: Self-pay

## 2020-10-23 ENCOUNTER — Inpatient Hospital Stay: Payer: 59

## 2020-10-23 VITALS — BP 170/99 | HR 77 | Resp 18

## 2020-10-23 DIAGNOSIS — Z5111 Encounter for antineoplastic chemotherapy: Secondary | ICD-10-CM | POA: Diagnosis not present

## 2020-10-23 DIAGNOSIS — Z95828 Presence of other vascular implants and grafts: Secondary | ICD-10-CM

## 2020-10-23 MED ORDER — HEPARIN SOD (PORK) LOCK FLUSH 100 UNIT/ML IV SOLN
500.0000 [IU] | Freq: Once | INTRAVENOUS | Status: AC
Start: 2020-10-23 — End: 2020-10-23
  Administered 2020-10-23: 500 [IU]

## 2020-10-23 MED ORDER — SODIUM CHLORIDE 0.9% FLUSH
10.0000 mL | Freq: Once | INTRAVENOUS | Status: AC
  Administered 2020-10-23: 10 mL

## 2020-10-30 ENCOUNTER — Other Ambulatory Visit: Payer: Self-pay | Admitting: Hematology and Oncology

## 2020-11-02 ENCOUNTER — Other Ambulatory Visit: Payer: Self-pay

## 2020-11-02 ENCOUNTER — Ambulatory Visit (HOSPITAL_COMMUNITY)
Admission: RE | Admit: 2020-11-02 | Discharge: 2020-11-02 | Disposition: A | Discharge status: Home / Self Care | Payer: 59 | Source: Ambulatory Visit | Attending: Hematology and Oncology | Admitting: Hematology and Oncology

## 2020-11-02 ENCOUNTER — Encounter (HOSPITAL_COMMUNITY): Payer: Self-pay

## 2020-11-02 DIAGNOSIS — C259 Malignant neoplasm of pancreas, unspecified: Secondary | ICD-10-CM | POA: Insufficient documentation

## 2020-11-02 MED ORDER — IOHEXOL 350 MG/ML SOLN
75.0000 mL | Freq: Once | INTRAVENOUS | Status: AC | PRN
  Administered 2020-11-02: 75 mL via INTRAVENOUS

## 2020-11-03 ENCOUNTER — Other Ambulatory Visit: Payer: 59

## 2020-11-03 ENCOUNTER — Inpatient Hospital Stay: Payer: 59

## 2020-11-03 ENCOUNTER — Encounter: Payer: Self-pay | Admitting: Hematology and Oncology

## 2020-11-03 ENCOUNTER — Other Ambulatory Visit: Payer: Self-pay

## 2020-11-03 ENCOUNTER — Inpatient Hospital Stay (HOSPITAL_BASED_OUTPATIENT_CLINIC_OR_DEPARTMENT_OTHER): Payer: 59 | Admitting: Hematology and Oncology

## 2020-11-03 DIAGNOSIS — C259 Malignant neoplasm of pancreas, unspecified: Secondary | ICD-10-CM | POA: Diagnosis not present

## 2020-11-03 DIAGNOSIS — Z5111 Encounter for antineoplastic chemotherapy: Secondary | ICD-10-CM | POA: Diagnosis not present

## 2020-11-03 DIAGNOSIS — R634 Abnormal weight loss: Secondary | ICD-10-CM

## 2020-11-03 DIAGNOSIS — Z95828 Presence of other vascular implants and grafts: Secondary | ICD-10-CM

## 2020-11-03 DIAGNOSIS — G893 Neoplasm related pain (acute) (chronic): Secondary | ICD-10-CM

## 2020-11-03 LAB — CMP (CANCER CENTER ONLY)
ALT: 10 U/L (ref 0–44)
AST: 12 U/L — ABNORMAL LOW (ref 15–41)
Albumin: 3.9 g/dL (ref 3.5–5.0)
Alkaline Phosphatase: 106 U/L (ref 38–126)
Anion gap: 11 (ref 5–15)
BUN: 9 mg/dL (ref 8–23)
CO2: 24 mmol/L (ref 22–32)
Calcium: 9.5 mg/dL (ref 8.9–10.3)
Chloride: 106 mmol/L (ref 98–111)
Creatinine: 1.24 mg/dL (ref 0.61–1.24)
GFR, Estimated: >60 mL/min (ref >60)
Glucose, Bld: 205 mg/dL — ABNORMAL HIGH (ref 70–99)
Potassium: 4 mmol/L (ref 3.5–5.1)
Sodium: 141 mmol/L (ref 135–145)
Total Bilirubin: 0.7 mg/dL (ref 0.3–1.2)
Total Protein: 6.5 g/dL (ref 6.5–8.1)

## 2020-11-03 LAB — CBC WITH DIFFERENTIAL/PLATELET
Abs Immature Granulocytes: 0.02 10*3/uL (ref 0.00–0.07)
Basophils Absolute: 0 10*3/uL (ref 0.0–0.1)
Basophils Relative: 1 %
Eosinophils Absolute: 0.1 10*3/uL (ref 0.0–0.5)
Eosinophils Relative: 1 %
HCT: 34.2 % — ABNORMAL LOW (ref 39.0–52.0)
Hemoglobin: 12 g/dL — ABNORMAL LOW (ref 13.0–17.0)
Immature Granulocytes: 1 %
Lymphocytes Relative: 36 %
Lymphs Abs: 1.4 10*3/uL (ref 0.7–4.0)
MCH: 33.3 pg (ref 26.0–34.0)
MCHC: 35.1 g/dL (ref 30.0–36.0)
MCV: 95 fL (ref 80.0–100.0)
Monocytes Absolute: 0.5 10*3/uL (ref 0.1–1.0)
Monocytes Relative: 12 %
Neutro Abs: 1.9 10*3/uL (ref 1.7–7.7)
Neutrophils Relative %: 49 %
Platelets: 130 10*3/uL — ABNORMAL LOW (ref 150–400)
RBC: 3.6 MIL/uL — ABNORMAL LOW (ref 4.22–5.81)
RDW: 15.3 % (ref 11.5–15.5)
WBC: 3.8 10*3/uL — ABNORMAL LOW (ref 4.0–10.5)
nRBC: 0 % (ref 0.0–0.2)

## 2020-11-03 MED ORDER — SODIUM CHLORIDE 0.9% FLUSH
10.0000 mL | Freq: Once | INTRAVENOUS | Status: AC
  Administered 2020-11-03: 10 mL

## 2020-11-03 NOTE — Assessment & Plan Note (Signed)
Stable since last visit. We will continue to monitor.

## 2020-11-03 NOTE — Assessment & Plan Note (Signed)
This is a very pleasant 65 year old male patient with newly diagnosed metastatic pancreatic adenocarcinoma, initially started on FOLFIRINOX but did not tolerate well and hence the regimen has been switched to FOLFOX. 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 For this last chemotherapy, he got 5 FU alone. Since last visit, he had imaging which is concerning for progression.  We have recommended about transitioning to second line gemcitabine with nab-paclitaxel.  MSI testing was requested in May but apparently was not performed.  We have requested this again today.  His genetic testing is negative. Given his baseline neuropathy with the oxaliplatin, he clearly understands that this can continue to get worse and can become disabling but he would like to try 1 cycle of gemcitabine and nab-paclitaxel and then follow-up. I think this is a reasonable approach. Will anticipate starting Gemzar with nab-paclitaxel next week and he will return to clinic to see [place] in 2 to 3 weeks.

## 2020-11-03 NOTE — Progress Notes (Signed)
[place] FOLLOW UP NOTE  Patient Care Team: Timothy Pike, MD as PCP - General (Hematology and Oncology) Timothy Mayer, MD as Consulting Physician (Gastroenterology) Timothy Gala, MD as Consulting Physician (Otolaryngology) Timothy Snare, MD as Consulting Physician (Endocrinology) Timothy Pollack, MD as Consulting Physician (Cardiothoracic Surgery) Timothy Mallow, MD as Consulting Physician (Urology) Timothy Pickett Nadean Corwin, MD as Consulting Physician (Cardiology) Timothy Pike, MD as Consulting Physician (Hematology and Oncology) Timothy Finner, RN (Inactive) as Oncology Nurse Navigator  CHIEF COMPLAINTS/PURPOSE OF CONSULTATION:   Pancreatic cancer, follow up after C8 of 5 FU alone.  ASSESSMENT & PLAN:   Pancreatic adenocarcinoma [facility]) This is a very pleasant 65 year old male patient with newly diagnosed metastatic pancreatic adenocarcinoma, initially started on FOLFIRINOX but did not tolerate well and hence the regimen has been switched to FOLFOX. 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 For this last chemotherapy, he got 5 FU alone. Since last visit, he had imaging which is concerning for progression.  We have recommended about transitioning to second line gemcitabine with nab-paclitaxel.  MSI testing was requested in May but apparently was not performed.  We have requested this again today.  His genetic testing is negative. Given his baseline neuropathy with the oxaliplatin, he clearly understands that this can continue to get worse and can become disabling but he would like to try 1 cycle of gemcitabine and nab-paclitaxel and then follow-up. I think this is a reasonable approach. Will anticipate starting Gemzar with nab-paclitaxel next week and he will return to clinic to see [place] in 2 to 3 weeks.  Cancer related pain Continue as needed oxycodone.  Abdominal pain is very well controlled at this time.  Weight loss,  unintentional Stable since last visit. We will continue to monitor.  No orders of the defined types were placed in this encounter.    HISTORY OF PRESENTING ILLNESS:   Timothy Page 64 y.o. male is here because of new diagnosis of pancreatic cancer.  Oncology History  Adenocarcinoma of pancreas, stage 4 ([place])  06/20/2020 Initial Diagnosis   Adenocarcinoma of pancreas, stage 4 ([place])   07/01/2020 - 10/21/2020 Chemotherapy          07/01/2020 Genetic Testing   Negative genetic testing on a custom panel reviewing multicancer and pancreatitis genes.  The Custom-Gene Panel offered by [facility] includes sequencing and/or deletion duplication testing of the following 91 genes: AIP, ALK, APC*, ATM*, AXIN2, BAP1, BARD1, BLM, BMPR1A, BRCA1, BRCA2, BRIP1, CASR, CDC73, CDH1, CDK4, CDKN1B, CDKN1C, CDKN2A (p14ARF), CDKN2A (p16INK4a), CEBPA, CFTR*, CHEK2, CPA1, CTNNA1, CTRC, DICER1*, DIS3L2, EGFR, EPCAM*, FANCC, FH*, FLCN, GATA2, GPC3*, GREM1*, HOXB13, HRAS, KIT, MAX*, MEN1*, MET*, MITF*, MLH1*, MSH2*, MSH3*, MSH6*, MUTYH, NBN, NF1*, NF2, NTHL1, PALB2, PALLD, PDGFRA, PHOX2B*, PMS2*, POLD1*, POLE, POT1, PRKAR1A, PRSS1*, PTCH1, PTEN*, RAD50, RAD51C, RAD51D, RB1*, RECQL4*, RET, RUNX1, SDHA*, SDHAF2, SDHB, SDHC*, SDHD, SMAD4, SMARCA4, SMARCB1, SMARCE1, SPINK1, STK11, SUFU, TERC, TERT, TMEM127, TP53, TSC1*, TSC2, VHL, WRN*, and WT1.  The report date is Jul 01, 2020.    Imaging   Imaging after 4 cycles of therapy  IMPRESSION: 1. Pancreatic neck mass is stable. 2. No new or progressive metastatic disease. Tiny soft tissue nodule along the posteroinferior right liver capsule is stable. Solitary liver metastasis adjacent to the gallbladder is mildly decreased. Small bilateral pulmonary nodules are all stable. 3. New small nonocclusive thrombosis in the main portal vein. Persistent near occlusion of the proximal main portal vein with narrowing of the  portal splenic venous confluence. 4. Aortic Atherosclerosis  (ICD10-I70.0).    RX History  He got one cycle of FOLFIRINOX on 07/01/2020 Following this, we changed the regimen to FOLFOX given toxicity. FOLFOX continued from 07/23/2020 through 10/06/2020 Oxali discontinued given neuropathy Started 5 FU on 10/21/2020 Imaging with two new pulmonary nodules, suspicious for progressive metastatic disease. New hypoattenuating lesion within the posterior right hepatic lobe, favoring metastatic disease  INTERIM HISTORY  Timothy Page is here for a follow-up after cycle 8 of chemotherapy Patient is here for follow-up with his wife. Since last visit he has felt more tired, he thinks this is lack of premedication with steroids.  Besides fatigue, he denies any nausea or vomiting or diarrhea.  Pain in the abdomen was pretty well controlled except for a rough day yesterday.  He takes oxycodone once to twice a week.  He has not been using any morphine. No worsening cough, chest pain or SOB He still complains of some numbness in his mouth, some numbness at the tips of his fingers.  No falls since last visit. No constipation issues.  Rest of the pertinent 10 point ROS reviewed and negative.  MEDICAL HISTORY:  Past Medical History:  Diagnosis Date   Arthritis    Atypical chest pain 04/2017   ACS ruled out 05/11/17   CAP (community acquired pneumonia) 05/07/2017   Chronic constipation    at least partially d/t chronic opioids   Diabetes mellitus without complication ([place])    Poor control starting fall 2021-->GAD ab neg.  Insulin and C peptide levels wnl but on lower end of normal->Dr Kuma/endo 2022.   Exocrine pancreatic insufficiency 2022   History of thyroidectomy, total 2018   Hurlthe cell adenoma   Hx of adenomatous polyp of colon 07/02/2009   rpt colonoscopy 2026   Hyperlipidemia    Intol of multiple statins and zetia. Advanced lipid clinic 2022.   Hypertension    Lipoma    back   Myocardial infarction [facility])    MI at age 49   Pancreatic adenocarcinoma ([place])  06/2020   Mets to liver (?and lungs?->stage 4), w/acute pancreatitis.   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 Cammy Copa   Right ureteral calculus 04/2018   Cystoscopy with right retrograde pyelogram and right ureteral stent placement after stone removal.   Seasonal allergies    Stroke ([place])    in 64B no complications    Tachyarrhythmia 1980   age 7 X 2 over 21 month period.  No recurrence since that time.   Thoracic aortic aneurysm ([facility])    Incidentally discovered, 4.1 cm-->Dr. Cyndia Bent. Rpt CT 08/2018 and 08/2019 stable.   Vocal cord paralysis 07/2016   Left vocal cord paralysis noted after thyroid surgery.    SURGICAL HISTORY: Past Surgical History:  Procedure Laterality Date   BIOPSY  06/18/2020   Procedure: BIOPSY;  Surgeon: Mansouraty, Telford Nab., MD;  Location: [facility] ENDOSCOPY;  Service: Gastroenterology;;   [place] W/ POLYPECTOMY  07/02/2009; 01/24/20   2011 adenoma.  2021 adenomas x 3->recall 2026   CYSTOSCOPY WITH RETROGRADE PYELOGRAM, URETEROSCOPY AND STENT PLACEMENT Right 05/09/2018   Procedure: CYSTOSCOPY WITH RIGHT RETROGRADE RIGHT URETEROSCOPY STONE EXTRACTION RIGHT STENT EXCHANGE;  Surgeon: Timothy Mallow, MD;  Location:  [facility] ORS;  Service: Urology;  Laterality: Right;   CYSTOSCOPY/RETROGRADE/URETEROSCOPY/STONE EXTRACTION WITH BASKET Right 04/30/2018   Procedure: CYSTOSCOPY/RETROGRADE/RIGHT URETEROSCOPY/;  Surgeon: Timothy Mallow, MD;  Location: [facility] ORS;  Service: Urology;  Laterality: Right;   ESOPHAGOGASTRODUODENOSCOPY  07/02/2009   NORMAL   ESOPHAGOGASTRODUODENOSCOPY (EGD) WITH PROPOFOL N/A 06/18/2020   Procedure: ESOPHAGOGASTRODUODENOSCOPY (EGD) WITH PROPOFOL;  Surgeon: Rush Landmark Telford Nab., MD;  Location: [place];  Service: Gastroenterology;  Laterality: N/A;   EYE SURGERY     FINE NEEDLE ASPIRATION  06/18/2020   Procedure: FINE NEEDLE ASPIRATION (FNA) LINEAR;  Surgeon: Irving Copas., MD;  Location: [place];  Service: Gastroenterology;;   finger amputaion     with reattachement   INCISION AND DRAINAGE ABSCESS ANAL  11/23/2019   gen surg (In ED).   IR IMAGING GUIDED PORT INSERTION  06/22/2020   LASIK     THYROIDECTOMY N/A 08/03/2016   Procedure: Total THYROIDECTOMY;  Surgeon: Timothy Gala, MD;  Location: [place];  Service: ENT;  Laterality: N/A;  Total Thyroidectomy    UPPER ESOPHAGEAL ENDOSCOPIC ULTRASOUND (EUS) N/A 06/18/2020   Procedure: UPPER ESOPHAGEAL ENDOSCOPIC ULTRASOUND (EUS);  Surgeon: Irving Copas., MD;  Location: [place];  Service: Gastroenterology;  Laterality: N/A;   UPPER GASTROINTESTINAL ENDOSCOPY     with EUS and bx of pancreatic mass. +appearance of candida in esoph.      SOCIAL HISTORY: Social History   Socioeconomic History   Marital status: Married    Spouse name: Not on file   Number of children: Not on file   Years of education: Not on file   Highest education level: Not on file  Occupational History   Not on file  Tobacco Use   Smoking status: Never   Smokeless tobacco: Current    Types: Snuff  Vaping Use   Vaping Use: Never used  Substance and Sexual Activity   Alcohol use: No   Drug use: No   Sexual activity: Not on file  Other Topics Concern   Not on file  Social History Narrative   Married, 2 daughters, 1 grandson that lives with him.   Educ: some college   Occup: retired from the [employer].  Retired at 31 but returned to work at some point, now with grandson living with him.   No T/A/Ds.   Social Determinants of Health   Financial Resource Strain: Not on file  Food Insecurity: Not on file  Transportation Needs: Not on file  Physical Activity: Not on file  Stress: Not on file  Social Connections: Not on file   Intimate Partner Violence: Not on file    FAMILY HISTORY: Family History  Problem Relation Age of Onset   Diabetes Mother    Cancer Mother        lung   COPD Mother    Brain cancer Mother    Arthritis Mother    Hyperlipidemia Mother    Heart disease Mother    Hypertension Mother    Diabetes Father    Cancer Father        mesothelioma; skin   Mental illness Father    Arthritis Father    Hyperlipidemia Father    Heart disease Father    Hypertension Father    Heart disease Maternal Grandmother        MI @ 31   Heart disease Maternal Grandfather        MI @ 20   Diabetes Paternal Grandmother    Dementia Paternal Grandmother  Heart disease Paternal Grandfather        MI @ 53   Colon cancer Neg Hx    Esophageal cancer Neg Hx    Rectal cancer Neg Hx    Stomach cancer Neg Hx     ALLERGIES:  is allergic to atenolol, hydroxyzine, tetracycline, atorvastatin, codeine, guaifenesin, and mucinex [guaifenesin er].  MEDICATIONS:  Current Outpatient Medications  Medication Sig Dispense Refill   Continuous Blood Gluc Sensor (FREESTYLE LIBRE 2 SENSOR) MISC APPLY 1 SENSOR TO THE SKIN EVERY 14 DAYS AS DIRECTED 2 each 3   docusate sodium (COLACE) 100 MG capsule Take 100 mg by mouth 2 (two) times daily.     ELIQUIS 5 MG TABS tablet TAKE 1 TABLET(5 MG) BY MOUTH TWICE DAILY 60 tablet 1   HUMALOG KWIKPEN 100 UNIT/ML KwikPen Inject 20-22 Units into the skin daily.     insulin glargine (LANTUS SOLOSTAR) 100 UNIT/ML Solostar Pen Give up to 10 units under the skin every evening at bedtime. 15 mL 1   Insulin Pen Needle 31G X 5 MM MISC Use with insulin pen 3 times a day 100 each 1   lactulose (CHRONULAC) 10 GM/15ML solution Take 15 mLs (10 g total) by mouth 3 (three) times daily as needed for mild constipation. 236 mL 0   levothyroxine (SYNTHROID) 137 MCG tablet TAKE 1 TABLET BY MOUTH  DAILY BEFORE BREAKFAST 90 tablet 3   lipase/protease/amylase (CREON) 36000 UNITS CPEP capsule Take 2 capsules  (72,000 Units total) by mouth 3 (three) times daily with meals. May also take 1 capsule (36,000 Units total) as needed (with snacks). 240 capsule 11   magic mouthwash SOLN SWISH WITH 30 MLS BY MOUTH FOUR TIMES DAILY AND SWALLOW. NO FOOD OR DRINK FOR 1 HOUR AFTERWARDS     magnesium hydroxide (MILK OF MAGNESIA) 400 MG/5ML suspension Take by mouth daily as needed for mild constipation.     omeprazole (PRILOSEC) 20 MG capsule TAKE 1 CAPSULE(20 MG) BY MOUTH DAILY 30 capsule 0   oxyCODONE (OXY IR/ROXICODONE) 5 MG immediate release tablet Take 1 tablet (5 mg total) by mouth every 6 (six) hours as needed for severe pain or moderate pain. 60 tablet 0   polyethylene glycol (MIRALAX / GLYCOLAX) 17 g packet Take 17 g by mouth daily.     prochlorperazine (COMPAZINE) 10 MG tablet TAKE 1 TABLET(10 MG) BY MOUTH EVERY 6 HOURS AS NEEDED FOR NAUSEA OR VOMITING 30 tablet 1   ramipril (ALTACE) 10 MG capsule Take 10 mg by mouth 2 (two) times daily.     senna-docusate (SENOKOT-S) 8.6-50 MG tablet Take 1 tablet by mouth 2 (two) times daily.     No current facility-administered medications for this visit.   Facility-Administered Medications Ordered in Other Visits  Medication Dose Route Frequency Provider Last Rate Last Admin   LORazepam (ATIVAN) tablet 0.5 mg  0.5 mg Oral Once Truitt Merle, MD        PHYSICAL EXAMINATION: ECOG PERFORMANCE STATUS: 1 - Symptomatic but completely ambulatory  PE deferred in lieu of counseling  LABORATORY DATA:  I have reviewed the data as listed Lab Results  Component Value Date   WBC 3.8 (L) 11/03/2020   HGB 12.0 (L) 11/03/2020   HCT 34.2 (L) 11/03/2020   MCV 95.0 11/03/2020   PLT 130 (L) 11/03/2020     Chemistry      Component Value Date/Time   NA 141 11/03/2020 0748   K 4.0 11/03/2020 0748   CL 106 11/03/2020 0748  CO2 24 11/03/2020 0748   BUN 9 11/03/2020 0748   CREATININE 1.24 11/03/2020 0748   CREATININE 1.11 11/03/2017 1533      Component Value Date/Time    CALCIUM 9.5 11/03/2020 0748   ALKPHOS 106 11/03/2020 0748   AST 12 (L) 11/03/2020 0748   ALT 10 11/03/2020 0748   BILITOT 0.7 11/03/2020 0748     We have reviewed the imaging and compared to July imaging. Liver lesion is certainly suspicious for progressive disease. Two new small pulmonary nodules.  RADIOGRAPHIC STUDIES:  CT CHEST ABDOMEN PELVIS W CONTRAST  Result Date: 11/02/2020 CLINICAL DATA:  pancreatic cancer diagnosed in June. Chemotherapy in progress. Occasional abdominal pain and constipation. No chest complaints. EXAM: CT CHEST, ABDOMEN, AND PELVIS WITH CONTRAST TECHNIQUE: Multidetector CT imaging of the chest, abdomen and pelvis was performed following the standard protocol during bolus administration of intravenous contrast. CONTRAST:  47m OMNIPAQUE IOHEXOL 350 MG/ML SOLN COMPARISON:  08/26/2020 FINDINGS: CT CHEST FINDINGS Cardiovascular: Right Port-A-Cath tip high right atrium. Thyroidectomy. Aortic atherosclerosis. Tortuous thoracic aorta. Normal heart size, without pericardial effusion. Lad and right coronary artery calcification. No central pulmonary embolism, on this non-dedicated study. Mediastinum/Nodes: No supraclavicular adenopathy. No mediastinal or hilar adenopathy. Lungs/Pleura: No pleural fluid. Posterior right upper lobe irregular pulmonary nodule or area of nodularity including at 9 mm on 72/12, new. Right middle lobe subpleural pulmonary nodule of 5 mm on 109/12, new. Other pulmonary nodules, including within the left lower lobe at up to 5 mm x 2 on 88/12 are unchanged. Musculoskeletal: No acute osseous abnormality. CT ABDOMEN PELVIS FINDINGS Hepatobiliary: Vague area of arterial phase hyperenhancement within segment 4 B including on 36/2 is similar to on the prior. Development of a right hepatic lobe hypoattenuating 3.1 x 2.5 cm lesion on 168/11. The previously described pericholecystic liver lesion is not readily apparent today. There are new areas of pericholecystic  hypoattenuation including more posteriorly and laterally on 157/11, favored to represent focal steatosis. Normal gallbladder, without biliary ductal dilatation. Pancreas: Pancreatic duct dilatation and atrophy in the body and tail again identified. Example duct measurement at up to 9 mm today versus 8 mm on the prior. Continues to the level of an area of soft tissue fullness within the pancreatic head and neck. Somewhat difficult to differentiate from surrounding parenchyma, but felt to measure on the order of 2.8 x 2.5 cm on 160/11. Compare 3.7 x 2.8 cm on the prior exam. No superimposed acute pancreatitis. Spleen: Normal in size, without focal abnormality. Adrenals/Urinary Tract: Normal adrenal glands. Normal kidneys, without hydronephrosis. Normal urinary bladder. Stomach/Bowel: Proximal gastric underdistention. Moderate amount of stool within the rectum. Normal appendix and terminal ileum. Normal small bowel. Vascular/Lymphatic: Aortic atherosclerosis. Again identified is narrowing of the splenoportal confluence with resultant enlargement of gastroepiploic collaterals. The previously described nonocclusive main portal vein thrombus/filling defect is not confidently identified today. There is equivocal hypoattenuation in this area including on 154/11. Patent hepatic veins, SMV. No abdominopelvic adenopathy. Reproductive: Normal prostate. Other: No significant free fluid. No evidence of omental or peritoneal disease. Musculoskeletal: Degenerative partial fusion of both sacroiliac joints. IMPRESSION: 1. Two new pulmonary nodules, suspicious for progressive metastatic disease. Other pulmonary nodules are similar. Given somewhat ill-defined morphology of the posterior right upper lobe new nodule, recommend clinical exclusion of mild infection, felt less likely. 2. New hypoattenuating lesion within the posterior right hepatic lobe, favoring metastatic disease. Given areas of presumed focal steatosis adjacent the  gallbladder, rounded focal steatosis is possible but felt less likely.  If imaged differentiation is desired, pre and post-contrast MRI or PET would likely be definitive. 3. The pancreatic primary is slightly decreased and less well-defined today. 4. Otherwise, no new sites of metastatic disease. 5.  Possible constipation. 6. Coronary artery atherosclerosis. Aortic Atherosclerosis (ICD10-I70.0). Electronically Signed   By: Abigail Miyamoto M.D.   On: 11/02/2020 11:46    All questions were answered. The patient knows to call the clinic with any problems, questions or concerns.  I spent 30 minutes in the care of this patient including history and physical, review of records, counseling and coordination of care.  We have reviewed imaging, compared to prior imaging, discussed about 's of treatment such as gemcitabine and nab-paclitaxel, adverse effects and follow-up recommendations.   Timothy Pike, MD 11/03/2020 9:57 AM

## 2020-11-03 NOTE — Progress Notes (Signed)
DISCONTINUE ON PATHWAY REGIMEN - Pancreatic Adenocarcinoma     A cycle is every 14 days:     Oxaliplatin      Leucovorin      Irinotecan      Fluorouracil   **Always confirm dose/schedule in your pharmacy ordering system**  REASON: Disease Progression PRIOR TREATMENT: PANOS98: mFOLFIRINOX q14 Days Until Progression or Toxicity TREATMENT RESPONSE: Progressive Disease (PD)  START ON PATHWAY REGIMEN - Pancreatic Adenocarcinoma     A cycle is every 28 days:     Nab-paclitaxel (protein bound)      Gemcitabine   **Always confirm dose/schedule in your pharmacy ordering system**  Patient Characteristics: Metastatic Disease, Second Line, MSS/pMMR or MSI Unknown, Fluoropyrimidine-Based Therapy First Line Therapeutic Status: Metastatic Disease Line of Therapy: Second Line Microsatellite/Mismatch Repair Status: Unknown Intent of Therapy: Non-Curative / Palliative Intent, Discussed with Patient 

## 2020-11-03 NOTE — Addendum Note (Signed)
Addended by: Adaline Sill on: 11/03/2020 04:51 PM   Modules accepted: Orders

## 2020-11-03 NOTE — Assessment & Plan Note (Signed)
Continue as needed oxycodone.  Abdominal pain is very well controlled at this time.

## 2020-11-04 ENCOUNTER — Encounter: Payer: Self-pay | Admitting: Endocrinology

## 2020-11-04 LAB — CYTOLOGY - NON PAP

## 2020-11-05 ENCOUNTER — Inpatient Hospital Stay: Payer: 59

## 2020-11-09 NOTE — Progress Notes (Signed)
Pharmacist Chemotherapy Monitoring - Initial Assessment    Anticipated start date: 11/10/20   The following has been reviewed per standard work regarding the patient's treatment regimen: The patient's diagnosis, treatment plan and drug doses, and organ/hematologic function Lab orders and baseline tests specific to treatment regimen  The treatment plan start date, drug sequencing, and pre-medications Prior authorization status  Patient's documented medication list, including drug-drug interaction screen and prescriptions for anti-emetics and supportive care specific to the treatment regimen The drug concentrations, fluid compatibility, administration routes, and timing of the medications to be used The patient's access for treatment and lifetime cumulative dose history, if applicable  The patient's medication allergies and previous infusion related reactions, if applicable   Changes made to treatment plan:  N/A  Follow up needed:  N/A    Kennith Center, Pharm.D., CPP 11/09/2020@2 :34 PM

## 2020-11-10 ENCOUNTER — Inpatient Hospital Stay: Payer: 59

## 2020-11-10 ENCOUNTER — Other Ambulatory Visit: Payer: Self-pay

## 2020-11-10 VITALS — BP 154/91 | HR 67 | Temp 98.4°F | Resp 16 | Wt 192.8 lb

## 2020-11-10 DIAGNOSIS — C259 Malignant neoplasm of pancreas, unspecified: Secondary | ICD-10-CM

## 2020-11-10 DIAGNOSIS — Z95828 Presence of other vascular implants and grafts: Secondary | ICD-10-CM

## 2020-11-10 DIAGNOSIS — E1165 Type 2 diabetes mellitus with hyperglycemia: Secondary | ICD-10-CM

## 2020-11-10 DIAGNOSIS — Z5111 Encounter for antineoplastic chemotherapy: Secondary | ICD-10-CM | POA: Diagnosis not present

## 2020-11-10 DIAGNOSIS — Z794 Long term (current) use of insulin: Secondary | ICD-10-CM

## 2020-11-10 LAB — CMP (CANCER CENTER ONLY)
ALT: 8 U/L (ref 0–44)
AST: 10 U/L — ABNORMAL LOW (ref 15–41)
Albumin: 3.9 g/dL (ref 3.5–5.0)
Alkaline Phosphatase: 98 U/L (ref 38–126)
Anion gap: 10 (ref 5–15)
BUN: 15 mg/dL (ref 8–23)
CO2: 24 mmol/L (ref 22–32)
Calcium: 9.4 mg/dL (ref 8.9–10.3)
Chloride: 107 mmol/L (ref 98–111)
Creatinine: 1.1 mg/dL (ref 0.61–1.24)
GFR, Estimated: >60 mL/min (ref >60)
Glucose, Bld: 197 mg/dL — ABNORMAL HIGH (ref 70–99)
Potassium: 3.9 mmol/L (ref 3.5–5.1)
Sodium: 141 mmol/L (ref 135–145)
Total Bilirubin: 0.6 mg/dL (ref 0.3–1.2)
Total Protein: 6.4 g/dL — ABNORMAL LOW (ref 6.5–8.1)

## 2020-11-10 LAB — CBC WITH DIFFERENTIAL/PLATELET
Abs Immature Granulocytes: 0.01 10*3/uL (ref 0.00–0.07)
Basophils Absolute: 0 10*3/uL (ref 0.0–0.1)
Basophils Relative: 1 %
Eosinophils Absolute: 0.1 10*3/uL (ref 0.0–0.5)
Eosinophils Relative: 1 %
HCT: 35.8 % — ABNORMAL LOW (ref 39.0–52.0)
Hemoglobin: 12.4 g/dL — ABNORMAL LOW (ref 13.0–17.0)
Immature Granulocytes: 0 %
Lymphocytes Relative: 33 %
Lymphs Abs: 1.7 10*3/uL (ref 0.7–4.0)
MCH: 33 pg (ref 26.0–34.0)
MCHC: 34.6 g/dL (ref 30.0–36.0)
MCV: 95.2 fL (ref 80.0–100.0)
Monocytes Absolute: 0.4 10*3/uL (ref 0.1–1.0)
Monocytes Relative: 7 %
Neutro Abs: 3 10*3/uL (ref 1.7–7.7)
Neutrophils Relative %: 58 %
Platelets: 130 10*3/uL — ABNORMAL LOW (ref 150–400)
RBC: 3.76 MIL/uL — ABNORMAL LOW (ref 4.22–5.81)
RDW: 14.9 % (ref 11.5–15.5)
WBC: 5.1 10*3/uL (ref 4.0–10.5)
nRBC: 0 % (ref 0.0–0.2)

## 2020-11-10 MED ORDER — SODIUM CHLORIDE 0.9 % IV SOLN
1000.0000 mg/m2 | Freq: Once | INTRAVENOUS | Status: AC
  Administered 2020-11-10: 2052 mg via INTRAVENOUS
  Filled 2020-11-10: qty 53.97

## 2020-11-10 MED ORDER — PACLITAXEL PROTEIN-BOUND CHEMO INJECTION 100 MG
125.0000 mg/m2 | Freq: Once | INTRAVENOUS | Status: AC
  Administered 2020-11-10: 250 mg via INTRAVENOUS
  Filled 2020-11-10: qty 50

## 2020-11-10 MED ORDER — SODIUM CHLORIDE 0.9% FLUSH
10.0000 mL | Freq: Once | INTRAVENOUS | Status: AC
  Administered 2020-11-10: 10 mL

## 2020-11-10 MED ORDER — HEPARIN SOD (PORK) LOCK FLUSH 100 UNIT/ML IV SOLN: 500.0000 [IU] | Freq: Once | INTRAVENOUS | Status: DC | PRN

## 2020-11-10 MED ORDER — PROCHLORPERAZINE MALEATE 10 MG PO TABS
10.0000 mg | ORAL_TABLET | Freq: Once | ORAL | Status: AC
  Administered 2020-11-10: 10 mg via ORAL
  Filled 2020-11-10: qty 1

## 2020-11-10 MED ORDER — SODIUM CHLORIDE 0.9 % IV SOLN: Freq: Once | INTRAVENOUS | Status: AC

## 2020-11-10 MED ORDER — SODIUM CHLORIDE 0.9% FLUSH: 10.0000 mL | INTRAVENOUS | Status: DC | PRN

## 2020-11-10 NOTE — Patient Instructions (Signed)
[facility]  Discharge Instructions: Thank you for choosing [facility] to provide your oncology and hematology care.   If you have a lab appointment with the [facility], please go directly to the [facility] and check in at the registration area.   Wear comfortable clothing and clothing appropriate for easy access to any Portacath or PICC line.   We strive to give you quality time with your provider. You may need to reschedule your appointment if you arrive late (15 or more minutes).  Arriving late affects you and other patients whose appointments are after yours.  Also, if you miss three or more appointments without notifying the office, you may be dismissed from the clinic at the provider's discretion.      For prescription refill requests, have your pharmacy contact our office and allow 72 hours for refills to be completed.    Today you received the following chemotherapy and/or immunotherapy agents Abraxane and Gemzar      To help prevent nausea and vomiting after your treatment, we encourage you to take your nausea medication as directed.  BELOW ARE SYMPTOMS THAT SHOULD BE REPORTED IMMEDIATELY: *FEVER GREATER THAN 100.4 F (38 C) OR HIGHER *CHILLS OR SWEATING *NAUSEA AND VOMITING THAT IS NOT CONTROLLED WITH YOUR NAUSEA MEDICATION *UNUSUAL SHORTNESS OF BREATH *UNUSUAL BRUISING OR BLEEDING *URINARY PROBLEMS (pain or burning when urinating, or frequent urination) *BOWEL PROBLEMS (unusual diarrhea, constipation, pain near the anus) TENDERNESS IN MOUTH AND THROAT WITH OR WITHOUT PRESENCE OF ULCERS (sore throat, sores in mouth, or a toothache) UNUSUAL RASH, SWELLING OR PAIN  UNUSUAL VAGINAL DISCHARGE OR ITCHING   Items with * indicate a potential emergency and should be followed up as soon as possible or go to the Emergency Department if any problems should occur.  Please show the CHEMOTHERAPY ALERT CARD or IMMUNOTHERAPY ALERT CARD at  check-in to the Emergency Department and triage nurse.  Should you have questions after your visit or need to cancel or reschedule your appointment, please contact [facility]  Dept: [phone]  and follow the prompts.  Office hours are 8:00 a.m. to 4:30 p.m. Monday - Friday. Please note that voicemails left after 4:00 p.m. may not be returned until the following business day.  We are closed weekends and major holidays. You have access to a nurse at all times for urgent questions. Please call the main number to the clinic Dept: [phone] and follow the prompts.   For any non-urgent questions, you may also contact your provider using MyChart. We now offer e-Visits for anyone 8 and older to request care online for non-urgent symptoms. For details visit [email or web address].   Also download the MyChart app! Go to the app store, search "MyChart", open the app, select [facility], and log in with your MyChart username and password.  Due to Covid, a mask is required upon entering the hospital/clinic. If you do not have a mask, one will be given to you upon arrival. For doctor visits, patients may have 1 support person aged 63 or older with them. For treatment visits, patients cannot have anyone with them due to current Covid guidelines and our immunocompromised population.   Nanoparticle Albumin-Bound Paclitaxel injection What is this medication? NANOPARTICLE ALBUMIN-BOUND PACLITAXEL (Na no PAHR ti kuhl al BYOO muhn-bound PAK li TAX el) is a chemotherapy drug. It targets fast dividing cells, like cancer cells, and causes these cells to die. This medicine is used to treat  advanced breast cancer, lung cancer, and pancreatic cancer. This medicine may be used for other purposes; ask your health care provider or pharmacist if you have questions. COMMON BRAND NAME(S): Abraxane What should I tell my care team before I take this medication? They need to know if you have any  of these conditions: kidney disease liver disease low blood counts, like low white cell, platelet, or red cell counts lung or breathing disease, like asthma tingling of the fingers or toes, or other nerve disorder an unusual or allergic reaction to paclitaxel, albumin, other chemotherapy, other medicines, foods, dyes, or preservatives pregnant or trying to get pregnant breast-feeding How should I use this medication? This drug is given as an infusion into a vein. It is administered in a hospital or clinic by a specially trained health care professional. Talk to your pediatrician regarding the use of this medicine in children. Special care may be needed. Overdosage: If you think you have taken too much of this medicine contact a poison control center or emergency room at once. NOTE: This medicine is only for you. Do not share this medicine with others. What if I miss a dose? It is important not to miss your dose. Call your doctor or health care professional if you are unable to keep an appointment. What may interact with this medication? This medicine may interact with the following medications: antiviral medicines for hepatitis, HIV or AIDS certain antibiotics like erythromycin and clarithromycin certain medicines for fungal infections like ketoconazole and itraconazole certain medicines for seizures like carbamazepine, phenobarbital, phenytoin gemfibrozil nefazodone rifampin St. John's wort This list may not describe all possible interactions. Give your health care provider a list of all the medicines, herbs, non-prescription drugs, or dietary supplements you use. Also tell them if you smoke, drink alcohol, or use illegal drugs. Some items may interact with your medicine. What should I watch for while using this medication? Your condition will be monitored carefully while you are receiving this medicine. You will need important blood work done while you are taking this medicine. This  medicine can cause serious allergic reactions. If you experience allergic reactions like skin rash, itching or hives, swelling of the face, lips, or tongue, tell your doctor or health care professional right away. In some cases, you may be given additional medicines to help with side effects. Follow all directions for their use. This drug may make you feel generally unwell. This is not uncommon, as chemotherapy can affect healthy cells as well as cancer cells. Report any side effects. Continue your course of treatment even though you feel ill unless your doctor tells you to stop. Call your doctor or health care professional for advice if you get a fever, chills or sore throat, or other symptoms of a cold or flu. Do not treat yourself. This drug decreases your body's ability to fight infections. Try to avoid being around people who are sick. This medicine may increase your risk to bruise or bleed. Call your doctor or health care professional if you notice any unusual bleeding. Be careful brushing and flossing your teeth or using a toothpick because you may get an infection or bleed more easily. If you have any dental work done, tell your dentist you are receiving this medicine. Avoid taking products that contain aspirin, acetaminophen, ibuprofen, naproxen, or ketoprofen unless instructed by your doctor. These medicines may hide a fever. Do not become pregnant while taking this medicine or for 6 months after stopping it. Women should inform their  doctor if they wish to become pregnant or think they might be pregnant. Men should not father a child while taking this medicine or for 3 months after stopping it. There is a potential for serious side effects to an unborn child. Talk to your health care professional or pharmacist for more information. Do not breast-feed an infant while taking this medicine or for 2 weeks after stopping it. This medicine may interfere with the ability to get pregnant or to father a  child. You should talk to your doctor or health care professional if you are concerned about your fertility. What side effects may I notice from receiving this medication? Side effects that you should report to your doctor or health care professional as soon as possible: allergic reactions like skin rash, itching or hives, swelling of the face, lips, or tongue breathing problems changes in vision fast, irregular heartbeat low blood pressure mouth sores pain, tingling, numbness in the hands or feet signs of decreased platelets or bleeding - bruising, pinpoint red spots on the skin, black, tarry stools, blood in the urine signs of decreased red blood cells - unusually weak or tired, feeling faint or lightheaded, falls signs of infection - fever or chills, cough, sore throat, pain or difficulty passing urine signs and symptoms of liver injury like dark yellow or brown urine; general ill feeling or flu-like symptoms; light-colored stools; loss of appetite; nausea; right upper belly pain; unusually weak or tired; yellowing of the eyes or skin swelling of the ankles, feet, hands unusually slow heartbeat Side effects that usually do not require medical attention (report to your doctor or health care professional if they continue or are bothersome): diarrhea hair loss loss of appetite nausea, vomiting tiredness This list may not describe all possible side effects. Call your doctor for medical advice about side effects. You may report side effects to FDA at 1-800-FDA-1088. Where should I keep my medication? This drug is given in a hospital or clinic and will not be stored at home. NOTE: This sheet is a summary. It may not cover all possible information. If you have questions about this medicine, talk to your doctor, pharmacist, or health care provider.  2022 [employer]/Gold Standard (2016-10-04 13:03:45)  Gemcitabine injection What is this medication? GEMCITABINE (jem SYE ta been) is a  chemotherapy drug. This medicine is used to treat many types of cancer like breast cancer, lung cancer, pancreatic cancer, and ovarian cancer. This medicine may be used for other purposes; ask your health care provider or pharmacist if you have questions. COMMON BRAND NAME(S): Gemzar, Infugem What should I tell my care team before I take this medication? They need to know if you have any of these conditions: blood disorders infection kidney disease liver disease lung or breathing disease, like asthma recent or ongoing radiation therapy an unusual or allergic reaction to gemcitabine, other chemotherapy, other medicines, foods, dyes, or preservatives pregnant or trying to get pregnant breast-feeding How should I use this medication? This drug is given as an infusion into a vein. It is administered in a hospital or clinic by a specially trained health care professional. Talk to your pediatrician regarding the use of this medicine in children. Special care may be needed. Overdosage: If you think you have taken too much of this medicine contact a poison control center or emergency room at once. NOTE: This medicine is only for you. Do not share this medicine with others. What if I miss a dose? It is important not to miss  your dose. Call your doctor or health care professional if you are unable to keep an appointment. What may interact with this medication? medicines to increase blood counts like filgrastim, pegfilgrastim, sargramostim some other chemotherapy drugs like cisplatin vaccines Talk to your doctor or health care professional before taking any of these medicines: acetaminophen aspirin ibuprofen ketoprofen naproxen This list may not describe all possible interactions. Give your health care provider a list of all the medicines, herbs, non-prescription drugs, or dietary supplements you use. Also tell them if you smoke, drink alcohol, or use illegal drugs. Some items may interact with  your medicine. What should I watch for while using this medication? Visit your doctor for checks on your progress. This drug may make you feel generally unwell. This is not uncommon, as chemotherapy can affect healthy cells as well as cancer cells. Report any side effects. Continue your course of treatment even though you feel ill unless your doctor tells you to stop. In some cases, you may be given additional medicines to help with side effects. Follow all directions for their use. Call your doctor or health care professional for advice if you get a fever, chills or sore throat, or other symptoms of a cold or flu. Do not treat yourself. This drug decreases your body's ability to fight infections. Try to avoid being around people who are sick. This medicine may increase your risk to bruise or bleed. Call your doctor or health care professional if you notice any unusual bleeding. Be careful brushing and flossing your teeth or using a toothpick because you may get an infection or bleed more easily. If you have any dental work done, tell your dentist you are receiving this medicine. Avoid taking products that contain aspirin, acetaminophen, ibuprofen, naproxen, or ketoprofen unless instructed by your doctor. These medicines may hide a fever. Do not become pregnant while taking this medicine or for 6 months after stopping it. Women should inform their doctor if they wish to become pregnant or think they might be pregnant. Men should not father a child while taking this medicine and for 3 months after stopping it. There is a potential for serious side effects to an unborn child. Talk to your health care professional or pharmacist for more information. Do not breast-feed an infant while taking this medicine or for at least 1 week after stopping it. Men should inform their doctors if they wish to father a child. This medicine may lower sperm counts. Talk with your doctor or health care professional if you are  concerned about your fertility. What side effects may I notice from receiving this medication? Side effects that you should report to your doctor or health care professional as soon as possible: allergic reactions like skin rash, itching or hives, swelling of the face, lips, or tongue breathing problems pain, redness, or irritation at site where injected signs and symptoms of a dangerous change in heartbeat or heart rhythm like chest pain; dizziness; fast or irregular heartbeat; palpitations; feeling faint or lightheaded, falls; breathing problems signs of decreased platelets or bleeding - bruising, pinpoint red spots on the skin, black, tarry stools, blood in the urine signs of decreased red blood cells - unusually weak or tired, feeling faint or lightheaded, falls signs of infection - fever or chills, cough, sore throat, pain or difficulty passing urine signs and symptoms of kidney injury like trouble passing urine or change in the amount of urine signs and symptoms of liver injury like dark yellow or brown urine;  general ill feeling or flu-like symptoms; light-colored stools; loss of appetite; nausea; right upper belly pain; unusually weak or tired; yellowing of the eyes or skin swelling of ankles, feet, hands Side effects that usually do not require medical attention (report to your doctor or health care professional if they continue or are bothersome): constipation diarrhea hair loss loss of appetite nausea rash vomiting This list may not describe all possible side effects. Call your doctor for medical advice about side effects. You may report side effects to FDA at 1-800-FDA-1088. Where should I keep my medication? This drug is given in a hospital or clinic and will not be stored at home. NOTE: This sheet is a summary. It may not cover all possible information. If you have questions about this medicine, talk to your doctor, pharmacist, or health care provider.  2022 [employer]vier/Gold  Standard (2017-04-26 18:06:11)

## 2020-11-13 ENCOUNTER — Inpatient Hospital Stay: Payer: 59

## 2020-11-13 ENCOUNTER — Other Ambulatory Visit: Payer: 59

## 2020-11-17 ENCOUNTER — Other Ambulatory Visit: Payer: Self-pay

## 2020-11-17 ENCOUNTER — Ambulatory Visit (INDEPENDENT_AMBULATORY_CARE_PROVIDER_SITE_OTHER): Payer: 59 | Admitting: Endocrinology

## 2020-11-17 VITALS — BP 130/82 | HR 73 | Ht 69.0 in | Wt 193.6 lb

## 2020-11-17 DIAGNOSIS — Z794 Long term (current) use of insulin: Secondary | ICD-10-CM

## 2020-11-17 DIAGNOSIS — E1165 Type 2 diabetes mellitus with hyperglycemia: Secondary | ICD-10-CM | POA: Diagnosis not present

## 2020-11-17 DIAGNOSIS — E890 Postprocedural hypothyroidism: Secondary | ICD-10-CM

## 2020-11-17 NOTE — Patient Instructions (Addendum)
18 Lantus at supper and keep am sugar 120-140  Take Humalog even 5-7 units when finishing any meal  Check blood sugars on waking up 7 days a week and before meals      Also check blood sugars about 2 hours after meals and do this after different meals by rotation  Recommended blood sugar levels on waking up are 90-130 and about 2 hours after meal is 130-180  Please bring your blood sugar monitor to each visit, thank you

## 2020-11-17 NOTE — Progress Notes (Signed)
Patient ID: Timothy Page, male   DOB: 01-01-1956, 65 y.o.   MRN: [id number]            Reason for Appointment: Endocrinology follow-up    History of Present Illness:     DIABETES diagnosis date:  2014  Previous history: He had been on Metformin for most of the duration of the diabetes A1c had been usually between 6.2 and 7.2 until 08/2018  Most recent A1c is 8.3  Insulin regimen:    Lantus 15 units daily at bedtime, Humalog usually 18 units 3 times daily  Recent history:     He has been monitoring with the freestyle libre but has been checking his blood sugars very infrequently now  He says that since he is not taking any steroids for his chemotherapy his blood sugars are better  He felt that his sugars are getting low overnight around 2 AM and he reduced his Lantus by 3 units and is only taking it at bedtime based on his blood sugar reading  However his highest blood sugars appear to be on an average overnight and early morning now  He was told to take his Humalog consistently before starting to eat and he thinks he is doing this more often Probably skipping some doses still or taking shots because of all.  His blood sugars at various times  In the morning he will sometimes eat a biscuit without anything else but may not take any insulin for this  Also occasionally possibly from eating less than his planned meal he will get low normal sugar late afternoon  Overall appetite is decreased  Non-insulin hypoglycemic drugs: None           Side effects from medications:  Diarrhea from high-dose Metformin      Analysis of blood sugar patterns from freestyle libre version 2 as follows  HIGHEST blood sugars are overnight although data is incomplete late at night and lowest blood sugars are before dinnertime  Hypoglycemic episodes are occurring periodically after meals including breakfast and dinnertime but not consistently, some of the data is limited by lack of monitoring at  bedtime Overnight blood sugars are mostly high and morning reading below normal only once No hypoglycemia with A1c has been below normal reading around 7 PM following a postprandial insulin injection dose Generally postprandial readings appear to be higher than Premeal readings to variable extent  CGM use % of time 52  2-week average/GV 173  Time in range     55   %  % Time Above 180 41  % Time above 250 4  % Time Below 70 0     PRE-MEAL Fasting Lunch Dinner Bedtime Overall  Glucose range:       Averages: 167 176 145 229    POST-MEAL PC Breakfast PC Lunch PC Dinner  Glucose range:     Averages: 202  174    Previously:   CGM use % of time 85  2-week average/GV 201+/-34  Time in range       39%  % Time Above 180 38+22  % Time above 250   % Time Below 70 1     PRE-MEAL Fasting Lunch Dinner Bedtime Overall  Glucose range:       Averages: 178   200    POST-MEAL PC Breakfast PC Lunch PC Dinner  Glucose range:     Averages: 177 197 220      Weight control:  Wt Readings from Last  3 Encounters:  11/17/20 193 lb 9.6 oz (87.8 kg)  11/10/20 192 lb 13 oz (87.5 kg)  11/03/20 189 lb 8 oz (86 kg)   Lab Results  Component Value Date   HGBA1C 8.3 (A) 08/10/2020   HGBA1C 7.7 (H) 06/12/2020   HGBA1C 8.5 (H) 01/29/2020   Lab Results  Component Value Date   MICROALBUR 0.7 10/30/2019   LDLCALC 103 (H) 06/15/2020   CREATININE 1.10 11/10/2020   Lab Results  Component Value Date   FRUCTOSAMINE 333 (H) 04/17/2020     POSTSURGICAL HYPOTHYROIDISM:  Prior to surgery he probably had a history of goiter followed by his PCP In June 2018 patient noticed significant swelling of his neck along with fever and also had cold symptoms Since he started having difficulty breathing he went to the emergency room and was hospitalized Since he had tracheal compression he had thyroidectomy done by 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  Patient was discharged on 150 g  of levothyroxine However since TSH was 0.1 this was reduced down to 137 g on his initial consultation in 7/18  RECENT history:  He is very regular with taking his levothyroxine 137 mcg before breakfast daily with water  TSH is normal consistently even with his tendency to weight loss likely  Wt Readings from Last 3 Encounters:  11/17/20 193 lb 9.6 oz (87.8 kg)  11/10/20 192 lb 13 oz (87.5 kg)  11/03/20 189 lb 8 oz (86 kg)     Lab Results  Component Value Date   TSH 0.74 08/10/2020   TSH 1.13 04/02/2020   TSH 1.35 10/30/2019   FREET4 1.17 04/02/2020   FREET4 1.26 10/30/2019   FREET4 1.20 03/01/2019     Allergies as of 11/17/2020       Reactions   Atenolol    Severe "slowing"of functioning Severe "slowing"of functioning   Hydroxyzine Other (See Comments)   "felt like I was coming out of my skin"   Tetracycline    Rash Because of a history of documented adverse serious drug reaction;Medi Alert bracelet  is recommended Rash Because of a history of documented adverse serious drug reaction;Medi Alert bracelet  is recommended   Atorvastatin    D/Ced by him due to Myalgias in Sept 2014 D/Ced by him due to Myalgias in Sept 2014   Codeine    Hallucinations. "I see pink elephants"   Guaifenesin    Other reaction(s): Other (See Comments) unknown unknown   Mucinex [guaifenesin Er] Other (See Comments)   Hallucinations "I see pink elephants"        Medication List        Accurate as of November 17, 2020 12:05 PM. If you have any questions, ask your nurse or doctor.          docusate sodium 100 MG capsule Commonly known as: COLACE Take 100 mg by mouth 2 (two) times daily.   Eliquis 5 MG Tabs tablet Generic drug: apixaban TAKE 1 TABLET(5 MG) BY MOUTH TWICE DAILY   FreeStyle Libre 2 Sensor Misc APPLY 1 SENSOR TO THE SKIN EVERY 14 DAYS AS DIRECTED   HumaLOG KwikPen 100 UNIT/ML KwikPen Generic drug: insulin lispro Inject 20-22 Units into the skin daily.    Insulin Pen Needle 31G X 5 MM Misc Use with insulin pen 3 times a day   lactulose 10 GM/15ML solution Commonly known as: CHRONULAC Take 15 mLs (10 g total) by mouth 3 (three) times daily as needed for mild constipation.  Lantus SoloStar 100 UNIT/ML Solostar Pen Generic drug: insulin glargine Give up to 10 units under the skin every evening at bedtime.   levothyroxine 137 MCG tablet Commonly known as: SYNTHROID TAKE 1 TABLET BY MOUTH  DAILY BEFORE BREAKFAST   lipase/protease/amylase 36000 UNITS Cpep capsule Commonly known as: Creon Take 2 capsules (72,000 Units total) by mouth 3 (three) times daily with meals. May also take 1 capsule (36,000 Units total) as needed (with snacks).   magic mouthwash Soln SWISH WITH 30 MLS BY MOUTH FOUR TIMES DAILY AND SWALLOW. NO FOOD OR DRINK FOR 1 HOUR AFTERWARDS   magnesium hydroxide 400 MG/5ML suspension Commonly known as: MILK OF MAGNESIA Take by mouth daily as needed for mild constipation.   omeprazole 20 MG capsule Commonly known as: PRILOSEC TAKE 1 CAPSULE(20 MG) BY MOUTH DAILY   oxyCODONE 5 MG immediate release tablet Commonly known as: Oxy IR/ROXICODONE Take 1 tablet (5 mg total) by mouth every 6 (six) hours as needed for severe pain or moderate pain.   polyethylene glycol 17 g packet Commonly known as: MIRALAX / GLYCOLAX Take 17 g by mouth daily.   prochlorperazine 10 MG tablet Commonly known as: COMPAZINE TAKE 1 TABLET(10 MG) BY MOUTH EVERY 6 HOURS AS NEEDED FOR NAUSEA OR VOMITING   ramipril 10 MG capsule Commonly known as: ALTACE Take 10 mg by mouth 2 (two) times daily.   senna-docusate 8.6-50 MG tablet Commonly known as: Senokot-S Take 1 tablet by mouth 2 (two) times daily.        Allergies:  Allergies  Allergen Reactions   Atenolol     Severe "slowing"of functioning Severe "slowing"of functioning   Hydroxyzine Other (See Comments)    "felt like I was coming out of my skin"   Tetracycline     Rash Because  of a history of documented adverse serious drug reaction;Medi Alert bracelet  is recommended Rash Because of a history of documented adverse serious drug reaction;Medi Alert bracelet  is recommended   Atorvastatin     D/Ced by him due to Myalgias in Sept 2014 D/Ced by him due to Myalgias in Sept 2014   Codeine     Hallucinations. "I see pink elephants"   Guaifenesin     Other reaction(s): Other (See Comments) unknown unknown   Mucinex [Guaifenesin Er] Other (See Comments)    Hallucinations "I see pink elephants"    Past Medical History:  Diagnosis Date   Arthritis    Atypical chest pain 04/2017   ACS ruled out 05/11/17   CAP (community acquired pneumonia) 05/07/2017   Chronic constipation    at least partially d/t chronic opioids   Diabetes mellitus without complication ([place])    Poor control starting fall 2021-->GAD ab neg.  Insulin and C peptide levels wnl but on lower end of normal->Dr Kuma/endo 2022.   Exocrine pancreatic insufficiency 2022   History of thyroidectomy, total 2018   Hurlthe cell adenoma   Hx of adenomatous polyp of colon 07/02/2009   rpt colonoscopy 2026   Hyperlipidemia    Intol of multiple statins and zetia. [facility] 2022.   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 Cammy Copa   Right ureteral calculus 04/2018   Cystoscopy with right retrograde pyelogram and right ureteral stent placement after stone removal.   Seasonal allergies    Stroke ([place])    in 40J no complications    Tachyarrhythmia 1980   age 63 X 2 over 18 month period.  No  recurrence since that time.   Thoracic aortic aneurysm ([facility])    Incidentally discovered, 4.1 cm-->Dr. Cyndia Bent. Rpt CT 08/2018 and 08/2019 stable.   Vocal cord paralysis 07/2016   Left vocal cord paralysis noted after thyroid surgery.    Past Surgical History:  Procedure Laterality Date   BIOPSY  06/18/2020   Procedure: BIOPSY;  Surgeon: Rush Landmark Telford Nab., MD;  Location: [facility] ENDOSCOPY;  Service: Gastroenterology;;   [place] W/ POLYPECTOMY  07/02/2009; 01/24/20   2011 adenoma.  2021 adenomas x 3->recall 2026   CYSTOSCOPY WITH RETROGRADE PYELOGRAM, URETEROSCOPY AND STENT PLACEMENT Right 05/09/2018   Procedure: CYSTOSCOPY WITH RIGHT RETROGRADE RIGHT URETEROSCOPY STONE EXTRACTION RIGHT STENT EXCHANGE;  Surgeon: Lucas Mallow, MD;  Location: [facility] ORS;  Service: Urology;  Laterality: Right;   CYSTOSCOPY/RETROGRADE/URETEROSCOPY/STONE EXTRACTION WITH BASKET Right 04/30/2018   Procedure: CYSTOSCOPY/RETROGRADE/RIGHT URETEROSCOPY/;  Surgeon: Lucas Mallow, MD;  Location: [facility] ORS;  Service: Urology;  Laterality: Right;   ESOPHAGOGASTRODUODENOSCOPY  07/02/2009   NORMAL   ESOPHAGOGASTRODUODENOSCOPY (EGD) WITH PROPOFOL N/A 06/18/2020   Procedure: ESOPHAGOGASTRODUODENOSCOPY (EGD) WITH PROPOFOL;  Surgeon: Rush Landmark Telford Nab., MD;  Location: [place];  Service: Gastroenterology;  Laterality: N/A;   EYE SURGERY     FINE NEEDLE ASPIRATION  06/18/2020   Procedure: FINE NEEDLE ASPIRATION (FNA) LINEAR;  Surgeon: Irving Copas., MD;  Location: [place];  Service: Gastroenterology;;   finger amputaion     with reattachement   INCISION AND DRAINAGE ABSCESS ANAL  11/23/2019   gen surg (In ED).   IR IMAGING GUIDED PORT INSERTION  06/22/2020   LASIK     THYROIDECTOMY N/A 08/03/2016   Procedure: Total THYROIDECTOMY;  Surgeon: [name], MD;  Location: [place];  Service: ENT;  Laterality: N/A;  Total Thyroidectomy    UPPER ESOPHAGEAL ENDOSCOPIC ULTRASOUND  (EUS) N/A 06/18/2020   Procedure: UPPER ESOPHAGEAL ENDOSCOPIC ULTRASOUND (EUS);  Surgeon: Irving Copas., MD;  Location: [place];  Service: Gastroenterology;  Laterality: N/A;   UPPER GASTROINTESTINAL ENDOSCOPY     with EUS and bx of pancreatic mass. +appearance of candida in esoph.      Family History  Problem Relation Age of Onset   Diabetes Mother    Cancer Mother        lung   COPD Mother    Brain cancer Mother    Arthritis Mother    Hyperlipidemia Mother    Heart disease Mother    Hypertension Mother    Diabetes Father    Cancer Father        mesothelioma; skin   Mental illness Father    Arthritis Father    Hyperlipidemia Father    Heart disease Father    Hypertension Father    Heart disease Maternal Grandmother        MI @ 46   Heart disease Maternal Grandfather        MI @ 45   Diabetes Paternal Grandmother    Dementia Paternal Grandmother    Heart disease Paternal Grandfather  MI @ 54   Colon cancer Neg Hx    Esophageal cancer Neg Hx    Rectal cancer Neg Hx    Stomach cancer Neg Hx     Social History:  reports that he has never smoked. His smokeless tobacco use includes snuff. He reports that he does not drink alcohol and does not use drugs.    Review of Systems  HYPERTENSION: Managed with ramipril Now followed by his PCP, also has a BP monitor at home   BP Readings from Last 3 Encounters:  11/17/20 130/82  11/10/20 (!) 154/91  11/03/20 (!) 164/92   Lab Results  Component Value Date   K 3.9 11/10/2020    is on a new chemotherapy for metastatic prostate cancer    Examination:   BP 130/82   Pulse 73   Ht 5\' 9"  (1.753 m)   Wt 193 lb 9.6 oz (87.8 kg)   SpO2 98%   BMI 28.59 kg/m      Assessment/Plan:   DIABETES type II  He is currently on a basal bolus insulin  His A1c is last 8.3  He is on basal bolus insulin  Monitoring with the freestyle libre  Recently but not taking steroids his blood sugars are easier to  control but still getting only 55% with readings within target   He has been afraid of low sugars overnight and is not taking enough basal insulin  Also because of variable appetite he has difficulty figuring out how much to take for his meals   His blood sugar abnormalities and insulin doses and timing were discussed in detail today Discussed blood sugar targets fasting and overnight as well as postprandial Variability in his blood sugars and proper timing with taking the mealtime insulin was discussed  Recommendations: He can go up to 18 units of the Lantus for now  Discussed that he is not getting low sugars overnight and he can keep his fasting readings between 120-150  He should start taking the Humalog right after eating based on what he is consuming other than his blood sugar level  For small meal intake such as a biscuit in the morning and take 5 to 7 units  Also avoid taking Humalog more than 20 minutes after eating  Take Lantus at suppertime to be continued and also avoiding late night blood sugars being higher Balanced meals with some protein at each meal No need for Lantus in the morning He can try diabetic boost supplement but may still need coverage if this has carbohydrates   Hypothyroidism: To have TSH checked to be drawn by cancer center  Hypokalemia: Resolved  Patient Instructions  18 Lantus at supper and keep am sugar 120-140  Take Humalog even 5-7 units when finishing any meal  Check blood sugars on waking up 7 days a week and before meals      Also check blood sugars about 2 hours after meals and do this after different meals by rotation  Recommended blood sugar levels on waking up are 90-130 and about 2 hours after meal is 130-180  Please bring your blood sugar monitor to each visit, thank you   Elayne Snare 11/17/2020  Addendum: TSH normal

## 2020-11-24 ENCOUNTER — Other Ambulatory Visit: Payer: Self-pay

## 2020-11-25 ENCOUNTER — Inpatient Hospital Stay: Payer: 59 | Attending: Genetic Counselor

## 2020-11-25 ENCOUNTER — Other Ambulatory Visit: Payer: Self-pay

## 2020-11-25 ENCOUNTER — Inpatient Hospital Stay: Payer: 59

## 2020-11-25 ENCOUNTER — Encounter: Payer: Self-pay | Admitting: Hematology and Oncology

## 2020-11-25 ENCOUNTER — Inpatient Hospital Stay (HOSPITAL_BASED_OUTPATIENT_CLINIC_OR_DEPARTMENT_OTHER): Payer: 59 | Admitting: Hematology and Oncology

## 2020-11-25 VITALS — BP 138/78

## 2020-11-25 DIAGNOSIS — F418 Other specified anxiety disorders: Secondary | ICD-10-CM

## 2020-11-25 DIAGNOSIS — E890 Postprocedural hypothyroidism: Secondary | ICD-10-CM

## 2020-11-25 DIAGNOSIS — G620 Drug-induced polyneuropathy: Secondary | ICD-10-CM

## 2020-11-25 DIAGNOSIS — E1165 Type 2 diabetes mellitus with hyperglycemia: Secondary | ICD-10-CM | POA: Diagnosis not present

## 2020-11-25 DIAGNOSIS — G893 Neoplasm related pain (acute) (chronic): Secondary | ICD-10-CM

## 2020-11-25 DIAGNOSIS — C259 Malignant neoplasm of pancreas, unspecified: Secondary | ICD-10-CM

## 2020-11-25 DIAGNOSIS — Z5111 Encounter for antineoplastic chemotherapy: Secondary | ICD-10-CM | POA: Insufficient documentation

## 2020-11-25 DIAGNOSIS — C7800 Secondary malignant neoplasm of unspecified lung: Secondary | ICD-10-CM | POA: Insufficient documentation

## 2020-11-25 DIAGNOSIS — Z95828 Presence of other vascular implants and grafts: Secondary | ICD-10-CM

## 2020-11-25 DIAGNOSIS — C787 Secondary malignant neoplasm of liver and intrahepatic bile duct: Secondary | ICD-10-CM | POA: Insufficient documentation

## 2020-11-25 DIAGNOSIS — E039 Hypothyroidism, unspecified: Secondary | ICD-10-CM | POA: Diagnosis not present

## 2020-11-25 DIAGNOSIS — T451X5A Adverse effect of antineoplastic and immunosuppressive drugs, initial encounter: Secondary | ICD-10-CM

## 2020-11-25 LAB — CMP (CANCER CENTER ONLY)
ALT: 11 U/L (ref 0–44)
AST: 15 U/L (ref 15–41)
Albumin: 4.1 g/dL (ref 3.5–5.0)
Alkaline Phosphatase: 80 U/L (ref 38–126)
Anion gap: 6 (ref 5–15)
BUN: 10 mg/dL (ref 8–23)
CO2: 26 mmol/L (ref 22–32)
Calcium: 8.9 mg/dL (ref 8.9–10.3)
Chloride: 109 mmol/L (ref 98–111)
Creatinine: 0.81 mg/dL (ref 0.61–1.24)
GFR, Estimated: >60 mL/min (ref >60)
Glucose, Bld: 121 mg/dL — ABNORMAL HIGH (ref 70–99)
Potassium: 3.4 mmol/L — ABNORMAL LOW (ref 3.5–5.1)
Sodium: 141 mmol/L (ref 135–145)
Total Bilirubin: 0.8 mg/dL (ref 0.3–1.2)
Total Protein: 6.6 g/dL (ref 6.5–8.1)

## 2020-11-25 LAB — CBC WITH DIFFERENTIAL/PLATELET
Abs Immature Granulocytes: 0.01 10*3/uL (ref 0.00–0.07)
Basophils Absolute: 0 10*3/uL (ref 0.0–0.1)
Basophils Relative: 1 %
Eosinophils Absolute: 0.1 10*3/uL (ref 0.0–0.5)
Eosinophils Relative: 1 %
HCT: 32.9 % — ABNORMAL LOW (ref 39.0–52.0)
Hemoglobin: 11.5 g/dL — ABNORMAL LOW (ref 13.0–17.0)
Immature Granulocytes: 0 %
Lymphocytes Relative: 24 %
Lymphs Abs: 1.3 10*3/uL (ref 0.7–4.0)
MCH: 33.9 pg (ref 26.0–34.0)
MCHC: 35 g/dL (ref 30.0–36.0)
MCV: 97.1 fL (ref 80.0–100.0)
Monocytes Absolute: 0.4 10*3/uL (ref 0.1–1.0)
Monocytes Relative: 6 %
Neutro Abs: 3.9 10*3/uL (ref 1.7–7.7)
Neutrophils Relative %: 68 %
Platelets: 176 10*3/uL (ref 150–400)
RBC: 3.39 MIL/uL — ABNORMAL LOW (ref 4.22–5.81)
RDW: 14.3 % (ref 11.5–15.5)
WBC: 5.7 10*3/uL (ref 4.0–10.5)
nRBC: 0 % (ref 0.0–0.2)

## 2020-11-25 LAB — T4, FREE: Free T4: 1.45 ng/dL — ABNORMAL HIGH (ref 0.61–1.12)

## 2020-11-25 LAB — TSH: TSH: 0.193 u[IU]/mL — ABNORMAL LOW (ref 0.320–4.118)

## 2020-11-25 MED ORDER — PROCHLORPERAZINE MALEATE 10 MG PO TABS
10.0000 mg | ORAL_TABLET | Freq: Once | ORAL | Status: AC
  Administered 2020-11-25: 10 mg via ORAL
  Filled 2020-11-25: qty 1

## 2020-11-25 MED ORDER — SODIUM CHLORIDE 0.9 % IV SOLN
1000.0000 mg/m2 | Freq: Once | INTRAVENOUS | Status: AC
  Administered 2020-11-25: 2052 mg via INTRAVENOUS
  Filled 2020-11-25: qty 53.97

## 2020-11-25 MED ORDER — OXYCODONE HCL 5 MG PO TABS: 5.0000 mg | ORAL_TABLET | Freq: Four times a day (QID) | ORAL | 0 refills | Status: DC | PRN

## 2020-11-25 MED ORDER — LORAZEPAM 0.5 MG PO TABS: 0.5000 mg | ORAL_TABLET | Freq: Two times a day (BID) | ORAL | 0 refills | Status: DC | PRN

## 2020-11-25 MED ORDER — PACLITAXEL PROTEIN-BOUND CHEMO INJECTION 100 MG
100.0000 mg/m2 | Freq: Once | INTRAVENOUS | Status: AC
  Administered 2020-11-25: 200 mg via INTRAVENOUS
  Filled 2020-11-25: qty 40

## 2020-11-25 MED ORDER — SODIUM CHLORIDE 0.9% FLUSH
10.0000 mL | Freq: Once | INTRAVENOUS | Status: AC
  Administered 2020-11-25: 10 mL

## 2020-11-25 MED ORDER — SODIUM CHLORIDE 0.9 % IV SOLN: Freq: Once | INTRAVENOUS | Status: AC

## 2020-11-25 NOTE — Assessment & Plan Note (Signed)
This is a very pleasant 65 year old male patient with metastatic pancreatic adenocarcinoma status post progression on first-line therapy while he was on 5-FU maintenance now on second line with gem Abraxane who is here for follow-up before cycle 1 day 15 of anticipated treatment.  Since last visit, he has noticed increased pain in the left upper quadrant and has been taking oxycodone once a day.  Otherwise he has noticed some ongoing tingling and numbness which may have gotten worse but overall he thinks it is about the same.  No other complaints today.  Physical examination without any major concerns.  We will plan to proceed with cycle 1 day 15 today.  Anticipate repeat imaging after 2 complete cycles of gem Abraxane.  I have decreased the dose of Abraxane to 100 mics per metered squared given concern for worsening neuropathy.  Patient is aware of this change.

## 2020-11-25 NOTE — Assessment & Plan Note (Signed)
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  He does not want to add any new medication.  Dose reduced Abraxane to 100 mg/m2.  We will continue to monitor the neuropathy.  He understands this could be permanent and disabling.

## 2020-11-25 NOTE — Progress Notes (Signed)
[facility] FOLLOW UP NOTE  Patient Care Team: [name], MD as PCP - General (Hematology and Oncology) Gatha Mayer, MD as Consulting Physician (Gastroenterology) [name], MD as Consulting Physician (Otolaryngology) Elayne Snare, MD as Consulting Physician (Endocrinology) Gaye Pollack, MD as Consulting Physician (Cardiothoracic Surgery) Lucas Mallow, MD as Consulting Physician (Urology) Debara Pickett Nadean Corwin, MD as Consulting Physician (Cardiology) [name], MD as Consulting Physician (Hematology and Oncology) Jonnie Finner, RN (Inactive) as Oncology Nurse Navigator  CHIEF COMPLAINTS/PURPOSE OF CONSULTATION:   Pancreatic cancer, follow up after cycle 1 day 1 of gem Abraxane.  ASSESSMENT & PLAN:   Pancreatic adenocarcinoma [facility]) This is a very pleasant 65 year old male patient with metastatic pancreatic adenocarcinoma status post progression on first-line therapy while he was on 5-FU maintenance now on second line with gem Abraxane who is here for follow-up before cycle 1 day 15 of anticipated treatment.  Since last visit, he has noticed increased pain in the left upper quadrant and has been taking oxycodone once a day.  Otherwise he has noticed some ongoing tingling and numbness which may have gotten worse but overall he thinks it is about the same.  No other complaints today.  Physical examination without any major concerns.  We will plan to proceed with cycle 1 day 15 today.  Anticipate repeat imaging after 2 complete cycles of gem Abraxane.  I have decreased the dose of Abraxane to 100 mics per metered squared given concern for worsening neuropathy.  Patient is aware of this change.  Cancer related pain Oxycodone will be refilled today.  Pain is still moderate about 7 out of 10 in intensity and he has to take oxycodone about once a day.  Anxiety about health He has been taking Ativan as needed for anxiety.   He requested a refill for  prescription of Ativan today.  Neuropathy due to chemotherapeutic drug (HCC) Mild to moderate neuropathy in his fingers.  He does not want to add any new medication.  Dose reduced Abraxane to 100 mg/m2.  We will continue to monitor the neuropathy.  He understands this could be permanent and disabling.  No orders of the defined types were placed in this encounter.    HISTORY OF PRESENTING ILLNESS:   Timothy Page 65 y.o. male is here because of new diagnosis of pancreatic cancer.  Oncology History  Adenocarcinoma of pancreas, stage 4 ([place])  06/20/2020 Initial Diagnosis   Adenocarcinoma of pancreas, stage 4 ([place])   07/01/2020 - 10/21/2020 Chemotherapy          07/01/2020 Genetic Testing   Negative genetic testing on a custom panel reviewing multicancer and pancreatitis genes.  The Custom-Gene Panel offered by [facility] includes sequencing and/or deletion duplication testing of the following 91 genes: AIP, ALK, APC*, ATM*, AXIN2, BAP1, BARD1, BLM, BMPR1A, BRCA1, BRCA2, BRIP1, CASR, CDC73, CDH1, CDK4, CDKN1B, CDKN1C, CDKN2A (p14ARF), CDKN2A (p16INK4a), CEBPA, CFTR*, CHEK2, CPA1, CTNNA1, CTRC, DICER1*, DIS3L2, EGFR, EPCAM*, FANCC, FH*, FLCN, GATA2, GPC3*, GREM1*, HOXB13, HRAS, KIT, MAX*, MEN1*, MET*, MITF*, MLH1*, MSH2*, MSH3*, MSH6*, MUTYH, NBN, NF1*, NF2, NTHL1, PALB2, PALLD, PDGFRA, PHOX2B*, PMS2*, POLD1*, POLE, POT1, PRKAR1A, PRSS1*, PTCH1, PTEN*, RAD50, RAD51C, RAD51D, RB1*, RECQL4*, RET, RUNX1, SDHA*, SDHAF2, SDHB, SDHC*, SDHD, SMAD4, SMARCA4, SMARCB1, SMARCE1, SPINK1, STK11, SUFU, TERC, TERT, TMEM127, TP53, TSC1*, TSC2, VHL, WRN*, and WT1.  The report date is Jul 01, 2020.    Imaging   Imaging after 4 cycles of therapy  IMPRESSION: 1. Pancreatic neck mass is  stable. 2. No new or progressive metastatic disease. Tiny soft tissue nodule along the posteroinferior right liver capsule is stable. Solitary liver metastasis adjacent to the gallbladder is mildly decreased. Small bilateral  pulmonary nodules are all stable. 3. New small nonocclusive thrombosis in the main portal vein. Persistent near occlusion of the proximal main portal vein with narrowing of the portal splenic venous confluence. 4. Aortic Atherosclerosis (ICD10-I70.0).   11/10/2020 -  Chemotherapy   Patient is on Treatment Plan : PANCREATIC Abraxane / Gemcitabine D1,8,15 q28d      He got one cycle of FOLFIRINOX on 07/01/2020 Following this, we changed the regimen to FOLFOX given toxicity. FOLFOX continued from 07/23/2020 through 10/06/2020 Oxali discontinued given neuropathy Started 5 FU on 10/21/2020 Imaging with two new pulmonary nodules, suspicious for progressive metastatic disease. New hypoattenuating lesion within the posterior right hepatic lobe, favoring metastatic disease Progressed on 1 st line chemotherapy ( He was only on 5 FU alone when he was noted to have progression) CT chest abdomen pelvis with contrast on 11/02/2020 showed 2 new pulmonary nodules suspicious for progressive metastatic disease, new hypoattenuating lesion within the posterior right hepatic lobe favoring metastatic disease.   Given areas of presumed focal steatosis adjacent to the gallbladder rounded focal steatosis is possible but felt less likely. He was then switched to second line gem Abraxane and is here for cycle 1 day 15 of gem Abraxane.  Since his last visit, he has noticed more pain in the left upper quadrant, its about 6-8/10 in intensity. He has been taking oxycodone about once a day.  He has noticed more pain since last visit. No nausea or vomiting, appetite is fair Ongoing tingling and numbness, may have gotten worse since last visit, dropping pencils. No falls.  No gait changes.  Overall he states the neuropathy is not much worse. No diarrhea. Rest of the pertinent 10 point ROS reviewed and negative.  MEDICAL HISTORY:  Past Medical History:  Diagnosis Date   Arthritis    Atypical chest pain 04/2017   ACS ruled out  05/11/17   CAP (community acquired pneumonia) 05/07/2017   Chronic constipation    at least partially d/t chronic opioids   Diabetes mellitus without complication ([place])    Poor control starting fall 2021-->GAD ab neg.  Insulin and C peptide levels wnl but on lower end of normal->Dr Kuma/endo 2022.   Exocrine pancreatic insufficiency 2022   History of thyroidectomy, total 2018   Hurlthe cell adenoma   Hx of adenomatous polyp of colon 07/02/2009   rpt colonoscopy 2026   Hyperlipidemia    Intol of multiple statins and zetia. Advanced lipid clinic 2022.   Hypertension    Lipoma    back   Myocardial infarction [facility])    MI at age 57   Pancreatic adenocarcinoma ([place]) 06/2020   Mets to liver (?and lungs?->stage 4), w/acute pancreatitis.   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 Cammy Copa   Right ureteral calculus 04/2018   Cystoscopy with right retrograde pyelogram and right ureteral stent placement after stone removal.   Seasonal allergies    Stroke ([place])    in 37D no complications    Tachyarrhythmia 1980   age 54 X 2 over 34 month period.  No recurrence since that time.  Thoracic aortic aneurysm (HCC)    Incidentally discovered, 4.1 cm-->Dr. Cyndia Bent. Rpt CT 08/2018 and 08/2019 stable.   Vocal cord paralysis 07/2016   Left vocal cord paralysis noted after thyroid surgery.    SURGICAL HISTORY: Past Surgical History:  Procedure Laterality Date   BIOPSY  06/18/2020   Procedure: BIOPSY;  Surgeon: Mansouraty, Telford Nab., MD;  Location: [facility] ENDOSCOPY;  Service: Gastroenterology;;   [place] W/ POLYPECTOMY  07/02/2009; 01/24/20   2011 adenoma.  2021 adenomas x 3->recall 2026   CYSTOSCOPY WITH RETROGRADE PYELOGRAM, URETEROSCOPY AND STENT  PLACEMENT Right 05/09/2018   Procedure: CYSTOSCOPY WITH RIGHT RETROGRADE RIGHT URETEROSCOPY STONE EXTRACTION RIGHT STENT EXCHANGE;  Surgeon: Lucas Mallow, MD;  Location: [facility] ORS;  Service: Urology;  Laterality: Right;   CYSTOSCOPY/RETROGRADE/URETEROSCOPY/STONE EXTRACTION WITH BASKET Right 04/30/2018   Procedure: CYSTOSCOPY/RETROGRADE/RIGHT URETEROSCOPY/;  Surgeon: Lucas Mallow, MD;  Location: [facility] ORS;  Service: Urology;  Laterality: Right;   ESOPHAGOGASTRODUODENOSCOPY  07/02/2009   NORMAL   ESOPHAGOGASTRODUODENOSCOPY (EGD) WITH PROPOFOL N/A 06/18/2020   Procedure: ESOPHAGOGASTRODUODENOSCOPY (EGD) WITH PROPOFOL;  Surgeon: Rush Landmark Telford Nab., MD;  Location: [place];  Service: Gastroenterology;  Laterality: N/A;   EYE SURGERY     FINE NEEDLE ASPIRATION  06/18/2020   Procedure: FINE NEEDLE ASPIRATION (FNA) LINEAR;  Surgeon: Irving Copas., MD;  Location: [facility];  Service: Gastroenterology;;   finger amputaion     with reattachement   INCISION AND DRAINAGE ABSCESS ANAL  11/23/2019   gen surg (In ED).   IR IMAGING GUIDED PORT INSERTION  06/22/2020   LASIK     THYROIDECTOMY N/A 08/03/2016   Procedure: Total THYROIDECTOMY;  Surgeon: [name], MD;  Location: [facility];  Service: ENT;  Laterality: N/A;  Total Thyroidectomy    UPPER ESOPHAGEAL ENDOSCOPIC ULTRASOUND (EUS) N/A 06/18/2020   Procedure: UPPER ESOPHAGEAL ENDOSCOPIC ULTRASOUND (EUS);  Surgeon: Irving Copas., MD;  Location: [employer];  Service: Gastroenterology;  Laterality: N/A;   UPPER GASTROINTESTINAL ENDOSCOPY     with EUS and bx of pancreatic mass. +appearance of candida in esoph.      SOCIAL HISTORY: Social History   Socioeconomic History   Marital status: Married    Spouse name: Not on file   Number of children: Not on file   Years of education: Not on file   Highest education level: Not on file  Occupational History   Not on file  Tobacco Use   Smoking status: Never   Smokeless tobacco:  Current    Types: Snuff  Vaping Use   Vaping Use: Never used  Substance and Sexual Activity   Alcohol use: No   Drug use: No   Sexual activity: Not on file  Other Topics Concern   Not on file  Social History Narrative   Married, 2 daughters, 1 grandson that lives with him.   Educ: some college   Occup: retired from the [employer].  Retired at 21 but returned to work at some point, now with grandson living with him.   No T/A/Ds.   Social Determinants of Health   Financial Resource Strain: Not on file  Food Insecurity: Not on file  Transportation Needs: Not on file  Physical Activity: Not on file  Stress: Not on file  Social Connections: Not on file  Intimate Partner Violence: Not on file    FAMILY HISTORY: Family History  Problem Relation Age of Onset   Diabetes Mother    Cancer Mother  lung   COPD Mother    Brain cancer Mother    Arthritis Mother    Hyperlipidemia Mother    Heart disease Mother    Hypertension Mother    Diabetes Father    Cancer Father        mesothelioma; skin   Mental illness Father    Arthritis Father    Hyperlipidemia Father    Heart disease Father    Hypertension Father    Heart disease Maternal Grandmother        MI @ 87   Heart disease Maternal Grandfather        MI @ 53   Diabetes Paternal Grandmother    Dementia Paternal Grandmother    Heart disease Paternal Grandfather        MI @ 5   Colon cancer Neg Hx    Esophageal cancer Neg Hx    Rectal cancer Neg Hx    Stomach cancer Neg Hx     ALLERGIES:  is allergic to atenolol, hydroxyzine, tetracycline, atorvastatin, codeine, guaifenesin, and mucinex [guaifenesin er].  MEDICATIONS:  Current Outpatient Medications  Medication Sig Dispense Refill   Continuous Blood Gluc Sensor (FREESTYLE LIBRE 2 SENSOR) MISC APPLY 1 SENSOR TO THE SKIN EVERY 14 DAYS AS DIRECTED 2 each 3   docusate sodium (COLACE) 100 MG capsule Take 100 mg by mouth 2 (two) times daily.     ELIQUIS 5 MG  TABS tablet TAKE 1 TABLET(5 MG) BY MOUTH TWICE DAILY 60 tablet 1   HUMALOG KWIKPEN 100 UNIT/ML KwikPen Inject 20-22 Units into the skin daily.     insulin glargine (LANTUS SOLOSTAR) 100 UNIT/ML Solostar Pen Give up to 10 units under the skin every evening at bedtime. 15 mL 1   Insulin Pen Needle 31G X 5 MM MISC Use with insulin pen 3 times a day 100 each 1   lactulose (CHRONULAC) 10 GM/15ML solution Take 15 mLs (10 g total) by mouth 3 (three) times daily as needed for mild constipation. 236 mL 0   levothyroxine (SYNTHROID) 137 MCG tablet TAKE 1 TABLET BY MOUTH  DAILY BEFORE BREAKFAST 90 tablet 3   lipase/protease/amylase (CREON) 36000 UNITS CPEP capsule Take 2 capsules (72,000 Units total) by mouth 3 (three) times daily with meals. May also take 1 capsule (36,000 Units total) as needed (with snacks). 240 capsule 11   LORazepam (ATIVAN) 0.5 MG tablet Take 1 tablet (0.5 mg total) by mouth every 12 (twelve) hours as needed for anxiety. 30 tablet 0   magic mouthwash SOLN SWISH WITH 30 MLS BY MOUTH FOUR TIMES DAILY AND SWALLOW. NO FOOD OR DRINK FOR 1 HOUR AFTERWARDS     magnesium hydroxide (MILK OF MAGNESIA) 400 MG/5ML suspension Take by mouth daily as needed for mild constipation.     omeprazole (PRILOSEC) 20 MG capsule TAKE 1 CAPSULE(20 MG) BY MOUTH DAILY 30 capsule 0   polyethylene glycol (MIRALAX / GLYCOLAX) 17 g packet Take 17 g by mouth daily.     prochlorperazine (COMPAZINE) 10 MG tablet TAKE 1 TABLET(10 MG) BY MOUTH EVERY 6 HOURS AS NEEDED FOR NAUSEA OR VOMITING 30 tablet 1   ramipril (ALTACE) 10 MG capsule Take 10 mg by mouth 2 (two) times daily.     senna-docusate (SENOKOT-S) 8.6-50 MG tablet Take 1 tablet by mouth 2 (two) times daily.     oxyCODONE (OXY IR/ROXICODONE) 5 MG immediate release tablet Take 1 tablet (5 mg total) by mouth every 6 (six) hours as needed for severe pain or  moderate pain. 60 tablet 0   No current facility-administered medications for this visit.    Facility-Administered Medications Ordered in Other Visits  Medication Dose Route Frequency Provider Last Rate Last Admin   gemcitabine (GEMZAR) 2,052 mg in sodium chloride 0.9 % 250 mL chemo infusion  1,000 mg/m2 (Treatment Plan Recorded) Intravenous Once [name], MD 608 mL/hr at 11/25/20 1452 2,052 mg at 11/25/20 1452   LORazepam (ATIVAN) tablet 0.5 mg  0.5 mg Oral Once Truitt Merle, MD        PHYSICAL EXAMINATION: ECOG PERFORMANCE STATUS: 1 - Symptomatic but completely ambulatory   Physical Exam Constitutional:      Appearance: Normal appearance.  Cardiovascular:     Rate and Rhythm: Normal rate and regular rhythm.     Pulses: Normal pulses.     Heart sounds: Normal heart sounds.  Pulmonary:     Effort: Pulmonary effort is normal.     Breath sounds: Normal breath sounds.  Abdominal:     General: Abdomen is flat. Bowel sounds are normal.  Musculoskeletal:        General: No swelling or tenderness.     Cervical back: Normal range of motion and neck supple. No rigidity.  Lymphadenopathy:     Cervical: No cervical adenopathy.  Neurological:     Mental Status: He is alert.     LABORATORY DATA:  I have reviewed the data as listed Lab Results  Component Value Date   WBC 5.7 11/25/2020   HGB 11.5 (L) 11/25/2020   HCT 32.9 (L) 11/25/2020   MCV 97.1 11/25/2020   PLT 176 11/25/2020     Chemistry      Component Value Date/Time   NA 141 11/25/2020 1201   K 3.4 (L) 11/25/2020 1201   CL 109 11/25/2020 1201   CO2 26 11/25/2020 1201   BUN 10 11/25/2020 1201   CREATININE 0.81 11/25/2020 1201   CREATININE 1.11 11/03/2017 1533      Component Value Date/Time   CALCIUM 8.9 11/25/2020 1201   ALKPHOS 80 11/25/2020 1201   AST 15 11/25/2020 1201   ALT 11 11/25/2020 1201   BILITOT 0.8 11/25/2020 1201      RADIOGRAPHIC STUDIES:  CT CHEST ABDOMEN PELVIS W CONTRAST  Result Date: 11/02/2020 CLINICAL DATA:  pancreatic cancer diagnosed in June. Chemotherapy in progress.  Occasional abdominal pain and constipation. No chest complaints. EXAM: CT CHEST, ABDOMEN, AND PELVIS WITH CONTRAST TECHNIQUE: Multidetector CT imaging of the chest, abdomen and pelvis was performed following the standard protocol during bolus administration of intravenous contrast. CONTRAST:  55m OMNIPAQUE IOHEXOL 350 MG/ML SOLN COMPARISON:  08/26/2020 FINDINGS: CT CHEST FINDINGS Cardiovascular: Right Port-A-Cath tip high right atrium. Thyroidectomy. Aortic atherosclerosis. Tortuous thoracic aorta. Normal heart size, without pericardial effusion. Lad and right coronary artery calcification. No central pulmonary embolism, on this non-dedicated study. Mediastinum/Nodes: No supraclavicular adenopathy. No mediastinal or hilar adenopathy. Lungs/Pleura: No pleural fluid. Posterior right upper lobe irregular pulmonary nodule or area of nodularity including at 9 mm on 72/12, new. Right middle lobe subpleural pulmonary nodule of 5 mm on 109/12, new. Other pulmonary nodules, including within the left lower lobe at up to 5 mm x 2 on 88/12 are unchanged. Musculoskeletal: No acute osseous abnormality. CT ABDOMEN PELVIS FINDINGS Hepatobiliary: Vague area of arterial phase hyperenhancement within segment 4 B including on 36/2 is similar to on the prior. Development of a right hepatic lobe hypoattenuating 3.1 x 2.5 cm lesion on 168/11. The previously described pericholecystic liver lesion is not  readily apparent today. There are new areas of pericholecystic hypoattenuation including more posteriorly and laterally on 157/11, favored to represent focal steatosis. Normal gallbladder, without biliary ductal dilatation. Pancreas: Pancreatic duct dilatation and atrophy in the body and tail again identified. Example duct measurement at up to 9 mm today versus 8 mm on the prior. Continues to the level of an area of soft tissue fullness within the pancreatic head and neck. Somewhat difficult to differentiate from surrounding parenchyma,  but felt to measure on the order of 2.8 x 2.5 cm on 160/11. Compare 3.7 x 2.8 cm on the prior exam. No superimposed acute pancreatitis. Spleen: Normal in size, without focal abnormality. Adrenals/Urinary Tract: Normal adrenal glands. Normal kidneys, without hydronephrosis. Normal urinary bladder. Stomach/Bowel: Proximal gastric underdistention. Moderate amount of stool within the rectum. Normal appendix and terminal ileum. Normal small bowel. Vascular/Lymphatic: Aortic atherosclerosis. Again identified is narrowing of the splenoportal confluence with resultant enlargement of gastroepiploic collaterals. The previously described nonocclusive main portal vein thrombus/filling defect is not confidently identified today. There is equivocal hypoattenuation in this area including on 154/11. Patent hepatic veins, SMV. No abdominopelvic adenopathy. Reproductive: Normal prostate. Other: No significant free fluid. No evidence of omental or peritoneal disease. Musculoskeletal: Degenerative partial fusion of both sacroiliac joints. IMPRESSION: 1. Two new pulmonary nodules, suspicious for progressive metastatic disease. Other pulmonary nodules are similar. Given somewhat ill-defined morphology of the posterior right upper lobe new nodule, recommend clinical exclusion of mild infection, felt less likely. 2. New hypoattenuating lesion within the posterior right hepatic lobe, favoring metastatic disease. Given areas of presumed focal steatosis adjacent the gallbladder, rounded focal steatosis is possible but felt less likely. If imaged differentiation is desired, pre and post-contrast MRI or PET would likely be definitive. 3. The pancreatic primary is slightly decreased and less well-defined today. 4. Otherwise, no new sites of metastatic disease. 5.  Possible constipation. 6. Coronary artery atherosclerosis. Aortic Atherosclerosis (ICD10-I70.0). Electronically Signed   By: Abigail Miyamoto M.D.   On: 11/02/2020 11:46    CMP  satisfactory to proceed CBC normal. TSH slightly low.  All questions were answered. The patient knows to call the clinic with any problems, questions or concerns.  I spent 30 minutes in the care of this patient including history and physical, review of records, counseling and coordination of care.     [name], MD 11/25/2020 2:58 PM

## 2020-11-25 NOTE — Assessment & Plan Note (Signed)
Oxycodone will be refilled today.  Pain is still moderate about 7 out of 10 in intensity and he has to take oxycodone about once a day.

## 2020-11-25 NOTE — Assessment & Plan Note (Signed)
He has been taking Ativan as needed for anxiety.   He requested a refill for prescription of Ativan today.

## 2020-12-01 ENCOUNTER — Other Ambulatory Visit: Payer: Self-pay | Admitting: *Deleted

## 2020-12-01 DIAGNOSIS — C259 Malignant neoplasm of pancreas, unspecified: Secondary | ICD-10-CM

## 2020-12-01 MED ORDER — PROCHLORPERAZINE MALEATE 10 MG PO TABS
ORAL_TABLET | ORAL | 1 refills | Status: DC
Start: 2020-12-01 — End: 2021-01-05

## 2020-12-08 ENCOUNTER — Encounter: Payer: Self-pay | Admitting: Hematology

## 2020-12-08 ENCOUNTER — Inpatient Hospital Stay: Payer: 59

## 2020-12-08 ENCOUNTER — Other Ambulatory Visit: Payer: Self-pay

## 2020-12-08 ENCOUNTER — Inpatient Hospital Stay (HOSPITAL_BASED_OUTPATIENT_CLINIC_OR_DEPARTMENT_OTHER): Payer: 59 | Admitting: Hematology

## 2020-12-08 VITALS — BP 174/94 | HR 70 | Temp 98.4°F | Resp 18 | Ht 69.0 in | Wt 199.7 lb

## 2020-12-08 DIAGNOSIS — C259 Malignant neoplasm of pancreas, unspecified: Secondary | ICD-10-CM

## 2020-12-08 DIAGNOSIS — Z95828 Presence of other vascular implants and grafts: Secondary | ICD-10-CM

## 2020-12-08 DIAGNOSIS — Z5111 Encounter for antineoplastic chemotherapy: Secondary | ICD-10-CM | POA: Diagnosis not present

## 2020-12-08 LAB — CBC WITH DIFFERENTIAL/PLATELET
Abs Immature Granulocytes: 0.01 10*3/uL (ref 0.00–0.07)
Basophils Absolute: 0 10*3/uL (ref 0.0–0.1)
Basophils Relative: 1 %
Eosinophils Absolute: 0 10*3/uL (ref 0.0–0.5)
Eosinophils Relative: 1 %
HCT: 31.7 % — ABNORMAL LOW (ref 39.0–52.0)
Hemoglobin: 10.9 g/dL — ABNORMAL LOW (ref 13.0–17.0)
Immature Granulocytes: 0 %
Lymphocytes Relative: 26 %
Lymphs Abs: 1 10*3/uL (ref 0.7–4.0)
MCH: 33.4 pg (ref 26.0–34.0)
MCHC: 34.4 g/dL (ref 30.0–36.0)
MCV: 97.2 fL (ref 80.0–100.0)
Monocytes Absolute: 0.5 10*3/uL (ref 0.1–1.0)
Monocytes Relative: 13 %
Neutro Abs: 2.3 10*3/uL (ref 1.7–7.7)
Neutrophils Relative %: 59 %
Platelets: 113 10*3/uL — ABNORMAL LOW (ref 150–400)
RBC: 3.26 MIL/uL — ABNORMAL LOW (ref 4.22–5.81)
RDW: 13.9 % (ref 11.5–15.5)
WBC: 3.8 10*3/uL — ABNORMAL LOW (ref 4.0–10.5)
nRBC: 0 % (ref 0.0–0.2)

## 2020-12-08 LAB — CMP (CANCER CENTER ONLY)
ALT: 8 U/L (ref 0–44)
AST: 12 U/L — ABNORMAL LOW (ref 15–41)
Albumin: 3.7 g/dL (ref 3.5–5.0)
Alkaline Phosphatase: 82 U/L (ref 38–126)
Anion gap: 10 (ref 5–15)
BUN: 12 mg/dL (ref 8–23)
CO2: 21 mmol/L — ABNORMAL LOW (ref 22–32)
Calcium: 8.8 mg/dL — ABNORMAL LOW (ref 8.9–10.3)
Chloride: 109 mmol/L (ref 98–111)
Creatinine: 0.96 mg/dL (ref 0.61–1.24)
GFR, Estimated: >60 mL/min (ref >60)
Glucose, Bld: 267 mg/dL — ABNORMAL HIGH (ref 70–99)
Potassium: 3.9 mmol/L (ref 3.5–5.1)
Sodium: 140 mmol/L (ref 135–145)
Total Bilirubin: 0.5 mg/dL (ref 0.3–1.2)
Total Protein: 6.4 g/dL — ABNORMAL LOW (ref 6.5–8.1)

## 2020-12-08 MED ORDER — SODIUM CHLORIDE 0.9% FLUSH
10.0000 mL | Freq: Once | INTRAVENOUS | Status: AC
  Administered 2020-12-08: 10 mL

## 2020-12-08 MED ORDER — LORAZEPAM 0.5 MG PO TABS: 0.5000 mg | ORAL_TABLET | Freq: Two times a day (BID) | ORAL | 0 refills | Status: DC | PRN

## 2020-12-08 MED ORDER — SODIUM CHLORIDE 0.9 % IV SOLN
1000.0000 mg/m2 | Freq: Once | INTRAVENOUS | Status: AC
  Administered 2020-12-08: 2052 mg via INTRAVENOUS
  Filled 2020-12-08: qty 53.97

## 2020-12-08 MED ORDER — PROCHLORPERAZINE MALEATE 10 MG PO TABS
10.0000 mg | ORAL_TABLET | Freq: Once | ORAL | Status: AC
  Administered 2020-12-08: 10 mg via ORAL
  Filled 2020-12-08: qty 1

## 2020-12-08 MED ORDER — GABAPENTIN 100 MG PO CAPS: 100.0000 mg | ORAL_CAPSULE | Freq: Every day | ORAL | 0 refills | Status: DC

## 2020-12-08 MED ORDER — SODIUM CHLORIDE 0.9 % IV SOLN: Freq: Once | INTRAVENOUS | Status: DC

## 2020-12-08 MED ORDER — PACLITAXEL PROTEIN-BOUND CHEMO INJECTION 100 MG
50.0000 mg/m2 | Freq: Once | INTRAVENOUS | Status: AC
  Administered 2020-12-08: 100 mg via INTRAVENOUS
  Filled 2020-12-08: qty 20

## 2020-12-08 NOTE — Progress Notes (Signed)
[facility]   [phone] Fax:([phone]) [phone]   Clinic Follow up Note   Patient Care Team: [name], MD as PCP - General (Hematology and Oncology) Gatha Mayer, MD as Consulting Physician (Gastroenterology) [name], MD as Consulting Physician (Otolaryngology) Elayne Snare, MD as Consulting Physician (Endocrinology) Gaye Pollack, MD as Consulting Physician (Cardiothoracic Surgery) Lucas Mallow, MD as Consulting Physician (Urology) Pixie Casino, MD as Consulting Physician (Cardiology) [name], MD as Consulting Physician (Hematology and Oncology) Jonnie Finner, RN (Inactive) as Oncology Nurse Navigator 12/08/2020  CHIEF COMPLAINT: f/u pancreatic cancer   ASSESSMENT & PLAN: 65 yo male   Pancreatic adenocarcinoma ([place]), 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  I refilled today.    Neuropathy due to chemotherapeutic drug ([place]) -Secondary to oxaliplatin and Abraxane -Much worse lately, will decrease Abraxane dose further to 50 mg/m, may need to stop in the near future -He is also diabetic, no neuropathy before chemo -I recommend him to try gabapentin  5.  Poorly controlled diabetes, hypothyroidism -He is on insulin, blood glucose has been fluctuating significantly, I encouraged him to follow-up with his endocrinologist  Plan -Due to worsening neuropathy, will reduce Abraxane to 50 mg/m -Lab reviewed, adequate for treatment, will proceed cycle 2-day 1 gemcitabine and Abraxane -I called in gabapentin 100 mg at night, will increase dose  gradually as tolerated -He will return in 2 weeks for next cycle of chemo, and see NP before treatment  SUMMARY OF ONCOLOGIC HISTORY: Oncology History  Adenocarcinoma of pancreas, stage 4 ([place])  06/20/2020 Initial Diagnosis   Adenocarcinoma of pancreas, stage 4 ([place])   07/01/2020 - 10/21/2020 Chemotherapy          07/01/2020 Genetic Testing   Negative genetic testing on a custom panel reviewing multicancer and pancreatitis genes.  The Custom-Gene Panel offered by [facility] includes sequencing and/or deletion duplication testing of the following 91 genes: AIP, ALK, APC*, ATM*, AXIN2, BAP1, BARD1, BLM, BMPR1A, BRCA1, BRCA2, BRIP1, CASR, CDC73, CDH1, CDK4, CDKN1B, CDKN1C, CDKN2A (p14ARF), CDKN2A (p16INK4a), CEBPA, CFTR*, CHEK2, CPA1, CTNNA1, CTRC, DICER1*, DIS3L2, EGFR, EPCAM*, FANCC, FH*, FLCN, GATA2, GPC3*, GREM1*, HOXB13, HRAS, KIT, MAX*, MEN1*, MET*, MITF*, MLH1*, MSH2*, MSH3*, MSH6*, MUTYH, NBN, NF1*, NF2, NTHL1, PALB2, PALLD, PDGFRA, PHOX2B*, PMS2*, POLD1*, POLE, POT1, PRKAR1A, PRSS1*, PTCH1, PTEN*, RAD50, RAD51C, RAD51D, RB1*, RECQL4*, RET, RUNX1, SDHA*, SDHAF2, SDHB, SDHC*, SDHD, SMAD4, SMARCA4, SMARCB1, SMARCE1, SPINK1, STK11, SUFU, TERC, TERT, TMEM127, TP53, TSC1*, TSC2, VHL, WRN*, and WT1.  The report date is Jul 01, 2020.    Imaging   Imaging after 4 cycles of therapy  IMPRESSION: 1. Pancreatic neck mass is stable. 2. No new or progressive metastatic disease. Tiny soft tissue nodule along the posteroinferior right liver capsule is stable. Solitary liver metastasis adjacent to the gallbladder is mildly decreased. Small bilateral pulmonary nodules are all stable. 3. New small nonocclusive thrombosis in the main portal vein. Persistent near occlusion of the proximal main portal vein with narrowing of the portal splenic venous confluence. 4. Aortic Atherosclerosis (ICD10-I70.0).   11/10/2020 -  Chemotherapy   Patient is  on Treatment Plan : PANCREATIC Abraxane / Gemcitabine D1,8,15 q28d        CURRENT THERAPY: Second line gemcitabine and Abraxane every 2 weeks  INTERVAL HISTORY: Mr. Conant returns for follow-up and chemotherapy.  I am seeing him during the absence of his primary oncologist Dr. Chryl Heck.  He is tolerating chemotherapy well, but the neuropathy in hands and feet has getting much worse, he has dropped things frequently, he has mild balance issue, no recent fall.  He feels tingling in his hands and feet which has interfered with his sleep at night.  He still works as a Hotel manager.  He otherwise has been tolerating chemotherapy well.  Weight is stable.  He does have taste change, but overall eating well.  All other systems were reviewed with the patient and are negative.  MEDICAL HISTORY:  Past Medical History:  Diagnosis Date   Arthritis    Atypical chest pain 04/2017   ACS ruled out 05/11/17   CAP (community acquired pneumonia) 05/07/2017   Chronic constipation    at least partially d/t chronic opioids   Diabetes mellitus without complication ([place])    Poor control starting fall 2021-->GAD ab neg.  Insulin and C peptide levels wnl but on lower end of normal->Dr Kuma/endo 2022.   Exocrine pancreatic insufficiency 2022   History of thyroidectomy, total 2018   [place]url[place] cell adenoma   Hx of adenomatous polyp of colon 07/02/2009   rpt colonoscopy 2026   Hyperlipidemia    Intol of multiple statins and zetia. [facility] lipid clinic 2022.   Hypertension    Lipoma    back   Myocardial infarction [facility])    MI at age 53   Pancreatic adenocarcinoma ([place]) 06/2020   Mets to liver (?and lungs?->stage 4), w/acute pancreatitis.   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 Cammy Copa   Right ureteral calculus 04/2018   Cystoscopy with right retrograde pyelogram  and right ureteral stent placement after stone removal.   Seasonal allergies    Stroke ([place])    in 41L no complications    Tachyarrhythmia 1980   age 75 X 2 over 29 month period.  No recurrence since that time.   Thoracic aortic aneurysm    Incidentally discovered, 4.1 cm-->Dr. Cyndia Bent. Rpt CT 08/2018 and 08/2019 stable.   Vocal cord paralysis 07/2016   Left vocal cord paralysis noted after thyroid surgery.    SURGICAL HISTORY: Past Surgical History:  Procedure Laterality Date   BIOPSY  06/18/2020   Procedure: BIOPSY;  Surgeon: Mansouraty, Telford Nab., MD;  Location: [facility] ENDOSCOPY;  Service: Gastroenterology;;   [place] W/ POLYPECTOMY  07/02/2009; 01/24/20   2011 adenoma.  2021 adenomas x 3->recall 2026   CYSTOSCOPY WITH RETROGRADE PYELOGRAM, URETEROSCOPY AND STENT PLACEMENT Right 05/09/2018   Procedure: CYSTOSCOPY WITH RIGHT RETROGRADE RIGHT URETEROSCOPY STONE EXTRACTION RIGHT STENT EXCHANGE;  Surgeon: Lucas Mallow, MD;  Location: [facility] ORS;  Service: Urology;  Laterality: Right;   CYSTOSCOPY/RETROGRADE/URETEROSCOPY/STONE EXTRACTION WITH BASKET Right 04/30/2018   Procedure: CYSTOSCOPY/RETROGRADE/RIGHT URETEROSCOPY/;  Surgeon: Lucas Mallow, MD;  Location: [facility] ORS;  Service: Urology;  Laterality: Right;   ESOPHAGOGASTRODUODENOSCOPY  07/02/2009   NORMAL   ESOPHAGOGASTRODUODENOSCOPY (EGD) WITH PROPOFOL N/A 06/18/2020   Procedure: ESOPHAGOGASTRODUODENOSCOPY (EGD) WITH PROPOFOL;  Surgeon: Irving Copas., MD;  Location: [facility];  Service: Gastroenterology;  Laterality: N/A;   EYE SURGERY     FINE NEEDLE ASPIRATION  06/18/2020   Procedure: FINE NEEDLE ASPIRATION (FNA) LINEAR;  Surgeon: Irving Copas., MD;  Location: [place];  Service: Gastroenterology;;   finger amputaion     with reattachement   INCISION AND DRAINAGE ABSCESS ANAL  11/23/2019   gen surg (In ED).   IR IMAGING GUIDED PORT INSERTION  06/22/2020   LASIK      THYROIDECTOMY N/A 08/03/2016   Procedure: Total THYROIDECTOMY;  Surgeon: [name], MD;  Location: [place];  Service: ENT;  Laterality: N/A;  Total Thyroidectomy    UPPER ESOPHAGEAL ENDOSCOPIC ULTRASOUND (EUS) N/A 06/18/2020   Procedure: UPPER ESOPHAGEAL ENDOSCOPIC ULTRASOUND (EUS);  Surgeon: Irving Copas., MD;  Location: [facility];  Service: Gastroenterology;  Laterality: N/A;   UPPER GASTROINTESTINAL ENDOSCOPY     with EUS and bx of pancreatic mass. +appearance of candida in esoph.      I have reviewed the social history and family history with the patient and they are unchanged from previous note.  ALLERGIES:  is allergic to atenolol, hydroxyzine, tetracycline, atorvastatin, codeine, guaifenesin, and mucinex [guaifenesin er].  MEDICATIONS:  Current Outpatient Medications  Medication Sig Dispense Refill   gabapentin (NEURONTIN) 100 MG capsule Take 1 capsule (100 mg total) by mouth at bedtime. Ok to increase to 275m at bedtime in second week and 3049mat bed time on third week if needed 90 capsule 0   Continuous Blood Gluc Sensor (FREESTYLE LIBRE 2 SENSOR) MISC APPLY 1 SENSOR TO THE SKIN EVERY 14 DAYS AS DIRECTED 2 each 3   docusate sodium (COLACE) 100 MG capsule Take 100 mg by mouth 2 (two) times daily.     ELIQUIS 5 MG TABS tablet TAKE 1 TABLET(5 MG) BY MOUTH TWICE DAILY 60 tablet 1   HUMALOG KWIKPEN 100 UNIT/ML KwikPen Inject 20-22 Units into the skin daily.     insulin glargine (LANTUS SOLOSTAR) 100 UNIT/ML Solostar Pen Give up to 10 units under the skin every evening at bedtime. 15 mL 1   Insulin Pen Needle 31G X 5 MM MISC Use with insulin pen 3 times a day 100 each 1   lactulose (CHRONULAC) 10 GM/15ML solution Take 15 mLs (10 g total) by mouth 3 (three) times daily as needed for mild constipation. 236 mL 0   levothyroxine (SYNTHROID) 137 MCG tablet TAKE 1 TABLET BY MOUTH  DAILY BEFORE BREAKFAST 90 tablet 3   lipase/protease/amylase (CREON) 36000 UNITS CPEP capsule Take 2  capsules (72,000 Units total) by mouth 3 (three) times daily with meals. May also take 1 capsule (36,000 Units total) as needed (with snacks). 240 capsule 11   LORazepam (ATIVAN) 0.5 MG tablet Take 1 tablet (0.5 mg total) by mouth every 12 (twelve) hours as needed for anxiety. 60 tablet 0   magic mouthwash SOLN SWISH WITH 30 MLS BY MOUTH FOUR TIMES DAILY AND SWALLOW. NO FOOD OR DRINK FOR 1 HOUR AFTERWARDS     magnesium hydroxide (MILK OF MAGNESIA) 400 MG/5ML suspension Take by mouth daily as needed for mild constipation.     omeprazole (PRILOSEC) 20 MG capsule TAKE 1 CAPSULE(20 MG) BY MOUTH DAILY 30 capsule 0   oxyCODONE (OXY IR/ROXICODONE) 5 MG immediate release tablet Take 1 tablet (5 mg total) by mouth every 6 (six) hours as needed for severe pain or moderate pain. 60 tablet 0   polyethylene glycol (MIRALAX /  GLYCOLAX) 17 g packet Take 17 g by mouth daily.     prochlorperazine (COMPAZINE) 10 MG tablet TAKE 1 TABLET(10 MG) BY MOUTH EVERY 6 HOURS AS NEEDED FOR NAUSEA OR VOMITING 30 tablet 1   ramipril (ALTACE) 10 MG capsule Take 10 mg by mouth 2 (two) times daily.     senna-docusate (SENOKOT-S) 8.6-50 MG tablet Take 1 tablet by mouth 2 (two) times daily.     No current facility-administered medications for this visit.   Facility-Administered Medications Ordered in Other Visits  Medication Dose Route Frequency Provider Last Rate Last Admin   0.9 %  sodium chloride infusion   Intravenous Once Truitt Merle, MD       gemcitabine (GEMZAR) 2,052 mg in sodium chloride 0.9 % 250 mL chemo infusion  1,000 mg/m2 (Treatment Plan Recorded) Intravenous Once Truitt Merle, MD       LORazepam (ATIVAN) tablet 0.5 mg  0.5 mg Oral Once Truitt Merle, MD       PACLitaxel-protein bound (ABRAXANE) chemo infusion 100 mg  50 mg/m2 (Treatment Plan Recorded) Intravenous Once Truitt Merle, MD       prochlorperazine (COMPAZINE) tablet 10 mg  10 mg Oral Once Truitt Merle, MD        PHYSICAL EXAMINATION: ECOG PERFORMANCE STATUS: 1 -  Symptomatic but completely ambulatory  Vitals:   12/08/20 1306  BP: (!) 174/94  Pulse: 70  Resp: 18  Temp: 98.4 F (36.9 C)  SpO2: 100%   Filed Weights   12/08/20 1306  Weight: 199 lb 11.2 oz (90.6 kg)    GENERAL:alert, no distress and comfortable SKIN: skin color, texture, turgor are normal, no rashes or significant lesions Musculoskeletal:no cyanosis of digits and no clubbing  NEURO: alert & oriented x 3 with fluent speech  LABORATORY DATA:  I have reviewed the data as listed CBC Latest Ref Rng & Units 12/08/2020 11/25/2020 11/10/2020  WBC 4.0 - 10.5 K/uL 3.8(L) 5.7 5.1  Hemoglobin 13.0 - 17.0 g/dL 10.9(L) 11.5(L) 12.4(L)  Hematocrit 39.0 - 52.0 % 31.7(L) 32.9(L) 35.8(L)  Platelets 150 - 400 K/uL 113(L) 176 130(L)     CMP Latest Ref Rng & Units 12/08/2020 11/25/2020 11/10/2020  Glucose 70 - 99 mg/dL 267(H) 121(H) 197(H)  BUN 8 - 23 mg/dL '12 10 15  ' Creatinine 0.61 - 1.24 mg/dL 0.96 0.81 1.10  Sodium 135 - 145 mmol/L 140 141 141  Potassium 3.5 - 5.1 mmol/L 3.9 3.4(L) 3.9  Chloride 98 - 111 mmol/L 109 109 107  CO2 22 - 32 mmol/L 21(L) 26 24  Calcium 8.9 - 10.3 mg/dL 8.8(L) 8.9 9.4  Total Protein 6.5 - 8.1 g/dL 6.4(L) 6.6 6.4(L)  Total Bilirubin 0.3 - 1.2 mg/dL 0.5 0.8 0.6  Alkaline Phos 38 - 126 U/L 82 80 98  AST 15 - 41 U/L 12(L) 15 10(L)  ALT 0 - 44 U/L '8 11 8      ' RADIOGRAPHIC STUDIES: I have personally reviewed the radiological images as listed and agreed with the findings in the report. No results found.    No problem-specific Assessment & Plan notes found for this encounter.   Orders Placed This Encounter  Procedures   Cancer antigen 19-9    Standing Status:   Standing    Number of Occurrences:   20    Standing Expiration Date:   12/08/2021   All questions were answered. The patient knows to call the clinic with any problems, questions or concerns. No barriers to learning was detected. I spent 20  minutes counseling the patient face to face. The  total time spent in the appointment was 30 minutes and more than 50% was on counseling and review of test results     Truitt Merle, MD 12/08/20

## 2020-12-08 NOTE — Patient Instructions (Signed)
[facility]  Discharge Instructions: Thank you for choosing [facility] to provide your oncology and hematology care.   If you have a lab appointment with the [facility], please go directly to the [facility] and check in at the registration area.   Wear comfortable clothing and clothing appropriate for easy access to any Portacath or PICC line.   We strive to give you quality time with your provider. You may need to reschedule your appointment if you arrive late (15 or more minutes).  Arriving late affects you and other patients whose appointments are after yours.  Also, if you miss three or more appointments without notifying the office, you may be dismissed from the clinic at the provider's discretion.      For prescription refill requests, have your pharmacy contact our office and allow 72 hours for refills to be completed.    Today you received the following chemotherapy and/or immunotherapy agents Abraxane and Gemzar      To help prevent nausea and vomiting after your treatment, we encourage you to take your nausea medication as directed.  BELOW ARE SYMPTOMS THAT SHOULD BE REPORTED IMMEDIATELY: *FEVER GREATER THAN 100.4 F (38 C) OR HIGHER *CHILLS OR SWEATING *NAUSEA AND VOMITING THAT IS NOT CONTROLLED WITH YOUR NAUSEA MEDICATION *UNUSUAL SHORTNESS OF BREATH *UNUSUAL BRUISING OR BLEEDING *URINARY PROBLEMS (pain or burning when urinating, or frequent urination) *BOWEL PROBLEMS (unusual diarrhea, constipation, pain near the anus) TENDERNESS IN MOUTH AND THROAT WITH OR WITHOUT PRESENCE OF ULCERS (sore throat, sores in mouth, or a toothache) UNUSUAL RASH, SWELLING OR PAIN  UNUSUAL VAGINAL DISCHARGE OR ITCHING   Items with * indicate a potential emergency and should be followed up as soon as possible or go to the Emergency Department if any problems should occur.  Please show the CHEMOTHERAPY ALERT CARD or IMMUNOTHERAPY ALERT CARD at  check-in to the Emergency Department and triage nurse.  Should you have questions after your visit or need to cancel or reschedule your appointment, please contact [facility]  Dept: [phone]  and follow the prompts.  Office hours are 8:00 a.m. to 4:30 p.m. Monday - Friday. Please note that voicemails left after 4:00 p.m. may not be returned until the following business day.  We are closed weekends and major holidays. You have access to a nurse at all times for urgent questions. Please call the main number to the clinic Dept: [phone] and follow the prompts.   For any non-urgent questions, you may also contact your provider using MyChart. We now offer e-Visits for anyone 34 and older to request care online for non-urgent symptoms. For details visit [email or web address].   Also download the MyChart app! Go to the app store, search "MyChart", open the app, select [facility], and log in with your MyChart username and password.  Due to Covid, a mask is required upon entering the hospital/clinic. If you do not have a mask, one will be given to you upon arrival. For doctor visits, patients may have 1 support person aged 107 or older with them. For treatment visits, patients cannot have anyone with them due to current Covid guidelines and our immunocompromised population.   Nanoparticle Albumin-Bound Paclitaxel injection What is this medication? NANOPARTICLE ALBUMIN-BOUND PACLITAXEL (Na no PAHR ti kuhl al BYOO muhn-bound PAK li TAX el) is a chemotherapy drug. It targets fast dividing cells, like cancer cells, and causes these cells to die. This medicine is used to treat  advanced breast cancer, lung cancer, and pancreatic cancer. This medicine may be used for other purposes; ask your health care provider or pharmacist if you have questions. COMMON BRAND NAME(S): Abraxane What should I tell my care team before I take this medication? They need to know if you have any  of these conditions: kidney disease liver disease low blood counts, like low white cell, platelet, or red cell counts lung or breathing disease, like asthma tingling of the fingers or toes, or other nerve disorder an unusual or allergic reaction to paclitaxel, albumin, other chemotherapy, other medicines, foods, dyes, or preservatives pregnant or trying to get pregnant breast-feeding How should I use this medication? This drug is given as an infusion into a vein. It is administered in a hospital or clinic by a specially trained health care professional. Talk to your pediatrician regarding the use of this medicine in children. Special care may be needed. Overdosage: If you think you have taken too much of this medicine contact a poison control center or emergency room at once. NOTE: This medicine is only for you. Do not share this medicine with others. What if I miss a dose? It is important not to miss your dose. Call your doctor or health care professional if you are unable to keep an appointment. What may interact with this medication? This medicine may interact with the following medications: antiviral medicines for hepatitis, HIV or AIDS certain antibiotics like erythromycin and clarithromycin certain medicines for fungal infections like ketoconazole and itraconazole certain medicines for seizures like carbamazepine, phenobarbital, phenytoin gemfibrozil nefazodone rifampin St. John's wort This list may not describe all possible interactions. Give your health care provider a list of all the medicines, herbs, non-prescription drugs, or dietary supplements you use. Also tell them if you smoke, drink alcohol, or use illegal drugs. Some items may interact with your medicine. What should I watch for while using this medication? Your condition will be monitored carefully while you are receiving this medicine. You will need important blood work done while you are taking this medicine. This  medicine can cause serious allergic reactions. If you experience allergic reactions like skin rash, itching or hives, swelling of the face, lips, or tongue, tell your doctor or health care professional right away. In some cases, you may be given additional medicines to help with side effects. Follow all directions for their use. This drug may make you feel generally unwell. This is not uncommon, as chemotherapy can affect healthy cells as well as cancer cells. Report any side effects. Continue your course of treatment even though you feel ill unless your doctor tells you to stop. Call your doctor or health care professional for advice if you get a fever, chills or sore throat, or other symptoms of a cold or flu. Do not treat yourself. This drug decreases your body's ability to fight infections. Try to avoid being around people who are sick. This medicine may increase your risk to bruise or bleed. Call your doctor or health care professional if you notice any unusual bleeding. Be careful brushing and flossing your teeth or using a toothpick because you may get an infection or bleed more easily. If you have any dental work done, tell your dentist you are receiving this medicine. Avoid taking products that contain aspirin, acetaminophen, ibuprofen, naproxen, or ketoprofen unless instructed by your doctor. These medicines may hide a fever. Do not become pregnant while taking this medicine or for 6 months after stopping it. Women should inform their  doctor if they wish to become pregnant or think they might be pregnant. Men should not father a child while taking this medicine or for 3 months after stopping it. There is a potential for serious side effects to an unborn child. Talk to your health care professional or pharmacist for more information. Do not breast-feed an infant while taking this medicine or for 2 weeks after stopping it. This medicine may interfere with the ability to get pregnant or to father a  child. You should talk to your doctor or health care professional if you are concerned about your fertility. What side effects may I notice from receiving this medication? Side effects that you should report to your doctor or health care professional as soon as possible: allergic reactions like skin rash, itching or hives, swelling of the face, lips, or tongue breathing problems changes in vision fast, irregular heartbeat low blood pressure mouth sores pain, tingling, numbness in the hands or feet signs of decreased platelets or bleeding - bruising, pinpoint red spots on the skin, black, tarry stools, blood in the urine signs of decreased red blood cells - unusually weak or tired, feeling faint or lightheaded, falls signs of infection - fever or chills, cough, sore throat, pain or difficulty passing urine signs and symptoms of liver injury like dark yellow or brown urine; general ill feeling or flu-like symptoms; light-colored stools; loss of appetite; nausea; right upper belly pain; unusually weak or tired; yellowing of the eyes or skin swelling of the ankles, feet, hands unusually slow heartbeat Side effects that usually do not require medical attention (report to your doctor or health care professional if they continue or are bothersome): diarrhea hair loss loss of appetite nausea, vomiting tiredness This list may not describe all possible side effects. Call your doctor for medical advice about side effects. You may report side effects to FDA at 1-800-FDA-1088. Where should I keep my medication? This drug is given in a hospital or clinic and will not be stored at home. NOTE: This sheet is a summary. It may not cover all possible information. If you have questions about this medicine, talk to your doctor, pharmacist, or health care provider.  2022 Elsevier/Gold Standard (2016-10-04 13:03:45)  Gemcitabine injection What is this medication? GEMCITABINE (jem SYE ta been) is a  chemotherapy drug. This medicine is used to treat many types of cancer like breast cancer, lung cancer, pancreatic cancer, and ovarian cancer. This medicine may be used for other purposes; ask your health care provider or pharmacist if you have questions. COMMON BRAND NAME(S): Gemzar, Infugem What should I tell my care team before I take this medication? They need to know if you have any of these conditions: blood disorders infection kidney disease liver disease lung or breathing disease, like asthma recent or ongoing radiation therapy an unusual or allergic reaction to gemcitabine, other chemotherapy, other medicines, foods, dyes, or preservatives pregnant or trying to get pregnant breast-feeding How should I use this medication? This drug is given as an infusion into a vein. It is administered in a hospital or clinic by a specially trained health care professional. Talk to your pediatrician regarding the use of this medicine in children. Special care may be needed. Overdosage: If you think you have taken too much of this medicine contact a poison control center or emergency room at once. NOTE: This medicine is only for you. Do not share this medicine with others. What if I miss a dose? It is important not to miss  your dose. Call your doctor or health care professional if you are unable to keep an appointment. What may interact with this medication? medicines to increase blood counts like filgrastim, pegfilgrastim, sargramostim some other chemotherapy drugs like cisplatin vaccines Talk to your doctor or health care professional before taking any of these medicines: acetaminophen aspirin ibuprofen ketoprofen naproxen This list may not describe all possible interactions. Give your health care provider a list of all the medicines, herbs, non-prescription drugs, or dietary supplements you use. Also tell them if you smoke, drink alcohol, or use illegal drugs. Some items may interact with  your medicine. What should I watch for while using this medication? Visit your doctor for checks on your progress. This drug may make you feel generally unwell. This is not uncommon, as chemotherapy can affect healthy cells as well as cancer cells. Report any side effects. Continue your course of treatment even though you feel ill unless your doctor tells you to stop. In some cases, you may be given additional medicines to help with side effects. Follow all directions for their use. Call your doctor or health care professional for advice if you get a fever, chills or sore throat, or other symptoms of a cold or flu. Do not treat yourself. This drug decreases your body's ability to fight infections. Try to avoid being around people who are sick. This medicine may increase your risk to bruise or bleed. Call your doctor or health care professional if you notice any unusual bleeding. Be careful brushing and flossing your teeth or using a toothpick because you may get an infection or bleed more easily. If you have any dental work done, tell your dentist you are receiving this medicine. Avoid taking products that contain aspirin, acetaminophen, ibuprofen, naproxen, or ketoprofen unless instructed by your doctor. These medicines may hide a fever. Do not become pregnant while taking this medicine or for 6 months after stopping it. Women should inform their doctor if they wish to become pregnant or think they might be pregnant. Men should not father a child while taking this medicine and for 3 months after stopping it. There is a potential for serious side effects to an unborn child. Talk to your health care professional or pharmacist for more information. Do not breast-feed an infant while taking this medicine or for at least 1 week after stopping it. Men should inform their doctors if they wish to father a child. This medicine may lower sperm counts. Talk with your doctor or health care professional if you are  concerned about your fertility. What side effects may I notice from receiving this medication? Side effects that you should report to your doctor or health care professional as soon as possible: allergic reactions like skin rash, itching or hives, swelling of the face, lips, or tongue breathing problems pain, redness, or irritation at site where injected signs and symptoms of a dangerous change in heartbeat or heart rhythm like chest pain; dizziness; fast or irregular heartbeat; palpitations; feeling faint or lightheaded, falls; breathing problems signs of decreased platelets or bleeding - bruising, pinpoint red spots on the skin, black, tarry stools, blood in the urine signs of decreased red blood cells - unusually weak or tired, feeling faint or lightheaded, falls signs of infection - fever or chills, cough, sore throat, pain or difficulty passing urine signs and symptoms of kidney injury like trouble passing urine or change in the amount of urine signs and symptoms of liver injury like dark yellow or brown urine;  general ill feeling or flu-like symptoms; light-colored stools; loss of appetite; nausea; right upper belly pain; unusually weak or tired; yellowing of the eyes or skin swelling of ankles, feet, hands Side effects that usually do not require medical attention (report to your doctor or health care professional if they continue or are bothersome): constipation diarrhea hair loss loss of appetite nausea rash vomiting This list may not describe all possible side effects. Call your doctor for medical advice about side effects. You may report side effects to FDA at 1-800-FDA-1088. Where should I keep my medication? This drug is given in a hospital or clinic and will not be stored at home. NOTE: This sheet is a summary. It may not cover all possible information. If you have questions about this medicine, talk to your doctor, pharmacist, or health care provider.  2022 Elsevier/Gold  Standard (2017-04-26 18:06:11)

## 2020-12-09 ENCOUNTER — Telehealth: Payer: Self-pay | Admitting: Hematology

## 2020-12-09 NOTE — Telephone Encounter (Signed)
Left message with follow-up appointment per 10/25 los.

## 2020-12-15 ENCOUNTER — Telehealth: Payer: Self-pay

## 2020-12-15 NOTE — Telephone Encounter (Signed)
I received a call from Mr. Kau wife, Precious Bard asking about his last visit note. His provider notes normally show the date and time of his chemo, which he needs for insurance purposes. The last note did not include the date of the chemo administered, therefore his insurance would not cover it. I was able to send Precious Bard the dates of the drug administered so she could send it to his insurance. All questions were answered. Instructed to call back with any further questions/concerns. Patti verbalized understanding.

## 2020-12-21 ENCOUNTER — Other Ambulatory Visit: Payer: Self-pay | Admitting: Physician Assistant

## 2020-12-21 DIAGNOSIS — C259 Malignant neoplasm of pancreas, unspecified: Secondary | ICD-10-CM

## 2020-12-22 ENCOUNTER — Other Ambulatory Visit: Payer: Self-pay

## 2020-12-22 ENCOUNTER — Inpatient Hospital Stay: Payer: 59

## 2020-12-22 ENCOUNTER — Inpatient Hospital Stay: Payer: 59 | Attending: Genetic Counselor | Admitting: Physician Assistant

## 2020-12-22 VITALS — BP 166/106 | HR 80 | Temp 97.2°F | Resp 17 | Wt 194.4 lb

## 2020-12-22 DIAGNOSIS — C787 Secondary malignant neoplasm of liver and intrahepatic bile duct: Secondary | ICD-10-CM | POA: Diagnosis present

## 2020-12-22 DIAGNOSIS — Z5111 Encounter for antineoplastic chemotherapy: Secondary | ICD-10-CM | POA: Diagnosis not present

## 2020-12-22 DIAGNOSIS — G620 Drug-induced polyneuropathy: Secondary | ICD-10-CM

## 2020-12-22 DIAGNOSIS — C7800 Secondary malignant neoplasm of unspecified lung: Secondary | ICD-10-CM | POA: Diagnosis not present

## 2020-12-22 DIAGNOSIS — E1165 Type 2 diabetes mellitus with hyperglycemia: Secondary | ICD-10-CM | POA: Diagnosis not present

## 2020-12-22 DIAGNOSIS — E039 Hypothyroidism, unspecified: Secondary | ICD-10-CM | POA: Insufficient documentation

## 2020-12-22 DIAGNOSIS — Z95828 Presence of other vascular implants and grafts: Secondary | ICD-10-CM

## 2020-12-22 DIAGNOSIS — C259 Malignant neoplasm of pancreas, unspecified: Secondary | ICD-10-CM

## 2020-12-22 DIAGNOSIS — G893 Neoplasm related pain (acute) (chronic): Secondary | ICD-10-CM | POA: Diagnosis not present

## 2020-12-22 LAB — CBC WITH DIFFERENTIAL/PLATELET
Abs Immature Granulocytes: 0.02 10*3/uL (ref 0.00–0.07)
Basophils Absolute: 0 10*3/uL (ref 0.0–0.1)
Basophils Relative: 1 %
Eosinophils Absolute: 0 10*3/uL (ref 0.0–0.5)
Eosinophils Relative: 1 %
HCT: 33.5 % — ABNORMAL LOW (ref 39.0–52.0)
Hemoglobin: 11.9 g/dL — ABNORMAL LOW (ref 13.0–17.0)
Immature Granulocytes: 0 %
Lymphocytes Relative: 26 %
Lymphs Abs: 1.2 10*3/uL (ref 0.7–4.0)
MCH: 33.7 pg (ref 26.0–34.0)
MCHC: 35.5 g/dL (ref 30.0–36.0)
MCV: 94.9 fL (ref 80.0–100.0)
Monocytes Absolute: 0.6 10*3/uL (ref 0.1–1.0)
Monocytes Relative: 12 %
Neutro Abs: 2.8 10*3/uL (ref 1.7–7.7)
Neutrophils Relative %: 60 %
Platelets: 198 10*3/uL (ref 150–400)
RBC: 3.53 MIL/uL — ABNORMAL LOW (ref 4.22–5.81)
RDW: 13.2 % (ref 11.5–15.5)
WBC: 4.7 10*3/uL (ref 4.0–10.5)
nRBC: 0 % (ref 0.0–0.2)

## 2020-12-22 LAB — CMP (CANCER CENTER ONLY)
ALT: 9 U/L (ref 0–44)
AST: 11 U/L — ABNORMAL LOW (ref 15–41)
Albumin: 4 g/dL (ref 3.5–5.0)
Alkaline Phosphatase: 96 U/L (ref 38–126)
Anion gap: 8 (ref 5–15)
BUN: 15 mg/dL (ref 8–23)
CO2: 23 mmol/L (ref 22–32)
Calcium: 9 mg/dL (ref 8.9–10.3)
Chloride: 109 mmol/L (ref 98–111)
Creatinine: 0.84 mg/dL (ref 0.61–1.24)
GFR, Estimated: >60 mL/min (ref >60)
Glucose, Bld: 103 mg/dL — ABNORMAL HIGH (ref 70–99)
Potassium: 3.9 mmol/L (ref 3.5–5.1)
Sodium: 140 mmol/L (ref 135–145)
Total Bilirubin: 0.6 mg/dL (ref 0.3–1.2)
Total Protein: 6.9 g/dL (ref 6.5–8.1)

## 2020-12-22 MED ORDER — PROCHLORPERAZINE MALEATE 10 MG PO TABS
10.0000 mg | ORAL_TABLET | Freq: Once | ORAL | Status: AC
  Administered 2020-12-22: 10 mg via ORAL
  Filled 2020-12-22: qty 1

## 2020-12-22 MED ORDER — SODIUM CHLORIDE 0.9 % IV SOLN: Freq: Once | INTRAVENOUS | Status: AC

## 2020-12-22 MED ORDER — SODIUM CHLORIDE 0.9 % IV SOLN
1000.0000 mg/m2 | Freq: Once | INTRAVENOUS | Status: AC
  Administered 2020-12-22: 2052 mg via INTRAVENOUS
  Filled 2020-12-22: qty 53.97

## 2020-12-22 MED ORDER — SODIUM CHLORIDE 0.9% FLUSH
10.0000 mL | INTRAVENOUS | Status: DC | PRN
  Administered 2020-12-22: 10 mL

## 2020-12-22 MED ORDER — SODIUM CHLORIDE 0.9% FLUSH
10.0000 mL | Freq: Once | INTRAVENOUS | Status: AC
  Administered 2020-12-22: 10 mL

## 2020-12-22 MED ORDER — HEPARIN SOD (PORK) LOCK FLUSH 100 UNIT/ML IV SOLN
500.0000 [IU] | Freq: Once | INTRAVENOUS | Status: AC | PRN
  Administered 2020-12-22: 500 [IU]

## 2020-12-22 NOTE — Patient Instructions (Signed)
[facility]  Discharge Instructions: Thank you for choosing [facility] to provide your oncology and hematology care.   If you have a lab appointment with the Cancer Center, please go directly to the Cancer Center and check in at the registration area.   Wear comfortable clothing and clothing appropriate for easy access to any Portacath or PICC line.   We strive to give you quality time with your provider. You may need to reschedule your appointment if you arrive late (15 or more minutes).  Arriving late affects you and other patients whose appointments are after yours.  Also, if you miss three or more appointments without notifying the office, you may be dismissed from the clinic at the provider's discretion.      For prescription refill requests, have your pharmacy contact our office and allow 72 hours for refills to be completed.    Today you received the following chemotherapy and/or immunotherapy agents Gemzar      To help prevent nausea and vomiting after your treatment, we encourage you to take your nausea medication as directed.  BELOW ARE SYMPTOMS THAT SHOULD BE REPORTED IMMEDIATELY: *FEVER GREATER THAN 100.4 F (38 C) OR HIGHER *CHILLS OR SWEATING *NAUSEA AND VOMITING THAT IS NOT CONTROLLED WITH YOUR NAUSEA MEDICATION *UNUSUAL SHORTNESS OF BREATH *UNUSUAL BRUISING OR BLEEDING *URINARY PROBLEMS (pain or burning when urinating, or frequent urination) *BOWEL PROBLEMS (unusual diarrhea, constipation, pain near the anus) TENDERNESS IN MOUTH AND THROAT WITH OR WITHOUT PRESENCE OF ULCERS (sore throat, sores in mouth, or a toothache) UNUSUAL RASH, SWELLING OR PAIN  UNUSUAL VAGINAL DISCHARGE OR ITCHING   Items with * indicate a potential emergency and should be followed up as soon as possible or go to the Emergency Department if any problems should occur.  Please show the CHEMOTHERAPY ALERT CARD or IMMUNOTHERAPY ALERT CARD at check-in to the  Emergency Department and triage nurse.  Should you have questions after your visit or need to cancel or reschedule your appointment, please contact [facility]  Dept: [phone]  and follow the prompts.  Office hours are 8:00 a.m. to 4:30 p.m. Monday - Friday. Please note that voicemails left after 4:00 p.m. may not be returned until the following business day.  We are closed weekends and major holidays. You have access to a nurse at all times for urgent questions. Please call the main number to the clinic Dept: [phone] and follow the prompts.   For any non-urgent questions, you may also contact your provider using MyChart. We now offer e-Visits for anyone 18 and older to request care online for non-urgent symptoms. For details visit [email or web address].   Also download the MyChart app! Go to the app store, search "MyChart", open the app, select [facility], and log in with your MyChart username and password.  Due to Covid, a mask is required upon entering the hospital/clinic. If you do not have a mask, one will be given to you upon arrival. For doctor visits, patients may have 1 support person aged 18 or older with them. For treatment visits, patients cannot have anyone with them due to current Covid guidelines and our immunocompromised population.   

## 2020-12-22 NOTE — Progress Notes (Signed)
[name]   [phone] [phone]   Clinic Follow up Note   Patient Care Team: [name], MD as PCP - General (Hematology and Oncology) Gatha Mayer, MD as Consulting Physician (Gastroenterology) [name], MD as Consulting Physician (Otolaryngology) Elayne Snare, MD as Consulting Physician (Endocrinology) Gaye Pollack, MD as Consulting Physician (Cardiothoracic Surgery) Lucas Mallow, MD as Consulting Physician (Urology) Debara Pickett Nadean Corwin, MD as Consulting Physician (Cardiology) [name], MD as Consulting Physician (Hematology and Oncology) Jonnie Finner, RN (Inactive) as Oncology Nurse Navigator 12/22/2020  CHIEF COMPLAINT: f/u pancreatic cancer   ASSESSMENT & PLAN: 65 yo male   Pancreatic adenocarcinoma ([place]), 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 Neuropathy continues to worsens so we will hold abraxane for today's treatment. Dr. Burr Medico will reassess prior to cycle 3, day 1 if abraxane can be added back.     Cancer related pain -He uses oxycodone 2-3 times a week -Pain is overall stable around 3/10 and takes pain medication as needed.  -Today pain has increased to 5/10. Advised to monitor and follow up with the clinic if pain worsens over the next few days.   Anxiety about health -He has been taking Ativan as needed for anxiety.    Neuropathy due to chemotherapeutic drug ([place]) -Secondary to oxaliplatin and Abraxane -Continues to worsen with recent dose reduction of Abraxane dose to 50 mg/m. Now affecting grip and dexterity. Will hold abraxane for today's treatment.  -He is also diabetic, no neuropathy before chemo -Currently on gabapentin 100 mg BID and titrating to 100 mg TID.   5.  Poorly controlled diabetes, hypothyroidism -He is on insulin, blood glucose has  been fluctuating significantly, I encouraged him to follow-up with his endocrinologist  Plan -Due to worsening neuropathy, will hold Abraxane today.  -Lab from today review, adequate for treatment and no intervention required. CA19-9 level is within normal limits. Proceed with cycle 2, day 15 of gemcitabine alone.  -Recommend to increase gabapentin 100 mg from BID to TID as tolerated. -He will return in 2 weeks to see Dr. Burr Medico prior to cycle 3, day 1.   SUMMARY OF ONCOLOGIC HISTORY: Oncology History  Adenocarcinoma of pancreas, stage 4 ([place])  06/20/2020 Initial Diagnosis   Adenocarcinoma of pancreas, stage 4 ([place])   07/01/2020 - 10/21/2020 Chemotherapy          07/01/2020 Genetic Testing   Negative genetic testing on a custom panel reviewing multicancer and pancreatitis genes.  The Custom-Gene Panel offered by [facility] includes sequencing and/or deletion duplication testing of the following 91 genes: AIP, ALK, APC*, ATM*, AXIN2, BAP1, BARD1, BLM, BMPR1A, BRCA1, BRCA2, BRIP1, CASR, CDC73, CDH1, CDK4, CDKN1B, CDKN1C, CDKN2A (p14ARF), CDKN2A (p16INK4a), CEBPA, CFTR*, CHEK2, CPA1, CTNNA1, CTRC, DICER1*, DIS3L2, EGFR, EPCAM*, FANCC, FH*, FLCN, GATA2, GPC3*, GREM1*, HOXB13, HRAS, KIT, MAX*, MEN1*, MET*, MITF*, MLH1*, MSH2*, MSH3*, MSH6*, MUTYH, NBN, NF1*, NF2, NTHL1, PALB2, PALLD, PDGFRA, PHOX2B*, PMS2*, POLD1*, POLE, POT1, PRKAR1A, PRSS1*, PTCH1, PTEN*, RAD50, RAD51C, RAD51D, RB1*, RECQL4*, RET, RUNX1, SDHA*, SDHAF2, SDHB, SDHC*, SDHD, SMAD4, SMARCA4, SMARCB1, SMARCE1, SPINK1, STK11, SUFU, TERC, TERT, TMEM127, TP53, TSC1*, TSC2, VHL, WRN*, and WT1.  The report date is Jul 01, 2020.    Imaging   Imaging after 4 cycles of therapy  IMPRESSION: 1. Pancreatic neck mass is stable. 2. No new or progressive metastatic  disease. Tiny soft tissue nodule along the posteroinferior right liver capsule is stable. Solitary liver metastasis adjacent to the gallbladder is mildly decreased. Small bilateral pulmonary  nodules are all stable. 3. New small nonocclusive thrombosis in the main portal vein. Persistent near occlusion of the proximal main portal vein with narrowing of the portal splenic venous confluence. 4. Aortic Atherosclerosis (ICD10-I70.0).   11/10/2020 -  Chemotherapy   Patient is on Treatment Plan : PANCREATIC Abraxane / Gemcitabine D1,8,15 q28d       CURRENT THERAPY: Second line gemcitabine and Abraxane every 2 weeks  INTERVAL HISTORY: Timothy Page returns for follow-up prior to cycle 2, day 15 of gemcitabine and Abraxane.  He is unaccompanied for this visit.  Patient reports having fatigue and appetite loss for approximately 3 to 4 days following chemotherapy, which slowly improves afterwards.  He is able to complete his daily routines independently.  He has lost approximately 5 pounds in the last 2 weeks.  He has nausea for several days following chemotherapy which is controlled with prescribed antiemetics.  He denies any vomiting episodes.  Patient reports left-sided flank pain that has increased in intensity today.  He rates the pain as 5 out of 10 on a pain scale.  He takes oxycodone 5 mg as needed with improvement of pain.  He reports constipation that is well controlled with stool softeners and MiraLAX.  He does have daily bowel movements.  He denies easy bruising or signs of bleeding.  Patient reports worsening neuropathy now affecting his hands and toes.  He reports the neuropathy is interfering with his grip and dexterity, not dropping things.  Additionally, he has noticed some interference with balance but denies any recent falls.  Patient is currently on gabapentin 100 mg twice daily with plans to titrate to 3 times a day.  He denies any fevers, chills, night sweats, shortness of breath, chest pain or cough.  He has no other complaints.   All other systems were reviewed with the patient and are negative.  MEDICAL HISTORY:  Past Medical History:  Diagnosis Date   Arthritis     Atypical chest pain 04/2017   ACS ruled out 05/11/17   CAP (community acquired pneumonia) 05/07/2017   Chronic constipation    at least partially d/t chronic opioids   Diabetes mellitus without complication ([place])    Poor control starting fall 2021-->GAD ab neg.  Insulin and C peptide levels wnl but on lower end of normal->Dr Kuma/endo 2022.   Exocrine pancreatic insufficiency 2022   History of thyroidectomy, total 2018   Hurlthe cell adenoma   Hx of adenomatous polyp of colon 07/02/2009   rpt colonoscopy 2026   Hyperlipidemia    Intol of multiple statins and zetia. [facility] clinic 2022.   Hypertension    Lipoma    back   Myocardial infarction [facility])    MI at age 81   Pancreatic adenocarcinoma ([place]) 06/2020   Mets to liver (?and lungs?->stage 4), w/acute pancreatitis.   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 Cammy Copa   Right ureteral calculus 04/2018   Cystoscopy with right retrograde pyelogram and right ureteral stent placement after stone removal.   Seasonal allergies    Stroke ([place])    in 46K no complications  Tachyarrhythmia 1980   age 56 X 2 over 34 month period.  No recurrence since that time.   Thoracic aortic aneurysm    Incidentally discovered, 4.1 cm-->Dr. Cyndia Bent. Rpt CT 08/2018 and 08/2019 stable.   Vocal cord paralysis 07/2016   Left vocal cord paralysis noted after thyroid surgery.    SURGICAL HISTORY: Past Surgical History:  Procedure Laterality Date   BIOPSY  06/18/2020   Procedure: BIOPSY;  Surgeon: Mansouraty, Telford Nab., MD;  Location: [facility] ENDOSCOPY;  Service: Gastroenterology;;   [place] Two W/ POLYPECTOMY  07/02/2009; 01/24/20   2011 adenoma.  2021 adenomas x 3->recall 2026   CYSTOSCOPY WITH  RETROGRADE PYELOGRAM, URETEROSCOPY AND STENT PLACEMENT Right 05/09/2018   Procedure: CYSTOSCOPY WITH RIGHT RETROGRADE RIGHT URETEROSCOPY STONE EXTRACTION RIGHT STENT EXCHANGE;  Surgeon: Lucas Mallow, MD;  Location: [facility] ORS;  Service: Urology;  Laterality: Right;   CYSTOSCOPY/RETROGRADE/URETEROSCOPY/STONE EXTRACTION WITH BASKET Right 04/30/2018   Procedure: CYSTOSCOPY/RETROGRADE/RIGHT URETEROSCOPY/;  Surgeon: Lucas Mallow, MD;  Location: [facility] ORS;  Service: Urology;  Laterality: Right;   ESOPHAGOGASTRODUODENOSCOPY  07/02/2009   NORMAL   ESOPHAGOGASTRODUODENOSCOPY (EGD) WITH PROPOFOL N/A 06/18/2020   Procedure: ESOPHAGOGASTRODUODENOSCOPY (EGD) WITH PROPOFOL;  Surgeon: Rush Landmark Telford Nab., MD;  Location: [facility];  Service: Gastroenterology;  Laterality: N/A;   EYE SURGERY     FINE NEEDLE ASPIRATION  06/18/2020   Procedure: FINE NEEDLE ASPIRATION (FNA) LINEAR;  Surgeon: Irving Copas., MD;  Location: [facility];  Service: Gastroenterology;;   finger amputaion     with reattachement   INCISION AND DRAINAGE ABSCESS ANAL  11/23/2019   gen surg (In ED).   IR IMAGING GUIDED PORT INSERTION  06/22/2020   LASIK     THYROIDECTOMY N/A 08/03/2016   Procedure: Total THYROIDECTOMY;  Surgeon: [name], MD;  Location: [facility];  Service: ENT;  Laterality: N/A;  Total Thyroidectomy    UPPER ESOPHAGEAL ENDOSCOPIC ULTRASOUND (EUS) N/A 06/18/2020   Procedure: UPPER ESOPHAGEAL ENDOSCOPIC ULTRASOUND (EUS);  Surgeon: Irving Copas., MD;  Location: [facility];  Service: Gastroenterology;  Laterality: N/A;   UPPER GASTROINTESTINAL ENDOSCOPY     with EUS and bx of pancreatic mass. +appearance of candida in esoph.      I have reviewed the social history and family history with the patient and they are unchanged from previous note.  ALLERGIES:  is allergic to atenolol, hydroxyzine, tetracycline, atorvastatin, codeine, guaifenesin, and mucinex [guaifenesin er].  MEDICATIONS:  Current  Outpatient Medications  Medication Sig Dispense Refill   Continuous Blood Gluc Sensor (FREESTYLE LIBRE 2 SENSOR) MISC APPLY 1 SENSOR TO THE SKIN EVERY 14 DAYS AS DIRECTED 2 each 3   docusate sodium (COLACE) 100 MG capsule Take 100 mg by mouth 2 (two) times daily.     ELIQUIS 5 MG TABS tablet TAKE 1 TABLET(5 MG) BY MOUTH TWICE DAILY 60 tablet 1   gabapentin (NEURONTIN) 100 MG capsule Take 1 capsule (100 mg total) by mouth at bedtime. Ok to increase to 231m at bedtime in second week and 3031mat bed time on third week if needed 90 capsule 0   HUMALOG KWIKPEN 100 UNIT/ML KwikPen Inject 20-22 Units into the skin daily.     insulin glargine (LANTUS SOLOSTAR) 100 UNIT/ML Solostar Pen Give up to 10 units under the skin every evening at bedtime. 15 mL 1   Insulin Pen Needle 31G X 5 MM MISC Use with insulin pen 3 times a day 100 each 1   lactulose (CHRONULAC)  10 GM/15ML solution Take 15 mLs (10 g total) by mouth 3 (three) times daily as needed for mild constipation. 236 mL 0   levothyroxine (SYNTHROID) 137 MCG tablet TAKE 1 TABLET BY MOUTH  DAILY BEFORE BREAKFAST 90 tablet 3   lipase/protease/amylase (CREON) 36000 UNITS CPEP capsule Take 2 capsules (72,000 Units total) by mouth 3 (three) times daily with meals. May also take 1 capsule (36,000 Units total) as needed (with snacks). 240 capsule 11   LORazepam (ATIVAN) 0.5 MG tablet Take 1 tablet (0.5 mg total) by mouth every 12 (twelve) hours as needed for anxiety. 60 tablet 0   magnesium hydroxide (MILK OF MAGNESIA) 400 MG/5ML suspension Take by mouth daily as needed for mild constipation.     omeprazole (PRILOSEC) 20 MG capsule TAKE 1 CAPSULE(20 MG) BY MOUTH DAILY 30 capsule 0   oxyCODONE (OXY IR/ROXICODONE) 5 MG immediate release tablet Take 1 tablet (5 mg total) by mouth every 6 (six) hours as needed for severe pain or moderate pain. 60 tablet 0   polyethylene glycol (MIRALAX / GLYCOLAX) 17 g packet Take 17 g by mouth daily.     prochlorperazine  (COMPAZINE) 10 MG tablet TAKE 1 TABLET(10 MG) BY MOUTH EVERY 6 HOURS AS NEEDED FOR NAUSEA OR VOMITING 30 tablet 1   ramipril (ALTACE) 10 MG capsule Take 10 mg by mouth 2 (two) times daily.     senna-docusate (SENOKOT-S) 8.6-50 MG tablet Take 1 tablet by mouth 2 (two) times daily.     magic mouthwash SOLN SWISH WITH 30 MLS BY MOUTH FOUR TIMES DAILY AND SWALLOW. NO FOOD OR DRINK FOR 1 HOUR AFTERWARDS (Patient not taking: Reported on 12/22/2020)     No current facility-administered medications for this visit.   Facility-Administered Medications Ordered in Other Visits  Medication Dose Route Frequency Provider Last Rate Last Admin   LORazepam (ATIVAN) tablet 0.5 mg  0.5 mg Oral Once Truitt Merle, MD        PHYSICAL EXAMINATION: ECOG PERFORMANCE STATUS: 1 - Symptomatic but completely ambulatory  Vitals:   12/22/20 1259  BP: (!) 166/106  Pulse: 80  Resp: 17  Temp: (!) 97.2 F (36.2 C)  SpO2: 98%   Filed Weights   12/22/20 1259  Weight: 194 lb 7 oz (88.2 kg)    GENERAL:alert, no distress and comfortable SKIN: skin color, texture, turgor are normal, no rashes or significant lesions ABDOMEN: Tenderness to light palpation in the left side of the abdomen. No guarding.  Musculoskeletal:no cyanosis of digits and no clubbing  NEURO: alert & oriented x 3 with fluent speech  LABORATORY DATA:  I have reviewed the data as listed CBC Latest Ref Rng & Units 12/22/2020 12/08/2020 11/25/2020  WBC 4.0 - 10.5 K/uL 4.7 3.8(L) 5.7  Hemoglobin 13.0 - 17.0 g/dL 11.9(L) 10.9(L) 11.5(L)  Hematocrit 39.0 - 52.0 % 33.5(L) 31.7(L) 32.9(L)  Platelets 150 - 400 K/uL 198 113(L) 176     CMP Latest Ref Rng & Units 12/22/2020 12/08/2020 11/25/2020  Glucose 70 - 99 mg/dL 103(H) 267(H) 121(H)  BUN 8 - 23 mg/dL _0 Creatinine 0.61 - 1.24 mg/dL 0.84 0.96 0.81  Sodium 135 - 145 mmol/L 140 140 141  Potassium 3.5 - 5.1 mmol/L 3.9 3.9 3.4(L)  Chloride 98 - 111 mmol/L 109 109 109  CO2 22 - 32 mmol/L 23 21(L) 26   Calcium 8.9 - 10.3 mg/dL 9.0 8.8(L) 8.9  Total Protein 6.5 - 8.1 g/dL 6.9 6.4(L) 6.6  Total Bilirubin 0.3 - 1.2  mg/dL 0.6 0.5 0.8  Alkaline Phos 38 - 126 U/L 96 82 80  AST 15 - 41 U/L 11(L) 12(L) 15  ALT 0 - 44 U/L _0 RADIOGRAPHIC STUDIES: I have personally reviewed the radiological images as listed and agreed with the findings in the report. No results found.    No problem-specific Assessment & Plan notes found for this encounter.   No orders of the defined types were placed in this encounter.  Patient expressed understanding and satisfaction with the plan provided.   I have spent a total of 30 minutes minutes of face-to-face and non-face-to-face time, preparing to see the patient, performing a medically appropriate examination, counseling and educating the patient, ordering medications/tests/procedures, documenting clinical information in the electronic health record, and care coordination.   Timothy Brigham, Timothy Page Hematology and Oncology [facility] [place] P: [phone]

## 2020-12-23 LAB — CANCER ANTIGEN 19-9: CA 19-9: 23 U/mL (ref 0–35)

## 2020-12-24 ENCOUNTER — Encounter: Payer: Self-pay | Admitting: Hematology and Oncology

## 2020-12-24 DIAGNOSIS — G620 Drug-induced polyneuropathy: Secondary | ICD-10-CM | POA: Insufficient documentation

## 2020-12-24 DIAGNOSIS — Z5111 Encounter for antineoplastic chemotherapy: Secondary | ICD-10-CM | POA: Insufficient documentation

## 2020-12-25 NOTE — Progress Notes (Signed)
I received an email from Mr. Seyfried wife, Precious Bard, regarding the billing information for his infusion on 11/8. It wasn't included in the note, so I sent her the information again for insurance. I reached out to [place], RN to see if the provider will be able to place this in his note going forward.

## 2020-12-27 ENCOUNTER — Other Ambulatory Visit: Payer: Self-pay | Admitting: Hematology and Oncology

## 2020-12-31 ENCOUNTER — Encounter: Payer: Self-pay | Admitting: Hematology

## 2021-01-04 ENCOUNTER — Other Ambulatory Visit (HOSPITAL_COMMUNITY): Payer: Self-pay | Admitting: *Deleted

## 2021-01-05 ENCOUNTER — Encounter: Payer: Self-pay | Admitting: Hematology

## 2021-01-05 ENCOUNTER — Inpatient Hospital Stay (HOSPITAL_BASED_OUTPATIENT_CLINIC_OR_DEPARTMENT_OTHER): Payer: 59 | Admitting: Hematology

## 2021-01-05 ENCOUNTER — Ambulatory Visit: Payer: 59

## 2021-01-05 ENCOUNTER — Other Ambulatory Visit: Payer: Self-pay

## 2021-01-05 ENCOUNTER — Inpatient Hospital Stay: Payer: 59

## 2021-01-05 ENCOUNTER — Other Ambulatory Visit: Payer: 59

## 2021-01-05 VITALS — BP 170/102

## 2021-01-05 DIAGNOSIS — C259 Malignant neoplasm of pancreas, unspecified: Secondary | ICD-10-CM | POA: Diagnosis not present

## 2021-01-05 DIAGNOSIS — Z95828 Presence of other vascular implants and grafts: Secondary | ICD-10-CM

## 2021-01-05 DIAGNOSIS — Z5111 Encounter for antineoplastic chemotherapy: Secondary | ICD-10-CM | POA: Diagnosis not present

## 2021-01-05 LAB — CBC WITH DIFFERENTIAL (CANCER CENTER ONLY)
Abs Immature Granulocytes: 0.01 10*3/uL (ref 0.00–0.07)
Basophils Absolute: 0 10*3/uL (ref 0.0–0.1)
Basophils Relative: 1 %
Eosinophils Absolute: 0 10*3/uL (ref 0.0–0.5)
Eosinophils Relative: 1 %
HCT: 33.6 % — ABNORMAL LOW (ref 39.0–52.0)
Hemoglobin: 11.6 g/dL — ABNORMAL LOW (ref 13.0–17.0)
Immature Granulocytes: 0 %
Lymphocytes Relative: 25 %
Lymphs Abs: 1 10*3/uL (ref 0.7–4.0)
MCH: 32.8 pg (ref 26.0–34.0)
MCHC: 34.5 g/dL (ref 30.0–36.0)
MCV: 94.9 fL (ref 80.0–100.0)
Monocytes Absolute: 0.4 10*3/uL (ref 0.1–1.0)
Monocytes Relative: 11 %
Neutro Abs: 2.4 10*3/uL (ref 1.7–7.7)
Neutrophils Relative %: 62 %
Platelet Count: 126 10*3/uL — ABNORMAL LOW (ref 150–400)
RBC: 3.54 MIL/uL — ABNORMAL LOW (ref 4.22–5.81)
RDW: 12.5 % (ref 11.5–15.5)
WBC Count: 3.8 10*3/uL — ABNORMAL LOW (ref 4.0–10.5)
nRBC: 0 % (ref 0.0–0.2)

## 2021-01-05 LAB — CMP (CANCER CENTER ONLY)
ALT: 8 U/L (ref 0–44)
AST: 12 U/L — ABNORMAL LOW (ref 15–41)
Albumin: 3.9 g/dL (ref 3.5–5.0)
Alkaline Phosphatase: 104 U/L (ref 38–126)
Anion gap: 7 (ref 5–15)
BUN: 11 mg/dL (ref 8–23)
CO2: 24 mmol/L (ref 22–32)
Calcium: 8.9 mg/dL (ref 8.9–10.3)
Chloride: 105 mmol/L (ref 98–111)
Creatinine: 1.01 mg/dL (ref 0.61–1.24)
GFR, Estimated: >60 mL/min (ref >60)
Glucose, Bld: 301 mg/dL — ABNORMAL HIGH (ref 70–99)
Potassium: 4.3 mmol/L (ref 3.5–5.1)
Sodium: 136 mmol/L (ref 135–145)
Total Bilirubin: 0.6 mg/dL (ref 0.3–1.2)
Total Protein: 6.9 g/dL (ref 6.5–8.1)

## 2021-01-05 LAB — TSH: TSH: 0.437 u[IU]/mL (ref 0.320–4.118)

## 2021-01-05 LAB — T4, FREE: Free T4: 1.36 ng/dL — ABNORMAL HIGH (ref 0.61–1.12)

## 2021-01-05 MED ORDER — SODIUM CHLORIDE 0.9 % IV SOLN: Freq: Once | INTRAVENOUS | Status: AC

## 2021-01-05 MED ORDER — GABAPENTIN 300 MG PO CAPS: 300.0000 mg | ORAL_CAPSULE | Freq: Three times a day (TID) | ORAL | 1 refills | Status: DC

## 2021-01-05 MED ORDER — APIXABAN 5 MG PO TABS
ORAL_TABLET | ORAL | 1 refills | Status: DC
Start: 2021-01-05 — End: 2021-03-02

## 2021-01-05 MED ORDER — PROCHLORPERAZINE MALEATE 10 MG PO TABS: ORAL_TABLET | ORAL | 1 refills | Status: DC

## 2021-01-05 MED ORDER — SODIUM CHLORIDE 0.9% FLUSH
10.0000 mL | Freq: Once | INTRAVENOUS | Status: AC
  Administered 2021-01-05: 10 mL

## 2021-01-05 MED ORDER — PROCHLORPERAZINE MALEATE 10 MG PO TABS: 10.0000 mg | ORAL_TABLET | Freq: Once | ORAL | Status: DC

## 2021-01-05 MED ORDER — SODIUM CHLORIDE 0.9 % IV SOLN
1000.0000 mg/m2 | Freq: Once | INTRAVENOUS | Status: AC
  Administered 2021-01-05: 2052 mg via INTRAVENOUS
  Filled 2021-01-05: qty 53.97

## 2021-01-05 NOTE — Progress Notes (Signed)
Timothy Page   [phone] [phone]   Clinic Follow up Note   Patient Care Team: [name], MD as PCP - General (Hematology and Oncology) Gatha Mayer, MD as Consulting Physician (Gastroenterology) [name], MD as Consulting Physician (Otolaryngology) Elayne Snare, MD as Consulting Physician (Endocrinology) Gaye Pollack, MD as Consulting Physician (Cardiothoracic Surgery) Lucas Mallow, MD as Consulting Physician (Urology) Debara Pickett Nadean Corwin, MD as Consulting Physician (Cardiology) [name], MD as Consulting Physician (Hematology and Oncology) Jonnie Finner, RN (Inactive) as Oncology Nurse Navigator  Date of Service:  01/05/2021  CHIEF COMPLAINT: f/u of pancreatic cancer  CURRENT THERAPY:  Second-line gemcitabine/abraxane, started 11/10/20  ASSESSMENT & PLAN:  Timothy Page is a 65 y.o. male with   1. 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   2. Cancer related pain -he uses oxycodone 2-3 times a week, also has morphine -Pain is overall controlled   3. Neuropathy due to chemotherapeutic drug ([place]), G2 -Secondary to oxaliplatin and Abraxane -Much worse lately, will continue to hold Abraxane today. 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 -He is also diabetic, no neuropathy before chemo -I prescribed gabapentin on 12/08/20. He endorses taking 300 mg at night. I increased his dose and refilled today.  4. Anxiety about health -He has been taking Ativan as needed for anxiety.     5. Poorly controlled diabetes, hypothyroidism -He is on insulin, blood glucose has been fluctuating  significantly with episodes of hypoglycemia, I encouraged him to follow-up with his endocrinologist    Plan -Due to worsening neuropathy, will continue to hold Abraxane  -proceed cycle 3-day 1 gemcitabine today -I called in gabapentin 300 mg pills, he will take 649m at night, and 3032min am, will increase dose gradually as tolerated -restaging CT to be done before next cycle  -lab, flush, f/u, and C4 gemcitabine as scheduled 01/19/21   No problem-specific Assessment & Plan notes found for this encounter.   SUMMARY OF ONCOLOGIC HISTORY: Oncology History  Adenocarcinoma of pancreas, stage 4 ([facility] 06/20/2020 Initial Diagnosis   Adenocarcinoma of pancreas, stage 4 ([facility]  07/01/2020 - 10/21/2020 Chemotherapy          07/01/2020 Genetic Testing   Negative genetic testing on a custom panel reviewing multicancer and pancreatitis genes.  The Custom-Gene Panel offered by [facility] includes sequencing and/or deletion duplication testing of the following 91 genes: AIP, ALK, APC*, ATM*, AXIN2, BAP1, BARD1, BLM, BMPR1A, BRCA1, BRCA2, BRIP1, CASR, CDC73, CDH1, CDK4, CDKN1B, CDKN1C, CDKN2A (p14ARF), CDKN2A (p16INK4a), CEBPA, CFTR*, CHEK2, CPA1, CTNNA1, CTRC, DICER1*, DIS3L2, EGFR, EPCAM*, FANCC, FH*, FLCN, GATA2, GPC3*, GREM1*, HOXB13, HRAS, KIT, MAX*, MEN1*, MET*, MITF*, MLH1*, MSH2*, MSH3*, MSH6*, MUTYH, NBN, NF1*, NF2, NTHL1, PALB2, PALLD, PDGFRA, PHOX2B*, PMS2*, POLD1*, POLE, POT1, PRKAR1A, PRSS1*, PTCH1, PTEN*, RAD50, RAD51C, RAD51D, RB1*, RECQL4*, RET, RUNX1, SDHA*, SDHAF2, SDHB, SDHC*, SDHD, SMAD4, SMARCA4, SMARCB1, SMARCE1, SPINK1, STK11, SUFU, TERC, TERT, TMEM127, TP53, TSC1*, TSC2, VHL, WRN*, and WT1.  The report date is Jul 01, 2020.    Imaging   Imaging after 4 cycles of therapy  IMPRESSION: 1. Pancreatic neck mass is stable. 2. No new  or progressive metastatic disease. Tiny soft tissue nodule along the posteroinferior right liver capsule is stable. Solitary liver metastasis adjacent to  the gallbladder is mildly decreased. Small bilateral pulmonary nodules are all stable. 3. New small nonocclusive thrombosis in the main portal vein. Persistent near occlusion of the proximal main portal vein with narrowing of the portal splenic venous confluence. 4. Aortic Atherosclerosis (ICD10-I70.0).   11/10/2020 -  Chemotherapy   Patient is on Treatment Plan : PANCREATIC Abraxane / Gemcitabine D1,8,15 q28d     He got one cycle of FOLFIRINOX on 07/01/2020 Following this, we changed the regimen to FOLFOX given toxicity. FOLFOX continued from 07/23/2020 through 10/06/2020 Oxali discontinued given neuropathy Started 5 FU on 10/21/2020 Imaging with two new pulmonary nodules, suspicious for progressive metastatic disease. New hypoattenuating lesion within the posterior right hepatic lobe, favoring metastatic disease Progressed on 1 st line chemotherapy ( He was only on 5 FU alone when he was noted to have progression) CT chest abdomen pelvis with contrast on 11/02/2020 showed 2 new pulmonary nodules suspicious for progressive metastatic disease, new hypoattenuating lesion within the posterior right hepatic lobe favoring metastatic disease.   Given areas of presumed focal steatosis adjacent to the gallbladder rounded focal steatosis is possible but felt less likely. He was then switched to second line gem Abraxane   INTERVAL HISTORY:  Timothy Page is here for a follow up of pancreatic cancer. He was last seen by me on 12/08/20. He presents to the clinic alone. He reports his neuropathy has worsened; he notes the numbness in his fingers is bad enough that he can't feel anything. He compares it to frostbite-- "when your hands are cold and they burn when you warm them up." He notes this did not happen after his last cycle or resolved on its own. He endorses using gabapentin (at night) with no relief. He also notes bothersome numbness in his toes, which has caused some issues with walking. He also reports  pain in the left ribcage area, especially when taking deep breaths. He notes it became very bad at night.   All other systems were reviewed with the patient and are negative.  MEDICAL HISTORY:  Past Medical History:  Diagnosis Date   Arthritis    Atypical chest pain 04/2017   ACS ruled out 05/11/17   CAP (community acquired pneumonia) 05/07/2017   Chronic constipation    at least partially d/t chronic opioids   Diabetes mellitus without complication ([place])    Poor control starting fall 2021-->GAD ab neg.  Insulin and C peptide levels wnl but on lower end of normal->Dr Kuma/endo 2022.   Exocrine pancreatic insufficiency 2022   History of thyroidectomy, total 2018   [place]url[place] cell adenoma   Hx of adenomatous polyp of colon 07/02/2009   rpt colonoscopy 2026   Hyperlipidemia    Intol of multiple statins and zetia. Advanced lipid clinic 2022.   Hypertension    Lipoma    back   Myocardial infarction [facility])    MI at age 2   Pancreatic adenocarcinoma ([place]) 06/2020   Mets to liver (?and lungs?->stage 4), w/acute pancreatitis.   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 Cammy Copa   Right ureteral calculus 04/2018   Cystoscopy with right retrograde pyelogram and right ureteral  stent placement after stone removal.   Seasonal allergies    Stroke ([place])    in 99H no complications    Tachyarrhythmia 1980   age 57 X 2 over 8 month period.  No recurrence since that time.   Thoracic aortic aneurysm    Incidentally discovered, 4.1 cm-->Dr. Cyndia Bent. Rpt CT 08/2018 and 08/2019 stable.   Vocal cord paralysis 07/2016   Left vocal cord paralysis noted after thyroid surgery.    SURGICAL HISTORY: Past Surgical History:  Procedure Laterality Date   BIOPSY  06/18/2020   Procedure: BIOPSY;   Surgeon: Mansouraty, Telford Nab., MD;  Location: [facility] ENDOSCOPY;  Service: Gastroenterology;;   [place] W/ POLYPECTOMY  07/02/2009; 01/24/20   2011 adenoma.  2021 adenomas x 3->recall 2026   CYSTOSCOPY WITH RETROGRADE PYELOGRAM, URETEROSCOPY AND STENT PLACEMENT Right 05/09/2018   Procedure: CYSTOSCOPY WITH RIGHT RETROGRADE RIGHT URETEROSCOPY STONE EXTRACTION RIGHT STENT EXCHANGE;  Surgeon: Lucas Mallow, MD;  Location: [facility] ORS;  Service: Urology;  Laterality: Right;   CYSTOSCOPY/RETROGRADE/URETEROSCOPY/STONE EXTRACTION WITH BASKET Right 04/30/2018   Procedure: CYSTOSCOPY/RETROGRADE/RIGHT URETEROSCOPY/;  Surgeon: Lucas Mallow, MD;  Location: [facility] ORS;  Service: Urology;  Laterality: Right;   ESOPHAGOGASTRODUODENOSCOPY  07/02/2009   NORMAL   ESOPHAGOGASTRODUODENOSCOPY (EGD) WITH PROPOFOL N/A 06/18/2020   Procedure: ESOPHAGOGASTRODUODENOSCOPY (EGD) WITH PROPOFOL;  Surgeon: Rush Landmark Telford Nab., MD;  Location: [place];  Service: Gastroenterology;  Laterality: N/A;   EYE SURGERY     FINE NEEDLE ASPIRATION  06/18/2020   Procedure: FINE NEEDLE ASPIRATION (FNA) LINEAR;  Surgeon: Irving Copas., MD;  Location: [place];  Service: Gastroenterology;;   finger amputaion     with reattachement   INCISION AND DRAINAGE ABSCESS ANAL  11/23/2019   gen surg (In ED).   IR IMAGING GUIDED PORT INSERTION  06/22/2020   LASIK     THYROIDECTOMY N/A 08/03/2016   Procedure: Total THYROIDECTOMY;  Surgeon: [name], MD;  Location: [place];  Service: ENT;  Laterality: N/A;  Total Thyroidectomy    UPPER ESOPHAGEAL ENDOSCOPIC ULTRASOUND (EUS) N/A 06/18/2020   Procedure: UPPER ESOPHAGEAL ENDOSCOPIC ULTRASOUND (EUS);  Surgeon: Irving Copas., MD;  Location: [place];  Service: Gastroenterology;  Laterality: N/A;   UPPER GASTROINTESTINAL ENDOSCOPY     with EUS and bx of pancreatic mass. +appearance of candida in esoph.      I have reviewed the social  history and family history with the patient and they are unchanged from previous note.  ALLERGIES:  is allergic to atenolol, hydroxyzine, tetracycline, atorvastatin, codeine, guaifenesin, and mucinex [guaifenesin er].  MEDICATIONS:  Current Outpatient Medications  Medication Sig Dispense Refill   gabapentin (NEURONTIN) 300 MG capsule Take 1-2 capsules (300-600 mg total) by mouth 3 (three) times daily. 120 capsule 1   apixaban (ELIQUIS) 5 MG TABS tablet TAKE 1 TABLET(5 MG) BY MOUTH TWICE DAILY 60 tablet 1   Continuous Blood Gluc Sensor (FREESTYLE LIBRE 2 SENSOR) MISC APPLY 1 SENSOR TO THE SKIN EVERY 14 DAYS AS DIRECTED 2 each 3   docusate sodium (COLACE) 100 MG capsule Take 100 mg by mouth 2 (two) times daily.     HUMALOG KWIKPEN 100 UNIT/ML KwikPen Inject 20-22 Units into the skin daily.     insulin glargine (LANTUS SOLOSTAR) 100 UNIT/ML Solostar Pen Give up to 10 units under the skin every evening at bedtime. 15 mL 1   Insulin Pen Needle 31G X 5 MM MISC Use with insulin pen 3 times a day  100 each 1   lactulose (CHRONULAC) 10 GM/15ML solution Take 15 mLs (10 g total) by mouth 3 (three) times daily as needed for mild constipation. 236 mL 0   levothyroxine (SYNTHROID) 137 MCG tablet TAKE 1 TABLET BY MOUTH  DAILY BEFORE BREAKFAST 90 tablet 3   lipase/protease/amylase (CREON) 36000 UNITS CPEP capsule Take 2 capsules (72,000 Units total) by mouth 3 (three) times daily with meals. May also take 1 capsule (36,000 Units total) as needed (with snacks). 240 capsule 11   LORazepam (ATIVAN) 0.5 MG tablet Take 1 tablet (0.5 mg total) by mouth every 12 (twelve) hours as needed for anxiety. 60 tablet 0   magic mouthwash SOLN SWISH WITH 30 MLS BY MOUTH FOUR TIMES DAILY AND SWALLOW. NO FOOD OR DRINK FOR 1 HOUR AFTERWARDS (Patient not taking: Reported on 12/22/2020)     magnesium hydroxide (MILK OF MAGNESIA) 400 MG/5ML suspension Take by mouth daily as needed for mild constipation.     omeprazole (PRILOSEC) 20 MG  capsule TAKE 1 CAPSULE(20 MG) BY MOUTH DAILY 30 capsule 0   oxyCODONE (OXY IR/ROXICODONE) 5 MG immediate release tablet Take 1 tablet (5 mg total) by mouth every 6 (six) hours as needed for severe pain or moderate pain. 60 tablet 0   polyethylene glycol (MIRALAX / GLYCOLAX) 17 g packet Take 17 g by mouth daily.     prochlorperazine (COMPAZINE) 10 MG tablet TAKE 1 TABLET(10 MG) BY MOUTH EVERY 6 HOURS AS NEEDED FOR NAUSEA OR VOMITING 30 tablet 1   ramipril (ALTACE) 10 MG capsule Take 10 mg by mouth 2 (two) times daily.     senna-docusate (SENOKOT-S) 8.6-50 MG tablet Take 1 tablet by mouth 2 (two) times daily.     No current facility-administered medications for this visit.   Facility-Administered Medications Ordered in Other Visits  Medication Dose Route Frequency Provider Last Rate Last Admin   LORazepam (ATIVAN) tablet 0.5 mg  0.5 mg Oral Once Truitt Merle, MD        PHYSICAL EXAMINATION: ECOG PERFORMANCE STATUS: 1 - Symptomatic but completely ambulatory  Vitals:   01/05/21 1052  BP: (!) 179/102  Pulse: 79  Resp: 17  Temp: 98.6 F (37 C)  SpO2: 98%   Wt Readings from Last 3 Encounters:  01/05/21 195 lb 6.4 oz (88.6 kg)  12/22/20 194 lb 7 oz (88.2 kg)  12/08/20 199 lb 11.2 oz (90.6 kg)     GENERAL:alert, no distress and comfortable SKIN: skin color normal, no rashes or significant lesions EYES: normal, Conjunctiva are pink and non-injected, sclera clear  NEURO: alert & oriented x 3 with fluent speech  LABORATORY DATA:  I have reviewed the data as listed CBC Latest Ref Rng & Units 01/05/2021 12/22/2020 12/08/2020  WBC 4.0 - 10.5 K/uL 3.8(L) 4.7 3.8(L)  Hemoglobin 13.0 - 17.0 g/dL 11.6(L) 11.9(L) 10.9(L)  Hematocrit 39.0 - 52.0 % 33.6(L) 33.5(L) 31.7(L)  Platelets 150 - 400 K/uL 126(L) 198 113(L)     CMP Latest Ref Rng & Units 01/05/2021 12/22/2020 12/08/2020  Glucose 70 - 99 mg/dL 301(H) 103(H) 267(H)  BUN 8 - 23 mg/dL '11 15 12  ' Creatinine 0.61 - 1.24 mg/dL 1.01 0.84 0.96   Sodium 135 - 145 mmol/L 136 140 140  Potassium 3.5 - 5.1 mmol/L 4.3 3.9 3.9  Chloride 98 - 111 mmol/L 105 109 109  CO2 22 - 32 mmol/L 24 23 21(L)  Calcium 8.9 - 10.3 mg/dL 8.9 9.0 8.8(L)  Total Protein 6.5 - 8.1 g/dL 6.9 6.9  6.4(L)  Total Bilirubin 0.3 - 1.2 mg/dL 0.6 0.6 0.5  Alkaline Phos 38 - 126 U/L 104 96 82  AST 15 - 41 U/L 12(L) 11(L) 12(L)  ALT 0 - 44 U/L '8 9 8      ' RADIOGRAPHIC STUDIES: I have personally reviewed the radiological images as listed and agreed with the findings in the report. No results found.    Orders Placed This Encounter  Procedures   CT CHEST ABDOMEN PELVIS W CONTRAST    Standing Status:   Future    Standing Expiration Date:   01/05/2022    Order Specific Question:   Preferred imaging location?    Answer:   [facility]    Order Specific Question:   Release to patient    Answer:   Immediate    Order Specific Question:   Is Oral Contrast requested for this exam?    Answer:   Yes, Per Radiology protocol   All questions were answered. The patient knows to call the clinic with any problems, questions or concerns. No barriers to learning was detected. The total time spent in the appointment was 30 minutes.     Truitt Merle, MD 01/05/2021   I, Wilburn Mylar, am acting as scribe for Truitt Merle, MD.   I have reviewed the above documentation for accuracy and completeness, and I agree with the above.

## 2021-01-06 ENCOUNTER — Encounter: Payer: Self-pay | Admitting: Hematology and Oncology

## 2021-01-06 LAB — CANCER ANTIGEN 19-9: CA 19-9: 36 U/mL — ABNORMAL HIGH (ref 0–35)

## 2021-01-11 ENCOUNTER — Other Ambulatory Visit: Payer: Self-pay | Admitting: Hematology

## 2021-01-11 NOTE — Telephone Encounter (Signed)
Reordered by Dr. Burr Medico on 11/28. Gardiner Rhyme, RN

## 2021-01-14 ENCOUNTER — Other Ambulatory Visit: Payer: Self-pay | Admitting: Hematology

## 2021-01-14 ENCOUNTER — Other Ambulatory Visit: Payer: Self-pay

## 2021-01-14 ENCOUNTER — Telehealth: Payer: Self-pay

## 2021-01-14 DIAGNOSIS — E1165 Type 2 diabetes mellitus with hyperglycemia: Secondary | ICD-10-CM

## 2021-01-14 DIAGNOSIS — Z794 Long term (current) use of insulin: Secondary | ICD-10-CM

## 2021-01-14 MED ORDER — LORAZEPAM 0.5 MG PO TABS: 0.5000 mg | ORAL_TABLET | Freq: Two times a day (BID) | ORAL | 0 refills | Status: DC | PRN

## 2021-01-14 MED ORDER — FREESTYLE LIBRE 2 SENSOR MISC: 3 refills | Status: AC

## 2021-01-14 NOTE — Telephone Encounter (Signed)
Pt's spouse called requesting refill for Lorazepam.  Notified Dr. Burr Medico of the pt's request for a refill.  Pt would like refill to be sent to his preferred pharmacy on file.

## 2021-01-15 ENCOUNTER — Other Ambulatory Visit: Payer: Self-pay

## 2021-01-15 ENCOUNTER — Telehealth: Payer: Self-pay

## 2021-01-15 DIAGNOSIS — F418 Other specified anxiety disorders: Secondary | ICD-10-CM

## 2021-01-15 NOTE — Telephone Encounter (Signed)
This nurse received a message from patients wife stating that she has been attempting to get a refill on patients Lorazepam.  Left a message and advised patient that refill was submitted to [facility] [address] on 01/14/2021.  No further concerns at this time.

## 2021-01-18 ENCOUNTER — Ambulatory Visit (HOSPITAL_COMMUNITY)
Admission: RE | Admit: 2021-01-18 | Discharge: 2021-01-18 | Disposition: A | Discharge status: Home / Self Care | Payer: 59 | Source: Ambulatory Visit | Attending: Hematology | Admitting: Hematology

## 2021-01-18 ENCOUNTER — Other Ambulatory Visit: Payer: Self-pay

## 2021-01-18 DIAGNOSIS — C259 Malignant neoplasm of pancreas, unspecified: Secondary | ICD-10-CM | POA: Insufficient documentation

## 2021-01-18 MED ORDER — IOHEXOL 350 MG/ML SOLN
80.0000 mL | Freq: Once | INTRAVENOUS | Status: AC | PRN
  Administered 2021-01-18: 80 mL via INTRAVENOUS

## 2021-01-18 MED ORDER — SODIUM CHLORIDE (PF) 0.9 % IJ SOLN
INTRAMUSCULAR | Status: AC
  Filled 2021-01-18: qty 50

## 2021-01-19 ENCOUNTER — Inpatient Hospital Stay: Payer: 59

## 2021-01-19 ENCOUNTER — Telehealth: Payer: Self-pay | Admitting: Pharmacist

## 2021-01-19 ENCOUNTER — Other Ambulatory Visit (HOSPITAL_COMMUNITY): Payer: Self-pay

## 2021-01-19 ENCOUNTER — Inpatient Hospital Stay: Payer: 59 | Attending: Genetic Counselor | Admitting: Hematology

## 2021-01-19 VITALS — BP 132/87

## 2021-01-19 VITALS — BP 149/98 | HR 77 | Temp 98.6°F | Resp 18 | Ht 69.0 in | Wt 193.4 lb

## 2021-01-19 DIAGNOSIS — Z5111 Encounter for antineoplastic chemotherapy: Secondary | ICD-10-CM | POA: Diagnosis present

## 2021-01-19 DIAGNOSIS — G620 Drug-induced polyneuropathy: Secondary | ICD-10-CM | POA: Insufficient documentation

## 2021-01-19 DIAGNOSIS — E039 Hypothyroidism, unspecified: Secondary | ICD-10-CM | POA: Insufficient documentation

## 2021-01-19 DIAGNOSIS — C259 Malignant neoplasm of pancreas, unspecified: Secondary | ICD-10-CM

## 2021-01-19 DIAGNOSIS — C7800 Secondary malignant neoplasm of unspecified lung: Secondary | ICD-10-CM | POA: Insufficient documentation

## 2021-01-19 DIAGNOSIS — C787 Secondary malignant neoplasm of liver and intrahepatic bile duct: Secondary | ICD-10-CM | POA: Diagnosis present

## 2021-01-19 DIAGNOSIS — G893 Neoplasm related pain (acute) (chronic): Secondary | ICD-10-CM | POA: Insufficient documentation

## 2021-01-19 DIAGNOSIS — E11649 Type 2 diabetes mellitus with hypoglycemia without coma: Secondary | ICD-10-CM | POA: Insufficient documentation

## 2021-01-19 DIAGNOSIS — Z95828 Presence of other vascular implants and grafts: Secondary | ICD-10-CM

## 2021-01-19 LAB — CMP (CANCER CENTER ONLY)
ALT: 9 U/L (ref 0–44)
AST: 14 U/L — ABNORMAL LOW (ref 15–41)
Albumin: 3.8 g/dL (ref 3.5–5.0)
Alkaline Phosphatase: 96 U/L (ref 38–126)
Anion gap: 9 (ref 5–15)
BUN: 11 mg/dL (ref 8–23)
CO2: 23 mmol/L (ref 22–32)
Calcium: 9 mg/dL (ref 8.9–10.3)
Chloride: 107 mmol/L (ref 98–111)
Creatinine: 0.93 mg/dL (ref 0.61–1.24)
GFR, Estimated: >60 mL/min (ref >60)
Glucose, Bld: 103 mg/dL — ABNORMAL HIGH (ref 70–99)
Potassium: 3.8 mmol/L (ref 3.5–5.1)
Sodium: 139 mmol/L (ref 135–145)
Total Bilirubin: 0.5 mg/dL (ref 0.3–1.2)
Total Protein: 6.7 g/dL (ref 6.5–8.1)

## 2021-01-19 LAB — CBC WITH DIFFERENTIAL/PLATELET
Abs Immature Granulocytes: 0 10*3/uL (ref 0.00–0.07)
Basophils Absolute: 0 10*3/uL (ref 0.0–0.1)
Basophils Relative: 1 %
Eosinophils Absolute: 0 10*3/uL (ref 0.0–0.5)
Eosinophils Relative: 1 %
HCT: 32.8 % — ABNORMAL LOW (ref 39.0–52.0)
Hemoglobin: 11.4 g/dL — ABNORMAL LOW (ref 13.0–17.0)
Immature Granulocytes: 0 %
Lymphocytes Relative: 24 %
Lymphs Abs: 1 10*3/uL (ref 0.7–4.0)
MCH: 32.5 pg (ref 26.0–34.0)
MCHC: 34.8 g/dL (ref 30.0–36.0)
MCV: 93.4 fL (ref 80.0–100.0)
Monocytes Absolute: 0.4 10*3/uL (ref 0.1–1.0)
Monocytes Relative: 10 %
Neutro Abs: 2.7 10*3/uL (ref 1.7–7.7)
Neutrophils Relative %: 64 %
Platelets: 149 10*3/uL — ABNORMAL LOW (ref 150–400)
RBC: 3.51 MIL/uL — ABNORMAL LOW (ref 4.22–5.81)
RDW: 12.5 % (ref 11.5–15.5)
WBC: 4.1 10*3/uL (ref 4.0–10.5)
nRBC: 0 % (ref 0.0–0.2)

## 2021-01-19 MED ORDER — CAPECITABINE 500 MG PO TABS
ORAL_TABLET | ORAL | 0 refills | Status: DC
  Filled 2021-01-19: qty 84, fill #0

## 2021-01-19 MED ORDER — HEPARIN SOD (PORK) LOCK FLUSH 100 UNIT/ML IV SOLN
500.0000 [IU] | Freq: Once | INTRAVENOUS | Status: AC | PRN
  Administered 2021-01-19: 500 [IU]

## 2021-01-19 MED ORDER — GABAPENTIN 100 MG PO CAPS: 100.0000 mg | ORAL_CAPSULE | Freq: Three times a day (TID) | ORAL | 0 refills | Status: DC

## 2021-01-19 MED ORDER — SODIUM CHLORIDE 0.9 % IV SOLN
1000.0000 mg/m2 | Freq: Once | INTRAVENOUS | Status: AC
  Administered 2021-01-19: 2052 mg via INTRAVENOUS
  Filled 2021-01-19: qty 53.97

## 2021-01-19 MED ORDER — CAPECITABINE 500 MG PO TABS
ORAL_TABLET | ORAL | 0 refills | Status: DC
Start: 2021-01-19 — End: 2021-02-09

## 2021-01-19 MED ORDER — SODIUM CHLORIDE 0.9% FLUSH
10.0000 mL | INTRAVENOUS | Status: DC | PRN
  Administered 2021-01-19: 10 mL

## 2021-01-19 MED ORDER — PROCHLORPERAZINE MALEATE 10 MG PO TABS
10.0000 mg | ORAL_TABLET | Freq: Once | ORAL | Status: AC
  Administered 2021-01-19: 10 mg via ORAL
  Filled 2021-01-19: qty 1

## 2021-01-19 MED ORDER — SODIUM CHLORIDE 0.9 % IV SOLN: Freq: Once | INTRAVENOUS | Status: AC

## 2021-01-19 MED ORDER — SODIUM CHLORIDE 0.9% FLUSH
10.0000 mL | Freq: Once | INTRAVENOUS | Status: AC
  Administered 2021-01-19: 10 mL

## 2021-01-19 NOTE — Progress Notes (Addendum)
[place]   [phone] [phone]   Clinic Follow up Note   Patient Care Team: [name], MD as PCP - General (Hematology and Oncology) Gatha Mayer, MD as Consulting Physician (Gastroenterology) [name], MD as Consulting Physician (Otolaryngology) Elayne Snare, MD as Consulting Physician (Endocrinology) Gaye Pollack, MD as Consulting Physician (Cardiothoracic Surgery) Lucas Mallow, MD as Consulting Physician (Urology) Debara Pickett Nadean Corwin, MD as Consulting Physician (Cardiology) [name], MD as Consulting Physician (Hematology and Oncology) Jonnie Finner, RN (Inactive) as Oncology Nurse Navigator  Date of Service:  01/19/2021  CHIEF COMPLAINT: f/u of pancreatic cancer  CURRENT THERAPY:  Second-line gemcitabine/abraxane, started 11/10/20 -Abraxane held after 12/08/20 dose d/t neuropathy -plan to add Xeloda from 12/19   ASSESSMENT & PLAN:  Timothy Page is a 65 y.o. male with   1. 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 I reviewed the results and images with them in person today. -He is tolerating gemcitabine, Mody well, still has moderate to severe abdominal pain for 2 days after infusion.  We will continue gemcitabine, due to the upcoming holiday, will do gemcitabine every 2 weeks for the next month, then changed back to day 1 and 8 every 21 days -I discussed that gemcitabine alone is not very effective, I recommend adding Xeloda to gemcitabine, this regimen has been used for pancreatic cancer in the adjuvant setting.  We will start him on 821m/m2 BID for 7 days on and 7 days off for first two cycles,  then change to day 1-14 every 21 days from cycle 3 if he tolerates well -f/u in 2 weeks    2. Cancer related pain -he uses oxycodone 2-3 times a week, also has morphine -Pain is overall controlled   3. 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 -He is also diabetic, no neuropathy before chemo -I prescribed gabapentin on 12/08/20. He endorses taking 600 mg at night and 100 mg in the AM. I refilled the 100 mg capsules today. -We discussed the role of physical therapy, he declined. He is very physically active at work, walks more than 10K steps a day    4. Anxiety about health -He has been taking Ativan as needed for anxiety.     5. Poorly controlled diabetes, hypothyroidism -He is on insulin, blood glucose has been fluctuating significantly with episodes of hypoglycemia, I encouraged him to follow-up with his endocrinologist     Plan -proceed cycle 4-day 1 gemcitabine today, next treatment in 2 weeks  -called in Xeloda [id number] for 7days on and 7 days off, starting 12/19  -I called in gabapentin 100 mg pills -lab, flush, and C5 gemcitabine as scheduled 02/02/21   No problem-specific Assessment & Plan notes found for this encounter.   SUMMARY OF ONCOLOGIC HISTORY: Oncology History  Adenocarcinoma of pancreas, stage 4 ([facility] 06/20/2020 Initial Diagnosis   Adenocarcinoma of pancreas, stage 4 ([facility]  07/01/2020 - 10/21/2020 Chemotherapy          07/01/2020 Genetic Testing  Negative genetic testing on a custom panel reviewing multicancer and pancreatitis genes.  The Custom-Gene Panel offered by [facility] includes sequencing and/or deletion duplication testing of the following 91 genes: AIP, ALK, APC*, ATM*, AXIN2, BAP1, BARD1, BLM, BMPR1A, BRCA1, BRCA2, BRIP1, CASR, CDC73, CDH1, CDK4, CDKN1B, CDKN1C, CDKN2A (p14ARF),  CDKN2A (p16INK4a), CEBPA, CFTR*, CHEK2, CPA1, CTNNA1, CTRC, DICER1*, DIS3L2, EGFR, EPCAM*, FANCC, FH*, FLCN, GATA2, GPC3*, GREM1*, HOXB13, HRAS, KIT, MAX*, MEN1*, MET*, MITF*, MLH1*, MSH2*, MSH3*, MSH6*, MUTYH, NBN, NF1*, NF2, NTHL1, PALB2, PALLD, PDGFRA, PHOX2B*, PMS2*, POLD1*, POLE, POT1, PRKAR1A, PRSS1*, PTCH1, PTEN*, RAD50, RAD51C, RAD51D, RB1*, RECQL4*, RET, RUNX1, SDHA*, SDHAF2, SDHB, SDHC*, SDHD, SMAD4, SMARCA4, SMARCB1, SMARCE1, SPINK1, STK11, SUFU, TERC, TERT, TMEM127, TP53, TSC1*, TSC2, VHL, WRN*, and WT1.  The report date is Jul 01, 2020.   08/26/2020 Imaging   Imaging after 4 cycles of therapy  IMPRESSION: 1. Pancreatic neck mass is stable. 2. No new or progressive metastatic disease. Tiny soft tissue nodule along the posteroinferior right liver capsule is stable. Solitary liver metastasis adjacent to the gallbladder is mildly decreased. Small bilateral pulmonary nodules are all stable. 3. New small nonocclusive thrombosis in the main portal vein. Persistent near occlusion of the proximal main portal vein with narrowing of the portal splenic venous confluence. 4. Aortic Atherosclerosis (ICD10-I70.0).   11/02/2020 Imaging   EXAM: CT CHEST, ABDOMEN, AND PELVIS WITH CONTRAST  IMPRESSION: 1. Two new pulmonary nodules, suspicious for progressive metastatic disease. Other pulmonary nodules are similar. Given somewhat ill-defined morphology of the posterior right upper lobe new nodule, recommend clinical exclusion of mild infection, felt less likely. 2. New hypoattenuating lesion within the posterior right hepatic lobe, favoring metastatic disease. Given areas of presumed focal steatosis adjacent the gallbladder, rounded focal steatosis is possible but felt less likely. If imaged differentiation is desired, pre and post-contrast MRI or PET would likely be definitive. 3. The pancreatic primary is slightly decreased and less well-defined today. 4. Otherwise, no new sites of metastatic  disease. 5.  Possible constipation. 6. Coronary artery atherosclerosis. Aortic Atherosclerosis (ICD10-I70.0).   11/10/2020 -  Chemotherapy   Patient is on Treatment Plan : PANCREATIC Abraxane / Gemcitabine D1,8,15 q28d     01/18/2021 Imaging   EXAM: CT CHEST, ABDOMEN, AND PELVIS WITH CONTRAST  IMPRESSION: 1. Today's study demonstrates positive response to therapy in terms of regression of numerous hypovascular hepatic lesions which likely represent hepatic metastases. 2. New pulmonary nodules seen on the prior study have regressed, favored to be indicative of benign infectious or inflammatory etiologies on the prior study. Old small pulmonary nodules are stable compared to the most recent prior examination, but new compared to more remote prior studies, likely to represent treated stable metastatic lesions. 3. The primary pancreatic mass continues to obstruct the main pancreatic duct, with worsening side branch ectasia throughout the body and tail of the pancreas and/or developing pancreatic pseudocysts, with evidence of pancreatitis in these regions, as above. 4. The primary pancreatic mass continues to cause severe narrowing of the superior mesenteric vein, splenoportal confluence and proximal portal vein (which is nearly completely occluded). Associated with this are portosystemic collateral pathways, most notable for large gastric varices, and mild splenomegaly. 5. Aortic atherosclerosis, in addition to left main and 2 vessel coronary artery disease. 6. Additional incidental findings, as above.      INTERVAL HISTORY:  Timothy Page is here for a follow up of pancreatic cancer. He was last seen by me on 01/05/21. He presents to the clinic accompanied by his wife,  Patty. He reports continued neuropathy-- he cannot pick up coins, struggles to grab papers, is dropping pencils, numbness. He reports his symptoms are worse in the afternoon. He does also report the tingling in his  feet will take him up at night. His wife has noticed some difficulty with his walking; he describes it as feeling like he's walking sideways. He also reports left-sided abdominal/flank pain. He reports the pain was intense over the weekend.   All other systems were reviewed with the patient and are negative.  MEDICAL HISTORY:  Past Medical History:  Diagnosis Date   Arthritis    Atypical chest pain 04/2017   ACS ruled out 05/11/17   CAP (community acquired pneumonia) 05/07/2017   Chronic constipation    at least partially d/t chronic opioids   Diabetes mellitus without complication ([place])    Poor control starting fall 2021-->GAD ab neg.  Insulin and C peptide levels wnl but on lower end of normal->Dr Kuma/endo 2022.   Exocrine pancreatic insufficiency 2022   History of thyroidectomy, total 2018   Hurlthe cell adenoma   Hx of adenomatous polyp of colon 07/02/2009   rpt colonoscopy 2026   Hyperlipidemia    Intol of multiple statins and zetia. Advanced lipid clinic 2022.   Hypertension    Lipoma    back   Myocardial infarction [facility])    MI at age 59   Pancreatic adenocarcinoma ([place]) 06/2020   Mets to liver (?and lungs?->stage 4), w/acute pancreatitis.   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 Cammy Copa   Right ureteral calculus 04/2018   Cystoscopy with right retrograde pyelogram and right ureteral stent placement after stone removal.   Seasonal allergies    Stroke ([place])    in 16X no complications    Tachyarrhythmia 1980   age 27 X 2 over 44 month period.  No recurrence since that time.   Thoracic aortic aneurysm    Incidentally discovered, 4.1 cm-->Dr. Cyndia Bent. Rpt CT 08/2018 and 08/2019 stable.   Vocal cord paralysis 07/2016   Left vocal cord paralysis noted  after thyroid surgery.    SURGICAL HISTORY: Past Surgical History:  Procedure Laterality Date   BIOPSY  06/18/2020   Procedure: BIOPSY;  Surgeon: Mansouraty, Telford Nab., MD;  Location[facility] ENDOSCOPY;  Service: Gastroenterology;;   [place] W/ POLYPECTOMY  07/02/2009; 01/24/20   2011 adenoma.  2021 adenomas x 3->recall 2026   CYSTOSCOPY WITH RETROGRADE PYELOGRAM, URETEROSCOPY AND STENT PLACEMENT Right 05/09/2018   Procedure: CYSTOSCOPY WITH RIGHT RETROGRADE RIGHT URETEROSCOPY STONE EXTRACTION RIGHT STENT EXCHANGE;  Surgeon: Lucas Mallow, MD;  Location: [facility] ORS;  Service: Urology;  Laterality: Right;   CYSTOSCOPY/RETROGRADE/URETEROSCOPY/STONE EXTRACTION WITH BASKET Right 04/30/2018   Procedure: CYSTOSCOPY/RETROGRADE/RIGHT URETEROSCOPY/;  Surgeon: Lucas Mallow, MD;  Location: [facility] ORS;  Service: Urology;  Laterality: Right;   ESOPHAGOGASTRODUODENOSCOPY  07/02/2009   NORMAL   ESOPHAGOGASTRODUODENOSCOPY (EGD) WITH PROPOFOL N/A 06/18/2020   Procedure: ESOPHAGOGASTRODUODENOSCOPY (EGD) WITH PROPOFOL;  Surgeon: Rush Landmark Telford Nab., MD;  Location: [place];  Service: Gastroenterology;  Laterality: N/A;   EYE SURGERY     FINE NEEDLE ASPIRATION  06/18/2020   Procedure: FINE NEEDLE ASPIRATION (FNA) LINEAR;  Surgeon: Irving Copas., MD;  Location: [place];  Service: Gastroenterology;;  finger amputaion     with reattachement   INCISION AND DRAINAGE ABSCESS ANAL  11/23/2019   gen surg (In ED).   IR IMAGING GUIDED PORT INSERTION  06/22/2020   LASIK     THYROIDECTOMY N/A 08/03/2016   Procedure: Total THYROIDECTOMY;  Surgeon: [name], MD;  Location: [place];  Service: ENT;  Laterality: N/A;  Total Thyroidectomy    UPPER ESOPHAGEAL ENDOSCOPIC ULTRASOUND (EUS) N/A 06/18/2020   Procedure: UPPER ESOPHAGEAL ENDOSCOPIC ULTRASOUND (EUS);  Surgeon: Irving Copas., MD;  Location: [place];  Service: Gastroenterology;  Laterality: N/A;   UPPER  GASTROINTESTINAL ENDOSCOPY     with EUS and bx of pancreatic mass. +appearance of candida in esoph.      I have reviewed the social history and family history with the patient and they are unchanged from previous note.  ALLERGIES:  is allergic to atenolol, hydroxyzine, tetracycline, atorvastatin, codeine, guaifenesin, and mucinex [guaifenesin er].  MEDICATIONS:  Current Outpatient Medications  Medication Sig Dispense Refill   apixaban (ELIQUIS) 5 MG TABS tablet TAKE 1 TABLET(5 MG) BY MOUTH TWICE DAILY 60 tablet 1   Continuous Blood Gluc Sensor (FREESTYLE LIBRE 2 SENSOR) MISC APPLY 1 SENSOR TO THE SKIN EVERY 14 DAYS AS DIRECTED 2 each 3   docusate sodium (COLACE) 100 MG capsule Take 100 mg by mouth 2 (two) times daily.     gabapentin (NEURONTIN) 300 MG capsule Take 1-2 capsules (300-600 mg total) by mouth 3 (three) times daily. 120 capsule 1   HUMALOG KWIKPEN 100 UNIT/ML KwikPen Inject 20-22 Units into the skin daily.     insulin glargine (LANTUS SOLOSTAR) 100 UNIT/ML Solostar Pen Give up to 10 units under the skin every evening at bedtime. 15 mL 1   Insulin Pen Needle 31G X 5 MM MISC Use with insulin pen 3 times a day 100 each 1   lactulose (CHRONULAC) 10 GM/15ML solution Take 15 mLs (10 g total) by mouth 3 (three) times daily as needed for mild constipation. 236 mL 0   levothyroxine (SYNTHROID) 137 MCG tablet TAKE 1 TABLET BY MOUTH  DAILY BEFORE BREAKFAST 90 tablet 3   lipase/protease/amylase (CREON) 36000 UNITS CPEP capsule Take 2 capsules (72,000 Units total) by mouth 3 (three) times daily with meals. May also take 1 capsule (36,000 Units total) as needed (with snacks). 240 capsule 11   LORazepam (ATIVAN) 0.5 MG tablet Take 1 tablet (0.5 mg total) by mouth every 12 (twelve) hours as needed for anxiety. 60 tablet 0   magic mouthwash SOLN SWISH WITH 30 MLS BY MOUTH FOUR TIMES DAILY AND SWALLOW. NO FOOD OR DRINK FOR 1 HOUR AFTERWARDS (Patient not taking: Reported on 12/22/2020)     magnesium  hydroxide (MILK OF MAGNESIA) 400 MG/5ML suspension Take by mouth daily as needed for mild constipation.     omeprazole (PRILOSEC) 20 MG capsule TAKE 1 CAPSULE(20 MG) BY MOUTH DAILY 30 capsule 0   oxyCODONE (OXY IR/ROXICODONE) 5 MG immediate release tablet Take 1 tablet (5 mg total) by mouth every 6 (six) hours as needed for severe pain or moderate pain. 60 tablet 0   polyethylene glycol (MIRALAX / GLYCOLAX) 17 g packet Take 17 g by mouth daily.     prochlorperazine (COMPAZINE) 10 MG tablet TAKE 1 TABLET(10 MG) BY MOUTH EVERY 6 HOURS AS NEEDED FOR NAUSEA OR VOMITING 30 tablet 1   ramipril (ALTACE) 10 MG capsule Take 10 mg by mouth 2 (two) times daily.     senna-docusate (SENOKOT-S) 8.6-50 MG tablet  Take 1 tablet by mouth 2 (two) times daily.     No current facility-administered medications for this visit.   Facility-Administered Medications Ordered in Other Visits  Medication Dose Route Frequency Provider Last Rate Last Admin   gemcitabine (GEMZAR) 2,052 mg in sodium chloride 0.9 % 250 mL chemo infusion  1,000 mg/m2 (Treatment Plan Recorded) Intravenous Once Truitt Merle, MD 608 mL/hr at 01/19/21 1431 2,052 mg at 01/19/21 1431   heparin lock flush 100 unit/mL  500 Units Intracatheter Once PRN Truitt Merle, MD       LORazepam (ATIVAN) tablet 0.5 mg  0.5 mg Oral Once Truitt Merle, MD       sodium chloride flush (NS) 0.9 % injection 10 mL  10 mL Intracatheter PRN Truitt Merle, MD        PHYSICAL EXAMINATION: ECOG PERFORMANCE STATUS: 1 - Symptomatic but completely ambulatory  Vitals:   01/19/21 1229  BP: (!) 149/98  Pulse: 77  Resp: 18  Temp: 98.6 F (37 C)  SpO2: 98%   Wt Readings from Last 3 Encounters:  01/19/21 193 lb 6.4 oz (87.7 kg)  01/05/21 195 lb 6.4 oz (88.6 kg)  12/22/20 194 lb 7 oz (88.2 kg)     GENERAL:alert, no distress and comfortable SKIN: skin color normal, no rashes or significant lesions EYES: normal, Conjunctiva are pink and non-injected, sclera clear  NEURO: alert &  oriented x 3 with fluent speech  LABORATORY DATA:  I have reviewed the data as listed CBC Latest Ref Rng & Units 01/19/2021 01/05/2021 12/22/2020  WBC 4.0 - 10.5 K/uL 4.1 3.8(L) 4.7  Hemoglobin 13.0 - 17.0 g/dL 11.4(L) 11.6(L) 11.9(L)  Hematocrit 39.0 - 52.0 % 32.8(L) 33.6(L) 33.5(L)  Platelets 150 - 400 K/uL 149(L) 126(L) 198     CMP Latest Ref Rng & Units 01/19/2021 01/05/2021 12/22/2020  Glucose 70 - 99 mg/dL 103(H) 301(H) 103(H)  BUN 8 - 23 mg/dL _0 Creatinine 0.61 - 1.24 mg/dL 0.93 1.01 0.84  Sodium 135 - 145 mmol/L 139 136 140  Potassium 3.5 - 5.1 mmol/L 3.8 4.3 3.9  Chloride 98 - 111 mmol/L 107 105 109  CO2 22 - 32 mmol/L _1 Calcium 8.9 - 10.3 mg/dL 9.0 8.9 9.0  Total Protein 6.5 - 8.1 g/dL 6.7 6.9 6.9  Total Bilirubin 0.3 - 1.2 mg/dL 0.5 0.6 0.6  Alkaline Phos 38 - 126 U/L 96 104 96  AST 15 - 41 U/L 14(L) 12(L) 11(L)  ALT 0 - 44 U/L _2 RADIOGRAPHIC STUDIES: I have personally reviewed the radiological images as listed and agreed with the findings in the report. CT CHEST ABDOMEN PELVIS W CONTRAST  Result Date: 01/19/2021 CLINICAL DATA:  65 year old male with history of pancreatic cancer. Evaluate for treatment response. EXAM: CT CHEST, ABDOMEN, AND PELVIS WITH CONTRAST TECHNIQUE: Multidetector CT imaging of the chest, abdomen and pelvis was performed following the standard protocol during bolus administration of intravenous contrast. CONTRAST:  5m OMNIPAQUE IOHEXOL 350 MG/ML SOLN COMPARISON:  CT the chest, abdomen and pelvis 11/02/2020. FINDINGS: CT CHEST FINDINGS Cardiovascular: Heart size is normal. There is no significant pericardial fluid, thickening or pericardial calcification. There is aortic atherosclerosis, as well as atherosclerosis of the great vessels of the mediastinum and the coronary arteries, including calcified atherosclerotic plaque in the left main, left anterior descending and right coronary arteries. Right internal jugular single-lumen  porta cath with tip terminating in the right atrium. Mediastinum/Nodes: No pathologically enlarged  mediastinal or hilar lymph nodes. Esophagus is unremarkable in appearance. No axillary lymphadenopathy. Lungs/Pleura: Previously noted ill-defined nodule of concern in the posterior aspect of the right upper lobe has completely resolved, indicative of a benign infectious or inflammatory etiology on the prior study. Previously noted 5 mm right middle lobe pulmonary nodule has also resolved. Two left lower lobe pulmonary nodules are again noted on axial image 75 of series 12, largest of which measures 6 mm, stable, but new when compared to more remote prior studies. No other new suspicious appearing pulmonary nodules or masses are noted. No acute consolidative airspace disease. No pleural effusions. Musculoskeletal: There are no aggressive appearing lytic or blastic lesions noted in the visualized portions of the skeleton. CT ABDOMEN PELVIS FINDINGS Hepatobiliary: Previously noted new hypovascular lesion in segment 5 of the liver has decreased slightly in size compared to the prior study (axial image 163 of series 11), currently measuring 2.1 x 1.9 cm (previously 3.1 x 2.5 cm), suggesting a partial response to therapy for a metastatic lesion. Other hypovascular areas adjacent to the gallbladder fossa seen on the prior study are also less pronounced on today's examination, also favored to reflect treated metastatic lesions. No definite new hepatic lesions are noted on today's examination. There continues to be a vague area of arterial phase hyperenhancement in segment 4B of the liver (axial image 33 of series 2), stable compared to prior examinations, likely a benign perfusion anomaly. No intra or extrahepatic biliary ductal dilatation. Gallbladder is normal in appearance. Pancreas: Again noted is a poorly defined hypovascular mass in the head of the pancreas (axial image 39 of series 2 and coronal image 39 of series 3),  estimated to measure approximately 2.2 x 2.1 x 2.1 cm on today's study. This is intimately associated with the superior mesenteric vein, splenoportal confluence and proximal portal vein, all of which appear markedly narrowed as they traverse adjacent to the lesion. There is a well-defined fat plane between the lesion and the adjacent superior mesenteric artery. Common hepatic artery also appears separate from the lesion. This lesion continues to obstruct the main pancreatic duct which is markedly dilated throughout the body and tail of the pancreas, measuring up to 1.4 cm in diameter. New areas of low attenuation are noted throughout the distal body and tail of the pancreas, favored to represent either developing pancreatic pseudocysts or areas of increasing side branch ectasia, largest of which is noted in the region of the junction of distal body and tail (axial image 37 of series 2) measuring 1.7 x 1.1 cm. Soft tissue stranding is noted adjacent to the distal body and tail of the pancreas, suggesting probable acute pancreatitis. Spleen: Spleen is enlarged measuring 12.5 x 5.6 x 13.8 cm (estimated splenic volume of 483 mL) . Adrenals/Urinary Tract: Bilateral kidneys and adrenal glands are normal in appearance. No hydroureteronephrosis. Urinary bladder is normal in appearance. Stomach/Bowel: Normal appearance of the stomach. No pathologic dilatation of small bowel or colon. Normal appendix. Vascular/Lymphatic: Aortic atherosclerosis, without evidence of aneurysm or dissection in the abdominal or pelvic vasculature. Vascular findings pertinent to pancreatic neoplasm, as detailed above. Multiple portosystemic collateral pathways are noted, most evident as gastric varices. No lymphadenopathy noted in the abdomen or pelvis. Reproductive: Prostate gland and seminal vesicles are unremarkable in appearance. Other: No significant volume of ascites.  No pneumoperitoneum. Musculoskeletal: There are no aggressive appearing  lytic or blastic lesions noted in the visualized portions of the skeleton. IMPRESSION: 1. Today's study demonstrates positive response to  therapy in terms of regression of numerous hypovascular hepatic lesions which likely represent hepatic metastases. 2. New pulmonary nodules seen on the prior study have regressed, favored to be indicative of benign infectious or inflammatory etiologies on the prior study. Old small pulmonary nodules are stable compared to the most recent prior examination, but new compared to more remote prior studies, likely to represent treated stable metastatic lesions. 3. The primary pancreatic mass continues to obstruct the main pancreatic duct, with worsening side branch ectasia throughout the body and tail of the pancreas and/or developing pancreatic pseudocysts, with evidence of pancreatitis in these regions, as above. 4. The primary pancreatic mass continues to cause severe narrowing of the superior mesenteric vein, splenoportal confluence and proximal portal vein (which is nearly completely occluded). Associated with this are portosystemic collateral pathways, most notable for large gastric varices, and mild splenomegaly. 5. Aortic atherosclerosis, in addition to left main and 2 vessel coronary artery disease. 6. Additional incidental findings, as above. Electronically Signed   By: Vinnie Langton M.D.   On: 01/19/2021 09:24      No orders of the defined types were placed in this encounter.  All questions were answered. The patient knows to call the clinic with any problems, questions or concerns. No barriers to learning was detected. The total time spent in the appointment was 40 minutes.     Truitt Merle, MD 01/19/2021   I, Wilburn Mylar, am acting as scribe for Truitt Merle, MD.   I have reviewed the above documentation for accuracy and completeness, and I agree with the above.

## 2021-01-19 NOTE — Patient Instructions (Signed)
[facility]  Discharge Instructions: Thank you for choosing [facility] to provide your oncology and hematology care.   If you have a lab appointment with the Cancer Center, please go directly to the Cancer Center and check in at the registration area.   Wear comfortable clothing and clothing appropriate for easy access to any Portacath or PICC line.   We strive to give you quality time with your provider. You may need to reschedule your appointment if you arrive late (15 or more minutes).  Arriving late affects you and other patients whose appointments are after yours.  Also, if you miss three or more appointments without notifying the office, you may be dismissed from the clinic at the provider's discretion.      For prescription refill requests, have your pharmacy contact our office and allow 72 hours for refills to be completed.    Today you received the following chemotherapy and/or immunotherapy agents Gemzar      To help prevent nausea and vomiting after your treatment, we encourage you to take your nausea medication as directed.  BELOW ARE SYMPTOMS THAT SHOULD BE REPORTED IMMEDIATELY: *FEVER GREATER THAN 100.4 F (38 C) OR HIGHER *CHILLS OR SWEATING *NAUSEA AND VOMITING THAT IS NOT CONTROLLED WITH YOUR NAUSEA MEDICATION *UNUSUAL SHORTNESS OF BREATH *UNUSUAL BRUISING OR BLEEDING *URINARY PROBLEMS (pain or burning when urinating, or frequent urination) *BOWEL PROBLEMS (unusual diarrhea, constipation, pain near the anus) TENDERNESS IN MOUTH AND THROAT WITH OR WITHOUT PRESENCE OF ULCERS (sore throat, sores in mouth, or a toothache) UNUSUAL RASH, SWELLING OR PAIN  UNUSUAL VAGINAL DISCHARGE OR ITCHING   Items with * indicate a potential emergency and should be followed up as soon as possible or go to the Emergency Department if any problems should occur.  Please show the CHEMOTHERAPY ALERT CARD or IMMUNOTHERAPY ALERT CARD at check-in to the  Emergency Department and triage nurse.  Should you have questions after your visit or need to cancel or reschedule your appointment, please contact [facility]  Dept: [phone]  and follow the prompts.  Office hours are 8:00 a.m. to 4:30 p.m. Monday - Friday. Please note that voicemails left after 4:00 p.m. may not be returned until the following business day.  We are closed weekends and major holidays. You have access to a nurse at all times for urgent questions. Please call the main number to the clinic Dept: [phone] and follow the prompts.   For any non-urgent questions, you may also contact your provider using MyChart. We now offer e-Visits for anyone 18 and older to request care online for non-urgent symptoms. For details visit [email or web address].   Also download the MyChart app! Go to the app store, search "MyChart", open the app, select [facility], and log in with your MyChart username and password.  Due to Covid, a mask is required upon entering the hospital/clinic. If you do not have a mask, one will be given to you upon arrival. For doctor visits, patients may have 1 support person aged 18 or older with them. For treatment visits, patients cannot have anyone with them due to current Covid guidelines and our immunocompromised population.   

## 2021-01-19 NOTE — Telephone Encounter (Addendum)
Oral Oncology Pharmacist Encounter  Received new prescription for Xeloda (capecitabine) for the treatment of metastatic pancreatic cancer in conjunction with gemcitabine, planned duration until disease progression or unacceptable drug toxicity.  CMP and CBC w/ Diff from 01/19/21 assessed, noted pltc of 149 K/uL. Prescription dose and frequency assessed for appropriateness. Per MD patient will be started on Xeloda for 7 days on/7 off for first 2 cycles before switching to 14 days on/7 days off pending patient toleration. Per MD note plan for patient to start on 02/01/21.   Current medication list in Epic reviewed, DDIs with Xeloda identified: Category C DDI between Xeloda and Omeprazole - proton-pump inhibitors can decrease efficacy of Xeloda - will discuss with patient alternatives to omeprazole, such as H2RA's like famotidine while on Xeloda.  Evaluated chart and no patient barriers to medication adherence noted.   Patient agreement for treatment documented in MD note on 01/19/21.  PA not required per patient's insurance at this time. Patient is required to fill through [facility]. Will redirect prescription for dispensing.   Oral Oncology Clinic will continue to follow for copayment issues, initial counseling and start date.  Leron Croak, PharmD, BCPS Hematology/Oncology Clinical Pharmacist [facility] [place] [phone] 01/19/2021 3:21 PM

## 2021-01-20 ENCOUNTER — Telehealth: Payer: Self-pay | Admitting: Hematology

## 2021-01-20 LAB — CANCER ANTIGEN 19-9: CA 19-9: 51 U/mL — ABNORMAL HIGH (ref 0–35)

## 2021-01-20 NOTE — Telephone Encounter (Signed)
Left message with follow-up appointments per 1/26 los. °

## 2021-01-21 ENCOUNTER — Encounter: Payer: Self-pay | Admitting: Hematology

## 2021-01-21 NOTE — Telephone Encounter (Signed)
Oral Chemotherapy Pharmacist Encounter   Attempted to reach patient to provide update and offer for initial counseling on oral medication: Xeloda (capecitabine).   No answer. Left voicemail for patient to call back to discuss details of medication acquisition and initial counseling session.  Leron Croak, PharmD, BCPS Hematology/Oncology Clinical Pharmacist [facility] [place] [phone] 01/21/2021 10:22 AM

## 2021-01-22 NOTE — Telephone Encounter (Signed)
Oral Chemotherapy Pharmacist Encounter  I spoke with patient's wife for overview of: Xeloda (capecitabine) for the treatment of metastatic pancreatic cancer in conjunction with gemcitabine, planned duration until disease progression or unacceptable drug toxicity.  Counseled on administration, dosing, side effects, monitoring, drug-food interactions, safe handling, storage, and disposal.  Patient will take Xeloda 500mg  tablets, 3 tablets (1500mg ) by mouth in AM and 3 tabs (1500mg ) by mouth in PM, within 30 minutes of finishing meals, for 7 days on, 7 days off, repeated every 14 days. Dosing frequency may be changed with subsequent cycles if first 2 cycles are tolerated well.   Xeloda start date: 02/01/21  Adverse effects include but are not limited to: fatigue, decreased blood counts, GI upset, diarrhea, mouth sores, and hand-foot syndrome.  Patient has anti-emetic on hand and knows to take it if nausea develops.   Patient will obtain anti diarrheal and alert the office of 4 or more loose stools above baseline.  Reviewed importance of keeping a medication schedule and plan for any missed doses. No barriers to medication adherence identified.  Medication reconciliation performed and medication/allergy list updated. Discussed drug-drug interaction between Xeloda and omeprazole and risk of decreased efficacy of Xeloda when used concomitantly together. Patient will switch to famotidine while on Xeloda.   Medication is required to be filled through [facility] [employer] This was delivered to patient's home on 01/22/21.  All questions answered.  Ms. Teegarden voiced understanding and appreciation.   Medication education handout and medication calendar placed in mail for patient. Patient's wife knows to call the office with questions or concerns. Oral Chemotherapy Clinic phone number provided.   Leron Croak, PharmD, BCPS Hematology/Oncology Clinical Pharmacist [facility] [place] [phone] 01/22/2021 1:52 PM

## 2021-01-25 ENCOUNTER — Other Ambulatory Visit: Payer: Self-pay

## 2021-01-25 ENCOUNTER — Other Ambulatory Visit (HOSPITAL_COMMUNITY): Payer: Self-pay

## 2021-01-25 MED ORDER — MAGIC MOUTHWASH W/LIDOCAINE
5.0000 mL | Freq: Four times a day (QID) | ORAL | 1 refills | Status: AC | PRN
  Filled 2021-01-25: qty 280, 14d supply, fill #0

## 2021-01-25 MED ORDER — NYSTATIN 100000 UNIT/ML MT SUSP
OROMUCOSAL | 2 refills | Status: DC
  Filled 2021-01-25: qty 280, 14d supply, fill #0

## 2021-01-25 NOTE — Progress Notes (Signed)
Called in prescription for Magic Mouthwash per Dr. Ernestina Penna verbal order to the [facility].  Order given to Aaron Edelman in the [facility].

## 2021-01-30 ENCOUNTER — Other Ambulatory Visit (HOSPITAL_COMMUNITY): Payer: Self-pay

## 2021-02-01 ENCOUNTER — Other Ambulatory Visit: Payer: Self-pay | Admitting: Hematology

## 2021-02-01 DIAGNOSIS — C259 Malignant neoplasm of pancreas, unspecified: Secondary | ICD-10-CM

## 2021-02-02 ENCOUNTER — Inpatient Hospital Stay (HOSPITAL_BASED_OUTPATIENT_CLINIC_OR_DEPARTMENT_OTHER): Payer: 59 | Admitting: Hematology

## 2021-02-02 ENCOUNTER — Inpatient Hospital Stay: Payer: 59

## 2021-02-02 ENCOUNTER — Other Ambulatory Visit: Payer: Self-pay

## 2021-02-02 ENCOUNTER — Other Ambulatory Visit (HOSPITAL_COMMUNITY): Payer: Self-pay

## 2021-02-02 VITALS — BP 140/89 | HR 92 | Temp 98.1°F | Resp 18 | Wt 181.2 lb

## 2021-02-02 DIAGNOSIS — C259 Malignant neoplasm of pancreas, unspecified: Secondary | ICD-10-CM

## 2021-02-02 DIAGNOSIS — Z95828 Presence of other vascular implants and grafts: Secondary | ICD-10-CM

## 2021-02-02 DIAGNOSIS — Z5111 Encounter for antineoplastic chemotherapy: Secondary | ICD-10-CM | POA: Diagnosis not present

## 2021-02-02 LAB — CBC WITH DIFFERENTIAL/PLATELET
Abs Immature Granulocytes: 0.01 10*3/uL (ref 0.00–0.07)
Basophils Absolute: 0 10*3/uL (ref 0.0–0.1)
Basophils Relative: 1 %
Eosinophils Absolute: 0 10*3/uL (ref 0.0–0.5)
Eosinophils Relative: 1 %
HCT: 35 % — ABNORMAL LOW (ref 39.0–52.0)
Hemoglobin: 12.3 g/dL — ABNORMAL LOW (ref 13.0–17.0)
Immature Granulocytes: 0 %
Lymphocytes Relative: 23 %
Lymphs Abs: 1.2 10*3/uL (ref 0.7–4.0)
MCH: 32.5 pg (ref 26.0–34.0)
MCHC: 35.1 g/dL (ref 30.0–36.0)
MCV: 92.3 fL (ref 80.0–100.0)
Monocytes Absolute: 0.6 10*3/uL (ref 0.1–1.0)
Monocytes Relative: 12 %
Neutro Abs: 3.2 10*3/uL (ref 1.7–7.7)
Neutrophils Relative %: 63 %
Platelets: 172 10*3/uL (ref 150–400)
RBC: 3.79 MIL/uL — ABNORMAL LOW (ref 4.22–5.81)
RDW: 12.3 % (ref 11.5–15.5)
WBC: 5 10*3/uL (ref 4.0–10.5)
nRBC: 0 % (ref 0.0–0.2)

## 2021-02-02 LAB — CMP (CANCER CENTER ONLY)
ALT: 11 U/L (ref 0–44)
AST: 14 U/L — ABNORMAL LOW (ref 15–41)
Albumin: 4.2 g/dL (ref 3.5–5.0)
Alkaline Phosphatase: 94 U/L (ref 38–126)
Anion gap: 10 (ref 5–15)
BUN: 13 mg/dL (ref 8–23)
CO2: 24 mmol/L (ref 22–32)
Calcium: 9.6 mg/dL (ref 8.9–10.3)
Chloride: 105 mmol/L (ref 98–111)
Creatinine: 1.06 mg/dL (ref 0.61–1.24)
GFR, Estimated: >60 mL/min (ref >60)
Glucose, Bld: 134 mg/dL — ABNORMAL HIGH (ref 70–99)
Potassium: 3.9 mmol/L (ref 3.5–5.1)
Sodium: 139 mmol/L (ref 135–145)
Total Bilirubin: 0.6 mg/dL (ref 0.3–1.2)
Total Protein: 7.2 g/dL (ref 6.5–8.1)

## 2021-02-02 MED ORDER — SODIUM CHLORIDE 0.9 % IV SOLN
1000.0000 mg/m2 | Freq: Once | INTRAVENOUS | Status: AC
  Administered 2021-02-02: 15:00:00 2052 mg via INTRAVENOUS
  Filled 2021-02-02: qty 53.97

## 2021-02-02 MED ORDER — SODIUM CHLORIDE 0.9% FLUSH
10.0000 mL | Freq: Once | INTRAVENOUS | Status: AC
  Administered 2021-02-02: 12:00:00 10 mL

## 2021-02-02 MED ORDER — SODIUM CHLORIDE 0.9% FLUSH
10.0000 mL | INTRAVENOUS | Status: DC | PRN
  Administered 2021-02-02: 15:00:00 10 mL

## 2021-02-02 MED ORDER — SODIUM CHLORIDE 0.9 % IV SOLN: Freq: Once | INTRAVENOUS | Status: AC

## 2021-02-02 MED ORDER — HEPARIN SOD (PORK) LOCK FLUSH 100 UNIT/ML IV SOLN
500.0000 [IU] | Freq: Once | INTRAVENOUS | Status: AC | PRN
  Administered 2021-02-02: 15:00:00 500 [IU]

## 2021-02-02 MED ORDER — PROCHLORPERAZINE MALEATE 10 MG PO TABS: 10.0000 mg | ORAL_TABLET | Freq: Once | ORAL | Status: DC

## 2021-02-02 NOTE — Patient Instructions (Addendum)
[facility]  Discharge Instructions: Thank you for choosing [facility] to provide your oncology and hematology care.   If you have a lab appointment with the Cancer Center, please go directly to the Cancer Center and check in at the registration area.   Wear comfortable clothing and clothing appropriate for easy access to any Portacath or PICC line.   We strive to give you quality time with your provider. You may need to reschedule your appointment if you arrive late (15 or more minutes).  Arriving late affects you and other patients whose appointments are after yours.  Also, if you miss three or more appointments without notifying the office, you may be dismissed from the clinic at the provider's discretion.      For prescription refill requests, have your pharmacy contact our office and allow 72 hours for refills to be completed.    Today you received the following chemotherapy and/or immunotherapy agents gemzar      To help prevent nausea and vomiting after your treatment, we encourage you to take your nausea medication as directed.  BELOW ARE SYMPTOMS THAT SHOULD BE REPORTED IMMEDIATELY: *FEVER GREATER THAN 100.4 F (38 C) OR HIGHER *CHILLS OR SWEATING *NAUSEA AND VOMITING THAT IS NOT CONTROLLED WITH YOUR NAUSEA MEDICATION *UNUSUAL SHORTNESS OF BREATH *UNUSUAL BRUISING OR BLEEDING *URINARY PROBLEMS (pain or burning when urinating, or frequent urination) *BOWEL PROBLEMS (unusual diarrhea, constipation, pain near the anus) TENDERNESS IN MOUTH AND THROAT WITH OR WITHOUT PRESENCE OF ULCERS (sore throat, sores in mouth, or a toothache) UNUSUAL RASH, SWELLING OR PAIN  UNUSUAL VAGINAL DISCHARGE OR ITCHING   Items with * indicate a potential emergency and should be followed up as soon as possible or go to the Emergency Department if any problems should occur.  Please show the CHEMOTHERAPY ALERT CARD or IMMUNOTHERAPY ALERT CARD at check-in to the  Emergency Department and triage nurse.  Should you have questions after your visit or need to cancel or reschedule your appointment, please contact [facility]  Dept: [phone]  and follow the prompts.  Office hours are 8:00 a.m. to 4:30 p.m. Monday - Friday. Please note that voicemails left after 4:00 p.m. may not be returned until the following business day.  We are closed weekends and major holidays. You have access to a nurse at all times for urgent questions. Please call the main number to the clinic Dept: [phone] and follow the prompts.   For any non-urgent questions, you may also contact your provider using MyChart. We now offer e-Visits for anyone 18 and older to request care online for non-urgent symptoms. For details visit [email or web address].   Also download the MyChart app! Go to the app store, search "MyChart", open the app, select [facility], and log in with your MyChart username and password.  Due to Covid, a mask is required upon entering the hospital/clinic. If you do not have a mask, one will be given to you upon arrival. For doctor visits, patients may have 1 support person aged 18 or older with them. For treatment visits, patients cannot have anyone with them due to current Covid guidelines and our immunocompromised population.   

## 2021-02-02 NOTE — Progress Notes (Signed)
[place]   [phone] [phone]   Clinic Follow up Note   Patient Care Team: [name], MD as PCP - General (Hematology and Oncology) Gatha Mayer, MD as Consulting Physician (Gastroenterology) [name], MD as Consulting Physician (Otolaryngology) Elayne Snare, MD as Consulting Physician (Endocrinology) Gaye Pollack, MD as Consulting Physician (Cardiothoracic Surgery) Lucas Mallow, MD as Consulting Physician (Urology) Debara Pickett Nadean Corwin, MD as Consulting Physician (Cardiology) [name], MD as Consulting Physician (Hematology and Oncology) Jonnie Finner, RN (Inactive) as Oncology Nurse Navigator  Date of Service:  02/03/2021  CHIEF COMPLAINT: f/u of pancreatic cancer  CURRENT THERAPY:  Second-line gemcitabine/abraxane, started 11/10/20 -Abraxane held after 12/08/20 dose d/t neuropathy -plan to add Xeloda from 12/19   ASSESSMENT & PLAN:  Timothy Page is a 65 y.o. male with   1. 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  -He is tolerating gemcitabine moderately well, still has moderate to severe abdominal pain for 2 days after infusion.  We will continue gemcitabine, due to the upcoming holiday, will do gemcitabine every 2 weeks for the next month, then changed back to day 1 and 8 every 21 days -I discussed that gemcitabine alone is not very effective, I recommend adding Xeloda to gemcitabine, this regimen has been used for pancreatic cancer in the adjuvant setting.  We will start him on 826m/m2 BID for 7 days on and 7 days off for first two cycles, then change to day 1-14 every 21 days from cycle 3 if  he tolerates well -he began Xeloda yesterday, 12/19, and developed epigastric/abdominal pain. On physical exam, he has tenderness to the left abdomen today. He notes he was told to discontinue his prilosec due to possible interaction with Xeloda. Prilosec has been shown to decrease the efficacy of Xeloda. However due to his significant gastric pain I recommend him to restart Prilosec. Will hold Xeloda for today and tomorrow, if pain resolves, will restart at lower dose 10069mbid to complete one week treatment and see if he tolerates better. -f/u in 2 week    2. Cancer related pain -he uses oxycodone 2-3 times a week, also has morphine -Pain is overall controlled, except for recent epigastric/abdominal pain.    3. 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 -He is also diabetic, no neuropathy before chemo -I prescribed gabapentin on 12/08/20. He endorses taking 600 mg at night and 100 mg in the AM.  -We discussed the role of physical therapy, he declined. He is very physically active at work, walks more than 10K steps a day    4. Anxiety about health -He has been taking Ativan as needed for anxiety.     5. Poorly controlled diabetes, hypothyroidism -He is on insulin, blood glucose has been fluctuating significantly with episodes of hypoglycemia, I encouraged him to follow-up with his endocrinologist     Plan -proceed cycle 5-day 1 gemcitabine today, next treatment in 2 weeks  -hold Xeloda for today and tomorrow, then restart at reduced dose 100067mid  until 12/25 then off for one week  -lab, flush, and C6 gemcitabine as scheduled 02/17/21, he will f/u with Dr. Chryl Heck on 1/4    No problem-specific Assessment & Plan notes found for this encounter.   SUMMARY OF ONCOLOGIC HISTORY: Oncology History  Adenocarcinoma of pancreas,  stage 4 ([place])  06/20/2020 Initial Diagnosis   Adenocarcinoma of pancreas, stage 4 ([place])   07/01/2020 - 10/21/2020 Chemotherapy          07/01/2020 Genetic Testing   Negative genetic testing on a custom panel reviewing multicancer and pancreatitis genes.  The Custom-Gene Panel offered by [facility] includes sequencing and/or deletion duplication testing of the following 91 genes: AIP, ALK, APC*, ATM*, AXIN2, BAP1, BARD1, BLM, BMPR1A, BRCA1, BRCA2, BRIP1, CASR, CDC73, CDH1, CDK4, CDKN1B, CDKN1C, CDKN2A (p14ARF), CDKN2A (p16INK4a), CEBPA, CFTR*, CHEK2, CPA1, CTNNA1, CTRC, DICER1*, DIS3L2, EGFR, EPCAM*, FANCC, FH*, FLCN, GATA2, GPC3*, GREM1*, HOXB13, HRAS, KIT, MAX*, MEN1*, MET*, MITF*, MLH1*, MSH2*, MSH3*, MSH6*, MUTYH, NBN, NF1*, NF2, NTHL1, PALB2, PALLD, PDGFRA, PHOX2B*, PMS2*, POLD1*, POLE, POT1, PRKAR1A, PRSS1*, PTCH1, PTEN*, RAD50, RAD51C, RAD51D, RB1*, RECQL4*, RET, RUNX1, SDHA*, SDHAF2, SDHB, SDHC*, SDHD, SMAD4, SMARCA4, SMARCB1, SMARCE1, SPINK1, STK11, SUFU, TERC, TERT, TMEM127, TP53, TSC1*, TSC2, VHL, WRN*, and WT1.  The report date is Jul 01, 2020.   08/26/2020 Imaging   Imaging after 4 cycles of therapy  IMPRESSION: 1. Pancreatic neck mass is stable. 2. No new or progressive metastatic disease. Tiny soft tissue nodule along the posteroinferior right liver capsule is stable. Solitary liver metastasis adjacent to the gallbladder is mildly decreased. Small bilateral pulmonary nodules are all stable. 3. New small nonocclusive thrombosis in the main portal vein. Persistent near occlusion of the proximal main portal vein with narrowing of the portal splenic venous confluence. 4. Aortic Atherosclerosis (ICD10-I70.0).   11/02/2020 Imaging   EXAM: CT CHEST, ABDOMEN, AND PELVIS WITH CONTRAST  IMPRESSION: 1. Two new pulmonary nodules, suspicious for progressive metastatic disease. Other pulmonary nodules are similar. Given somewhat ill-defined morphology of the posterior right upper lobe new nodule,  recommend clinical exclusion of mild infection, felt less likely. 2. New hypoattenuating lesion within the posterior right hepatic lobe, favoring metastatic disease. Given areas of presumed focal steatosis adjacent the gallbladder, rounded focal steatosis is possible but felt less likely. If imaged differentiation is desired, pre and post-contrast MRI or PET would likely be definitive. 3. The pancreatic primary is slightly decreased and less well-defined today. 4. Otherwise, no new sites of metastatic disease. 5.  Possible constipation. 6. Coronary artery atherosclerosis. Aortic Atherosclerosis (ICD10-I70.0).   11/10/2020 -  Chemotherapy   Patient is on Treatment Plan : PANCREATIC Abraxane / Gemcitabine D1,8,15 q28d     01/18/2021 Imaging   EXAM: CT CHEST, ABDOMEN, AND PELVIS WITH CONTRAST  IMPRESSION: 1. Today's study demonstrates positive response to therapy in terms of regression of numerous hypovascular hepatic lesions which likely represent hepatic metastases. 2. New pulmonary nodules seen on the prior study have regressed, favored to be indicative of benign infectious or inflammatory etiologies on the prior study. Old small pulmonary nodules are stable compared to the most recent prior examination, but new compared to more remote prior studies, likely to represent treated stable metastatic lesions. 3. The primary pancreatic mass continues to obstruct the main pancreatic duct, with worsening side branch ectasia throughout the body and tail of the pancreas and/or developing pancreatic pseudocysts, with evidence of pancreatitis in these regions, as above. 4. The primary pancreatic mass continues to cause severe narrowing of the superior mesenteric vein,  splenoportal confluence and proximal portal vein (which is nearly completely occluded). Associated with this are portosystemic collateral pathways, most notable for large gastric varices, and mild splenomegaly. 5. Aortic  atherosclerosis, in addition to left main and 2 vessel coronary artery disease. 6. Additional incidental findings, as above.      INTERVAL HISTORY:  ENDER RORKE is here for a follow up of pancreatic cancer. He was last seen by me on 01/19/21. He was seen in the infusion area. He reports the Xeloda "tore up my stomach." He reports it makes his stomach hurt, despite taking it with food. He points to a band in his mid-upper abdomen. He adds that it happens all day. He notes he tried to take oxycodone, but it did not touch the pain. He reports small, loose bowel movements, only once a day. He notes he was taken off prilosec due to an interaction with Xeloda. He reports his neuropathy is the same-- "it's not getting any better but it's not getting any worse."   All other systems were reviewed with the patient and are negative.  MEDICAL HISTORY:  Past Medical History:  Diagnosis Date   Arthritis    Atypical chest pain 04/2017   ACS ruled out 05/11/17   CAP (community acquired pneumonia) 05/07/2017   Chronic constipation    at least partially d/t chronic opioids   Diabetes mellitus without complication ([place])    Poor control starting fall 2021-->GAD ab neg.  Insulin and C peptide levels wnl but on lower end of normal->Dr Kuma/endo 2022.   Exocrine pancreatic insufficiency 2022   History of thyroidectomy, total 2018   Hurlthe cell adenoma   Hx of adenomatous polyp of colon 07/02/2009   rpt colonoscopy 2026   Hyperlipidemia    Intol of multiple statins and zetia. Advanced lipid clinic 2022.   Hypertension    Lipoma    back   Myocardial infarction [facility])    MI at age 3   Pancreatic adenocarcinoma ([place]) 06/2020   Mets to liver (?and lungs?->stage 4), w/acute pancreatitis.   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 Cammy Copa   Right ureteral calculus 04/2018   Cystoscopy with right retrograde pyelogram and right ureteral stent placement after stone removal.   Seasonal allergies    Stroke ([place])    in 16X no complications    Tachyarrhythmia 1980   age 74 X 2 over 35 month period.  No recurrence since that time.   Thoracic aortic aneurysm    Incidentally discovered, 4.1 cm-->Dr. Cyndia Bent. Rpt CT 08/2018 and 08/2019 stable.   Vocal cord paralysis 07/2016   Left vocal cord paralysis noted after thyroid surgery.    SURGICAL HISTORY: Past Surgical History:  Procedure Laterality Date   BIOPSY  06/18/2020   Procedure: BIOPSY;  Surgeon: Mansouraty, Telford Nab., MD;  Location: [facility] ENDOSCOPY;  Service: Gastroenterology;;   [place] W/ POLYPECTOMY  07/02/2009; 01/24/20   2011 adenoma.  2021 adenomas x 3->recall 2026   CYSTOSCOPY WITH RETROGRADE PYELOGRAM, URETEROSCOPY AND STENT PLACEMENT Right 05/09/2018   Procedure: CYSTOSCOPY WITH RIGHT RETROGRADE RIGHT URETEROSCOPY STONE EXTRACTION RIGHT STENT EXCHANGE;  Surgeon: Lucas Mallow, MD;  Location: [facility] ORS;  Service: Urology;  Laterality: Right;   CYSTOSCOPY/RETROGRADE/URETEROSCOPY/STONE EXTRACTION WITH  BASKET Right 04/30/2018   Procedure: CYSTOSCOPY/RETROGRADE/RIGHT URETEROSCOPY/;  Surgeon: Lucas Mallow, MD;  Location: [facility] ORS;  Service: Urology;  Laterality: Right;   ESOPHAGOGASTRODUODENOSCOPY  07/02/2009   NORMAL   ESOPHAGOGASTRODUODENOSCOPY (EGD) WITH PROPOFOL N/A 06/18/2020   Procedure: ESOPHAGOGASTRODUODENOSCOPY (EGD) WITH PROPOFOL;  Surgeon: Rush Landmark Telford Nab., MD;  Location: [place];  Service: Gastroenterology;  Laterality: N/A;   EYE SURGERY     FINE NEEDLE ASPIRATION  06/18/2020   Procedure: FINE NEEDLE ASPIRATION (FNA) LINEAR;  Surgeon: Irving Copas., MD;  Location: [place];  Service: Gastroenterology;;   finger amputaion     with reattachement    INCISION AND DRAINAGE ABSCESS ANAL  11/23/2019   gen surg (In ED).   IR IMAGING GUIDED PORT INSERTION  06/22/2020   LASIK     THYROIDECTOMY N/A 08/03/2016   Procedure: Total THYROIDECTOMY;  Surgeon: [name], MD;  Location: [place];  Service: ENT;  Laterality: N/A;  Total Thyroidectomy    UPPER ESOPHAGEAL ENDOSCOPIC ULTRASOUND (EUS) N/A 06/18/2020   Procedure: UPPER ESOPHAGEAL ENDOSCOPIC ULTRASOUND (EUS);  Surgeon: Irving Copas., MD;  Location: [place];  Service: Gastroenterology;  Laterality: N/A;   UPPER GASTROINTESTINAL ENDOSCOPY     with EUS and bx of pancreatic mass. +appearance of candida in esoph.      I have reviewed the social history and family history with the patient and they are unchanged from previous note.  ALLERGIES:  is allergic to atenolol, hydroxyzine, tetracycline, atorvastatin, codeine, guaifenesin, and mucinex [guaifenesin er].  MEDICATIONS:  Current Outpatient Medications  Medication Sig Dispense Refill   apixaban (ELIQUIS) 5 MG TABS tablet TAKE 1 TABLET(5 MG) BY MOUTH TWICE DAILY 60 tablet 1   capecitabine (XELODA) 500 MG tablet Take 3 tablets by mouth every 12 hr. Take within 30 minutes after meal. Take for 7 days on, then 7 days off, repeat every 14 days. 84 tablet 0   Continuous Blood Gluc Sensor (FREESTYLE LIBRE 2 SENSOR) MISC APPLY 1 SENSOR TO THE SKIN EVERY 14 DAYS AS DIRECTED 2 each 3   docusate sodium (COLACE) 100 MG capsule Take 100 mg by mouth 2 (two) times daily.     gabapentin (NEURONTIN) 100 MG capsule Take 1 capsule (100 mg total) by mouth 3 (three) times daily. Take during day, in additional to 325m capsules at evening 90 capsule 0   gabapentin (NEURONTIN) 300 MG capsule Take 1-2 capsules (300-600 mg total) by mouth 3 (three) times daily. 120 capsule 1   HUMALOG KWIKPEN 100 UNIT/ML KwikPen Inject 20-22 Units into the skin daily.     insulin glargine (LANTUS SOLOSTAR) 100 UNIT/ML Solostar Pen Give up to 10 units under the skin every evening  at bedtime. 15 mL 1   Insulin Pen Needle 31G X 5 MM MISC Use with insulin pen 3 times a day 100 each 1   lactulose (CHRONULAC) 10 GM/15ML solution Take 15 mLs (10 g total) by mouth 3 (three) times daily as needed for mild constipation. 236 mL 0   levothyroxine (SYNTHROID) 137 MCG tablet TAKE 1 TABLET BY MOUTH  DAILY BEFORE BREAKFAST 90 tablet 3   lipase/protease/amylase (CREON) 36000 UNITS CPEP capsule Take 2 capsules (72,000 Units total) by mouth 3 (three) times daily with meals. May also take 1 capsule (36,000 Units total) as needed (with snacks). 240 capsule 11   LORazepam (ATIVAN) 0.5 MG tablet Take 1 tablet (0.5 mg total) by mouth every 12 (twelve) hours as needed for anxiety. 60 tablet 0  magic mouthwash (nystatin, lidocaine, diphenhydrAMINE, alum & mag hydroxide) suspension Swish and swallow 5 mls by mouth every 6 hours as needed 280 mL 2   magic mouthwash w/lidocaine SOLN Take 5 mLs by mouth every 6 (six) hours as needed for up to 14 days for mouth pain. Swish and swallow 280 mL 1   magnesium hydroxide (MILK OF MAGNESIA) 400 MG/5ML suspension Take by mouth daily as needed for mild constipation.     oxyCODONE (OXY IR/ROXICODONE) 5 MG immediate release tablet Take 1 tablet (5 mg total) by mouth every 6 (six) hours as needed for severe pain or moderate pain. 60 tablet 0   polyethylene glycol (MIRALAX / GLYCOLAX) 17 g packet Take 17 g by mouth daily.     prochlorperazine (COMPAZINE) 10 MG tablet TAKE 1 TABLET(10 MG) BY MOUTH EVERY 6 HOURS AS NEEDED FOR NAUSEA OR VOMITING 30 tablet 1   ramipril (ALTACE) 10 MG capsule Take 10 mg by mouth 2 (two) times daily.     senna-docusate (SENOKOT-S) 8.6-50 MG tablet Take 1 tablet by mouth 2 (two) times daily.     No current facility-administered medications for this visit.   Facility-Administered Medications Ordered in Other Visits  Medication Dose Route Frequency Provider Last Rate Last Admin   LORazepam (ATIVAN) tablet 0.5 mg  0.5 mg Oral Once Truitt Merle, MD        PHYSICAL EXAMINATION: ECOG PERFORMANCE STATUS: 2 - Symptomatic, <50% confined to bed  There were no vitals filed for this visit. Wt Readings from Last 3 Encounters:  02/02/21 181 lb 4 oz (82.2 kg)  01/19/21 193 lb 6.4 oz (87.7 kg)  01/05/21 195 lb 6.4 oz (88.6 kg)     GENERAL:alert, no distress and comfortable SKIN: skin color, texture, turgor are normal, no rashes or significant lesions EYES: normal, Conjunctiva are pink and non-injected, sclera clear ABDOMEN:abdomen soft, and normal bowel sounds, (+) tenderness to left mid abdomen NEURO: alert & oriented x 3 with fluent speech, no focal motor/sensory deficits  LABORATORY DATA:  I have reviewed the data as listed CBC Latest Ref Rng & Units 02/02/2021 01/19/2021 01/05/2021  WBC 4.0 - 10.5 K/uL 5.0 4.1 3.8(L)  Hemoglobin 13.0 - 17.0 g/dL 12.3(L) 11.4(L) 11.6(L)  Hematocrit 39.0 - 52.0 % 35.0(L) 32.8(L) 33.6(L)  Platelets 150 - 400 K/uL 172 149(L) 126(L)     CMP Latest Ref Rng & Units 02/02/2021 01/19/2021 01/05/2021  Glucose 70 - 99 mg/dL 134(H) 103(H) 301(H)  BUN 8 - 23 mg/dL '13 11 11  ' Creatinine 0.61 - 1.24 mg/dL 1.06 0.93 1.01  Sodium 135 - 145 mmol/L 139 139 136  Potassium 3.5 - 5.1 mmol/L 3.9 3.8 4.3  Chloride 98 - 111 mmol/L 105 107 105  CO2 22 - 32 mmol/L '24 23 24  ' Calcium 8.9 - 10.3 mg/dL 9.6 9.0 8.9  Total Protein 6.5 - 8.1 g/dL 7.2 6.7 6.9  Total Bilirubin 0.3 - 1.2 mg/dL 0.6 0.5 0.6  Alkaline Phos 38 - 126 U/L 94 96 104  AST 15 - 41 U/L 14(L) 14(L) 12(L)  ALT 0 - 44 U/L '11 9 8      ' RADIOGRAPHIC STUDIES: I have personally reviewed the radiological images as listed and agreed with the findings in the report. No results found.    No orders of the defined types were placed in this encounter.  All questions were answered. The patient knows to call the clinic with any problems, questions or concerns. No barriers to learning was detected. The  total time spent in the appointment was 30  minutes.     Truitt Merle, MD 02/03/2021   I, Wilburn Mylar, am acting as scribe for Truitt Merle, MD.   I have reviewed the above documentation for accuracy and completeness, and I agree with the above.

## 2021-02-03 ENCOUNTER — Encounter: Payer: Self-pay | Admitting: Hematology and Oncology

## 2021-02-09 ENCOUNTER — Other Ambulatory Visit: Payer: Self-pay | Admitting: Hematology

## 2021-02-09 DIAGNOSIS — C259 Malignant neoplasm of pancreas, unspecified: Secondary | ICD-10-CM

## 2021-02-10 ENCOUNTER — Other Ambulatory Visit: Payer: Self-pay | Admitting: Hematology

## 2021-02-11 ENCOUNTER — Telehealth: Payer: Self-pay | Admitting: Hematology

## 2021-02-11 NOTE — Telephone Encounter (Signed)
Pt's wife Precious Bard called to report severe pain in left thigh which started early this week. He has tried oxycodone 5mg , morphine 15mg  and dilaudid 2mg  which none of them helped much. He is able to walk, pain is persistent, still has bad neuropathy. I encouraged him to slightly increase his pain meds, and will schedule him to see palliative care NP [facility] tomorrow at 11:30am. She voiced good understanding and agrees with the plan.  Truitt Merle  02/11/2021

## 2021-02-12 ENCOUNTER — Other Ambulatory Visit: Payer: Self-pay | Admitting: Nurse Practitioner

## 2021-02-12 ENCOUNTER — Inpatient Hospital Stay (HOSPITAL_COMMUNITY)
Admission: EM | Admit: 2021-02-12 | Discharge: 2021-02-25 | DRG: 438 | Disposition: A | Discharge status: Home / Self Care | Payer: 59 | Source: Ambulatory Visit | Attending: Internal Medicine | Admitting: Internal Medicine

## 2021-02-12 ENCOUNTER — Other Ambulatory Visit: Payer: Self-pay

## 2021-02-12 ENCOUNTER — Encounter (HOSPITAL_COMMUNITY): Payer: Self-pay

## 2021-02-12 ENCOUNTER — Inpatient Hospital Stay: Payer: 59

## 2021-02-12 ENCOUNTER — Ambulatory Visit (HOSPITAL_COMMUNITY)
Admission: RE | Admit: 2021-02-12 | Discharge: 2021-02-12 | Disposition: A | Discharge status: Home / Self Care | Payer: 59 | Source: Ambulatory Visit | Attending: Nurse Practitioner | Admitting: Nurse Practitioner

## 2021-02-12 ENCOUNTER — Inpatient Hospital Stay (HOSPITAL_BASED_OUTPATIENT_CLINIC_OR_DEPARTMENT_OTHER): Payer: 59 | Admitting: Nurse Practitioner

## 2021-02-12 ENCOUNTER — Encounter (HOSPITAL_COMMUNITY): Payer: Self-pay | Admitting: Emergency Medicine

## 2021-02-12 VITALS — BP 166/102 | HR 108 | Temp 98.0°F | Resp 19 | Ht 69.0 in | Wt 179.7 lb

## 2021-02-12 DIAGNOSIS — E782 Mixed hyperlipidemia: Secondary | ICD-10-CM

## 2021-02-12 DIAGNOSIS — C259 Malignant neoplasm of pancreas, unspecified: Secondary | ICD-10-CM

## 2021-02-12 DIAGNOSIS — D6481 Anemia due to antineoplastic chemotherapy: Secondary | ICD-10-CM | POA: Diagnosis not present

## 2021-02-12 DIAGNOSIS — I252 Old myocardial infarction: Secondary | ICD-10-CM | POA: Diagnosis not present

## 2021-02-12 DIAGNOSIS — D696 Thrombocytopenia, unspecified: Secondary | ICD-10-CM | POA: Diagnosis not present

## 2021-02-12 DIAGNOSIS — I10 Essential (primary) hypertension: Secondary | ICD-10-CM | POA: Diagnosis present

## 2021-02-12 DIAGNOSIS — Z23 Encounter for immunization: Secondary | ICD-10-CM | POA: Diagnosis not present

## 2021-02-12 DIAGNOSIS — G893 Neoplasm related pain (acute) (chronic): Secondary | ICD-10-CM

## 2021-02-12 DIAGNOSIS — R110 Nausea: Secondary | ICD-10-CM

## 2021-02-12 DIAGNOSIS — Z8261 Family history of arthritis: Secondary | ICD-10-CM

## 2021-02-12 DIAGNOSIS — Z83438 Family history of other disorder of lipoprotein metabolism and other lipidemia: Secondary | ICD-10-CM

## 2021-02-12 DIAGNOSIS — Z66 Do not resuscitate: Secondary | ICD-10-CM | POA: Diagnosis present

## 2021-02-12 DIAGNOSIS — R634 Abnormal weight loss: Secondary | ICD-10-CM | POA: Diagnosis not present

## 2021-02-12 DIAGNOSIS — E876 Hypokalemia: Secondary | ICD-10-CM | POA: Diagnosis not present

## 2021-02-12 DIAGNOSIS — R531 Weakness: Secondary | ICD-10-CM

## 2021-02-12 DIAGNOSIS — D61818 Other pancytopenia: Secondary | ICD-10-CM | POA: Diagnosis not present

## 2021-02-12 DIAGNOSIS — E440 Moderate protein-calorie malnutrition: Secondary | ICD-10-CM | POA: Diagnosis present

## 2021-02-12 DIAGNOSIS — I712 Thoracic aortic aneurysm, without rupture, unspecified: Secondary | ICD-10-CM | POA: Diagnosis present

## 2021-02-12 DIAGNOSIS — D61810 Antineoplastic chemotherapy induced pancytopenia: Secondary | ICD-10-CM | POA: Diagnosis present

## 2021-02-12 DIAGNOSIS — T451X5A Adverse effect of antineoplastic and immunosuppressive drugs, initial encounter: Secondary | ICD-10-CM | POA: Diagnosis present

## 2021-02-12 DIAGNOSIS — E039 Hypothyroidism, unspecified: Secondary | ICD-10-CM | POA: Diagnosis not present

## 2021-02-12 DIAGNOSIS — Z8673 Personal history of transient ischemic attack (TIA), and cerebral infarction without residual deficits: Secondary | ICD-10-CM

## 2021-02-12 DIAGNOSIS — F17290 Nicotine dependence, other tobacco product, uncomplicated: Secondary | ICD-10-CM | POA: Diagnosis present

## 2021-02-12 DIAGNOSIS — E890 Postprocedural hypothyroidism: Secondary | ICD-10-CM

## 2021-02-12 DIAGNOSIS — G620 Drug-induced polyneuropathy: Secondary | ICD-10-CM | POA: Diagnosis present

## 2021-02-12 DIAGNOSIS — Z20822 Contact with and (suspected) exposure to covid-19: Secondary | ICD-10-CM | POA: Diagnosis present

## 2021-02-12 DIAGNOSIS — Z794 Long term (current) use of insulin: Secondary | ICD-10-CM

## 2021-02-12 DIAGNOSIS — C250 Malignant neoplasm of head of pancreas: Secondary | ICD-10-CM | POA: Diagnosis present

## 2021-02-12 DIAGNOSIS — E119 Type 2 diabetes mellitus without complications: Secondary | ICD-10-CM

## 2021-02-12 DIAGNOSIS — C787 Secondary malignant neoplasm of liver and intrahepatic bile duct: Secondary | ICD-10-CM | POA: Diagnosis present

## 2021-02-12 DIAGNOSIS — C7802 Secondary malignant neoplasm of left lung: Secondary | ICD-10-CM | POA: Diagnosis present

## 2021-02-12 DIAGNOSIS — Z7901 Long term (current) use of anticoagulants: Secondary | ICD-10-CM

## 2021-02-12 DIAGNOSIS — K5903 Drug induced constipation: Secondary | ICD-10-CM | POA: Diagnosis present

## 2021-02-12 DIAGNOSIS — Z86718 Personal history of other venous thrombosis and embolism: Secondary | ICD-10-CM

## 2021-02-12 DIAGNOSIS — Z79899 Other long term (current) drug therapy: Secondary | ICD-10-CM | POA: Diagnosis not present

## 2021-02-12 DIAGNOSIS — K859 Acute pancreatitis without necrosis or infection, unspecified: Secondary | ICD-10-CM | POA: Diagnosis present

## 2021-02-12 DIAGNOSIS — K858 Other acute pancreatitis without necrosis or infection: Principal | ICD-10-CM | POA: Diagnosis present

## 2021-02-12 DIAGNOSIS — C7801 Secondary malignant neoplasm of right lung: Secondary | ICD-10-CM | POA: Diagnosis present

## 2021-02-12 DIAGNOSIS — Z5111 Encounter for antineoplastic chemotherapy: Secondary | ICD-10-CM | POA: Diagnosis present

## 2021-02-12 DIAGNOSIS — C7800 Secondary malignant neoplasm of unspecified lung: Secondary | ICD-10-CM | POA: Diagnosis not present

## 2021-02-12 DIAGNOSIS — F419 Anxiety disorder, unspecified: Secondary | ICD-10-CM | POA: Diagnosis present

## 2021-02-12 DIAGNOSIS — K8500 Idiopathic acute pancreatitis without necrosis or infection: Secondary | ICD-10-CM | POA: Diagnosis not present

## 2021-02-12 DIAGNOSIS — Z8249 Family history of ischemic heart disease and other diseases of the circulatory system: Secondary | ICD-10-CM

## 2021-02-12 DIAGNOSIS — T40605A Adverse effect of unspecified narcotics, initial encounter: Secondary | ICD-10-CM | POA: Diagnosis present

## 2021-02-12 DIAGNOSIS — Z87442 Personal history of urinary calculi: Secondary | ICD-10-CM

## 2021-02-12 DIAGNOSIS — Z6826 Body mass index (BMI) 26.0-26.9, adult: Secondary | ICD-10-CM

## 2021-02-12 DIAGNOSIS — Z825 Family history of asthma and other chronic lower respiratory diseases: Secondary | ICD-10-CM

## 2021-02-12 DIAGNOSIS — E11649 Type 2 diabetes mellitus with hypoglycemia without coma: Secondary | ICD-10-CM | POA: Diagnosis not present

## 2021-02-12 DIAGNOSIS — Z79890 Hormone replacement therapy (postmenopausal): Secondary | ICD-10-CM

## 2021-02-12 DIAGNOSIS — R1013 Epigastric pain: Secondary | ICD-10-CM | POA: Diagnosis not present

## 2021-02-12 DIAGNOSIS — Z515 Encounter for palliative care: Secondary | ICD-10-CM | POA: Insufficient documentation

## 2021-02-12 DIAGNOSIS — Z833 Family history of diabetes mellitus: Secondary | ICD-10-CM

## 2021-02-12 DIAGNOSIS — Z7189 Other specified counseling: Secondary | ICD-10-CM | POA: Diagnosis not present

## 2021-02-12 DIAGNOSIS — Z86010 Personal history of colonic polyps: Secondary | ICD-10-CM

## 2021-02-12 DIAGNOSIS — Z808 Family history of malignant neoplasm of other organs or systems: Secondary | ICD-10-CM

## 2021-02-12 LAB — CBC WITH DIFFERENTIAL/PLATELET
Abs Immature Granulocytes: 0 10*3/uL (ref 0.00–0.07)
Basophils Absolute: 0 10*3/uL (ref 0.0–0.1)
Basophils Relative: 0 %
Eosinophils Absolute: 0 10*3/uL (ref 0.0–0.5)
Eosinophils Relative: 1 %
HCT: 31.1 % — ABNORMAL LOW (ref 39.0–52.0)
Hemoglobin: 10.5 g/dL — ABNORMAL LOW (ref 13.0–17.0)
Immature Granulocytes: 0 %
Lymphocytes Relative: 34 %
Lymphs Abs: 0.9 10*3/uL (ref 0.7–4.0)
MCH: 31.3 pg (ref 26.0–34.0)
MCHC: 33.8 g/dL (ref 30.0–36.0)
MCV: 92.8 fL (ref 80.0–100.0)
Monocytes Absolute: 0.3 10*3/uL (ref 0.1–1.0)
Monocytes Relative: 12 %
Neutro Abs: 1.4 10*3/uL — ABNORMAL LOW (ref 1.7–7.7)
Neutrophils Relative %: 53 %
Platelets: 93 10*3/uL — ABNORMAL LOW (ref 150–400)
RBC: 3.35 MIL/uL — ABNORMAL LOW (ref 4.22–5.81)
RDW: 12.4 % (ref 11.5–15.5)
WBC: 2.7 10*3/uL — ABNORMAL LOW (ref 4.0–10.5)
nRBC: 0 % (ref 0.0–0.2)

## 2021-02-12 LAB — COMPREHENSIVE METABOLIC PANEL
ALT: 12 U/L (ref 0–44)
AST: 14 U/L — ABNORMAL LOW (ref 15–41)
Albumin: 3.8 g/dL (ref 3.5–5.0)
Alkaline Phosphatase: 75 U/L (ref 38–126)
Anion gap: 5 (ref 5–15)
BUN: 13 mg/dL (ref 8–23)
CO2: 21 mmol/L — ABNORMAL LOW (ref 22–32)
Calcium: 8.4 mg/dL — ABNORMAL LOW (ref 8.9–10.3)
Chloride: 109 mmol/L (ref 98–111)
Creatinine, Ser: 0.87 mg/dL (ref 0.61–1.24)
GFR, Estimated: >60 mL/min (ref >60)
Glucose, Bld: 124 mg/dL — ABNORMAL HIGH (ref 70–99)
Potassium: 3.8 mmol/L (ref 3.5–5.1)
Sodium: 135 mmol/L (ref 135–145)
Total Bilirubin: 0.6 mg/dL (ref 0.3–1.2)
Total Protein: 6.8 g/dL (ref 6.5–8.1)

## 2021-02-12 LAB — MAGNESIUM: Magnesium: 2 mg/dL (ref 1.7–2.4)

## 2021-02-12 LAB — FOLATE: Folate: 18.2 ng/mL (ref >5.9)

## 2021-02-12 LAB — IRON AND TIBC
Iron: 34 ug/dL — ABNORMAL LOW (ref 45–182)
Saturation Ratios: 15 % — ABNORMAL LOW (ref 17.9–39.5)
TIBC: 224 ug/dL — ABNORMAL LOW (ref 250–450)
UIBC: 190 ug/dL

## 2021-02-12 LAB — RETICULOCYTES
Immature Retic Fract: 14.4 % (ref 2.3–15.9)
RBC.: 3.28 MIL/uL — ABNORMAL LOW (ref 4.22–5.81)
Retic Count, Absolute: 61.7 10*3/uL (ref 19.0–186.0)
Retic Ct Pct: 1.9 % (ref 0.4–3.1)

## 2021-02-12 LAB — LACTIC ACID, PLASMA: Lactic Acid, Venous: 0.7 mmol/L (ref 0.5–1.9)

## 2021-02-12 LAB — FERRITIN: Ferritin: 212 ng/mL (ref 24–336)

## 2021-02-12 LAB — PHOSPHORUS: Phosphorus: 4.1 mg/dL (ref 2.5–4.6)

## 2021-02-12 LAB — RESP PANEL BY RT-PCR (FLU A&B, COVID) ARPGX2
Influenza A by PCR: NEGATIVE
Influenza B by PCR: NEGATIVE
SARS Coronavirus 2 by RT PCR: NEGATIVE

## 2021-02-12 LAB — LIPASE, BLOOD: Lipase: 21 U/L (ref 11–51)

## 2021-02-12 LAB — CK: Total CK: 53 U/L (ref 49–397)

## 2021-02-12 LAB — VITAMIN B12: Vitamin B-12: 364 pg/mL (ref 180–914)

## 2021-02-12 LAB — CBG MONITORING, ED: Glucose-Capillary: 89 mg/dL (ref 70–99)

## 2021-02-12 MED ORDER — HYDROMORPHONE HCL 1 MG/ML IJ SOLN
0.5000 mg | Freq: Once | INTRAMUSCULAR | Status: AC
  Administered 2021-02-12: 14:00:00 0.5 mg via INTRAVENOUS
  Filled 2021-02-12: qty 1

## 2021-02-12 MED ORDER — LORAZEPAM 0.5 MG PO TABS
0.5000 mg | ORAL_TABLET | Freq: Two times a day (BID) | ORAL | Status: DC | PRN
  Administered 2021-02-13 – 2021-02-21 (×10): 0.5 mg via ORAL
  Filled 2021-02-12 (×10): qty 1

## 2021-02-12 MED ORDER — SODIUM CHLORIDE 0.9 % IV SOLN: INTRAVENOUS | Status: DC

## 2021-02-12 MED ORDER — HYDROMORPHONE HCL 1 MG/ML IJ SOLN: 0.5000 mg | Freq: Once | INTRAMUSCULAR | Status: DC

## 2021-02-12 MED ORDER — SODIUM CHLORIDE (PF) 0.9 % IJ SOLN
INTRAMUSCULAR | Status: AC
  Filled 2021-02-12: qty 50

## 2021-02-12 MED ORDER — HYDROMORPHONE HCL 1 MG/ML IJ SOLN
0.5000 mg | Freq: Once | INTRAMUSCULAR | Status: AC
  Administered 2021-02-12: 16:00:00 0.5 mg via INTRAVENOUS
  Filled 2021-02-12: qty 1

## 2021-02-12 MED ORDER — HEPARIN SOD (PORK) LOCK FLUSH 100 UNIT/ML IV SOLN
INTRAVENOUS | Status: AC
  Filled 2021-02-12: qty 5

## 2021-02-12 MED ORDER — HYDROMORPHONE HCL 2 MG PO TABS: 2.0000 mg | ORAL_TABLET | Freq: Four times a day (QID) | ORAL | 0 refills | Status: DC | PRN

## 2021-02-12 MED ORDER — APIXABAN 5 MG PO TABS
5.0000 mg | ORAL_TABLET | Freq: Two times a day (BID) | ORAL | Status: DC
  Administered 2021-02-12 – 2021-02-25 (×26): 5 mg via ORAL
  Filled 2021-02-12 (×26): qty 1

## 2021-02-12 MED ORDER — HEPARIN SOD (PORK) LOCK FLUSH 100 UNIT/ML IV SOLN
500.0000 [IU] | Freq: Once | INTRAVENOUS | Status: AC
  Administered 2021-02-12: 15:00:00 500 [IU] via INTRAVENOUS

## 2021-02-12 MED ORDER — IOHEXOL 350 MG/ML SOLN
80.0000 mL | Freq: Once | INTRAVENOUS | Status: AC | PRN
  Administered 2021-02-12: 15:00:00 80 mL via INTRAVENOUS

## 2021-02-12 MED ORDER — HYDROMORPHONE HCL 1 MG/ML IJ SOLN
1.0000 mg | INTRAMUSCULAR | Status: DC | PRN
Start: 2021-02-12 — End: 2021-02-13
  Administered 2021-02-12 – 2021-02-13 (×4): 1 mg via INTRAVENOUS
  Filled 2021-02-12 (×4): qty 1

## 2021-02-12 MED ORDER — HYDROMORPHONE HCL 1 MG/ML IJ SOLN
1.0000 mg | Freq: Once | INTRAMUSCULAR | Status: AC
  Administered 2021-02-12: 18:00:00 1 mg via INTRAVENOUS
  Filled 2021-02-12: qty 1

## 2021-02-12 MED ORDER — ONDANSETRON HCL 4 MG/2ML IJ SOLN
4.0000 mg | Freq: Once | INTRAMUSCULAR | Status: AC
  Administered 2021-02-12: 13:00:00 4 mg via INTRAVENOUS

## 2021-02-12 MED ORDER — INSULIN ASPART 100 UNIT/ML IJ SOLN
0.0000 [IU] | INTRAMUSCULAR | Status: DC
  Administered 2021-02-13 – 2021-02-15 (×8): 1 [IU] via SUBCUTANEOUS
  Administered 2021-02-15: 2 [IU] via SUBCUTANEOUS
  Administered 2021-02-16 (×2): 1 [IU] via SUBCUTANEOUS
  Administered 2021-02-16: 2 [IU] via SUBCUTANEOUS
  Administered 2021-02-16 – 2021-02-18 (×9): 1 [IU] via SUBCUTANEOUS
  Administered 2021-02-18: 2 [IU] via SUBCUTANEOUS
  Administered 2021-02-19: 0 [IU] via SUBCUTANEOUS
  Administered 2021-02-19 (×2): 1 [IU] via SUBCUTANEOUS
  Administered 2021-02-19 (×2): 2 [IU] via SUBCUTANEOUS
  Administered 2021-02-20: 3 [IU] via SUBCUTANEOUS
  Administered 2021-02-20: 2 [IU] via SUBCUTANEOUS
  Administered 2021-02-20 – 2021-02-21 (×2): 1 [IU] via SUBCUTANEOUS
  Administered 2021-02-21: 2 [IU] via SUBCUTANEOUS
  Administered 2021-02-21 (×2): 1 [IU] via SUBCUTANEOUS
  Administered 2021-02-22: 2 [IU] via SUBCUTANEOUS
  Administered 2021-02-22: 5 [IU] via SUBCUTANEOUS
  Administered 2021-02-22: 1 [IU] via SUBCUTANEOUS
  Administered 2021-02-22: 2 [IU] via SUBCUTANEOUS
  Administered 2021-02-22: 1 [IU] via SUBCUTANEOUS
  Administered 2021-02-23 (×3): 2 [IU] via SUBCUTANEOUS
  Administered 2021-02-23 (×2): 1 [IU] via SUBCUTANEOUS
  Filled 2021-02-12: qty 0.09

## 2021-02-12 MED ORDER — FAMOTIDINE 20 MG IN NS 100 ML IVPB
20.0000 mg | Freq: Once | INTRAVENOUS | Status: AC
  Administered 2021-02-12: 13:00:00 20 mg via INTRAVENOUS

## 2021-02-12 NOTE — Assessment & Plan Note (Signed)
Check cholesterol panel

## 2021-02-12 NOTE — Progress Notes (Signed)
[place]  [phone] [phone]   Name: Timothy Page Date: 02/12/2021 MRN: [id number]  DOB: Nov 01, 1955  Patient Care Team: [name], MD as PCP - General (Hematology and Oncology) Gatha Mayer, MD as Consulting Physician (Gastroenterology) [name], MD as Consulting Physician (Otolaryngology) Elayne Snare, MD as Consulting Physician (Endocrinology) Gaye Pollack, MD as Consulting Physician (Cardiothoracic Surgery) Lucas Mallow, MD as Consulting Physician (Urology) Debara Pickett Nadean Corwin, MD as Consulting Physician (Cardiology) [name], MD as Consulting Physician (Hematology and Oncology) Jonnie Finner, RN (Inactive) as Oncology Nurse Navigator    REASON FOR CONSULTATION: Timothy Page is a 65 y.o. male with medical history including stage IV pancreatic cancer with mets to the lung and liver (currently on second line treatment with gemcitabine and Xeloda), chemo induced neuropathy, diabetes, hypothyroidism, and anxiety.  Palliative ask to see for symptom management and goals of care.    SOCIAL HISTORY:     reports that he has never smoked. His smokeless tobacco use includes snuff. He reports that he does not drink alcohol and does not use drugs.  ADVANCE DIRECTIVES:  None on file  CODE STATUS: DNR  PAST MEDICAL HISTORY: Past Medical History:  Diagnosis Date   Arthritis    Atypical chest pain 04/2017   ACS ruled out 05/11/17   CAP (community acquired pneumonia) 05/07/2017   Chronic constipation    at least partially d/t chronic opioids   Diabetes mellitus without complication ([place])    Poor control starting fall 2021-->GAD ab neg.  Insulin and C peptide levels wnl but on lower end of normal->Dr Kuma/endo 2022.   Exocrine pancreatic insufficiency 2022   History of thyroidectomy, total 2018   Hurlthe cell adenoma   Hx of adenomatous polyp of colon 07/02/2009   rpt colonoscopy 2026    Hyperlipidemia    Intol of multiple statins and zetia. [facility] lipid clinic 2022.   Hypertension    Lipoma    back   Myocardial infarction [facility])    MI at age 49   Pancreatic adenocarcinoma ([place]) 06/2020   Mets to liver (?and lungs?->stage 4), w/acute pancreatitis.   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 Cammy Copa   Right ureteral calculus 04/2018   Cystoscopy with right retrograde pyelogram and right ureteral stent placement after stone removal.   Seasonal allergies    Stroke ([place])    in 25K no complications    Tachyarrhythmia 1980   age 56 X 2 over 66 month period.  No recurrence since that time.   Thoracic aortic aneurysm    Incidentally discovered, 4.1 cm-->Dr. Cyndia Bent. Rpt CT 08/2018 and 08/2019 stable.   Vocal cord paralysis 07/2016   Left vocal cord paralysis noted after thyroid surgery.    PAST SURGICAL HISTORY:  Past Surgical History:  Procedure Laterality Date   BIOPSY  06/18/2020   Procedure: BIOPSY;  Surgeon: Mansouraty, Telford Nab., MD;  Location: [facility] ENDOSCOPY;  Service: Gastroenterology;;   [place] W/ POLYPECTOMY  07/02/2009; 01/24/20   2011 adenoma.  2021 adenomas x 3->recall 2026   CYSTOSCOPY WITH RETROGRADE PYELOGRAM, URETEROSCOPY AND STENT PLACEMENT Right 05/09/2018   Procedure: CYSTOSCOPY WITH RIGHT RETROGRADE RIGHT URETEROSCOPY STONE EXTRACTION RIGHT  STENT EXCHANGE;  Surgeon: Lucas Mallow, MD;  Location: [facility] ORS;  Service: Urology;  Laterality: Right;   CYSTOSCOPY/RETROGRADE/URETEROSCOPY/STONE EXTRACTION WITH BASKET Right 04/30/2018   Procedure: CYSTOSCOPY/RETROGRADE/RIGHT URETEROSCOPY/;  Surgeon: Lucas Mallow, MD;  Location: [facility] ORS;  Service: Urology;  Laterality: Right;    ESOPHAGOGASTRODUODENOSCOPY  07/02/2009   NORMAL   ESOPHAGOGASTRODUODENOSCOPY (EGD) WITH PROPOFOL N/A 06/18/2020   Procedure: ESOPHAGOGASTRODUODENOSCOPY (EGD) WITH PROPOFOL;  Surgeon: Rush Landmark Telford Nab., MD;  Location: [place];  Service: Gastroenterology;  Laterality: N/A;   EYE SURGERY     FINE NEEDLE ASPIRATION  06/18/2020   Procedure: FINE NEEDLE ASPIRATION (FNA) LINEAR;  Surgeon: Irving Copas., MD;  Location: [place];  Service: Gastroenterology;;   finger amputaion     with reattachement   INCISION AND DRAINAGE ABSCESS ANAL  11/23/2019   gen surg (In ED).   IR IMAGING GUIDED PORT INSERTION  06/22/2020   LASIK     THYROIDECTOMY N/A 08/03/2016   Procedure: Total THYROIDECTOMY;  Surgeon: [name], MD;  Location: [place];  Service: ENT;  Laterality: N/A;  Total Thyroidectomy    UPPER ESOPHAGEAL ENDOSCOPIC ULTRASOUND (EUS) N/A 06/18/2020   Procedure: UPPER ESOPHAGEAL ENDOSCOPIC ULTRASOUND (EUS);  Surgeon: Irving Copas., MD;  Location: [place];  Service: Gastroenterology;  Laterality: N/A;   UPPER GASTROINTESTINAL ENDOSCOPY     with EUS and bx of pancreatic mass. +appearance of candida in esoph.      HEMATOLOGY/ONCOLOGY HISTORY:  Oncology History  Adenocarcinoma of pancreas, stage 4 ([place])  06/20/2020 Initial Diagnosis   Adenocarcinoma of pancreas, stage 4 ([place])   07/01/2020 - 10/21/2020 Chemotherapy          07/01/2020 Genetic Testing   Negative genetic testing on a custom panel reviewing multicancer and pancreatitis genes.  The Custom-Gene Panel offered by [facility] includes sequencing and/or deletion duplication testing of the following 91 genes: AIP, ALK, APC*, ATM*, AXIN2, BAP1, BARD1, BLM, BMPR1A, BRCA1, BRCA2, BRIP1, CASR, CDC73, CDH1, CDK4, CDKN1B, CDKN1C, CDKN2A (p14ARF), CDKN2A (p16INK4a), CEBPA, CFTR*, CHEK2, CPA1, CTNNA1, CTRC, DICER1*, DIS3L2, EGFR, EPCAM*, FANCC, FH*, FLCN, GATA2, GPC3*, GREM1*, HOXB13, HRAS, KIT, MAX*, MEN1*, MET*, MITF*, MLH1*,  MSH2*, MSH3*, MSH6*, MUTYH, NBN, NF1*, NF2, NTHL1, PALB2, PALLD, PDGFRA, PHOX2B*, PMS2*, POLD1*, POLE, POT1, PRKAR1A, PRSS1*, PTCH1, PTEN*, RAD50, RAD51C, RAD51D, RB1*, RECQL4*, RET, RUNX1, SDHA*, SDHAF2, SDHB, SDHC*, SDHD, SMAD4, SMARCA4, SMARCB1, SMARCE1, SPINK1, STK11, SUFU, TERC, TERT, TMEM127, TP53, TSC1*, TSC2, VHL, WRN*, and WT1.  The report date is Jul 01, 2020.   08/26/2020 Imaging   Imaging after 4 cycles of therapy  IMPRESSION: 1. Pancreatic neck mass is stable. 2. No new or progressive metastatic disease. Tiny soft tissue nodule along the posteroinferior right liver capsule is stable. Solitary liver metastasis adjacent to the gallbladder is mildly decreased. Small bilateral pulmonary nodules are all stable. 3. New small nonocclusive thrombosis in the main portal vein. Persistent near occlusion of the proximal main portal vein with narrowing of the portal splenic venous confluence. 4. Aortic Atherosclerosis (ICD10-I70.0).   11/02/2020 Imaging   EXAM: CT CHEST, ABDOMEN, AND PELVIS WITH CONTRAST  IMPRESSION: 1. Two new pulmonary nodules, suspicious for progressive metastatic disease. Other pulmonary nodules are similar. Given somewhat ill-defined morphology of the posterior right upper lobe new nodule, recommend clinical exclusion of mild infection, felt less likely. 2. New hypoattenuating lesion within the posterior right hepatic lobe, favoring metastatic disease. Given areas of presumed focal steatosis adjacent the gallbladder, rounded focal steatosis is possible but felt less likely. If imaged  differentiation is desired, pre and post-contrast MRI or PET would likely be definitive. 3. The pancreatic primary is slightly decreased and less well-defined today. 4. Otherwise, no new sites of metastatic disease. 5.  Possible constipation. 6. Coronary artery atherosclerosis. Aortic Atherosclerosis (ICD10-I70.0).   11/10/2020 -  Chemotherapy   Patient is on Treatment Plan : PANCREATIC  Abraxane / Gemcitabine D1,8,15 q28d     01/18/2021 Imaging   EXAM: CT CHEST, ABDOMEN, AND PELVIS WITH CONTRAST  IMPRESSION: 1. Today's study demonstrates positive response to therapy in terms of regression of numerous hypovascular hepatic lesions which likely represent hepatic metastases. 2. New pulmonary nodules seen on the prior study have regressed, favored to be indicative of benign infectious or inflammatory etiologies on the prior study. Old small pulmonary nodules are stable compared to the most recent prior examination, but new compared to more remote prior studies, likely to represent treated stable metastatic lesions. 3. The primary pancreatic mass continues to obstruct the main pancreatic duct, with worsening side branch ectasia throughout the body and tail of the pancreas and/or developing pancreatic pseudocysts, with evidence of pancreatitis in these regions, as above. 4. The primary pancreatic mass continues to cause severe narrowing of the superior mesenteric vein, splenoportal confluence and proximal portal vein (which is nearly completely occluded). Associated with this are portosystemic collateral pathways, most notable for large gastric varices, and mild splenomegaly. 5. Aortic atherosclerosis, in addition to left main and 2 vessel coronary artery disease. 6. Additional incidental findings, as above.     ALLERGIES:  is allergic to atenolol, hydroxyzine, tetracycline, atorvastatin, codeine, guaifenesin, and mucinex [guaifenesin er].  MEDICATIONS:  Current Outpatient Medications  Medication Sig Dispense Refill   HYDROmorphone (DILAUDID) 2 MG tablet Take 1 tablet (2 mg total) by mouth every 6 (six) hours as needed for severe pain. 90 tablet 0   LORazepam (ATIVAN) 0.5 MG tablet TAKE 1 TABLET(0.5 MG) BY MOUTH EVERY 12 HOURS AS NEEDED FOR ANXIETY 60 tablet 0   prochlorperazine (COMPAZINE) 10 MG tablet TAKE 1 TABLET(10 MG) BY MOUTH EVERY 6 HOURS AS NEEDED FOR NAUSEA OR  VOMITING 30 tablet 1   apixaban (ELIQUIS) 5 MG TABS tablet TAKE 1 TABLET(5 MG) BY MOUTH TWICE DAILY 60 tablet 1   capecitabine (XELODA) 500 MG tablet TAKE 3 TABLETS BY MOUTH EVERY 12 HOURS - TAKE WITHIN 30 MINUTES  AFTER MEALS FOR 7 DAYS ON, THEN  7 DAYS OFF, REPEAT EVERY 14 DAYS (Patient not taking: Reported on 02/12/2021) 84 tablet 0   Continuous Blood Gluc Sensor (FREESTYLE LIBRE 2 SENSOR) MISC APPLY 1 SENSOR TO THE SKIN EVERY 14 DAYS AS DIRECTED 2 each 3   docusate sodium (COLACE) 100 MG capsule Take 100 mg by mouth 2 (two) times daily.     gabapentin (NEURONTIN) 100 MG capsule Take 1 capsule (100 mg total) by mouth 3 (three) times daily. Take during day, in additional to 323m capsules at evening (Patient not taking: Reported on 02/12/2021) 90 capsule 0   gabapentin (NEURONTIN) 300 MG capsule Take 1-2 capsules (300-600 mg total) by mouth 3 (three) times daily. (Patient not taking: Reported on 02/12/2021) 120 capsule 1   HUMALOG KWIKPEN 100 UNIT/ML KwikPen Inject 20-22 Units into the skin daily.     insulin glargine (LANTUS SOLOSTAR) 100 UNIT/ML Solostar Pen Give up to 10 units under the skin every evening at bedtime. 15 mL 1   Insulin Pen Needle 31G X 5 MM MISC Use with insulin pen 3 times a day 100 each 1  lactulose (CHRONULAC) 10 GM/15ML solution Take 15 mLs (10 g total) by mouth 3 (three) times daily as needed for mild constipation. 236 mL 0   levothyroxine (SYNTHROID) 137 MCG tablet TAKE 1 TABLET BY MOUTH  DAILY BEFORE BREAKFAST 90 tablet 3   lipase/protease/amylase (CREON) 36000 UNITS CPEP capsule Take 2 capsules (72,000 Units total) by mouth 3 (three) times daily with meals. May also take 1 capsule (36,000 Units total) as needed (with snacks). 240 capsule 11   magic mouthwash (nystatin, lidocaine, diphenhydrAMINE, alum & mag hydroxide) suspension Swish and swallow 5 mls by mouth every 6 hours as needed 280 mL 2   magnesium hydroxide (MILK OF MAGNESIA) 400 MG/5ML suspension Take by mouth  daily as needed for mild constipation.     polyethylene glycol (MIRALAX / GLYCOLAX) 17 g packet Take 17 g by mouth daily.     ramipril (ALTACE) 10 MG capsule Take 10 mg by mouth 2 (two) times daily.     senna-docusate (SENOKOT-S) 8.6-50 MG tablet Take 1 tablet by mouth 2 (two) times daily.     Current Facility-Administered Medications  Medication Dose Route Frequency Provider Last Rate Last Admin   0.9 %  sodium chloride infusion   Intravenous Continuous Pickenpack-Cousar, Carlena Sax, NP 500 mL/hr at 02/12/21 1235 New Bag at 02/12/21 1235   Facility-Administered Medications Ordered in Other Visits  Medication Dose Route Frequency Provider Last Rate Last Admin   heparin lock flush 100 UNIT/ML injection            sodium chloride (PF) 0.9 % injection             VITAL SIGNS: BP (!) 166/102 (BP Location: Right Arm, Patient Position: Sitting) Comment: Delano Metz RN aware   Pulse (!) 108    Temp 98 F (36.7 C) (Oral)    Resp 19    Ht _0  (1.753 m)    Wt 179 lb 11.2 oz (81.5 kg)    SpO2 98%    BMI 26.54 kg/m  Filed Weights   02/12/21 1135  Weight: 179 lb 11.2 oz (81.5 kg)    Estimated body mass index is 26.54 kg/m as calculated from the following:   Height as of this encounter: _1  (1.753 m).   Weight as of this encounter: 179 lb 11.2 oz (81.5 kg).  LABS: CBC:    Component Value Date/Time   WBC 5.0 02/02/2021 1219   HGB 12.3 (L) 02/02/2021 1219   HGB 11.6 (L) 01/05/2021 1028   HCT 35.0 (L) 02/02/2021 1219   PLT 172 02/02/2021 1219   PLT 126 (L) 01/05/2021 1028   MCV 92.3 02/02/2021 1219   NEUTROABS 3.2 02/02/2021 1219   LYMPHSABS 1.2 02/02/2021 1219   MONOABS 0.6 02/02/2021 1219   EOSABS 0.0 02/02/2021 1219   BASOSABS 0.0 02/02/2021 1219   Comprehensive Metabolic Panel:    Component Value Date/Time   NA 139 02/02/2021 1220   K 3.9 02/02/2021 1220   CL 105 02/02/2021 1220   CO2 24 02/02/2021 1220   BUN 13 02/02/2021 1220   CREATININE 1.06 02/02/2021 1220   CREATININE  1.11 11/03/2017 1533   GLUCOSE 134 (H) 02/02/2021 1220   CALCIUM 9.6 02/02/2021 1220   AST 14 (L) 02/02/2021 1220   ALT 11 02/02/2021 1220   ALKPHOS 94 02/02/2021 1220   BILITOT 0.6 02/02/2021 1220   PROT 7.2 02/02/2021 1220   ALBUMIN 4.2 02/02/2021 1220    RADIOGRAPHIC STUDIES: CT ABDOMEN PELVIS W CONTRAST  Result  Date: 02/12/2021 CLINICAL DATA:  Pancreatic cancer with acute abdominal pain. Mid abdominal pain since diagnosis now worsening since 02/08/2021. Also reportedly with nausea constipation. EXAM: CT ABDOMEN AND PELVIS WITH CONTRAST TECHNIQUE: Multidetector CT imaging of the abdomen and pelvis was performed using the standard protocol following bolus administration of intravenous contrast. CONTRAST:  65m OMNIPAQUE IOHEXOL 350 MG/ML SOLN COMPARISON:  November 02, 2020 also compared with January 18, 2021. FINDINGS: Lower chest: Port-A-Cath terminates in the distal superior vena cava. Lung bases are clear. No effusion. No consolidative changes. Hepatobiliary: Subtle peripheral foci of arterial phase enhancement without washout particularly about the gallbladder fossa no substantial change compared to previous imaging. Hypodense area along the gallbladder fossa (image 52/7) 15 mm stable since the December 5th evaluation. Hypodensity anterior to the porta hepatis stable since December 5th as well. 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 No pericholecystic stranding or intrahepatic biliary duct distension. Pancreas: Pancreatic head mass obstructing the main pancreatic duct is difficult to separate from numerous areas of low attenuation about the pancreatic parenchyma that have developed surrounding ductal dilation that was seen on more remote studies. 2.5 cm area is suspected in the head of the pancreas obstructing the duct. Nodularity in the transverse mesocolon. Areas of low attenuation in the  pancreatic tail have increased in size, for instance (image 40/7) 18 mm as compared to 14 mm. Other areas of low attenuation are minimally increased. Spleen: Normal. Adrenals/Urinary Tract: Insert normal renal contrast Stomach/Bowel: Abundant collateral venous structures about the stomach related to occluded splenic portal confluence. Engorgement of the gastro colic trunk and gastroepiploic veins. This is similar to prior studies. 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 Inflammation contacts the posterior wall of the gastric antrum. No sign of bowel obstruction. The appendix is normal. Vascular/Lymphatic: Aortic atherosclerosis. No sign of aneurysm. Smooth contour of the IVC. There is no gastrohepatic or hepatoduodenal ligament lymphadenopathy. No retroperitoneal or mesenteric lymphadenopathy. No pelvic sidewall lymphadenopathy. Reproductive: Unremarkable. Other: No free intraperitoneal air. No overt peritoneal nodularity. Trace ascites. This is present only in the pelvis Musculoskeletal: No acute bone finding. No destructive bone process. Spinal degenerative changes. 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 Irregular dilated main pancreatic duct gives way to ovoid areas of low attenuation surrounded by inflammation in the midst of remaining parenchyma in the pancreatic tail. Findings are compatible with acute interstitial edematous pancreatitis likely with dilated side branches. It would be difficult to exclude concomitant worsening of pancreatic neoplasm in the current setting. What can be seen in the pancreatic head is very similar to previous imaging. Decreasing size of low-attenuation areas in the liver, areas along the gallbladder fossa and anterior to porta hepatis are compatible with focal fat deposition the other area seen inferior to these areas  along hepatic subsegment VI based on appearance and evolution is also more likely to represent focal fat. Consider attention on follow-up or a differentiation would change management PET or MRI could be considered as discussed previously. These results will be called to the ordering clinician or representative by the Radiologist Assistant, and communication documented in the PACS or [employer] Electronically Signed   By: GZetta BillsM.D.   On: 02/12/2021 15:19   CT CHEST ABDOMEN PELVIS W CONTRAST  Result Date: 01/19/2021 CLINICAL DATA:  65year old  male with history of pancreatic cancer. Evaluate for treatment response. EXAM: CT CHEST, ABDOMEN, AND PELVIS WITH CONTRAST TECHNIQUE: Multidetector CT imaging of the chest, abdomen and pelvis was performed following the standard protocol during bolus administration of intravenous contrast. CONTRAST:  81m OMNIPAQUE IOHEXOL 350 MG/ML SOLN COMPARISON:  CT the chest, abdomen and pelvis 11/02/2020. FINDINGS: CT CHEST FINDINGS Cardiovascular: Heart size is normal. There is no significant pericardial fluid, thickening or pericardial calcification. There is aortic atherosclerosis, as well as atherosclerosis of the great vessels of the mediastinum and the coronary arteries, including calcified atherosclerotic plaque in the left main, left anterior descending and right coronary arteries. Right internal jugular single-lumen porta cath with tip terminating in the right atrium. Mediastinum/Nodes: No pathologically enlarged mediastinal or hilar lymph nodes. Esophagus is unremarkable in appearance. No axillary lymphadenopathy. Lungs/Pleura: Previously noted ill-defined nodule of concern in the posterior aspect of the right upper lobe has completely resolved, indicative of a benign infectious or inflammatory etiology on the prior study. Previously noted 5 mm right middle lobe pulmonary nodule has also resolved. Two left lower lobe pulmonary nodules are again noted on axial  image 75 of series 12, largest of which measures 6 mm, stable, but new when compared to more remote prior studies. No other new suspicious appearing pulmonary nodules or masses are noted. No acute consolidative airspace disease. No pleural effusions. Musculoskeletal: There are no aggressive appearing lytic or blastic lesions noted in the visualized portions of the skeleton. CT ABDOMEN PELVIS FINDINGS Hepatobiliary: Previously noted new hypovascular lesion in segment 5 of the liver has decreased slightly in size compared to the prior study (axial image 163 of series 11), currently measuring 2.1 x 1.9 cm (previously 3.1 x 2.5 cm), suggesting a partial response to therapy for a metastatic lesion. Other hypovascular areas adjacent to the gallbladder fossa seen on the prior study are also less pronounced on today's examination, also favored to reflect treated metastatic lesions. No definite new hepatic lesions are noted on today's examination. There continues to be a vague area of arterial phase hyperenhancement in segment 4B of the liver (axial image 33 of series 2), stable compared to prior examinations, likely a benign perfusion anomaly. No intra or extrahepatic biliary ductal dilatation. Gallbladder is normal in appearance. Pancreas: Again noted is a poorly defined hypovascular mass in the head of the pancreas (axial image 39 of series 2 and coronal image 39 of series 3), estimated to measure approximately 2.2 x 2.1 x 2.1 cm on today's study. This is intimately associated with the superior mesenteric vein, splenoportal confluence and proximal portal vein, all of which appear markedly narrowed as they traverse adjacent to the lesion. There is a well-defined fat plane between the lesion and the adjacent superior mesenteric artery. Common hepatic artery also appears separate from the lesion. This lesion continues to obstruct the main pancreatic duct which is markedly dilated throughout the body and tail of the pancreas,  measuring up to 1.4 cm in diameter. New areas of low attenuation are noted throughout the distal body and tail of the pancreas, favored to represent either developing pancreatic pseudocysts or areas of increasing side branch ectasia, largest of which is noted in the region of the junction of distal body and tail (axial image 37 of series 2) measuring 1.7 x 1.1 cm. Soft tissue stranding is noted adjacent to the distal body and tail of the pancreas, suggesting probable acute pancreatitis. Spleen: Spleen is enlarged measuring 12.5 x 5.6 x 13.8 cm (estimated splenic volume  of 483 mL) . Adrenals/Urinary Tract: Bilateral kidneys and adrenal glands are normal in appearance. No hydroureteronephrosis. Urinary bladder is normal in appearance. Stomach/Bowel: Normal appearance of the stomach. No pathologic dilatation of small bowel or colon. Normal appendix. Vascular/Lymphatic: Aortic atherosclerosis, without evidence of aneurysm or dissection in the abdominal or pelvic vasculature. Vascular findings pertinent to pancreatic neoplasm, as detailed above. Multiple portosystemic collateral pathways are noted, most evident as gastric varices. No lymphadenopathy noted in the abdomen or pelvis. Reproductive: Prostate gland and seminal vesicles are unremarkable in appearance. Other: No significant volume of ascites.  No pneumoperitoneum. Musculoskeletal: There are no aggressive appearing lytic or blastic lesions noted in the visualized portions of the skeleton. IMPRESSION: 1. Today's study demonstrates positive response to therapy in terms of regression of numerous hypovascular hepatic lesions which likely represent hepatic metastases. 2. New pulmonary nodules seen on the prior study have regressed, favored to be indicative of benign infectious or inflammatory etiologies on the prior study. Old small pulmonary nodules are stable compared to the most recent prior examination, but new compared to more remote prior studies, likely to  represent treated stable metastatic lesions. 3. The primary pancreatic mass continues to obstruct the main pancreatic duct, with worsening side branch ectasia throughout the body and tail of the pancreas and/or developing pancreatic pseudocysts, with evidence of pancreatitis in these regions, as above. 4. The primary pancreatic mass continues to cause severe narrowing of the superior mesenteric vein, splenoportal confluence and proximal portal vein (which is nearly completely occluded). Associated with this are portosystemic collateral pathways, most notable for large gastric varices, and mild splenomegaly. 5. Aortic atherosclerosis, in addition to left main and 2 vessel coronary artery disease. 6. Additional incidental findings, as above. Electronically Signed   By: Vinnie Langton M.D.   On: 01/19/2021 09:24    PERFORMANCE STATUS (ECOG) : 1 - Symptomatic but completely ambulatory  Review of Systems  Constitutional:  Positive for appetite change and fatigue.  Gastrointestinal:  Positive for abdominal pain.  Unless otherwise noted, a complete review of systems is negative.  Physical Exam General: NAD, weak appearing Cardiovascular: Tachycardic  Pulmonary: clear ant fields Abdomen: soft, left-side and epigastric tenderness, hypo bowel sounds Extremities: no edema, no joint deformities Skin: no rashes Neurological: Weakness but otherwise nonfocal  IMPRESSION:  Mr. Hunzeker presents to the clinic today with his wife, Precious Bard for his initial visit with our palliative team for symptom management.  I introduced myself, Advertising copywriter, and Palliative's role in collaboration with the oncology team. Concept of Palliative Care was introduced as specialized medical care for people and their families living with serious illness.  It focuses on providing relief from the symptoms and stress of a serious illness.  The goal is to improve quality of life for both the patient and the family. Values and goals of care  important to patient and family were attempted to be elicited.   Mr. Keimig lives in the home with wife.  He currently still works however due to his health has decreased his hours.  Is ambulatory in the home.  Complains of increased abdominal pain despite current pain regimen (hydromorphone), nausea, dyspepsia, decreased appetite (has not eaten in 2 to 3 days), and generalized weakness.  On assessment patient is tender to touch even with stethoscope on left side and epigastric area.  Unable to palpate due to significant pain and discomfort.  1.  Neoplasm related pain Mr. Peschke is complaining of significant abdominal pain which is worsened over the past  3 days 4 days.  Rates pain 10/10.  States he is unable to eat due to feelings of fullness and sudden onset of pain when eating.  He is currently taking hydromorphone 2 mg every 4-6 hours as previously prescribed with little to no relief.  Given obvious signs of discomfort and uncontrolled pain will administer Dilaudid IV while in the clinic today.  During visit patient did not receive 0.5 mg of Dilaudid x2 with some relief.  Rates pain 4/10.  Given increased abdominal pain have requested stat abdominal CT with IV contrast.  This was approved to be of her prior authorization.  Exam completed and report because with acute findings of severe pancreatitis.  Discussed with Dr. Burr Medico.  Patient and wife updated regarding results with plans to have further evaluation and possible admission at [facility], ED.  Patient transported to the ED via wheelchair.  2.  Nausea He is complaining of intermittent nausea with increase episodes when attempting to eat.  Feels that Compazine controls nausea in the home however will generally return within 3-4 hours.  Discussed continued use of Compazine and Zofran ODT to better manage nausea.  Administered IV Zofran while in the clinic today.  3.  Fatigue Patient and wife complains of ongoing fatigue which is increased over the  past week.  He has experienced 1 fall in the past month but denies any injuries.  States he does not do much around the home and is trying his best to continue to work however has had to decrease hours and or work half days due to weakness/fatigue.  4.  Anorexia/weight loss Khang complains of ongoing anorexia which is worsened over the past week.  States when he does have a urged to eat he feels full immediately, has nausea, with significant abdominal pain.  Wife states he is barely eaten in the past 2-3 days.  Is trying to at least push fluids.  On 01/19/2021 patient's weight was 193.6 pounds weight today is 179.11 pounds.  We discussed Her current illness and what it means in the larger context of Her on-going co-morbidities. Natural disease trajectory and expectations were discussed.  Ammaar and his wife are aware of the severity of his illness.  They are remaining hopeful for some stability and treatment options to continue.  I discussed the importance of continued conversation with family and their medical providers regarding overall plan of care and treatment options, ensuring decisions are within the context of the patients values and GOCs.  PLAN: Patient having worsening abdominal pain, nausea, and decreased appetite.  1 L IV fluids, 0.5 mg hydromorphone x2, Pepcid, IV Zofran all administered while in the clinic today. CT abd/pelvis today showing acute pancreatitis. Discussed results with Dr. Burr Medico. Patient taken to ED via wheelchair for further work-up and possible admission.  I will plan to see patient back in 1-2 weeks in collaboration to other oncology appointments and hospital follow-up.    Patient expressed understanding and was in agreement with this plan. He also understands that He can call the clinic at any time with any questions, concerns, or complaints.   Time Total: 115 min.   Visit consisted of counseling and education dealing with the complex and emotionally intense issues  of symptom management and palliative care in the setting of serious and potentially life-threatening illness.Greater than 50%  of this time was spent counseling and coordinating care related to the above assessment and plan.  Signed by: Alda Lea, AGPCNP-BC Palliative Medicine Team

## 2021-02-12 NOTE — Assessment & Plan Note (Signed)
Obtain anemia panel and monitor Appreciate oncology consult

## 2021-02-12 NOTE — Assessment & Plan Note (Addendum)
-   Order Sensitive  SSI   - decreased home insulin regimen    Lantus 5 units,  -  check TSH and HgA1C

## 2021-02-12 NOTE — Assessment & Plan Note (Signed)
Supportive measures 

## 2021-02-12 NOTE — H&P (Signed)
Timothy Page MWN:[id number] DOB: [birth year] DOA: 02/12/2021     PCP: [name], MD   Outpatient Specialists:   CARDS:   Dr.Hilty,    Oncology  Dr.Iruku [facility] Gatha Mayer, MD Urology Dr. Gloriann Loan,  Endocrinology Elayne Snare, MD Patient arrived to ER on 02/12/21 at 1621 Referred by Attending Daleen Bo, MD   Patient coming from: home Lives  With family    Chief Complaint:   Chief Complaint  Patient presents with   Abdominal Pain    HPI: Timothy Page is a 65 y.o. 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 He has been having significant abdominal pain for past 3 to 4 days 10 out of 10 worse with eating he has been trying to use Dilaudid 2 mg every 4 hours but it does not seem to help.  He presented to cancer center today and received Dilaudid IV while in clinic. Also reports some nausea and fatigue.   No fever no chills no bleeding problems Has  NOt been vaccinated against COVID  no flu shot   Initial COVID TEST  NEGATIVE   Lab Results  Component Value Date   [place] 02/12/2021   [place] NEGATIVE 07/13/2020   [place] NEGATIVE 06/15/2020   SARSCOV2NAA RESULT: NEGATIVE 01/22/2020     Regarding pertinent Chronic problems:    Pancreatic cancer - (currently on second line treatment with gemcitabine  bad reaction to XelodaXeloda Followed by oncology   HTN on ramipril      DM 2 -  Lab Results  Component Value Date   HGBA1C 8.3 (A) 08/10/2020   on insulin,     Hypothyroidism:  Lab Results  Component Value Date   TSH 0.437 01/05/2021    on synthroid       Hx of CVA - *with/out residual deficits on Aspirin 81 mg, 325, Plavix   Hx of DVT hepatic on - anticoagulation with  Eliquis,      Chronic anemia - baseline hg Hemoglobin & Hematocrit  Recent Labs    01/19/21 1218 02/02/21 1219 02/12/21 1733  HGB 11.4* 12.3* 10.5*     While in ER: Clinical Course as of 02/12/21 1846  Fri Feb 12, 2021  1813 Patient discussed with oncology.  Agrees with pain control and medicine admission. [LJ]    Clinical Course User Index [LJ] Rayna Sexton, PA-C     CTabd/pelvis -  locally advanced pancreatic neoplasm which obstructs the portal venous system at the splenic portal confluence. Irregular dilated main pancreatic duct gives way to ovoid areas of low attenuation surrounded by inflammation in the midst of remaining parenchyma in the pancreatic tail. Findings are compatible with acute interstitial edematous pancreatitis likely with dilated side branches.    Following Medications were ordered in ER: Medications  HYDROmorphone (DILAUDID) injection 1 mg (1 mg Intravenous Given 02/12/21 1744)    _______________________________________________________ ER Provider Called:  Oncology      They Recommend admit to medicine   Will see in AM    ED Triage Vitals  Enc Vitals Group     BP 02/12/21 1632 [name])  172/94     Pulse Rate 02/12/21 1632 83     Resp 02/12/21 1632 18     Temp 02/12/21 1632 98 F (36.7 C)     Temp src --      SpO2 02/12/21 1632 100 %     Weight 02/12/21 1633 179 lb 11.2 oz (81.5 kg)     Height 02/12/21 1633 5\' 9"  (1.753 m)     Head Circumference --      Peak Flow --      Pain Score 02/12/21 1633 5     Pain Loc --      Pain Edu? --      Excl. in [place]? --   TMAX(24)@     _________________________________________ Significant initial  Findings: Abnormal Labs Reviewed  COMPREHENSIVE METABOLIC PANEL - Abnormal; Notable for the following components:      Result Value   CO2 21 (*)    Glucose, Bld 124 (*)     Calcium 8.4 (*)    AST 14 (*)    All other components within normal limits  CBC WITH DIFFERENTIAL/PLATELET - Abnormal; Notable for the following components:   WBC 2.7 (*)    RBC 3.35 (*)    Hemoglobin 10.5 (*)    HCT 31.1 (*)    Platelets 93 (*)    Neutro Abs 1.4 (*)    All other components within normal limits     ECG: Ordered     The recent clinical data is shown below. Vitals:   02/12/21 1632 02/12/21 1633 02/12/21 1730 02/12/21 1830  BP: (!) 172/94  (!) 169/98 (!) 158/101  Pulse: 83  78 82  Resp: 18  18 18   Temp: 98 F (36.7 C)     SpO2: 100%  100% 96%  Weight:  81.5 kg    Height:  5\' 9"  (1.753 m)     WBC     Component Value Date/Time   WBC 2.7 (L) 02/12/2021 1733   LYMPHSABS 0.9 02/12/2021 1733   MONOABS 0.3 02/12/2021 1733   EOSABS 0.0 02/12/2021 1733   BASOSABS 0.0 02/12/2021 1733     Results for orders placed or performed during the hospital encounter of 02/12/21  Resp Panel by RT-PCR (Flu A&B, Covid) Nasopharyngeal Swab     Status: None   Collection Time: 02/12/21  5:49 PM   Specimen: Nasopharyngeal Swab; Nasopharyngeal(NP) swabs in vial transport medium  Result Value Ref Range Status   SARS Coronavirus 2 by RT PCR NEGATIVE NEGATIVE Final         Influenza A by PCR NEGATIVE NEGATIVE Final   Influenza B by PCR NEGATIVE NEGATIVE Final        _______________________________________________ Hospitalist was called for admission for pancratitis  The following Work up has been ordered so far:  Orders Placed This Encounter  Procedures   Resp Panel by RT-PCR (Flu A&B, Covid) Nasopharyngeal Swab   Comprehensive metabolic panel   CBC with Differential   Lipase, blood   Consult to oncology   Consult to hospitalist     OTHER Significant initial  Findings:  labs showing:    Recent Labs  Lab 02/12/21 1733 02/12/21 2000  NA 135  --   K 3.8  --   CO2 21*  --   GLUCOSE 124*  --   BUN 13  --   CREATININE 0.87  --   CALCIUM 8.4*  --   MG  --  2.0   PHOS  --  4.1  Cr   stable,    Lab Results  Component Value Date   CREATININE 0.87 02/12/2021   CREATININE 1.06 02/02/2021   CREATININE 0.93 01/19/2021    Recent Labs  Lab 02/12/21 1733  AST 14*  ALT 12  ALKPHOS 75  BILITOT 0.6  PROT 6.8  ALBUMIN 3.8   Lab Results  Component Value Date   CALCIUM 8.4 (L) 02/12/2021   PHOS 4.1 02/12/2021          Plt: Lab Results  Component Value Date   PLT 93 (L) 02/12/2021      COVID-19 Labs  Recent Labs    02/12/21 2000  FERRITIN 212    Lab Results  Component Value Date   SARSCOV2NAA NEGATIVE 07/13/2020   SARSCOV2NAA NEGATIVE 06/15/2020   SARSCOV2NAA RESULT: NEGATIVE 01/22/2020        Recent Labs  Lab 02/12/21 1733  WBC 2.7*  NEUTROABS 1.4*  HGB 10.5*  HCT 31.1*  MCV 92.8  PLT 93*    HG/HCT  stable,     Component Value Date/Time   HGB 10.5 (L) 02/12/2021 1733   HGB 11.6 (L) 01/05/2021 1028   HCT 31.1 (L) 02/12/2021 1733   MCV 92.8 02/12/2021 1733     Recent Labs  Lab 02/12/21 1733  LIPASE 21      Cardiac Panel (last 3 results) Recent Labs    02/12/21 2000  CKTOTAL 53      DM  labs:  HbA1C: Recent Labs    06/12/20 0926 08/10/20 1530  HGBA1C 7.7* 8.3*       CBG (last 3)  No results for input(s): GLUCAP in the last 72 hours.        Cultures:    Component Value Date/Time   SDES BLOOD LEFT ANTECUBITAL 07/14/2020 0014   SPECREQUEST  07/14/2020 0014    BOTTLES DRAWN AEROBIC AND ANAEROBIC Blood Culture adequate volume   CULT  07/14/2020 0014    NO GROWTH 5 DAYS Performed at [facility], [place] [address] [place], [place] [address]    REPTSTATUS 07/19/2020 FINAL 07/14/2020 0014     Radiological Exams on Admission: CT ABDOMEN PELVIS W CONTRAST  Result Date: 02/12/2021 CLINICAL DATA:  Pancreatic cancer with acute abdominal pain. Mid abdominal pain since diagnosis now worsening since 02/08/2021. Also reportedly with nausea constipation. EXAM: CT ABDOMEN AND PELVIS WITH  CONTRAST TECHNIQUE: Multidetector CT imaging of the abdomen and pelvis was performed using the standard protocol following bolus administration of intravenous contrast. CONTRAST:  61mL OMNIPAQUE IOHEXOL 350 MG/ML SOLN COMPARISON:  November 02, 2020 also compared with January 18, 2021. FINDINGS: Lower chest: Port-A-Cath terminates in the distal superior vena cava. Lung bases are clear. No effusion. No consolidative changes. Hepatobiliary: Subtle peripheral foci of arterial phase enhancement without washout particularly about the gallbladder fossa no substantial change compared to previous imaging. Hypodense area along the gallbladder fossa (image 52/7) 15 mm stable since the December 5th evaluation. Hypodensity anterior to the porta hepatis stable since December 5th as well. 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 No pericholecystic stranding or intrahepatic biliary duct distension. Pancreas: Pancreatic head mass obstructing the main pancreatic duct is difficult to separate from numerous areas of low attenuation about the pancreatic parenchyma that have developed surrounding ductal dilation that was seen on more remote studies. 2.5 cm area is suspected in the head of the pancreas obstructing the  duct. Nodularity in the transverse mesocolon. Areas of low attenuation in the pancreatic tail have increased in size, for instance (image 40/7) 18 mm as compared to 14 mm. Other areas of low attenuation are minimally increased. Spleen: Normal. Adrenals/Urinary Tract: Insert normal renal contrast Stomach/Bowel: Abundant collateral venous structures about the stomach related to occluded splenic portal confluence. Engorgement of the gastro colic trunk and gastroepiploic veins. This is similar to prior studies. 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 Inflammation contacts the posterior wall of the gastric antrum.  No sign of bowel obstruction. The appendix is normal. Vascular/Lymphatic: Aortic atherosclerosis. No sign of aneurysm. Smooth contour of the IVC. There is no gastrohepatic or hepatoduodenal ligament lymphadenopathy. No retroperitoneal or mesenteric lymphadenopathy. No pelvic sidewall lymphadenopathy. Reproductive: Unremarkable. Other: No free intraperitoneal air. No overt peritoneal nodularity. Trace ascites. This is present only in the pelvis Musculoskeletal: No acute bone finding. No destructive bone process. Spinal degenerative changes. 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 Irregular dilated main pancreatic duct gives way to ovoid areas of low attenuation surrounded by inflammation in the midst of remaining parenchyma in the pancreatic tail. Findings are compatible with acute interstitial edematous pancreatitis likely with dilated side branches. It would be difficult to exclude concomitant worsening of pancreatic neoplasm in the current setting. What can be seen in the pancreatic head is very similar to previous imaging. Decreasing size of low-attenuation areas in the liver, areas along the gallbladder fossa and anterior to porta hepatis are compatible with focal fat deposition the other area seen inferior to these areas along hepatic subsegment VI based on appearance and evolution is also more likely to represent focal fat. Consider attention on follow-up or a differentiation would change management PET or MRI could be considered as discussed previously. These results will be called to the ordering clinician or representative by the Radiologist Assistant, and communication documented in the PACS or [employer]. Electronically Signed   By: Zetta Bills M.D.   On: 02/12/2021 15:19    _______________________________________________________________________________________________________ Latest  Blood pressure (!) 158/101, pulse 82, temperature 98 F (36.7 C), resp. rate 18, height 5\' 9"  (1.753 m), weight 81.5 kg, SpO2 96 %.   Vitals  labs and radiology finding personally reviewed  Review of Systems:    Pertinent positives include:  fatigue,    Constitutional:  No weight loss, night sweats, Fevers, chills, weight loss  HEENT:  No headaches, Difficulty swallowing,Tooth/dental problems,Sore throat,  No sneezing, itching, ear ache, nasal congestion, post nasal drip,  Cardio-vascular:  No chest pain, Orthopnea, PND, anasarca, dizziness, palpitations.no Bilateral lower extremity swelling  GI:  No heartburn, indigestion, abdominal pain, nausea, vomiting, diarrhea, change in bowel habits, loss of appetite, melena, blood in stool, hematemesis Resp:  no shortness of breath at rest. No dyspnea on exertion, No excess mucus, no productive cough, No non-productive cough, No coughing up of blood.No change in color of mucus.No wheezing. Skin:  no rash or lesions. No jaundice GU:  no dysuria, change in color of urine, no urgency or frequency. No straining to urinate.  No flank pain.  Musculoskeletal:  No joint pain or no joint swelling. No decreased range of motion. No back pain.  Psych:  No change in mood or affect. No depression or anxiety. No memory loss.  Neuro: no localizing neurological complaints, no tingling, no weakness, no double vision, no gait abnormality, no slurred speech, no confusion  All systems reviewed and apart from [place] all are negative _______________________________________________________________________________________________ Past Medical History:   Past Medical History:  Diagnosis Date   Arthritis    Atypical chest pain 04/2017   ACS ruled out 05/11/17   CAP (community acquired pneumonia) 05/07/2017   Chronic constipation    at least partially  d/t chronic opioids   Diabetes mellitus without complication ([place])    Poor control starting fall 2021-->GAD ab neg.  Insulin and C peptide levels wnl but on lower end of normal->Dr Kuma/endo 2022.   Exocrine pancreatic insufficiency 2022   History of thyroidectomy, total 2018   Hurlthe cell adenoma   Hx of adenomatous polyp of colon 07/02/2009   rpt colonoscopy 2026   Hyperlipidemia    Intol of multiple statins and zetia. [facility] lipid clinic 2022.   Hypertension    Lipoma    back   Myocardial infarction [facility])    MI at age 39   Pancreatic adenocarcinoma ([place]) 06/2020   Mets to liver (?and lungs?->stage 4), w/acute pancreatitis.   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 Cammy Copa   Right ureteral calculus 04/2018   Cystoscopy with right retrograde pyelogram and right ureteral stent placement after stone removal.   Seasonal allergies    Stroke ([place])    in 41P no complications    Tachyarrhythmia 1980   age 37 X 2 over 82 month period.  No recurrence since that time.   Thoracic aortic aneurysm    Incidentally discovered, 4.1 cm-->Dr. Cyndia Bent. Rpt CT 08/2018 and 08/2019 stable.   Vocal cord paralysis 07/2016   Left vocal cord paralysis noted after thyroid surgery.      Past Surgical History:  Procedure Laterality Date   BIOPSY  06/18/2020   Procedure: BIOPSY;  Surgeon: Rush Landmark Telford Nab., MD;  Location: [facility];  Service: Gastroenterology;;   [place] W/ POLYPECTOMY  07/02/2009; 01/24/20   2011 adenoma.  2021 adenomas x 3->recall 2026   CYSTOSCOPY WITH RETROGRADE PYELOGRAM, URETEROSCOPY AND STENT PLACEMENT Right 05/09/2018   Procedure: CYSTOSCOPY WITH RIGHT RETROGRADE RIGHT URETEROSCOPY STONE EXTRACTION RIGHT STENT  EXCHANGE;  Surgeon: Lucas Mallow, MD;  Location: [facility] ORS;  Service: Urology;  Laterality: Right;   CYSTOSCOPY/RETROGRADE/URETEROSCOPY/STONE EXTRACTION WITH BASKET Right 04/30/2018   Procedure: CYSTOSCOPY/RETROGRADE/RIGHT URETEROSCOPY/;  Surgeon: Lucas Mallow, MD;  Location: [facility] ORS;  Service: Urology;  Laterality: Right;   ESOPHAGOGASTRODUODENOSCOPY  07/02/2009   NORMAL   ESOPHAGOGASTRODUODENOSCOPY (EGD) WITH PROPOFOL N/A 06/18/2020   Procedure: ESOPHAGOGASTRODUODENOSCOPY (EGD) WITH PROPOFOL;  Surgeon: Rush Landmark Telford Nab., MD;  Location: [facility];  Service: Gastroenterology;  Laterality: N/A;   EYE SURGERY     FINE NEEDLE ASPIRATION  06/18/2020   Procedure: FINE NEEDLE ASPIRATION (FNA) LINEAR;  Surgeon: Irving Copas., MD;  Location: [facility];  Service: Gastroenterology;;   finger amputaion     with reattachement   INCISION AND DRAINAGE ABSCESS ANAL  11/23/2019   gen surg (In ED).   IR IMAGING GUIDED PORT INSERTION  06/22/2020   LASIK     THYROIDECTOMY N/A 08/03/2016   Procedure: Total THYROIDECTOMY;  Surgeon: [name], MD;  Location: [facility];  Service: ENT;  Laterality: N/A;  Total Thyroidectomy    UPPER ESOPHAGEAL ENDOSCOPIC ULTRASOUND (EUS) N/A  06/18/2020   Procedure: UPPER ESOPHAGEAL ENDOSCOPIC ULTRASOUND (EUS);  Surgeon: Irving Copas., MD;  Location: [facility];  Service: Gastroenterology;  Laterality: N/A;   UPPER GASTROINTESTINAL ENDOSCOPY     with EUS and bx of pancreatic mass. +appearance of candida in esoph.      Social History:  Ambulatory   independently      reports that he has never smoked. His smokeless tobacco use includes snuff. He reports that he does not drink alcohol and does not use drugs.     Family History:   Family History  Problem Relation Age of Onset   Diabetes Mother    Cancer Mother        lung   COPD Mother    Brain cancer Mother    Arthritis Mother    Hyperlipidemia Mother    Heart disease Mother     Hypertension Mother    Diabetes Father    Cancer Father        mesothelioma; skin   Mental illness Father    Arthritis Father    Hyperlipidemia Father    Heart disease Father    Hypertension Father    Heart disease Maternal Grandmother        MI @ 80   Heart disease Maternal Grandfather        MI @ 84   Diabetes Paternal Grandmother    Dementia Paternal Grandmother    Heart disease Paternal Grandfather        MI @ 69   Colon cancer Neg Hx    Esophageal cancer Neg Hx    Rectal cancer Neg Hx    Stomach cancer Neg Hx    ______________________________________________________________________________________________ Allergies: Allergies  Allergen Reactions   Atenolol     Severe "slowing"of functioning Severe "slowing"of functioning   Hydroxyzine Other (See Comments)    "felt like I was coming out of my skin"   Tetracycline     Rash Because of a history of documented adverse serious drug reaction;Medi Alert bracelet  is recommended Rash Because of a history of documented adverse serious drug reaction;Medi Alert bracelet  is recommended   Atorvastatin     D/Ced by him due to Myalgias in Sept 2014 D/Ced by him due to Myalgias in Sept 2014   Codeine     Hallucinations. "I see pink elephants"   Guaifenesin     Other reaction(s): Other (See Comments) unknown unknown   Mucinex [Guaifenesin Er] Other (See Comments)    Hallucinations "I see pink elephants"     Prior to Admission medications   Medication Sig Start Date End Date Taking? Authorizing Provider  apixaban (ELIQUIS) 5 MG TABS tablet TAKE 1 TABLET(5 MG) BY MOUTH TWICE DAILY 01/05/21   Truitt Merle, MD  capecitabine (XELODA) 500 MG tablet TAKE 3 TABLETS BY MOUTH EVERY 12 HOURS - TAKE WITHIN 30 MINUTES  AFTER MEALS FOR 7 DAYS ON, THEN  7 DAYS OFF, REPEAT EVERY 14 DAYS Patient not taking: Reported on 02/12/2021 02/09/21   Truitt Merle, MD  Continuous Blood Gluc Sensor (FREESTYLE LIBRE 2 SENSOR) MISC APPLY 1 SENSOR TO THE SKIN  EVERY 14 DAYS AS DIRECTED 01/14/21   Elayne Snare, MD  docusate sodium (COLACE) 100 MG capsule Take 100 mg by mouth 2 (two) times daily.    [provider]  gabapentin (NEURONTIN) 100 MG capsule Take 1 capsule (100 mg total) by mouth 3 (three) times daily. Take during day, in additional to 300mg  capsules at evening Patient not taking:  Reported on 02/12/2021 01/19/21   Truitt Merle, MD  gabapentin (NEURONTIN) 300 MG capsule Take 1-2 capsules (300-600 mg total) by mouth 3 (three) times daily. Patient not taking: Reported on 02/12/2021 01/05/21   Truitt Merle, MD  HUMALOG KWIKPEN 100 UNIT/ML KwikPen Inject 20-22 Units into the skin daily. 07/23/20   [provider]  HYDROmorphone (DILAUDID) 2 MG tablet Take 1 tablet (2 mg total) by mouth every 6 (six) hours as needed for severe pain. 02/12/21   Pickenpack-Cousar, Carlena Sax, NP  insulin glargine (LANTUS SOLOSTAR) 100 UNIT/ML Solostar Pen Give up to 10 units under the skin every evening at bedtime. 06/22/20   Charlynne Cousins, MD  Insulin Pen Needle 31G X 5 MM MISC Use with insulin pen 3 times a day 04/24/20   Elayne Snare, MD  lactulose (CHRONULAC) 10 GM/15ML solution Take 15 mLs (10 g total) by mouth 3 (three) times daily as needed for mild constipation. 10/06/20   [name], MD  levothyroxine (SYNTHROID) 137 MCG tablet TAKE 1 TABLET BY MOUTH  DAILY BEFORE BREAKFAST 06/03/20   Elayne Snare, MD  lipase/protease/amylase (CREON) 36000 UNITS CPEP capsule Take 2 capsules (72,000 Units total) by mouth 3 (three) times daily with meals. May also take 1 capsule (36,000 Units total) as needed (with snacks). 06/30/20   [name], MD  LORazepam (ATIVAN) 0.5 MG tablet TAKE 1 TABLET(0.5 MG) BY MOUTH EVERY 12 HOURS AS NEEDED FOR ANXIETY 02/10/21   Truitt Merle, MD  magic mouthwash (nystatin, lidocaine, diphenhydrAMINE, alum & mag hydroxide) suspension Swish and swallow 5 mls by mouth every 6 hours as needed 01/25/21   Truitt Merle, MD  magnesium hydroxide (MILK  OF MAGNESIA) 400 MG/5ML suspension Take by mouth daily as needed for mild constipation.    [provider]  polyethylene glycol (MIRALAX / GLYCOLAX) 17 g packet Take 17 g by mouth daily.    [provider]  prochlorperazine (COMPAZINE) 10 MG tablet TAKE 1 TABLET(10 MG) BY MOUTH EVERY 6 HOURS AS NEEDED FOR NAUSEA OR VOMITING 02/01/21   Truitt Merle, MD  ramipril (ALTACE) 10 MG capsule Take 10 mg by mouth 2 (two) times daily.    [provider]  senna-docusate (SENOKOT-S) 8.6-50 MG tablet Take 1 tablet by mouth 2 (two) times daily.    [provider]    ___________________________________________________________________________________________________ Physical Exam: Vitals with BMI 02/12/2021 02/12/2021 02/12/2021  Height - - 5\' 9"   Weight - - 179 lbs 11 oz  BMI - - 80.99  Systolic 833 825 -  Diastolic 053 98 -  Pulse 82 78 -     1. General:  in No  Acute distress   Chronically ill   -appearing 2. Psychological: Alert and  Oriented 3. Head/ENT:    Dry Mucous Membranes                          Head Non traumatic, neck supple                           Poor Dentition 4. SKIN:  decreased Skin turgor,  Skin clean Dry and intact no rash 5. Heart: Regular rate and rhythm no  Murmur, no Rub or gallop 6. Lungs:  Clear to auscultation bilaterally, no wheezes or crackles   7. Abdomen: Soft, epigastrictender, Non distended  bowel sounds present 8. Lower extremities: no clubbing, cyanosis, no  edema 9. Neurologically Grossly intact, moving all 4  extremities equally   10. MSK: Normal range of motion    Chart has been reviewed  ______________________________________________________________________________________________  Assessment/Plan  65 y.o. male with medical history significant of stage IV pancreatic cancer with mets to lungs and liver, chemo induced neuropathy, DM2, hypothyroidism, anxiety, HLD, HTN, CVA, hepatic clot on xarelto    Admitted for   pancreatitis  Present on Admission:  Pancreatitis  Nausea without vomiting  Essential hypertension  Thrombocytopenia (HCC)  Postoperative hypothyroidism  Mixed hyperlipidemia  Anemia associated with chemotherapy     Pancreatitis most likely in the setting of pancreatic ca inflammation vs cancer related pain CT of abdomen showing acute pancreatitis  no evidence of pseudocyst Will rehydrate with aggressive IV fluids Keep n.p.o. Follow clinically    -Check lipid panel - Discontinue offending medications ( ACEi) Control pain with IV pain medications If no significant improvement in the next 24 hours may benefit from GI consult     Nausea without vomiting Supportive measures  Essential hypertension Hold BP meds for tonight allow permissive hypertension and monitor  Controlled type 2 diabetes mellitus without complication, without long-term current use of insulin (HCC)  - Order Sensitive  SSI   - decreased home insulin regimen    Lantus 5 units,  -  check TSH and HgA1C      Thrombocytopenia (HCC) Continue to follow this is possibly drug-induced.  Appreciate hematology consult  Postoperative hypothyroidism - Check TSH continue home medications at current dose   Mixed hyperlipidemia Check cholesterol panel  Anemia associated with chemotherapy Obtain anemia panel and monitor Appreciate oncology consult    Other plan as per orders.  DVT prophylaxis:  eliquis    Code Status:    Code Status: DNR   DNR/DNI  e as per patient   I had personally discussed CODE STATUS with patient and family     Family Communication:   Family   at  Bedside  plan of care was discussed   with   Wife,    Disposition Plan:     To home once workup is complete and patient is stable   Following barriers for discharge:                            Electrolytes corrected                                                            Pain controlled with PO medications                                                           Will need consultants to evaluate patient prior to discharge                       Consults called: oncology is aware  Admission status:  ED Disposition     ED Disposition  Admit   Condition  --   Comment  The patient appears reasonably stabilized for admission considering the current resources, flow, and capabilities available in the ED at this time, and I  doubt any other EMC requiring further screening and/or treatment in the ED prior to admission is  present.          inpatient     I Expect 2 midnight stay secondary to severity of patient's current illness need for inpatient interventions justified by the following:    Severe lab/radiological/exam abnormalities including:    pancretitis and extensive comorbidities including:  DM2   malignancy,   Chronic anticoagulation  That are currently affecting medical management.   I expect  patient to be hospitalized for 2 midnights requiring inpatient medical care.  Patient is at high risk for adverse outcome (such as loss of life or disability) if not treated.  Indication for inpatient stay as follows:     severe pain requiring acute inpatient management,  inability to maintain oral hydration      Need for  IV fluids,  IV pain medications,      Level of care     tele  For 12H     Lab Results  Component Value Date   [place] 07/13/2020     Precautions: admitted as   Covid Negative         Doloris Servantes 02/12/2021, 9:45 PM    [facility]     after 2 AM please page floor coverage PA If 7AM-7PM, please contact the day team taking care of the patient using [email or web address]   Patient was evaluated in the context of the global COVID-19 pandemic, which necessitated consideration that the patient might be at risk for infection with the SARS-CoV-2 virus that causes COVID-19. Institutional protocols and algorithms that pertain to the evaluation of patients at  risk for COVID-19 are in a state of rapid change based on information released by regulatory bodies including the CDC and federal and state organizations. These policies and algorithms were followed during the patient's care.

## 2021-02-12 NOTE — Assessment & Plan Note (Signed)
Continue to follow this is possibly drug-induced.  Appreciate hematology consult

## 2021-02-12 NOTE — Assessment & Plan Note (Signed)
most likely in the setting of pancreatic ca inflammation vs cancer related pain CT of abdomen showing acute pancreatitis  no evidence of pseudocyst Will rehydrate with aggressive IV fluids Keep n.p.o. Follow clinically    -Check lipid panel - Discontinue offending medications ( ACEi) Control pain with IV pain medications If no significant improvement in the next 24 hours may benefit from GI consult

## 2021-02-12 NOTE — ED Provider Notes (Signed)
[facility] DEPT Provider Note   CSN: [id number] Arrival date & time: 02/12/21  1621     History Chief Complaint  Patient presents with   Abdominal Pain    Timothy Page is a 65 y.o. male.  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  Stat CT scan of the abdomen/pelvis with contrast was ordered this morning at the cancer center with findings as noted below in the MDM.  Patient states that his symptoms have been worsening for the past week.  States the pain is diffuse across his abdomen but worst along the left lateral abdomen.  Nausea without vomiting.  Also notes decreased appetite.  Records indicate patient's weight was 193.6 on 12/6 and today is 179.11.  Reports chronic constipation and typically has a bowel movement every 3 to 4 days.  States had a normal bowel movement this morning.  Denies hematochezia.  No other complaints.     Past Medical History:  Diagnosis Date   Arthritis    Atypical chest pain 04/2017   ACS ruled out 05/11/17   CAP (community acquired pneumonia) 05/07/2017   Chronic constipation    at least partially d/t chronic opioids   Diabetes mellitus without complication ([place])    Poor control starting fall 2021-->GAD ab neg.  Insulin and C peptide levels wnl but on lower end of normal->Dr Kuma/endo 2022.   Exocrine pancreatic insufficiency 2022   History of thyroidectomy, total 2018   Hurlthe cell adenoma   Hx of adenomatous polyp of colon 07/02/2009   rpt colonoscopy 2026   Hyperlipidemia    Intol of multiple statins and zetia. [facility] 2022.   Hypertension    Lipoma    back   Myocardial infarction [facility])    MI at age 79   Pancreatic adenocarcinoma ([place]) 06/2020   Mets to liver (?and  lungs?->stage 4), w/acute pancreatitis.   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 Cammy Copa   Right ureteral calculus 04/2018   Cystoscopy with right retrograde pyelogram and right ureteral stent placement after stone removal.   Seasonal allergies    Stroke ([place])    in 40J no complications    Tachyarrhythmia 1980   age 7 X 2 over 57 month period.  No recurrence since that time.   Thoracic aortic aneurysm    Incidentally discovered, 4.1 cm-->Dr. Cyndia Bent. Rpt CT 08/2018 and 08/2019 stable.   Vocal cord paralysis 07/2016   Left vocal cord paralysis noted after thyroid surgery.    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[place]) 12/24/2020   Neuropathy due to chemotherapeutic drug ([place]) 11/25/2020   Thrombocytopenia ([place]) 10/06/2020   Drug induced constipation 08/31/2020   Portal vein thrombosis 08/31/2020   Port-A-Cath in place 08/20/2020   Insomnia 07/22/2020   Colitis 07/13/2020   Diabetes ([place]) 07/13/2020   Hyponatremia 07/13/2020   Genetic testing 07/02/2020   Heart burn 06/30/2020   Cancer related pain 06/30/2020   Adverse drug interaction with prescription medication  06/30/2020   Weight loss, unintentional 06/30/2020   Adenocarcinoma of pancreas, stage 4 ([place]) 06/20/2020   Dilation of pancreatic duct    Nausea without vomiting    Acute pancreatitis 06/14/2020   Obesity (BMI 30.0-34.9) 08/31/2018   Adjustment disorder with mixed anxiety and depressed mood    Chest pain 05/11/2017   Anxiety about health 05/11/2017   Community acquired pneumonia 05/07/2017   Lung nodule    Abdominal aortic aneurysm (AAA) without rupture    Hyperlipidemia    Diffuse thyroid  goiter without thyrotoxicosis 08/12/2016   Vocal cord paralysis 08/12/2016   Goiter 07/31/2016   Cough 03/06/2015   Controlled type 2 diabetes mellitus without complication, without long-term current use of insulin ([place]) 08/09/2012   Essential hypertension 08/07/2012   Mixed hyperlipidemia 08/07/2012   CAD (coronary artery disease) 04/28/2011   Unspecified adverse effect of unspecified drug, medicinal and biological substance 04/28/2011   Lipoma 03/01/2011   Tremor 03/01/2011   Hx of adenomatous polyp of colon 07/02/2009   UNSPECIFIED PROSTATITIS 05/05/2009    Past Surgical History:  Procedure Laterality Date   BIOPSY  06/18/2020   Procedure: BIOPSY;  Surgeon: Irving Copas., MD;  Location: [facility];  Service: Gastroenterology;;   [place] W/ POLYPECTOMY  07/02/2009; 01/24/20   2011 adenoma.  2021 adenomas x 3->recall 2026   CYSTOSCOPY WITH RETROGRADE PYELOGRAM, URETEROSCOPY AND STENT PLACEMENT Right 05/09/2018   Procedure: CYSTOSCOPY WITH RIGHT RETROGRADE RIGHT URETEROSCOPY STONE EXTRACTION RIGHT STENT EXCHANGE;  Surgeon: Lucas Mallow, MD;  Location: [facility];  Service: Urology;  Laterality: Right;   CYSTOSCOPY/RETROGRADE/URETEROSCOPY/STONE EXTRACTION WITH BASKET Right 04/30/2018   Procedure: CYSTOSCOPY/RETROGRADE/RIGHT URETEROSCOPY/;  Surgeon: Lucas Mallow, MD;  Location: [facility];  Service: Urology;  Laterality: Right;   ESOPHAGOGASTRODUODENOSCOPY  07/02/2009   NORMAL   ESOPHAGOGASTRODUODENOSCOPY (EGD) WITH PROPOFOL N/A 06/18/2020   Procedure: ESOPHAGOGASTRODUODENOSCOPY (EGD) WITH PROPOFOL;  Surgeon: Rush Landmark Telford Nab., MD;  Location: [facility];  Service: Gastroenterology;  Laterality: N/A;   EYE SURGERY     FINE NEEDLE ASPIRATION  06/18/2020   Procedure: FINE NEEDLE ASPIRATION (FNA) LINEAR;  Surgeon: Irving Copas., MD;  Location: [facility];  Service: Gastroenterology;;   finger amputaion     with reattachement    INCISION AND DRAINAGE ABSCESS ANAL  11/23/2019   gen surg (In ED).   IR IMAGING GUIDED PORT INSERTION  06/22/2020   LASIK     THYROIDECTOMY N/A 08/03/2016   Procedure: Total THYROIDECTOMY;  Surgeon: [name], MD;  Location: [facility];  Service: ENT;  Laterality: N/A;  Total Thyroidectomy    UPPER ESOPHAGEAL ENDOSCOPIC ULTRASOUND (EUS) N/A 06/18/2020   Procedure: UPPER ESOPHAGEAL ENDOSCOPIC ULTRASOUND (EUS);  Surgeon: Irving Copas., MD;  Location: [facility];  Service: Gastroenterology;  Laterality: N/A;   UPPER GASTROINTESTINAL ENDOSCOPY     with EUS and bx of pancreatic mass. +appearance of candida in esoph.         Family History  Problem Relation Age of Onset   Diabetes Mother    Cancer Mother        lung   COPD Mother    Brain cancer Mother    Arthritis Mother    Hyperlipidemia Mother    Heart disease Mother    Hypertension Mother    Diabetes Father    Cancer Father        mesothelioma; skin   Mental illness Father    Arthritis Father    Hyperlipidemia  Father    Heart disease Father    Hypertension Father    Heart disease Maternal Grandmother        MI @ 99   Heart disease Maternal Grandfather        MI @ 44   Diabetes Paternal Grandmother    Dementia Paternal Grandmother    Heart disease Paternal Grandfather        MI @ 80   Colon cancer Neg Hx    Esophageal cancer Neg Hx    Rectal cancer Neg Hx    Stomach cancer Neg Hx     Social History   Tobacco Use   Smoking status: Never   Smokeless tobacco: Current    Types: Snuff  Vaping Use   Vaping Use: Never used  Substance Use Topics   Alcohol use: No   Drug use: No    Home Medications Prior to Admission medications   Medication Sig Start Date End Date Taking? Authorizing Provider  apixaban (ELIQUIS) 5 MG TABS tablet TAKE 1 TABLET(5 MG) BY MOUTH TWICE DAILY 01/05/21   Truitt Merle, MD  capecitabine (XELODA) 500 MG tablet TAKE 3 TABLETS BY MOUTH EVERY 12 HOURS - TAKE WITHIN 30 MINUTES  AFTER MEALS  FOR 7 DAYS ON, THEN  7 DAYS OFF, REPEAT EVERY 14 DAYS Patient not taking: Reported on 02/12/2021 02/09/21   Truitt Merle, MD  Continuous Blood Gluc Sensor (FREESTYLE LIBRE 2 SENSOR) MISC APPLY 1 SENSOR TO THE SKIN EVERY 14 DAYS AS DIRECTED 01/14/21   Elayne Snare, MD  docusate sodium (COLACE) 100 MG capsule Take 100 mg by mouth 2 (two) times daily.    [provider]  gabapentin (NEURONTIN) 100 MG capsule Take 1 capsule (100 mg total) by mouth 3 (three) times daily. Take during day, in additional to 300mg  capsules at evening Patient not taking: Reported on 02/12/2021 01/19/21   Truitt Merle, MD  gabapentin (NEURONTIN) 300 MG capsule Take 1-2 capsules (300-600 mg total) by mouth 3 (three) times daily. Patient not taking: Reported on 02/12/2021 01/05/21   Truitt Merle, MD  HUMALOG KWIKPEN 100 UNIT/ML KwikPen Inject 20-22 Units into the skin daily. 07/23/20   [provider]  HYDROmorphone (DILAUDID) 2 MG tablet Take 1 tablet (2 mg total) by mouth every 6 (six) hours as needed for severe pain. 02/12/21   Pickenpack-Cousar, Carlena Sax, NP  insulin glargine (LANTUS SOLOSTAR) 100 UNIT/ML Solostar Pen Give up to 10 units under the skin every evening at bedtime. 06/22/20   Charlynne Cousins, MD  Insulin Pen Needle 31G X 5 MM MISC Use with insulin pen 3 times a day 04/24/20   Elayne Snare, MD  lactulose (CHRONULAC) 10 GM/15ML solution Take 15 mLs (10 g total) by mouth 3 (three) times daily as needed for mild constipation. 10/06/20   [name], MD  levothyroxine (SYNTHROID) 137 MCG tablet TAKE 1 TABLET BY MOUTH  DAILY BEFORE BREAKFAST 06/03/20   Elayne Snare, MD  lipase/protease/amylase (CREON) 36000 UNITS CPEP capsule Take 2 capsules (72,000 Units total) by mouth 3 (three) times daily with meals. May also take 1 capsule (36,000 Units total) as needed (with snacks). 06/30/20   [name], MD  LORazepam (ATIVAN) 0.5 MG tablet TAKE 1 TABLET(0.5 MG) BY MOUTH EVERY 12 HOURS AS NEEDED FOR ANXIETY 02/10/21    Truitt Merle, MD  magic mouthwash (nystatin, lidocaine, diphenhydrAMINE, alum & mag hydroxide) suspension Swish and swallow 5 mls by mouth every 6 hours as needed 01/25/21   Truitt Merle, MD  magnesium hydroxide (MILK OF MAGNESIA) 400 MG/5ML suspension Take by mouth daily as needed for mild constipation.    [provider]  polyethylene glycol (MIRALAX / GLYCOLAX) 17 g packet Take 17 g by mouth daily.    [provider]  prochlorperazine (COMPAZINE) 10 MG tablet TAKE 1 TABLET(10 MG) BY MOUTH EVERY 6 HOURS AS NEEDED FOR NAUSEA OR VOMITING 02/01/21   Truitt Merle, MD  ramipril (ALTACE) 10 MG capsule Take 10 mg by mouth 2 (two) times daily.    [provider]  senna-docusate (SENOKOT-S) 8.6-50 MG tablet Take 1 tablet by mouth 2 (two) times daily.    [provider]    Allergies    Atenolol, Hydroxyzine, Tetracycline, Atorvastatin, Codeine, Guaifenesin, and Mucinex [guaifenesin er]  Review of Systems   Review of Systems  All other systems reviewed and are negative. Ten systems reviewed and are negative for acute change, except as noted in the HPI.   Physical Exam Updated Vital Signs BP (!) 169/98    Pulse 78    Temp 98 F (36.7 C)    Resp 18    Ht 5\' 9"  (1.753 m)    Wt 81.5 kg    SpO2 100%    BMI 26.54 kg/m   Physical Exam Vitals and nursing note reviewed.  Constitutional:      General: He is not in acute distress.    Appearance: Normal appearance. He is not ill-appearing, toxic-appearing or diaphoretic.  HENT:     Head: Normocephalic and atraumatic.     Right Ear: External ear normal.     Left Ear: External ear normal.     Nose: Nose normal.     Mouth/Throat:     Mouth: Mucous membranes are moist.     Pharynx: Oropharynx is clear. No oropharyngeal exudate or posterior oropharyngeal erythema.  Eyes:     Extraocular Movements: Extraocular movements intact.  Cardiovascular:     Rate and Rhythm: Normal rate and regular rhythm.     Pulses: Normal pulses.      Heart sounds: Normal heart sounds. No murmur heard.   No friction rub. No gallop.  Pulmonary:     Effort: Pulmonary effort is normal. No respiratory distress.     Breath sounds: Normal breath sounds. No stridor. No wheezing, rhonchi or rales.  Abdominal:     General: Abdomen is flat.     Tenderness: There is abdominal tenderness.     Comments: Abdomen is flat and soft.  Moderate tenderness noted diffusely across the abdomen that is worst in the left upper quadrant.  Musculoskeletal:        General: Normal range of motion.     Cervical back: Normal range of motion and neck supple. No tenderness.  Skin:    General: Skin is warm and dry.  Neurological:     General: No focal deficit present.     Mental Status: He is alert and oriented to person, place, and time.  Psychiatric:        Mood and Affect: Mood normal.        Behavior: Behavior normal.   ED Results / Procedures / Treatments   Labs (all labs ordered are listed, but only abnormal results are displayed) Labs Reviewed  RESP PANEL BY RT-PCR (FLU A&B, COVID) [id number]  COMPREHENSIVE METABOLIC PANEL  CBC WITH DIFFERENTIAL/PLATELET  LIPASE, BLOOD   EKG None  Radiology CT ABDOMEN PELVIS W CONTRAST  Result Date: 02/12/2021 CLINICAL DATA:  Pancreatic cancer with acute abdominal  pain. Mid abdominal pain since diagnosis now worsening since 02/08/2021. Also reportedly with nausea constipation. EXAM: CT ABDOMEN AND PELVIS WITH CONTRAST TECHNIQUE: Multidetector CT imaging of the abdomen and pelvis was performed using the standard protocol following bolus administration of intravenous contrast. CONTRAST:  9mL OMNIPAQUE IOHEXOL 350 MG/ML SOLN COMPARISON:  November 02, 2020 also compared with January 18, 2021. FINDINGS: Lower chest: Port-A-Cath terminates in the distal superior vena cava. Lung bases are clear. No effusion. No consolidative changes. Hepatobiliary: Subtle peripheral foci of arterial phase enhancement without washout particularly  about the gallbladder fossa no substantial change compared to previous imaging. Hypodense area along the gallbladder fossa (image 52/7) 15 mm stable since the December 5th evaluation. Hypodensity anterior to the porta hepatis stable since December 5th as well. 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 No pericholecystic stranding or intrahepatic biliary duct distension. Pancreas: Pancreatic head mass obstructing the main pancreatic duct is difficult to separate from numerous areas of low attenuation about the pancreatic parenchyma that have developed surrounding ductal dilation that was seen on more remote studies. 2.5 cm area is suspected in the head of the pancreas obstructing the duct. Nodularity in the transverse mesocolon. Areas of low attenuation in the pancreatic tail have increased in size, for instance (image 40/7) 18 mm as compared to 14 mm. Other areas of low attenuation are minimally increased. Spleen: Normal. Adrenals/Urinary Tract: Insert normal renal contrast Stomach/Bowel: Abundant collateral venous structures about the stomach related to occluded splenic portal confluence. Engorgement of the gastro colic trunk and gastroepiploic veins. This is similar to prior studies. 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 Inflammation contacts the posterior wall of the gastric antrum. No sign of bowel obstruction. The appendix is normal. Vascular/Lymphatic: Aortic atherosclerosis. No sign of aneurysm. Smooth contour of the IVC. There is no gastrohepatic or hepatoduodenal ligament lymphadenopathy. No retroperitoneal or mesenteric lymphadenopathy. No pelvic sidewall lymphadenopathy. Reproductive: Unremarkable. Other: No free intraperitoneal air. No overt peritoneal nodularity. Trace ascites. This is present only in the pelvis Musculoskeletal: No acute bone finding. No destructive bone process. Spinal  degenerative changes. 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 Irregular dilated main pancreatic duct gives way to ovoid areas of low attenuation surrounded by inflammation in the midst of remaining parenchyma in the pancreatic tail. Findings are compatible with acute interstitial edematous pancreatitis likely with dilated side branches. It would be difficult to exclude concomitant worsening of pancreatic neoplasm in the current setting. What can be seen in the pancreatic head is very similar to previous imaging. Decreasing size of low-attenuation areas in the liver, areas along the gallbladder fossa and anterior to porta hepatis are compatible with focal fat deposition the other area seen inferior to these areas along hepatic subsegment VI based on appearance and evolution is also more likely to represent focal fat. Consider attention on follow-up or a differentiation would change management PET or MRI could be considered as discussed previously. These results will be called to the ordering clinician or representative by the Radiologist Assistant, and communication documented in the PACS or [employer]. Electronically Signed   By: Zetta Bills M.D.   On: 02/12/2021 15:19    Procedures Procedures   Medications Ordered in ED Medications  HYDROmorphone (DILAUDID) injection 1 mg (1 mg Intravenous Given 02/12/21 1744)  ED Course  I have reviewed the triage vital signs and the nursing notes.  Pertinent labs & imaging results that were available during my care of the patient were reviewed by me and considered in my medical decision making (see chart for details).  Clinical Course as of 02/12/21 1816  Fri Feb 12, 2021  1813 Patient discussed with oncology.  Agrees with pain control and medicine admission. [LJ]    Clinical Course User Index [LJ] Rayna Sexton, PA-C   MDM Rules/Calculators/A&P                          Pt is a 65 y.o. male with a history of stage IV pancreatic cancer with mets to the lung and liver who presents to the emergency department due to worsening abdominal pain.  CT scan of the abdomen/pelvis obtained at the cancer center earlier today concerning for pancreatitis.  Findings noted below.  Labs: CBC, CMP, lipase, respiratory panel are pending.  Imaging: CT scan of the abdomen/pelvis with contrast shows IMPRESSION: Acute pancreatitis with slight worsening superimposed on locally advanced pancreatic neoplasm which obstructs the portal venous system at the splenic portal confluence. Irregular dilated main pancreatic duct gives way to ovoid areas of low attenuation surrounded by inflammation in the midst of remaining parenchyma in the pancreatic tail. Findings are compatible with acute interstitial edematous pancreatitis likely with dilated side branches. It would be difficult to exclude concomitant worsening of pancreatic neoplasm in the current setting. What can be seen in the pancreatic head is very similar to previous imaging. Decreasing size of low-attenuation areas in the liver, areas along the gallbladder fossa and anterior to porta hepatis are compatible with focal fat deposition the other area seen inferior to these areas along hepatic subsegment VI based on appearance and evolution is also more likely to represent focal fat. Consider attention on follow-up or a differentiation would change management PET or MRI could be considered as discussed previously. These results will be called to the ordering clinician or representative by the Radiologist Assistant, and communication documented in the PACS or [employer]. Electronically Signed   By: Zetta Bills M.D.   On: 02/12/2021 15:19    I, Rayna Sexton, PA-C, personally reviewed and evaluated these images and lab results as part of my medical  decision-making.  Patient given IV Dilaudid at the cancer center prior to arrival.  Still notes moderate pain.  Pain is about 5/10 currently.  Will order additional Dilaudid.  Will obtain basic labs including a lipase and respiratory panel.  Patient discussed with oncology who agree with medicine admission.  This was discussed with the patient as well and he is amenable.  We will discuss with the medicine team at this time.  Note: Portions of this report may have been transcribed using voice recognition software. Every effort was made to ensure accuracy; however, inadvertent computerized transcription errors may be present.   Final Clinical Impression(s) / ED Diagnoses Final diagnoses:  Acute pancreatitis, unspecified complication status, unspecified pancreatitis type  Malignant neoplasm of pancreas, unspecified location of malignancy [facility])   Rx / DC Orders ED Discharge Orders     None        Rayna Sexton, PA-C 02/12/21 1903    Daleen Bo, MD 02/13/21 [phone]

## 2021-02-12 NOTE — ED Provider Notes (Signed)
°  Face-to-face evaluation   History: Presents to the ED after being evaluated CT imaging, of the abdomen pelvis.  CT showed pancreatitis.  Physical exam: Alert elderly male.  No dysarthria or aphasia.  No respiratory distress.  He is tolerating his oral secretions.  MDM: Evaluation for  Chief Complaint  Patient presents with   Abdominal Pain     He has upper abdominal pain for about a week, following last chemotherapy, 10 days ago.  Evaluation today with outpatient imaging showed acute pancreatitis.  No obstructive pattern on CT imaging.  This is an unusual appearance in a patient who does not have prior episodes of pancreatitis.  It is most likely chemotherapy-induced.  He does not have specific signs or symptoms of infection causing pancreatitis.  He will require hospitalization for management of his symptoms and gut rest.  Medical screening examination/treatment/procedure(s) were conducted as a shared visit with non-physician practitioner(s) and myself.  I personally evaluated the patient during the encounter    Daleen Bo, MD 02/13/21 [phone]

## 2021-02-12 NOTE — ED Triage Notes (Signed)
Pt sent over from cancer center, hx of pancreatic cancer,. Complain of abd pain x 1 week. CT completed today and confirmed pancreatitis

## 2021-02-12 NOTE — Assessment & Plan Note (Signed)
Hold BP meds for tonight allow permissive hypertension and monitor

## 2021-02-12 NOTE — Assessment & Plan Note (Signed)
-   Check TSH continue home medications at current dose ° °

## 2021-02-12 NOTE — Subjective & Objective (Signed)
Patient reporting abdominal pain for the past 1 week he has known history of pancreatic cancer metastatic to lung and liver is had a CT scan done today that showed pancreatitis. He has been having significant abdominal pain for past 3 to 4 days 10 out of 10 worse with eating he has been trying to use Dilaudid 2 mg every 4 hours but it does not seem to help.  He presented to cancer center today and received Dilaudid IV while in clinic. Also reports some nausea and fatigue.

## 2021-02-13 LAB — MAGNESIUM
Magnesium: 1.1 mg/dL — ABNORMAL LOW (ref 1.7–2.4)
Magnesium: 1.9 mg/dL (ref 1.7–2.4)

## 2021-02-13 LAB — CBC WITH DIFFERENTIAL/PLATELET
Abs Immature Granulocytes: 0 10*3/uL (ref 0.00–0.07)
Basophils Absolute: 0 10*3/uL (ref 0.0–0.1)
Basophils Relative: 1 %
Eosinophils Absolute: 0 10*3/uL (ref 0.0–0.5)
Eosinophils Relative: 1 %
HCT: 31.2 % — ABNORMAL LOW (ref 39.0–52.0)
Hemoglobin: 10.4 g/dL — ABNORMAL LOW (ref 13.0–17.0)
Immature Granulocytes: 0 %
Lymphocytes Relative: 29 %
Lymphs Abs: 0.9 10*3/uL (ref 0.7–4.0)
MCH: 31.3 pg (ref 26.0–34.0)
MCHC: 33.3 g/dL (ref 30.0–36.0)
MCV: 94 fL (ref 80.0–100.0)
Monocytes Absolute: 0.4 10*3/uL (ref 0.1–1.0)
Monocytes Relative: 14 %
Neutro Abs: 1.6 10*3/uL — ABNORMAL LOW (ref 1.7–7.7)
Neutrophils Relative %: 55 %
Platelets: 88 10*3/uL — ABNORMAL LOW (ref 150–400)
RBC: 3.32 MIL/uL — ABNORMAL LOW (ref 4.22–5.81)
RDW: 12.7 % (ref 11.5–15.5)
WBC: 3 10*3/uL — ABNORMAL LOW (ref 4.0–10.5)
nRBC: 0 % (ref 0.0–0.2)

## 2021-02-13 LAB — LIPID PANEL
Cholesterol: 148 mg/dL (ref 0–200)
HDL: 32 mg/dL — ABNORMAL LOW (ref >40)
LDL Cholesterol: 102 mg/dL — ABNORMAL HIGH (ref 0–99)
Total CHOL/HDL Ratio: 4.6 RATIO
Triglycerides: 72 mg/dL (ref <150)
VLDL: 14 mg/dL (ref 0–40)

## 2021-02-13 LAB — COMPREHENSIVE METABOLIC PANEL
ALT: 6 U/L (ref 0–44)
ALT: 11 U/L (ref 0–44)
AST: 7 U/L — ABNORMAL LOW (ref 15–41)
AST: 12 U/L — ABNORMAL LOW (ref 15–41)
Albumin: 1.9 g/dL — ABNORMAL LOW (ref 3.5–5.0)
Albumin: 3.8 g/dL (ref 3.5–5.0)
Alkaline Phosphatase: 37 U/L — ABNORMAL LOW (ref 38–126)
Alkaline Phosphatase: 76 U/L (ref 38–126)
Anion gap: 3 — ABNORMAL LOW (ref 5–15)
Anion gap: 9 (ref 5–15)
BUN: 8 mg/dL (ref 8–23)
BUN: 14 mg/dL (ref 8–23)
CO2: 13 mmol/L — ABNORMAL LOW (ref 22–32)
CO2: 21 mmol/L — ABNORMAL LOW (ref 22–32)
Calcium: 4.5 mg/dL — CL (ref 8.9–10.3)
Calcium: 8.7 mg/dL — ABNORMAL LOW (ref 8.9–10.3)
Chloride: 125 mmol/L — ABNORMAL HIGH (ref 98–111)
Chloride: 105 mmol/L (ref 98–111)
Creatinine, Ser: 0.45 mg/dL — ABNORMAL LOW (ref 0.61–1.24)
Creatinine, Ser: 0.89 mg/dL (ref 0.61–1.24)
GFR, Estimated: >60 mL/min (ref >60)
GFR, Estimated: >60 mL/min (ref >60)
Glucose, Bld: 82 mg/dL (ref 70–99)
Glucose, Bld: 149 mg/dL — ABNORMAL HIGH (ref 70–99)
Potassium: 2.1 mmol/L — CL (ref 3.5–5.1)
Potassium: 4.1 mmol/L (ref 3.5–5.1)
Sodium: 141 mmol/L (ref 135–145)
Sodium: 135 mmol/L (ref 135–145)
Total Bilirubin: 0.4 mg/dL (ref 0.3–1.2)
Total Bilirubin: 0.9 mg/dL (ref 0.3–1.2)
Total Protein: 3.2 g/dL — ABNORMAL LOW (ref 6.5–8.1)
Total Protein: 6.8 g/dL (ref 6.5–8.1)

## 2021-02-13 LAB — HEMOGLOBIN A1C
Hgb A1c MFr Bld: 7.4 % — ABNORMAL HIGH (ref 4.8–5.6)
Mean Plasma Glucose: 165.68 mg/dL

## 2021-02-13 LAB — GLUCOSE, CAPILLARY
Glucose-Capillary: 113 mg/dL — ABNORMAL HIGH (ref 70–99)
Glucose-Capillary: 129 mg/dL — ABNORMAL HIGH (ref 70–99)
Glucose-Capillary: 131 mg/dL — ABNORMAL HIGH (ref 70–99)
Glucose-Capillary: 158 mg/dL — ABNORMAL HIGH (ref 70–99)

## 2021-02-13 LAB — PHOSPHORUS
Phosphorus: 1.9 mg/dL — ABNORMAL LOW (ref 2.5–4.6)
Phosphorus: 3.7 mg/dL (ref 2.5–4.6)

## 2021-02-13 LAB — TSH: TSH: 0.664 u[IU]/mL (ref 0.350–4.500)

## 2021-02-13 MED ORDER — SODIUM CHLORIDE 0.9 % IV SOLN: INTRAVENOUS | Status: DC

## 2021-02-13 MED ORDER — ACETAMINOPHEN 325 MG PO TABS
650.0000 mg | ORAL_TABLET | Freq: Four times a day (QID) | ORAL | Status: DC | PRN
  Administered 2021-02-19 – 2021-02-21 (×2): 650 mg via ORAL
  Filled 2021-02-13 (×2): qty 2

## 2021-02-13 MED ORDER — HYDROMORPHONE HCL 1 MG/ML IJ SOLN
1.0000 mg | INTRAMUSCULAR | Status: DC | PRN
Start: 2021-02-13 — End: 2021-02-19
  Administered 2021-02-13 – 2021-02-17 (×26): 1 mg via INTRAVENOUS
  Administered 2021-02-17: 2 mg via INTRAVENOUS
  Administered 2021-02-17: 1 mg via INTRAVENOUS
  Administered 2021-02-17 – 2021-02-18 (×6): 2 mg via INTRAVENOUS
  Administered 2021-02-18: 1 mg via INTRAVENOUS
  Administered 2021-02-18 (×5): 2 mg via INTRAVENOUS
  Administered 2021-02-19: 1 mg via INTRAVENOUS
  Administered 2021-02-19 (×2): 2 mg via INTRAVENOUS
  Filled 2021-02-13 (×2): qty 2
  Filled 2021-02-13: qty 1
  Filled 2021-02-13: qty 2
  Filled 2021-02-13 (×2): qty 1
  Filled 2021-02-13 (×2): qty 2
  Filled 2021-02-13 (×2): qty 1
  Filled 2021-02-13 (×3): qty 2
  Filled 2021-02-13 (×10): qty 1
  Filled 2021-02-13: qty 2
  Filled 2021-02-13 (×2): qty 1
  Filled 2021-02-13: qty 2
  Filled 2021-02-13 (×6): qty 1
  Filled 2021-02-13 (×4): qty 2
  Filled 2021-02-13 (×6): qty 1

## 2021-02-13 MED ORDER — PANCRELIPASE (LIP-PROT-AMYL) 12000-38000 UNITS PO CPEP
36000.0000 [IU] | ORAL_CAPSULE | Freq: Three times a day (TID) | ORAL | Status: DC
  Administered 2021-02-13 – 2021-02-25 (×22): 36000 [IU] via ORAL
  Filled 2021-02-13 (×6): qty 3
  Filled 2021-02-13 (×2): qty 1
  Filled 2021-02-13 (×21): qty 3

## 2021-02-13 MED ORDER — HYDROCODONE-ACETAMINOPHEN 5-325 MG PO TABS
1.0000 | ORAL_TABLET | ORAL | Status: DC | PRN
  Administered 2021-02-15 – 2021-02-19 (×3): 2 via ORAL
  Filled 2021-02-13 (×5): qty 2

## 2021-02-13 MED ORDER — LEVOTHYROXINE SODIUM 25 MCG PO TABS
137.0000 ug | ORAL_TABLET | Freq: Every day | ORAL | Status: DC
  Administered 2021-02-13 – 2021-02-25 (×13): 137 ug via ORAL
  Filled 2021-02-13 (×13): qty 1

## 2021-02-13 MED ORDER — SODIUM CHLORIDE 0.9% FLUSH: 10.0000 mL | INTRAVENOUS | Status: DC | PRN

## 2021-02-13 MED ORDER — CHLORHEXIDINE GLUCONATE CLOTH 2 % EX PADS
6.0000 | MEDICATED_PAD | Freq: Every day | CUTANEOUS | Status: DC
  Administered 2021-02-13 – 2021-02-25 (×12): 6 via TOPICAL

## 2021-02-13 MED ORDER — ACETAMINOPHEN 650 MG RE SUPP: 650.0000 mg | Freq: Four times a day (QID) | RECTAL | Status: DC | PRN

## 2021-02-13 NOTE — Progress Notes (Signed)
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 CT abdomen and pelvis findings consistent with acute interstitial edematous pancreatitis. Assessment & Plan:   Principal Problem:   Pancreatitis Active Problems:   Essential hypertension   Mixed hyperlipidemia   Controlled type 2 diabetes mellitus without complication, without long-term current use of insulin (HCC)   Nausea without vomiting   Thrombocytopenia (HCC)   Postoperative hypothyroidism   Anemia associated with chemotherapy   #1 acute pancreatitis likely multifactorial with normal lipase CT findings consistent with acute interstitial edematous pancreatitis.  Patient admitted with severe abdominal pain nausea and vomiting.  History of stage IV pancreatic cancer with mets to the liver and lungs.  He was undergoing chemo prior to admission. Lipid panel triglyceride levels are 72 LDL is 102 ACE inhibitor's were stopped on admission thinking that could be the culprit for pancreatitis Continue n.p.o. IV fluids and pain control with Dilaudid.  #2 type 2 diabetes on sliding scale insulin patient is n.p.o.  #3 history of essential hypertension blood pressure 157/88.  We will add hydralazine as needed.  #4 hypothyroidism continue Synthroid  #5 hyperlipidemia LDL elevated 102 he is being on statins in the past which he stopped himself due to severe myalgias.  #6 anemia chronic stable monitor  #7 thrombocytopenia platelets trending down to 88 from 172.  Will closely monitor.  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   Estimated body mass index is 26.54 kg/m as calculated from the following:   Height as of this encounter: 5\' 9"  (1.753 m).   Weight as of this encounter: 81.5 kg.  DVT prophylaxis:eliquis Code Status: dnr Family Communication: dw wife  Disposition Plan:  Status is: Inpatient  Remains inpatient appropriate because: ivf    Consultants: none  Procedures:none Antimicrobials none  Subjective: Patient resting in bed appears to be in some distress due to abdominal pain.  Wife by the bedside.  Objective: Vitals:   02/13/21 0900 02/13/21 0945 02/13/21 1110 02/13/21 1209  BP: 134/90 (!) 157/92 (!) 148/89 (!) 157/88  Pulse: 75 78 69 79  Resp: 16 16 16 18   Temp:    98.2 F (36.8 C)  TempSrc:    Oral  SpO2: 98% 97% 96% 98%  Weight:      Height:       No intake or output data in the 24 hours ending 02/13/21 1359 Filed Weights   02/12/21 1633  Weight: 81.5 kg    Examination:  General exam: Appears calm and comfortable  Respiratory system: Clear to auscultation. Respiratory effort normal. Cardiovascular system: S1 & S2 heard, RRR. No JVD, murmurs, rubs, gallops or clicks. No pedal edema. Gastrointestinal system: Abdomen is nondistended, soft and tender. No organomegaly or masses felt. Normal bowel sounds heard. Central nervous system: Alert and oriented. No focal neurological deficits. Extremities: Symmetric 5 x 5 power. Skin: No rashes, lesions or ulcers Psychiatry: Judgement and insight appear normal. Mood & affect appropriate.     Data Reviewed: I have personally reviewed following labs and imaging studies  CBC: Recent Labs  Lab 02/12/21 1733 02/13/21 0500  WBC 2.7* 3.0*  NEUTROABS 1.4* 1.6*  HGB 10.5* 10.4*  HCT 31.1* 31.2*  MCV 92.8 94.0  PLT 93* 88*   Basic Metabolic Panel: Recent Labs  Lab 02/12/21 1733 02/12/21 2000 02/13/21 0500 02/13/21 0844  NA 135  --  141 135  K 3.8  --  2.1* 4.1  CL 109  --  125* 105  CO2 21*  --  13* 21*   GLUCOSE 124*  --  82 149*  BUN 13  --  8 14  CREATININE 0.87  --  0.45* 0.89  CALCIUM 8.4*  --  4.5* 8.7*  MG  --  2.0 1.1*  --   PHOS  --  4.1 1.9*  --    GFR: Estimated Creatinine Clearance: 83.9 mL/min (by C-G formula based on SCr of 0.89 mg/dL). Liver Function Tests: Recent Labs  Lab 02/12/21 1733 02/13/21 0500 02/13/21 0844  AST 14* 7* 12*  ALT 12 6 11   ALKPHOS 75 37* 76  BILITOT 0.6 0.4 0.9  PROT 6.8 3.2* 6.8  ALBUMIN 3.8 1.9* 3.8   Recent Labs  Lab 02/12/21 1733  LIPASE 21   No results for input(s): AMMONIA in the last 168 hours. Coagulation Profile: No results for input(s): INR, PROTIME in the last 168 hours. Cardiac Enzymes: Recent Labs  Lab 02/12/21 2000  CKTOTAL 53   BNP (last 3 results) No results for input(s): PROBNP in the last 8760 hours. HbA1C: Recent Labs    02/12/21 2000  HGBA1C 7.4*   CBG: Recent Labs  Lab 02/12/21 2004 02/13/21 1207  GLUCAP 89 129*   Lipid Profile: Recent Labs    02/13/21 0330  CHOL 148  HDL 32*  LDLCALC 102*  TRIG 72  CHOLHDL 4.6   Thyroid Function Tests: Recent Labs    02/13/21 0500  TSH 0.664   Anemia Panel: Recent Labs    02/12/21 2000  VITAMINB12 364  FOLATE 18.2  FERRITIN 212  TIBC 224*  IRON 34*  RETICCTPCT 1.9   Sepsis Labs: Recent Labs  Lab 02/12/21 2000  LATICACIDVEN 0.7    Recent Results (from the past 240 hour(s))  Resp Panel by RT-PCR (Flu A&B, Covid) Nasopharyngeal Swab     Status: None   Collection Time: 02/12/21  5:49 PM   Specimen: Nasopharyngeal Swab; Nasopharyngeal(NP) swabs in vial transport medium  Result Value Ref Range Status   SARS Coronavirus 2 by RT PCR NEGATIVE NEGATIVE Final    Comment: (NOTE) SARS-CoV-2 target nucleic acids are NOT DETECTED.  The SARS-CoV-2 RNA is generally detectable in upper respiratory specimens during the acute phase of infection. The lowest concentration of SARS-CoV-2 viral copies this assay can detect is 138 copies/mL. A negative  result does not preclude SARS-Cov-2 infection and should not be used as the sole basis for treatment or other patient management decisions. A negative result may occur with  improper specimen collection/handling, submission of specimen other than nasopharyngeal swab, presence of viral mutation(s) within the areas targeted by this assay, and inadequate number of viral copies(<138 copies/mL). A negative result must be combined with clinical observations, patient history, and epidemiological information. The expected result is Negative.  Fact Sheet for Patients:  EntrepreneurPulse.com.au  Fact Sheet for Healthcare Providers:  IncredibleEmployment.be  This test is no t yet approved or cleared by the [place] FDA and  has been authorized for detection and/or diagnosis of SARS-CoV-2 by FDA under an Emergency Use Authorization (EUA). This EUA will remain  in effect (meaning this test can be used) for the duration of the  COVID-19 declaration under Section 564(b)(1) of the Act, 21 U.S.C.section 360bbb-3(b)(1), unless the authorization is terminated  or revoked sooner.       Influenza A by PCR NEGATIVE NEGATIVE Final   Influenza B by PCR NEGATIVE NEGATIVE Final    Comment: (NOTE) The Xpert Xpress SARS-CoV-2/FLU/RSV plus assay is intended as an aid in the diagnosis of influenza from Nasopharyngeal swab specimens and should not be used as a sole basis for treatment. Nasal washings and aspirates are unacceptable for Xpert Xpress SARS-CoV-2/FLU/RSV testing.  Fact Sheet for Patients: EntrepreneurPulse.com.au  Fact Sheet for Healthcare Providers: IncredibleEmployment.be  This test is not yet approved or cleared by the [place] FDA and has been authorized for detection and/or diagnosis of SARS-CoV-2 by FDA under an Emergency Use Authorization (EUA). This EUA will remain in effect (meaning this test can be used)  for the duration of the COVID-19 declaration under Section 564(b)(1) of the Act, 21 U.S.C. section 360bbb-3(b)(1), unless the authorization is terminated or revoked.  Performed at [facility], [place] [address], [place], [place] [address]          Radiology Studies: CT ABDOMEN PELVIS W CONTRAST  Result Date: 02/12/2021 CLINICAL DATA:  Pancreatic cancer with acute abdominal pain. Mid abdominal pain since diagnosis now worsening since 02/08/2021. Also reportedly with nausea constipation. EXAM: CT ABDOMEN AND PELVIS WITH CONTRAST TECHNIQUE: Multidetector CT imaging of the abdomen and pelvis was performed using the standard protocol following bolus administration of intravenous contrast. CONTRAST:  24mL OMNIPAQUE IOHEXOL 350 MG/ML SOLN COMPARISON:  November 02, 2020 also compared with January 18, 2021. FINDINGS: Lower chest: Port-A-Cath terminates in the distal superior vena cava. Lung bases are clear. No effusion. No consolidative changes. Hepatobiliary: Subtle peripheral foci of arterial phase enhancement without washout particularly about the gallbladder fossa no substantial change compared to previous imaging. Hypodense area along the gallbladder fossa (image 52/7) 15 mm stable since the December 5th evaluation. Hypodensity anterior to the porta hepatis stable since December 5th as well. 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 No pericholecystic stranding or intrahepatic biliary duct distension. Pancreas: Pancreatic head mass obstructing the main pancreatic duct is difficult to separate from numerous areas of low attenuation about the pancreatic parenchyma that have developed surrounding ductal dilation that was seen on more remote studies. 2.5 cm area is suspected in the head of the pancreas obstructing the duct. Nodularity in the transverse mesocolon. Areas of low attenuation in the  pancreatic tail have increased in size, for instance (image 40/7) 18 mm as compared to 14 mm. Other areas of low attenuation are minimally increased. Spleen: Normal. Adrenals/Urinary Tract: Insert normal renal contrast Stomach/Bowel: Abundant collateral venous structures about the stomach related to occluded splenic portal confluence. Engorgement of the gastro colic trunk and gastroepiploic veins. This is similar to prior studies. 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 Inflammation contacts the posterior wall of the gastric antrum. No sign of bowel obstruction. The appendix is normal. Vascular/Lymphatic: Aortic atherosclerosis. No sign of aneurysm. Smooth contour of the IVC. There is no gastrohepatic or hepatoduodenal ligament lymphadenopathy. No retroperitoneal or mesenteric lymphadenopathy. No pelvic sidewall lymphadenopathy. Reproductive: Unremarkable. Other: No free intraperitoneal air. No overt peritoneal nodularity. Trace ascites. This is present only in the pelvis Musculoskeletal: No acute bone finding. No destructive bone process. Spinal degenerative changes. IMPRESSION: Acute pancreatitis with slight worsening superimposed on locally advanced pancreatic  neoplasm which obstructs the portal venous system at the splenic portal confluence. Irregular dilated main pancreatic duct gives way to ovoid areas of low attenuation surrounded by inflammation in the midst of remaining parenchyma in the pancreatic tail. Findings are compatible with acute interstitial edematous pancreatitis likely with dilated side branches. It would be difficult to exclude concomitant worsening of pancreatic neoplasm in the current setting. What can be seen in the pancreatic head is very similar to previous imaging. Decreasing size of low-attenuation areas in the liver, areas along the gallbladder fossa and anterior to porta hepatis are compatible with focal fat deposition the other area seen inferior to these areas  along hepatic subsegment VI based on appearance and evolution is also more likely to represent focal fat. Consider attention on follow-up or a differentiation would change management PET or MRI could be considered as discussed previously. These results will be called to the ordering clinician or representative by the Radiologist Assistant, and communication documented in the PACS or [employer]. Electronically Signed   By: Zetta Bills M.D.   On: 02/12/2021 15:19        Scheduled Meds:  apixaban  5 mg Oral BID   insulin aspart  0-9 Units Subcutaneous Q4H   levothyroxine  137 mcg Oral Q0600   lipase/protease/amylase  36,000 Units Oral TID AC   Continuous Infusions:  sodium chloride 100 mL/hr at 02/13/21 0500     LOS: 1 day    Time spent:45 min  Georgette Shell, MD 02/13/2021, 1:59 PM

## 2021-02-13 NOTE — ED Notes (Signed)
CBG 144 

## 2021-02-14 ENCOUNTER — Other Ambulatory Visit: Payer: Self-pay | Admitting: Hematology

## 2021-02-14 ENCOUNTER — Encounter (HOSPITAL_COMMUNITY): Payer: Self-pay | Admitting: Internal Medicine

## 2021-02-14 ENCOUNTER — Other Ambulatory Visit: Payer: Self-pay | Admitting: Endocrinology

## 2021-02-14 DIAGNOSIS — C259 Malignant neoplasm of pancreas, unspecified: Secondary | ICD-10-CM

## 2021-02-14 LAB — COMPREHENSIVE METABOLIC PANEL
ALT: 10 U/L (ref 0–44)
AST: 12 U/L — ABNORMAL LOW (ref 15–41)
Albumin: 3.5 g/dL (ref 3.5–5.0)
Alkaline Phosphatase: 73 U/L (ref 38–126)
Anion gap: 11 (ref 5–15)
BUN: 12 mg/dL (ref 8–23)
CO2: 20 mmol/L — ABNORMAL LOW (ref 22–32)
Calcium: 8.7 mg/dL — ABNORMAL LOW (ref 8.9–10.3)
Chloride: 106 mmol/L (ref 98–111)
Creatinine, Ser: 0.73 mg/dL (ref 0.61–1.24)
GFR, Estimated: >60 mL/min (ref >60)
Glucose, Bld: 116 mg/dL — ABNORMAL HIGH (ref 70–99)
Potassium: 3.8 mmol/L (ref 3.5–5.1)
Sodium: 137 mmol/L (ref 135–145)
Total Bilirubin: 0.8 mg/dL (ref 0.3–1.2)
Total Protein: 6.5 g/dL (ref 6.5–8.1)

## 2021-02-14 LAB — CBC
HCT: 29.2 % — ABNORMAL LOW (ref 39.0–52.0)
Hemoglobin: 9.7 g/dL — ABNORMAL LOW (ref 13.0–17.0)
MCH: 31.3 pg (ref 26.0–34.0)
MCHC: 33.2 g/dL (ref 30.0–36.0)
MCV: 94.2 fL (ref 80.0–100.0)
Platelets: 73 10*3/uL — ABNORMAL LOW (ref 150–400)
RBC: 3.1 MIL/uL — ABNORMAL LOW (ref 4.22–5.81)
RDW: 12.7 % (ref 11.5–15.5)
WBC: 3 10*3/uL — ABNORMAL LOW (ref 4.0–10.5)
nRBC: 0 % (ref 0.0–0.2)

## 2021-02-14 LAB — GLUCOSE, CAPILLARY
Glucose-Capillary: 110 mg/dL — ABNORMAL HIGH (ref 70–99)
Glucose-Capillary: 122 mg/dL — ABNORMAL HIGH (ref 70–99)
Glucose-Capillary: 156 mg/dL — ABNORMAL HIGH (ref 70–99)
Glucose-Capillary: 134 mg/dL — ABNORMAL HIGH (ref 70–99)
Glucose-Capillary: 129 mg/dL — ABNORMAL HIGH (ref 70–99)

## 2021-02-14 LAB — LIPASE, BLOOD: Lipase: 23 U/L (ref 11–51)

## 2021-02-14 MED ORDER — AMLODIPINE BESYLATE 5 MG PO TABS
5.0000 mg | ORAL_TABLET | Freq: Every day | ORAL | Status: DC
  Administered 2021-02-14 – 2021-02-15 (×2): 5 mg via ORAL
  Filled 2021-02-14 (×2): qty 1

## 2021-02-14 MED ORDER — PROCHLORPERAZINE MALEATE 10 MG PO TABS
10.0000 mg | ORAL_TABLET | Freq: Two times a day (BID) | ORAL | Status: DC
  Administered 2021-02-14 – 2021-02-25 (×23): 10 mg via ORAL
  Filled 2021-02-14 (×23): qty 1

## 2021-02-14 NOTE — Progress Notes (Signed)
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 CT abdomen and pelvis findings consistent with acute interstitial edematous pancreatitis. Assessment & Plan:   Principal Problem:   Pancreatitis Active Problems:   Essential hypertension   Mixed hyperlipidemia   Controlled type 2 diabetes mellitus without complication, without long-term current use of insulin (HCC)   Nausea without vomiting   Thrombocytopenia (HCC)   Postoperative hypothyroidism   Anemia associated with chemotherapy   #1 acute pancreatitis likely multifactorial with normal lipase CT findings consistent with acute interstitial edematous pancreatitis.  Patient admitted with severe abdominal pain nausea and vomiting.  History of stage IV pancreatic cancer with mets to the liver and lungs.  He was undergoing chemo prior to admission. Lipid panel triglyceride levels are 72 LDL is 102 ACE inhibitor's were stopped on admission thinking that could be the culprit for pancreatitis Continue IV fluids and pain control with Dilaudid. Will try clear liquids slowly today  #2 type 2 diabetes on sliding scale insulin  CBG (last 3)  Recent Labs    02/14/21 0524 02/14/21 0751 02/14/21 1135  GLUCAP 110* 122* 156*     #3 history of essential hypertension blood pressure 157/88.  We will add hydralazine as needed.  #4 hypothyroidism continue Synthroid  #5 hyperlipidemia LDL elevated 102 he is being on statins in the past which he stopped himself due to severe myalgias.  #6 anemia chronic stable monitor  #7 thrombocytopenia platelets trending  down to 73 from 88 from 172.  Will closely monitor.  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   Estimated body mass index is 26.54 kg/m as calculated from the following:   Height as of this encounter: 5\' 9"  (1.753 m).   Weight as of this encounter: 81.5 kg.  DVT prophylaxis:eliquis Code Status: dnr Family Communication: dw wife  Disposition Plan:  Status is: Inpatient  Remains inpatient appropriate because: ivf    Consultants: none  Procedures:none Antimicrobials none  Subjective:  He still has a lot of pain however he is asking for something to drink and see how he does Lipase is normal no nausea vomiting Objective: Vitals:   02/13/21 1209 02/13/21 1949 02/14/21 0522 02/14/21 1354  BP: (!) 157/88 (!) 162/100 (!) 154/90 (!) 165/92  Pulse: 79 88 77 82  Resp: 18 20 20 18   Temp: 98.2 F (36.8 C) 98.1 F (36.7 C) 98.7 F (37.1 C) 98.5 F (36.9 C)  TempSrc: Oral Oral Oral Oral  SpO2: 98% 98% 98% 98%  Weight:      Height:        Intake/Output Summary (Last 24 hours) at 02/14/2021 1414 Last data filed at 02/14/2021 0300 Gross per 24 hour  Intake 1748.95 ml  Output --  Net 1748.95 ml   Filed Weights   02/12/21 1633  Weight: 81.5 kg    Examination:  General exam: Appears calm and comfortable  Respiratory system: Clear to auscultation. Respiratory effort normal. Cardiovascular system: S1 & S2 heard, RRR. No JVD, murmurs, rubs, gallops or clicks. No pedal edema. Gastrointestinal system: Abdomen is nondistended, soft and tender. No organomegaly or masses felt. Normal bowel sounds heard.  Central nervous system: Alert and oriented. No focal neurological deficits. Extremities: Symmetric 5 x 5 power. Skin: No rashes, lesions or ulcers Psychiatry: Judgement and insight appear normal. Mood & affect appropriate.     Data Reviewed: I have personally reviewed following labs and imaging studies  CBC: Recent Labs  Lab 02/12/21 1733  02/13/21 0500 02/14/21 0502  WBC 2.7* 3.0* 3.0*  NEUTROABS 1.4* 1.6*  --   HGB 10.5* 10.4* 9.7*  HCT 31.1* 31.2* 29.2*  MCV 92.8 94.0 94.2  PLT 93* 88* 73*    Basic Metabolic Panel: Recent Labs  Lab 02/12/21 1733 02/12/21 2000 02/13/21 0500 02/13/21 0844 02/13/21 1440 02/14/21 0502  NA 135  --  141 135  --  137  K 3.8  --  2.1* 4.1  --  3.8  CL 109  --  125* 105  --  106  CO2 21*  --  13* 21*  --  20*  GLUCOSE 124*  --  82 149*  --  116*  BUN 13  --  8 14  --  12  CREATININE 0.87  --  0.45* 0.89  --  0.73  CALCIUM 8.4*  --  4.5* 8.7*  --  8.7*  MG  --  2.0 1.1*  --  1.9  --   PHOS  --  4.1 1.9*  --  3.7  --     GFR: Estimated Creatinine Clearance: 93.3 mL/min (by C-G formula based on SCr of 0.73 mg/dL). Liver Function Tests: Recent Labs  Lab 02/12/21 1733 02/13/21 0500 02/13/21 0844 02/14/21 0502  AST 14* 7* 12* 12*  ALT 12 6 11 10   ALKPHOS 75 37* 76 73  BILITOT 0.6 0.4 0.9 0.8  PROT 6.8 3.2* 6.8 6.5  ALBUMIN 3.8 1.9* 3.8 3.5    Recent Labs  Lab 02/12/21 1733 02/14/21 0502  LIPASE 21 23    No results for input(s): AMMONIA in the last 168 hours. Coagulation Profile: No results for input(s): INR, PROTIME in the last 168 hours. Cardiac Enzymes: Recent Labs  Lab 02/12/21 2000  CKTOTAL 53    BNP (last 3 results) No results for input(s): PROBNP in the last 8760 hours. HbA1C: Recent Labs    02/12/21 2000  HGBA1C 7.4*    CBG: Recent Labs  Lab 02/13/21 1950 02/13/21 2351 02/14/21 0524 02/14/21 0751 02/14/21 1135  GLUCAP 158* 113* 110* 122* 156*    Lipid Profile: Recent Labs    02/13/21 0330  CHOL 148  HDL 32*  LDLCALC 102*  TRIG 72  CHOLHDL 4.6    Thyroid Function Tests: Recent Labs    02/13/21 0500  TSH 0.664    Anemia Panel: Recent Labs    02/12/21 2000  VITAMINB12 364  FOLATE 18.2  FERRITIN 212  TIBC 224*  IRON 34*  RETICCTPCT 1.9    Sepsis Labs: Recent Labs  Lab 02/12/21 2000  LATICACIDVEN 0.7      Recent Results (from the past 240 hour(s))  Resp Panel by RT-PCR (Flu A&B, Covid) Nasopharyngeal Swab     Status: None   Collection Time: 02/12/21  5:49 PM   Specimen: Nasopharyngeal Swab; Nasopharyngeal(NP) swabs in vial transport medium  Result Value Ref Range Status   SARS Coronavirus 2 by RT PCR NEGATIVE NEGATIVE Final    Comment: (NOTE) SARS-CoV-2 target nucleic acids are NOT DETECTED.  The SARS-CoV-2 RNA is generally detectable in upper respiratory specimens during the acute phase of infection. The lowest concentration of SARS-CoV-2 viral copies this  assay can detect is 138 copies/mL. A negative result does not preclude SARS-Cov-2 infection and should not be used as the sole basis for treatment or other patient management decisions. A negative result may occur with  improper specimen collection/handling, submission of specimen other than nasopharyngeal swab, presence of viral mutation(s) within the areas targeted by this assay, and inadequate number of viral copies(<138 copies/mL). A negative result must be combined with clinical observations, patient history, and epidemiological information. The expected result is Negative.  Fact Sheet for Patients:  EntrepreneurPulse.com.au  Fact Sheet for Healthcare Providers:  IncredibleEmployment.be  This test is no t yet approved or cleared by the [place] FDA and  has been authorized for detection and/or diagnosis of SARS-CoV-2 by FDA under an Emergency Use Authorization (EUA). This EUA will remain  in effect (meaning this test can be used) for the duration of the COVID-19 declaration under Section 564(b)(1) of the Act, 21 U.S.C.section 360bbb-3(b)(1), unless the authorization is terminated  or revoked sooner.       Influenza A by PCR NEGATIVE NEGATIVE Final   Influenza B by PCR NEGATIVE NEGATIVE Final    Comment: (NOTE) The Xpert Xpress SARS-CoV-2/FLU/RSV plus assay is intended as  an aid in the diagnosis of influenza from Nasopharyngeal swab specimens and should not be used as a sole basis for treatment. Nasal washings and aspirates are unacceptable for Xpert Xpress SARS-CoV-2/FLU/RSV testing.  Fact Sheet for Patients: EntrepreneurPulse.com.au  Fact Sheet for Healthcare Providers: IncredibleEmployment.be  This test is not yet approved or cleared by the [place] FDA and has been authorized for detection and/or diagnosis of SARS-CoV-2 by FDA under an Emergency Use Authorization (EUA). This EUA will remain in effect (meaning this test can be used) for the duration of the COVID-19 declaration under Section 564(b)(1) of the Act, 21 U.S.C. section 360bbb-3(b)(1), unless the authorization is terminated or revoked.  Performed at [facility], [place] [address], [place], [place] [address]           Radiology Studies: CT ABDOMEN PELVIS W CONTRAST  Result Date: 02/12/2021 CLINICAL DATA:  Pancreatic cancer with acute abdominal pain. Mid abdominal pain since diagnosis now worsening since 02/08/2021. Also reportedly with nausea constipation. EXAM: CT ABDOMEN AND PELVIS WITH CONTRAST TECHNIQUE: Multidetector CT imaging of the abdomen and pelvis was performed using the standard protocol following bolus administration of intravenous contrast. CONTRAST:  10mL OMNIPAQUE IOHEXOL 350 MG/ML SOLN COMPARISON:  November 02, 2020 also compared with January 18, 2021. FINDINGS: Lower chest: Port-A-Cath terminates in the distal superior vena cava. Lung bases are clear. No effusion. No consolidative changes. Hepatobiliary: Subtle peripheral foci of arterial phase enhancement without washout particularly about the gallbladder fossa no substantial change compared to previous imaging. Hypodense area along the gallbladder fossa (image 52/7) 15 mm stable since the December 5th evaluation. Hypodensity anterior to the porta hepatis stable  since December 5th as well. 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 No pericholecystic stranding or intrahepatic biliary duct distension. Pancreas: Pancreatic head mass obstructing the main pancreatic duct is difficult to separate from numerous areas of low attenuation about the pancreatic parenchyma that have developed surrounding ductal dilation that was seen on more remote studies. 2.5 cm area is suspected in the head of the pancreas obstructing the duct. Nodularity in the transverse mesocolon. Areas of low attenuation in the pancreatic tail have increased in size, for instance (  image 40/7) 18 mm as compared to 14 mm. Other areas of low attenuation are minimally increased. Spleen: Normal. Adrenals/Urinary Tract: Insert normal renal contrast Stomach/Bowel: Abundant collateral venous structures about the stomach related to occluded splenic portal confluence. Engorgement of the gastro colic trunk and gastroepiploic veins. This is similar to prior studies. 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 Inflammation contacts the posterior wall of the gastric antrum. No sign of bowel obstruction. The appendix is normal. Vascular/Lymphatic: Aortic atherosclerosis. No sign of aneurysm. Smooth contour of the IVC. There is no gastrohepatic or hepatoduodenal ligament lymphadenopathy. No retroperitoneal or mesenteric lymphadenopathy. No pelvic sidewall lymphadenopathy. Reproductive: Unremarkable. Other: No free intraperitoneal air. No overt peritoneal nodularity. Trace ascites. This is present only in the pelvis Musculoskeletal: No acute bone finding. No destructive bone process. Spinal degenerative changes. 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 Irregular dilated main  pancreatic duct gives way to ovoid areas of low attenuation surrounded by inflammation in the midst of remaining parenchyma in the pancreatic tail. Findings are compatible with acute interstitial edematous pancreatitis likely with dilated side branches. It would be difficult to exclude concomitant worsening of pancreatic neoplasm in the current setting. What can be seen in the pancreatic head is very similar to previous imaging. Decreasing size of low-attenuation areas in the liver, areas along the gallbladder fossa and anterior to porta hepatis are compatible with focal fat deposition the other area seen inferior to these areas along hepatic subsegment VI based on appearance and evolution is also more likely to represent focal fat. Consider attention on follow-up or a differentiation would change management PET or MRI could be considered as discussed previously. These results will be called to the ordering clinician or representative by the Radiologist Assistant, and communication documented in the PACS or [employer]. Electronically Signed   By: Zetta Bills M.D.   On: 02/12/2021 15:19        Scheduled Meds:  apixaban  5 mg Oral BID   Chlorhexidine Gluconate Cloth  6 each Topical Daily   insulin aspart  0-9 Units Subcutaneous Q4H   levothyroxine  137 mcg Oral Q0600   lipase/protease/amylase  36,000 Units Oral TID AC   prochlorperazine  10 mg Oral BID   Continuous Infusions:  sodium chloride 100 mL/hr at 02/14/21 0219     LOS: 2 days    Time spent:45 min  Georgette Shell, MD 02/14/2021, 2:14 PM

## 2021-02-14 NOTE — Plan of Care (Addendum)
Patient requiring IV pain medication q 3 hours around the clock.   Problem: Education: Goal: Knowledge of General Education information will improve Description: Including pain rating scale, medication(s)/side effects and non-pharmacologic comfort measures Outcome: Progressing   Problem: Health Behavior/Discharge Planning: Goal: Ability to manage health-related needs will improve Outcome: Progressing   Problem: Clinical Measurements: Goal: Ability to maintain clinical measurements within normal limits will improve Outcome: Progressing Goal: Will remain free from infection Outcome: Progressing Goal: Diagnostic test results will improve Outcome: Progressing Goal: Respiratory complications will improve Outcome: Progressing Goal: Cardiovascular complication will be avoided Outcome: Progressing   Problem: Activity: Goal: Risk for activity intolerance will decrease Outcome: Progressing   Problem: Nutrition: Goal: Adequate nutrition will be maintained Outcome: Progressing   Problem: Coping: Goal: Level of anxiety will decrease Outcome: Progressing   Problem: Elimination: Goal: Will not experience complications related to bowel motility Outcome: Progressing Goal: Will not experience complications related to urinary retention Outcome: Progressing   Problem: Pain Managment: Goal: General experience of comfort will improve Outcome: Progressing   Problem: Safety: Goal: Ability to remain free from injury will improve Outcome: Progressing   Problem: Skin Integrity: Goal: Risk for impaired skin integrity will decrease Outcome: Progressing

## 2021-02-15 DIAGNOSIS — K8500 Idiopathic acute pancreatitis without necrosis or infection: Secondary | ICD-10-CM | POA: Diagnosis not present

## 2021-02-15 DIAGNOSIS — C259 Malignant neoplasm of pancreas, unspecified: Secondary | ICD-10-CM | POA: Diagnosis not present

## 2021-02-15 DIAGNOSIS — C787 Secondary malignant neoplasm of liver and intrahepatic bile duct: Secondary | ICD-10-CM | POA: Diagnosis not present

## 2021-02-15 LAB — COMPREHENSIVE METABOLIC PANEL WITH GFR
ALT: 11 U/L (ref 0–44)
AST: 13 U/L — ABNORMAL LOW (ref 15–41)
Albumin: 3.4 g/dL — ABNORMAL LOW (ref 3.5–5.0)
Alkaline Phosphatase: 71 U/L (ref 38–126)
Anion gap: 5 (ref 5–15)
BUN: 7 mg/dL — ABNORMAL LOW (ref 8–23)
CO2: 21 mmol/L — ABNORMAL LOW (ref 22–32)
Calcium: 8.5 mg/dL — ABNORMAL LOW (ref 8.9–10.3)
Chloride: 110 mmol/L (ref 98–111)
Creatinine, Ser: 0.81 mg/dL (ref 0.61–1.24)
GFR, Estimated: >60 mL/min
Glucose, Bld: 123 mg/dL — ABNORMAL HIGH (ref 70–99)
Potassium: 3.5 mmol/L (ref 3.5–5.1)
Sodium: 136 mmol/L (ref 135–145)
Total Bilirubin: 1 mg/dL (ref 0.3–1.2)
Total Protein: 6.3 g/dL — ABNORMAL LOW (ref 6.5–8.1)

## 2021-02-15 LAB — CBC
HCT: 28.7 % — ABNORMAL LOW (ref 39.0–52.0)
Hemoglobin: 9.9 g/dL — ABNORMAL LOW (ref 13.0–17.0)
MCH: 32 pg (ref 26.0–34.0)
MCHC: 34.5 g/dL (ref 30.0–36.0)
MCV: 92.9 fL (ref 80.0–100.0)
Platelets: 75 10*3/uL — ABNORMAL LOW (ref 150–400)
RBC: 3.09 MIL/uL — ABNORMAL LOW (ref 4.22–5.81)
RDW: 12.7 % (ref 11.5–15.5)
WBC: 2.9 10*3/uL — ABNORMAL LOW (ref 4.0–10.5)
nRBC: 0 % (ref 0.0–0.2)

## 2021-02-15 LAB — LIPASE, BLOOD: Lipase: 540 U/L — ABNORMAL HIGH (ref 11–51)

## 2021-02-15 LAB — GLUCOSE, CAPILLARY
Glucose-Capillary: 118 mg/dL — ABNORMAL HIGH (ref 70–99)
Glucose-Capillary: 121 mg/dL — ABNORMAL HIGH (ref 70–99)
Glucose-Capillary: 125 mg/dL — ABNORMAL HIGH (ref 70–99)
Glucose-Capillary: 132 mg/dL — ABNORMAL HIGH (ref 70–99)
Glucose-Capillary: 144 mg/dL — ABNORMAL HIGH (ref 70–99)
Glucose-Capillary: 146 mg/dL — ABNORMAL HIGH (ref 70–99)
Glucose-Capillary: 165 mg/dL — ABNORMAL HIGH (ref 70–99)

## 2021-02-15 MED ORDER — AMLODIPINE BESYLATE 10 MG PO TABS
10.0000 mg | ORAL_TABLET | Freq: Every day | ORAL | Status: DC
  Administered 2021-02-16 – 2021-02-25 (×10): 10 mg via ORAL
  Filled 2021-02-15 (×10): qty 1

## 2021-02-15 NOTE — Consult Note (Signed)
Consult Note   Referring Provider: TRH/Elizabeth Zigmund Daniel, MD Primary Care Physician:  [name], MD Primary Gastroenterologist:  Silvano Rusk, MD  Reason for Consultation:  pancreatitis, pancreatic cancer   HPI: Timothy Page is a 66 y.o. male with epigastric pain that worsened shortly after beginning Xeloda in mid December. He has locally advanced pancreatic cancer in the head of the pancreas. He had been maintained on an ACE inhibitor. Acute pancreatitis was diagnosed on CT on 12/30. Both the ACE inhibitor and Xeloda have been have been stopped. 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 Burr Medico recently treated with gemcitabine and Xeloda. He has ongoing epigastric pain and pain increases with clear liquid intake. His lipase increased to 540 today.    Past Medical History:  Diagnosis Date   Arthritis    Atypical chest pain 04/2017   ACS ruled out 05/11/17   CAP (community acquired pneumonia) 05/07/2017   Chronic constipation    at least partially d/t chronic opioids   Diabetes mellitus without complication ([place])    Poor control starting fall 2021-->GAD ab neg.  Insulin and C peptide levels wnl but on lower end of normal->Dr Kuma/endo 2022.   Exocrine pancreatic insufficiency 2022   History of thyroidectomy, total 2018   Hurlthe cell adenoma   Hx of adenomatous polyp of colon 07/02/2009   rpt colonoscopy 2026   Hyperlipidemia    Intol of multiple statins and zetia. [facility] 2022.   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  Cammy Copa   Right ureteral calculus 04/2018   Cystoscopy with right retrograde pyelogram and right ureteral stent placement after stone removal.   Seasonal allergies    Stroke ([place])    in 72I no complications    Tachyarrhythmia 1980   age 68 X 2 over 50 month period.  No recurrence since that time.   Thoracic aortic aneurysm    Incidentally discovered, 4.1 cm-->Dr. Cyndia Bent. Rpt CT 08/2018 and 08/2019 stable.   Vocal cord paralysis 07/2016   Left vocal cord paralysis noted after thyroid surgery.    Past Surgical History:  Procedure Laterality Date   BIOPSY  06/18/2020   Procedure: BIOPSY;  Surgeon: Rush Landmark Telford Nab., MD;  Location: [facility] ENDOSCOPY;  Service: Gastroenterology;;   [place] W/ POLYPECTOMY  07/02/2009; 01/24/20   2011 adenoma.  2021 adenomas x 3->recall 2026   CYSTOSCOPY WITH RETROGRADE PYELOGRAM, URETEROSCOPY AND STENT PLACEMENT Right 05/09/2018   Procedure: CYSTOSCOPY WITH RIGHT RETROGRADE RIGHT URETEROSCOPY STONE EXTRACTION RIGHT STENT EXCHANGE;  Surgeon: Lucas Mallow, MD;  Location: [facility] ORS;  Service: Urology;  Laterality: Right;   CYSTOSCOPY/RETROGRADE/URETEROSCOPY/STONE EXTRACTION WITH BASKET Right 04/30/2018   Procedure: CYSTOSCOPY/RETROGRADE/RIGHT URETEROSCOPY/;  Surgeon: Lucas Mallow, MD;  Location: [facility] ORS;  Service: Urology;  Laterality: Right;   ESOPHAGOGASTRODUODENOSCOPY  07/02/2009   NORMAL   ESOPHAGOGASTRODUODENOSCOPY (EGD) WITH PROPOFOL N/A 06/18/2020   Procedure: ESOPHAGOGASTRODUODENOSCOPY (EGD) WITH PROPOFOL;  Surgeon: Irving Copas., MD;  Location: [place];  Service: Gastroenterology;  Laterality: N/A;   EYE SURGERY     FINE NEEDLE ASPIRATION  06/18/2020   Procedure: FINE NEEDLE ASPIRATION (FNA) LINEAR;  Surgeon: Irving Copas., MD;  Location: [place];  Service: Gastroenterology;;   finger amputaion     with reattachement   INCISION AND DRAINAGE ABSCESS ANAL  11/23/2019   gen surg (In  ED).   IR IMAGING GUIDED PORT INSERTION  06/22/2020   LASIK     THYROIDECTOMY N/A 08/03/2016   Procedure: Total THYROIDECTOMY;  Surgeon: [name], MD;  Location: [place];  Service: ENT;  Laterality: N/A;  Total Thyroidectomy    UPPER ESOPHAGEAL ENDOSCOPIC ULTRASOUND (EUS) N/A 06/18/2020   Procedure: UPPER ESOPHAGEAL ENDOSCOPIC ULTRASOUND (EUS);  Surgeon: Irving Copas., MD;  Location: [place];  Service: Gastroenterology;  Laterality: N/A;   UPPER GASTROINTESTINAL ENDOSCOPY     with EUS and bx of pancreatic mass. +appearance of candida in esoph.      Prior to Admission medications   Medication Sig Start Date End Date Taking? Authorizing Provider  apixaban (ELIQUIS) 5 MG TABS tablet TAKE 1 TABLET(5 MG) BY MOUTH TWICE DAILY Patient taking differently: Take 5 mg by mouth 2 (two) times daily. 01/05/21  Yes Truitt Merle, MD  docusate sodium (COLACE) 100 MG capsule Take 100 mg by mouth 2 (two) times daily.   Yes [provider]  HUMALOG KWIKPEN 100 UNIT/ML KwikPen Inject 20-22 Units into the skin daily. 07/23/20  Yes [provider]  HYDROmorphone (DILAUDID) 2 MG tablet Take 1 tablet (2 mg total) by mouth every 6 (six) hours as needed for severe pain. 02/12/21  Yes Pickenpack-Cousar, Carlena Sax, NP  insulin glargine (LANTUS SOLOSTAR) 100 UNIT/ML Solostar Pen Give up to 10 units under the skin every evening at bedtime. 06/22/20  Yes Charlynne Cousins, MD  lactulose (CHRONULAC) 10 GM/15ML solution Take 15 mLs (10 g total) by mouth 3 (three) times daily as needed for mild constipation. 10/06/20  Yes [name], MD  levothyroxine (SYNTHROID) 137 MCG tablet TAKE 1 TABLET BY MOUTH  DAILY BEFORE BREAKFAST Patient taking differently: Take 137 mcg by mouth daily before breakfast. 06/03/20  Yes Elayne Snare, MD  LORazepam (ATIVAN) 0.5 MG tablet TAKE 1 TABLET(0.5 MG) BY MOUTH EVERY 12 HOURS AS NEEDED FOR ANXIETY Patient taking differently: Take 0.5 mg by mouth every 12 (twelve) hours as  needed for anxiety. 02/10/21  Yes Truitt Merle, MD  magic mouthwash (nystatin, lidocaine, diphenhydrAMINE, alum & mag hydroxide) suspension Swish and swallow 5 mls by mouth every 6 hours as needed 01/25/21  Yes Truitt Merle, MD  magnesium hydroxide (MILK OF MAGNESIA) 400 MG/5ML suspension Take by mouth daily as needed for mild constipation.   Yes [provider]  polyethylene glycol (MIRALAX / GLYCOLAX) 17 g packet Take 17 g by mouth daily.   Yes [provider]  prochlorperazine (COMPAZINE) 10 MG tablet TAKE 1 TABLET(10 MG) BY MOUTH EVERY 6 HOURS AS NEEDED FOR NAUSEA OR VOMITING Patient taking differently: Take 10 mg by mouth every 6 (six) hours as needed. TAKE 1 TABLET(10 MG) BY MOUTH EVERY 6 HOURS AS NEEDED FOR NAUSEA OR VOMITING 02/01/21  Yes Truitt Merle, MD  ramipril (ALTACE) 10 MG capsule Take 10 mg by mouth 2 (two) times daily.   Yes [provider]  capecitabine (XELODA) 500 MG tablet TAKE 3 TABLETS BY MOUTH EVERY 12 HOURS - TAKE WITHIN 30 MINUTES  AFTER MEALS FOR 7 DAYS ON, THEN  7 DAYS OFF, REPEAT  EVERY 14 DAYS Patient not taking: Reported on 02/12/2021 02/09/21   Truitt Merle, MD  Continuous Blood Gluc Sensor (FREESTYLE LIBRE 2 SENSOR) MISC APPLY 1 SENSOR TO THE SKIN EVERY 14 DAYS AS DIRECTED 01/14/21   Elayne Snare, MD  gabapentin (NEURONTIN) 100 MG capsule Take 1 capsule (100 mg total) by mouth 3 (three) times daily. Take during day, in additional to 300mg  capsules at evening Patient not taking: Reported on 02/12/2021 01/19/21   Truitt Merle, MD  gabapentin (NEURONTIN) 300 MG capsule Take 1-2 capsules (300-600 mg total) by mouth 3 (three) times daily. Patient not taking: Reported on 02/12/2021 01/05/21   Truitt Merle, MD  Insulin Pen Needle 31G X 5 MM MISC Use with insulin pen 3 times a day 04/24/20   Elayne Snare, MD  lipase/protease/amylase (CREON) 36000 UNITS CPEP capsule Take 2 capsules (72,000 Units total) by mouth 3 (three) times daily with meals. May also take 1 capsule  (36,000 Units total) as needed (with snacks). Patient not taking: Reported on 02/12/2021 06/30/20   [name], MD    Current Facility-Administered Medications  Medication Dose Route Frequency Provider Last Rate Last Admin   0.9 %  sodium chloride infusion   Intravenous Continuous Blount, Scarlette Shorts T, NP 50 mL/hr at 02/15/21 0600 Infusion Verify at 02/15/21 0600   acetaminophen (TYLENOL) tablet 650 mg  650 mg Oral Q6H PRN Toy Baker, MD       Or   acetaminophen (TYLENOL) suppository 650 mg  650 mg Rectal Q6H PRN Doutova, Anastassia, MD       amLODipine (NORVASC) tablet 5 mg  5 mg Oral Daily Georgette Shell, MD   5 mg at 02/15/21 1019   apixaban (ELIQUIS) tablet 5 mg  5 mg Oral BID Toy Baker, MD   5 mg at 02/15/21 1019   Chlorhexidine Gluconate Cloth 2 % PADS 6 each  6 each Topical Daily Doutova, Anastassia, MD   6 each at 02/14/21 0941   HYDROcodone-acetaminophen (NORCO/VICODIN) 5-325 MG per tablet 1-2 tablet  1-2 tablet Oral Q4H PRN Toy Baker, MD       HYDROmorphone (DILAUDID) injection 1-2 mg  1-2 mg Intravenous Q3H PRN Georgette Shell, MD   1 mg at 02/15/21 1132   insulin aspart (novoLOG) injection 0-9 Units  0-9 Units Subcutaneous Q4H Toy Baker, MD   1 Units at 02/15/21 7035   levothyroxine (SYNTHROID) tablet 137 mcg  137 mcg Oral Q0600 Toy Baker, MD   137 mcg at 02/15/21 0539   lipase/protease/amylase (CREON) capsule 36,000 Units  36,000 Units Oral TID Mathews Robinsons, MD   36,000 Units at 02/15/21 1132   LORazepam (ATIVAN) tablet 0.5 mg  0.5 mg Oral BID PRN Toy Baker, MD   0.5 mg at 02/15/21 1132   prochlorperazine (COMPAZINE) tablet 10 mg  10 mg Oral BID Georgette Shell, MD   10 mg at 02/15/21 1019   sodium chloride flush (NS) 0.9 % injection 10-40 mL  10-40 mL Intracatheter PRN Toy Baker, MD        Allergies as of 02/12/2021 - Review Complete 02/12/2021  Allergen Reaction Noted   Atenolol   04/28/2011   Hydroxyzine Other (See Comments) 05/18/2017   Tetracycline     Atorvastatin  11/23/2012   Codeine  05/07/2017   Guaifenesin  08/12/2016   Mucinex [guaifenesin er] Other (See Comments) 05/07/2017    Family History  Problem Relation Age of Onset   Diabetes Mother    Cancer Mother  lung   COPD Mother    Brain cancer Mother    Arthritis Mother    Hyperlipidemia Mother    Heart disease Mother    Hypertension Mother    Diabetes Father    Cancer Father        mesothelioma; skin   Mental illness Father    Arthritis Father    Hyperlipidemia Father    Heart disease Father    Hypertension Father    Heart disease Maternal Grandmother        MI @ 5   Heart disease Maternal Grandfather        MI @ 24   Diabetes Paternal Grandmother    Dementia Paternal Grandmother    Heart disease Paternal Grandfather        MI @ 36   Colon cancer Neg Hx    Esophageal cancer Neg Hx    Rectal cancer Neg Hx    Stomach cancer Neg Hx     Social History   Socioeconomic History   Marital status: Married    Spouse name: Not on file   Number of children: Not on file   Years of education: Not on file   Highest education level: Not on file  Occupational History   Not on file  Tobacco Use   Smoking status: Never   Smokeless tobacco: Current    Types: Snuff  Vaping Use   Vaping Use: Never used  Substance and Sexual Activity   Alcohol use: No   Drug use: No   Sexual activity: Not on file  Other Topics Concern   Not on file  Social History Narrative   Married, 2 daughters, 1 grandson that lives with him.   Educ: some college   Occup: retired from the [employer].  Retired at 6 but returned to work at some point, now with grandson living with him.   No T/A/Ds.   Social Determinants of Health   Financial Resource Strain: Not on file  Food Insecurity: Not on file  Transportation Needs: Not on file  Physical Activity: Not on file  Stress: Not on file  Social  Connections: Not on file  Intimate Partner Violence: Not on file    Review of Systems: Gen: Denies any fever, chills, sweats, anorexia, fatigue, weakness, malaise, weight loss, and sleep disorder CV: Denies chest pain, angina, palpitations, syncope, orthopnea, PND, peripheral edema, and claudication. Resp: Denies dyspnea at rest, dyspnea with exercise, cough, sputum, wheezing, coughing up blood, and pleurisy. GI: Denies vomiting blood, jaundice, and fecal incontinence.   Denies dysphagia or odynophagia. GU : Denies urinary burning, blood in urine, urinary frequency, urinary hesitancy, nocturnal urination, and urinary incontinence. MS: Denies joint pain, limitation of movement, and swelling, stiffness, low back pain, extremity pain. Denies muscle weakness, cramps, atrophy.  Derm: Denies rash, itching, dry skin, hives, moles, warts, or unhealing ulcers.  Psych: Denies depression, anxiety, memory loss, suicidal ideation, hallucinations, paranoia, and confusion. Heme: Denies bruising, bleeding, and enlarged lymph nodes. Neuro:  Denies any headaches, dizziness, paresthesias. Endo:  Denies any problems with DM, thyroid, adrenal function.  Physical Exam: Vital signs in last 24 hours: Temp:  [98.1 F (36.7 C)-98.5 F (36.9 C)] 98.1 F (36.7 C) (01/02 0400) Pulse Rate:  [73-82] 73 (01/02 0400) Resp:  [16-20] 16 (01/02 0400) BP: (145-175)/(84-94) 145/84 (01/02 0400) SpO2:  [96 %-98 %] 96 % (01/02 0400) Last BM Date: 02/12/21 (per pt)  General:  Alert, well-developed, well-nourished, in NAD Head:  Normocephalic and atraumatic.  Eyes:  Sclera clear, no icterus. Conjunctiva pink. Ears:  Normal auditory acuity. Nose:  No deformity, discharge, or lesions. Mouth:  No deformity or lesions. Oropharynx pink & moist. Neck:  Supple; no masses or thyromegaly. Chest:  Clear throughout to auscultation. No wheezes, crackles, or rhonchi. No acute distress. Heart:  Regular rate and rhythm; no murmurs,  clicks, rubs, or gallops. Abdomen:  Soft, nontender and nondistended. No masses, hepatosplenomegaly or hernias noted. Normal bowel sounds, without guarding, and without rebound.   Rectal:  Deferred until time of colonoscopy.   Msk:  Symmetrical without gross deformities. Normal posture. Pulses:  Normal pulses noted. Extremities:  Without clubbing or edema. Neurologic:  Alert and  oriented x4;  grossly normal neurologically. Skin:  Intact without significant lesions or rashes. Cervical Nodes:  No significant cervical adenopathy. Psych:  Alert and cooperative. Normal mood and affect.  Intake/Output from previous day: 01/01 0701 - 01/02 0700 In: 2226 [I.V.:2226] Out: -  Intake/Output this shift: No intake/output data recorded.  Lab Results: Recent Labs    02/13/21 0500 02/14/21 0502 02/15/21 0540  WBC 3.0* 3.0* 2.9*  HGB 10.4* 9.7* 9.9*  HCT 31.2* 29.2* 28.7*  PLT 88* 73* 75*   BMET Recent Labs    02/13/21 0844 02/14/21 0502 02/15/21 0540  NA 135 137 136  K 4.1 3.8 3.5  CL 105 106 110  CO2 21* 20* 21*  GLUCOSE 149* 116* 123*  BUN 14 12 7*  CREATININE 0.89 0.73 0.81  CALCIUM 8.7* 8.7* 8.5*   LFT Recent Labs    02/15/21 0540  PROT 6.3*  ALBUMIN 3.4*  AST 13*  ALT 11  ALKPHOS 71  BILITOT 1.0    Impression/ Recommendations: Acute pancreatitis with locally advanced pancreatic cancer in the head of the pancreas. Both could be causing epigastric pain. It appears pancreatitis started soon after beginning Xeloda. ACE inhibitors and Xeloda have been associated with pancreatitis and both have been stopped. Recommend not resuming either. Supportive care, pain control, clear liquids and advance as tolerated. Remain off potential offending medications. Stage IV pancreatic cancer with liver and lung mets followed by Dr. Burr Medico recently treated with gemcitabine and Xeloda. PVT on Eliquis.    LOS: 3 days   Norberto Sorenson T. Fuller Plan  MD 02/15/2021, 1:49 PM See Shea Evans, [facility], to  contact our on call provider

## 2021-02-15 NOTE — Telephone Encounter (Signed)
Currently, patient is admitted and this refill request was done 2 weeks ago. Gardiner Rhyme, RN

## 2021-02-15 NOTE — Progress Notes (Signed)
Mobility Specialist - Progress Note    02/15/21 1546  Mobility  Activity Ambulated in hall  Level of Assistance Independent after set-up  Assistive Device Other (Comment) (IV Pole)  Distance Ambulated (ft) 250 ft  Mobility Ambulated independently in hallway  Mobility Response Tolerated well  Mobility performed by Mobility specialist  $Mobility charge 1 Mobility   Pt agreeable to mobilize this afternoon. Pushed IV pole while ambulating ~250 ft. No complaints made during session. Pt returned to room and left sitting up in recliner with family in room.   [place] Specialist [facility] Services Phone: [phone] 02/15/21, 3:47 PM

## 2021-02-15 NOTE — Progress Notes (Signed)
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 CT abdomen and pelvis findings consistent with acute interstitial edematous pancreatitis. Assessment & Plan:   Principal Problem:   Pancreatitis Active Problems:   Essential hypertension   Mixed hyperlipidemia   Controlled type 2 diabetes mellitus without complication, without long-term current use of insulin (HCC)   Nausea without vomiting   Thrombocytopenia (HCC)   Postoperative hypothyroidism   Anemia associated with chemotherapy   #1 acute pancreatitis in the setting of stage IV pancreatic cancer with mets to the liver and lungs.   Upon admission his lipase was normal then increased to over 540 with clear liquids. CT findings consistent with acute interstitial edematous pancreatitis.  Patient admitted with severe abdominal pain nausea and vomiting.   History of stage IV pancreatic cancer with mets to the liver and lungs.  He was undergoing chemo prior to admission. Lipid panel triglyceride levels are 72 LDL is 102 ACE inhibitor's were stopped on admission thinking that could be the culprit for pancreatitis Continue IV fluids and pain control with Dilaudid. Appreciate GI input concern of ACE inhibitor and Xeloda causing pancreatitis. Will discuss with oncology.  #2 type 2 diabetes on sliding scale insulin  CBG (last 3)  Recent Labs    02/15/21 0439 02/15/21 0758 02/15/21 1150  GLUCAP 125* 121* 146*      #3 history of essential hypertension-his blood pressure has been trending up to 170/99.  Increase Norvasc to 10 mg daily.  Will not  restart ACE inhibitor on discharge due to concern for pancreatitis.  #4 hypothyroidism continue Synthroid  #5 hyperlipidemia LDL elevated 102 he is being on statins in the past which he stopped himself due to severe myalgias.  #6 anemia chronic stable monitor  #7  Pancytopenia /thrombocytopenia platelets trending down to 73 from 88 from 172.  Will closely monitor.  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   Estimated body mass index is 26.54 kg/m as calculated from the following:   Height as of this encounter: 5\' 9"  (1.753 m).   Weight as of this encounter: 81.5 kg.  DVT prophylaxis:eliquis Code Status: dnr Family Communication: dw wife  Disposition Plan:  Status is: Inpatient  Remains inpatient appropriate because: ivf    Consultants: none  Procedures:none Antimicrobials none  Subjective:  Still with complaints of a lot of pain in his abdomen pain increases with clear liquids Appears in distress due to pain wife by the bedside very concerned Objective: Vitals:   02/14/21 1354 02/14/21 1945 02/15/21 0400 02/15/21 1456  BP: (!) 165/92 (!) 175/94 (!) 145/84 (!) 170/99  Pulse: 82 82 73 86  Resp: 18 20 16 16   Temp: 98.5 F (36.9 C) 98.2 F (36.8 C) 98.1 F (36.7 C) 98.8 F (37.1 C)  TempSrc: Oral Oral Oral Oral  SpO2: 98% 98% 96% 96%  Weight:      Height:        Intake/Output Summary (Last 24 hours) at 02/15/2021 1514 Last data filed at 02/15/2021 0600 Gross per 24 hour  Intake 2226.03 ml  Output --  Net 2226.03 ml    [employer]  02/12/21 1633  Weight: 81.5 kg    Examination:  General exam: Appears in distress  respiratory system: Clear to auscultation. Respiratory effort normal. Cardiovascular system: S1 & S2 heard, RRR. No JVD, murmurs, rubs, gallops or clicks. No pedal edema. Gastrointestinal system: Abdomen is nondistended, soft and tender. No organomegaly or masses felt. Normal bowel sounds heard.  Guarding  present Central nervous system: Alert and oriented. No focal neurological deficits. Extremities: Symmetric 5 x 5 power. Skin: No rashes, lesions or ulcers Psychiatry: Judgement and insight appear normal. Mood & affect appropriate.     Data Reviewed: I have personally reviewed following labs and imaging studies  CBC: Recent Labs  Lab 02/12/21 1733 02/13/21 0500 02/14/21 0502 02/15/21 0540  WBC 2.7* 3.0* 3.0* 2.9*  NEUTROABS 1.4* 1.6*  --   --   HGB 10.5* 10.4* 9.7* 9.9*  HCT 31.1* 31.2* 29.2* 28.7*  MCV 92.8 94.0 94.2 92.9  PLT 93* 88* 73* 75*    Basic Metabolic Panel: Recent Labs  Lab 02/12/21 1733 02/12/21 2000 02/13/21 0500 02/13/21 0844 02/13/21 1440 02/14/21 0502 02/15/21 0540  NA 135  --  141 135  --  137 136  K 3.8  --  2.1* 4.1  --  3.8 3.5  CL 109  --  125* 105  --  106 110  CO2 21*  --  13* 21*  --  20* 21*  GLUCOSE 124*  --  82 149*  --  116* 123*  BUN 13  --  8 14  --  12 7*  CREATININE 0.87  --  0.45* 0.89  --  0.73 0.81  CALCIUM 8.4*  --  4.5* 8.7*  --  8.7* 8.5*  MG  --  2.0 1.1*  --  1.9  --   --   PHOS  --  4.1 1.9*  --  3.7  --   --     GFR: Estimated Creatinine Clearance: 92.1 mL/min (by C-G formula based on SCr of 0.81 mg/dL). Liver Function Tests: Recent Labs  Lab 02/12/21 1733 02/13/21 0500 02/13/21 0844 02/14/21 0502 02/15/21 0540  AST 14* 7* 12* 12* 13*  ALT 12 6 11 10 11   ALKPHOS 75 37* 76 73 71  BILITOT 0.6 0.4 0.9 0.8 1.0  PROT 6.8 3.2* 6.8 6.5 6.3*  ALBUMIN 3.8 1.9* 3.8 3.5 3.4*    Recent Labs  Lab 02/12/21 1733 02/14/21 0502 02/15/21 0540  LIPASE 21 23 540*    No results for input(s): AMMONIA in the last 168 hours. Coagulation Profile: No results for input(s): INR, PROTIME in the last 168 hours. Cardiac Enzymes: Recent Labs  Lab 02/12/21 2000  CKTOTAL 53    BNP (last 3 results) No results for input(s): PROBNP in the last 8760 hours. HbA1C: Recent Labs    02/12/21 2000  HGBA1C 7.4*    CBG: Recent  Labs  Lab 02/14/21 1946 02/15/21 0021 02/15/21 0439 02/15/21 0758 02/15/21 1150  GLUCAP 129* 118* 125* 121* 146*    Lipid Profile: Recent Labs    02/13/21 0330  CHOL 148  HDL 32*  LDLCALC 102*  TRIG 72  CHOLHDL 4.6    Thyroid Function Tests: Recent Labs    02/13/21 0500  TSH 0.664    Anemia Panel: Recent Labs    02/12/21 2000  VITAMINB12 364  FOLATE 18.2  FERRITIN 212  TIBC 224*  IRON 34*  RETICCTPCT 1.9    Sepsis Labs: Recent Labs  Lab 02/12/21 2000  LATICACIDVEN 0.7  Recent Results (from the past 240 hour(s))  Resp Panel by RT-PCR (Flu A&B, Covid) Nasopharyngeal Swab     Status: None   Collection Time: 02/12/21  5:49 PM   Specimen: Nasopharyngeal Swab; Nasopharyngeal(NP) swabs in vial transport medium  Result Value Ref Range Status   SARS Coronavirus 2 by RT PCR NEGATIVE NEGATIVE Final    Comment: (NOTE) SARS-CoV-2 target nucleic acids are NOT DETECTED.  The SARS-CoV-2 RNA is generally detectable in upper respiratory specimens during the acute phase of infection. The lowest concentration of SARS-CoV-2 viral copies this assay can detect is 138 copies/mL. A negative result does not preclude SARS-Cov-2 infection and should not be used as the sole basis for treatment or other patient management decisions. A negative result may occur with  improper specimen collection/handling, submission of specimen other than nasopharyngeal swab, presence of viral mutation(s) within the areas targeted by this assay, and inadequate number of viral copies(<138 copies/mL). A negative result must be combined with clinical observations, patient history, and epidemiological information. The expected result is Negative.  Fact Sheet for Patients:  EntrepreneurPulse.com.au  Fact Sheet for Healthcare Providers:  IncredibleEmployment.be  This test is no t yet approved or cleared by the [place] FDA and  has been authorized for  detection and/or diagnosis of SARS-CoV-2 by FDA under an Emergency Use Authorization (EUA). This EUA will remain  in effect (meaning this test can be used) for the duration of the COVID-19 declaration under Section 564(b)(1) of the Act, 21 U.S.C.section 360bbb-3(b)(1), unless the authorization is terminated  or revoked sooner.       Influenza A by PCR NEGATIVE NEGATIVE Final   Influenza B by PCR NEGATIVE NEGATIVE Final    Comment: (NOTE) The Xpert Xpress SARS-CoV-2/FLU/RSV plus assay is intended as an aid in the diagnosis of influenza from Nasopharyngeal swab specimens and should not be used as a sole basis for treatment. Nasal washings and aspirates are unacceptable for Xpert Xpress SARS-CoV-2/FLU/RSV testing.  Fact Sheet for Patients: EntrepreneurPulse.com.au  Fact Sheet for Healthcare Providers: IncredibleEmployment.be  This test is not yet approved or cleared by the [place] FDA and has been authorized for detection and/or diagnosis of SARS-CoV-2 by FDA under an Emergency Use Authorization (EUA). This EUA will remain in effect (meaning this test can be used) for the duration of the COVID-19 declaration under Section 564(b)(1) of the Act, 21 U.S.C. section 360bbb-3(b)(1), unless the authorization is terminated or revoked.  Performed at [facility], [place] [address], [place], [place] [address]           Radiology Studies: No results found.      Scheduled Meds:  amLODipine  5 mg Oral Daily   apixaban  5 mg Oral BID   Chlorhexidine Gluconate Cloth  6 each Topical Daily   insulin aspart  0-9 Units Subcutaneous Q4H   levothyroxine  137 mcg Oral Q0600   lipase/protease/amylase  36,000 Units Oral TID AC   prochlorperazine  10 mg Oral BID   Continuous Infusions:  sodium chloride 50 mL/hr at 02/15/21 0600     LOS: 3 days    Time spent:45 min  Georgette Shell, MD 02/15/2021, 3:14 PM

## 2021-02-16 DIAGNOSIS — C259 Malignant neoplasm of pancreas, unspecified: Secondary | ICD-10-CM | POA: Diagnosis not present

## 2021-02-16 DIAGNOSIS — C250 Malignant neoplasm of head of pancreas: Secondary | ICD-10-CM

## 2021-02-16 DIAGNOSIS — K8500 Idiopathic acute pancreatitis without necrosis or infection: Secondary | ICD-10-CM | POA: Diagnosis not present

## 2021-02-16 LAB — CBC
HCT: 29.2 % — ABNORMAL LOW (ref 39.0–52.0)
Hemoglobin: 10.1 g/dL — ABNORMAL LOW (ref 13.0–17.0)
MCH: 32.3 pg (ref 26.0–34.0)
MCHC: 34.6 g/dL (ref 30.0–36.0)
MCV: 93.3 fL (ref 80.0–100.0)
Platelets: 82 10*3/uL — ABNORMAL LOW (ref 150–400)
RBC: 3.13 MIL/uL — ABNORMAL LOW (ref 4.22–5.81)
RDW: 13.1 % (ref 11.5–15.5)
WBC: 2.7 10*3/uL — ABNORMAL LOW (ref 4.0–10.5)
nRBC: 0 % (ref 0.0–0.2)

## 2021-02-16 LAB — COMPREHENSIVE METABOLIC PANEL
ALT: 11 U/L (ref 0–44)
AST: 12 U/L — ABNORMAL LOW (ref 15–41)
Albumin: 3.5 g/dL (ref 3.5–5.0)
Alkaline Phosphatase: 71 U/L (ref 38–126)
Anion gap: 7 (ref 5–15)
BUN: 6 mg/dL — ABNORMAL LOW (ref 8–23)
CO2: 23 mmol/L (ref 22–32)
Calcium: 8.8 mg/dL — ABNORMAL LOW (ref 8.9–10.3)
Chloride: 108 mmol/L (ref 98–111)
Creatinine, Ser: 0.83 mg/dL (ref 0.61–1.24)
GFR, Estimated: >60 mL/min (ref >60)
Glucose, Bld: 120 mg/dL — ABNORMAL HIGH (ref 70–99)
Potassium: 3.2 mmol/L — ABNORMAL LOW (ref 3.5–5.1)
Sodium: 138 mmol/L (ref 135–145)
Total Bilirubin: 0.8 mg/dL (ref 0.3–1.2)
Total Protein: 6.2 g/dL — ABNORMAL LOW (ref 6.5–8.1)

## 2021-02-16 LAB — GLUCOSE, CAPILLARY
Glucose-Capillary: 114 mg/dL — ABNORMAL HIGH (ref 70–99)
Glucose-Capillary: 139 mg/dL — ABNORMAL HIGH (ref 70–99)
Glucose-Capillary: 140 mg/dL — ABNORMAL HIGH (ref 70–99)
Glucose-Capillary: 144 mg/dL — ABNORMAL HIGH (ref 70–99)
Glucose-Capillary: 149 mg/dL — ABNORMAL HIGH (ref 70–99)
Glucose-Capillary: 141 mg/dL — ABNORMAL HIGH (ref 70–99)
Glucose-Capillary: 137 mg/dL — ABNORMAL HIGH (ref 70–99)
Glucose-Capillary: 155 mg/dL — ABNORMAL HIGH (ref 70–99)

## 2021-02-16 LAB — MAGNESIUM: Magnesium: 1.8 mg/dL (ref 1.7–2.4)

## 2021-02-16 LAB — LIPASE, BLOOD: Lipase: 20 U/L (ref 11–51)

## 2021-02-16 MED ORDER — BISACODYL 5 MG PO TBEC
10.0000 mg | DELAYED_RELEASE_TABLET | Freq: Every day | ORAL | Status: AC
  Administered 2021-02-16 – 2021-02-18 (×3): 10 mg via ORAL
  Filled 2021-02-16 (×3): qty 2

## 2021-02-16 MED ORDER — POTASSIUM CHLORIDE 10 MEQ/100ML IV SOLN
10.0000 meq | INTRAVENOUS | Status: AC
  Administered 2021-02-16 (×4): 10 meq via INTRAVENOUS
  Filled 2021-02-16 (×4): qty 100

## 2021-02-16 NOTE — Progress Notes (Signed)
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 CT abdomen and pelvis findings consistent with acute interstitial edematous pancreatitis. Assessment & Plan:   Principal Problem:   Pancreatitis Active Problems:   Essential hypertension   Mixed hyperlipidemia   Controlled type 2 diabetes mellitus without complication, without long-term current use of insulin (HCC)   Nausea without vomiting   Thrombocytopenia (HCC)   Postoperative hypothyroidism   Anemia associated with chemotherapy   #1 acute pancreatitis in the setting of stage IV pancreatic cancer with mets to the liver and lungs.   Upon admission his lipase was normal then increased to over 540 with clear liquids. CT findings consistent with acute interstitial edematous pancreatitis.  Patient admitted with severe abdominal pain nausea and vomiting.   History of stage IV pancreatic cancer with mets to the liver and lungs.  He was undergoing chemo prior to admission. Lipid panel triglyceride levels are 72 LDL is 102 ACE inhibitor and Xeloda Due to concern as they may have caused pancreatitis.   Continue IV fluids and pain control with Dilaudid. Appreciate GI input Oncology to see the patient today.  #2 type 2 diabetes on sliding scale insulin  CBG (last 3)  Recent Labs    02/15/21 2318 02/16/21 0350 02/16/21 0822  GLUCAP 132* 114* 149*      #3 history of essential hypertension-blood pressure improved after increasing Norvasc to 10 mg daily.  Will not restart ACE inhibitor on discharge due to concern for pancreatitis.    #4 hypothyroidism  continue Synthroid  #5 hyperlipidemia LDL elevated 102 he is being on statins in the past which he stopped himself due to severe myalgias.  #6 anemia chronic stable monitor  #7  Pancytopenia likely from recent chemotherapy.  Monitor daily.  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   Estimated body mass index is 26.54 kg/m as calculated from the following:   Height as of this encounter: 5\' 9"  (1.753 m).   Weight as of this encounter: 81.5 kg.  DVT prophylaxis:eliquis Code Status: dnr Family Communication: dw wife  Disposition Plan:  Status is: Inpatient  Remains inpatient appropriate because: ivf    Consultants: none  Procedures:none Antimicrobials none  Subjective:  He tried clear liquids yesterday but his pain got worse his lipase is back to normal however he continues with severe pain  Objective: Vitals:   02/15/21 0400 02/15/21 1456 02/15/21 2011 02/16/21 0347  BP: (!) 145/84 (!) 170/99 (!) 152/90 139/78  Pulse: 73 86 82 80  Resp: 16 16 18 18   Temp: 98.1 F (36.7 C) 98.8 F (37.1 C) 98.5 F (36.9 C) 98.4 F (36.9 C)  TempSrc: Oral Oral Oral Oral  SpO2: 96% 96% 98% 96%  Weight:      Height:        Intake/Output Summary (Last 24 hours) at 02/16/2021 1103 Last data filed at 02/16/2021 0900 Gross per 24 hour  Intake 2524.24 ml  Output --  Net 2524.24 ml    [employer]   02/12/21 1633  Weight: 81.5 kg    Examination:  General exam: Appears in distress  respiratory system: Clear to auscultation.  Respiratory effort normal. Cardiovascular system: S1 & S2 heard, RRR. No JVD, murmurs, rubs, gallops or clicks. No pedal edema. Gastrointestinal system: Abdomen is nondistended, soft and tender. No organomegaly or masses felt. Normal bowel sounds heard.  Guarding present Central nervous system: Alert and oriented. No focal neurological deficits. Extremities: Symmetric 5 x 5 power. Skin: No rashes, lesions or ulcers Psychiatry:  Judgement and insight appear normal. Mood & affect appropriate.     Data Reviewed: I have personally reviewed following labs and imaging studies  CBC: Recent Labs  Lab 02/12/21 1733 02/13/21 0500 02/14/21 0502 02/15/21 0540 02/16/21 0424  WBC 2.7* 3.0* 3.0* 2.9* 2.7*  NEUTROABS 1.4* 1.6*  --   --   --   HGB 10.5* 10.4* 9.7* 9.9* 10.1*  HCT 31.1* 31.2* 29.2* 28.7* 29.2*  MCV 92.8 94.0 94.2 92.9 93.3  PLT 93* 88* 73* 75* 82*    Basic Metabolic Panel: Recent Labs  Lab 02/12/21 2000 02/13/21 0500 02/13/21 0844 02/13/21 1440 02/14/21 0502 02/15/21 0540 02/16/21 0424  NA  --  141 135  --  137 136 138  K  --  2.1* 4.1  --  3.8 3.5 3.2*  CL  --  125* 105  --  106 110 108  CO2  --  13* 21*  --  20* 21* 23  GLUCOSE  --  82 149*  --  116* 123* 120*  BUN  --  8 14  --  12 7* 6*  CREATININE  --  0.45* 0.89  --  0.73 0.81 0.83  CALCIUM  --  4.5* 8.7*  --  8.7* 8.5* 8.8*  MG 2.0 1.1*  --  1.9  --   --  1.8  PHOS 4.1 1.9*  --  3.7  --   --   --     GFR: Estimated Creatinine Clearance: 89.9 mL/min (by C-G formula based on SCr of 0.83 mg/dL). Liver Function Tests: Recent Labs  Lab 02/13/21 0500 02/13/21 0844 02/14/21 0502 02/15/21 0540 02/16/21 0424  AST 7* 12* 12* 13* 12*  ALT 6 11 10 11 11   ALKPHOS 37* 76 73 71 71  BILITOT 0.4 0.9 0.8 1.0 0.8  PROT 3.2* 6.8 6.5 6.3* 6.2*  ALBUMIN 1.9* 3.8 3.5 3.4* 3.5    Recent Labs  Lab 02/12/21 1733 02/14/21 0502 02/15/21 0540 02/16/21 0424  LIPASE 21 23 540* 20    No results for input(s): AMMONIA in the last 168 hours. Coagulation Profile: No results for input(s): INR, PROTIME in the last 168 hours. Cardiac Enzymes: Recent Labs  Lab 02/12/21 2000  CKTOTAL 53    BNP (last 3 results) No results for input(s): PROBNP in the last 8760 hours. HbA1C: No results for input(s): HGBA1C in the last 72 hours.  CBG: Recent Labs  Lab 02/15/21 1639 02/15/21 2012 02/15/21 2318 02/16/21 0350 02/16/21 0822  GLUCAP 165*  144* 132* 114* 149*    Lipid Profile: No results for input(s): CHOL, HDL, LDLCALC, TRIG, CHOLHDL, LDLDIRECT in the last 72 hours.  Thyroid Function Tests: No results for input(s): TSH, T4TOTAL, FREET4, T3FREE, THYROIDAB in the last 72 hours.  Anemia Panel: No results for input(s): VITAMINB12, FOLATE, FERRITIN, TIBC, IRON, RETICCTPCT in the last 72 hours.  Sepsis Labs: Recent Labs  Lab 02/12/21 2000  LATICACIDVEN 0.7     Recent Results (from the past 240 hour(s))  Resp Panel by RT-PCR (Flu A&B, Covid) Nasopharyngeal Swab     Status: None   Collection Time: 02/12/21  5:49 PM   Specimen: Nasopharyngeal Swab; Nasopharyngeal(NP) swabs in vial transport medium  Result Value Ref Range Status   SARS Coronavirus 2 by RT PCR NEGATIVE NEGATIVE Final    Comment: (NOTE) SARS-CoV-2 target nucleic acids are NOT DETECTED.  The SARS-CoV-2 RNA is generally detectable in upper respiratory specimens during the acute phase of infection. The lowest concentration of SARS-CoV-2 viral copies this assay can detect is 138 copies/mL. A negative result does not preclude SARS-Cov-2 infection and should not be used as the sole basis for treatment or other patient management decisions. A negative result may occur with  improper specimen collection/handling, submission of specimen other than nasopharyngeal swab, presence of viral mutation(s) within the areas targeted by this assay, and inadequate number of viral copies(<138 copies/mL). A negative result must be combined with clinical observations, patient history, and epidemiological information. The expected result is Negative.  Fact Sheet for Patients:  EntrepreneurPulse.com.au  Fact Sheet for Healthcare Providers:  IncredibleEmployment.be  This test is no t yet approved or cleared by the [place] FDA and  has been authorized for detection and/or diagnosis of SARS-CoV-2 by FDA under an Emergency Use  Authorization (EUA). This EUA will remain  in effect (meaning this test can be used) for the duration of the COVID-19 declaration under Section 564(b)(1) of the Act, 21 U.S.C.section 360bbb-3(b)(1), unless the authorization is terminated  or revoked sooner.       Influenza A by PCR NEGATIVE NEGATIVE Final   Influenza B by PCR NEGATIVE NEGATIVE Final    Comment: (NOTE) The Xpert Xpress SARS-CoV-2/FLU/RSV plus assay is intended as an aid in the diagnosis of influenza from Nasopharyngeal swab specimens and should not be used as a sole basis for treatment. Nasal washings and aspirates are unacceptable for Xpert Xpress SARS-CoV-2/FLU/RSV testing.  Fact Sheet for Patients: EntrepreneurPulse.com.au  Fact Sheet for Healthcare Providers: IncredibleEmployment.be  This test is not yet approved or cleared by the [place] FDA and has been authorized for detection and/or diagnosis of SARS-CoV-2 by FDA under an Emergency Use Authorization (EUA). This EUA will remain in effect (meaning this test can be used) for the duration of the COVID-19 declaration under Section 564(b)(1) of the Act, 21 U.S.C. section 360bbb-3(b)(1), unless the authorization is terminated or revoked.  Performed at [facility], [place] [address] [place], [place] [address]           Radiology Studies: No results found.      Scheduled Meds:  amLODipine  10 mg Oral Daily   apixaban  5 mg Oral BID   Chlorhexidine Gluconate Cloth  6 each Topical Daily   insulin aspart  0-9 Units Subcutaneous Q4H   levothyroxine  137 mcg Oral Q0600   lipase/protease/amylase  36,000 Units Oral TID AC   prochlorperazine  10 mg Oral BID   Continuous Infusions:  sodium chloride 75 mL/hr at 02/16/21 0900   potassium chloride 10 mEq (02/16/21 1005)     LOS: 4 days    Time spent:45 min  Georgette Shell, MD 02/16/2021, 11:03 AM

## 2021-02-16 NOTE — Consult Note (Signed)
[place]  [phone] Fax:([phone]   [place]    Referral MD  Reason for Referral: Admitted with acute pancreatitis, history of pancreatic cancer   Chief Complaint  Patient presents with   Abdominal Pain    HPI:   This is a very pleasant 28 male patient well-known to me in the outpatient setting with past medical history significant for metastatic pancreatic cancer on chemo regimen with Xeloda and gemcitabine admitted with chief complaint of abdominal pain, nausea and vomiting.  He was on gemcitabine with Abraxane however given worsening neuropathy, his Abraxane was discontinued and he was started on Xeloda.  He mentions that a day or 2 after starting Xeloda, he has noticed worsening abdominal pain.  He tried to take a small break from Xeloda but when he restarted it, the abdominal pain continued to get worse.  He was evaluated in the palliative clinic and was found to have severe abdominal tenderness and hence referred to emergency room for concern of acute abdomen.  He had CT which showed acute interstitial edematous pancreatitis.  There was concern if there is progression of pancreatic cancer however this is hard to discern in the setting of acute pancreatitis.  He is currently on IV fluids and pain control with IV Dilaudid and Vicodin.  His main complaint today is severe pain which he points to the left hypochondrium, lumbar quadrant and bilateral lower abdomen.  He is able to consume clear liquid diet if he can time the pain medication with his food intake. No nausea or vomiting reported today.  No fevers or chills. No dysuria, diarrhea, mouth sores reported. Rest of the pertinent 10 point ROS reviewed and negative.   Past Medical History:  Diagnosis Date   Arthritis    Atypical chest pain 04/2017   ACS ruled out 05/11/17   CAP (community acquired pneumonia) 05/07/2017   Chronic constipation    at least  partially d/t chronic opioids   Diabetes mellitus without complication ([place])    Poor control starting fall 2021-->GAD ab neg.  Insulin and C peptide levels wnl but on lower end of normal->Dr Kuma/endo 2022.   Exocrine pancreatic insufficiency 2022   History of thyroidectomy, total 2018   Hurlthe cell adenoma   Hx of adenomatous polyp of colon 07/02/2009   rpt colonoscopy 2026   Hyperlipidemia    Intol of multiple statins and zetia. Advanced lipid clinic 2022.   Hypertension    Lipoma    back   Myocardial infarction [facility])    MI at age 85   Pancreatic adenocarcinoma ([place]) 06/2020   Mets to liver (?and lungs?->stage 4), w/acute pancreatitis.   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 Cammy Copa   Right ureteral calculus 04/2018   Cystoscopy with right retrograde pyelogram and right ureteral stent placement after stone removal.   Seasonal allergies    Stroke ([place])    in 12I no complications    Tachyarrhythmia 1980   age 80 X 2 over 6 month period.  No recurrence since that time.   Thoracic aortic aneurysm    Incidentally discovered, 4.1 cm-->Dr. Cyndia Bent. Rpt CT 08/2018 and 08/2019 stable.   Vocal cord paralysis 07/2016   Left vocal cord paralysis noted after thyroid  surgery.  :   Past Surgical History:  Procedure Laterality Date   BIOPSY  06/18/2020   Procedure: BIOPSY;  Surgeon: Rush Landmark Telford Nab., MD;  Location: [facility] END[facility]COPY;  Service: Gastroenterology;;   [place] W/ POLYPECTOMY  07/02/2009; 01/24/20   2011 adenoma.  2021 adenomas x 3->recall 2026   CYSTOSCOPY WITH RETROGRADE PYELOGRAM, URETEROSCOPY AND STENT PLACEMENT Right 05/09/2018   Procedure: CYSTOSCOPY WITH RIGHT RETROGRADE RIGHT URETEROSCOPY STONE EXTRACTION RIGHT  STENT EXCHANGE;  Surgeon: Lucas Mallow, MD;  Location: [facility] ORS;  Service: Urology;  Laterality: Right;   CYSTOSCOPY/RETROGRADE/URETEROSCOPY/STONE EXTRACTION WITH BASKET Right 04/30/2018   Procedure: CYSTOSCOPY/RETROGRADE/RIGHT URETEROSCOPY/;  Surgeon: Lucas Mallow, MD;  Location: [facility] ORS;  Service: Urology;  Laterality: Right;   ESOPHAGOGASTRODUODENOSCOPY  07/02/2009   NORMAL   ESOPHAGOGASTRODUODENOSCOPY (EGD) WITH PROPOFOL N/A 06/18/2020   Procedure: ESOPHAGOGASTRODUODENOSCOPY (EGD) WITH PROPOFOL;  Surgeon: Rush Landmark Telford Nab., MD;  Location: [place];  Service: Gastroenterology;  Laterality: N/A;   EYE SURGERY     FINE NEEDLE ASPIRATION  06/18/2020   Procedure: FINE NEEDLE ASPIRATION (FNA) LINEAR;  Surgeon: Irving Copas., MD;  Location: [place];  Service: Gastroenterology;;   finger amputaion     with reattachement   INCISION AND DRAINAGE ABSCESS ANAL  11/23/2019   gen surg (In ED).   IR IMAGING GUIDED PORT INSERTION  06/22/2020   LASIK     THYROIDECTOMY N/A 08/03/2016   Procedure: Total THYROIDECTOMY;  Surgeon: [name], MD;  Location: [place];  Service: ENT;  Laterality: N/A;  Total Thyroidectomy    UPPER ESOPHAGEAL ENDOSCOPIC ULTRASOUND (EUS) N/A 06/18/2020   Procedure: UPPER ESOPHAGEAL ENDOSCOPIC ULTRASOUND (EUS);  Surgeon: Irving Copas., MD;  Location: [place];  Service: Gastroenterology;  Laterality: N/A;   UPPER GASTROINTESTINAL ENDOSCOPY     with EUS and bx of pancreatic mass. +appearance of candida in esoph.    :   Current Facility-Administered Medications  Medication Dose Route Frequency Provider Last Rate Last Admin   0.9 %  sodium chloride infusion   Intravenous Continuous Georgette Shell, MD 75 mL/hr at 02/16/21 1700 Infusion Verify at 02/16/21 1700   acetaminophen (TYLENOL) tablet 650 mg  650 mg Oral Q6H PRN Toy Baker, MD       Or   acetaminophen (TYLENOL) suppository 650 mg  650 mg Rectal Q6H PRN Doutova,  Anastassia, MD       amLODipine (NORVASC) tablet 10 mg  10 mg Oral Daily Georgette Shell, MD   10 mg at 02/16/21 0908   apixaban (ELIQUIS) tablet 5 mg  5 mg Oral BID Toy Baker, MD   5 mg at 02/16/21 0908   bisacodyl (DULCOLAX) EC tablet 10 mg  10 mg Oral Daily Georgette Shell, MD   10 mg at 02/16/21 1231   Chlorhexidine Gluconate Cloth 2 % PADS 6 each  6 each Topical Daily Toy Baker, MD   6 each at 02/16/21 0909   HYDROcodone-acetaminophen (NORCO/VICODIN) 5-325 MG per tablet 1-2 tablet  1-2 tablet Oral Q4H PRN Toy Baker, MD   2 tablet at 02/16/21 1715   HYDROmorphone (DILAUDID) injection 1-2 mg  1-2 mg Intravenous Q3H PRN Georgette Shell, MD   1 mg at 02/16/21 1541   insulin aspart (novoLOG) injection 0-9 Units  0-9 Units Subcutaneous Q4H Toy Baker, MD   1 Units at 02/16/21 1712   levothyroxine (SYNTHROID) tablet 137 mcg  137 mcg Oral Q0600 Toy Baker, MD  137 mcg at 02/16/21 8315   lipase/protease/amylase (CREON) capsule 36,000 Units  36,000 Units Oral TID Mathews Robinsons, MD   36,000 Units at 02/16/21 1712   LORazepam (ATIVAN) tablet 0.5 mg  0.5 mg Oral BID PRN Toy Baker, MD   0.5 mg at 02/15/21 1132   prochlorperazine (COMPAZINE) tablet 10 mg  10 mg Oral BID Georgette Shell, MD   10 mg at 02/16/21 1761   sodium chloride flush (NS) 0.9 % injection 10-40 mL  10-40 mL Intracatheter PRN Toy Baker, MD          Allergies  Allergen Reactions   Atenolol     Severe "slowing"of functioning Severe "slowing"of functioning   Hydroxyzine Other (See Comments)    "felt like I was coming out of my skin"   Tetracycline     Rash Because of a history of documented adverse serious drug reaction;Medi Alert bracelet  is recommended Rash Because of a history of documented adverse serious drug reaction;Medi Alert bracelet  is recommended   Atorvastatin     D/Ced by him due to Myalgias in Sept 2014 D/Ced by him  due to Myalgias in Sept 2014   Codeine     Hallucinations. "I see pink elephants"   Guaifenesin     Other reaction(s): Other (See Comments) unknown unknown   Mucinex [Guaifenesin Er] Other (See Comments)    Hallucinations "I see pink elephants"  :   Family History  Problem Relation Age of Onset   Diabetes Mother    Cancer Mother        lung   COPD Mother    Brain cancer Mother    Arthritis Mother    Hyperlipidemia Mother    Heart disease Mother    Hypertension Mother    Diabetes Father    Cancer Father        mesothelioma; skin   Mental illness Father    Arthritis Father    Hyperlipidemia Father    Heart disease Father    Hypertension Father    Heart disease Maternal Grandmother        MI @ 58   Heart disease Maternal Grandfather        MI @ 48   Diabetes Paternal Grandmother    Dementia Paternal Grandmother    Heart disease Paternal Grandfather        MI @ 91   Colon cancer Neg Hx    Esophageal cancer Neg Hx    Rectal cancer Neg Hx    Stomach cancer Neg Hx   :   Social History   Socioeconomic History   Marital status: Married    Spouse name: Not on file   Number of children: Not on file   Years of education: Not on file   Highest education level: Not on file  Occupational History   Not on file  Tobacco Use   Smoking status: Never   Smokeless tobacco: Current    Types: Snuff  Vaping Use   Vaping Use: Never used  Substance and Sexual Activity   Alcohol use: No   Drug use: No   Sexual activity: Not on file  Other Topics Concern   Not on file  Social History Narrative   Married, 2 daughters, 1 grandson that lives with him.   Educ: some college   Occup: retired from the [employer].  Retired at 50 but returned to work at some point, now with grandson living with him.   No T/A/Ds.  Social Determinants of Health   Financial Resource Strain: Not on file  Food Insecurity: Not on file  Transportation Needs: Not on file  Physical Activity: Not  on file  Stress: Not on file  Social Connections: Not on file  Intimate Partner Violence: Not on file  :  Pertinent items are noted in HPI.  Exam: Patient Vitals for the past 24 hrs:  BP Temp Temp src Pulse Resp SpO2  02/16/21 1416 (!) 169/93 99 F (37.2 C) Oral 80 16 96 %  02/16/21 0347 139/78 98.4 F (36.9 C) Oral 80 18 96 %  02/15/21 2011 (!) 152/90 98.5 F (36.9 C) Oral 82 18 98 %    Physical Exam Constitutional:      Appearance: He is ill-appearing.  HENT:     Mouth/Throat:     Comments: Dry oral mucosa Cardiovascular:     Rate and Rhythm: Normal rate and regular rhythm.  Pulmonary:     Effort: Pulmonary effort is normal.     Breath sounds: Normal breath sounds.  Abdominal:     General: Abdomen is flat. Bowel sounds are normal.     Tenderness: There is abdominal tenderness in the right lower quadrant, left upper quadrant and left lower quadrant.  Skin:    General: Skin is warm.  Neurological:     General: No focal deficit present.     Mental Status: He is alert.     Lab Results  Component Value Date   WBC 2.7 (L) 02/16/2021   HGB 10.1 (L) 02/16/2021   HCT 29.2 (L) 02/16/2021   PLT 82 (L) 02/16/2021   GLUCOSE 120 (H) 02/16/2021   CHOL 148 02/13/2021   TRIG 72 02/13/2021   HDL 32 (L) 02/13/2021   LDLDIRECT 116.0 10/30/2019   LDLCALC 102 (H) 02/13/2021   ALT 11 02/16/2021   AST 12 (L) 02/16/2021   NA 138 02/16/2021   K 3.2 (L) 02/16/2021   CL 108 02/16/2021   CREATININE 0.83 02/16/2021   BUN 6 (L) 02/16/2021   CO2 23 02/16/2021    CT ABDOMEN PELVIS W CONTRAST  Result Date: 02/12/2021 CLINICAL DATA:  Pancreatic cancer with acute abdominal pain. Mid abdominal pain since diagnosis now worsening since 02/08/2021. Also reportedly with nausea constipation. EXAM: CT ABDOMEN AND PELVIS WITH CONTRAST TECHNIQUE: Multidetector CT imaging of the abdomen and pelvis was performed using the standard protocol following bolus administration of intravenous contrast.  CONTRAST:  5mL OMNIPAQUE IOHEXOL 350 MG/ML SOLN COMPARISON:  November 02, 2020 also compared with January 18, 2021. FINDINGS: Lower chest: Port-A-Cath terminates in the distal superior vena cava. Lung bases are clear. No effusion. No consolidative changes. Hepatobiliary: Subtle peripheral foci of arterial phase enhancement without washout particularly about the gallbladder fossa no substantial change compared to previous imaging. Hypodense area along the gallbladder fossa (image 52/7) 15 mm stable since the December 5th evaluation. Hypodensity anterior to the porta hepatis stable since December 5th as well. 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 No pericholecystic stranding or intrahepatic biliary duct distension. Pancreas: Pancreatic head mass obstructing the main pancreatic duct is difficult to separate from numerous areas of low attenuation about the pancreatic parenchyma that have developed surrounding ductal dilation that was seen on more remote studies. 2.5 cm area is suspected in the head of the pancreas obstructing the duct. Nodularity in the transverse mesocolon. Areas of low attenuation in the  pancreatic tail have increased in size, for instance (image 40/7) 18 mm as compared to 14 mm. Other areas of low attenuation are minimally increased. Spleen: Normal. Adrenals/Urinary Tract: Insert normal renal contrast Stomach/Bowel: Abundant collateral venous structures about the stomach related to occluded splenic portal confluence. Engorgement of the gastro colic trunk and gastroepiploic veins. This is similar to prior studies. 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 Inflammation contacts the posterior wall of the gastric antrum. No sign of bowel obstruction. The appendix is normal. Vascular/Lymphatic: Aortic atherosclerosis. No sign of aneurysm. Smooth contour of the IVC. There is no  gastrohepatic or hepatoduodenal ligament lymphadenopathy. No retroperitoneal or mesenteric lymphadenopathy. No pelvic sidewall lymphadenopathy. Reproductive: Unremarkable. Other: No free intraperitoneal air. No overt peritoneal nodularity. Trace ascites. This is present only in the pelvis Musculoskeletal: No acute bone finding. No destructive bone process. Spinal degenerative changes. 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 Irregular dilated main pancreatic duct gives way to ovoid areas of low attenuation surrounded by inflammation in the midst of remaining parenchyma in the pancreatic tail. Findings are compatible with acute interstitial edematous pancreatitis likely with dilated side branches. It would be difficult to exclude concomitant worsening of pancreatic neoplasm in the current setting. What can be seen in the pancreatic head is very similar to previous imaging. Decreasing size of low-attenuation areas in the liver, areas along the gallbladder fossa and anterior to porta hepatis are compatible with focal fat deposition the other area seen inferior to these areas along hepatic subsegment VI based on appearance and evolution is also more likely to represent focal fat. Consider attention on follow-up or a differentiation would change management PET or MRI could be considered as discussed previously. These results will be called to the ordering clinician or representative by the Radiologist Assistant, and communication documented in the PACS or [employer]. Electronically Signed   By: Zetta Bills M.D.   On: 02/12/2021 15:19   CT CHEST ABDOMEN PELVIS W CONTRAST  Result Date: 01/19/2021 CLINICAL DATA:  66 year old male with history of pancreatic cancer. Evaluate for treatment response. EXAM: CT CHEST, ABDOMEN, AND PELVIS WITH CONTRAST TECHNIQUE: Multidetector CT  imaging of the chest, abdomen and pelvis was performed following the standard protocol during bolus administration of intravenous contrast. CONTRAST:  88mL OMNIPAQUE IOHEXOL 350 MG/ML SOLN COMPARISON:  CT the chest, abdomen and pelvis 11/02/2020. FINDINGS: CT CHEST FINDINGS Cardiovascular: Heart size is normal. There is no significant pericardial fluid, thickening or pericardial calcification. There is aortic atherosclerosis, as well as atherosclerosis of the great vessels of the mediastinum and the coronary arteries, including calcified atherosclerotic plaque in the left main, left anterior descending and right coronary arteries. Right internal jugular single-lumen porta cath with tip terminating in the right atrium. Mediastinum/Nodes: No pathologically enlarged mediastinal or hilar lymph nodes. Esophagus is unremarkable in appearance. No axillary lymphadenopathy. Lungs/Pleura: Previously noted ill-defined nodule of concern in the posterior aspect of the right upper lobe has completely resolved, indicative of a benign infectious or inflammatory etiology on the prior study. Previously noted 5 mm right middle lobe pulmonary nodule has also resolved. Two left lower lobe pulmonary nodules are again noted on axial image 75 of series 12, largest of which measures 6 mm, stable, but new when compared to more remote prior studies. No other new suspicious appearing pulmonary nodules or masses are noted. No acute consolidative airspace disease. No pleural effusions. Musculoskeletal: There  are no aggressive appearing lytic or blastic lesions noted in the visualized portions of the skeleton. CT ABDOMEN PELVIS FINDINGS Hepatobiliary: Previously noted new hypovascular lesion in segment 5 of the liver has decreased slightly in size compared to the prior study (axial image 163 of series 11), currently measuring 2.1 x 1.9 cm (previously 3.1 x 2.5 cm), suggesting a partial response to therapy for a metastatic lesion. Other  hypovascular areas adjacent to the gallbladder fossa seen on the prior study are also less pronounced on today's examination, also favored to reflect treated metastatic lesions. No definite new hepatic lesions are noted on today's examination. There continues to be a vague area of arterial phase hyperenhancement in segment 4B of the liver (axial image 33 of series 2), stable compared to prior examinations, likely a benign perfusion anomaly. No intra or extrahepatic biliary ductal dilatation. Gallbladder is normal in appearance. Pancreas: Again noted is a poorly defined hypovascular mass in the head of the pancreas (axial image 39 of series 2 and coronal image 39 of series 3), estimated to measure approximately 2.2 x 2.1 x 2.1 cm on today's study. This is intimately associated with the superior mesenteric vein, splenoportal confluence and proximal portal vein, all of which appear markedly narrowed as they traverse adjacent to the lesion. There is a well-defined fat plane between the lesion and the adjacent superior mesenteric artery. Common hepatic artery also appears separate from the lesion. This lesion continues to obstruct the main pancreatic duct which is markedly dilated throughout the body and tail of the pancreas, measuring up to 1.4 cm in diameter. New areas of low attenuation are noted throughout the distal body and tail of the pancreas, favored to represent either developing pancreatic pseudocysts or areas of increasing side branch ectasia, largest of which is noted in the region of the junction of distal body and tail (axial image 37 of series 2) measuring 1.7 x 1.1 cm. Soft tissue stranding is noted adjacent to the distal body and tail of the pancreas, suggesting probable acute pancreatitis. Spleen: Spleen is enlarged measuring 12.5 x 5.6 x 13.8 cm (estimated splenic volume of 483 mL) . Adrenals/Urinary Tract: Bilateral kidneys and adrenal glands are normal in appearance. No hydroureteronephrosis.  Urinary bladder is normal in appearance. Stomach/Bowel: Normal appearance of the stomach. No pathologic dilatation of small bowel or colon. Normal appendix. Vascular/Lymphatic: Aortic atherosclerosis, without evidence of aneurysm or dissection in the abdominal or pelvic vasculature. Vascular findings pertinent to pancreatic neoplasm, as detailed above. Multiple portosystemic collateral pathways are noted, most evident as gastric varices. No lymphadenopathy noted in the abdomen or pelvis. Reproductive: Prostate gland and seminal vesicles are unremarkable in appearance. Other: No significant volume of ascites.  No pneumoperitoneum. Musculoskeletal: There are no aggressive appearing lytic or blastic lesions noted in the visualized portions of the skeleton. IMPRESSION: 1. Today's study demonstrates positive response to therapy in terms of regression of numerous hypovascular hepatic lesions which likely represent hepatic metastases. 2. New pulmonary nodules seen on the prior study have regressed, favored to be indicative of benign infectious or inflammatory etiologies on the prior study. Old small pulmonary nodules are stable compared to the most recent prior examination, but new compared to more remote prior studies, likely to represent treated stable metastatic lesions. 3. The primary pancreatic mass continues to obstruct the main pancreatic duct, with worsening side branch ectasia throughout the body and tail of the pancreas and/or developing pancreatic pseudocysts, with evidence of pancreatitis in these regions, as above. 4. The primary  pancreatic mass continues to cause severe narrowing of the superior mesenteric vein, splenoportal confluence and proximal portal vein (which is nearly completely occluded). Associated with this are portosystemic collateral pathways, most notable for large gastric varices, and mild splenomegaly. 5. Aortic atherosclerosis, in addition to left main and 2 vessel coronary artery disease.  6. Additional incidental findings, as above. Electronically Signed   By: Vinnie Langton M.D.   On: 01/19/2021 09:24     CT ABDOMEN PELVIS W CONTRAST  Result Date: 02/12/2021 CLINICAL DATA:  Pancreatic cancer with acute abdominal pain. Mid abdominal pain since diagnosis now worsening since 02/08/2021. Also reportedly with nausea constipation. EXAM: CT ABDOMEN AND PELVIS WITH CONTRAST TECHNIQUE: Multidetector CT imaging of the abdomen and pelvis was performed using the standard protocol following bolus administration of intravenous contrast. CONTRAST:  69mL OMNIPAQUE IOHEXOL 350 MG/ML SOLN COMPARISON:  November 02, 2020 also compared with January 18, 2021. FINDINGS: Lower chest: Port-A-Cath terminates in the distal superior vena cava. Lung bases are clear. No effusion. No consolidative changes. Hepatobiliary: Subtle peripheral foci of arterial phase enhancement without washout particularly about the gallbladder fossa no substantial change compared to previous imaging. Hypodense area along the gallbladder fossa (image 52/7) 15 mm stable since the December 5th evaluation. Hypodensity anterior to the porta hepatis stable since December 5th as well. 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 No pericholecystic stranding or intrahepatic biliary duct distension. Pancreas: Pancreatic head mass obstructing the main pancreatic duct is difficult to separate from numerous areas of low attenuation about the pancreatic parenchyma that have developed surrounding ductal dilation that was seen on more remote studies. 2.5 cm area is suspected in the head of the pancreas obstructing the duct. Nodularity in the transverse mesocolon. Areas of low attenuation in the pancreatic tail have increased in size, for instance (image 40/7) 18 mm as compared to 14 mm. Other areas of low attenuation are minimally increased. Spleen: Normal.  Adrenals/Urinary Tract: Insert normal renal contrast Stomach/Bowel: Abundant collateral venous structures about the stomach related to occluded splenic portal confluence. Engorgement of the gastro colic trunk and gastroepiploic veins. This is similar to prior studies. 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 Inflammation contacts the posterior wall of the gastric antrum. No sign of bowel obstruction. The appendix is normal. Vascular/Lymphatic: Aortic atherosclerosis. No sign of aneurysm. Smooth contour of the IVC. There is no gastrohepatic or hepatoduodenal ligament lymphadenopathy. No retroperitoneal or mesenteric lymphadenopathy. No pelvic sidewall lymphadenopathy. Reproductive: Unremarkable. Other: No free intraperitoneal air. No overt peritoneal nodularity. Trace ascites. This is present only in the pelvis Musculoskeletal: No acute bone finding. No destructive bone process. Spinal degenerative changes. 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 Irregular dilated main pancreatic duct gives way to ovoid areas of low attenuation surrounded by inflammation in the midst of remaining parenchyma in the pancreatic tail. Findings are compatible with acute interstitial edematous pancreatitis likely with dilated side branches. It would be difficult to exclude concomitant worsening of pancreatic neoplasm in the current setting. What can be seen in the pancreatic head is very similar to previous imaging. Decreasing size of low-attenuation areas in the liver, areas along the gallbladder fossa and anterior to porta hepatis are compatible with focal fat deposition the other area seen inferior to these areas along hepatic subsegment VI based on  appearance and evolution is also more likely to represent focal fat. Consider attention on follow-up or a differentiation would  change management PET or MRI could be considered as discussed previously. These results will be called to the ordering clinician or representative by the Radiologist Assistant, and communication documented in the PACS or [employer]. Electronically Signed   By: Zetta Bills M.D.   On: 02/12/2021 15:19   CT CHEST ABDOMEN PELVIS W CONTRAST  Result Date: 01/19/2021 CLINICAL DATA:  66 year old male with history of pancreatic cancer. Evaluate for treatment response. EXAM: CT CHEST, ABDOMEN, AND PELVIS WITH CONTRAST TECHNIQUE: Multidetector CT imaging of the chest, abdomen and pelvis was performed following the standard protocol during bolus administration of intravenous contrast. CONTRAST:  62mL OMNIPAQUE IOHEXOL 350 MG/ML SOLN COMPARISON:  CT the chest, abdomen and pelvis 11/02/2020. FINDINGS: CT CHEST FINDINGS Cardiovascular: Heart size is normal. There is no significant pericardial fluid, thickening or pericardial calcification. There is aortic atherosclerosis, as well as atherosclerosis of the great vessels of the mediastinum and the coronary arteries, including calcified atherosclerotic plaque in the left main, left anterior descending and right coronary arteries. Right internal jugular single-lumen porta cath with tip terminating in the right atrium. Mediastinum/Nodes: No pathologically enlarged mediastinal or hilar lymph nodes. Esophagus is unremarkable in appearance. No axillary lymphadenopathy. Lungs/Pleura: Previously noted ill-defined nodule of concern in the posterior aspect of the right upper lobe has completely resolved, indicative of a benign infectious or inflammatory etiology on the prior study. Previously noted 5 mm right middle lobe pulmonary nodule has also resolved. Two left lower lobe pulmonary nodules are again noted on axial image 75 of series 12, largest of which measures 6 mm, stable, but new when compared to more remote prior studies. No other new suspicious appearing pulmonary  nodules or masses are noted. No acute consolidative airspace disease. No pleural effusions. Musculoskeletal: There are no aggressive appearing lytic or blastic lesions noted in the visualized portions of the skeleton. CT ABDOMEN PELVIS FINDINGS Hepatobiliary: Previously noted new hypovascular lesion in segment 5 of the liver has decreased slightly in size compared to the prior study (axial image 163 of series 11), currently measuring 2.1 x 1.9 cm (previously 3.1 x 2.5 cm), suggesting a partial response to therapy for a metastatic lesion. Other hypovascular areas adjacent to the gallbladder fossa seen on the prior study are also less pronounced on today's examination, also favored to reflect treated metastatic lesions. No definite new hepatic lesions are noted on today's examination. There continues to be a vague area of arterial phase hyperenhancement in segment 4B of the liver (axial image 33 of series 2), stable compared to prior examinations, likely a benign perfusion anomaly. No intra or extrahepatic biliary ductal dilatation. Gallbladder is normal in appearance. Pancreas: Again noted is a poorly defined hypovascular mass in the head of the pancreas (axial image 39 of series 2 and coronal image 39 of series 3), estimated to measure approximately 2.2 x 2.1 x 2.1 cm on today's study. This is intimately associated with the superior mesenteric vein, splenoportal confluence and proximal portal vein, all of which appear markedly narrowed as they traverse adjacent to the lesion. There is a well-defined fat plane between the lesion and the adjacent superior mesenteric artery. Common hepatic artery also appears separate from the lesion. This lesion continues to obstruct the main pancreatic duct which is markedly dilated throughout the body and tail of the pancreas, measuring up to 1.4 cm in diameter. New areas of low attenuation are  noted throughout the distal body and tail of the pancreas, favored to represent either  developing pancreatic pseudocysts or areas of increasing side branch ectasia, largest of which is noted in the region of the junction of distal body and tail (axial image 37 of series 2) measuring 1.7 x 1.1 cm. Soft tissue stranding is noted adjacent to the distal body and tail of the pancreas, suggesting probable acute pancreatitis. Spleen: Spleen is enlarged measuring 12.5 x 5.6 x 13.8 cm (estimated splenic volume of 483 mL) . Adrenals/Urinary Tract: Bilateral kidneys and adrenal glands are normal in appearance. No hydroureteronephrosis. Urinary bladder is normal in appearance. Stomach/Bowel: Normal appearance of the stomach. No pathologic dilatation of small bowel or colon. Normal appendix. Vascular/Lymphatic: Aortic atherosclerosis, without evidence of aneurysm or dissection in the abdominal or pelvic vasculature. Vascular findings pertinent to pancreatic neoplasm, as detailed above. Multiple portosystemic collateral pathways are noted, most evident as gastric varices. No lymphadenopathy noted in the abdomen or pelvis. Reproductive: Prostate gland and seminal vesicles are unremarkable in appearance. Other: No significant volume of ascites.  No pneumoperitoneum. Musculoskeletal: There are no aggressive appearing lytic or blastic lesions noted in the visualized portions of the skeleton. IMPRESSION: 1. Today's study demonstrates positive response to therapy in terms of regression of numerous hypovascular hepatic lesions which likely represent hepatic metastases. 2. New pulmonary nodules seen on the prior study have regressed, favored to be indicative of benign infectious or inflammatory etiologies on the prior study. Old small pulmonary nodules are stable compared to the most recent prior examination, but new compared to more remote prior studies, likely to represent treated stable metastatic lesions. 3. The primary pancreatic mass continues to obstruct the main pancreatic duct, with worsening side branch ectasia  throughout the body and tail of the pancreas and/or developing pancreatic pseudocysts, with evidence of pancreatitis in these regions, as above. 4. The primary pancreatic mass continues to cause severe narrowing of the superior mesenteric vein, splenoportal confluence and proximal portal vein (which is nearly completely occluded). Associated with this are portosystemic collateral pathways, most notable for large gastric varices, and mild splenomegaly. 5. Aortic atherosclerosis, in addition to left main and 2 vessel coronary artery disease. 6. Additional incidental findings, as above. Electronically Signed   By: Vinnie Langton M.D.   On: 01/19/2021 09:24    Assessment and Plan:   This is a very pleasant 66 year old male patient with metastatic pancreatic cancer admitted with acute pancreatitis.  He was on chemotherapy with Xeloda and Gemzar before admission.  He mentions that the abdominal pain has started to bother him right after he started taking Xeloda.  It is not quite clear of his acute pancreatitis is indeed related to the malignancy versus the medication.  There have been very few case reports of Xeloda induced pancreatitis.  At this time however since patient is quite reluctant to take any oral chemotherapy, I agree with holding this. He should continue supportive care, IV fluids and pain medication as recommended by the primary team.  Once here for once he recovers from his acute pancreatitis, we can consider transitioning him to 5-FU and Gemzar combination since he could not tolerate Xeloda. Single agent gemcitabine has been used in metastatic pancreatic cancer however progression free survival is just under 6 months. He is agreeable to the above-mentioned recommendations.  We will continue to monitor him periodically while he is in the hospital.  Pancytopenia likely related to chemotherapy, no indication for transfusion.  Please consider transfusion if hemoglobin is less  than 7 g/dL or if  platelet count is less than 20,000 or in the setting of spontaneous bleeding.  Please do not hesitate to contact our team with any further questions.  We appreciate your assistance in taking care of her patient.  Currently his pain is managed with Dilaudid 1 to 2 mg IV every 3 hours as needed as well as Norco 1 to 2 tablets oral every 4 hours as needed.  He tells me that Dilaudid works for about 2 to 2-1/2 hours.  I encouraged him to also use Vicodin for moderate pain.  He expressed understanding of the recommendations.  Constipation, Agree with Dulcolax daily.  Thank you for this referral.

## 2021-02-16 NOTE — Progress Notes (Signed)
Progress Note   Subjective  Chief Complaint: Acute pancreatitis, history of pancreatic cancer  Today, the patient tells me that he continues to do about the same.  He continues with some epigastric discomfort and tells me he tried to eat some clear liquid diet yesterday but had increased epigastric abdominal pain as well as gas and bloating.  Due to this has not tried much else.  He does ask questions in regards to his constipation.  Tells me he is chronically constipated his whole life, he is worried that now he is on pain medications that this will make it worse.  Last bowel movement reported Friday, 02/12/2021.  They do ask questions about his chemotherapy, apparently he is due for treatment tomorrow, they have already reached out to his oncologist.   Objective   Vital signs in last 24 hours: Temp:  [98.4 F (36.9 C)-98.8 F (37.1 C)] 98.4 F (36.9 C) (01/03 0347) Pulse Rate:  [80-86] 80 (01/03 0347) Resp:  [16-18] 18 (01/03 0347) BP: (139-170)/(78-99) 139/78 (01/03 0347) SpO2:  [96 %-98 %] 96 % (01/03 0347) Last BM Date: 02/15/21 General:    white male in NAD Heart:  Regular rate and rhythm; no murmurs Lungs: Respirations even and unlabored, lungs CTA bilaterally Abdomen:  Soft, marked epigastric TTP and nondistended. Normal bowel sounds. Extremities:  Without edema. Psych:  Cooperative. Normal mood and affect.  Intake/Output from previous day: 01/02 0701 - 01/03 0700 In: 1298 [P.O.:1298] Out: -  Intake/Output this shift: Total I/O In: 1698.2 [I.V.:1698.2] Out: -   Lab Results: Recent Labs    02/14/21 0502 02/15/21 0540 02/16/21 0424  WBC 3.0* 2.9* 2.7*  HGB 9.7* 9.9* 10.1*  HCT 29.2* 28.7* 29.2*  PLT 73* 75* 82*   BMET Recent Labs    02/14/21 0502 02/15/21 0540 02/16/21 0424  NA 137 136 138  K 3.8 3.5 3.2*  CL 106 110 108  CO2 20* 21* 23  GLUCOSE 116* 123* 120*  BUN 12 7* 6*  CREATININE 0.73 0.81 0.83  CALCIUM 8.7* 8.5* 8.8*   LFT Recent Labs     02/16/21 0424  PROT 6.2*  ALBUMIN 3.5  AST 12*  ALT 11  ALKPHOS 71  BILITOT 0.8   Lipase     Component Value Date/Time   LIPASE 20 02/16/2021 0424      Assessment / Plan:   Assessment: 1.  Acute pancreatitis with locally advanced pancreatic cancer in the head of the pancreas: Patient presented with epigastric pain as well as nausea and vomiting, pancreatitis appeared just started soon after beginning Xeloda which has since been stopped (also ACE inhibitor is on hold), continues with epigastric pain today worse with clear liquid diet 2.  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  History of DVT on Eliquis 4.  Chronic constipation: Patient typically uses magnesium citrate or other daily laxative at home, last bowel movement 02/12/2021; likely has lack of p.o. intake is contributing as well as pain meds  Plan: 1.  Discussed with patient that at this point I am not very concerned about his constipation given that he is having flatus and has not been eating, in the future may want to add in a laxative versus enema and/or order abdominal x-ray for further investigation 2.  Patient is not tolerating a clear liquid diet.  Due to this we will put his diet back on hold for today.  Did discuss with the  patient and his family member that his pancreatic cancer could definitely be contributing to current symptoms. 3.  Would recommend oncology's assistance in discussing chemotherapy going forward.  Thank you for your kind consultation, we will continue to follow.   LOS: 4 days   Levin Erp  02/16/2021, 10:32 AM

## 2021-02-17 ENCOUNTER — Inpatient Hospital Stay: Payer: 59

## 2021-02-17 ENCOUNTER — Inpatient Hospital Stay: Payer: 59 | Attending: Genetic Counselor | Admitting: Hematology and Oncology

## 2021-02-17 DIAGNOSIS — G893 Neoplasm related pain (acute) (chronic): Secondary | ICD-10-CM | POA: Insufficient documentation

## 2021-02-17 DIAGNOSIS — C787 Secondary malignant neoplasm of liver and intrahepatic bile duct: Secondary | ICD-10-CM | POA: Insufficient documentation

## 2021-02-17 DIAGNOSIS — G620 Drug-induced polyneuropathy: Secondary | ICD-10-CM | POA: Insufficient documentation

## 2021-02-17 DIAGNOSIS — C7800 Secondary malignant neoplasm of unspecified lung: Secondary | ICD-10-CM | POA: Insufficient documentation

## 2021-02-17 DIAGNOSIS — E119 Type 2 diabetes mellitus without complications: Secondary | ICD-10-CM | POA: Insufficient documentation

## 2021-02-17 DIAGNOSIS — E039 Hypothyroidism, unspecified: Secondary | ICD-10-CM | POA: Insufficient documentation

## 2021-02-17 DIAGNOSIS — K8500 Idiopathic acute pancreatitis without necrosis or infection: Secondary | ICD-10-CM | POA: Diagnosis not present

## 2021-02-17 DIAGNOSIS — C259 Malignant neoplasm of pancreas, unspecified: Secondary | ICD-10-CM | POA: Insufficient documentation

## 2021-02-17 DIAGNOSIS — C250 Malignant neoplasm of head of pancreas: Secondary | ICD-10-CM | POA: Diagnosis not present

## 2021-02-17 LAB — COMPREHENSIVE METABOLIC PANEL
ALT: 10 U/L (ref 0–44)
AST: 13 U/L — ABNORMAL LOW (ref 15–41)
Albumin: 3.4 g/dL — ABNORMAL LOW (ref 3.5–5.0)
Alkaline Phosphatase: 68 U/L (ref 38–126)
Anion gap: 10 (ref 5–15)
BUN: 6 mg/dL — ABNORMAL LOW (ref 8–23)
CO2: 21 mmol/L — ABNORMAL LOW (ref 22–32)
Calcium: 8.7 mg/dL — ABNORMAL LOW (ref 8.9–10.3)
Chloride: 107 mmol/L (ref 98–111)
Creatinine, Ser: 0.79 mg/dL (ref 0.61–1.24)
GFR, Estimated: >60 mL/min (ref >60)
Glucose, Bld: 121 mg/dL — ABNORMAL HIGH (ref 70–99)
Potassium: 3.6 mmol/L (ref 3.5–5.1)
Sodium: 138 mmol/L (ref 135–145)
Total Bilirubin: 0.9 mg/dL (ref 0.3–1.2)
Total Protein: 6.4 g/dL — ABNORMAL LOW (ref 6.5–8.1)

## 2021-02-17 LAB — GLUCOSE, CAPILLARY
Glucose-Capillary: 150 mg/dL — ABNORMAL HIGH (ref 70–99)
Glucose-Capillary: 129 mg/dL — ABNORMAL HIGH (ref 70–99)
Glucose-Capillary: 99 mg/dL (ref 70–99)
Glucose-Capillary: 148 mg/dL — ABNORMAL HIGH (ref 70–99)
Glucose-Capillary: 124 mg/dL — ABNORMAL HIGH (ref 70–99)
Glucose-Capillary: 139 mg/dL — ABNORMAL HIGH (ref 70–99)

## 2021-02-17 LAB — LIPASE, BLOOD: Lipase: 20 U/L (ref 11–51)

## 2021-02-17 LAB — CBC
HCT: 30 % — ABNORMAL LOW (ref 39.0–52.0)
Hemoglobin: 10.3 g/dL — ABNORMAL LOW (ref 13.0–17.0)
MCH: 32 pg (ref 26.0–34.0)
MCHC: 34.3 g/dL (ref 30.0–36.0)
MCV: 93.2 fL (ref 80.0–100.0)
Platelets: 112 10*3/uL — ABNORMAL LOW (ref 150–400)
RBC: 3.22 MIL/uL — ABNORMAL LOW (ref 4.22–5.81)
RDW: 13.1 % (ref 11.5–15.5)
WBC: 3.6 10*3/uL — ABNORMAL LOW (ref 4.0–10.5)
nRBC: 0 % (ref 0.0–0.2)

## 2021-02-17 NOTE — Plan of Care (Signed)
  Problem: Activity: Goal: Risk for activity intolerance will decrease Outcome: Progressing   Problem: Nutrition: Goal: Adequate nutrition will be maintained Outcome: Progressing   Problem: Coping: Goal: Level of anxiety will decrease Outcome: Progressing   

## 2021-02-17 NOTE — Progress Notes (Signed)
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  CT abdomen/pelvis showed acute edematous pancreatitis.    Subjective   Patient seen and examined, denies abdominal pain.  Tolerating full liquid diet well.   Assessment/Plan:    Acute pancreatitis -Patient has history of stage IV pancreatic cancer with mets to liver and lungs -CT findings consistent with acute interstitial edematous pancreatitis -He was undergoing chemotherapy prior to admission -Pancreatitis is thought to be due to Xeloda -Oncology was consulted, Xeloda is being held -Oncology considering to transition him to 5-FU and Gemzar combination instead of Xeloda -Lipase is down to 20 -Patient tolerating full liquid diet well -GI recommends to start low-fat diet in a.m. -GI has signed off  Diabetes mellitus type 2 -Continue sliding scale insulin with NovoLog -CBG well controlled  Hypertension -Blood pressure stable -Dose of Norvasc increased to 10 mg daily -ACE inhibitor discontinued due to concern for pancreatitis  Hyperlipidemia -LDL 102, he was on statins which she stopped due to myalgias  Hypothyroidism -Continue Synthroid  Pancytopenia -Likely from recent chemotherapy -Improving  History portal vein thrombus -Continue Eliquis   Medications     amLODipine  10 mg Oral Daily   apixaban  5 mg Oral BID   bisacodyl  10 mg Oral Daily   Chlorhexidine Gluconate Cloth  6 each Topical Daily   insulin aspart  0-9 Units Subcutaneous Q4H   levothyroxine  137 mcg Oral Q0600   lipase/protease/amylase  36,000 Units Oral TID AC   prochlorperazine  10 mg Oral BID      Data Reviewed:   CBG:  Recent Labs  Lab 02/16/21 2027 02/16/21 2312 02/17/21 0316 02/17/21 0737 02/17/21 1153  GLUCAP 155* 150* 129* 99 148*    SpO2: 98 %    Vitals:   02/16/21 1416 02/16/21 2025 02/17/21 0314 02/17/21 1452  BP: (!) 169/93 (!) 143/93 133/85 126/74  Pulse: 80 78 77 69  Resp: 16 20 20 14   Temp: 99 F (37.2 C) 98.7 F (37.1 C) 98.6 F (37 C) 99 F (37.2 C)  TempSrc: Oral Oral Oral Oral  SpO2: 96% 96% 97% 98%  Weight:      Height:         Intake/Output Summary (Last 24 hours) at 02/17/2021 1724 Last data filed at 02/17/2021 1414 Gross per 24 hour  Intake 1180 ml  Output --  Net 1180 ml    01/02 1901 - 01/04 0700 In: 2622.3 [P.O.:236; I.V.:1986.3] Out: -   Filed Weights   02/12/21 1633  Weight: 81.5 kg    Data Reviewed: Basic Metabolic Panel: Recent Labs  Lab 02/12/21 2000 02/13/21 0500 02/13/21 0844 02/13/21 1440 02/14/21 0502 02/15/21 0540 02/16/21 0424 02/17/21 0419  NA  --  141 135  --  137 136 138 138  K  --  2.1* 4.1  --  3.8 3.5 3.2* 3.6  CL  --  125* 105  --  106 110 108 107  CO2  --  13* 21*  --  20* 21* 23 21*  GLUCOSE  --  82 149*  --  116* 123* 120* 121*  BUN  --  8 14  --  12 7* 6*  6*  CREATININE  --  0.45* 0.89  --  0.73 0.81 0.83 0.79  CALCIUM  --  4.5* 8.7*  --  8.7* 8.5* 8.8* 8.7*  MG 2.0 1.1*  --  1.9  --   --  1.8  --   PHOS 4.1 1.9*  --  3.7  --   --   --   --    Liver Function Tests: Recent Labs  Lab 02/13/21 0844 02/14/21 0502 02/15/21 0540 02/16/21 0424 02/17/21 0419  AST 12* 12* 13* 12* 13*  ALT 11 10 11 11 10   ALKPHOS 76 73 71 71 68  BILITOT 0.9 0.8 1.0 0.8 0.9  PROT 6.8 6.5 6.3* 6.2* 6.4*  ALBUMIN 3.8 3.5 3.4* 3.5 3.4*   Recent Labs  Lab 02/12/21 1733 02/14/21 0502 02/15/21 0540 02/16/21 0424 02/17/21 0419  LIPASE 21 23 540* 20 20   No results for input(s): AMMONIA in the last 168 hours. CBC: Recent Labs  Lab 02/12/21 1733 02/13/21 0500 02/14/21 0502 02/15/21 0540 02/16/21 0424  02/17/21 0419  WBC 2.7* 3.0* 3.0* 2.9* 2.7* 3.6*  NEUTROABS 1.4* 1.6*  --   --   --   --   HGB 10.5* 10.4* 9.7* 9.9* 10.1* 10.3*  HCT 31.1* 31.2* 29.2* 28.7* 29.2* 30.0*  MCV 92.8 94.0 94.2 92.9 93.3 93.2  PLT 93* 88* 73* 75* 82* 112*   Cardiac Enzymes: Recent Labs  Lab 02/12/21 2000  CKTOTAL 53   BNP (last 3 results) No results for input(s): BNP in the last 8760 hours.  ProBNP (last 3 results) No results for input(s): PROBNP in the last 8760 hours.  CBG: Recent Labs  Lab 02/16/21 2027 02/16/21 2312 02/17/21 0316 02/17/21 0737 02/17/21 1153  GLUCAP 155* 150* 129* 99 148*       Radiology Reports  No results found.     Antibiotics: Anti-infectives (From admission, onward)    None         DVT prophylaxis: Apixaban  Code Status: DNR  Family Communication: No family at bedside   Consultants: Oncology Gastroenterology  Procedures:     Objective    Physical Examination:   General-appears in no acute distress Heart-S1-S2, regular, no murmur auscultated Lungs-clear to auscultation bilaterally, no wheezing or crackles auscultated Abdomen-soft, nontender, no organomegaly Extremities-no edema in the lower extremities Neuro-alert, oriented x3, no focal deficit noted  Status is: Inpatient  Dispo: The patient is from: Home              Anticipated d/c is to: Home              Anticipated d/c date is: 02/19/2021              Patient currently not stable for discharge  Barrier to discharge-ongoing management for pain control for pancreatitis  COVID-19 Labs  No results for input(s): DDIMER, FERRITIN, LDH, CRP in the last 72 hours.  Lab Results  Component Value Date   [place] NEGATIVE 02/12/2021   [place] NEGATIVE 07/13/2020   [place] NEGATIVE 06/15/2020   SARSCOV2NAA RESULT: NEGATIVE 01/22/2020            Recent Results (from the past 240 hour(s))  Resp Panel by RT-PCR (Flu A&B, Covid) Nasopharyngeal Swab     Status:  None   Collection Time: 02/12/21  5:49 PM   Specimen: Nasopharyngeal Swab; Nasopharyngeal(NP) swabs in vial transport medium  Result Value Ref Range Status   SARS Coronavirus 2 by RT PCR NEGATIVE NEGATIVE Final  Comment: (NOTE) SARS-CoV-2 target nucleic acids are NOT DETECTED.  The SARS-CoV-2 RNA is generally detectable in upper respiratory specimens during the acute phase of infection. The lowest concentration of SARS-CoV-2 viral copies this assay can detect is 138 copies/mL. A negative result does not preclude SARS-Cov-2 infection and should not be used as the sole basis for treatment or other patient management decisions. A negative result may occur with  improper specimen collection/handling, submission of specimen other than nasopharyngeal swab, presence of viral mutation(s) within the areas targeted by this assay, and inadequate number of viral copies(<138 copies/mL). A negative result must be combined with clinical observations, patient history, and epidemiological information. The expected result is Negative.  Fact Sheet for Patients:  EntrepreneurPulse.com.au  Fact Sheet for Healthcare Providers:  IncredibleEmployment.be  This test is no t yet approved or cleared by the [place] FDA and  has been authorized for detection and/or diagnosis of SARS-CoV-2 by FDA under an Emergency Use Authorization (EUA). This EUA will remain  in effect (meaning this test can be used) for the duration of the COVID-19 declaration under Section 564(b)(1) of the Act, 21 U.S.C.section 360bbb-3(b)(1), unless the authorization is terminated  or revoked sooner.       Influenza A by PCR NEGATIVE NEGATIVE Final   Influenza B by PCR NEGATIVE NEGATIVE Final    Comment: (NOTE) The Xpert Xpress SARS-CoV-2/FLU/RSV plus assay is intended as an aid in the diagnosis of influenza from Nasopharyngeal swab specimens and should not be used as a sole basis for  treatment. Nasal washings and aspirates are unacceptable for Xpert Xpress SARS-CoV-2/FLU/RSV testing.  Fact Sheet for Patients: EntrepreneurPulse.com.au  Fact Sheet for Healthcare Providers: IncredibleEmployment.be  This test is not yet approved or cleared by the [place] FDA and has been authorized for detection and/or diagnosis of SARS-CoV-2 by FDA under an Emergency Use Authorization (EUA). This EUA will remain in effect (meaning this test can be used) for the duration of the COVID-19 declaration under Section 564(b)(1) of the Act, 21 U.S.C. section 360bbb-3(b)(1), unless the authorization is terminated or revoked.  Performed at [facility], [place] [address] [place], [place] [address]     Oswald Hillock   [facility] If 7PM-7AM, please contact night-coverage at [email or web address], Office  [phone]   02/17/2021, 5:24 PM  LOS: 5 days

## 2021-02-17 NOTE — Progress Notes (Signed)
° °   Progress Note   Subjective  Chief Complaint: Acute pancreatitis in the setting of pancreatic cancer  Patient actually reports some improvement today.  He was able to tolerate his clear liquid diet yesterday and this morning with the adjustment of his pain medications.  He tells me the Dulcolax that he took yesterday did work and he had a bowel movement.  In general he is feeling slightly better today.   Objective   Vital signs in last 24 hours: Temp:  [98.6 F (37 C)-99 F (37.2 C)] 98.6 F (37 C) (01/04 0314) Pulse Rate:  [77-80] 77 (01/04 0314) Resp:  [16-20] 20 (01/04 0314) BP: (133-169)/(85-93) 133/85 (01/04 0314) SpO2:  [96 %-97 %] 97 % (01/04 0314) Last BM Date: 02/15/21 General:    white male in NAD Heart:  Regular rate and rhythm; no murmurs Lungs: Respirations even and unlabored, lungs CTA bilaterally Abdomen:  Soft, generalized moderate to marked TTP worse in the epigastrium and nondistended. Normal bowel sounds. Psych:  Cooperative. Normal mood and affect.  Intake/Output from previous day: 01/03 0701 - 01/04 0700 In: 2386.3 [I.V.:1986.3; IV Piggyback:400] Out: -   Lab Results: Recent Labs    02/15/21 0540 02/16/21 0424 02/17/21 0419  WBC 2.9* 2.7* 3.6*  HGB 9.9* 10.1* 10.3*  HCT 28.7* 29.2* 30.0*  PLT 75* 82* 112*   BMET Recent Labs    02/15/21 0540 02/16/21 0424 02/17/21 0419  NA 136 138 138  K 3.5 3.2* 3.6  CL 110 108 107  CO2 21* 23 21*  GLUCOSE 123* 120* 121*  BUN 7* 6* 6*  CREATININE 0.81 0.83 0.79  CALCIUM 8.5* 8.8* 8.7*   LFT Recent Labs    02/17/21 0419  PROT 6.4*  ALBUMIN 3.4*  AST 13*  ALT 10  ALKPHOS 68  BILITOT 0.9    Assessment / Plan:   Assessment: 1.  Acute pancreatitis with locally advanced pancreatic cancer in the head of the pancreas: Patient presented with epigastric pain as well as nausea/vomiting, pancreatitis started soon after beginning Xeloda which has since been stopped (question if this is related), today  doing better and tolerating clear liquids with knee pain management regimen 2.  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  History of DVT on Eliquis 4.  Chronic constipation: Better with Dulcolax  Plan: 1.  Continue Dulcolax 2.  Continue clear liquid diet 3.  Continue current pain management regimen  We may sign off today as there is not much else for [place] to add.  Would continue on clear liquid diet and advance as tolerated.    LOS: 5 days   Timothy Page  02/17/2021, 10:33 AM

## 2021-02-17 NOTE — Clinical Social Work Note (Signed)
°  Transition of Care [facility]) Screening Note   Patient Details  Name: AHAD COLARUSSO Date of Birth: Jul 05, 1955   Transition of Care [facility]) CM/SW Contact:    Trish Mage, LCSW Phone Number: 02/17/2021, 11:04 AM    Transition of Care Department [facility]) has reviewed patient and no TOC needs have been identified at this time. We will continue to monitor patient advancement through interdisciplinary progression rounds. If new patient transition needs arise, please place a TOC consult.     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  He has on-going oncology appointments scheduled.  Lives at home here in [place] with support of wife.  Identifies no needs.

## 2021-02-18 ENCOUNTER — Other Ambulatory Visit: Payer: Self-pay | Admitting: Hematology

## 2021-02-18 LAB — GLUCOSE, CAPILLARY
Glucose-Capillary: 114 mg/dL — ABNORMAL HIGH (ref 70–99)
Glucose-Capillary: 124 mg/dL — ABNORMAL HIGH (ref 70–99)
Glucose-Capillary: 124 mg/dL — ABNORMAL HIGH (ref 70–99)
Glucose-Capillary: 106 mg/dL — ABNORMAL HIGH (ref 70–99)
Glucose-Capillary: 152 mg/dL — ABNORMAL HIGH (ref 70–99)

## 2021-02-18 NOTE — Plan of Care (Signed)
Pt Timothy Page, pleasant and cooperative with staff. SBA/BRP. Diet advanced, pain continues, no nausea or vomiting.  Pain medication regimen per orders.   Problem: Education: Goal: Knowledge of General Education information will improve Description: Including pain rating scale, medication(s)/side effects and non-pharmacologic comfort measures Outcome: Progressing   Problem: Health Behavior/Discharge Planning: Goal: Ability to manage health-related needs will improve Outcome: Progressing   Problem: Clinical Measurements: Goal: Ability to maintain clinical measurements within normal limits will improve Outcome: Progressing Goal: Will remain free from infection Outcome: Progressing Goal: Diagnostic test results will improve Outcome: Progressing Goal: Respiratory complications will improve Outcome: Progressing Goal: Cardiovascular complication will be avoided Outcome: Progressing   Problem: Activity: Goal: Risk for activity intolerance will decrease Outcome: Progressing   Problem: Nutrition: Goal: Adequate nutrition will be maintained Outcome: Progressing   Problem: Coping: Goal: Level of anxiety will decrease Outcome: Progressing   Problem: Elimination: Goal: Will not experience complications related to bowel motility Outcome: Progressing Goal: Will not experience complications related to urinary retention Outcome: Progressing   Problem: Pain Managment: Goal: General experience of comfort will improve Outcome: Progressing   Problem: Safety: Goal: Ability to remain free from injury will improve Outcome: Progressing   Problem: Skin Integrity: Goal: Risk for impaired skin integrity will decrease Outcome: Progressing

## 2021-02-18 NOTE — Progress Notes (Signed)
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  CT abdomen/pelvis showed acute edematous pancreatitis.    Subjective   Patient still has abdominal pain.  Tolerating liquid diet well.  Wants diet advanced.  GI recommended to advance to low-fat diet.   Assessment/Plan:    Acute pancreatitis -Patient has history of stage IV pancreatic cancer with mets to liver and lungs -CT findings consistent with acute interstitial edematous pancreatitis -He was undergoing chemotherapy prior to admission -Pancreatitis is thought to be due to Xeloda -Oncology was consulted, Xeloda is being held -Oncology considering to transition him to 5-FU and Gemzar combination instead of Xeloda -Lipase is down to 20 -Patient tolerating full liquid diet well -We will advance to low-fat diet as per GI recommendation -Continue Dilaudid 1 to 2 mg every 3 hours as needed's -GI has signed off  Diabetes mellitus type 2 -Continue sliding scale insulin with NovoLog -CBG well controlled  Hypertension -Blood pressure stable -Dose of Norvasc increased to 10 mg daily -ACE inhibitor discontinued due to concern for pancreatitis  Hyperlipidemia -LDL 102, he was on statins which she stopped due to myalgias  Hypothyroidism -Continue Synthroid  Pancytopenia -Likely from recent chemotherapy -Improving  History portal vein thrombus -Continue Eliquis   Medications     amLODipine  10 mg Oral Daily   apixaban  5 mg Oral BID   Chlorhexidine Gluconate Cloth  6 each Topical Daily   insulin aspart  0-9 Units Subcutaneous Q4H   levothyroxine  137 mcg Oral Q0600    lipase/protease/amylase  36,000 Units Oral TID AC   prochlorperazine  10 mg Oral BID     Data Reviewed:   CBG:  Recent Labs  Lab 02/17/21 1727 02/17/21 2113 02/18/21 0426 02/18/21 0801 02/18/21 1200  GLUCAP 124* 139* 114* 124* 124*    SpO2: 95 %    Vitals:   02/17/21 1452 02/17/21 2113 02/18/21 0429 02/18/21 1202  BP: 126/74 115/78 126/77 110/74  Pulse: 69 76 67 81  Resp: 14 16 17 18   Temp: 99 F (37.2 C) 98.2 F (36.8 C) 98.3 F (36.8 C) 98.4 F (36.9 C)  TempSrc: Oral   Oral  SpO2: 98% 97% 96% 95%  Weight:      Height:         Intake/Output Summary (Last 24 hours) at 02/18/2021 1550 Last data filed at 02/18/2021 0737 Gross per 24 hour  Intake 3464.17 ml  Output --  Net 3464.17 ml    01/03 1901 - 01/05 0700 In: 4522.9 [P.O.:1770; I.V.:2752.9] Out: -   Filed Weights   02/12/21 1633  Weight: 81.5 kg    Data Reviewed: Basic Metabolic Panel: Recent Labs  Lab 02/12/21 2000 02/13/21 0500 02/13/21 0844 02/13/21 1440 02/14/21 0502 02/15/21 0540 02/16/21 0424 02/17/21 0419  NA  --  141 135  --  137 136 138 138  K  --  2.1* 4.1  --  3.8 3.5 3.2* 3.6  CL  --  125* 105  --  106 110 108 107  CO2  --  13* 21*  --  20* 21* 23 21*  GLUCOSE  --  82 149*  --  116* 123* 120* 121*  BUN  --  8 14  --  12 7* 6* 6*  CREATININE  --  0.45* 0.89  --  0.73 0.81 0.83 0.79  CALCIUM  --  4.5* 8.7*  --  8.7* 8.5* 8.8* 8.7*  MG 2.0 1.1*  --  1.9  --   --  1.8  --   PHOS 4.1 1.9*  --  3.7  --   --   --   --    Liver Function Tests: Recent Labs  Lab 02/13/21 0844 02/14/21 0502 02/15/21 0540 02/16/21 0424 02/17/21 0419  AST 12* 12* 13* 12* 13*  ALT 11 10 11 11 10   ALKPHOS 76 73 71 71 68  BILITOT 0.9 0.8 1.0 0.8 0.9  PROT 6.8 6.5 6.3* 6.2* 6.4*  ALBUMIN 3.8 3.5 3.4* 3.5 3.4*   Recent Labs  Lab 02/12/21 1733 02/14/21 0502 02/15/21 0540 02/16/21 0424 02/17/21 0419  LIPASE 21 23 540* 20 20   No results for input(s): AMMONIA in the last 168  hours. CBC: Recent Labs  Lab 02/12/21 1733 02/13/21 0500 02/14/21 0502 02/15/21 0540 02/16/21 0424 02/17/21 0419  WBC 2.7* 3.0* 3.0* 2.9* 2.7* 3.6*  NEUTROABS 1.4* 1.6*  --   --   --   --   HGB 10.5* 10.4* 9.7* 9.9* 10.1* 10.3*  HCT 31.1* 31.2* 29.2* 28.7* 29.2* 30.0*  MCV 92.8 94.0 94.2 92.9 93.3 93.2  PLT 93* 88* 73* 75* 82* 112*   Cardiac Enzymes: Recent Labs  Lab 02/12/21 2000  CKTOTAL 53   BNP (last 3 results) No results for input(s): BNP in the last 8760 hours.  ProBNP (last 3 results) No results for input(s): PROBNP in the last 8760 hours.  CBG: Recent Labs  Lab 02/17/21 1727 02/17/21 2113 02/18/21 0426 02/18/21 0801 02/18/21 1200  GLUCAP 124* 139* 114* 124* 124*       Radiology Reports  No results found.     Antibiotics: Anti-infectives (From admission, onward)    None         DVT prophylaxis: Apixaban  Code Status: DNR  Family Communication: No family at bedside   Consultants: Oncology Gastroenterology  Procedures:     Objective    Physical Examination:   General-appears in no acute distress Heart-S1-S2, regular, no murmur auscultated Lungs-clear to auscultation bilaterally, no wheezing or crackles auscultated Abdomen-soft, mild tenderness in epigastric region and left upper quadrant , no organomegaly Extremities-no edema in the lower extremities Neuro-alert, oriented x3, no focal deficit noted  Status is: Inpatient  Dispo: The patient is from: Home              Anticipated d/c is to: Home              Anticipated d/c date is: 02/19/2021              Patient currently not stable for discharge  Barrier to discharge-ongoing management for pain control for pancreatitis  COVID-19 Labs  No results for input(s): DDIMER, FERRITIN, LDH, CRP in the last 72 hours.  Lab Results  Component Value Date   [place] NEGATIVE 02/12/2021   [place] NEGATIVE 07/13/2020   [place] NEGATIVE 06/15/2020   SARSCOV2NAA  RESULT: NEGATIVE 01/22/2020            Recent Results (from the past 240 hour(s))  Resp Panel by RT-PCR (Flu A&B, Covid) Nasopharyngeal Swab     Status: None   Collection Time: 02/12/21  5:49 PM   Specimen: Nasopharyngeal Swab; Nasopharyngeal(NP) swabs in vial  transport medium  Result Value Ref Range Status   SARS Coronavirus 2 by RT PCR NEGATIVE NEGATIVE Final    Comment: (NOTE) SARS-CoV-2 target nucleic acids are NOT DETECTED.  The SARS-CoV-2 RNA is generally detectable in upper respiratory specimens during the acute phase of infection. The lowest concentration of SARS-CoV-2 viral copies this assay can detect is 138 copies/mL. A negative result does not preclude SARS-Cov-2 infection and should not be used as the sole basis for treatment or other patient management decisions. A negative result may occur with  improper specimen collection/handling, submission of specimen other than nasopharyngeal swab, presence of viral mutation(s) within the areas targeted by this assay, and inadequate number of viral copies(<138 copies/mL). A negative result must be combined with clinical observations, patient history, and epidemiological information. The expected result is Negative.  Fact Sheet for Patients:  EntrepreneurPulse.com.au  Fact Sheet for Healthcare Providers:  IncredibleEmployment.be  This test is no t yet approved or cleared by the [place] FDA and  has been authorized for detection and/or diagnosis of SARS-CoV-2 by FDA under an Emergency Use Authorization (EUA). This EUA will remain  in effect (meaning this test can be used) for the duration of the COVID-19 declaration under Section 564(b)(1) of the Act, 21 U.S.C.section 360bbb-3(b)(1), unless the authorization is terminated  or revoked sooner.       Influenza A by PCR NEGATIVE NEGATIVE Final   Influenza B by PCR NEGATIVE NEGATIVE Final    Comment: (NOTE) The Xpert Xpress  SARS-CoV-2/FLU/RSV plus assay is intended as an aid in the diagnosis of influenza from Nasopharyngeal swab specimens and should not be used as a sole basis for treatment. Nasal washings and aspirates are unacceptable for Xpert Xpress SARS-CoV-2/FLU/RSV testing.  Fact Sheet for Patients: EntrepreneurPulse.com.au  Fact Sheet for Healthcare Providers: IncredibleEmployment.be  This test is not yet approved or cleared by the [place] FDA and has been authorized for detection and/or diagnosis of SARS-CoV-2 by FDA under an Emergency Use Authorization (EUA). This EUA will remain in effect (meaning this test can be used) for the duration of the COVID-19 declaration under Section 564(b)(1) of the Act, 21 U.S.C. section 360bbb-3(b)(1), unless the authorization is terminated or revoked.  Performed at [facility], [place] [address], [place], [place] [address]     Oswald Hillock   [facility] If 7PM-7AM, please contact night-coverage at [email or web address], Office  [phone]   02/18/2021, 3:50 PM  LOS: 6 days

## 2021-02-19 ENCOUNTER — Encounter: Payer: 59 | Admitting: Endocrinology

## 2021-02-19 LAB — GLUCOSE, CAPILLARY
Glucose-Capillary: 120 mg/dL — ABNORMAL HIGH (ref 70–99)
Glucose-Capillary: 122 mg/dL — ABNORMAL HIGH (ref 70–99)
Glucose-Capillary: 123 mg/dL — ABNORMAL HIGH (ref 70–99)
Glucose-Capillary: 130 mg/dL — ABNORMAL HIGH (ref 70–99)
Glucose-Capillary: 130 mg/dL — ABNORMAL HIGH (ref 70–99)
Glucose-Capillary: 155 mg/dL — ABNORMAL HIGH (ref 70–99)
Glucose-Capillary: 164 mg/dL — ABNORMAL HIGH (ref 70–99)

## 2021-02-19 MED ORDER — MORPHINE SULFATE ER 15 MG PO TBCR
15.0000 mg | EXTENDED_RELEASE_TABLET | Freq: Two times a day (BID) | ORAL | Status: DC
  Administered 2021-02-19 – 2021-02-20 (×3): 15 mg via ORAL
  Filled 2021-02-19 (×3): qty 1

## 2021-02-19 MED ORDER — BISACODYL 10 MG RE SUPP: 10.0000 mg | Freq: Once | RECTAL | Status: DC

## 2021-02-19 MED ORDER — BISACODYL 5 MG PO TBEC
10.0000 mg | DELAYED_RELEASE_TABLET | Freq: Once | ORAL | Status: AC
  Administered 2021-02-19: 10 mg via ORAL
  Filled 2021-02-19: qty 2

## 2021-02-19 MED ORDER — SENNOSIDES-DOCUSATE SODIUM 8.6-50 MG PO TABS
2.0000 | ORAL_TABLET | Freq: Every day | ORAL | Status: DC
  Administered 2021-02-19: 2 via ORAL
  Filled 2021-02-19: qty 2

## 2021-02-19 MED ORDER — HEPARIN SOD (PORK) LOCK FLUSH 100 UNIT/ML IV SOLN
500.0000 [IU] | INTRAVENOUS | Status: AC | PRN
  Administered 2021-02-19: 500 [IU]

## 2021-02-19 MED ORDER — HYDROMORPHONE HCL 2 MG PO TABS
2.0000 mg | ORAL_TABLET | ORAL | Status: DC | PRN
  Administered 2021-02-19 – 2021-02-23 (×20): 2 mg via ORAL
  Filled 2021-02-19 (×21): qty 1

## 2021-02-19 NOTE — Plan of Care (Signed)
Pt aox4, pleasant and cooperative with staff. SBA/BRP. Diet advanced, pain continues, no nausea or vomiting.  Pain medication regimen per orders. RN encouraging use of PO pain medications, but pt still insistent on IV options.  Spouse at bedside.   Problem: Education: Goal: Knowledge of General Education information will improve Description: Including pain rating scale, medication(s)/side effects and non-pharmacologic comfort measures Outcome: Progressing   Problem: Health Behavior/Discharge Planning: Goal: Ability to manage health-related needs will improve Outcome: Progressing   Problem: Clinical Measurements: Goal: Ability to maintain clinical measurements within normal limits will improve Outcome: Progressing Goal: Will remain free from infection Outcome: Progressing Goal: Diagnostic test results will improve Outcome: Progressing Goal: Respiratory complications will improve Outcome: Progressing Goal: Cardiovascular complication will be avoided Outcome: Progressing   Problem: Activity: Goal: Risk for activity intolerance will decrease Outcome: Progressing   Problem: Nutrition: Goal: Adequate nutrition will be maintained Outcome: Progressing   Problem: Coping: Goal: Level of anxiety will decrease Outcome: Progressing   Problem: Elimination: Goal: Will not experience complications related to bowel motility Outcome: Progressing Goal: Will not experience complications related to urinary retention Outcome: Progressing   Problem: Pain Managment: Goal: General experience of comfort will improve Outcome: Progressing   Problem: Safety: Goal: Ability to remain free from injury will improve Outcome: Progressing   Problem: Skin Integrity: Goal: Risk for impaired skin integrity will decrease Outcome: Progressing   Problem: Education: Goal: Knowledge of General Education information will improve Description: Including pain rating scale, medication(s)/side effects and  non-pharmacologic comfort measures Outcome: Progressing   Problem: Health Behavior/Discharge Planning: Goal: Ability to manage health-related needs will improve Outcome: Progressing   Problem: Clinical Measurements: Goal: Ability to maintain clinical measurements within normal limits will improve Outcome: Progressing Goal: Will remain free from infection Outcome: Progressing Goal: Diagnostic test results will improve Outcome: Progressing Goal: Respiratory complications will improve Outcome: Progressing Goal: Cardiovascular complication will be avoided Outcome: Progressing   Problem: Activity: Goal: Risk for activity intolerance will decrease Outcome: Progressing   Problem: Coping: Goal: Level of anxiety will decrease Outcome: Progressing   Problem: Pain Managment: Goal: General experience of comfort will improve Outcome: Progressing   Problem: Safety: Goal: Ability to remain free from injury will improve Outcome: Progressing

## 2021-02-19 NOTE — Progress Notes (Signed)
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  CT abdomen/pelvis showed acute edematous pancreatitis.    Subjective   Patient still complains of abdominal pain, diet was advanced to low-fat diet yesterday.  Denies nausea or vomiting.  He is anxious to go home without Dilaudid.  Patient takes MS Contin at home.   Assessment/Plan:    Acute pancreatitis -Patient has history of stage IV pancreatic cancer with mets to liver and lungs -CT findings consistent with acute interstitial edematous pancreatitis -He was undergoing chemotherapy prior to admission -Pancreatitis is thought to be due to Xeloda -Oncology was consulted, Xeloda is being held -Oncology considering to transition him to 5-FU and Gemzar combination instead of Xeloda -Lipase is down to 20 -Diet was advanced to low-fat diet as per GI recommendation -Currently on Dilaudid 1 to 2 mg IV every 3 hours as needed for pain -We will discontinue IV Dilaudid and start Dilaudid 2 mg every 4 hours as needed for pain -We will also start MS Contin 15 mg p.o. every 12 hours. -Continue close monitoring with continuous pulse oximetry for potential respiratory depression with opioids -GI has signed off  Diabetes mellitus type 2 -Continue sliding scale insulin with NovoLog -CBG well controlled  Hypertension -Blood pressure stable -Dose of Norvasc increased to 10 mg daily -ACE inhibitor discontinued due to concern for pancreatitis  Hyperlipidemia -LDL 102, he was on statins which she stopped due to myalgias  Hypothyroidism -Continue Synthroid  Pancytopenia -Likely from recent  chemotherapy -Improving  History portal vein thrombus -Continue Eliquis   Medications     amLODipine  10 mg Oral Daily   apixaban  5 mg Oral BID   bisacodyl  10 mg Rectal Once   Chlorhexidine Gluconate Cloth  6 each Topical Daily   insulin aspart  0-9 Units Subcutaneous Q4H   levothyroxine  137 mcg Oral Q0600   lipase/protease/amylase  36,000 Units Oral TID AC   morphine  15 mg Oral Q12H   prochlorperazine  10 mg Oral BID   senna-docusate  2 tablet Oral QHS     Data Reviewed:   CBG:  Recent Labs  Lab 02/18/21 1937 02/19/21 0034 02/19/21 0356 02/19/21 0733 02/19/21 1123  GLUCAP 152* 130* 120* 122* 164*    SpO2: 97 %    Vitals:   02/18/21 0429 02/18/21 1202 02/18/21 1940 02/19/21 0355  BP: 126/77 110/74 136/86 128/84  Pulse: 67 81 87 79  Resp: 17 18 18 18   Temp: 98.3 F (36.8 C) 98.4 F (36.9 C) 98 F (36.7 C) 98.4 F (36.9 C)  TempSrc:  Oral  Oral  SpO2: 96% 95% 98% 97%  Weight:      Height:         Intake/Output Summary (Last 24 hours) at 02/19/2021 1409 Last data filed at 02/19/2021 0921 Gross per 24 hour  Intake 2140.77 ml  Output --  Net 2140.77 ml    01/04 1901 - 01/06 0700 In: 5088.9 [P.O.:590; I.V.:4498.9] Out: -   Filed Weights   02/12/21 1633  Weight: 81.5 kg    Data Reviewed: Basic Metabolic Panel: Recent Labs  Lab 02/12/21 2000 02/13/21 0500 02/13/21 0844 02/13/21 1440 02/14/21 0502 02/15/21  0540 02/16/21 0424 02/17/21 0419  NA  --  141 135  --  137 136 138 138  K  --  2.1* 4.1  --  3.8 3.5 3.2* 3.6  CL  --  125* 105  --  106 110 108 107  CO2  --  13* 21*  --  20* 21* 23 21*  GLUCOSE  --  82 149*  --  116* 123* 120* 121*  BUN  --  8 14  --  12 7* 6* 6*  CREATININE  --  0.45* 0.89  --  0.73 0.81 0.83 0.79  CALCIUM  --  4.5* 8.7*  --  8.7* 8.5* 8.8* 8.7*  MG 2.0 1.1*  --  1.9  --   --  1.8  --   PHOS 4.1 1.9*  --  3.7  --   --   --   --    Liver Function Tests: Recent Labs  Lab 02/13/21 0844 02/14/21 0502  02/15/21 0540 02/16/21 0424 02/17/21 0419  AST 12* 12* 13* 12* 13*  ALT 11 10 11 11 10   ALKPHOS 76 73 71 71 68  BILITOT 0.9 0.8 1.0 0.8 0.9  PROT 6.8 6.5 6.3* 6.2* 6.4*  ALBUMIN 3.8 3.5 3.4* 3.5 3.4*   Recent Labs  Lab 02/12/21 1733 02/14/21 0502 02/15/21 0540 02/16/21 0424 02/17/21 0419  LIPASE 21 23 540* 20 20   No results for input(s): AMMONIA in the last 168 hours. CBC: Recent Labs  Lab 02/12/21 1733 02/13/21 0500 02/14/21 0502 02/15/21 0540 02/16/21 0424 02/17/21 0419  WBC 2.7* 3.0* 3.0* 2.9* 2.7* 3.6*  NEUTROABS 1.4* 1.6*  --   --   --   --   HGB 10.5* 10.4* 9.7* 9.9* 10.1* 10.3*  HCT 31.1* 31.2* 29.2* 28.7* 29.2* 30.0*  MCV 92.8 94.0 94.2 92.9 93.3 93.2  PLT 93* 88* 73* 75* 82* 112*   Cardiac Enzymes: Recent Labs  Lab 02/12/21 2000  CKTOTAL 53   BNP (last 3 results) No results for input(s): BNP in the last 8760 hours.  ProBNP (last 3 results) No results for input(s): PROBNP in the last 8760 hours.  CBG: Recent Labs  Lab 02/18/21 1937 02/19/21 0034 02/19/21 0356 02/19/21 0733 02/19/21 1123  GLUCAP 152* 130* 120* 122* 164*       Radiology Reports  No results found.     Antibiotics: Anti-infectives (From admission, onward)    None         DVT prophylaxis: Apixaban  Code Status: DNR  Family Communication: No family at bedside   Consultants: Oncology Gastroenterology  Procedures:     Objective    Physical Examination:  General-appears in no acute distress Heart-S1-S2, regular, no murmur auscultated Lungs-clear to auscultation bilaterally, no wheezing or crackles auscultated Abdomen-soft, mild tenderness in epigastric region, no organomegaly Extremities-no edema in the lower extremities Neuro-alert, oriented x3, no focal deficit noted  Status is: Inpatient  Dispo: The patient is from: Home              Anticipated d/c is to: Home              Anticipated d/c date is: 02/20/2021              Patient  currently not stable for discharge  Barrier to discharge-ongoing management for pain control for pancreatitis  COVID-19 Labs  No results for input(s): DDIMER, FERRITIN, LDH, CRP in the last 72 hours.  Lab Results  Component Value Date  [place] NEGATIVE 02/12/2021   [place] NEGATIVE 07/13/2020   SARSCOV2NAA NEGATIVE 06/15/2020   SARSCOV2NAA RESULT: NEGATIVE 01/22/2020            Recent Results (from the past 240 hour(s))  Resp Panel by RT-PCR (Flu A&B, Covid) Nasopharyngeal Swab     Status: None   Collection Time: 02/12/21  5:49 PM   Specimen: Nasopharyngeal Swab; Nasopharyngeal(NP) swabs in vial transport medium  Result Value Ref Range Status   SARS Coronavirus 2 by RT PCR NEGATIVE NEGATIVE Final    Comment: (NOTE) SARS-CoV-2 target nucleic acids are NOT DETECTED.  The SARS-CoV-2 RNA is generally detectable in upper respiratory specimens during the acute phase of infection. The lowest concentration of SARS-CoV-2 viral copies this assay can detect is 138 copies/mL. A negative result does not preclude SARS-Cov-2 infection and should not be used as the sole basis for treatment or other patient management decisions. A negative result may occur with  improper specimen collection/handling, submission of specimen other than nasopharyngeal swab, presence of viral mutation(s) within the areas targeted by this assay, and inadequate number of viral copies(<138 copies/mL). A negative result must be combined with clinical observations, patient history, and epidemiological information. The expected result is Negative.  Fact Sheet for Patients:  EntrepreneurPulse.com.au  Fact Sheet for Healthcare Providers:  IncredibleEmployment.be  This test is no t yet approved or cleared by the [place] FDA and  has been authorized for detection and/or diagnosis of SARS-CoV-2 by FDA under an Emergency Use Authorization (EUA). This EUA will  remain  in effect (meaning this test can be used) for the duration of the COVID-19 declaration under Section 564(b)(1) of the Act, 21 U.S.C.section 360bbb-3(b)(1), unless the authorization is terminated  or revoked sooner.       Influenza A by PCR NEGATIVE NEGATIVE Final   Influenza B by PCR NEGATIVE NEGATIVE Final    Comment: (NOTE) The Xpert Xpress SARS-CoV-2/FLU/RSV plus assay is intended as an aid in the diagnosis of influenza from Nasopharyngeal swab specimens and should not be used as a sole basis for treatment. Nasal washings and aspirates are unacceptable for Xpert Xpress SARS-CoV-2/FLU/RSV testing.  Fact Sheet for Patients: EntrepreneurPulse.com.au  Fact Sheet for Healthcare Providers: IncredibleEmployment.be  This test is not yet approved or cleared by the [place] FDA and has been authorized for detection and/or diagnosis of SARS-CoV-2 by FDA under an Emergency Use Authorization (EUA). This EUA will remain in effect (meaning this test can be used) for the duration of the COVID-19 declaration under Section 564(b)(1) of the Act, 21 U.S.C. section 360bbb-3(b)(1), unless the authorization is terminated or revoked.  Performed at [facility], [place] [address], [place], [place] [address]     Oswald Hillock   [facility] If 7PM-7AM, please contact night-coverage at [email or web address], Office  [phone]   02/19/2021, 2:09 PM  LOS: 7 days

## 2021-02-19 NOTE — Progress Notes (Signed)
This encounter was created in error - please disregard.

## 2021-02-20 LAB — GLUCOSE, CAPILLARY
Glucose-Capillary: 110 mg/dL — ABNORMAL HIGH (ref 70–99)
Glucose-Capillary: 124 mg/dL — ABNORMAL HIGH (ref 70–99)
Glucose-Capillary: 131 mg/dL — ABNORMAL HIGH (ref 70–99)
Glucose-Capillary: 146 mg/dL — ABNORMAL HIGH (ref 70–99)
Glucose-Capillary: 172 mg/dL — ABNORMAL HIGH (ref 70–99)
Glucose-Capillary: 219 mg/dL — ABNORMAL HIGH (ref 70–99)

## 2021-02-20 MED ORDER — HYDROMORPHONE HCL 1 MG/ML IJ SOLN
2.0000 mg | Freq: Once | INTRAMUSCULAR | Status: AC
  Administered 2021-02-20: 2 mg via INTRAVENOUS
  Filled 2021-02-20: qty 2

## 2021-02-20 MED ORDER — SODIUM CHLORIDE 0.9% FLUSH
10.0000 mL | INTRAVENOUS | Status: DC | PRN
  Administered 2021-02-23: 10 mL

## 2021-02-20 MED ORDER — BISACODYL 5 MG PO TBEC
10.0000 mg | DELAYED_RELEASE_TABLET | Freq: Every day | ORAL | Status: DC | PRN
  Administered 2021-02-20 – 2021-02-21 (×2): 10 mg via ORAL
  Filled 2021-02-20 (×2): qty 2

## 2021-02-20 MED ORDER — OXYCODONE HCL ER 15 MG PO T12A: 15.0000 mg | EXTENDED_RELEASE_TABLET | Freq: Two times a day (BID) | ORAL | Status: DC

## 2021-02-20 MED ORDER — OXYCODONE HCL ER 15 MG PO T12A
15.0000 mg | EXTENDED_RELEASE_TABLET | Freq: Two times a day (BID) | ORAL | Status: DC
  Administered 2021-02-20 – 2021-02-21 (×2): 15 mg via ORAL
  Filled 2021-02-20 (×2): qty 1

## 2021-02-20 MED ORDER — SODIUM CHLORIDE 0.9% FLUSH
10.0000 mL | Freq: Two times a day (BID) | INTRAVENOUS | Status: DC
  Administered 2021-02-21 – 2021-02-23 (×5): 10 mL

## 2021-02-20 MED ORDER — POLYETHYLENE GLYCOL 3350 17 G PO PACK
17.0000 g | PACK | Freq: Every day | ORAL | Status: DC
  Administered 2021-02-22 – 2021-02-23 (×2): 17 g via ORAL
  Filled 2021-02-20 (×2): qty 1

## 2021-02-20 MED ORDER — MORPHINE SULFATE ER 15 MG PO TBCR
15.0000 mg | EXTENDED_RELEASE_TABLET | Freq: Once | ORAL | Status: AC
  Administered 2021-02-20: 15 mg via ORAL
  Filled 2021-02-20: qty 1

## 2021-02-20 NOTE — Progress Notes (Signed)
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  CT abdomen/pelvis showed acute edematous pancreatitis.    Subjective   Patient continues to have abdominal pain with eating.  Says IV Dilaudid was better than p.o. Dilaudid.  He was started on MS Contin 15 mg p.o. twice daily along with Dilaudid 2 mg every 4 hours as needed for pain.   Assessment/Plan:    Acute pancreatitis -Patient has history of stage IV pancreatic cancer with mets to liver and lungs -CT findings consistent with acute interstitial edematous pancreatitis -He was undergoing chemotherapy prior to admission -Pancreatitis is thought to be due to Xeloda -Oncology was consulted, Xeloda is being held -Oncology considering to transition him to 5-FU and Gemzar combination instead of Xeloda -Lipase is down to 20 -Diet was advanced to low-fat diet as per GI recommendation -Initially started on IV Dilaudid 2 mg every 3 hours as needed for pain -IV Dilaudid was discontinued and patient started on Dilaudid 2 mg every 4 hours as needed for pain -Also started on MS Contin 15 mg p.o. every 12 hours -I will discontinue MS Contin and start him on OxyContin 15 mg every 12 hours -Continue Dilaudid 2 mg every 4 hours as needed -GI has signed off  Diabetes mellitus type 2 -Continue sliding scale insulin with NovoLog -CBG well controlled  Hypertension -Blood pressure stable -Dose of Norvasc increased to 10 mg daily -ACE inhibitor discontinued due to concern for pancreatitis  Hyperlipidemia -LDL 102, he was on statins which she stopped due to myalgias  Hypothyroidism -Continue  Synthroid  Pancytopenia -Likely from recent chemotherapy -Improving  History portal vein thrombus -Continue Eliquis   Medications     amLODipine  10 mg Oral Daily   apixaban  5 mg Oral BID   bisacodyl  10 mg Rectal Once   Chlorhexidine Gluconate Cloth  6 each Topical Daily   insulin aspart  0-9 Units Subcutaneous Q4H   levothyroxine  137 mcg Oral Q0600   lipase/protease/amylase  36,000 Units Oral TID AC   oxyCODONE  15 mg Oral Q12H   polyethylene glycol  17 g Oral Daily   prochlorperazine  10 mg Oral BID   sodium chloride flush  10-40 mL Intracatheter Q12H     Data Reviewed:   CBG:  Recent Labs  Lab 02/19/21 2321 02/20/21 0435 02/20/21 0748 02/20/21 1107 02/20/21 1529  GLUCAP 123* 110* 124* 146* 172*    SpO2: 97 %    Vitals:   02/19/21 0355 02/19/21 1949 02/20/21 0430 02/20/21 1310  BP: 128/84 (!) 156/87 (!) 145/88 128/86  Pulse: 79 89 77 79  Resp: 18 20 18 16   Temp: 98.4 F (36.9 C) 98.3 F (36.8 C) 98.4 F (36.9 C) 98.3 F (36.8 C)  TempSrc: Oral Oral Oral Oral  SpO2: 97% 98% 98% 97%  Weight:      Height:         Intake/Output Summary (Last 24 hours) at 02/20/2021 1548 Last data filed at 02/20/2021 1314 Gross per 24 hour  Intake 934.28 ml  Output --  Net 934.28 ml    01/05 1901 - 01/07 0700 In: 1882.3 [P.O.:236; I.V.:1646.3] Out: -   [employer]  02/12/21 1633  Weight: 81.5 kg    Data Reviewed: Basic Metabolic Panel: Recent Labs  Lab 02/14/21 0502 02/15/21 0540 02/16/21 0424 02/17/21 0419  NA 137 136 138 138  K 3.8 3.5 3.2* 3.6  CL 106 110 108 107  CO2 20* 21* 23 21*  GLUCOSE 116* 123* 120* 121*  BUN 12 7* 6* 6*  CREATININE 0.73 0.81 0.83 0.79  CALCIUM 8.7* 8.5* 8.8* 8.7*  MG  --   --  1.8  --    Liver Function Tests: Recent Labs  Lab 02/14/21 0502 02/15/21 0540 02/16/21 0424 02/17/21 0419  AST 12* 13* 12* 13*  ALT 10 11 11 10   ALKPHOS 73 71 71 68  BILITOT 0.8 1.0 0.8 0.9  PROT 6.5 6.3* 6.2* 6.4*  ALBUMIN 3.5  3.4* 3.5 3.4*   Recent Labs  Lab 02/14/21 0502 02/15/21 0540 02/16/21 0424 02/17/21 0419  LIPASE 23 540* 20 20   No results for input(s): AMMONIA in the last 168 hours. CBC: Recent Labs  Lab 02/14/21 0502 02/15/21 0540 02/16/21 0424 02/17/21 0419  WBC 3.0* 2.9* 2.7* 3.6*  HGB 9.7* 9.9* 10.1* 10.3*  HCT 29.2* 28.7* 29.2* 30.0*  MCV 94.2 92.9 93.3 93.2  PLT 73* 75* 82* 112*   Cardiac Enzymes: No results for input(s): CKTOTAL, CKMB, CKMBINDEX, TROPONINI in the last 168 hours.  BNP (last 3 results) No results for input(s): BNP in the last 8760 hours.  ProBNP (last 3 results) No results for input(s): PROBNP in the last 8760 hours.  CBG: Recent Labs  Lab 02/19/21 2321 02/20/21 0435 02/20/21 0748 02/20/21 1107 02/20/21 1529  GLUCAP 123* 110* 124* 146* 172*       Radiology Reports  No results found.     Antibiotics: Anti-infectives (From admission, onward)    None         DVT prophylaxis: Apixaban  Code Status: DNR  Family Communication: No family at bedside   Consultants: Oncology Gastroenterology  Procedures:     Objective    Physical Examination:  General-appears in no acute distress Heart-S1-S2, regular, no murmur auscultated Lungs-clear to auscultation bilaterally, no wheezing or crackles auscultated Abdomen-soft, mild tenderness in left upper quadrant , no organomegaly Extremities-no edema in the lower extremities Neuro-alert, oriented x3, no focal deficit noted  Status is: Inpatient  Dispo: The patient is from: Home              Anticipated d/c is to: Home              Anticipated d/c date is: 02/22/2021              Patient currently not stable for discharge  Barrier to discharge-ongoing management for pain control for pancreatitis  COVID-19 Labs  No results for input(s): DDIMER, FERRITIN, LDH, CRP in the last 72 hours.  Lab Results  Component Value Date   [place] NEGATIVE 02/12/2021   [place] NEGATIVE  07/13/2020   [place] NEGATIVE 06/15/2020   SARSCOV2NAA RESULT: NEGATIVE 01/22/2020            Recent Results (from the past 240 hour(s))  Resp Panel by RT-PCR (Flu A&B, Covid) Nasopharyngeal Swab     Status: None   Collection Time: 02/12/21  5:49 PM   Specimen: Nasopharyngeal Swab; Nasopharyngeal(NP) swabs in vial transport medium  Result Value Ref Range Status   SARS Coronavirus 2 by RT PCR NEGATIVE NEGATIVE Final    Comment: (NOTE) SARS-CoV-2 target nucleic acids are NOT DETECTED.  The SARS-CoV-2 RNA  is generally detectable in upper respiratory specimens during the acute phase of infection. The lowest concentration of SARS-CoV-2 viral copies this assay can detect is 138 copies/mL. A negative result does not preclude SARS-Cov-2 infection and should not be used as the sole basis for treatment or other patient management decisions. A negative result may occur with  improper specimen collection/handling, submission of specimen other than nasopharyngeal swab, presence of viral mutation(s) within the areas targeted by this assay, and inadequate number of viral copies(<138 copies/mL). A negative result must be combined with clinical observations, patient history, and epidemiological information. The expected result is Negative.  Fact Sheet for Patients:  EntrepreneurPulse.com.au  Fact Sheet for Healthcare Providers:  IncredibleEmployment.be  This test is no t yet approved or cleared by the [place] FDA and  has been authorized for detection and/or diagnosis of SARS-CoV-2 by FDA under an Emergency Use Authorization (EUA). This EUA will remain  in effect (meaning this test can be used) for the duration of the COVID-19 declaration under Section 564(b)(1) of the Act, 21 U.S.C.section 360bbb-3(b)(1), unless the authorization is terminated  or revoked sooner.       Influenza A by PCR NEGATIVE NEGATIVE Final   Influenza B by PCR  NEGATIVE NEGATIVE Final    Comment: (NOTE) The Xpert Xpress SARS-CoV-2/FLU/RSV plus assay is intended as an aid in the diagnosis of influenza from Nasopharyngeal swab specimens and should not be used as a sole basis for treatment. Nasal washings and aspirates are unacceptable for Xpert Xpress SARS-CoV-2/FLU/RSV testing.  Fact Sheet for Patients: EntrepreneurPulse.com.au  Fact Sheet for Healthcare Providers: IncredibleEmployment.be  This test is not yet approved or cleared by the [place] FDA and has been authorized for detection and/or diagnosis of SARS-CoV-2 by FDA under an Emergency Use Authorization (EUA). This EUA will remain in effect (meaning this test can be used) for the duration of the COVID-19 declaration under Section 564(b)(1) of the Act, 21 U.S.C. section 360bbb-3(b)(1), unless the authorization is terminated or revoked.  Performed at [facility], [place] [address], [place], [place] [address]     Oswald Hillock   [facility] If 7PM-7AM, please contact night-coverage at [email or web address], Office  [phone]   02/20/2021, 3:48 PM  LOS: 8 days

## 2021-02-20 NOTE — Progress Notes (Signed)
Pt pain was not well controlled over night remained >7 on scale 1-10. Pt did not get much if any sleep throughout the night.

## 2021-02-21 ENCOUNTER — Encounter: Payer: Self-pay | Admitting: Hematology and Oncology

## 2021-02-21 ENCOUNTER — Inpatient Hospital Stay (HOSPITAL_COMMUNITY): Payer: 59

## 2021-02-21 DIAGNOSIS — R1013 Epigastric pain: Secondary | ICD-10-CM

## 2021-02-21 LAB — CBC WITH DIFFERENTIAL/PLATELET
Abs Immature Granulocytes: 0.01 10*3/uL (ref 0.00–0.07)
Basophils Absolute: 0 10*3/uL (ref 0.0–0.1)
Basophils Relative: 1 %
Eosinophils Absolute: 0.1 10*3/uL (ref 0.0–0.5)
Eosinophils Relative: 3 %
HCT: 31.3 % — ABNORMAL LOW (ref 39.0–52.0)
Hemoglobin: 10.6 g/dL — ABNORMAL LOW (ref 13.0–17.0)
Immature Granulocytes: 0 %
Lymphocytes Relative: 22 %
Lymphs Abs: 0.8 10*3/uL (ref 0.7–4.0)
MCH: 31.2 pg (ref 26.0–34.0)
MCHC: 33.9 g/dL (ref 30.0–36.0)
MCV: 92.1 fL (ref 80.0–100.0)
Monocytes Absolute: 0.4 10*3/uL (ref 0.1–1.0)
Monocytes Relative: 11 %
Neutro Abs: 2.3 10*3/uL (ref 1.7–7.7)
Neutrophils Relative %: 63 %
Platelets: 205 10*3/uL (ref 150–400)
RBC: 3.4 MIL/uL — ABNORMAL LOW (ref 4.22–5.81)
RDW: 13.5 % (ref 11.5–15.5)
WBC: 3.7 10*3/uL — ABNORMAL LOW (ref 4.0–10.5)
nRBC: 0 % (ref 0.0–0.2)

## 2021-02-21 LAB — BASIC METABOLIC PANEL
Anion gap: 11 (ref 5–15)
BUN: 9 mg/dL (ref 8–23)
CO2: 24 mmol/L (ref 22–32)
Calcium: 9.2 mg/dL (ref 8.9–10.3)
Chloride: 103 mmol/L (ref 98–111)
Creatinine, Ser: 0.87 mg/dL (ref 0.61–1.24)
GFR, Estimated: >60 mL/min (ref >60)
Glucose, Bld: 175 mg/dL — ABNORMAL HIGH (ref 70–99)
Potassium: 3.4 mmol/L — ABNORMAL LOW (ref 3.5–5.1)
Sodium: 138 mmol/L (ref 135–145)

## 2021-02-21 LAB — GLUCOSE, CAPILLARY
Glucose-Capillary: 142 mg/dL — ABNORMAL HIGH (ref 70–99)
Glucose-Capillary: 143 mg/dL — ABNORMAL HIGH (ref 70–99)
Glucose-Capillary: 151 mg/dL — ABNORMAL HIGH (ref 70–99)
Glucose-Capillary: 184 mg/dL — ABNORMAL HIGH (ref 70–99)
Glucose-Capillary: 197 mg/dL — ABNORMAL HIGH (ref 70–99)

## 2021-02-21 MED ORDER — KETOROLAC TROMETHAMINE 30 MG/ML IJ SOLN: 30.0000 mg | Freq: Once | INTRAMUSCULAR | Status: DC

## 2021-02-21 MED ORDER — OXYCODONE HCL 5 MG PO TABS
10.0000 mg | ORAL_TABLET | Freq: Once | ORAL | Status: AC
  Administered 2021-02-21: 10 mg via ORAL
  Filled 2021-02-21: qty 2

## 2021-02-21 MED ORDER — IOHEXOL 350 MG/ML SOLN
80.0000 mL | Freq: Once | INTRAVENOUS | Status: AC | PRN
  Administered 2021-02-21: 80 mL via INTRAVENOUS

## 2021-02-21 MED ORDER — OXYCODONE HCL ER 20 MG PO T12A
20.0000 mg | EXTENDED_RELEASE_TABLET | Freq: Two times a day (BID) | ORAL | Status: DC
Start: 2021-02-21 — End: 2021-02-23
  Administered 2021-02-21 – 2021-02-23 (×4): 20 mg via ORAL
  Filled 2021-02-21 (×4): qty 1

## 2021-02-21 MED ORDER — IOHEXOL 9 MG/ML PO SOLN
ORAL | Status: AC
  Filled 2021-02-21: qty 1000

## 2021-02-21 MED ORDER — IOHEXOL 9 MG/ML PO SOLN
500.0000 mL | ORAL | Status: AC
  Administered 2021-02-21 (×2): 500 mL via ORAL

## 2021-02-21 MED ORDER — LORAZEPAM 0.5 MG PO TABS
0.5000 mg | ORAL_TABLET | Freq: Four times a day (QID) | ORAL | Status: DC | PRN
  Administered 2021-02-21 – 2021-02-25 (×13): 0.5 mg via ORAL
  Filled 2021-02-21 (×13): qty 1

## 2021-02-21 NOTE — Progress Notes (Signed)
please adviseDarrick Page, G MD advised day shift RN Erin Sons he would increase OxyCOntin to 20 mg and per his notes "Patient feels little better with OxyContin, will increase the dose of OxyContin to 20 mg every 12 hour" however it was not ordered or changed.

## 2021-02-21 NOTE — Progress Notes (Signed)
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  CT abdomen/pelvis showed acute edematous pancreatitis.    Subjective   Patient seen and examined, pain little better but still has left upper quadrant tenderness to palpation.  Eating very little due to pain.  As per patient's wife this is not usual for this patient.  Usually he works 5 days a week.  He does take chronic opioids at home but this pain is far worse than what he usually gets at home.   Assessment/Plan:    Acute pancreatitis -Patient has history of stage IV pancreatic cancer with mets to liver and lungs -CT findings consistent with acute interstitial edematous pancreatitis -He was undergoing chemotherapy prior to admission -Pancreatitis is thought to be due to Xeloda -Oncology was consulted, Xeloda is being held -Oncology considering to transition him to 5-FU and Gemzar combination instead of Xeloda -Lipase is down to 20 -Diet was advanced to low-fat diet as per GI recommendation -Initially started on IV Dilaudid 2 mg every 3 hours as needed for pain -IV Dilaudid was discontinued and patient started on Dilaudid 2 mg every 4 hours as needed for pain -Also started on MS Contin 15 mg p.o. every 12 hours -As pain was not controlled so MS Contin was discontinued and patient started on OxyContin 15 mg every 12 hours -Continue Dilaudid 2 mg every 4 hours as needed -Patient feels little better with OxyContin, will increase the dose of OxyContin to 20 mg every 12 hour -GI had signed off, we will reconsult GI  Diabetes mellitus type 2 -Continue sliding scale insulin with NovoLog -CBG well  controlled  Hypertension -Blood pressure stable -Dose of Norvasc increased to 10 mg daily -ACE inhibitor discontinued due to concern for pancreatitis  Hyperlipidemia -LDL 102, he was on statins which she stopped due to myalgias  Hypothyroidism -Continue Synthroid  Pancytopenia -Likely from recent chemotherapy -Improving  History portal vein thrombus -Continue Eliquis   Medications     amLODipine  10 mg Oral Daily   apixaban  5 mg Oral BID   bisacodyl  10 mg Rectal Once   Chlorhexidine Gluconate Cloth  6 each Topical Daily   insulin aspart  0-9 Units Subcutaneous Q4H   iohexol  500 mL Oral Q1H   levothyroxine  137 mcg Oral Q0600   lipase/protease/amylase  36,000 Units Oral TID AC   oxyCODONE  15 mg Oral Q12H   polyethylene glycol  17 g Oral Daily   prochlorperazine  10 mg Oral BID   sodium chloride flush  10-40 mL Intracatheter Q12H     Data Reviewed:   CBG:  Recent Labs  Lab 02/20/21 1529 02/20/21 1940 02/20/21 2356 02/21/21 0321 02/21/21 1056  GLUCAP 172* 219* 131* 142* 184*    SpO2: 97 %    Vitals:   02/20/21 0430 02/20/21 1310 02/20/21 1940 02/21/21 0321  BP: (!) 145/88 128/86 128/82 133/90  Pulse: 77 79 84 79  Resp: 18 16 16 16   Temp: 98.4 F (36.9 C) 98.3 F (36.8 C) 98.2 F (36.8 C) 98.9 F (37.2 C)  TempSrc: Oral Oral Oral Oral  SpO2: 98% 97% 98% 97%  Weight:  Height:         Intake/Output Summary (Last 24 hours) at 02/21/2021 1654 Last data filed at 02/21/2021 1015 Gross per 24 hour  Intake 120 ml  Output --  Net 120 ml    01/06 1901 - 01/08 0700 In: 360 [P.O.:360] Out: -   [employer]   02/12/21 1633  Weight: 81.5 kg    Data Reviewed: Basic Metabolic Panel: Recent Labs  Lab 02/15/21 0540 02/16/21 0424 02/17/21 0419  NA 136 138 138  K 3.5 3.2* 3.6  CL 110 108 107  CO2 21* 23 21*  GLUCOSE 123* 120* 121*  BUN 7* 6* 6*  CREATININE 0.81 0.83 0.79  CALCIUM 8.5* 8.8* 8.7*  MG  --  1.8  --    Liver Function  Tests: Recent Labs  Lab 02/15/21 0540 02/16/21 0424 02/17/21 0419  AST 13* 12* 13*  ALT 11 11 10   ALKPHOS 71 71 68  BILITOT 1.0 0.8 0.9  PROT 6.3* 6.2* 6.4*  ALBUMIN 3.4* 3.5 3.4*   Recent Labs  Lab 02/15/21 0540 02/16/21 0424 02/17/21 0419  LIPASE 540* 20 20   No results for input(s): AMMONIA in the last 168 hours. CBC: Recent Labs  Lab 02/15/21 0540 02/16/21 0424 02/17/21 0419 02/21/21 1530  WBC 2.9* 2.7* 3.6* 3.7*  NEUTROABS  --   --   --  2.3  HGB 9.9* 10.1* 10.3* 10.6*  HCT 28.7* 29.2* 30.0* 31.3*  MCV 92.9 93.3 93.2 92.1  PLT 75* 82* 112* 205   Cardiac Enzymes: No results for input(s): CKTOTAL, CKMB, CKMBINDEX, TROPONINI in the last 168 hours.  BNP (last 3 results) No results for input(s): BNP in the last 8760 hours.  ProBNP (last 3 results) No results for input(s): PROBNP in the last 8760 hours.  CBG: Recent Labs  Lab 02/20/21 1529 02/20/21 1940 02/20/21 2356 02/21/21 0321 02/21/21 1056  GLUCAP 172* 219* 131* 142* 184*       Radiology Reports  No results found.     Antibiotics: Anti-infectives (From admission, onward)    None         DVT prophylaxis: Apixaban  Code Status: DNR  Family Communication: No family at bedside   Consultants: Oncology Gastroenterology  Procedures:     Objective    Physical Examination:  General-appears in no acute distress Heart-S1-S2, regular, no murmur auscultated Lungs-clear to auscultation bilaterally, no wheezing or crackles auscultated Abdomen-soft, positive tenderness to palpation in left upper quadrant Extremities-no edema in the lower extremities Neuro-alert, oriented x3, no focal deficit noted  Status is: Inpatient  Dispo: The patient is from: Home              Anticipated d/c is to: Home              Anticipated d/c date is: 02/22/2021              Patient currently not stable for discharge  Barrier to discharge-ongoing management for pain control for  pancreatitis  COVID-19 Labs  No results for input(s): DDIMER, FERRITIN, LDH, CRP in the last 72 hours.  Lab Results  Component Value Date   [place] NEGATIVE 02/12/2021   [place] NEGATIVE 07/13/2020   [place] NEGATIVE 06/15/2020   SARSCOV2NAA RESULT: NEGATIVE 01/22/2020            Recent Results (from the past 240 hour(s))  Resp Panel by RT-PCR (Flu A&B, Covid) Nasopharyngeal Swab     Status: None   Collection Time: 02/12/21  5:49 PM  Specimen: Nasopharyngeal Swab; Nasopharyngeal(NP) swabs in vial transport medium  Result Value Ref Range Status   SARS Coronavirus 2 by RT PCR NEGATIVE NEGATIVE Final    Comment: (NOTE) SARS-CoV-2 target nucleic acids are NOT DETECTED.  The SARS-CoV-2 RNA is generally detectable in upper respiratory specimens during the acute phase of infection. The lowest concentration of SARS-CoV-2 viral copies this assay can detect is 138 copies/mL. A negative result does not preclude SARS-Cov-2 infection and should not be used as the sole basis for treatment or other patient management decisions. A negative result may occur with  improper specimen collection/handling, submission of specimen other than nasopharyngeal swab, presence of viral mutation(s) within the areas targeted by this assay, and inadequate number of viral copies(<138 copies/mL). A negative result must be combined with clinical observations, patient history, and epidemiological information. The expected result is Negative.  Fact Sheet for Patients:  EntrepreneurPulse.com.au  Fact Sheet for Healthcare Providers:  IncredibleEmployment.be  This test is no t yet approved or cleared by the [place] FDA and  has been authorized for detection and/or diagnosis of SARS-CoV-2 by FDA under an Emergency Use Authorization (EUA). This EUA will remain  in effect (meaning this test can be used) for the duration of the COVID-19 declaration under  Section 564(b)(1) of the Act, 21 U.S.C.section 360bbb-3(b)(1), unless the authorization is terminated  or revoked sooner.       Influenza A by PCR NEGATIVE NEGATIVE Final   Influenza B by PCR NEGATIVE NEGATIVE Final    Comment: (NOTE) The Xpert Xpress SARS-CoV-2/FLU/RSV plus assay is intended as an aid in the diagnosis of influenza from Nasopharyngeal swab specimens and should not be used as a sole basis for treatment. Nasal washings and aspirates are unacceptable for Xpert Xpress SARS-CoV-2/FLU/RSV testing.  Fact Sheet for Patients: EntrepreneurPulse.com.au  Fact Sheet for Healthcare Providers: IncredibleEmployment.be  This test is not yet approved or cleared by the [place] FDA and has been authorized for detection and/or diagnosis of SARS-CoV-2 by FDA under an Emergency Use Authorization (EUA). This EUA will remain in effect (meaning this test can be used) for the duration of the COVID-19 declaration under Section 564(b)(1) of the Act, 21 U.S.C. section 360bbb-3(b)(1), unless the authorization is terminated or revoked.  Performed at [facility], [place] [address], [place], [place] [address]     Oswald Hillock   [facility] If 7PM-7AM, please contact night-coverage at [email or web address], Office  [phone]   02/21/2021, 4:54 PM  LOS: 9 days

## 2021-02-21 NOTE — Progress Notes (Signed)
Pt states pain remains 7 or greater throughout the night eben with the extra dose 15mg  PO Oxycontin provided

## 2021-02-21 NOTE — Progress Notes (Addendum)
[facility]  We were called in re consultation on this patient today with acute pancreatitis in the setting of known pancreatic cancer.  We had previously signed off on 1/4 when patient began tolerating clears.    Today, per hospitalist,  patient is still complaining of pain and wife is requesting further imaging/workup.  Last CBC 02/18/20 witrh normal WBC count, Lipase at that time had returned to normal, LFT's normal  Last CT 12/30 with pancreatitis  Assessment: Acute pancreatitis in a patient with know pancreatic cancer undergoing chemotherapy  Plan: Ordered repeat CT A/P with contrast today Repeat CBC Continue pain management Likely all of the pain is from known pancreatic cancer and pancreatitis, but will reimage to consider other sequela Discussed plan with hospitalist I discussed above plan with Dr. Henrene Pastor.    We will see patient tomorrow face to face. Dr. Hilarie Fredrickson is taking over our service.  Ellouise Newer, PA-C  GI ATTENDING  Asked to see for ongoing pain. Case history and extensive studies personally reviewed. Pain secondary to pancreatitis and/or cancer. Not much to do other than supportive care and pain management. However, will recheck labs, CT imaging, and have GI inpatient team get re involved in care. Thanks   Docia Chuck. Geri [place]., M.D. [facility] Division of Gastroenterology

## 2021-02-22 ENCOUNTER — Encounter: Payer: Self-pay | Admitting: Hematology and Oncology

## 2021-02-22 DIAGNOSIS — K859 Acute pancreatitis without necrosis or infection, unspecified: Secondary | ICD-10-CM

## 2021-02-22 DIAGNOSIS — K5903 Drug induced constipation: Secondary | ICD-10-CM | POA: Diagnosis not present

## 2021-02-22 DIAGNOSIS — R1013 Epigastric pain: Secondary | ICD-10-CM

## 2021-02-22 LAB — GLUCOSE, CAPILLARY
Glucose-Capillary: 158 mg/dL — ABNORMAL HIGH (ref 70–99)
Glucose-Capillary: 120 mg/dL — ABNORMAL HIGH (ref 70–99)
Glucose-Capillary: 257 mg/dL — ABNORMAL HIGH (ref 70–99)
Glucose-Capillary: 145 mg/dL — ABNORMAL HIGH (ref 70–99)
Glucose-Capillary: 176 mg/dL — ABNORMAL HIGH (ref 70–99)

## 2021-02-22 MED ORDER — BISACODYL 5 MG PO TBEC
20.0000 mg | DELAYED_RELEASE_TABLET | Freq: Once | ORAL | Status: AC
  Administered 2021-02-22: 20 mg via ORAL
  Filled 2021-02-22: qty 4

## 2021-02-22 MED ORDER — BISACODYL 10 MG RE SUPP
10.0000 mg | Freq: Every day | RECTAL | Status: DC | PRN
  Filled 2021-02-22: qty 1

## 2021-02-22 MED ORDER — BISACODYL 5 MG PO TBEC
10.0000 mg | DELAYED_RELEASE_TABLET | Freq: Every day | ORAL | Status: DC
  Administered 2021-02-23: 10 mg via ORAL
  Filled 2021-02-22: qty 2

## 2021-02-22 MED ORDER — POTASSIUM CHLORIDE CRYS ER 20 MEQ PO TBCR
40.0000 meq | EXTENDED_RELEASE_TABLET | Freq: Once | ORAL | Status: AC
  Administered 2021-02-22: 40 meq via ORAL
  Filled 2021-02-22: qty 2

## 2021-02-22 NOTE — Progress Notes (Signed)
Progress Note Hospital Day: 11  Chief Complaint:  pancreatitis . 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 Admitted 02/12/21 with acute pancreatitis  # Pancreatic head cancer, acute pancreatitis, possible secondary to ACE inhibitor,  Xeloda or underlying malignancy. We saw patient in consultation on 02/15/21, signed off on 02/17/21 with recommendations to advance to low fat diet. Called for re-consult 02/21/21 as patient was still having abdominal pain and wife was asking for fur --No significant changes on repeat CTAP w/ contrast yesterday. Still with acute pancreatitis. CBC stable.  --Oncology considering transition to 5 -FU and Gemzar combination at some point  # Acute on chronic constipation, no BM in several days.  --Getting 10 mg of Dulcolax, will continue --Give additional 10 mg of Doculax now  --Has Miralax ordered but isn't taking it. He says that he didn't realize he could take both the miralax and dulcolax. He will take Miralax going forward. Can increase to BID if needed --Dulcolax supp as needed     # Pancytopenia secondary to chemotherapy   DIAGNOSTIC STUDIES THIS ADMISSION:   02/21/21 follow up CTAP w/ contrast IMPRESSION: 1. No significant change in the appearance of the pancreas when compared with 02/12/2021. As noted previously there are changes of acute pancreatitis superimposed on the locally advanced pancreatic neoplasm. 2. Dilated pancreatic duct with multiple low-attenuation foci throughout the pancreatic parenchyma compatible with acute interstitial edematous pancreatitis with dilated side branches. 3. Encasement and marked narrowing of the extrahepatic portal vein and portal venous confluence with secondary collateral vessel formation throughout the upper abdomen. 4. Previously noted distinct focal areas of low  attenuation within the liver are difficult to see on today's study and may have been areas of focal fatty deposition. Been 5. There is mild wall thickening and luminal narrowing involving the terminal ileum without surrounding inflammatory changes. Findings are nonspecific and may reflect sequelae of inflammation or infection versus incomplete distension. 6. Splenomegaly. 7. Stable appearance of lung nodules compatible with known metastatic disease from patient's primary pancreatic neoplasm. 8. Aortic Atherosclerosis (ICD10-I70.0).   SUBJECTIVE   Says he is tolerating a solid diet. Some nausea but no vomiting. He hasn't had a BM in several days. Struggles with constipation at home and increased pain meds making it worse. Continues to have generalized left sided abdominal pain which stays at a level 5.    OBJECTIVE      Scheduled inpatient medications:   amLODipine  10 mg Oral Daily   apixaban  5 mg Oral BID   bisacodyl  10 mg Rectal Once   Chlorhexidine Gluconate Cloth  6 each Topical Daily   insulin aspart  0-9 Units Subcutaneous Q4H   levothyroxine  137 mcg Oral Q0600   lipase/protease/amylase  36,000 Units Oral TID AC   oxyCODONE  20 mg Oral Q12H   polyethylene glycol  17 g Oral Daily   potassium chloride  40 mEq Oral Once   prochlorperazine  10 mg Oral BID   sodium chloride flush  10-40 mL Intracatheter Q12H   Continuous inpatient infusions:  PRN inpatient medications: acetaminophen **OR** acetaminophen, bisacodyl, HYDROmorphone, LORazepam, sodium chloride flush, sodium chloride flush  Vital signs in last 24 hours: Temp:  [98.1 F (36.7 C)] 98.1 F (36.7 C) (01/09 0552) Pulse Rate:  [72-87] 72 (01/09 0552) Resp:  [  17-18] 17 (01/09 0552) BP: (109-151)/(77-91) 109/77 (01/09 0552) SpO2:  [97 %-99 %] 97 % (01/09 0552) Last BM Date: 02/20/21  Intake/Output Summary (Last 24 hours) at 02/22/2021 0904 Last data filed at 02/21/2021 2200 Gross per 24 hour  Intake 130 ml   Output --  Net 130 ml     Physical Exam:  General: Alert male in NAD Heart:  Regular rate and rhythm. No lower extremity edema Pulmonary: Normal respiratory effort Abdomen: Soft, nondistended, moderate LUQ tenderness. . Normal bowel sounds.  Neurologic: Alert and oriented Psych: Pleasant. Cooperative.   Filed Weights   02/12/21 1633  Weight: 81.5 kg    Intake/Output from previous day: 01/08 0701 - 01/09 0700 In: 130 [P.O.:120; I.V.:10] Out: -  Intake/Output this shift: No intake/output data recorded.    Lab Results: Recent Labs    02/21/21 1530  WBC 3.7*  HGB 10.6*  HCT 31.3*  PLT 205   BMET Recent Labs    02/21/21 1600  NA 138  K 3.4*  CL 103  CO2 24  GLUCOSE 175*  BUN 9  CREATININE 0.87  CALCIUM 9.2   LFT No results for input(s): PROT, ALBUMIN, AST, ALT, ALKPHOS, BILITOT, BILIDIR, IBILI in the last 72 hours. PT/INR No results for input(s): LABPROT, INR in the last 72 hours. Hepatitis Panel No results for input(s): HEPBSAG, HCVAB, HEPAIGM, HEPBIGM in the last 72 hours.  CT ABDOMEN PELVIS W CONTRAST  Result Date: 02/21/2021 CLINICAL DATA:  Evaluate for pancreatitis. History of pancreatic neoplasm. EXAM: CT ABDOMEN AND PELVIS WITH CONTRAST TECHNIQUE: Multidetector CT imaging of the abdomen and pelvis was performed using the standard protocol following bolus administration of intravenous contrast. CONTRAST:  69mL OMNIPAQUE IOHEXOL 350 MG/ML SOLN COMPARISON:  02/12/2021 FINDINGS: Lower chest: The imaged portions of the lung bases appear clear scratch scattered lung nodules are again noted bilaterally. The largest is in the left lower lobe measuring 7 mm, image 9/5. This is unchanged from 01/18/2021. These are compatible with known metastatic disease from patient's primary pancreatic neoplasm. Hepatobiliary: There is no suspicious liver lesion. Previously noted areas of hypo attenuation are not seen on today's study. There is moderate diffuse distension of the  gallbladder which appears filled with sludge. Mild central intrahepatic bile duct dilatation. Common bile duct appears nondilated. Pancreas: No significant change in the appearance of the pancreas when compared with 02/12/2021. The pancreas appears diffusely edematous with diffuse peripancreatic inflammatory fat stranding. Numerous low-attenuation foci are again noted throughout the pancreas including the neck, body and tail which appear similar to study from 02/12/2021. The pancreatic head mass is difficult to distinguish from these changes as mentioned on the previous exam. Spleen: Splenomegaly. Spleen measures 13 cm in cranial caudal dimension. Adrenals/Urinary Tract: Adrenal glands are unremarkable. Kidneys are normal, without renal calculi, focal lesion, or hydronephrosis. Bladder is unremarkable. Stomach/Bowel: Moderate distension of the gastric lumen. No signs of gastric outlet obstruction. Enteric contrast material is identified within the small bowel loops. There is mild wall thickening and luminal narrowing involving the terminal ileum without surrounding inflammatory changes, image 75/2. Vascular/Lymphatic: Aortic atherosclerosis. The extrahepatic portal vein and the portal venous confluence is encased and markedly narrowed by the head of pancreas mass as noted previously. Superior mesenteric vein remains patent. Extensive upper abdominal collateral vessels are identified. No enlarged abdominal or pelvic lymph nodes. Reproductive: Prostate is unremarkable. Other: Small volume of free fluid identified within the posterior pelvis, similar to the previous exam. No discrete fluid collections identified. Musculoskeletal: No acute  or significant osseous findings. IMPRESSION: 1. No significant change in the appearance of the pancreas when compared with 02/12/2021. As noted previously there are changes of acute pancreatitis superimposed on the locally advanced pancreatic neoplasm. 2. Dilated pancreatic duct with  multiple low-attenuation foci throughout the pancreatic parenchyma compatible with acute interstitial edematous pancreatitis with dilated side branches. 3. Encasement and marked narrowing of the extrahepatic portal vein and portal venous confluence with secondary collateral vessel formation throughout the upper abdomen. 4. Previously noted distinct focal areas of low attenuation within the liver are difficult to see on today's study and may have been areas of focal fatty deposition. Been 5. There is mild wall thickening and luminal narrowing involving the terminal ileum without surrounding inflammatory changes. Findings are nonspecific and may reflect sequelae of inflammation or infection versus incomplete distension. 6. Splenomegaly. 7. Stable appearance of lung nodules compatible with known metastatic disease from patient's primary pancreatic neoplasm. 8. Aortic Atherosclerosis (ICD10-I70.0). Electronically Signed   By: Kerby Moors M.D.   On: 02/21/2021 21:50        Principal Problem:   Pancreatitis Active Problems:   Essential hypertension   Mixed hyperlipidemia   Controlled type 2 diabetes mellitus without complication, without long-term current use of insulin (HCC)   Nausea without vomiting   Malignant neoplasm of pancreas (HCC)   Thrombocytopenia (HCC)   Postoperative hypothyroidism   Anemia associated with chemotherapy   Epigastric pain     LOS: 10 days   Timothy Page ,NP 02/22/2021, 9:04 AM

## 2021-02-22 NOTE — Progress Notes (Addendum)
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  CT abdomen/pelvis showed acute edematous pancreatitis.    Subjective   Patient says that he slept well last night.  Pain better controlled with OxyContin 20 mg every 12 hours.   Assessment/Plan:    Acute pancreatitis -Patient has history of stage IV pancreatic cancer with mets to liver and lungs -CT findings consistent with acute interstitial edematous pancreatitis -He was undergoing chemotherapy prior to admission -Pancreatitis is thought to be due to Xeloda -Oncology was consulted, Xeloda is being held -Oncology considering to transition him to 5-FU and Gemzar combination instead of Xeloda -Lipase is down to 20 -Diet was advanced to low-fat diet as per GI recommendation -Initially started on IV Dilaudid 2 mg every 3 hours as needed for pain -IV Dilaudid was discontinued and patient started on Dilaudid 2 mg every 4 hours as needed for pain -Also started on MS Contin 15 mg p.o. every 12 hours -As pain was not controlled so MS Contin was discontinued and patient started on OxyContin 15 mg every 12 hours -Continue Dilaudid 2 mg every 4 hours as needed -Patient feels little better with OxyContin, continue  OxyContin to 20 mg every 12 hour -GI was reconsulted, they recommended to obtain CT abdomen/pelvis with contrast -CT abdomen/pelvis still shows acute pancreatitis superimposed on pancreatic cancer as previous CT, no change noted.  Diabetes mellitus type 2 -Continue sliding scale insulin with NovoLog -CBG well controlled  Hypertension -Blood pressure stable -Dose of Norvasc increased to  10 mg daily -ACE inhibitor discontinued due to concern for pancreatitis  Hyperlipidemia -LDL 102, he was on statins which she stopped due to myalgias  Hypothyroidism -Continue Synthroid  Pancytopenia -Likely from recent chemotherapy -Improving  History portal vein thrombus -Continue Eliquis  Hypokalemia -Replace potassium and follow BMP in am   Medications     amLODipine  10 mg Oral Daily   apixaban  5 mg Oral BID   bisacodyl  10 mg Rectal Once   Chlorhexidine Gluconate Cloth  6 each Topical Daily   insulin aspart  0-9 Units Subcutaneous Q4H   levothyroxine  137 mcg Oral Q0600   lipase/protease/amylase  36,000 Units Oral TID AC   oxyCODONE  20 mg Oral Q12H   polyethylene glycol  17 g Oral Daily   potassium chloride  40 mEq Oral Once   prochlorperazine  10 mg Oral BID   sodium chloride flush  10-40 mL Intracatheter Q12H     Data Reviewed:   CBG:  Recent Labs  Lab 02/21/21 1708 02/21/21 1958 02/21/21 2350 02/22/21 0419 02/22/21 0723  GLUCAP 197* 151* 143* 158* 120*    SpO2: 97 %    Vitals:   02/20/21 1940 02/21/21 0321 02/21/21 2147 02/22/21 0552  BP: 128/82 133/90 (!) 151/91 109/77  Pulse: 84 79 87 72  Resp: 16 16 18 17   Temp: 98.2 F (36.8 C) 98.9 F (37.2 C) 98.1 F (36.7 C) 98.1 F (36.7 C)  TempSrc: Oral Oral Oral Oral  SpO2: 98% 97% 99% 97%  Weight:      Height:         Intake/Output Summary (Last 24 hours) at 02/22/2021 4193 Last data  filed at 02/21/2021 2200 Gross per 24 hour  Intake 130 ml  Output --  Net 130 ml    01/07 1901 - 01/09 0700 In: 130 [P.O.:120; I.V.:10] Out: -   Filed Weights   02/12/21 1633  Weight: 81.5 kg    Data Reviewed: Basic Metabolic Panel: Recent Labs  Lab 02/16/21 0424 02/17/21 0419 02/21/21 1600  NA 138 138 138  K 3.2* 3.6 3.4*  CL 108 107 103  CO2 23 21* 24  GLUCOSE 120* 121* 175*  BUN 6* 6* 9  CREATININE 0.83 0.79 0.87  CALCIUM 8.8* 8.7* 9.2  MG 1.8  --   --    Liver Function  Tests: Recent Labs  Lab 02/16/21 0424 02/17/21 0419  AST 12* 13*  ALT 11 10  ALKPHOS 71 68  BILITOT 0.8 0.9  PROT 6.2* 6.4*  ALBUMIN 3.5 3.4*   Recent Labs  Lab 02/16/21 0424 02/17/21 0419  LIPASE 20 20   No results for input(s): AMMONIA in the last 168 hours. CBC: Recent Labs  Lab 02/16/21 0424 02/17/21 0419 02/21/21 1530  WBC 2.7* 3.6* 3.7*  NEUTROABS  --   --  2.3  HGB 10.1* 10.3* 10.6*  HCT 29.2* 30.0* 31.3*  MCV 93.3 93.2 92.1  PLT 82* 112* 205   Cardiac Enzymes: No results for input(s): CKTOTAL, CKMB, CKMBINDEX, TROPONINI in the last 168 hours.  BNP (last 3 results) No results for input(s): BNP in the last 8760 hours.  ProBNP (last 3 results) No results for input(s): PROBNP in the last 8760 hours.  CBG: Recent Labs  Lab 02/21/21 1708 02/21/21 1958 02/21/21 2350 02/22/21 0419 02/22/21 0723  GLUCAP 197* 151* 143* 158* 120*       Radiology Reports  CT ABDOMEN PELVIS W CONTRAST  Result Date: 02/21/2021 CLINICAL DATA:  Evaluate for pancreatitis. History of pancreatic neoplasm. EXAM: CT ABDOMEN AND PELVIS WITH CONTRAST TECHNIQUE: Multidetector CT imaging of the abdomen and pelvis was performed using the standard protocol following bolus administration of intravenous contrast. CONTRAST:  56mL OMNIPAQUE IOHEXOL 350 MG/ML SOLN COMPARISON:  02/12/2021 FINDINGS: Lower chest: The imaged portions of the lung bases appear clear scratch scattered lung nodules are again noted bilaterally. The largest is in the left lower lobe measuring 7 mm, image 9/5. This is unchanged from 01/18/2021. These are compatible with known metastatic disease from patient's primary pancreatic neoplasm. Hepatobiliary: There is no suspicious liver lesion. Previously noted areas of hypo attenuation are not seen on today's study. There is moderate diffuse distension of the gallbladder which appears filled with sludge. Mild central intrahepatic bile duct dilatation. Common bile duct appears  nondilated. Pancreas: No significant change in the appearance of the pancreas when compared with 02/12/2021. The pancreas appears diffusely edematous with diffuse peripancreatic inflammatory fat stranding. Numerous low-attenuation foci are again noted throughout the pancreas including the neck, body and tail which appear similar to study from 02/12/2021. The pancreatic head mass is difficult to distinguish from these changes as mentioned on the previous exam. Spleen: Splenomegaly. Spleen measures 13 cm in cranial caudal dimension. Adrenals/Urinary Tract: Adrenal glands are unremarkable. Kidneys are normal, without renal calculi, focal lesion, or hydronephrosis. Bladder is unremarkable. Stomach/Bowel: Moderate distension of the gastric lumen. No signs of gastric outlet obstruction. Enteric contrast material is identified within the small bowel loops. There is mild wall thickening and luminal narrowing involving the terminal ileum without surrounding inflammatory changes, image 75/2. Vascular/Lymphatic: Aortic atherosclerosis. The extrahepatic portal vein and the portal venous confluence is encased  and markedly narrowed by the head of pancreas mass as noted previously. Superior mesenteric vein remains patent. Extensive upper abdominal collateral vessels are identified. No enlarged abdominal or pelvic lymph nodes. Reproductive: Prostate is unremarkable. Other: Small volume of free fluid identified within the posterior pelvis, similar to the previous exam. No discrete fluid collections identified. Musculoskeletal: No acute or significant osseous findings. IMPRESSION: 1. No significant change in the appearance of the pancreas when compared with 02/12/2021. As noted previously there are changes of acute pancreatitis superimposed on the locally advanced pancreatic neoplasm. 2. Dilated pancreatic duct with multiple low-attenuation foci throughout the pancreatic parenchyma compatible with acute interstitial edematous  pancreatitis with dilated side branches. 3. Encasement and marked narrowing of the extrahepatic portal vein and portal venous confluence with secondary collateral vessel formation throughout the upper abdomen. 4. Previously noted distinct focal areas of low attenuation within the liver are difficult to see on today's study and may have been areas of focal fatty deposition. Been 5. There is mild wall thickening and luminal narrowing involving the terminal ileum without surrounding inflammatory changes. Findings are nonspecific and may reflect sequelae of inflammation or infection versus incomplete distension. 6. Splenomegaly. 7. Stable appearance of lung nodules compatible with known metastatic disease from patient's primary pancreatic neoplasm. 8. Aortic Atherosclerosis (ICD10-I70.0). Electronically Signed   By: Kerby Moors M.D.   On: 02/21/2021 21:50       Antibiotics: Anti-infectives (From admission, onward)    None         DVT prophylaxis: Apixaban  Code Status: DNR  Family Communication: No family at bedside   Consultants: Oncology Gastroenterology  Procedures:     Objective    Physical Examination:  General-appears in no acute distress Heart-S1-S2, regular, no murmur auscultated Lungs-clear to auscultation bilaterally, no wheezing or crackles auscultated Abdomen-soft, mild tenderness in left upper quadrant , no organomegaly Extremities-no edema in the lower extremities Neuro-alert, oriented x3, no focal deficit noted  Status is: Inpatient  Dispo: The patient is from: Home              Anticipated d/c is to: Home              Anticipated d/c date is: 02/22/2021              Patient currently not stable for discharge  Barrier to discharge-ongoing management for pain control for pancreatitis  COVID-19 Labs  No results for input(s): DDIMER, FERRITIN, LDH, CRP in the last 72 hours.  Lab Results  Component Value Date   [place] NEGATIVE 02/12/2021    [place] NEGATIVE 07/13/2020   [place] NEGATIVE 06/15/2020   SARSCOV2NAA RESULT: NEGATIVE 01/22/2020            Recent Results (from the past 240 hour(s))  Resp Panel by RT-PCR (Flu A&B, Covid) Nasopharyngeal Swab     Status: None   Collection Time: 02/12/21  5:49 PM   Specimen: Nasopharyngeal Swab; Nasopharyngeal(NP) swabs in vial transport medium  Result Value Ref Range Status   SARS Coronavirus 2 by RT PCR NEGATIVE NEGATIVE Final    Comment: (NOTE) SARS-CoV-2 target nucleic acids are NOT DETECTED.  The SARS-CoV-2 RNA is generally detectable in upper respiratory specimens during the acute phase of infection. The lowest concentration of SARS-CoV-2 viral copies this assay can detect is 138 copies/mL. A negative result does not preclude SARS-Cov-2 infection and should not be used as the sole basis for treatment or other patient management decisions. A negative result may occur with  improper  specimen collection/handling, submission of specimen other than nasopharyngeal swab, presence of viral mutation(s) within the areas targeted by this assay, and inadequate number of viral copies(<138 copies/mL). A negative result must be combined with clinical observations, patient history, and epidemiological information. The expected result is Negative.  Fact Sheet for Patients:  EntrepreneurPulse.com.au  Fact Sheet for Healthcare Providers:  IncredibleEmployment.be  This test is no t yet approved or cleared by the [place] FDA and  has been authorized for detection and/or diagnosis of SARS-CoV-2 by FDA under an Emergency Use Authorization (EUA). This EUA will remain  in effect (meaning this test can be used) for the duration of the COVID-19 declaration under Section 564(b)(1) of the Act, 21 U.S.C.section 360bbb-3(b)(1), unless the authorization is terminated  or revoked sooner.       Influenza A by PCR NEGATIVE NEGATIVE Final    Influenza B by PCR NEGATIVE NEGATIVE Final    Comment: (NOTE) The Xpert Xpress SARS-CoV-2/FLU/RSV plus assay is intended as an aid in the diagnosis of influenza from Nasopharyngeal swab specimens and should not be used as a sole basis for treatment. Nasal washings and aspirates are unacceptable for Xpert Xpress SARS-CoV-2/FLU/RSV testing.  Fact Sheet for Patients: EntrepreneurPulse.com.au  Fact Sheet for Healthcare Providers: IncredibleEmployment.be  This test is not yet approved or cleared by the [place] FDA and has been authorized for detection and/or diagnosis of SARS-CoV-2 by FDA under an Emergency Use Authorization (EUA). This EUA will remain in effect (meaning this test can be used) for the duration of the COVID-19 declaration under Section 564(b)(1) of the Act, 21 U.S.C. section 360bbb-3(b)(1), unless the authorization is terminated or revoked.  Performed at [facility], [place] [address], [place], [place] [address]     Oswald Hillock   [facility] If 7PM-7AM, please contact night-coverage at [email or web address], Office  [phone]   02/22/2021, 8:32 AM  LOS: 10 days

## 2021-02-23 DIAGNOSIS — D61818 Other pancytopenia: Secondary | ICD-10-CM

## 2021-02-23 DIAGNOSIS — E440 Moderate protein-calorie malnutrition: Secondary | ICD-10-CM | POA: Insufficient documentation

## 2021-02-23 LAB — CBC WITH DIFFERENTIAL/PLATELET
Abs Immature Granulocytes: 0.02 10*3/uL (ref 0.00–0.07)
Basophils Absolute: 0 10*3/uL (ref 0.0–0.1)
Basophils Relative: 1 %
Eosinophils Absolute: 0.1 10*3/uL (ref 0.0–0.5)
Eosinophils Relative: 3 %
HCT: 32.7 % — ABNORMAL LOW (ref 39.0–52.0)
Hemoglobin: 11.1 g/dL — ABNORMAL LOW (ref 13.0–17.0)
Immature Granulocytes: 1 %
Lymphocytes Relative: 17 %
Lymphs Abs: 0.8 10*3/uL (ref 0.7–4.0)
MCH: 31.2 pg (ref 26.0–34.0)
MCHC: 33.9 g/dL (ref 30.0–36.0)
MCV: 91.9 fL (ref 80.0–100.0)
Monocytes Absolute: 0.5 10*3/uL (ref 0.1–1.0)
Monocytes Relative: 10 %
Neutro Abs: 3 10*3/uL (ref 1.7–7.7)
Neutrophils Relative %: 68 %
Platelets: 246 10*3/uL (ref 150–400)
RBC: 3.56 MIL/uL — ABNORMAL LOW (ref 4.22–5.81)
RDW: 13.5 % (ref 11.5–15.5)
WBC: 4.4 10*3/uL (ref 4.0–10.5)
nRBC: 0 % (ref 0.0–0.2)

## 2021-02-23 LAB — GLUCOSE, CAPILLARY
Glucose-Capillary: 136 mg/dL — ABNORMAL HIGH (ref 70–99)
Glucose-Capillary: 147 mg/dL — ABNORMAL HIGH (ref 70–99)
Glucose-Capillary: 151 mg/dL — ABNORMAL HIGH (ref 70–99)
Glucose-Capillary: 153 mg/dL — ABNORMAL HIGH (ref 70–99)
Glucose-Capillary: 162 mg/dL — ABNORMAL HIGH (ref 70–99)
Glucose-Capillary: 162 mg/dL — ABNORMAL HIGH (ref 70–99)
Glucose-Capillary: 196 mg/dL — ABNORMAL HIGH (ref 70–99)

## 2021-02-23 LAB — BASIC METABOLIC PANEL
Anion gap: 10 (ref 5–15)
BUN: 11 mg/dL (ref 8–23)
CO2: 26 mmol/L (ref 22–32)
Calcium: 8.7 mg/dL — ABNORMAL LOW (ref 8.9–10.3)
Chloride: 103 mmol/L (ref 98–111)
Creatinine, Ser: 0.64 mg/dL (ref 0.61–1.24)
GFR, Estimated: >60 mL/min (ref >60)
Glucose, Bld: 174 mg/dL — ABNORMAL HIGH (ref 70–99)
Potassium: 3.7 mmol/L (ref 3.5–5.1)
Sodium: 139 mmol/L (ref 135–145)

## 2021-02-23 MED ORDER — SORBITOL 70 % SOLN
960.0000 mL | TOPICAL_OIL | Freq: Once | ORAL | Status: DC
  Filled 2021-02-23: qty 473

## 2021-02-23 MED ORDER — POLYETHYLENE GLYCOL 3350 17 G PO PACK
17.0000 g | PACK | Freq: Two times a day (BID) | ORAL | Status: DC
  Filled 2021-02-23: qty 1

## 2021-02-23 MED ORDER — SORBITOL 70 % SOLN
30.0000 mL | Status: AC
  Administered 2021-02-23 (×2): 30 mL via ORAL
  Filled 2021-02-23 (×2): qty 30

## 2021-02-23 MED ORDER — HYDROMORPHONE HCL 2 MG PO TABS
3.0000 mg | ORAL_TABLET | ORAL | Status: DC | PRN
  Administered 2021-02-23 – 2021-02-25 (×9): 3 mg via ORAL
  Filled 2021-02-23 (×9): qty 2

## 2021-02-23 MED ORDER — OXYCODONE HCL ER 20 MG PO T12A
30.0000 mg | EXTENDED_RELEASE_TABLET | Freq: Two times a day (BID) | ORAL | Status: DC
  Administered 2021-02-23 – 2021-02-25 (×4): 30 mg via ORAL
  Filled 2021-02-23 (×4): qty 1

## 2021-02-23 MED ORDER — INFLUENZA VAC SPLIT QUAD 0.5 ML IM SUSY
0.5000 mL | PREFILLED_SYRINGE | INTRAMUSCULAR | Status: AC
Start: 2021-02-24 — End: 2021-02-24
  Administered 2021-02-24: 0.5 mL via INTRAMUSCULAR
  Filled 2021-02-23: qty 0.5

## 2021-02-23 MED ORDER — LACTULOSE 10 GM/15ML PO SOLN
20.0000 g | Freq: Two times a day (BID) | ORAL | Status: DC | PRN
  Filled 2021-02-23: qty 30

## 2021-02-23 MED ORDER — INSULIN ASPART 100 UNIT/ML IJ SOLN
0.0000 [IU] | Freq: Three times a day (TID) | INTRAMUSCULAR | Status: DC
  Administered 2021-02-24: 2 [IU] via SUBCUTANEOUS
  Administered 2021-02-24: 3 [IU] via SUBCUTANEOUS
  Administered 2021-02-24: 2 [IU] via SUBCUTANEOUS
  Administered 2021-02-25: 3 [IU] via SUBCUTANEOUS
  Administered 2021-02-25: 2 [IU] via SUBCUTANEOUS

## 2021-02-23 MED ORDER — ADULT MULTIVITAMIN W/MINERALS CH
1.0000 | ORAL_TABLET | Freq: Every day | ORAL | Status: DC
  Administered 2021-02-23 – 2021-02-25 (×3): 1 via ORAL
  Filled 2021-02-23 (×3): qty 1

## 2021-02-23 MED ORDER — SORBITOL 70 % SOLN
30.0000 mL | Freq: Once | Status: DC
  Filled 2021-02-23: qty 30

## 2021-02-23 MED ORDER — ENSURE MAX PROTEIN PO LIQD
11.0000 [oz_av] | Freq: Two times a day (BID) | ORAL | Status: DC
  Administered 2021-02-23 – 2021-02-25 (×4): 11 [oz_av] via ORAL
  Filled 2021-02-23 (×4): qty 330

## 2021-02-23 NOTE — Progress Notes (Signed)
Initial Nutrition Assessment  DOCUMENTATION CODES:   Non-severe (moderate) malnutrition in context of chronic illness  INTERVENTION:   -Ensure MAX Protein po BID, each supplement provides 150 kcal and 30 grams of protein   -Multivitamin with minerals daily  NUTRITION DIAGNOSIS:   Moderate Malnutrition related to chronic illness, cancer and cancer related treatments as evidenced by moderate fat depletion, moderate muscle depletion, percent weight loss, energy intake < or equal to 75% for > or equal to 1 month.  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  CT abdomen/pelvis showed acute edematous pancreatitis.  Patient in room with wife at bedside. Pt reports eating some cereal with milk this morning for breakfast. No lunch today as he does not feel hungry. Has another box of cereal in which he plans to snack on. Pt denies issues with swallowing or chewing at this time, just no appetite. Pt was followed by [place] RD, last seen July 2022. At that time pt was having some improvement at that time and was eating better. Per pt's wife, pt was having poor PO especially in December 2022.  Pt states he is able to drink fluids, will order Ensure Max. States he is constipated which is not helping his appetite. Has been on a laxative regimen.  Per weight records, pt has lost 15 lbs since 01/05/21 (7% wt loss x 1.5 months ,significant for time frame).   Medications: Dulcolax, Creon, Miralax, Compazine  Labs reviewed:  CBGs: 136-196  NUTRITION - FOCUSED  PHYSICAL EXAM:  Flowsheet Row Most Recent Value  Orbital Region Mild depletion  Upper Arm Region Moderate depletion  Thoracic and Lumbar Region Unable to assess  Buccal Region Moderate depletion  Temple Region Moderate depletion  Clavicle Bone Region Moderate depletion  Clavicle and Acromion Bone Region Moderate depletion  Scapular Bone Region Moderate depletion  Dorsal Hand No depletion  Patellar Region Unable to assess  Anterior Thigh Region Unable to assess  Posterior Calf Region Unable to assess  Edema (RD Assessment) None       Diet Order:   Diet Order             Diet Heart Room service appropriate? Yes; Fluid consistency: Thin  Diet effective now                   EDUCATION NEEDS:   No education needs have been identified at this time  Skin:  Skin Assessment: Reviewed RN Assessment  Last BM:  1/7  Height:   Ht Readings from Last 1 Encounters:  02/12/21 5\' 9"  (1.753 m)    Weight:   Wt Readings from Last 1 Encounters:  02/12/21 81.5 kg    BMI:  Body mass index is 26.54 kg/m.  Estimated Nutritional Needs:   Kcal:  2400-2500  Protein:  115-125g  Fluid:  2L/day  Clayton Bibles, MS, RD, LDN Inpatient Clinical Dietitian Contact information available via Amion

## 2021-02-23 NOTE — Progress Notes (Signed)
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  CT abdomen/pelvis showed acute edematous pancreatitis.  Patient treated for acute pancreatitis, GI and oncology consulted and were following.  Patient with ongoing abdominal pain and pain medications being adjusted for better pain control.   Assessment & Plan:   Principal Problem:   Pancreatitis Active Problems:   Essential hypertension   Mixed hyperlipidemia   Controlled type 2 diabetes mellitus without complication, without long-term current use of insulin (HCC)   Nausea without vomiting   Malignant neoplasm of pancreas (HCC)   Drug-induced constipation   Thrombocytopenia (HCC)   Postoperative hypothyroidism   Anemia associated with chemotherapy   Epigastric pain   Malnutrition of moderate degree   Pancytopenia (HCC)  #1 acute pancreatitis -Patient with history of stage IV pancreatic cancer with metastases to the liver and lungs. -CT abdomen and pelvis done consistent with acute edematous pancreatitis. -Patient noted to be on chemotherapy prior to admission and felt pancreatitis likely secondary to Xeloda. -Patient seen in consultation by oncology Xeloda on hold and oncology considering transitioning patient to 5-FU and Gemzar combination instead of Xeloda. -Lipase levels trended down. -Patient seen in consultation by GI who recommended repeat abdominal CT which still showed acute pancreatitis superimposed on pancreatic cancer as prior CT with no significant change. -Currently tolerating a low-fat diet. -Initially was on IV Dilaudid 2  mg every 3 hours as needed for pain, IV Dilaudid discontinued patient is on oral Dilaudid 2 mg every 4 hours as needed for pain which she states not managing it as well as the IV Dilaudid. -Patient noted to be on MS Contin 50 mg p.o. every 12 hours and as pain was not controlled MS Contin discontinued patient placed on OxyContin 15 mg every 12 hours and dose increased to 20 mg every 12 hours for better pain control. -Patient still with ongoing pain although some improvement when started on OxyContin and as such we will increase OxyContin to 30 mg p.o. twice daily and increase Dilaudid to 3 mg p.o. every 4 hours as needed pain. -Continue pancreatic enzymes. -Consulted palliative care for symptomatic management/pain management. -Continue current diet.  2.  Diabetes mellitus type 2 -Hemoglobin A1c 7.4 (02/12/2021) -CBG 147 this morning -SSI. -Change CBGs to [facility] S.  3.  Hypertension -Norvasc. -ACE inhibitor discontinued due to concern for pancreatitis.  4.  Hypothyroidism -Synthroid.  5.  Hyperlipidemia -LDL noted at 102. -Was on a statin which was discontinued due to myalgias.  6.  Pancytopenia -Secondary to recent chemotherapy. -Improving. -No overt bleeding.  7.  History of portal vein thrombosis -Eliquis.  8.  Hypokalemia -Repleted.  9.  Constipation -Patient states has not had a bowel movement in 5 days. -Increase MiraLAX to twice daily. -Continue Dulcolax. -Sorbitol p.o. every 2 hours x2 doses.  10.  Metastatic pancreatic cancer -Was on Xeloda however due to concerns for Xeloda induced pancreatitis Xeloda currently on hold. -Oncology considering transitioning patient to 5-FU and Gemzar combination. -Outpatient follow-up with oncology.   DVT prophylaxis: Eliquis Code Status: DNR Family Communication: Updated patient.  Updated wife at bedside. Disposition:  Status is: Inpatient  Remains inpatient appropriate because: Severity of illness       Consultants:   Oncology: Dr.Iruku 02/16/2021 Gastroenterology: Dr. Fuller Plan 02/15/2021  Procedures:  CT abdomen and pelvis 02/12/2021, 02/21/2021  Antimicrobials:  Anti-infectives (From admission, onward)    None         Subjective: Laying in bed.  Wife at bedside.  Denies any chest pain.  No shortness of breath.  Still with abdominal pain.  States some improvement with abdominal pain since changing to oxycodone however still with significant abdominal pain which is usually worse between the hours of 2 AM to 5 AM.  Tolerating current diet.  Complain of constipation no bowel movement in 5 days.  Objective: Vitals:   02/22/21 0552 02/22/21 1955 02/23/21 0357 02/23/21 1205  BP: 109/77 136/81 131/88 124/84  Pulse: 72 82 79 77  Resp: 17 18 16 18   Temp: 98.1 F (36.7 C) 98.5 F (36.9 C) 98.2 F (36.8 C) 97.7 F (36.5 C)  TempSrc: Oral Oral Oral Oral  SpO2: 97% 99% 97% 98%  Weight:      Height:        Intake/Output Summary (Last 24 hours) at 02/23/2021 1841 Last data filed at 02/23/2021 1300 Gross per 24 hour  Intake 370 ml  Output --  Net 370 ml   Filed Weights   02/12/21 1633  Weight: 81.5 kg    Examination:  General exam: Appears calm and comfortable  Respiratory system: Clear to auscultation.  No wheezes, no crackles, no rhonchi respiratory effort normal. Cardiovascular system: S1 & S2 heard, RRR. No JVD, murmurs, rubs, gallops or clicks. No pedal edema. Gastrointestinal system: Abdomen is nondistended, soft tender to palpation mid and lower abdominal region.  Positive bowel sounds.  No rebound.  No guarding.   Central nervous system: Alert and oriented. No focal neurological deficits. Extremities: Symmetric 5 x 5 power. Skin: No rashes, lesions or ulcers Psychiatry: Judgement and insight appear normal. Mood & affect appropriate.     Data Reviewed: I have personally reviewed following labs and imaging studies  CBC: Recent Labs  Lab 02/17/21 0419 02/21/21 1530 02/23/21 1209   WBC 3.6* 3.7* 4.4  NEUTROABS  --  2.3 3.0  HGB 10.3* 10.6* 11.1*  HCT 30.0* 31.3* 32.7*  MCV 93.2 92.1 91.9  PLT 112* 205 315    Basic Metabolic Panel: Recent Labs  Lab 02/17/21 0419 02/21/21 1600 02/23/21 1209  NA 138 138 139  K 3.6 3.4* 3.7  CL 107 103 103  CO2 21* 24 26  GLUCOSE 121* 175* 174*  BUN 6* 9 11  CREATININE 0.79 0.87 0.64  CALCIUM 8.7* 9.2 8.7*    GFR: Estimated Creatinine Clearance: 93.3 mL/min (by C-G formula based on SCr of 0.64 mg/dL).  Liver Function Tests: Recent Labs  Lab 02/17/21 0419  AST 13*  ALT 10  ALKPHOS 68  BILITOT 0.9  PROT 6.4*  ALBUMIN 3.4*    CBG: Recent Labs  Lab 02/22/21 2359 02/23/21 0356 02/23/21 0749 02/23/21 1203 02/23/21 1620  GLUCAP 196* 136* 147* 162* 162*     No results found for this or any previous visit (from the past 240 hour(s)).       Radiology Studies: CT ABDOMEN PELVIS W CONTRAST  Result Date: 02/21/2021 CLINICAL DATA:  Evaluate for pancreatitis. History of pancreatic neoplasm. EXAM: CT ABDOMEN AND PELVIS WITH CONTRAST TECHNIQUE: Multidetector CT imaging of the abdomen and pelvis was performed using the standard protocol following bolus administration of intravenous  contrast. CONTRAST:  80mL OMNIPAQUE IOHEXOL 350 MG/ML SOLN COMPARISON:  02/12/2021 FINDINGS: Lower chest: The imaged portions of the lung bases appear clear scratch scattered lung nodules are again noted bilaterally. The largest is in the left lower lobe measuring 7 mm, image 9/5. This is unchanged from 01/18/2021. These are compatible with known metastatic disease from patient's primary pancreatic neoplasm. Hepatobiliary: There is no suspicious liver lesion. Previously noted areas of hypo attenuation are not seen on today's study. There is moderate diffuse distension of the gallbladder which appears filled with sludge. Mild central intrahepatic bile duct dilatation. Common bile duct appears nondilated. Pancreas: No significant change in the  appearance of the pancreas when compared with 02/12/2021. The pancreas appears diffusely edematous with diffuse peripancreatic inflammatory fat stranding. Numerous low-attenuation foci are again noted throughout the pancreas including the neck, body and tail which appear similar to study from 02/12/2021. The pancreatic head mass is difficult to distinguish from these changes as mentioned on the previous exam. Spleen: Splenomegaly. Spleen measures 13 cm in cranial caudal dimension. Adrenals/Urinary Tract: Adrenal glands are unremarkable. Kidneys are normal, without renal calculi, focal lesion, or hydronephrosis. Bladder is unremarkable. Stomach/Bowel: Moderate distension of the gastric lumen. No signs of gastric outlet obstruction. Enteric contrast material is identified within the small bowel loops. There is mild wall thickening and luminal narrowing involving the terminal ileum without surrounding inflammatory changes, image 75/2. Vascular/Lymphatic: Aortic atherosclerosis. The extrahepatic portal vein and the portal venous confluence is encased and markedly narrowed by the head of pancreas mass as noted previously. Superior mesenteric vein remains patent. Extensive upper abdominal collateral vessels are identified. No enlarged abdominal or pelvic lymph nodes. Reproductive: Prostate is unremarkable. Other: Small volume of free fluid identified within the posterior pelvis, similar to the previous exam. No discrete fluid collections identified. Musculoskeletal: No acute or significant osseous findings. IMPRESSION: 1. No significant change in the appearance of the pancreas when compared with 02/12/2021. As noted previously there are changes of acute pancreatitis superimposed on the locally advanced pancreatic neoplasm. 2. Dilated pancreatic duct with multiple low-attenuation foci throughout the pancreatic parenchyma compatible with acute interstitial edematous pancreatitis with dilated side branches. 3. Encasement and  marked narrowing of the extrahepatic portal vein and portal venous confluence with secondary collateral vessel formation throughout the upper abdomen. 4. Previously noted distinct focal areas of low attenuation within the liver are difficult to see on today's study and may have been areas of focal fatty deposition. Been 5. There is mild wall thickening and luminal narrowing involving the terminal ileum without surrounding inflammatory changes. Findings are nonspecific and may reflect sequelae of inflammation or infection versus incomplete distension. 6. Splenomegaly. 7. Stable appearance of lung nodules compatible with known metastatic disease from patient's primary pancreatic neoplasm. 8. Aortic Atherosclerosis (ICD10-I70.0). Electronically Signed   By: Kerby Moors M.D.   On: 02/21/2021 21:50        Scheduled Meds:  amLODipine  10 mg Oral Daily   apixaban  5 mg Oral BID   bisacodyl  10 mg Oral Daily   bisacodyl  10 mg Rectal Once   Chlorhexidine Gluconate Cloth  6 each Topical Daily   [START ON 02/24/2021] insulin aspart  0-9 Units Subcutaneous TID WC   levothyroxine  137 mcg Oral Q0600   lipase/protease/amylase  36,000 Units Oral TID AC   multivitamin with minerals  1 tablet Oral Daily   oxyCODONE  30 mg Oral Q12H   polyethylene glycol  17 g Oral BID   prochlorperazine  10 mg Oral BID   Ensure Max Protein  11 oz Oral BID   sodium chloride flush  10-40 mL Intracatheter Q12H   Continuous Infusions:   LOS: 11 days    Time spent: 40 minutes    Irine Seal, MD [facility]   To contact the attending provider between 7A-7P or the covering provider during after hours 7P-7A, please log into the web site [email or web address] and access using universal [place] password for that web site. If you do not have the password, please call the hospital operator.  02/23/2021, 6:41 PM

## 2021-02-24 ENCOUNTER — Inpatient Hospital Stay: Payer: 59

## 2021-02-24 DIAGNOSIS — Z7189 Other specified counseling: Secondary | ICD-10-CM

## 2021-02-24 DIAGNOSIS — Z515 Encounter for palliative care: Secondary | ICD-10-CM

## 2021-02-24 DIAGNOSIS — G893 Neoplasm related pain (acute) (chronic): Secondary | ICD-10-CM

## 2021-02-24 DIAGNOSIS — Z66 Do not resuscitate: Secondary | ICD-10-CM

## 2021-02-24 LAB — BASIC METABOLIC PANEL
Anion gap: 11 (ref 5–15)
BUN: 13 mg/dL (ref 8–23)
CO2: 26 mmol/L (ref 22–32)
Calcium: 9.2 mg/dL (ref 8.9–10.3)
Chloride: 101 mmol/L (ref 98–111)
Creatinine, Ser: 0.74 mg/dL (ref 0.61–1.24)
GFR, Estimated: >60 mL/min (ref >60)
Glucose, Bld: 170 mg/dL — ABNORMAL HIGH (ref 70–99)
Potassium: 3.4 mmol/L — ABNORMAL LOW (ref 3.5–5.1)
Sodium: 138 mmol/L (ref 135–145)

## 2021-02-24 LAB — CBC WITH DIFFERENTIAL/PLATELET
Abs Immature Granulocytes: 0.01 10*3/uL (ref 0.00–0.07)
Basophils Absolute: 0 10*3/uL (ref 0.0–0.1)
Basophils Relative: 1 %
Eosinophils Absolute: 0.2 10*3/uL (ref 0.0–0.5)
Eosinophils Relative: 6 %
HCT: 32.9 % — ABNORMAL LOW (ref 39.0–52.0)
Hemoglobin: 11.2 g/dL — ABNORMAL LOW (ref 13.0–17.0)
Immature Granulocytes: 0 %
Lymphocytes Relative: 29 %
Lymphs Abs: 1.1 10*3/uL (ref 0.7–4.0)
MCH: 31.7 pg (ref 26.0–34.0)
MCHC: 34 g/dL (ref 30.0–36.0)
MCV: 93.2 fL (ref 80.0–100.0)
Monocytes Absolute: 0.5 10*3/uL (ref 0.1–1.0)
Monocytes Relative: 14 %
Neutro Abs: 1.9 10*3/uL (ref 1.7–7.7)
Neutrophils Relative %: 50 %
Platelets: 241 10*3/uL (ref 150–400)
RBC: 3.53 MIL/uL — ABNORMAL LOW (ref 4.22–5.81)
RDW: 13.7 % (ref 11.5–15.5)
WBC: 3.9 10*3/uL — ABNORMAL LOW (ref 4.0–10.5)
nRBC: 0 % (ref 0.0–0.2)

## 2021-02-24 LAB — GLUCOSE, CAPILLARY
Glucose-Capillary: 154 mg/dL — ABNORMAL HIGH (ref 70–99)
Glucose-Capillary: 164 mg/dL — ABNORMAL HIGH (ref 70–99)
Glucose-Capillary: 232 mg/dL — ABNORMAL HIGH (ref 70–99)
Glucose-Capillary: 173 mg/dL — ABNORMAL HIGH (ref 70–99)
Glucose-Capillary: 150 mg/dL — ABNORMAL HIGH (ref 70–99)

## 2021-02-24 LAB — MAGNESIUM: Magnesium: 2 mg/dL (ref 1.7–2.4)

## 2021-02-24 MED ORDER — GABAPENTIN 300 MG PO CAPS
300.0000 mg | ORAL_CAPSULE | Freq: Every day | ORAL | Status: DC
  Administered 2021-02-24: 300 mg via ORAL
  Filled 2021-02-24: qty 1

## 2021-02-24 MED ORDER — POTASSIUM CHLORIDE CRYS ER 20 MEQ PO TBCR
40.0000 meq | EXTENDED_RELEASE_TABLET | Freq: Two times a day (BID) | ORAL | Status: AC
  Administered 2021-02-24 (×2): 40 meq via ORAL
  Filled 2021-02-24 (×2): qty 2

## 2021-02-24 MED ORDER — POLYETHYLENE GLYCOL 3350 17 G PO PACK
17.0000 g | PACK | Freq: Every day | ORAL | Status: DC | PRN
  Administered 2021-02-25: 17 g via ORAL
  Filled 2021-02-24: qty 1

## 2021-02-24 NOTE — Plan of Care (Signed)
Pt has had success with bowel regimen overnight. Pain is better controlled at this time. Problem: Education: Goal: Knowledge of General Education information will improve Description: Including pain rating scale, medication(s)/side effects and non-pharmacologic comfort measures Outcome: Progressing   Problem: Health Behavior/Discharge Planning: Goal: Ability to manage health-related needs will improve Outcome: Progressing   Problem: Clinical Measurements: Goal: Ability to maintain clinical measurements within normal limits will improve Outcome: Progressing Goal: Will remain free from infection Outcome: Progressing Goal: Diagnostic test results will improve Outcome: Progressing Goal: Respiratory complications will improve Outcome: Progressing Goal: Cardiovascular complication will be avoided Outcome: Progressing   Problem: Activity: Goal: Risk for activity intolerance will decrease Outcome: Progressing   Problem: Nutrition: Goal: Adequate nutrition will be maintained Outcome: Progressing   Problem: Coping: Goal: Level of anxiety will decrease Outcome: Progressing   Problem: Elimination: Goal: Will not experience complications related to bowel motility Outcome: Progressing Goal: Will not experience complications related to urinary retention Outcome: Progressing   Problem: Pain Managment: Goal: General experience of comfort will improve Outcome: Progressing   Problem: Safety: Goal: Ability to remain free from injury will improve Outcome: Progressing   Problem: Skin Integrity: Goal: Risk for impaired skin integrity will decrease Outcome: Progressing   Problem: Education: Goal: Knowledge of General Education information will improve Description: Including pain rating scale, medication(s)/side effects and non-pharmacologic comfort measures Outcome: Progressing   Problem: Health Behavior/Discharge Planning: Goal: Ability to manage health-related needs will  improve Outcome: Progressing   Problem: Clinical Measurements: Goal: Ability to maintain clinical measurements within normal limits will improve Outcome: Progressing Goal: Will remain free from infection Outcome: Progressing Goal: Diagnostic test results will improve Outcome: Progressing Goal: Respiratory complications will improve Outcome: Progressing Goal: Cardiovascular complication will be avoided Outcome: Progressing   Problem: Activity: Goal: Risk for activity intolerance will decrease Outcome: Progressing   Problem: Coping: Goal: Level of anxiety will decrease Outcome: Progressing   Problem: Pain Managment: Goal: General experience of comfort will improve Outcome: Progressing   Problem: Safety: Goal: Ability to remain free from injury will improve Outcome: Progressing

## 2021-02-24 NOTE — Progress Notes (Signed)
[facility] PROGRESS NOTE    Progress Note  Timothy Page  ZOX:[id number] DOB: [birth year] DOA: 02/12/2021 PCP: [name], MD     Brief Narrative:   Timothy Page is an 66 y.o. male past medical history of stage IV pancreatic cancer with metastasis to the liver and lung diabetes mellitus type 2 essential hypertension portal vein thrombosis on Eliquis presented to the ED with nausea vomiting and abdominal pain, CT scan of the abdomen pelvis showed acute edematous pancreatitis, GI and oncology were consulted    Assessment/Plan:   Acute pancreatitis: In the setting of stage IV metastatic pancreatic cancer to the liver and lung.  Patient on Xeloda prior to admission CT scan of the abdomen showed acute edematous pancreatitis, pancreatitis felt to be due to Xeloda. Which was held by oncology and considering transitioning her as an outpatient to 5-FU and Gemzar combination Lipase is trending down GI recommended follow-up CT that showed persistent acute pancreatitis superimposed on pancreatic cancer. Tolerating her diet, currently oxycodone every 12. Poor prognosis Perative care was consulted further management per them.  Diabetes mellitus type 2: With an A1c of 7.4, continue sliding scale insulin.  Essential hypertension: On Norvasc, ACE inhibitor discontinued due to concern for acute pancreatitis.  Hypothyroidism: Continue Synthroid.  Hyperlipidemia: Discontinued due to myalgias.  Pancytopenia secondary to antineoplastic drugs: No signs of overt bleeding seems to be improving slowly.  History of portal vein thrombosis: Continue Eliquis.  Hypokalemia: Repleted now resolved.  Constipation: Likely due to narcotics that are MiraLAX twice a day, she also given 2 doses of sorbitol.  Metastatic pancreatic cancer: Oncology considering transitioning the patient from 5-FU and Gemzar combination as an outpatient.  DVT prophylaxis: eliquis Family  Communication:wife Status is: Inpatient  Remains inpatient appropriate because: Constipation improved now having 6-10 bowel movements in the last 24 hours.        Code Status:     Code Status Orders  (From admission, onward)           Start     Ordered   02/13/21 0356  Do not attempt resuscitation (DNR)  Continuous       Question Answer Comment  In the event of cardiac or respiratory ARREST Do not call a code blue   In the event of cardiac or respiratory ARREST Do not perform Intubation, CPR, defibrillation or ACLS   In the event of cardiac or respiratory ARREST Use medication by any route, position, wound care, and other measures to relive pain and suffering. May use oxygen, suction and manual treatment of airway obstruction as needed for comfort.      02/13/21 0356           Code Status History     Date Active Date Inactive Code Status Order ID Comments User Context   02/12/2021 2145 02/13/2021 0356 DNR [id number]  Toy Baker, MD ED   02/12/2021 2143 02/12/2021 2144 Full Code [id number]  Toy Baker, MD ED   07/13/2020 2252 07/17/2020 1512 DNR [id number]  Shela Leff, MD ED   07/13/2020 2228 07/13/2020 2252 Full Code [id number]  Shela Leff, MD ED   06/15/2020 0319 06/22/2020 1646 Full Code [id number]  Quintella Baton, MD Inpatient   05/11/2017 0346 05/13/2017 1500 Full Code [id number]  Vianne Bulls, MD ED   05/07/2017 1456 05/09/2017 1330 Full Code [id number]  Velna Hatchet, MD Inpatient   07/31/2016 1646 08/05/2016 1944 Full Code [id number]  Rondel Jumbo, PA-C ED  IV Access:   Peripheral IV   Procedures and diagnostic studies:   No results found.   Medical Consultants:   None.   Subjective:    Timothy Page pain is controlled has had over 6 bowel movements in the last 24 hours.  Objective:    Vitals:   02/23/21 0357 02/23/21 1205 02/23/21 2013 02/24/21 0413  BP: 131/88 124/84 111/73 122/77  Pulse: 79 77 68 77   Resp: 16 18 18 18   Temp: 98.2 F (36.8 C) 97.7 F (36.5 C) 98.1 F (36.7 C) 98.4 F (36.9 C)  TempSrc: Oral Oral Oral Oral  SpO2: 97% 98% 97% 99%  Weight:      Height:       SpO2: 99 %   Intake/Output Summary (Last 24 hours) at 02/24/2021 0907 Last data filed at 02/23/2021 1300 Gross per 24 hour  Intake 370 ml  Output --  Net 370 ml   Filed Weights   02/12/21 1633  Weight: 81.5 kg    Exam: General exam: In no acute distress. Respiratory system: Good air movement and clear to auscultation. Cardiovascular system: S1 & S2 heard, RRR. No JVD, Gastrointestinal system: Abdomen is nondistended, soft and nontender.  Extremities: No pedal edema. Skin: No rashes, lesions or ulcers Psychiatry: Judgement and insight appear normal. Mood & affect appropriate.    Data Reviewed:    Labs: Basic Metabolic Panel: Recent Labs  Lab 02/21/21 1600 02/23/21 1209 02/24/21 0420  NA 138 139 138  K 3.4* 3.7 3.4*  CL 103 103 101  CO2 24 26 26   GLUCOSE 175* 174* 170*  BUN 9 11 13   CREATININE 0.87 0.64 0.74  CALCIUM 9.2 8.7* 9.2  MG  --   --  2.0   GFR Estimated Creatinine Clearance: 93.3 mL/min (by C-G formula based on SCr of 0.74 mg/dL). Liver Function Tests: No results for input(s): AST, ALT, ALKPHOS, BILITOT, PROT, ALBUMIN in the last 168 hours. No results for input(s): LIPASE, AMYLASE in the last 168 hours. No results for input(s): AMMONIA in the last 168 hours. Coagulation profile No results for input(s): INR, PROTIME in the last 168 hours. COVID-19 Labs  No results for input(s): DDIMER, FERRITIN, LDH, CRP in the last 72 hours.  Lab Results  Component Value Date   SARSCOV2NAA NEGATIVE 02/12/2021   SARSCOV2NAA NEGATIVE 07/13/2020   SARSCOV2NAA NEGATIVE 06/15/2020   SARSCOV2NAA RESULT: NEGATIVE 01/22/2020    CBC: Recent Labs  Lab 02/21/21 1530 02/23/21 1209 02/24/21 0420  WBC 3.7* 4.4 3.9*  NEUTROABS 2.3 3.0 1.9  HGB 10.6* 11.1* 11.2*  HCT 31.3* 32.7* 32.9*   MCV 92.1 91.9 93.2  PLT 205 246 241   Cardiac Enzymes: No results for input(s): CKTOTAL, CKMB, CKMBINDEX, TROPONINI in the last 168 hours. BNP (last 3 results) No results for input(s): PROBNP in the last 8760 hours. CBG: Recent Labs  Lab 02/23/21 1620 02/23/21 2014 02/23/21 2356 02/24/21 0413 02/24/21 0738  GLUCAP 162* 153* 151* 154* 164*   D-Dimer: No results for input(s): DDIMER in the last 72 hours. Hgb A1c: No results for input(s): HGBA1C in the last 72 hours. Lipid Profile: No results for input(s): CHOL, HDL, LDLCALC, TRIG, CHOLHDL, LDLDIRECT in the last 72 hours. Thyroid function studies: No results for input(s): TSH, T4TOTAL, T3FREE, THYROIDAB in the last 72 hours.  Invalid input(s): FREET3 Anemia work up: No results for input(s): VITAMINB12, FOLATE, FERRITIN, TIBC, IRON, RETICCTPCT in the last 72 hours. Sepsis Labs: Recent Labs  Lab 02/21/21 1530 02/23/21  1209 02/24/21 0420  WBC 3.7* 4.4 3.9*   Microbiology No results found for this or any previous visit (from the past 240 hour(s)).   Medications:    amLODipine  10 mg Oral Daily   apixaban  5 mg Oral BID   bisacodyl  10 mg Oral Daily   bisacodyl  10 mg Rectal Once   Chlorhexidine Gluconate Cloth  6 each Topical Daily   influenza vac split quadrivalent PF  0.5 mL Intramuscular Tomorrow-1000   insulin aspart  0-9 Units Subcutaneous TID WC   levothyroxine  137 mcg Oral Q0600   lipase/protease/amylase  36,000 Units Oral TID AC   multivitamin with minerals  1 tablet Oral Daily   oxyCODONE  30 mg Oral Q12H   polyethylene glycol  17 g Oral BID   prochlorperazine  10 mg Oral BID   Ensure Max Protein  11 oz Oral BID   sodium chloride flush  10-40 mL Intracatheter Q12H   Continuous Infusions:    LOS: 12 days   Charlynne Cousins  [facility]  02/24/2021, 9:07 AM

## 2021-02-24 NOTE — Consult Note (Addendum)
Palliative Care Consult Note                                  Date: 02/24/2021   Patient Name: Timothy Page  DOB: 12-22-1955  MRN: [id number]  Age / Sex: 66 y.o., male  PCP: [name], MD Referring Physician: Charlynne Cousins, MD  Reason for Consultation: Non pain symptom management and Pain control  HPI/Patient Profile: Palliative Care consult requested for goals of care discussion in this 66 y.o. male  with past medical history of metastatic stage IV pancreatic cancer with liver and lung involvement, type 2 diabetes, hypertension, CVA, and hyperlipidemia.  He was admitted on 02/12/2021 after being seen at the cancer center with abdominal pain, CT of abdomen showing acute pancreatitis.   Past Medical History:  Diagnosis Date   Arthritis    Atypical chest pain 04/2017   ACS ruled out 05/11/17   CAP (community acquired pneumonia) 05/07/2017   Chronic constipation    at least partially d/t chronic opioids   Diabetes mellitus without complication ([place])    Poor control starting fall 2021-->GAD ab neg.  Insulin and C peptide levels wnl but on lower end of normal->Dr Kuma/endo 2022.   Exocrine pancreatic insufficiency 2022   History of thyroidectomy, total 2018   Hurlthe cell adenoma   Hx of adenomatous polyp of colon 07/02/2009   rpt colonoscopy 2026   Hyperlipidemia    Intol of multiple statins and zetia. [facility] 2022.   Hypertension    Lipoma    back   Myocardial infarction [facility])    MI at age 60   Pancreatic adenocarcinoma ([place]) 06/2020   Mets to liver (?and lungs?->stage 4), w/acute pancreatitis.   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 Cammy Copa   Right ureteral calculus 04/2018    Cystoscopy with right retrograde pyelogram and right ureteral stent placement after stone removal.   Seasonal allergies    Stroke ([place])    in 52V no complications    Tachyarrhythmia 1980   age 63 X 2 over 73 month period.  No recurrence since that time.   Thoracic aortic aneurysm    Incidentally discovered, 4.1 cm-->Dr. Cyndia Bent. Rpt CT 08/2018 and 08/2019 stable.   Vocal cord paralysis 07/2016   Left vocal cord paralysis noted after thyroid surgery.     Subjective:   This NP Osborne Oman reviewed medical records, received report from team, assessed the patient and then met at the patient's bedside with his wife and pastor to discuss ongoing symptom management needs.    I am familiar with [name] as I have been following him at [facility] for palliative needs and symptom management.    [name] is resting in bed, awake alert and oriented x3.  His wife and pastor is at the bedside.  Patient provides permission to have ongoing discussions during their presence.  We discussed at length his current pain regimen.  He states he continues to have ongoing abdominal pain however some improvement with current regimen.  Pain is worse in the later evenings and during the night.  Was also having some concerns with constipation however this has now been resolved and  he is having frequent bowel movements.  His current pain regimen consists of p.o. Dilaudid 3 mg every 4 hours as needed for breakthrough pain and OxyContin 30 mg every 12 hours.  His OxyContin have been titrated over the past several days with increased to 30 mg on last night.  Mr. Eisenberger does feel that this has made a difference.  We discussed ongoing plans to closely evaluate pain and make adjustments as needed.  Education provided on the use of gabapentin at bedtime to hopefully gain better pain control specifically at night.  He and wife verbalized understanding and appreciation.  Education provided on long-acting pain control  and a plan to continue to titrate as needed on an outpatient basis.  He knows I will plan to follow-up with him once he is discharged from the hospital but will also continue to provide ongoing support while hospitalized.  I discussed the importance of continued conversation with family and their medical providers regarding overall plan of care and treatment options, ensuring decisions are within the context of the patients values and GOCs.  Questions and concerns were addressed.  Patient and family was encouraged to call with questions or concerns.  PMT will continue to support holistically as needed.  Objective:   Primary Diagnoses: Present on Admission:  Pancreatitis  Nausea without vomiting  Essential hypertension  Thrombocytopenia (HCC)  Postoperative hypothyroidism  Mixed hyperlipidemia  Anemia associated with chemotherapy   Scheduled Meds:  amLODipine  10 mg Oral Daily   apixaban  5 mg Oral BID   bisacodyl  10 mg Rectal Once   Chlorhexidine Gluconate Cloth  6 each Topical Daily   insulin aspart  0-9 Units Subcutaneous TID WC   levothyroxine  137 mcg Oral Q0600   lipase/protease/amylase  36,000 Units Oral TID AC   multivitamin with minerals  1 tablet Oral Daily   oxyCODONE  30 mg Oral Q12H   potassium chloride  40 mEq Oral BID   prochlorperazine  10 mg Oral BID   Ensure Max Protein  11 oz Oral BID    Continuous Infusions:   PRN Meds: acetaminophen **OR** acetaminophen, bisacodyl, HYDROmorphone, LORazepam, polyethylene glycol, sodium chloride flush  Allergies  Allergen Reactions   Atenolol     Severe "slowing"of functioning Severe "slowing"of functioning   Hydroxyzine Other (See Comments)    "felt like I was coming out of my skin"   Tetracycline     Rash Because of a history of documented adverse serious drug reaction;Medi Alert bracelet  is recommended Rash Because of a history of documented adverse serious drug reaction;Medi Alert bracelet  is recommended    Atorvastatin     D/Ced by him due to Myalgias in Sept 2014 D/Ced by him due to Myalgias in Sept 2014   Codeine     Hallucinations. "I see pink elephants"   Guaifenesin     Other reaction(s): Other (See Comments) unknown unknown   Mucinex [Guaifenesin Er] Other (See Comments)    Hallucinations "I see pink elephants"    Review of Systems  Constitutional:  Positive for appetite change.  Gastrointestinal:  Negative for abdominal pain.  Neurological:  Positive for weakness.  Unless otherwise noted, a complete review of systems is negative.  Physical Exam General: NAD, appears comfortable Cardiovascular: regular rate and rhythm Pulmonary: clear ant fields, diminished bilaterally  Abdomen: soft, right upper and lower sided tenderness, + bowel sounds Extremities: no edema, no joint deformities Skin: no rashes, warm and dry Neurological: AAOx3 Vital Signs:  BP 119/79 (BP Location: Left Arm)    Pulse 83    Temp 98.2 F (36.8 C) (Oral)    Resp 20    Ht _0  (1.753 m)    Wt 81.5 kg    SpO2 96%    BMI 26.54 kg/m  Pain Scale: 0-10   Pain Score: 2   SpO2: SpO2: 96 % O2 Device:SpO2: 96 % O2 Flow Rate: .   IO: Intake/output summary: No intake or output data in the 24 hours ending 02/24/21 1325  LBM: Last BM Date: 02/24/21 Baseline Weight: Weight: 81.5 kg Most recent weight: Weight: 81.5 kg      Palliative Assessment/Data:    Advanced Care Planning:   Primary Decision Maker: PATIENT  Code Status/Advance Care Planning: DNR   Assessment & Plan:   SUMMARY OF RECOMMENDATIONS    Continue with current plan of care  Ongoing support while hospitalized and outpatient.  I will plan to see patient s/p discharge at [facility] for ongoing symptom management and palliative needs. Please call team line with urgent needs.  Symptom Management:  Pain related to neoplasm/pancreatitis OxyContin 30 mg every 12 hours for long-acting pain control.  This seems to be making a  difference in his level of pain and providing some coverage throughout the day.  No changes to current dose as he was just started on 30 mg overnight.  We will closely monitor and make adjustments as needed Dilaudid p.o. 3 mg every 4 hours as needed for breakthrough pain.  Patient has only received 1 dose of oral Dilaudid today with last dose being at 6:45 AM. Gabapentin 300 mg nightly Constipation This has been resolved with MiraLAX as needed, Dulcolax as needed, and sorbitol as needed.  He is now having frequent bowel movements. Anxiety Continues to have some anxiety in terms of the fear of the unknown and his significant cancer diagnosis.  Ativan every 6 hours as needed.  Palliative Prophylaxis:  Bowel Regimen and Frequent Pain Assessment  Additional Recommendations (Limitations, Scope, Preferences): Continue to treat the treatable, DNR  Psycho-social/Spiritual:  Desire for further Chaplaincy support: no Additional Recommendations:  Ongoing goals of care discussions and symptom management  Prognosis:  Guarded  Discharge Planning:  Home with Home Health and ongoing outpatient support  Patient and his wife expressed understanding and was in agreement with this plan.   Time Total: 55 min.   Visit consisted of counseling and education dealing with the complex and emotionally intense issues of symptom management and palliative care in the setting of serious and potentially life-threatening illness.Greater than 50%  of this time was spent counseling and coordinating care related to the above assessment and plan.  Signed by:  Alda Lea, AGPCNP-BC Palliative Medicine Team  Phone: [phone] Pager: [phone] Amion: Bjorn Pippin   Thank you for allowing the Palliative Medicine Team to assist in the care of this patient. Please utilize secure chat with additional questions, if there is no response within 30 minutes please call the above phone number. Palliative Medicine  Team providers are available by phone from 7am to 5pm daily and can be reached through the team cell phone.  Should this patient require assistance outside of these hours, please call the patient's attending physician.

## 2021-02-25 ENCOUNTER — Telehealth: Payer: Self-pay | Admitting: Hematology and Oncology

## 2021-02-25 DIAGNOSIS — E440 Moderate protein-calorie malnutrition: Secondary | ICD-10-CM

## 2021-02-25 LAB — GLUCOSE, CAPILLARY
Glucose-Capillary: 162 mg/dL — ABNORMAL HIGH (ref 70–99)
Glucose-Capillary: 228 mg/dL — ABNORMAL HIGH (ref 70–99)

## 2021-02-25 MED ORDER — AMLODIPINE BESYLATE 10 MG PO TABS: 10.0000 mg | ORAL_TABLET | Freq: Every day | ORAL | 3 refills | Status: AC

## 2021-02-25 MED ORDER — HEPARIN SOD (PORK) LOCK FLUSH 100 UNIT/ML IV SOLN
500.0000 [IU] | INTRAVENOUS | Status: AC | PRN
  Administered 2021-02-25: 500 [IU]
  Filled 2021-02-25: qty 5

## 2021-02-25 MED ORDER — GABAPENTIN 300 MG PO CAPS
300.0000 mg | ORAL_CAPSULE | Freq: Every day | ORAL | 0 refills | Status: AC
Start: 2021-02-25 — End: ?

## 2021-02-25 MED ORDER — OXYCODONE HCL ER 30 MG PO T12A: 30.0000 mg | EXTENDED_RELEASE_TABLET | Freq: Two times a day (BID) | ORAL | 0 refills | Status: DC

## 2021-02-25 NOTE — Discharge Summary (Addendum)
Physician Discharge Summary  Timothy Page [name]:[id number] DOB: Apr 19, 1955 DOA: 02/12/2021  PCP: [name], MD  Admit date: 02/12/2021 Discharge date: 02/25/2021  Admitted From: Home Disposition:  Home  Recommendations for Outpatient Follow-up:  Follow up with oncology in 1-2 weeks Please obtain BMP/CBC in one week Hospice and palliative care to follow-up at home   Home Health:No Equipment/Devices:none  Discharge Condition:Stable CODE STATUS:DNR Diet recommendation: Heart Healthy   Brief/Interim Summary: 66 y.o. male past medical history of stage IV pancreatic cancer with metastasis to the liver and lung diabetes mellitus type 2 essential hypertension portal vein thrombosis on Eliquis presented to the ED with nausea vomiting and abdominal pain, CT scan of the abdomen pelvis showed acute edematous pancreatitis, GI and oncology were consulted  Discharge Diagnoses:  Acute pancreatitis probably medication induced: In the setting of metastatic stage IV pancreatic cancer to liver and lung, patient was recently started on Xeloda. Oncology was consulted recommended to stop Xeloda. He will be changed as an outpatient to 5-FU and Gemzar combination. CT scan of the abdomen pelvis was done that showed acute edematous pancreatitis.  Sign he was placed n.p.o. and on narcotics with conservative treatment his appetite returned his nausea resolved. His narcotic regimen was changed which will continue as an outpatient. Hospice and Perative care met with the patient in house and will continue to follow up with him at home.  Diabetes mellitus type 2: With an A1c of 7.4 no changes made to his medication.  Essential hypertension: ACE inhibitor was discontinued he was started on Norvasc which she will continue as an outpatient.  Hypothyroidism: Continue Synthroid.  Dyslipidemia: Discontinue statins due to myalgias.  Pancytopenia secondary to antineoplastic drug: No signs of overt  bleeding while stopping Xeloda he seem more globin and other cell lines started to improve.  History of portal vein thrombosis: Continue Eliquis no changes made.  Hypokalemia: It was repleted already now resolved.  Constipation: Resolved we will continue current home regimen.  Metastatic pancreatic cancer: Will follow-up with oncology in 2 weeks as he will be transition to 5-FU and Gemzar combination as an outpatient.  Discharge Instructions   Allergies as of 02/25/2021       Reactions   Atenolol    Severe "slowing"of functioning Severe "slowing"of functioning   Hydroxyzine Other (See Comments)   "felt like I was coming out of my skin"   Tetracycline    Rash Because of a history of documented adverse serious drug reaction;Medi Alert bracelet  is recommended Rash Because of a history of documented adverse serious drug reaction;Medi Alert bracelet  is recommended   Atorvastatin    D/Ced by him due to Myalgias in Sept 2014 D/Ced by him due to Myalgias in Sept 2014   Codeine    Hallucinations. "I see pink elephants"   Guaifenesin    Other reaction(s): Other (See Comments) unknown unknown   Mucinex [guaifenesin Er] Other (See Comments)   Hallucinations "I see pink elephants"        Medication List     STOP taking these medications    capecitabine 500 MG tablet Commonly known as: XELODA   magic mouthwash (nystatin, lidocaine, diphenhydrAMINE, alum & mag hydroxide) suspension   ramipril 10 MG capsule Commonly known as: ALTACE       TAKE these medications    amLODipine 10 MG tablet Commonly known as: NORVASC Take 1 tablet (10 mg total) by mouth daily.   apixaban 5 MG Tabs tablet Commonly known as: Eliquis TAKE  1 TABLET(5 MG) BY MOUTH TWICE DAILY What changed:  how much to take how to take this when to take this additional instructions   docusate sodium 100 MG capsule Commonly known as: COLACE Take 100 mg by mouth 2 (two) times daily.   FreeStyle  Libre 2 Sensor Misc APPLY 1 SENSOR TO THE SKIN EVERY 14 DAYS AS DIRECTED   gabapentin 300 MG capsule Commonly known as: NEURONTIN Take 1 capsule (300 mg total) by mouth at bedtime. What changed:  medication strength how much to take when to take this additional instructions   HumaLOG KwikPen 100 UNIT/ML KwikPen Generic drug: insulin lispro INJECT SUBCUTANEOUSLY UP TO 12 UNITS BEFORE MEALS 3  TIMES DAILY What changed: See the new instructions.   HYDROmorphone 2 MG tablet Commonly known as: Dilaudid Take 1 tablet (2 mg total) by mouth every 6 (six) hours as needed for severe pain.   Insulin Pen Needle 31G X 5 MM Misc Use with insulin pen 3 times a day   lactulose 10 GM/15ML solution Commonly known as: CHRONULAC Take 15 mLs (10 g total) by mouth 3 (three) times daily as needed for mild constipation.   Lantus SoloStar 100 UNIT/ML Solostar Pen Generic drug: insulin glargine Give up to 10 units under the skin every evening at bedtime.   levothyroxine 137 MCG tablet Commonly known as: SYNTHROID TAKE 1 TABLET BY MOUTH  DAILY BEFORE BREAKFAST   lipase/protease/amylase 36000 UNITS Cpep capsule Commonly known as: Creon Take 2 capsules (72,000 Units total) by mouth 3 (three) times daily with meals. May also take 1 capsule (36,000 Units total) as needed (with snacks).   LORazepam 0.5 MG tablet Commonly known as: ATIVAN TAKE 1 TABLET(0.5 MG) BY MOUTH EVERY 12 HOURS AS NEEDED FOR ANXIETY What changed: See the new instructions.   magnesium hydroxide 400 MG/5ML suspension Commonly known as: MILK OF MAGNESIA Take by mouth daily as needed for mild constipation.   oxyCODONE 30 MG 12 hr tablet Take 1 tablet (30 mg total) by mouth every 12 (twelve) hours for 5 days.   polyethylene glycol 17 g packet Commonly known as: MIRALAX / GLYCOLAX Take 17 g by mouth daily.   prochlorperazine 10 MG tablet Commonly known as: COMPAZINE TAKE 1 TABLET(10 MG) BY MOUTH EVERY 6 HOURS AS NEEDED FOR  NAUSEA OR VOMITING What changed: See the new instructions.        Allergies  Allergen Reactions   Atenolol     Severe "slowing"of functioning Severe "slowing"of functioning   Hydroxyzine Other (See Comments)    "felt like I was coming out of my skin"   Tetracycline     Rash Because of a history of documented adverse serious drug reaction;Medi Alert bracelet  is recommended Rash Because of a history of documented adverse serious drug reaction;Medi Alert bracelet  is recommended   Atorvastatin     D/Ced by him due to Myalgias in Sept 2014 D/Ced by him due to Myalgias in Sept 2014   Codeine     Hallucinations. "I see pink elephants"   Guaifenesin     Other reaction(s): Other (See Comments) unknown unknown   Mucinex [Guaifenesin Er] Other (See Comments)    Hallucinations "I see pink elephants"    Consultations: Oncology Gastroenterology   Procedures/Studies: CT ABDOMEN PELVIS W CONTRAST  Result Date: 02/21/2021 CLINICAL DATA:  Evaluate for pancreatitis. History of pancreatic neoplasm. EXAM: CT ABDOMEN AND PELVIS WITH CONTRAST TECHNIQUE: Multidetector CT imaging of the abdomen and pelvis was performed using the  standard protocol following bolus administration of intravenous contrast. CONTRAST:  20m OMNIPAQUE IOHEXOL 350 MG/ML SOLN COMPARISON:  02/12/2021 FINDINGS: Lower chest: The imaged portions of the lung bases appear clear scratch scattered lung nodules are again noted bilaterally. The largest is in the left lower lobe measuring 7 mm, image 9/5. This is unchanged from 01/18/2021. These are compatible with known metastatic disease from patient's primary pancreatic neoplasm. Hepatobiliary: There is no suspicious liver lesion. Previously noted areas of hypo attenuation are not seen on today's study. There is moderate diffuse distension of the gallbladder which appears filled with sludge. Mild central intrahepatic bile duct dilatation. Common bile duct appears nondilated. Pancreas:  No significant change in the appearance of the pancreas when compared with 02/12/2021. The pancreas appears diffusely edematous with diffuse peripancreatic inflammatory fat stranding. Numerous low-attenuation foci are again noted throughout the pancreas including the neck, body and tail which appear similar to study from 02/12/2021. The pancreatic head mass is difficult to distinguish from these changes as mentioned on the previous exam. Spleen: Splenomegaly. Spleen measures 13 cm in cranial caudal dimension. Adrenals/Urinary Tract: Adrenal glands are unremarkable. Kidneys are normal, without renal calculi, focal lesion, or hydronephrosis. Bladder is unremarkable. Stomach/Bowel: Moderate distension of the gastric lumen. No signs of gastric outlet obstruction. Enteric contrast material is identified within the small bowel loops. There is mild wall thickening and luminal narrowing involving the terminal ileum without surrounding inflammatory changes, image 75/2. Vascular/Lymphatic: Aortic atherosclerosis. The extrahepatic portal vein and the portal venous confluence is encased and markedly narrowed by the head of pancreas mass as noted previously. Superior mesenteric vein remains patent. Extensive upper abdominal collateral vessels are identified. No enlarged abdominal or pelvic lymph nodes. Reproductive: Prostate is unremarkable. Other: Small volume of free fluid identified within the posterior pelvis, similar to the previous exam. No discrete fluid collections identified. Musculoskeletal: No acute or significant osseous findings. IMPRESSION: 1. No significant change in the appearance of the pancreas when compared with 02/12/2021. As noted previously there are changes of acute pancreatitis superimposed on the locally advanced pancreatic neoplasm. 2. Dilated pancreatic duct with multiple low-attenuation foci throughout the pancreatic parenchyma compatible with acute interstitial edematous pancreatitis with dilated  side branches. 3. Encasement and marked narrowing of the extrahepatic portal vein and portal venous confluence with secondary collateral vessel formation throughout the upper abdomen. 4. Previously noted distinct focal areas of low attenuation within the liver are difficult to see on today's study and may have been areas of focal fatty deposition. Been 5. There is mild wall thickening and luminal narrowing involving the terminal ileum without surrounding inflammatory changes. Findings are nonspecific and may reflect sequelae of inflammation or infection versus incomplete distension. 6. Splenomegaly. 7. Stable appearance of lung nodules compatible with known metastatic disease from patient's primary pancreatic neoplasm. 8. Aortic Atherosclerosis (ICD10-I70.0). Electronically Signed   By: TKerby MoorsM.D.   On: 02/21/2021 21:50   CT ABDOMEN PELVIS W CONTRAST  Result Date: 02/12/2021 CLINICAL DATA:  Pancreatic cancer with acute abdominal pain. Mid abdominal pain since diagnosis now worsening since 02/08/2021. Also reportedly with nausea constipation. EXAM: CT ABDOMEN AND PELVIS WITH CONTRAST TECHNIQUE: Multidetector CT imaging of the abdomen and pelvis was performed using the standard protocol following bolus administration of intravenous contrast. CONTRAST:  82mOMNIPAQUE IOHEXOL 350 MG/ML SOLN COMPARISON:  November 02, 2020 also compared with January 18, 2021. FINDINGS: Lower chest: Port-A-Cath terminates in the distal superior vena cava. Lung bases are clear. No effusion. No consolidative changes. Hepatobiliary: Subtle  peripheral foci of arterial phase enhancement without washout particularly about the gallbladder fossa no substantial change compared to previous imaging. Hypodense area along the gallbladder fossa (image 52/7) 15 mm stable since the December 5th evaluation. Hypodensity anterior to the porta hepatis stable since December 5th as well. Portal vein is occluded at the level of the pancreatic  head though main portal vein and RIGHT LEFT portal vein into the liver are patent. No pericholecystic stranding or intrahepatic biliary duct distension. Pancreas: Pancreatic head mass obstructing the main pancreatic duct is difficult to separate from numerous areas of low attenuation about the pancreatic parenchyma that have developed surrounding ductal dilation that was seen on more remote studies. 2.5 cm area is suspected in the head of the pancreas obstructing the duct. Nodularity in the transverse mesocolon. Areas of low attenuation in the pancreatic tail have increased in size, for instance (image 40/7) 18 mm as compared to 14 mm. Other areas of low attenuation are minimally increased. Spleen: Normal. Adrenals/Urinary Tract: Insert normal renal contrast Stomach/Bowel: Abundant collateral venous structures about the stomach related to occluded splenic portal confluence. Engorgement of the gastro colic trunk and gastroepiploic veins. This is similar to prior studies. 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 Inflammation contacts the posterior wall of the gastric antrum. No sign of bowel obstruction. The appendix is normal. Vascular/Lymphatic: Aortic atherosclerosis. No sign of aneurysm. Smooth contour of the IVC. There is no gastrohepatic or hepatoduodenal ligament lymphadenopathy. No retroperitoneal or mesenteric lymphadenopathy. No pelvic sidewall lymphadenopathy. Reproductive: Unremarkable. Other: No free intraperitoneal air. No overt peritoneal nodularity. Trace ascites. This is present only in the pelvis Musculoskeletal: No acute bone finding. No destructive bone process. Spinal degenerative changes. 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 Irregular dilated main pancreatic duct gives way to ovoid areas of low attenuation surrounded by inflammation in  the midst of remaining parenchyma in the pancreatic tail. Findings are compatible with acute interstitial edematous pancreatitis likely with dilated side branches. It would be difficult to exclude concomitant worsening of pancreatic neoplasm in the current setting. What can be seen in the pancreatic head is very similar to previous imaging. Decreasing size of low-attenuation areas in the liver, areas along the gallbladder fossa and anterior to porta hepatis are compatible with focal fat deposition the other area seen inferior to these areas along hepatic subsegment VI based on appearance and evolution is also more likely to represent focal fat. Consider attention on follow-up or a differentiation would change management PET or MRI could be considered as discussed previously. These results will be called to the ordering clinician or representative by the Radiologist Assistant, and communication documented in the PACS or [employer]. Electronically Signed   By: Zetta Bills M.D.   On: 02/12/2021 15:19   (Echo, Carotid, EGD, Colonoscopy, ERCP)    Subjective: No complaints tolerating his diet he relates his pain regimen is working.  Discharge Exam: Vitals:   02/24/21 2037 02/25/21 0546  BP: 119/84 122/80  Pulse: 78 79  Resp: 20 16  Temp: 98.9 F (37.2 C) 98.5 F (36.9 C)  SpO2: 97% 97%   Vitals:   02/24/21 0413 02/24/21 1227 02/24/21 2037 02/25/21 0546  BP: 122/77 119/79 119/84 122/80  Pulse: 77 83 78 79  Resp: _0 Temp: 98.4 F (36.9 C) 98.2 F (36.8 C) 98.9 F (37.2 C) 98.5 F (36.9 C)  TempSrc:  Oral Oral Oral   SpO2: 99% 96% 97% 97%  Weight:      Height:        General: Pt is alert, awake, not in acute distress Cardiovascular: RRR, S1/S2 +, no rubs, no gallops Respiratory: CTA bilaterally, no wheezing, no rhonchi Abdominal: Soft, NT, ND, bowel sounds + Extremities: no edema, no cyanosis    The results of significant diagnostics from this hospitalization  (including imaging, microbiology, ancillary and laboratory) are listed below for reference.     Microbiology: No results found for this or any previous visit (from the past 240 hour(s)).   Labs: BNP (last 3 results) No results for input(s): BNP in the last 8760 hours. Basic Metabolic Panel: Recent Labs  Lab 02/21/21 1600 02/23/21 1209 02/24/21 0420  NA 138 139 138  K 3.4* 3.7 3.4*  CL 103 103 101  CO2 _0 GLUCOSE 175* 174* 170*  BUN _1 CREATININE 0.87 0.64 0.74  CALCIUM 9.2 8.7* 9.2  MG  --   --  2.0   Liver Function Tests: No results for input(s): AST, ALT, ALKPHOS, BILITOT, PROT, ALBUMIN in the last 168 hours. No results for input(s): LIPASE, AMYLASE in the last 168 hours. No results for input(s): AMMONIA in the last 168 hours. CBC: Recent Labs  Lab 02/21/21 1530 02/23/21 1209 02/24/21 0420  WBC 3.7* 4.4 3.9*  NEUTROABS 2.3 3.0 1.9  HGB 10.6* 11.1* 11.2*  HCT 31.3* 32.7* 32.9*  MCV 92.1 91.9 93.2  PLT 205 246 241   Cardiac Enzymes: No results for input(s): CKTOTAL, CKMB, CKMBINDEX, TROPONINI in the last 168 hours. BNP: Invalid input(s): POCBNP CBG: Recent Labs  Lab 02/24/21 1224 02/24/21 1649 02/24/21 2039 02/25/21 0751 02/25/21 1135  GLUCAP 232* 173* 150* 162* 228*   D-Dimer No results for input(s): DDIMER in the last 72 hours. Hgb A1c No results for input(s): HGBA1C in the last 72 hours. Lipid Profile No results for input(s): CHOL, HDL, LDLCALC, TRIG, CHOLHDL, LDLDIRECT in the last 72 hours. Thyroid function studies No results for input(s): TSH, T4TOTAL, T3FREE, THYROIDAB in the last 72 hours.  Invalid input(s): FREET3 Anemia work up No results for input(s): VITAMINB12, FOLATE, FERRITIN, TIBC, IRON, RETICCTPCT in the last 72 hours. Urinalysis    Component Value Date/Time   COLORURINE AMBER (A) 07/13/2020 1830   APPEARANCEUR CLOUDY (A) 07/13/2020 1830   LABSPEC 1.033 (H) 07/13/2020 1830   PHURINE 5.0 07/13/2020 1830   GLUCOSEU  50 (A) 07/13/2020 1830   GLUCOSEU NEGATIVE 09/13/2016 0946   HGBUR NEGATIVE 07/13/2020 1830   HGBUR negative 05/05/2009 1323   BILIRUBINUR NEGATIVE 07/13/2020 1830   BILIRUBINUR Negative 11/22/2019 1048   KETONESUR 20 (A) 07/13/2020 1830   PROTEINUR 30 (A) 07/13/2020 1830   UROBILINOGEN 0.2 11/22/2019 1048   UROBILINOGEN 0.2 09/13/2016 0946   NITRITE NEGATIVE 07/13/2020 1830   LEUKOCYTESUR NEGATIVE 07/13/2020 1830   Sepsis Labs Invalid input(s): PROCALCITONIN,  WBC,  LACTICIDVEN Microbiology No results found for this or any previous visit (from the past 240 hour(s)).  SIGNED:   Charlynne Cousins, MD  [facility] 02/25/2021, 11:51 AM Pager   If 7PM-7AM, please contact night-coverage [email or web address] Password [email or web address]

## 2021-02-25 NOTE — Telephone Encounter (Signed)
Scheduled appointment per 01/12 los. Patient is aware.

## 2021-02-26 ENCOUNTER — Encounter: Payer: Self-pay | Admitting: Hematology and Oncology

## 2021-02-26 ENCOUNTER — Other Ambulatory Visit: Payer: Self-pay | Admitting: Nurse Practitioner

## 2021-02-26 MED ORDER — OXYCODONE HCL ER 30 MG PO T12A: 30.0000 mg | EXTENDED_RELEASE_TABLET | Freq: Two times a day (BID) | ORAL | 0 refills | Status: DC

## 2021-02-26 NOTE — Telephone Encounter (Signed)
Plan member [id number]: [id number] Case number: [id number] Prescriber name: Chesley Noon Pickenpack-Cousar   OXYCONTIN TAB 30MG  ER, use as directed (60 per month), is approved for 24 months through 02/27/2023 or until coverage for the medication is no longer available under your benefit plan

## 2021-03-01 ENCOUNTER — Inpatient Hospital Stay: Payer: 59

## 2021-03-01 ENCOUNTER — Other Ambulatory Visit: Payer: Self-pay | Admitting: *Deleted

## 2021-03-01 ENCOUNTER — Inpatient Hospital Stay (HOSPITAL_BASED_OUTPATIENT_CLINIC_OR_DEPARTMENT_OTHER): Payer: 59 | Admitting: Hematology and Oncology

## 2021-03-01 ENCOUNTER — Other Ambulatory Visit: Payer: Self-pay

## 2021-03-01 ENCOUNTER — Inpatient Hospital Stay (HOSPITAL_BASED_OUTPATIENT_CLINIC_OR_DEPARTMENT_OTHER): Payer: 59 | Admitting: Nurse Practitioner

## 2021-03-01 ENCOUNTER — Encounter: Payer: Self-pay | Admitting: Hematology and Oncology

## 2021-03-01 ENCOUNTER — Encounter: Payer: Self-pay | Admitting: Nurse Practitioner

## 2021-03-01 VITALS — BP 121/75 | HR 100 | Temp 97.9°F | Resp 18 | Wt 174.6 lb

## 2021-03-01 DIAGNOSIS — C787 Secondary malignant neoplasm of liver and intrahepatic bile duct: Secondary | ICD-10-CM | POA: Diagnosis present

## 2021-03-01 DIAGNOSIS — R531 Weakness: Secondary | ICD-10-CM | POA: Diagnosis not present

## 2021-03-01 DIAGNOSIS — Z95828 Presence of other vascular implants and grafts: Secondary | ICD-10-CM

## 2021-03-01 DIAGNOSIS — C259 Malignant neoplasm of pancreas, unspecified: Secondary | ICD-10-CM

## 2021-03-01 DIAGNOSIS — G893 Neoplasm related pain (acute) (chronic): Secondary | ICD-10-CM | POA: Diagnosis not present

## 2021-03-01 DIAGNOSIS — Z515 Encounter for palliative care: Secondary | ICD-10-CM

## 2021-03-01 DIAGNOSIS — E039 Hypothyroidism, unspecified: Secondary | ICD-10-CM | POA: Diagnosis not present

## 2021-03-01 DIAGNOSIS — E119 Type 2 diabetes mellitus without complications: Secondary | ICD-10-CM | POA: Diagnosis not present

## 2021-03-01 DIAGNOSIS — C250 Malignant neoplasm of head of pancreas: Secondary | ICD-10-CM

## 2021-03-01 DIAGNOSIS — K5900 Constipation, unspecified: Secondary | ICD-10-CM

## 2021-03-01 DIAGNOSIS — G620 Drug-induced polyneuropathy: Secondary | ICD-10-CM | POA: Diagnosis not present

## 2021-03-01 DIAGNOSIS — C7800 Secondary malignant neoplasm of unspecified lung: Secondary | ICD-10-CM | POA: Diagnosis not present

## 2021-03-01 LAB — CMP (CANCER CENTER ONLY)
ALT: 81 U/L — ABNORMAL HIGH (ref 0–44)
AST: 80 U/L — ABNORMAL HIGH (ref 15–41)
Albumin: 4 g/dL (ref 3.5–5.0)
Alkaline Phosphatase: 204 U/L — ABNORMAL HIGH (ref 38–126)
Anion gap: 8 (ref 5–15)
BUN: 13 mg/dL (ref 8–23)
CO2: 26 mmol/L (ref 22–32)
Calcium: 9.3 mg/dL (ref 8.9–10.3)
Chloride: 102 mmol/L (ref 98–111)
Creatinine: 1.01 mg/dL (ref 0.61–1.24)
GFR, Estimated: >60 mL/min (ref >60)
Glucose, Bld: 207 mg/dL — ABNORMAL HIGH (ref 70–99)
Potassium: 3.8 mmol/L (ref 3.5–5.1)
Sodium: 136 mmol/L (ref 135–145)
Total Bilirubin: 1.8 mg/dL — ABNORMAL HIGH (ref 0.3–1.2)
Total Protein: 7.2 g/dL (ref 6.5–8.1)

## 2021-03-01 LAB — CBC WITH DIFFERENTIAL/PLATELET
Abs Immature Granulocytes: 0.01 10*3/uL (ref 0.00–0.07)
Basophils Absolute: 0.1 10*3/uL (ref 0.0–0.1)
Basophils Relative: 1 %
Eosinophils Absolute: 0.2 10*3/uL (ref 0.0–0.5)
Eosinophils Relative: 3 %
HCT: 33.8 % — ABNORMAL LOW (ref 39.0–52.0)
Hemoglobin: 11.6 g/dL — ABNORMAL LOW (ref 13.0–17.0)
Immature Granulocytes: 0 %
Lymphocytes Relative: 17 %
Lymphs Abs: 1 10*3/uL (ref 0.7–4.0)
MCH: 31 pg (ref 26.0–34.0)
MCHC: 34.3 g/dL (ref 30.0–36.0)
MCV: 90.4 fL (ref 80.0–100.0)
Monocytes Absolute: 0.7 10*3/uL (ref 0.1–1.0)
Monocytes Relative: 13 %
Neutro Abs: 3.8 10*3/uL (ref 1.7–7.7)
Neutrophils Relative %: 66 %
Platelets: 239 10*3/uL (ref 150–400)
RBC: 3.74 MIL/uL — ABNORMAL LOW (ref 4.22–5.81)
RDW: 13.2 % (ref 11.5–15.5)
WBC: 5.7 10*3/uL (ref 4.0–10.5)
nRBC: 0 % (ref 0.0–0.2)

## 2021-03-01 MED ORDER — SORBITOL 70 % PO SOLN: 15.0000 mL | Freq: Every day | ORAL | 0 refills | Status: DC | PRN

## 2021-03-01 MED ORDER — MORPHINE SULFATE ER 15 MG PO TBCR
15.0000 mg | EXTENDED_RELEASE_TABLET | Freq: Two times a day (BID) | ORAL | 0 refills | Status: DC
Start: 2021-03-01 — End: 2021-03-08

## 2021-03-01 NOTE — Progress Notes (Signed)
[place]   [phone]) [phone] [phone]   Clinic Follow up Note   Patient Care Team: [name], MD as PCP - General (Hematology and Oncology) Gatha Mayer, MD as Consulting Physician (Gastroenterology) [name], MD as Consulting Physician (Otolaryngology) Elayne Snare, MD as Consulting Physician (Endocrinology) Gaye Pollack, MD as Consulting Physician (Cardiothoracic Surgery) Lucas Mallow, MD as Consulting Physician (Urology) Debara Pickett Nadean Corwin, MD as Consulting Physician (Cardiology) [name], MD as Consulting Physician (Hematology and Oncology) Jonnie Finner, RN (Inactive) as Oncology Nurse Navigator Pickenpack-Cousar, Carlena Sax, NP as Nurse Practitioner (Nurse Practitioner)  Date of Service:  03/01/2021  CHIEF COMPLAINT: f/u of pancreatic cancer  CURRENT THERAPY:  Second-line gemcitabine/abraxane, started 11/10/20 -Abraxane held after 12/08/20 dose d/t neuropathy -Added xeloda to gemzar from 12/19, patient admitted with pancreatitis.  ASSESSMENT & PLAN:  Timothy Page is a 66 y.o. male with   1. 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  -He was started on Xeloda and gemcitabine given worsening neuropathy from Abraxane and was admitted with acute pancreatitis recently. He is now here post hospital discharge to discuss therapeutic recommendations.   He is still recovering from the hospital admission. From Sep to Dec imaging, there is no clear concern for progression. We will plan combination of 5 FU and gemcitabine since he tolerated 5 FU well in the past. We can try infusion 5 FU 500 mg/m2 continuous  D1-5. Gemcitabine was administered at 1000 mg/m2 D1.8.15 in the original study. I will try to make it D1 and D15 I would like to wait for another 2 weeks before initiating treatment, he is still very weak from the hospital admission.  2. Cancer related pain - Currently on oxycontin 30 mg PO BID and dilaudid 2 mg every 4 hrs PRN He apparently has been sleeping most of the day, and was wondering if we can decrease the dose to 20 mg PO BID He will be seeing palliative care team today, will allow them to titrate the medication to avoid confusion. Currently with pain medication, he rates his pain 5/10.   3. Neuropathy due to chemotherapeutic drug ([place]), G2 -Secondary to oxaliplatin and Abraxane -He is taking gabapentin 300 mg po QHS. -We will continue to monitor.   4. weight loss, lost about 5 lbs since last visit. He is unable to tolerate much food because of the pain. Encouraged to try smoothies to help with nutrition. We will refer him to nutrition if he continues to deteriorate.  5. Nausea,  has been taking compazine PRN.     Plan As mentioned above   No problem-specific Assessment & Plan notes found for this encounter.   SUMMARY OF ONCOLOGIC HISTORY: Oncology History  Malignant neoplasm of pancreas ([place])  06/20/2020 Initial Diagnosis   Adenocarcinoma of pancreas, stage 4 ([place])   07/01/2020 - 10/21/2020 Chemotherapy          07/01/2020 Genetic Testing   Negative genetic testing on a custom panel reviewing multicancer and pancreatitis genes.  The Custom-Gene Panel offered by [facility] includes sequencing and/or deletion duplication testing of the following 91 genes: AIP, ALK, APC*, ATM*, AXIN2, BAP1, BARD1, BLM, BMPR1A, BRCA1, BRCA2, BRIP1,  CASR, CDC73, CDH1, CDK4, CDKN1B, CDKN1C, CDKN2A (p14ARF), CDKN2A (p16INK4a), CEBPA, CFTR*, CHEK2, CPA1, CTNNA1, CTRC, DICER1*, DIS3L2, EGFR, EPCAM*, FANCC, FH*, FLCN, GATA2, GPC3*, GREM1*, HOXB13, HRAS, KIT, MAX*, MEN1*, MET*, MITF*, MLH1*, MSH2*,  MSH3*, MSH6*, MUTYH, NBN, NF1*, NF2, NTHL1, PALB2, PALLD, PDGFRA, PHOX2B*, PMS2*, POLD1*, POLE, POT1, PRKAR1A, PRSS1*, PTCH1, PTEN*, RAD50, RAD51C, RAD51D, RB1*, RECQL4*, RET, RUNX1, SDHA*, SDHAF2, SDHB, SDHC*, SDHD, SMAD4, SMARCA4, SMARCB1, SMARCE1, SPINK1, STK11, SUFU, TERC, TERT, TMEM127, TP53, TSC1*, TSC2, VHL, WRN*, and WT1.  The report date is Jul 01, 2020.   08/26/2020 Imaging   Imaging after 4 cycles of therapy  IMPRESSION: 1. Pancreatic neck mass is stable. 2. No new or progressive metastatic disease. Tiny soft tissue nodule along the posteroinferior right liver capsule is stable. Solitary liver metastasis adjacent to the gallbladder is mildly decreased. Small bilateral pulmonary nodules are all stable. 3. New small nonocclusive thrombosis in the main portal vein. Persistent near occlusion of the proximal main portal vein with narrowing of the portal splenic venous confluence. 4. Aortic Atherosclerosis (ICD10-I70.0).   11/02/2020 Imaging   EXAM: CT CHEST, ABDOMEN, AND PELVIS WITH CONTRAST  IMPRESSION: 1. Two new pulmonary nodules, suspicious for progressive metastatic disease. Other pulmonary nodules are similar. Given somewhat ill-defined morphology of the posterior right upper lobe new nodule, recommend clinical exclusion of mild infection, felt less likely. 2. New hypoattenuating lesion within the posterior right hepatic lobe, favoring metastatic disease. Given areas of presumed focal steatosis adjacent the gallbladder, rounded focal steatosis is possible but felt less likely. If imaged differentiation is desired, pre and post-contrast MRI or PET would likely be definitive. 3. The pancreatic primary is slightly decreased and less well-defined today. 4. Otherwise, no new sites of metastatic disease. 5.  Possible constipation. 6. Coronary artery atherosclerosis. Aortic Atherosclerosis (ICD10-I70.0).   11/10/2020 - 02/02/2021 Chemotherapy   Patient is on Treatment Plan :  PANCREATIC Abraxane / Gemcitabine D1,8,15 q28d     01/18/2021 Imaging   EXAM: CT CHEST, ABDOMEN, AND PELVIS WITH CONTRAST  IMPRESSION: 1. Today's study demonstrates positive response to therapy in terms of regression of numerous hypovascular hepatic lesions which likely represent hepatic metastases. 2. New pulmonary nodules seen on the prior study have regressed, favored to be indicative of benign infectious or inflammatory etiologies on the prior study. Old small pulmonary nodules are stable compared to the most recent prior examination, but new compared to more remote prior studies, likely to represent treated stable metastatic lesions. 3. The primary pancreatic mass continues to obstruct the main pancreatic duct, with worsening side branch ectasia throughout the body and tail of the pancreas and/or developing pancreatic pseudocysts, with evidence of pancreatitis in these regions, as above. 4. The primary pancreatic mass continues to cause severe narrowing of the superior mesenteric vein, splenoportal confluence and proximal portal vein (which is nearly completely occluded). Associated with this are portosystemic collateral pathways, most notable for large gastric varices, and mild splenomegaly. 5. Aortic atherosclerosis, in addition to left main and 2 vessel coronary artery disease. 6. Additional incidental findings, as above.      INTERVAL HISTORY:  EZEKEIL BETHEL is here for a follow up of pancreatic cancer.  Mr. San Jetty is here for follow-up since his hospital discharge.  He continues to deal with abdominal pain which is mostly in the left upper quadrant.  He rates the pain as 5 out of 10 with pain medication.  Without pain medication it is about 7 out of 10.  He has been taking OxyContin 30 mg  p.o. twice a day as well as Dilaudid 2 mg every 4 hours as needed for pain.  Wife mentions that he has been sleeping most of the day with this pain medication.  He has not been eating very  well, food makes his pain worse hence he has been trying to eat very selective foods.  He was able to tolerate liquid food better than solid food in the hospital.  Continues to deal with constipation, last bowel movement was the day of discharge, he continues to pass gas, no vomiting.  He takes MiraLAX daily.  He has some nausea likely secondary to pain and has been taking Compazine as needed. Overall he still feels very weak since the hospitalization and is hoping to recover soon to get back on treatment.   All other systems were reviewed with the patient and are negative.  MEDICAL HISTORY:  Past Medical History:  Diagnosis Date   Arthritis    Atypical chest pain 04/2017   ACS ruled out 05/11/17   CAP (community acquired pneumonia) 05/07/2017   Chronic constipation    at least partially d/t chronic opioids   Diabetes mellitus without complication ([place])    Poor control starting fall 2021-->GAD ab neg.  Insulin and C peptide levels wnl but on lower end of normal->Dr Kuma/endo 2022.   Exocrine pancreatic insufficiency 2022   History of thyroidectomy, total 2018   Hurlthe cell adenoma   Hx of adenomatous polyp of colon 07/02/2009   rpt colonoscopy 2026   Hyperlipidemia    Intol of multiple statins and zetia. [facility] lipid clinic 2022.   Hypertension    Lipoma    back   Myocardial infarction [facility])    MI at age 83   Pancreatic adenocarcinoma ([place]) 06/2020   Mets to liver (?and lungs?->stage 4), w/acute pancreatitis.   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 Cammy Copa   Right ureteral calculus 04/2018   Cystoscopy with right retrograde pyelogram and right ureteral stent placement after stone removal.   Seasonal allergies    Stroke ([place])    in 40J no complications     Tachyarrhythmia 1980   age 73 X 2 over 60 month period.  No recurrence since that time.   Thoracic aortic aneurysm    Incidentally discovered, 4.1 cm-->Dr. Cyndia Bent. Rpt CT 08/2018 and 08/2019 stable.   Vocal cord paralysis 07/2016   Left vocal cord paralysis noted after thyroid surgery.    SURGICAL HISTORY: Past Surgical History:  Procedure Laterality Date   BIOPSY  06/18/2020   Procedure: BIOPSY;  Surgeon: Mansouraty, Telford Nab., MD;  Location: [facility] ENDOSCOPY;  Service: Gastroenterology;;   [place] W/ POLYPECTOMY  07/02/2009; 01/24/20   2011 adenoma.  2021 adenomas x 3->recall 2026   CYSTOSCOPY WITH RETROGRADE PYELOGRAM, URETEROSCOPY AND STENT PLACEMENT Right 05/09/2018   Procedure: CYSTOSCOPY WITH RIGHT RETROGRADE RIGHT URETEROSCOPY STONE EXTRACTION RIGHT STENT EXCHANGE;  Surgeon: Lucas Mallow, MD;  Location: [facility] ORS;  Service: Urology;  Laterality: Right;   CYSTOSCOPY/RETROGRADE/URETEROSCOPY/STONE EXTRACTION WITH BASKET Right 04/30/2018   Procedure: CYSTOSCOPY/RETROGRADE/RIGHT URETEROSCOPY/;  Surgeon: Lucas Mallow, MD;  Location: [facility];  Service: Urology;  Laterality: Right;   ESOPHAGOGASTRODUODENOSCOPY  07/02/2009   NORMAL   ESOPHAGOGASTRODUODENOSCOPY (EGD) WITH PROPOFOL N/A  06/18/2020   Procedure: ESOPHAGOGASTRODUODENOSCOPY (EGD) WITH PROPOFOL;  Surgeon: Rush Landmark Telford Nab., MD;  Location: [place];  Service: Gastroenterology;  Laterality: N/A;   EYE SURGERY     FINE NEEDLE ASPIRATION  06/18/2020   Procedure: FINE NEEDLE ASPIRATION (FNA) LINEAR;  Surgeon: Irving Copas., MD;  Location: [place];  Service: Gastroenterology;;   finger amputaion     with reattachement   INCISION AND DRAINAGE ABSCESS ANAL  11/23/2019   gen surg (In ED).   IR IMAGING GUIDED PORT INSERTION  06/22/2020   LASIK     THYROIDECTOMY N/A 08/03/2016   Procedure: Total THYROIDECTOMY;  Surgeon: [name], MD;  Location: [place];  Service: ENT;  Laterality:  N/A;  Total Thyroidectomy    UPPER ESOPHAGEAL ENDOSCOPIC ULTRASOUND (EUS) N/A 06/18/2020   Procedure: UPPER ESOPHAGEAL ENDOSCOPIC ULTRASOUND (EUS);  Surgeon: Irving Copas., MD;  Location: [place];  Service: Gastroenterology;  Laterality: N/A;   UPPER GASTROINTESTINAL ENDOSCOPY     with EUS and bx of pancreatic mass. +appearance of candida in esoph.      I have reviewed the social history and family history with the patient and they are unchanged from previous note.  ALLERGIES:  is allergic to atenolol, hydroxyzine, tetracycline, atorvastatin, codeine, guaifenesin, and mucinex [guaifenesin er].  MEDICATIONS:  Current Outpatient Medications  Medication Sig Dispense Refill   oxyCODONE 30 MG 12 hr tablet Take by mouth in the morning and at bedtime.     amLODipine (NORVASC) 10 MG tablet Take 1 tablet (10 mg total) by mouth daily. 30 tablet 3   apixaban (ELIQUIS) 5 MG TABS tablet TAKE 1 TABLET(5 MG) BY MOUTH TWICE DAILY (Patient taking differently: Take 5 mg by mouth 2 (two) times daily.) 60 tablet 1   Continuous Blood Gluc Sensor (FREESTYLE LIBRE 2 SENSOR) MISC APPLY 1 SENSOR TO THE SKIN EVERY 14 DAYS AS DIRECTED 2 each 3   docusate sodium (COLACE) 100 MG capsule Take 100 mg by mouth 2 (two) times daily.     gabapentin (NEURONTIN) 300 MG capsule Take 1 capsule (300 mg total) by mouth at bedtime. 30 capsule 0   HUMALOG KWIKPEN 100 UNIT/ML KwikPen INJECT SUBCUTANEOUSLY UP TO 12 UNITS BEFORE MEALS 3  TIMES DAILY 45 mL 3   HYDROmorphone (DILAUDID) 2 MG tablet Take 1 tablet (2 mg total) by mouth every 6 (six) hours as needed for severe pain. 90 tablet 0   insulin glargine (LANTUS SOLOSTAR) 100 UNIT/ML Solostar Pen Give up to 10 units under the skin every evening at bedtime. 15 mL 1   Insulin Pen Needle 31G X 5 MM MISC Use with insulin pen 3 times a day 100 each 1   lactulose (CHRONULAC) 10 GM/15ML solution Take 15 mLs (10 g total) by mouth 3 (three) times daily as needed for mild  constipation. 236 mL 0   levothyroxine (SYNTHROID) 137 MCG tablet TAKE 1 TABLET BY MOUTH  DAILY BEFORE BREAKFAST (Patient taking differently: Take 137 mcg by mouth daily before breakfast.) 90 tablet 3   lipase/protease/amylase (CREON) 36000 UNITS CPEP capsule Take 2 capsules (72,000 Units total) by mouth 3 (three) times daily with meals. May also take 1 capsule (36,000 Units total) as needed (with snacks). (Patient not taking: Reported on 02/12/2021) 240 capsule 11   LORazepam (ATIVAN) 0.5 MG tablet TAKE 1 TABLET(0.5 MG) BY MOUTH EVERY 12 HOURS AS NEEDED FOR ANXIETY (Patient taking differently: Take 0.5 mg by mouth every 12 (twelve) hours as needed for anxiety.) 60 tablet 0  magnesium hydroxide (MILK OF MAGNESIA) 400 MG/5ML suspension Take by mouth daily as needed for mild constipation.     polyethylene glycol (MIRALAX / GLYCOLAX) 17 g packet Take 17 g by mouth daily.     prochlorperazine (COMPAZINE) 10 MG tablet TAKE 1 TABLET(10 MG) BY MOUTH EVERY 6 HOURS AS NEEDED FOR NAUSEA OR VOMITING (Patient taking differently: Take 10 mg by mouth every 6 (six) hours as needed. TAKE 1 TABLET(10 MG) BY MOUTH EVERY 6 HOURS AS NEEDED FOR NAUSEA OR VOMITING) 30 tablet 1   No current facility-administered medications for this visit.    PHYSICAL EXAMINATION: ECOG PERFORMANCE STATUS: 2 - Symptomatic, <50% confined to bed  Vitals:   03/01/21 1007  BP: 121/75  Pulse: 100  Resp: 18  Temp: 97.9 F (36.6 C)  SpO2: 99%   Wt Readings from Last 3 Encounters:  03/01/21 174 lb 9.6 oz (79.2 kg)  02/12/21 179 lb 11.2 oz (81.5 kg)  02/12/21 179 lb 11.2 oz (81.5 kg)     GENERAL:alert, no distress and comfortable SKIN: skin color, texture, turgor are normal, no rashes or significant lesions Mild: Moist mucosa EYES: normal, Conjunctiva are pink and non-injected, sclera clear Chest: Clear to auscultation bilaterally, heart rate and rhythm regular ABDOMEN:abdomen soft, and normal bowel sounds, (+) tenderness to left  upper quadrant and mid abdomen. NEURO: alert & oriented x 3 with fluent speech, no focal motor/sensory deficits  LABORATORY DATA:  I have reviewed the data as listed CBC Latest Ref Rng & Units 02/24/2021 02/23/2021 02/21/2021  WBC 4.0 - 10.5 K/uL 3.9(L) 4.4 3.7(L)  Hemoglobin 13.0 - 17.0 g/dL 11.2(L) 11.1(L) 10.6(L)  Hematocrit 39.0 - 52.0 % 32.9(L) 32.7(L) 31.3(L)  Platelets 150 - 400 K/uL 241 246 205     CMP Latest Ref Rng & Units 02/24/2021 02/23/2021 02/21/2021  Glucose 70 - 99 mg/dL 170(H) 174(H) 175(H)  BUN 8 - 23 mg/dL '13 11 9  ' Creatinine 0.61 - 1.24 mg/dL 0.74 0.64 0.87  Sodium 135 - 145 mmol/L 138 139 138  Potassium 3.5 - 5.1 mmol/L 3.4(L) 3.7 3.4(L)  Chloride 98 - 111 mmol/L 101 103 103  CO2 22 - 32 mmol/L '26 26 24  ' Calcium 8.9 - 10.3 mg/dL 9.2 8.7(L) 9.2  Total Protein 6.5 - 8.1 g/dL - - -  Total Bilirubin 0.3 - 1.2 mg/dL - - -  Alkaline Phos 38 - 126 U/L - - -  AST 15 - 41 U/L - - -  ALT 0 - 44 U/L - - -    RADIOGRAPHIC STUDIES: I have personally reviewed the radiological images as listed and agreed with the findings in the report.  Reviewed pertinent imaging reports from recent hospitalization.  All questions were answered. The patient knows to call the clinic with any problems, questions or concerns. No barriers to learning was detected. The total time spent in the appointment was 40 minutes.     [name], MD 03/01/2021

## 2021-03-01 NOTE — Progress Notes (Signed)
[place]  [phone] [phone]   Name: Timothy Page Date: 03/01/2021 MRN: [id number]  DOB: Jul 10, 1955  Patient Care Team: [name], MD as PCP - General (Hematology and Oncology) Gatha Mayer, MD as Consulting Physician (Gastroenterology) [name], MD as Consulting Physician (Otolaryngology) Elayne Snare, MD as Consulting Physician (Endocrinology) Gaye Pollack, MD as Consulting Physician (Cardiothoracic Surgery) Lucas Mallow, MD as Consulting Physician (Urology) Debara Pickett Nadean Corwin, MD as Consulting Physician (Cardiology) [name], MD as Consulting Physician (Hematology and Oncology) Jonnie Finner, RN (Inactive) as Oncology Nurse Navigator Pickenpack-Cousar, Carlena Sax, NP as Nurse Practitioner (Nurse Practitioner)    INTERVAL HISTORY: Timothy Page is a 66 y.o. male with stage IV pancreatic cancer with mets to the lung and liver (currently on second line treatment with gemcitabine and Xeloda), chemo induced neuropathy, diabetes, hypothyroidism, and anxiety.  Palliative ask to see for symptom management and goals of care.     SOCIAL HISTORY:     reports that he has never smoked. His smokeless tobacco use includes snuff. He reports that he does not drink alcohol and does not use drugs.  ADVANCE DIRECTIVES:    CODE STATUS: DNR  PAST MEDICAL HISTORY: Past Medical History:  Diagnosis Date   Arthritis    Atypical chest pain 04/2017   ACS ruled out 05/11/17   CAP (community acquired pneumonia) 05/07/2017   Chronic constipation    at least partially d/t chronic opioids   Diabetes mellitus without complication ([place])    Poor control starting fall 2021-->GAD ab neg.  Insulin and C peptide levels wnl but on lower end of normal->Dr Kuma/endo 2022.   Exocrine pancreatic insufficiency 2022   History of thyroidectomy, total 2018   Hurlthe cell adenoma   Hx of adenomatous polyp of colon  07/02/2009   rpt colonoscopy 2026   Hyperlipidemia    Intol of multiple statins and zetia. [facility] 2022.   Hypertension    Lipoma    back   Myocardial infarction [facility])    MI at age 50   Pancreatic adenocarcinoma ([place]) 06/2020   Mets to liver (?and lungs?->stage 4), w/acute pancreatitis.   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 Cammy Copa   Right ureteral calculus 04/2018   Cystoscopy with right retrograde pyelogram and right ureteral stent placement after stone removal.   Seasonal allergies    Stroke ([place])    in 15V no complications    Tachyarrhythmia 1980   age 62 X 2 over 63 month period.  No recurrence since that time.   Thoracic aortic aneurysm    Incidentally discovered, 4.1 cm-->Dr. Cyndia Bent. Rpt CT 08/2018 and 08/2019 stable.   Vocal cord paralysis 07/2016   Left vocal cord paralysis noted after thyroid surgery.    ALLERGIES:  is allergic to atenolol, hydroxyzine, tetracycline, atorvastatin, codeine, guaifenesin, and mucinex [guaifenesin er].  MEDICATIONS:  Current Outpatient Medications  Medication Sig Dispense Refill   morphine (MS CONTIN) 15 MG 12 hr tablet Take 1 tablet (15 mg total) by mouth every 12 (twelve) hours. 60 tablet 0   sorbitol 70 % solution Take 15 mLs by mouth daily as needed (for constipation). 473 mL 0   amLODipine (NORVASC) 10 MG tablet  Take 1 tablet (10 mg total) by mouth daily. 30 tablet 3   apixaban (ELIQUIS) 5 MG TABS tablet TAKE 1 TABLET(5 MG) BY MOUTH TWICE DAILY (Patient taking differently: Take 5 mg by mouth 2 (two) times daily.) 60 tablet 1   Continuous Blood Gluc Sensor (FREESTYLE LIBRE 2 SENSOR) MISC APPLY 1 SENSOR TO THE SKIN EVERY 14 DAYS AS DIRECTED 2 each 3   docusate sodium (COLACE) 100 MG capsule Take 100 mg  by mouth 2 (two) times daily.     gabapentin (NEURONTIN) 300 MG capsule Take 1 capsule (300 mg total) by mouth at bedtime. 30 capsule 0   HUMALOG KWIKPEN 100 UNIT/ML KwikPen INJECT SUBCUTANEOUSLY UP TO 12 UNITS BEFORE MEALS 3  TIMES DAILY 45 mL 3   HYDROmorphone (DILAUDID) 2 MG tablet Take 1 tablet (2 mg total) by mouth every 6 (six) hours as needed for severe pain. 90 tablet 0   insulin glargine (LANTUS SOLOSTAR) 100 UNIT/ML Solostar Pen Give up to 10 units under the skin every evening at bedtime. 15 mL 1   Insulin Pen Needle 31G X 5 MM MISC Use with insulin pen 3 times a day 100 each 1   levothyroxine (SYNTHROID) 137 MCG tablet TAKE 1 TABLET BY MOUTH  DAILY BEFORE BREAKFAST (Patient taking differently: Take 137 mcg by mouth daily before breakfast.) 90 tablet 3   lipase/protease/amylase (CREON) 36000 UNITS CPEP capsule Take 2 capsules (72,000 Units total) by mouth 3 (three) times daily with meals. May also take 1 capsule (36,000 Units total) as needed (with snacks). (Patient not taking: Reported on 02/12/2021) 240 capsule 11   LORazepam (ATIVAN) 0.5 MG tablet TAKE 1 TABLET(0.5 MG) BY MOUTH EVERY 12 HOURS AS NEEDED FOR ANXIETY (Patient taking differently: Take 0.5 mg by mouth every 12 (twelve) hours as needed for anxiety.) 60 tablet 0   magnesium hydroxide (MILK OF MAGNESIA) 400 MG/5ML suspension Take by mouth daily as needed for mild constipation.     polyethylene glycol (MIRALAX / GLYCOLAX) 17 g packet Take 17 g by mouth daily.     prochlorperazine (COMPAZINE) 10 MG tablet TAKE 1 TABLET(10 MG) BY MOUTH EVERY 6 HOURS AS NEEDED FOR NAUSEA OR VOMITING (Patient taking differently: Take 10 mg by mouth every 6 (six) hours as needed. TAKE 1 TABLET(10 MG) BY MOUTH EVERY 6 HOURS AS NEEDED FOR NAUSEA OR VOMITING) 30 tablet 1   No current facility-administered medications for this visit.    VITAL SIGNS: There were no vitals taken for this visit. There were no vitals filed for this visit.  Estimated body  mass index is 25.78 kg/m as calculated from the following:   Height as of 02/12/21: 5\' 9"  (1.753 m).   Weight as of an earlier encounter on 03/01/21: 174 lb 9.6 oz (79.2 kg).   PERFORMANCE STATUS (ECOG) : 1 - Symptomatic but completely ambulatory   Physical Exam General: NAD Cardiovascular: regular rate and rhythm Pulmonary: clear ant fields Abdomen: soft,  LUQ tenderness, + bowel sounds GU: no suprapubic tenderness Extremities: no edema, no joint deformities Neurological: AAO x3  IMPRESSION:  Timothy Page presents to clinic today with his wife after recent hospitalization. He is ambulatory. Reports pain is much better controlled which he is appreciative of. Is able to perform ADLs and doe some things around the home. He does however have concerns of increased fatigue/sleeping throughout the day. He would like to get back to a point he is functional and able to do some work but also with effective  pain relief. We discussed current pain regimen at length.   Neoplasm related pain I was actively following patient while hospitalized.  At that time adjustments was made to his pain regimen.  Pain seems effectively managed OxyContin 30 mg twice daily, Dilaudid 2 mg every 4-6 hours as needed pain, and gabapentin 300 at bedtime.  We discussed decreasing OxyContin to 20 mg to allow for better quality of life and ability to function throughout the day.  He reports significantly improved would also consider restarting MS Contin versus OxyContin.  We also discussed despite insurance approval after speaking with the pharmacist medication is still little high because that would not be sustainable for him long-term.  He would like to transition back to MS Contin with plans to continue gabapentin and Dilaudid as needed.  Education provided that he is unable to drive when taking Dilaudid experiencing any drowsiness due to opioid use.  He verbalized understanding sharing he has not had to take any breakthrough  medications today which is a great improvement.  Constipation  He currently still with some constipation.  During recent hospitalization sorbitol was effective. Advised to continue MiraLAX daily with sorbitol being available.  He verbalized understanding and appreciation.  Timothy Page and his wife are remaining hopeful for continued stability and increase in his quality of life.  He is looking forward to restarting treatment in a couple of weeks of all continues to go well.    I discussed the importance of continued conversation with family and their medical providers regarding overall plan of care and treatment options, ensuring decisions are within the context of the patients values and GOCs.  PLAN: Discontinue OxyContin MS Contin 15 mg ER twice daily.  If pain continues to be effectively managed may consider nightly versus twice daily. Continue Dilaudid 2 mg every 6 hours as needed for breakthrough pain.  Education provided on operation of private vehicle when taking medication. Sorbitol as needed for constipation.  Continue daily MiraLAX. We will plan to see patient back in 1-2 weeks in collaboration with his other oncology appointments.  Patient expressed understanding and was in agreement with this plan. He also understands that He can call the clinic at any time with any questions, concerns, or complaints.   Time Total: 40 min  Visit consisted of counseling and education dealing with the complex and emotionally intense issues of symptom management and palliative care in the setting of serious and potentially life-threatening illness.Greater than 50%  of this time was spent counseling and coordinating care related to the above assessment and plan.  Signed by: Alda Lea, AGPCNP-BC [place]

## 2021-03-02 ENCOUNTER — Other Ambulatory Visit: Payer: Self-pay | Admitting: Hematology

## 2021-03-02 ENCOUNTER — Other Ambulatory Visit: Payer: Self-pay | Admitting: Hematology and Oncology

## 2021-03-02 DIAGNOSIS — R7401 Elevation of levels of liver transaminase levels: Secondary | ICD-10-CM

## 2021-03-02 DIAGNOSIS — C259 Malignant neoplasm of pancreas, unspecified: Secondary | ICD-10-CM

## 2021-03-02 LAB — CANCER ANTIGEN 19-9: CA 19-9: 128 U/mL — ABNORMAL HIGH (ref 0–35)

## 2021-03-02 NOTE — Progress Notes (Signed)
Ordered MRI abdomen per Dr Celesta Aver advice given elevated LFT.  Timothy Page

## 2021-03-03 ENCOUNTER — Encounter: Payer: Self-pay | Admitting: Hematology and Oncology

## 2021-03-03 ENCOUNTER — Other Ambulatory Visit (HOSPITAL_COMMUNITY): Payer: Self-pay

## 2021-03-03 ENCOUNTER — Telehealth: Payer: Self-pay | Admitting: *Deleted

## 2021-03-03 ENCOUNTER — Other Ambulatory Visit: Payer: Self-pay | Admitting: Nurse Practitioner

## 2021-03-03 MED ORDER — SORBITOL 70 % SOLN
15.0000 mL | Freq: Every day | 0 refills | Status: AC | PRN
Start: 2021-03-03 — End: ?
  Filled 2021-03-03: qty 473, 31d supply, fill #0

## 2021-03-03 NOTE — Telephone Encounter (Addendum)
This RN called pt to inform and obtained identified VM- message left per need for scan with this RN's name for return call. Message sent to PA staff for auth for scan to be scheduled.   ----- Message from [name], MD sent at 03/02/2021  5:38 PM EST ----- Val,  Can you please call the patient and let him know that I am ordering an MRI/MRCP of the liver because his labs show elevated LFT. Can you also help scheduled this, high priority.  Thanks, ----- Message ----- From: Gatha Mayer, MD Sent: 03/02/2021   4:34 PM EST To: [name], MD  I had responded earlier.    I would do an MRI/MRCP of the liver I am suspicious the CT is not accurate or edema from the pancreatitis or mass-effect is now causing biliary obstruction  Glendell Docker   ----- Message ----- From: [name], MD Sent: 03/02/2021   3:54 PM EST To: Gatha Mayer, MD, Irene Shipper, MD  Dr Carlean Purl  This is a patient of yours who we are seeing for metastatic pancreatic cancer, recently admitted with acute pancreatitis, He now has LFT elevation, no evidence of any hepatic disease or biliary dilatation on most recent imaging. I was wondering if you can guide [place] what could be causing his transaminitis.  Thanks so much.  Thanks Dr Henrene Pastor, sorry to bother you. ----- Message ----- From: Irene Shipper, MD Sent: 03/02/2021   2:00 PM EST To: Gatha Mayer, MD, [name], MD  Praveena,  Actually, I have never seen this patient.  Just left a quick progress note when we were reconsulted on a Sunday afternoon, that we would see patient the next day.  In any event, primary patient of Dr. Carlean Purl.  I will forward to him.  Thanks,  John  ----- Message ----- From: [name], MD Sent: 03/02/2021  12:46 PM EST To: Irene Shipper, MD  Dr Henrene Pastor,  This is our patient with metastatic pancreatic cancer recently seen by you in the hospital.He has this new transaminitis, most recent CT didn't show much going on in  the liver. I was just wondering if you can help me decipher the results  Thanks,

## 2021-03-04 ENCOUNTER — Other Ambulatory Visit: Payer: Self-pay | Admitting: Hematology

## 2021-03-04 ENCOUNTER — Other Ambulatory Visit: Payer: Self-pay | Admitting: *Deleted

## 2021-03-04 ENCOUNTER — Encounter: Payer: Self-pay | Admitting: Hematology and Oncology

## 2021-03-04 DIAGNOSIS — C250 Malignant neoplasm of head of pancreas: Secondary | ICD-10-CM

## 2021-03-04 NOTE — Telephone Encounter (Signed)
Signed 1 week ago. Gardiner Rhyme, RN

## 2021-03-05 ENCOUNTER — Other Ambulatory Visit: Payer: Self-pay | Admitting: Hematology and Oncology

## 2021-03-05 ENCOUNTER — Inpatient Hospital Stay: Payer: 59

## 2021-03-05 ENCOUNTER — Ambulatory Visit (HOSPITAL_COMMUNITY)
Admission: RE | Admit: 2021-03-05 | Discharge: 2021-03-05 | Disposition: A | Discharge status: Home / Self Care | Payer: 59 | Source: Ambulatory Visit | Attending: Hematology and Oncology | Admitting: Hematology and Oncology

## 2021-03-05 ENCOUNTER — Other Ambulatory Visit: Payer: Self-pay

## 2021-03-05 DIAGNOSIS — C250 Malignant neoplasm of head of pancreas: Secondary | ICD-10-CM

## 2021-03-05 MED ORDER — GADOBUTROL 1 MMOL/ML IV SOLN
7.5000 mL | Freq: Once | INTRAVENOUS | Status: AC | PRN
  Administered 2021-03-05: 7.5 mL via INTRAVENOUS

## 2021-03-08 ENCOUNTER — Inpatient Hospital Stay: Payer: 59

## 2021-03-08 ENCOUNTER — Other Ambulatory Visit: Payer: Self-pay

## 2021-03-08 ENCOUNTER — Other Ambulatory Visit (HOSPITAL_COMMUNITY): Payer: Self-pay

## 2021-03-08 ENCOUNTER — Encounter: Payer: Self-pay | Admitting: Hematology and Oncology

## 2021-03-08 ENCOUNTER — Inpatient Hospital Stay (HOSPITAL_BASED_OUTPATIENT_CLINIC_OR_DEPARTMENT_OTHER): Payer: 59 | Admitting: Hematology and Oncology

## 2021-03-08 ENCOUNTER — Encounter: Payer: Self-pay | Admitting: Nurse Practitioner

## 2021-03-08 ENCOUNTER — Inpatient Hospital Stay (HOSPITAL_BASED_OUTPATIENT_CLINIC_OR_DEPARTMENT_OTHER): Payer: 59 | Admitting: Nurse Practitioner

## 2021-03-08 VITALS — BP 131/87 | HR 102 | Temp 97.8°F | Resp 18 | Wt 164.1 lb

## 2021-03-08 DIAGNOSIS — R531 Weakness: Secondary | ICD-10-CM

## 2021-03-08 DIAGNOSIS — C251 Malignant neoplasm of body of pancreas: Secondary | ICD-10-CM

## 2021-03-08 DIAGNOSIS — G893 Neoplasm related pain (acute) (chronic): Secondary | ICD-10-CM

## 2021-03-08 DIAGNOSIS — C259 Malignant neoplasm of pancreas, unspecified: Secondary | ICD-10-CM

## 2021-03-08 DIAGNOSIS — Z7189 Other specified counseling: Secondary | ICD-10-CM | POA: Diagnosis not present

## 2021-03-08 DIAGNOSIS — R634 Abnormal weight loss: Secondary | ICD-10-CM

## 2021-03-08 DIAGNOSIS — R17 Unspecified jaundice: Secondary | ICD-10-CM

## 2021-03-08 DIAGNOSIS — Z515 Encounter for palliative care: Secondary | ICD-10-CM

## 2021-03-08 DIAGNOSIS — C250 Malignant neoplasm of head of pancreas: Secondary | ICD-10-CM

## 2021-03-08 DIAGNOSIS — R110 Nausea: Secondary | ICD-10-CM | POA: Diagnosis not present

## 2021-03-08 DIAGNOSIS — Z66 Do not resuscitate: Secondary | ICD-10-CM

## 2021-03-08 LAB — CMP (CANCER CENTER ONLY)
ALT: 273 U/L — ABNORMAL HIGH (ref 0–44)
AST: 173 U/L (ref 15–41)
Albumin: 4.1 g/dL (ref 3.5–5.0)
Alkaline Phosphatase: 386 U/L — ABNORMAL HIGH (ref 38–126)
Anion gap: 13 (ref 5–15)
BUN: 24 mg/dL — ABNORMAL HIGH (ref 8–23)
CO2: 23 mmol/L (ref 22–32)
Calcium: 9.6 mg/dL (ref 8.9–10.3)
Chloride: 98 mmol/L (ref 98–111)
Creatinine: 0.97 mg/dL (ref 0.61–1.24)
GFR, Estimated: >60 mL/min (ref >60)
Glucose, Bld: 173 mg/dL — ABNORMAL HIGH (ref 70–99)
Potassium: 4 mmol/L (ref 3.5–5.1)
Sodium: 134 mmol/L — ABNORMAL LOW (ref 135–145)
Total Bilirubin: 7.7 mg/dL (ref 0.3–1.2)
Total Protein: 8 g/dL (ref 6.5–8.1)

## 2021-03-08 LAB — CBC WITH DIFFERENTIAL (CANCER CENTER ONLY)
Abs Immature Granulocytes: 0.01 10*3/uL (ref 0.00–0.07)
Basophils Absolute: 0.1 10*3/uL (ref 0.0–0.1)
Basophils Relative: 2 %
Eosinophils Absolute: 0.1 10*3/uL (ref 0.0–0.5)
Eosinophils Relative: 2 %
HCT: 37.3 % — ABNORMAL LOW (ref 39.0–52.0)
Hemoglobin: 12.8 g/dL — ABNORMAL LOW (ref 13.0–17.0)
Immature Granulocytes: 0 %
Lymphocytes Relative: 34 %
Lymphs Abs: 0.9 10*3/uL (ref 0.7–4.0)
MCH: 30.5 pg (ref 26.0–34.0)
MCHC: 34.3 g/dL (ref 30.0–36.0)
MCV: 89 fL (ref 80.0–100.0)
Monocytes Absolute: 0.6 10*3/uL (ref 0.1–1.0)
Monocytes Relative: 21 %
Neutro Abs: 1 10*3/uL — ABNORMAL LOW (ref 1.7–7.7)
Neutrophils Relative %: 41 %
Platelet Count: 172 10*3/uL (ref 150–400)
RBC: 4.19 MIL/uL — ABNORMAL LOW (ref 4.22–5.81)
RDW: 13.7 % (ref 11.5–15.5)
WBC Count: 2.6 10*3/uL — ABNORMAL LOW (ref 4.0–10.5)
nRBC: 0 % (ref 0.0–0.2)

## 2021-03-08 MED ORDER — HYDROMORPHONE HCL 1 MG/ML IJ SOLN: 1.0000 mg | INTRAMUSCULAR | Status: DC | PRN

## 2021-03-08 MED ORDER — HYDROMORPHONE HCL 1 MG/ML IJ SOLN
1.0000 mg | INTRAMUSCULAR | Status: AC | PRN
  Administered 2021-03-08: 1 mg via INTRAVENOUS

## 2021-03-08 MED ORDER — MORPHINE SULFATE ER 15 MG PO TBCR: 30.0000 mg | EXTENDED_RELEASE_TABLET | Freq: Two times a day (BID) | ORAL | 0 refills | Status: AC

## 2021-03-08 MED ORDER — SODIUM CHLORIDE 0.9 % IV SOLN: INTRAVENOUS | Status: DC

## 2021-03-08 MED ORDER — OXYCODONE HCL 5 MG PO TABS
5.0000 mg | ORAL_TABLET | ORAL | 0 refills | Status: AC | PRN
  Filled 2021-03-08: qty 120, 10d supply, fill #0

## 2021-03-08 MED ORDER — HYDROMORPHONE HCL 1 MG/ML IJ SOLN
2.0000 mg | Freq: Once | INTRAMUSCULAR | Status: AC
  Administered 2021-03-08: 2 mg via INTRAVENOUS

## 2021-03-08 MED ORDER — SODIUM CHLORIDE 0.9 % IV SOLN: INTRAVENOUS | Status: AC

## 2021-03-08 NOTE — Progress Notes (Unsigned)
CRITICAL TBIL 7.7 AND AST 173 CALLED TO VAL TODD AT 1044 BY PAM Mathew Storck

## 2021-03-08 NOTE — Patient Instructions (Signed)
Today we discussed lab results and goals of care:  Per discussions with myself and Dr. Chryl Heck recommendation for outpatient hospice support. We will continue to be here to support you and assist with any symptom management needs. You should receive a call from [facility] to provide education on their services.   We discussed the following medication changes:  Increase MS Contin (long-acting) to 30 mg twice daily  May take Oxycodone IR as needed for breakthrough pain every 3-4 hours. Take 1-2 tablets as needed (5-10 mg).  Please give [place] a call or send message with any further needs. [phone].

## 2021-03-08 NOTE — Progress Notes (Signed)
[place]   [phone]) [phone] Fax:(336) [phone]   Clinic Follow up Note   Patient Care Team: [name], MD as PCP - General (Hematology and Oncology) Gatha Mayer, MD as Consulting Physician (Gastroenterology) [name], MD as Consulting Physician (Otolaryngology) Elayne Snare, MD as Consulting Physician (Endocrinology) Gaye Pollack, MD as Consulting Physician (Cardiothoracic Surgery) Lucas Mallow, MD as Consulting Physician (Urology) Debara Pickett Nadean Corwin, MD as Consulting Physician (Cardiology) [name], MD as Consulting Physician (Hematology and Oncology) Jonnie Finner, RN (Inactive) as Oncology Nurse Navigator Pickenpack-Cousar, Carlena Sax, NP as Nurse Practitioner (Nurse Practitioner)  Date of Service:  03/08/2021  CHIEF COMPLAINT: f/u of pancreatic cancer  CURRENT THERAPY:  Second-line gemcitabine/abraxane, started 11/10/20 -Abraxane held after 12/08/20 dose d/t neuropathy -Added xeloda to gemzar from 12/19, patient admitted with pancreatitis.  ASSESSMENT & PLAN:  Timothy Page is a 66 y.o. male with   1. Pancreatic adenocarcinoma ([place]), stage IV with lung and liver metastasis  -on second line gemcitabine and abraxane -due to worsening neuropathy, I will reduce Abraxane dose from 100 mg to 50 mg/m2, and continue gemcitabine at same dose -tumor marker CA 19.9 showed drop to 23 on 12/22/20 -restaging CT CAP 01/18/21 showed overall positive response to therapy.  -He was started on Xeloda and gemcitabine given worsening neuropathy from Abraxane and was admitted with acute pancreatitis recently.  He continued to deteriorate during hospitalization and after discharge with worsening pain and started to have elevated liver function tests.  We have repeated his MRI/MRCP after discussion with gastroenterology which again suggested ongoing pancreatitis, increase in size of the primary pancreatic mass, small lesions in the liver concerning  for metastasis, development of biliary duct dilatation since prior MRI presumably secondary to pancreatic head primary.  On labs today, his bilirubin was noted to be 7.7 and he was visibly icteric.  I have called Dr. Jobe Igo and discussed if there is biliary ductal dilatation could be amenable to stenting which he did not think was possible but suggested talking to interventional radiology.   I have left a couple messages for interventional radiology and awaiting recommendations. Unfortunately he continues to clinically deteriorate with worsening pain, biliary obstruction and although single agent gemcitabine can be used, unfortunately this might not prolong his life much given worsening performance status and progressive tumor.  We have discussed that he is not a candidate for immunotherapy or other targeted therapy at this time.  I have discussed goals of care with the patient today.  We have encouraged considering comfort care and hospice since there are no excellent treatment options and since his performance status has deteriorated.  He understands that even if we were to try single agent Gemzar, this may not be effective for very long.  Wife and husband are both in agreement with the comfort measures at this time.  We will continue to be available with any questions or concerns.  2. Cancer related pain -Currently managed by palliative care.  This has continued to worsen since our last conversation.   3. Neuropathy due to chemotherapeutic drug ([place]), G2 -Secondary to oxaliplatin and Abraxane -He is taking gabapentin 300 mg po QHS.  Last up   4. weight loss, 10 pounds since last visit according to wife.  He has not been able to eat much nor even drink water because of the severe pain.  5. Nausea,  has been taking compazine PRN.  #6.  Goals of care, see above discussion.  Plan As mentioned above   No problem-specific Assessment & Plan notes found for this encounter.   SUMMARY OF  ONCOLOGIC HISTORY: Oncology History  Malignant neoplasm of pancreas ([place])  06/20/2020 Initial Diagnosis   Adenocarcinoma of pancreas, stage 4 ([place])   07/01/2020 - 10/21/2020 Chemotherapy          07/01/2020 Genetic Testing   Negative genetic testing on a custom panel reviewing multicancer and pancreatitis genes.  The Custom-Gene Panel offered by [facility] includes sequencing and/or deletion duplication testing of the following 91 genes: AIP, ALK, APC*, ATM*, AXIN2, BAP1, BARD1, BLM, BMPR1A, BRCA1, BRCA2, BRIP1, CASR, CDC73, CDH1, CDK4, CDKN1B, CDKN1C, CDKN2A (p14ARF), CDKN2A (p16INK4a), CEBPA, CFTR*, CHEK2, CPA1, CTNNA1, CTRC, DICER1*, DIS3L2, EGFR, EPCAM*, FANCC, FH*, FLCN, GATA2, GPC3*, GREM1*, HOXB13, HRAS, KIT, MAX*, MEN1*, MET*, MITF*, MLH1*, MSH2*, MSH3*, MSH6*, MUTYH, NBN, NF1*, NF2, NTHL1, PALB2, PALLD, PDGFRA, PHOX2B*, PMS2*, POLD1*, POLE, POT1, PRKAR1A, PRSS1*, PTCH1, PTEN*, RAD50, RAD51C, RAD51D, RB1*, RECQL4*, RET, RUNX1, SDHA*, SDHAF2, SDHB, SDHC*, SDHD, SMAD4, SMARCA4, SMARCB1, SMARCE1, SPINK1, STK11, SUFU, TERC, TERT, TMEM127, TP53, TSC1*, TSC2, VHL, WRN*, and WT1.  The report date is Jul 01, 2020.   08/26/2020 Imaging   Imaging after 4 cycles of therapy  IMPRESSION: 1. Pancreatic neck mass is stable. 2. No new or progressive metastatic disease. Tiny soft tissue nodule along the posteroinferior right liver capsule is stable. Solitary liver metastasis adjacent to the gallbladder is mildly decreased. Small bilateral pulmonary nodules are all stable. 3. New small nonocclusive thrombosis in the main portal vein. Persistent near occlusion of the proximal main portal vein with narrowing of the portal splenic venous confluence. 4. Aortic Atherosclerosis (ICD10-I70.0).   11/02/2020 Imaging   EXAM: CT CHEST, ABDOMEN, AND PELVIS WITH CONTRAST  IMPRESSION: 1. Two new pulmonary nodules, suspicious for progressive metastatic disease. Other pulmonary nodules are similar. Given somewhat  ill-defined morphology of the posterior right upper lobe new nodule, recommend clinical exclusion of mild infection, felt less likely. 2. New hypoattenuating lesion within the posterior right hepatic lobe, favoring metastatic disease. Given areas of presumed focal steatosis adjacent the gallbladder, rounded focal steatosis is possible but felt less likely. If imaged differentiation is desired, pre and post-contrast MRI or PET would likely be definitive. 3. The pancreatic primary is slightly decreased and less well-defined today. 4. Otherwise, no new sites of metastatic disease. 5.  Possible constipation. 6. Coronary artery atherosclerosis. Aortic Atherosclerosis (ICD10-I70.0).   11/10/2020 - 02/02/2021 Chemotherapy   Patient is on Treatment Plan : PANCREATIC Abraxane / Gemcitabine D1,8,15 q28d     01/18/2021 Imaging   EXAM: CT CHEST, ABDOMEN, AND PELVIS WITH CONTRAST  IMPRESSION: 1. Today's study demonstrates positive response to therapy in terms of regression of numerous hypovascular hepatic lesions which likely represent hepatic metastases. 2. New pulmonary nodules seen on the prior study have regressed, favored to be indicative of benign infectious or inflammatory etiologies on the prior study. Old small pulmonary nodules are stable compared to the most recent prior examination, but new compared to more remote prior studies, likely to represent treated stable metastatic lesions. 3. The primary pancreatic mass continues to obstruct the main pancreatic duct, with worsening side branch ectasia throughout the body and tail of the pancreas and/or developing pancreatic pseudocysts, with evidence of pancreatitis in these regions, as above. 4. The primary pancreatic mass continues to cause severe narrowing of the superior mesenteric vein, splenoportal confluence and proximal portal vein (which is nearly completely occluded). Associated with this are portosystemic collateral pathways,  most notable for  large gastric varices, and mild splenomegaly. 5. Aortic atherosclerosis, in addition to left main and 2 vessel coronary artery disease. 6. Additional incidental findings, as above.      INTERVAL HISTORY:  Timothy Page is here for a follow up of pancreatic cancer.  Mr. Thieme is here for follow-up  with palliative team. I happened to receive panic value for T bili and hence have made an appointment and saw him in symptom management clinic His pain is not well controlled, he was getting IV pain medication. He is more icteric today. Wife says he has not been eating because of severe pain.He lost lot of weight. He sits still mostly and has not been engaging in much conversation lately when she wonders if this is from the pain.  MEDICAL HISTORY:  Past Medical History:  Diagnosis Date   Arthritis    Atypical chest pain 04/2017   ACS ruled out 05/11/17   CAP (community acquired pneumonia) 05/07/2017   Chronic constipation    at least partially d/t chronic opioids   Diabetes mellitus without complication ([place])    Poor control starting fall 2021-->GAD ab neg.  Insulin and C peptide levels wnl but on lower end of normal->Dr Kuma/endo 2022.   Exocrine pancreatic insufficiency 2022   History of thyroidectomy, total 2018   Hurlthe cell adenoma   Hx of adenomatous polyp of colon 07/02/2009   rpt colonoscopy 2026   Hyperlipidemia    Intol of multiple statins and zetia. Advanced lipid clinic 2022.   Hypertension    Lipoma    back   Myocardial infarction [facility])    MI at age 74   Pancreatic adenocarcinoma ([place]) 06/2020   Mets to liver (?and lungs?->stage 4), w/acute pancreatitis.   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 Cammy Copa   Right ureteral calculus  04/2018   Cystoscopy with right retrograde pyelogram and right ureteral stent placement after stone removal.   Seasonal allergies    Stroke ([place])    in 86O no complications    Tachyarrhythmia 1980   age 65 X 2 over 27 month period.  No recurrence since that time.   Thoracic aortic aneurysm    Incidentally discovered, 4.1 cm-->Dr. Cyndia Bent. Rpt CT 08/2018 and 08/2019 stable.   Vocal cord paralysis 07/2016   Left vocal cord paralysis noted after thyroid surgery.    SURGICAL HISTORY: Past Surgical History:  Procedure Laterality Date   BIOPSY  06/18/2020   Procedure: BIOPSY;  Surgeon: Mansouraty, Telford Nab., MD;  Location: [facility] ENDOSCOPY;  Service: Gastroenterology;;   [place] W/ POLYPECTOMY  07/02/2009; 01/24/20   2011 adenoma.  2021 adenomas x 3->recall 2026   CYSTOSCOPY WITH RETROGRADE PYELOGRAM, URETEROSCOPY AND STENT PLACEMENT Right 05/09/2018   Procedure: CYSTOSCOPY WITH RIGHT RETROGRADE RIGHT URETEROSCOPY STONE EXTRACTION RIGHT STENT EXCHANGE;  Surgeon: Lucas Mallow, MD;  Location: [facility] ORS;  Service: Urology;  Laterality: Right;   CYSTOSCOPY/RETROGRADE/URETEROSCOPY/STONE EXTRACTION WITH BASKET Right 04/30/2018   Procedure: CYSTOSCOPY/RETROGRADE/RIGHT URETEROSCOPY/;  Surgeon: Lucas Mallow, MD;  Location: [facility] ORS;  Service: Urology;  Laterality: Right;   ESOPHAGOGASTRODUODENOSCOPY  07/02/2009   NORMAL   ESOPHAGOGASTRODUODENOSCOPY (EGD) WITH PROPOFOL N/A 06/18/2020   Procedure: ESOPHAGOGASTRODUODENOSCOPY (EGD) WITH PROPOFOL;  Surgeon: Irving Copas., MD;  Location: [facility] ENDOSCOPY;  Service: Gastroenterology;  Laterality: N/A;   EYE SURGERY     FINE NEEDLE ASPIRATION  06/18/2020   Procedure: FINE NEEDLE ASPIRATION (FNA) LINEAR;  Surgeon: Irving Copas., MD;  Location: [place];  Service: Gastroenterology;;   finger amputaion     with reattachement   INCISION AND DRAINAGE ABSCESS ANAL  11/23/2019   gen surg (In ED).   IR IMAGING  GUIDED PORT INSERTION  06/22/2020   LASIK     THYROIDECTOMY N/A 08/03/2016   Procedure: Total THYROIDECTOMY;  Surgeon: [name], MD;  Location: [place];  Service: ENT;  Laterality: N/A;  Total Thyroidectomy    UPPER ESOPHAGEAL ENDOSCOPIC ULTRASOUND (EUS) N/A 06/18/2020   Procedure: UPPER ESOPHAGEAL ENDOSCOPIC ULTRASOUND (EUS);  Surgeon: Irving Copas., MD;  Location: [place];  Service: Gastroenterology;  Laterality: N/A;   UPPER GASTROINTESTINAL ENDOSCOPY     with EUS and bx of pancreatic mass. +appearance of candida in esoph.      I have reviewed the social history and family history with the patient and they are unchanged from previous note.  ALLERGIES:  is allergic to atenolol, hydroxyzine, tetracycline, atorvastatin, codeine, guaifenesin, and mucinex [guaifenesin er].  MEDICATIONS:  Current Outpatient Medications  Medication Sig Dispense Refill   amLODipine (NORVASC) 10 MG tablet Take 1 tablet (10 mg total) by mouth daily. 30 tablet 3   Continuous Blood Gluc Sensor (FREESTYLE LIBRE 2 SENSOR) MISC APPLY 1 SENSOR TO THE SKIN EVERY 14 DAYS AS DIRECTED 2 each 3   docusate sodium (COLACE) 100 MG capsule Take 100 mg by mouth 2 (two) times daily.     ELIQUIS 5 MG TABS tablet TAKE 1 TABLET(5 MG) BY MOUTH TWICE DAILY 60 tablet 1   gabapentin (NEURONTIN) 300 MG capsule Take 1 capsule (300 mg total) by mouth at bedtime. 30 capsule 0   HUMALOG KWIKPEN 100 UNIT/ML KwikPen INJECT SUBCUTANEOUSLY UP TO 12 UNITS BEFORE MEALS 3  TIMES DAILY 45 mL 3   HYDROmorphone (DILAUDID) 2 MG tablet Take 1 tablet (2 mg total) by mouth every 6 (six) hours as needed for severe pain. 90 tablet 0   insulin glargine (LANTUS SOLOSTAR) 100 UNIT/ML Solostar Pen Give up to 10 units under the skin every evening at bedtime. 15 mL 1   Insulin Pen Needle 31G X 5 MM MISC Use with insulin pen 3 times a day 100 each 1   levothyroxine (SYNTHROID) 137 MCG tablet TAKE 1 TABLET BY MOUTH  DAILY BEFORE BREAKFAST (Patient taking  differently: Take 137 mcg by mouth daily before breakfast.) 90 tablet 3   lipase/protease/amylase (CREON) 36000 UNITS CPEP capsule Take 2 capsules (72,000 Units total) by mouth 3 (three) times daily with meals. May also take 1 capsule (36,000 Units total) as needed (with snacks). (Patient not taking: Reported on 02/12/2021) 240 capsule 11   LORazepam (ATIVAN) 0.5 MG tablet TAKE 1 TABLET(0.5 MG) BY MOUTH EVERY 12 HOURS AS NEEDED FOR ANXIETY (Patient taking differently: Take 0.5 mg by mouth every 12 (twelve) hours as needed for anxiety.) 60 tablet 0   magnesium hydroxide (MILK OF MAGNESIA) 400 MG/5ML suspension Take by mouth daily as needed for mild constipation.     morphine (MS CONTIN) 15 MG 12 hr tablet Take 1 tablet (15 mg total) by mouth every 12 (twelve) hours. 60 tablet 0   polyethylene glycol (MIRALAX / GLYCOLAX) 17 g packet Take 17 g by mouth daily.     prochlorperazine (COMPAZINE) 10 MG tablet TAKE 1 TABLET(10  MG) BY MOUTH EVERY 6 HOURS AS NEEDED FOR NAUSEA OR VOMITING (Patient taking differently: Take 10 mg by mouth every 6 (six) hours as needed. TAKE 1 TABLET(10 MG) BY MOUTH EVERY 6 HOURS AS NEEDED FOR NAUSEA OR VOMITING) 30 tablet 1   sorbitol 70 % SOLN Take 15 mLs by mouth daily as needed (for constipation). 473 mL 0   No current facility-administered medications for this visit.   Facility-Administered Medications Ordered in Other Visits  Medication Dose Route Frequency Provider Last Rate Last Admin   HYDROmorphone (DILAUDID) injection 1 mg  1 mg Intravenous Q1H PRN Pickenpack-Cousar, Athena N, NP   1 mg at 03/08/21 1151    PHYSICAL EXAMINATION: ECOG PERFORMANCE STATUS: 2 - Symptomatic, <50% confined to bed  There were no vitals filed for this visit.  Wt Readings from Last 3 Encounters:  03/08/21 164 lb 1 oz (74.4 kg)  03/01/21 174 lb 9.6 oz (79.2 kg)  02/12/21 179 lb 11.2 oz (81.5 kg)     GENERAL:alert, appears cachectic and chronically ill, icterus noted.  He was sitting in  the recliner chair getting pain medication.  LABORATORY DATA:  I have reviewed the data as listed CBC Latest Ref Rng & Units 03/08/2021 03/01/2021 02/24/2021  WBC 4.0 - 10.5 K/uL 2.6(L) 5.7 3.9(L)  Hemoglobin 13.0 - 17.0 g/dL 12.8(L) 11.6(L) 11.2(L)  Hematocrit 39.0 - 52.0 % 37.3(L) 33.8(L) 32.9(L)  Platelets 150 - 400 K/uL 172 239 241     CMP Latest Ref Rng & Units 03/08/2021 03/01/2021 02/24/2021  Glucose 70 - 99 mg/dL 173(H) 207(H) 170(H)  BUN 8 - 23 mg/dL 24(H) 13 13  Creatinine 0.61 - 1.24 mg/dL 0.97 1.01 0.74  Sodium 135 - 145 mmol/L 134(L) 136 138  Potassium 3.5 - 5.1 mmol/L 4.0 3.8 3.4(L)  Chloride 98 - 111 mmol/L 98 102 101  CO2 22 - 32 mmol/L '23 26 26  ' Calcium 8.9 - 10.3 mg/dL 9.6 9.3 9.2  Total Protein 6.5 - 8.1 g/dL 8.0 7.2 -  Total Bilirubin 0.3 - 1.2 mg/dL 7.7(HH) 1.8(H) -  Alkaline Phos 38 - 126 U/L 386(H) 204(H) -  AST 15 - 41 U/L 173(HH) 80(H) -  ALT 0 - 44 U/L 273(H) 81(H) -    RADIOGRAPHIC STUDIES: I have personally reviewed the radiological images as listed and agreed with the findings in the report.  Reviewed pertinent imaging reports from recent hospitalization.  All questions were answered. The patient knows to call the clinic with any problems, questions or concerns. No barriers to learning was detected. The total time spent in the appointment was 40 minutes. I have spent most of the time today discussing about goals of care, trying to coordinate with radiology to review the MRI images and with our palliative care team.    [name], MD 03/08/2021

## 2021-03-08 NOTE — Progress Notes (Signed)
Timothy Page  [phone] [phone]   Name: Timothy Page Date: 03/08/2021 MRN: [id number]  DOB: 05-17-1955  Patient Care Team: [name], MD as PCP - General (Hematology and Oncology) Gatha Mayer, MD as Consulting Physician (Gastroenterology) [name], MD as Consulting Physician (Otolaryngology) Elayne Snare, MD as Consulting Physician (Endocrinology) Gaye Pollack, MD as Consulting Physician (Cardiothoracic Surgery) Lucas Mallow, MD as Consulting Physician (Urology) Debara Pickett Nadean Corwin, MD as Consulting Physician (Cardiology) [name], MD as Consulting Physician (Hematology and Oncology) Jonnie Finner, RN (Inactive) as Oncology Nurse Navigator Pickenpack-Cousar, Carlena Sax, NP as Nurse Practitioner (Nurse Practitioner)    INTERVAL HISTORY: Timothy Page is a 66 y.o. male with stage IV pancreatic cancer with mets to the lung and liver (currently on second line treatment with gemcitabine and Xeloda), chemo induced neuropathy, diabetes, hypothyroidism, and anxiety.  Palliative ask to see for symptom management and goals of care.     SOCIAL HISTORY:     reports that he has never smoked. Timothy smokeless tobacco use includes snuff. He reports that he does not drink alcohol and does not use drugs.  ADVANCE DIRECTIVES:  None on file  CODE STATUS: DNR  PAST MEDICAL HISTORY: Past Medical History:  Diagnosis Date   Arthritis    Atypical chest pain 04/2017   ACS ruled out 05/11/17   CAP (community acquired pneumonia) 05/07/2017   Chronic constipation    at least partially d/t chronic opioids   Diabetes mellitus without complication ([place])    Poor control starting fall 2021-->GAD ab neg.  Insulin and C peptide levels wnl but on lower end of normal->Dr Kuma/endo 2022.   Exocrine pancreatic insufficiency 2022   History of thyroidectomy, total 2018   Hurlthe cell adenoma   Hx of adenomatous polyp  of colon 07/02/2009   rpt colonoscopy 2026   Hyperlipidemia    Intol of multiple statins and zetia. [facility] 2022.   Hypertension    Lipoma    back   Myocardial infarction [facility])    MI at age 34   Pancreatic adenocarcinoma ([place]) 06/2020   Mets to liver (?and lungs?->stage 4), w/acute pancreatitis.   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 Cammy Copa   Right ureteral calculus 04/2018   Cystoscopy with right retrograde pyelogram and right ureteral stent placement after stone removal.   Seasonal allergies    Stroke ([place])    in 33H no complications    Tachyarrhythmia 1980   age 23 X 2 over 45 month period.  No recurrence since that time.   Thoracic aortic aneurysm    Incidentally discovered, 4.1 cm-->Dr. Cyndia Bent. Rpt CT 08/2018 and 08/2019 stable.   Vocal cord paralysis 07/2016   Left vocal cord paralysis noted after thyroid surgery.    ALLERGIES:  is allergic to atenolol, hydroxyzine, tetracycline, atorvastatin, codeine, guaifenesin, and mucinex [guaifenesin er].  MEDICATIONS:  Current Outpatient Medications  Medication Sig Dispense Refill   oxyCODONE (OXY IR/ROXICODONE) 5 MG immediate release tablet Take 1 to 2 tablets by mouth every 4  hours as needed for severe pain, moderate pain or breakthrough pain. 120 tablet 0   amLODipine (NORVASC) 10 MG tablet Take 1 tablet (10 mg total) by mouth  daily. 30 tablet 3   Continuous Blood Gluc Sensor (FREESTYLE LIBRE 2 SENSOR) MISC APPLY 1 SENSOR TO THE SKIN EVERY 14 DAYS AS DIRECTED 2 each 3   docusate sodium (COLACE) 100 MG capsule Take 100 mg by mouth 2 (two) times daily.     ELIQUIS 5 MG TABS tablet TAKE 1 TABLET(5 MG) BY MOUTH TWICE DAILY 60 tablet 1   gabapentin (NEURONTIN) 300 MG capsule Take 1 capsule (300 mg  total) by mouth at bedtime. 30 capsule 0   HUMALOG KWIKPEN 100 UNIT/ML KwikPen INJECT SUBCUTANEOUSLY UP TO 12 UNITS BEFORE MEALS 3  TIMES DAILY 45 mL 3   insulin glargine (LANTUS SOLOSTAR) 100 UNIT/ML Solostar Pen Give up to 10 units under the skin every evening at bedtime. 15 mL 1   Insulin Pen Needle 31G X 5 MM MISC Use with insulin pen 3 times a day 100 each 1   levothyroxine (SYNTHROID) 137 MCG tablet TAKE 1 TABLET BY MOUTH  DAILY BEFORE BREAKFAST (Patient taking differently: Take 137 mcg by mouth daily before breakfast.) 90 tablet 3   lipase/protease/amylase (CREON) 36000 UNITS CPEP capsule Take 2 capsules (72,000 Units total) by mouth 3 (three) times daily with meals. May also take 1 capsule (36,000 Units total) as needed (with snacks). (Patient not taking: Reported on 02/12/2021) 240 capsule 11   LORazepam (ATIVAN) 0.5 MG tablet TAKE 1 TABLET(0.5 MG) BY MOUTH EVERY 12 HOURS AS NEEDED FOR ANXIETY (Patient taking differently: Take 0.5 mg by mouth every 12 (twelve) hours as needed for anxiety.) 60 tablet 0   magnesium hydroxide (MILK OF MAGNESIA) 400 MG/5ML suspension Take by mouth daily as needed for mild constipation.     morphine (MS CONTIN) 15 MG 12 hr tablet Take 2 tablets (30 mg total) by mouth every 12 (twelve) hours. 60 tablet 0   polyethylene glycol (MIRALAX / GLYCOLAX) 17 g packet Take 17 g by mouth daily.     prochlorperazine (COMPAZINE) 10 MG tablet TAKE 1 TABLET(10 MG) BY MOUTH EVERY 6 HOURS AS NEEDED FOR NAUSEA OR VOMITING (Patient taking differently: Take 10 mg by mouth every 6 (six) hours as needed. TAKE 1 TABLET(10 MG) BY MOUTH EVERY 6 HOURS AS NEEDED FOR NAUSEA OR VOMITING) 30 tablet 1   sorbitol 70 % SOLN Take 15 mLs by mouth daily as needed (for constipation). 473 mL 0   No current facility-administered medications for this visit.   Facility-Administered Medications Ordered in Other Visits  Medication Dose Route Frequency Provider Last Rate Last Admin   HYDROmorphone  (DILAUDID) injection 1 mg  1 mg Intravenous Q1H PRN Pickenpack-Cousar, Carlena Sax, NP   1 mg at 03/08/21 1151    VITAL SIGNS: BP 131/87 (BP Location: Right Arm, Patient Position: Sitting)    Pulse (!) 102    Temp 97.8 F (36.6 C) (Oral)    Resp 18    Wt 164 lb 1 oz (74.4 kg)    SpO2 99%    BMI 24.23 kg/m  Filed Weights   03/08/21 0944  Weight: 164 lb 1 oz (74.4 kg)    Estimated body mass index is 24.23 kg/m as calculated from the following:   Height as of 02/12/21: 5\' 9"  (1.753 m).   Weight as of this encounter: 164 lb 1 oz (74.4 kg).   PERFORMANCE STATUS (ECOG) : 1 - Symptomatic but completely ambulatory   Physical Exam General: weak appearing, jaundice HEENT: Sclera icterus  Cardiovascular:Tachycardic Pulmonary: clear ant fields, normal breathing pattern  Abdomen: soft, tender, +  bowel sounds Extremities: no edema, no joint deformities Skin: Jaundice  Neurological: Weakness but otherwise nonfocal  IMPRESSION:  Timothy Page and Timothy Page presents to the clinic today after calling and expressing concerns with increased pain, little to no appetite, weight loss, and jaundice.   CBC showed WBC 2.6. CMP resulted Na+ 134, BUN 24, AST 173 (up from 80 on 1/16), AST 273 (81 on 1/16), bilirubin 7.7 (1.8 on 1/16). Labs reviewed and discussed with Dr. Chryl Heck as well as patient and Timothy Page.   Pain related to neoplasm Timothy Page expresses Timothy pain has become intolerable with no relief despite home regimen. He has been taking MS Contin 15 mg BID and Oxy IR 5mg  every 6hrs for breakthrough. He felt the Oxycontin and Dilaudid was making him to drowsy and has not taken them in some time since we changed regimen. He does not wish to continue to have uncontrolled pain as this has greatly impacted Timothy quality of life. Timothy Page shares concerns of Timothy distress. We will access Timothy port and administer IVF and IV pain medications for symptom management.   Prior to discharging home we discussed at length pain  regimen. Goals are to focus on comfort and aggressive symptom management with hopes of some ability to function and spend time with Timothy family. We will increase his MS Contin to 30 mg and Oxy IR to 5-15 mg for breakthrough. He knows if Timothy pain continues to be uncontrolled to call and we will adjust or see him in the office accordingly until he is admitted to hospice.   Poor appetite/weight loss Continues to have minimal appetite with only a few small sips and bites occasionally. He shares he has been trying to increase Timothy protein but the pain and fullness feeling is limiting Timothy ability to do so. Timothy weigh tis down 10lbs to 164.1lb compared to previous weight of 174.9lb on 1/16.   Jaundice Unfortunately there is concern of cancer advancement and most likely some form of obstruction related to Timothy cancer. He recently had an MRI which Dr. Chryl Heck reviewed with he and Timothy Page. Due to the nature of Timothy cancer further treatment options or surgical interventions are  not an option. I did discuss with Dr. Rob Hickman support with patient the option of being evaluated in the ED for further work-up and possible stent placement although not likely. We discussed goals of care and if stent was an option it would not be a long-term solution and the malignant obstruction would continue to be of concern. Timothy Page and Timothy Page verbalized understanding. He expressed there is no need to put a "band-aid" on a bigger and un resolvable problem. Emotional support provided. He is declining further ED evaluation and wishes to return home with Timothy family.   Goals of care  Dr. Chryl Heck and I had a detailed discussion regarding Timothy current illness and what it means in the larger context of Her on-going co-morbidities. Natural disease trajectory and expectations were discussed. Timothy Page and Timothy Page understands due to significant symptom burden and signs of progression there are not any further available options with recommendations to  focus on Timothy comfort and quality of life for what time he has left. They are understandably emotional. Support provided.   We discussed outpatient hospice support and their philosophy of care. Education provided on ongoing symptom management and needs in the home. I also introduced the hospice home in the event symptoms become unmanageable in the home. They verbalized understanding  and appreciation.   We discussed different hospice agencies available and they have requested [facility]. Education provided on the referral process with understanding Timothy team here at the [facility] are available for support.   He has confirmed DNR/DNI.   PLAN: Timothy Page received IV Dilaudid and IVF while in the clinic today. Reports pain is much better with a level of 4/10. He shares this has been the most restful and peaceful day he has had in some time.  Increase MS Contin to 30 mg BID Oxy IR 5-10mg  every 3-4 hrs as needed for breakthrogh pain Compazine as needed for nausea Ativan as needed for anxiety Long discussion today with myself and Dr. Chryl Heck. Patient has agreed to outpatient hospice support. Referral sent to [facility] as requested.  Patient knows to contact our office with any further concerns or needs. No follow-up to be scheduled at this time.    Patient and Page expressed understanding and was in agreement with this plan. He also understands that He can call the clinic at any time with any questions, concerns, or complaints.   Time Total: 135 min  Visit consisted of counseling and education dealing with the complex and emotionally intense issues of symptom management and palliative care in the setting of serious and potentially life-threatening illness.Greater than 50%  of this time was spent counseling and coordinating care related to the above assessment and plan.  Signed by: Alda Lea, AGPCNP-BC [place]

## 2021-03-09 ENCOUNTER — Telehealth: Payer: Self-pay

## 2021-03-09 ENCOUNTER — Other Ambulatory Visit: Payer: Self-pay | Admitting: Hematology and Oncology

## 2021-03-09 LAB — CANCER ANTIGEN 19-9: CA 19-9: 229 U/mL — ABNORMAL HIGH (ref 0–35)

## 2021-03-09 NOTE — Progress Notes (Unsigned)
° °  Referral was received for hospice services at home from [facility]. We have worked this referral up and the pt has been approved for hospice services at home.   We have spoke to the pt's wife and will go to home to do intake visit for hospice care 03/10/21 200pm per her request.   Thank you for this referral. Please call if you have any questions.  Webb Silversmith RN

## 2021-03-09 NOTE — Progress Notes (Signed)
Timothy Page came to the Palliative Care office with complaints of left lower abdominal pain rating it an 8/10. Per Lexine Baton, NP I gave Mr. Ausborn 1mg  of Dilaudid IV. He stated that the pain went down a little but was still there. Lexine Baton, NP ordered 2mg  of Dilaudid IV and I gave this to Mr. Wien, who then stated his pain went down to a 4/10. Per provider, 2mg  of Dilaudid IV given before Mr. Villamor left, as his pain began to increase. Mr. Jipson waited after pain medication was given and was deaccessed by Lexine Baton, NP. Pain controlled. Advised to call our office with any questions/concerns. Understanding and appreciation verbalized.

## 2021-03-09 NOTE — Telephone Encounter (Signed)
I called Vanetta Mulders, Mr. Dinse's wife, to see how his pain was doing today. She stated that the increased dose of Morphine helped him last night and he didn't have to take an Oxycodone until this morning. She said that he was currently resting comfortably. I explained to her that I would call [facility] to make sure they received our referral and that they would be contacting her with further information. Understanding and gratitude verbalized. Advised to call our office with any questions/concerns.  I reached out to [facility] and explained Mr. Bellis situation and the referral and they said they would begin working on it.

## 2021-03-09 NOTE — Progress Notes (Signed)
I called Timothy Page, he didn't answer, called Timothy Page. Discussed with Timothy Page about considering external biliary drain which was recommended by radiology. Timothy Page mentioned that Timothy Radigan wasn't quite interested in this procedures, they are going to meet hospice team in a couple days In Timothy Ouderkirk's words, these procedures are like putting a bandaid , because there are not ton of options to treat the cancer, more concerning thing is his deteriorating performance status and severe pain with poor quality of life. He for now is going to proceed with hospice. I asked them to let us know asap if he changes his mind.  Sadira Standard

## 2021-03-11 ENCOUNTER — Other Ambulatory Visit (HOSPITAL_COMMUNITY): Payer: Self-pay

## 2021-03-11 ENCOUNTER — Encounter: Payer: Self-pay | Admitting: Hematology and Oncology

## 2021-03-11 MED ORDER — POLYETHYLENE GLYCOL 3350 17 GM/SCOOP PO POWD: ORAL | 3 refills | Status: AC

## 2021-03-11 MED ORDER — PROCHLORPERAZINE MALEATE 10 MG PO TABS
ORAL_TABLET | ORAL | 0 refills | Status: AC
  Filled 2021-03-11: qty 30, 8d supply, fill #0

## 2021-03-11 MED ORDER — POLYETHYLENE GLYCOL 3350 17 GM/SCOOP PO POWD
ORAL | 0 refills | Status: AC
  Filled 2021-03-11: qty 476, 30d supply, fill #0
  Filled 2021-03-16: qty 476, 28d supply, fill #0

## 2021-03-12 ENCOUNTER — Encounter: Payer: Self-pay | Admitting: Hematology and Oncology

## 2021-03-12 ENCOUNTER — Other Ambulatory Visit (HOSPITAL_COMMUNITY): Payer: Self-pay

## 2021-03-15 ENCOUNTER — Inpatient Hospital Stay: Payer: 59 | Admitting: Hematology and Oncology

## 2021-03-15 ENCOUNTER — Inpatient Hospital Stay: Payer: 59 | Admitting: Nurse Practitioner

## 2021-03-15 ENCOUNTER — Telehealth: Payer: Self-pay

## 2021-03-15 NOTE — Telephone Encounter (Signed)
I called Timothy Page, Mr. Looper's wife, to let her know that they didn't have to come to their appts today, since he is enrolled in Hospice, but if they needed to, they could. She stated that she had called to cancel them last week, but they hadn't been and that they were doing well. She stated Mr. Winterhalter is sleeping throughout the day and that the Hospice nurse has been coming by to see him. She notified us of a form she sent through MyChart to be filled out for his work and I informed her that we would fill that out for them. Understanding verbalized. All questions answered. Advised to call our office with any questions/concerns.

## 2021-03-16 ENCOUNTER — Other Ambulatory Visit (HOSPITAL_COMMUNITY): Payer: Self-pay

## 2021-03-19 ENCOUNTER — Other Ambulatory Visit (HOSPITAL_COMMUNITY): Payer: Self-pay

## 2021-03-19 MED ORDER — HALOPERIDOL 1 MG PO TABS
ORAL_TABLET | ORAL | 0 refills | Status: AC
  Filled 2021-03-19: qty 30, 5d supply, fill #0

## 2021-03-22 ENCOUNTER — Other Ambulatory Visit (HOSPITAL_COMMUNITY): Payer: Self-pay

## 2021-03-22 MED ORDER — MORPHINE SULFATE ER 15 MG PO TBCR
EXTENDED_RELEASE_TABLET | ORAL | 0 refills | Status: AC
  Filled 2021-03-22: qty 135, 15d supply, fill #0

## 2021-03-22 MED ORDER — OXYCODONE HCL 15 MG PO TABS
ORAL_TABLET | ORAL | 0 refills | Status: AC
  Filled 2021-03-22: qty 30, 4d supply, fill #0

## 2021-03-24 ENCOUNTER — Other Ambulatory Visit (HOSPITAL_COMMUNITY): Payer: Self-pay

## 2021-03-27 ENCOUNTER — Other Ambulatory Visit (HOSPITAL_COMMUNITY): Payer: Self-pay

## 2021-03-27 MED ORDER — HYDROMORPHONE HCL 2 MG PO TABS
ORAL_TABLET | ORAL | 0 refills | Status: AC
  Filled 2021-03-27: qty 40, 3d supply, fill #0

## 2021-03-30 ENCOUNTER — Other Ambulatory Visit (HOSPITAL_COMMUNITY): Payer: Self-pay

## 2021-03-30 ENCOUNTER — Encounter: Payer: Self-pay | Admitting: Hematology and Oncology

## 2021-04-02 ENCOUNTER — Other Ambulatory Visit (HOSPITAL_COMMUNITY): Payer: Self-pay

## 2021-04-09 ENCOUNTER — Encounter: Payer: Self-pay | Admitting: Hematology and Oncology

## 2021-04-14 DEATH — deceased

## 2021-07-21 ENCOUNTER — Other Ambulatory Visit: Payer: Self-pay | Admitting: Surgery

## 2021-07-21 DIAGNOSIS — I7121 Aneurysm of the ascending aorta, without rupture: Secondary | ICD-10-CM

## 2021-09-08 ENCOUNTER — Encounter: Payer: Self-pay | Admitting: Hematology and Oncology

## 2021-09-15 ENCOUNTER — Ambulatory Visit: Payer: Self-pay | Admitting: Surgery

## 2021-09-15 ENCOUNTER — Inpatient Hospital Stay: Admission: RE | Admit: 2021-09-15 | Payer: 59 | Source: Ambulatory Visit

## 2022-12-08 IMAGING — CT CT ABD-PELV W/O CM
2 of 4 series · 16 of 46 positions shown, 18 images · non-contrast
Comparison: 07/06/2020

CLINICAL DATA: Lower abdominal pain for several days

EXAM:
CT ABDOMEN AND PELVIS WITHOUT CONTRAST
TECHNIQUE: Multidetector CT imaging of the abdomen and pelvis was performed
following the standard protocol without IV contrast.

[Series 3: a/p w/o 5mm · axial · non-contrast · 0.80mm/px · z∈[+765,+1210]mm · 13 of 99 slices shown, 15 images]
[im 5/99  soft-tissue]
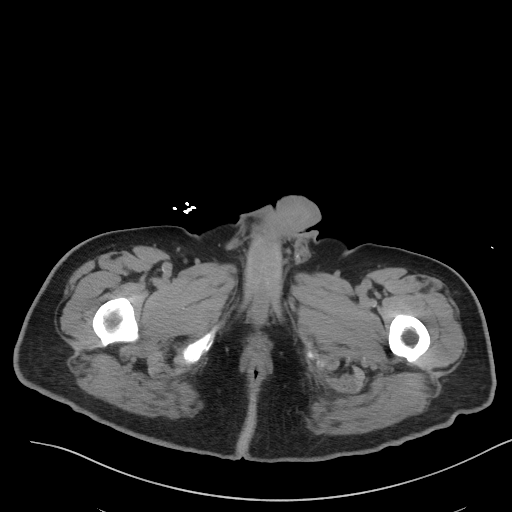
[im 5/99  bone]
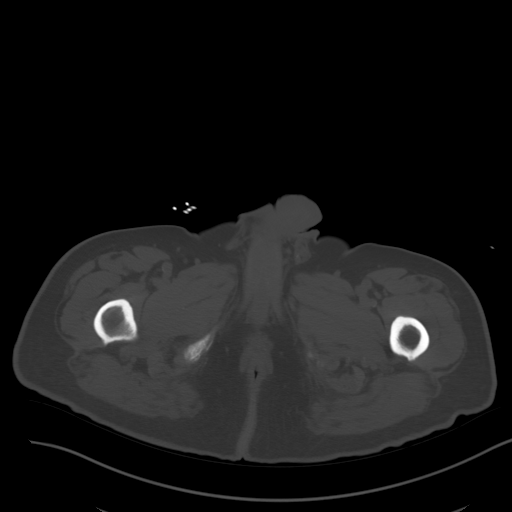
[im 13/99  soft-tissue]
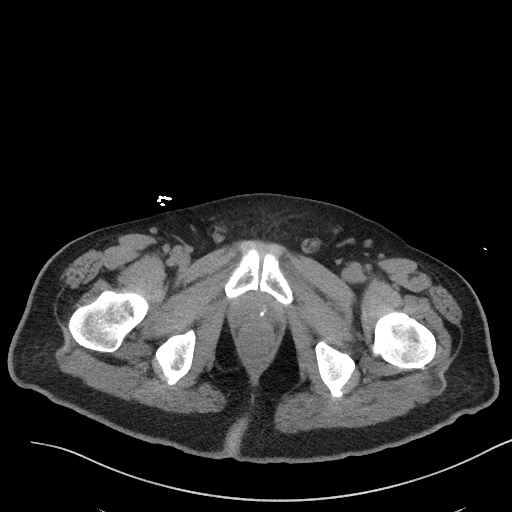
[im 21/99  soft-tissue]
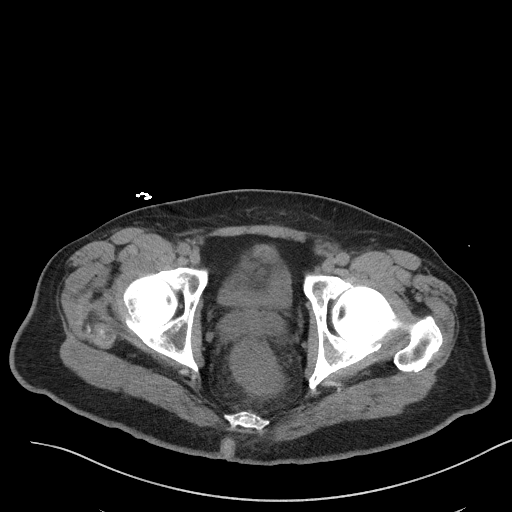
[im 29/99  soft-tissue]
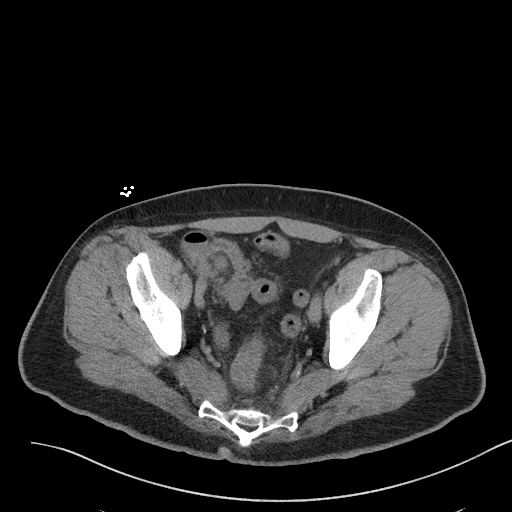
[im 33/99  soft-tissue]
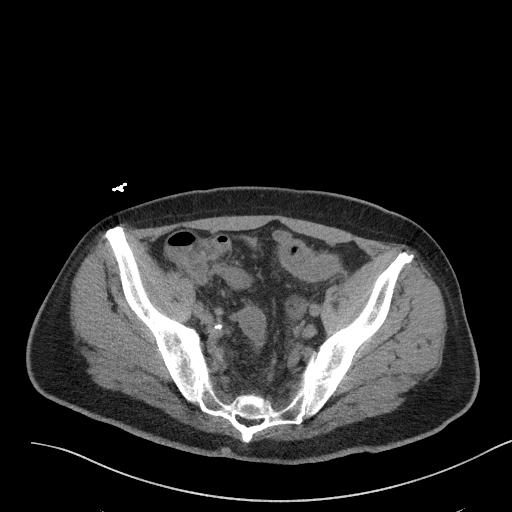
[im 41/99  soft-tissue]
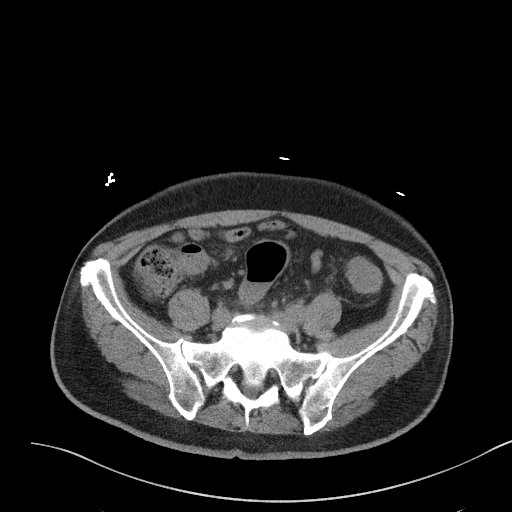
[im 50/99  soft-tissue]
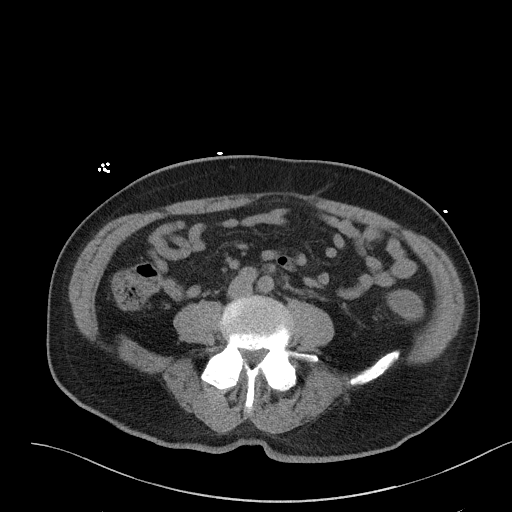
[im 58/99  soft-tissue]
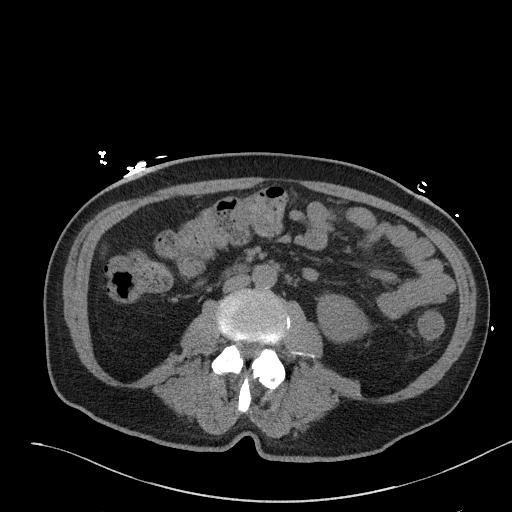
[im 66/99  soft-tissue]
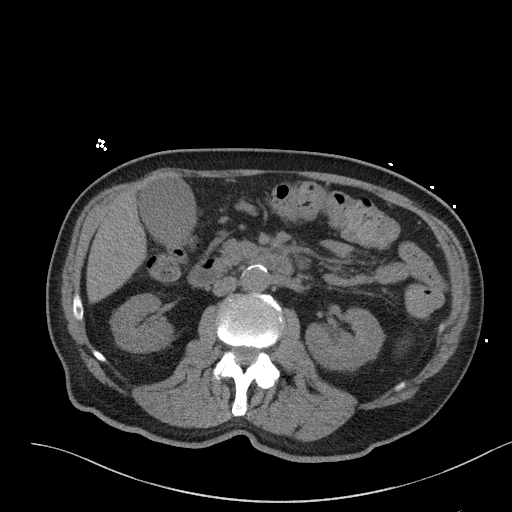
[im 66/99  bone]
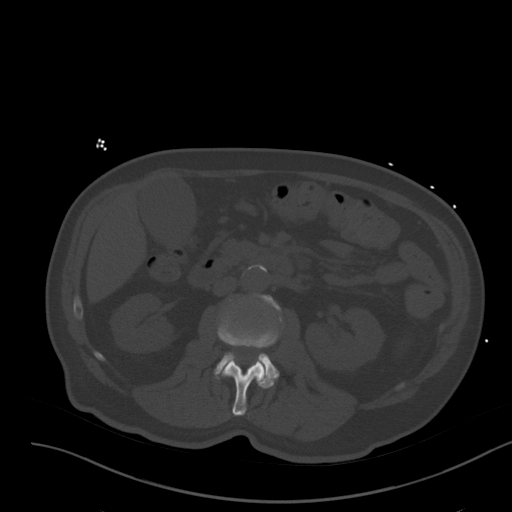
[im 70/99  soft-tissue]
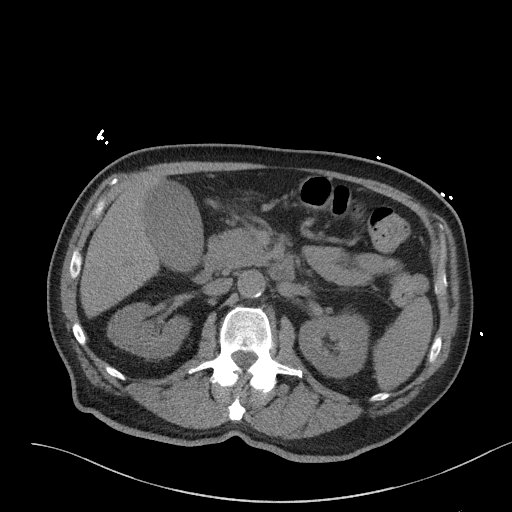
[im 78/99  soft-tissue]
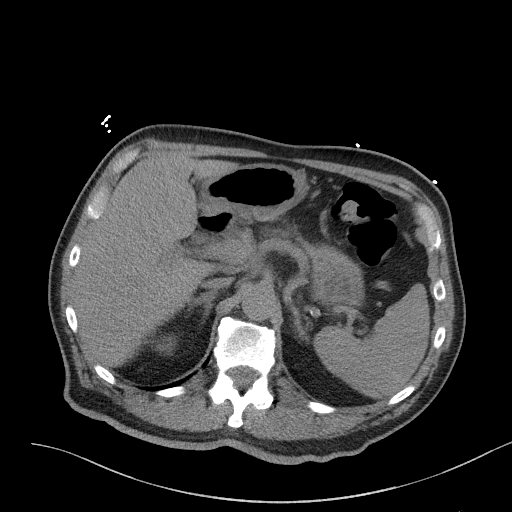
[im 86/99  soft-tissue]
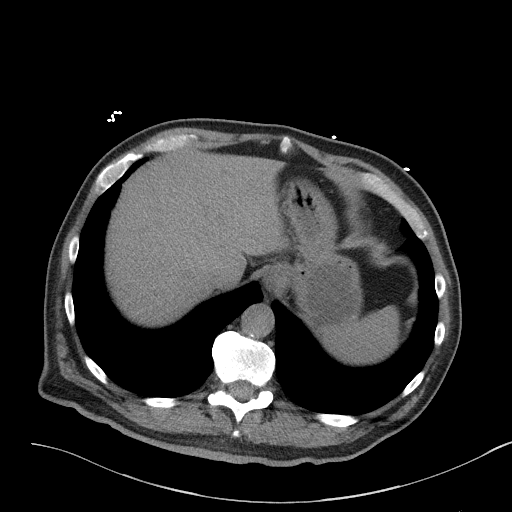
[im 94/99  soft-tissue]
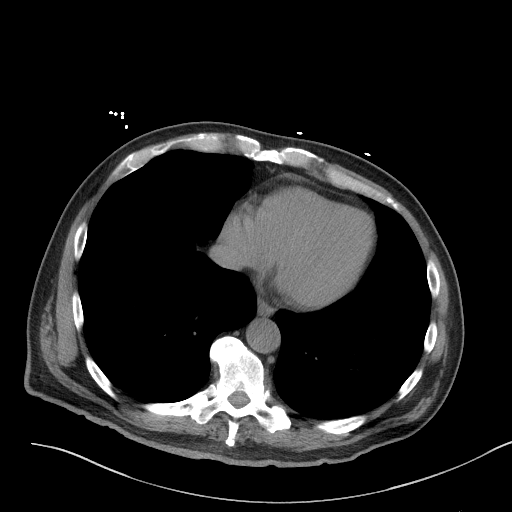

[Series 6: a/p w/o cor · coronal · non-contrast · 0.86mm/px · 3 of 150 slices shown]
[im 50/150  soft-tissue]
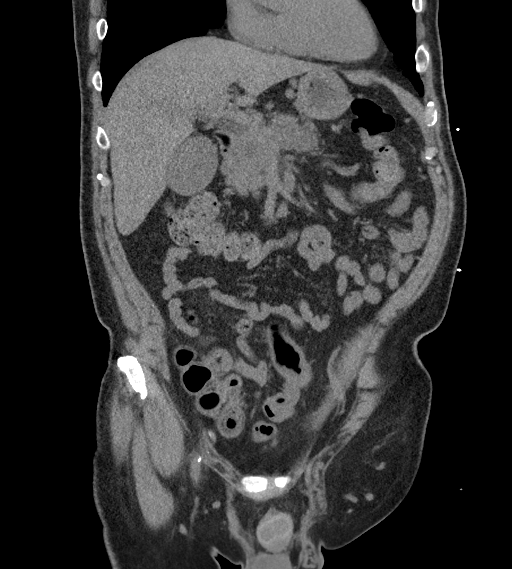
[im 67/150  soft-tissue]
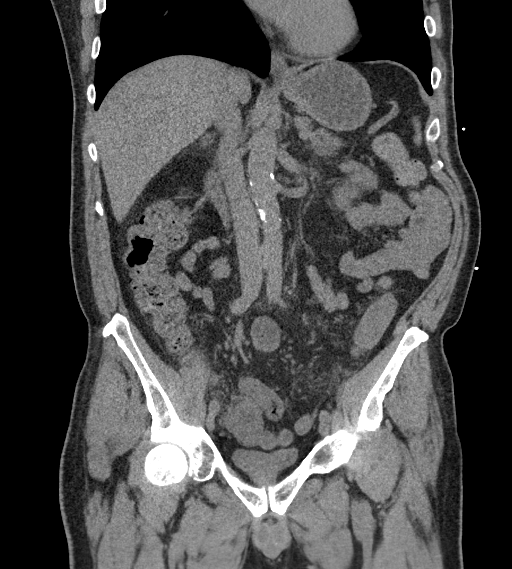
[im 83/150  soft-tissue]
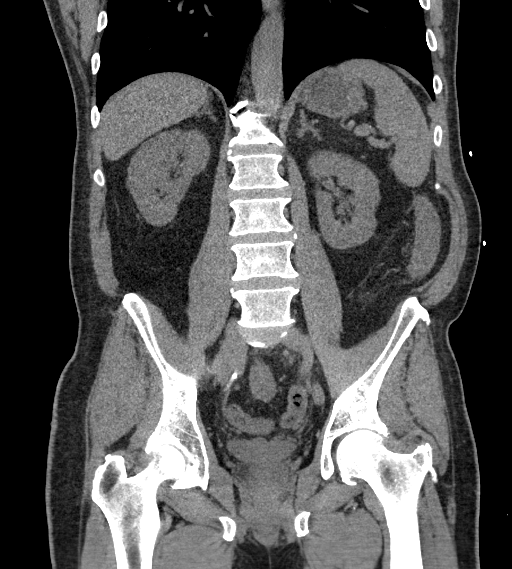

[16 of 46 positions shown; findings below may reference images not displayed]

FINDINGS: Lower chest: Lung bases are free of acute infiltrate or sizable
effusion. Previously seen pulmonary nodules are not within the field
of view.

Hepatobiliary: Liver is within normal limits. Gallbladder is well
distended without wall thickening or pericholecystic fluid.

Pancreas: Fullness in the region of the pancreatic head is again
seen and stable. Focal hypodensity is noted. Pancreatic duct is
dilated but stable in appearance.

Spleen: Normal in size without focal abnormality.

Adrenals/Urinary Tract: Adrenal glands are within normal limits.
Kidneys are well visualized bilaterally. No renal calculi or
obstructive changes are seen. Bladder is decompressed.

Stomach/Bowel: New rectal wall thickening and perirectal
inflammatory changes are noted. These inflammatory changes extend
superiorly into the sigmoid and descending colon. Transverse colon
and ascending colon appear within normal limits. The appendix is
within normal limits. No small bowel or gastric abnormality is
noted.

Vascular/Lymphatic: Atherosclerotic calcifications are noted. No
sizable lymphadenopathy is noted.

Reproductive: Prostate is unremarkable.

Other: No abdominal wall hernia or abnormality. No abdominopelvic
ascites.

Musculoskeletal: No acute or significant osseous findings.
Degenerative changes are noted.
IMPRESSION: New colonic wall thickening and pericolonic inflammatory changes
involving the distal colon from the mid descending portion to the
level of the anus. These changes are most consistent with focal
colitis. No abscess or perforation is noted.

Stable changes in the region of the head of the pancreas similar to
that seen on prior exam.

## 2023-01-04 NOTE — Telephone Encounter (Signed)
Telephone call  

## 2024-01-12 ENCOUNTER — Other Ambulatory Visit (HOSPITAL_COMMUNITY): Payer: Self-pay
